# Patient Record
Sex: Female | Born: 1990
Health system: Southern US, Community
[De-identification: ages and names within clinical notes are randomized; demographics above are authoritative.]

## PROBLEM LIST (undated history)

## (undated) ENCOUNTER — Emergency Department (HOSPITAL_COMMUNITY): Admission: EM | Disposition: A | Discharge status: Other Acute Inpatient Hospital | Payer: Medicaid Other

## (undated) ENCOUNTER — Ambulatory Visit (HOSPITAL_COMMUNITY): Admission: EM

## (undated) DIAGNOSIS — K449 Diaphragmatic hernia without obstruction or gangrene: Secondary | ICD-10-CM

## (undated) DIAGNOSIS — R109 Unspecified abdominal pain: Secondary | ICD-10-CM

## (undated) DIAGNOSIS — E669 Obesity, unspecified: Secondary | ICD-10-CM

## (undated) DIAGNOSIS — R011 Cardiac murmur, unspecified: Secondary | ICD-10-CM

## (undated) DIAGNOSIS — N911 Secondary amenorrhea: Secondary | ICD-10-CM

## (undated) DIAGNOSIS — K219 Gastro-esophageal reflux disease without esophagitis: Secondary | ICD-10-CM

## (undated) DIAGNOSIS — J45909 Unspecified asthma, uncomplicated: Secondary | ICD-10-CM

## (undated) DIAGNOSIS — O1490 Unspecified pre-eclampsia, unspecified trimester: Secondary | ICD-10-CM

## (undated) DIAGNOSIS — D649 Anemia, unspecified: Secondary | ICD-10-CM

## (undated) DIAGNOSIS — M25579 Pain in unspecified ankle and joints of unspecified foot: Secondary | ICD-10-CM

## (undated) DIAGNOSIS — Z3009 Encounter for other general counseling and advice on contraception: Secondary | ICD-10-CM

## (undated) DIAGNOSIS — F419 Anxiety disorder, unspecified: Secondary | ICD-10-CM

## (undated) HISTORY — DX: Unspecified abdominal pain: R10.9

## (undated) HISTORY — DX: Anemia, unspecified: D64.9

## (undated) HISTORY — PX: TONSILLECTOMY AND ADENOIDECTOMY: SUR1326

## (undated) HISTORY — DX: Obesity, unspecified: E66.9

## (undated) HISTORY — DX: Gastro-esophageal reflux disease without esophagitis: K21.9

## (undated) HISTORY — PX: ADENOIDECTOMY: SUR15

## (undated) HISTORY — DX: Diaphragmatic hernia without obstruction or gangrene: K44.9

---
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
## 2011-02-17 ENCOUNTER — Ambulatory Visit: Payer: Self-pay | Admitting: Pediatrics

## 2011-02-20 LAB — URINALYSIS, ROUTINE W REFLEX MICROSCOPIC
Bilirubin Urine: NEGATIVE
Nitrite: NEGATIVE
Specific Gravity, Urine: 1.015 (ref 1.005–1.030)
pH: 7 (ref 5.0–8.0)

## 2011-02-20 LAB — CBC
HCT: 30.6 % — ABNORMAL LOW (ref 36.0–46.0)
MCV: 79.2 fL (ref 78.0–100.0)
Platelets: 252 10*3/uL (ref 150–400)
RBC: 3.51 MIL/uL — ABNORMAL LOW (ref 3.87–5.11)
RDW: 20.2 % — ABNORMAL HIGH (ref 11.5–15.5)
WBC: 8.1 10*3/uL (ref 4.0–10.5)
WBC: 8.4 10*3/uL (ref 4.0–10.5)

## 2011-02-20 LAB — URINE MICROSCOPIC-ADD ON

## 2011-02-20 LAB — URINE CULTURE

## 2011-02-26 LAB — PREGNANCY, URINE: Preg Test, Ur: POSITIVE

## 2011-02-27 ENCOUNTER — Ambulatory Visit (INDEPENDENT_AMBULATORY_CARE_PROVIDER_SITE_OTHER): Payer: Medicaid Other | Admitting: Family Medicine

## 2011-02-27 ENCOUNTER — Encounter: Payer: Self-pay | Admitting: Family Medicine

## 2011-02-27 VITALS — BP 110/70 | Ht 63.5 in | Wt 279.0 lb

## 2011-02-27 DIAGNOSIS — Z6841 Body Mass Index (BMI) 40.0 and over, adult: Secondary | ICD-10-CM | POA: Insufficient documentation

## 2011-02-27 DIAGNOSIS — E669 Obesity, unspecified: Secondary | ICD-10-CM | POA: Insufficient documentation

## 2011-02-27 DIAGNOSIS — D649 Anemia, unspecified: Secondary | ICD-10-CM | POA: Insufficient documentation

## 2011-02-27 DIAGNOSIS — R300 Dysuria: Secondary | ICD-10-CM

## 2011-02-27 HISTORY — DX: Body Mass Index (BMI) 40.0 and over, adult: Z684

## 2011-02-27 LAB — CBC
HCT: 32 % — ABNORMAL LOW (ref 36.0–46.0)
Hemoglobin: 10.3 g/dL — ABNORMAL LOW (ref 12.0–15.0)
MCH: 23.5 pg — ABNORMAL LOW (ref 26.0–34.0)
MCHC: 32.2 g/dL (ref 30.0–36.0)
RBC: 4.38 MIL/uL (ref 3.87–5.11)

## 2011-02-27 LAB — POCT URINALYSIS DIPSTICK
Bilirubin, UA: NEGATIVE
Blood, UA: NEGATIVE
Ketones, UA: NEGATIVE
pH, UA: 6.5

## 2011-02-27 LAB — BASIC METABOLIC PANEL
CO2: 25 mEq/L (ref 19–32)
Calcium: 9.1 mg/dL (ref 8.4–10.5)
Chloride: 102 mEq/L (ref 96–112)
Glucose, Bld: 92 mg/dL (ref 70–99)
Sodium: 137 mEq/L (ref 135–145)

## 2011-02-27 LAB — WET PREP, GENITAL
Trich, Wet Prep: NONE SEEN
Yeast Wet Prep HPF POC: NONE SEEN

## 2011-02-27 NOTE — Patient Instructions (Signed)
Please come back in 3-4 weeks for lab follow up.

## 2011-02-28 ENCOUNTER — Encounter: Payer: Self-pay | Admitting: Family Medicine

## 2011-02-28 DIAGNOSIS — R300 Dysuria: Secondary | ICD-10-CM | POA: Insufficient documentation

## 2011-02-28 NOTE — Progress Notes (Signed)
  Subjective:    Patient ID: Veronica [name], female    DOB: 06-Jun-1991, 20 y.o.   MRN: [id number]  HPI This is New Patient visit.   PMH, PSH, FH, SH have been reviewed.  Pt is an 20 y/o G1P1 who presents with her mother.  Her son, Veronica [name] (08/2010), became my patient after he was discharged from newborn nursery.  Pt was followed by Dr Gaynell Face during her pregnancy and has seen him for postpartal care.  Mom is concerned about the dark skin on neck because she has diabetes and has the same skin pigmentation.  Pt states that during her pregnancy her sugars were always normal and she did not have diagnosis of gestational dm.   Mom states that pt has been sleeping a lot during the day.  Besides taking care Veronica she is not doing much else.  Veronica [name] plans to attend [employer] to get her GED, but she has not resumed school at this time.   Currently there is a big stressor in their life:  Veronica' father, Anitra Lauth, would like to have joint custody.  Mom and [name] are concerned that Veronica [name] cannot take care of Veronica since he has ADHD and Bipolar disorder.  Veronica [name] currently lives with his mother.    Dysuria: Pt complains of a few days of dysuria.  No frequency or urgency.  Hematuria.      Review of Systems Pt denies fever, chills, cough, chest pain, abd pain, dysuria, incontinence, depression.      Objective:   Physical Exam   General:  Obese,well-nourished,in no acute distress; alert,appropriate and cooperative throughout examination. Vitals reviewed.  Head:  normocephalic and atraumatic.   Mouth:  Oral mucosa and oropharynx without lesions or exudates.   Neck:  No deformities, masses, or tenderness noted. Lungs:  Normal respiratory effort, chest expands symmetrically. Lungs are clear to auscultation, no crackles or wheezes. Heart:  Normal rate and regular rhythm. S1 and S2 normal without gallop, murmur, click, rub or other extra sounds.   Abdomen:  Obese. Bowel sounds positive,abdomen  soft and non-tender without masses, organomegaly or hernias noted. Msk:  No deformity or scoliosis noted of thoracic or lumbar spine.   Pulses:  +2 bilaterally  Extremities:  No clubbing, cyanosis, edema Skin:  Patches of hyperpigmented (black) skin on bilateral axilla and neck (acanthosis nigricans)    Assessment & Plan:

## 2011-02-28 NOTE — Assessment & Plan Note (Signed)
UA negative

## 2011-02-28 NOTE — Assessment & Plan Note (Addendum)
Strong family history of diabetes and morbid obesity.  Will check Bmet and if glucose is elevated will get A1C.  She was not diabetic during pregnancy. Will also check TSH.  At future visit, I would like to discuss healthy diet and exercise with pt. Consider referral to Dr Gerilyn Pilgrim for entire family as younger sister, Martie Lee Kerrick and Mom are also obese.

## 2011-02-28 NOTE — Assessment & Plan Note (Signed)
Was anemic during pregnancy.  She is not breast feeding.  She was told she needs to take iron supplement but she does not.  Will check CBC today.

## 2011-03-05 ENCOUNTER — Ambulatory Visit (INDEPENDENT_AMBULATORY_CARE_PROVIDER_SITE_OTHER): Payer: Medicaid Other | Admitting: Pediatrics

## 2011-03-05 DIAGNOSIS — K219 Gastro-esophageal reflux disease without esophagitis: Secondary | ICD-10-CM

## 2011-03-17 LAB — COMPREHENSIVE METABOLIC PANEL
ALT: 12 U/L (ref 0–35)
AST: 15 U/L (ref 0–37)
Albumin: 3.4 g/dL — ABNORMAL LOW (ref 3.5–5.2)
Alkaline Phosphatase: 72 U/L (ref 47–119)
Chloride: 105 mEq/L (ref 96–112)
Potassium: 4.1 mEq/L (ref 3.5–5.1)
Sodium: 136 mEq/L (ref 135–145)
Total Bilirubin: 0.3 mg/dL (ref 0.3–1.2)
Total Protein: 7.4 g/dL (ref 6.0–8.3)

## 2011-03-17 LAB — URINALYSIS, ROUTINE W REFLEX MICROSCOPIC
Bilirubin Urine: NEGATIVE
Ketones, ur: NEGATIVE mg/dL
Nitrite: NEGATIVE
pH: 6.5 (ref 5.0–8.0)

## 2011-03-17 LAB — CBC
HCT: 34.4 % — ABNORMAL LOW (ref 36.0–49.0)
Platelets: 372 10*3/uL (ref 150–400)
RDW: 17.1 % — ABNORMAL HIGH (ref 11.4–15.5)
WBC: 7.9 10*3/uL (ref 4.5–13.5)

## 2011-03-17 LAB — DIFFERENTIAL
Basophils Absolute: 0 10*3/uL (ref 0.0–0.1)
Basophils Relative: 0 % (ref 0–1)
Eosinophils Relative: 2 % (ref 0–5)
Monocytes Absolute: 0.4 10*3/uL (ref 0.2–1.2)
Monocytes Relative: 5 % (ref 3–11)

## 2011-03-17 LAB — URINE MICROSCOPIC-ADD ON

## 2011-03-17 LAB — PREGNANCY, URINE: Preg Test, Ur: NEGATIVE

## 2011-03-17 LAB — GLUCOSE, CAPILLARY: Glucose-Capillary: 98 mg/dL (ref 70–99)

## 2011-03-18 ENCOUNTER — Ambulatory Visit: Payer: Medicaid Other | Admitting: Family Medicine

## 2011-03-28 ENCOUNTER — Ambulatory Visit (INDEPENDENT_AMBULATORY_CARE_PROVIDER_SITE_OTHER): Payer: Medicaid Other | Admitting: Family Medicine

## 2011-03-28 ENCOUNTER — Encounter: Payer: Self-pay | Admitting: Family Medicine

## 2011-03-28 ENCOUNTER — Ambulatory Visit: Payer: Medicaid Other | Admitting: Family Medicine

## 2011-03-28 VITALS — BP 124/76 | HR 81 | Temp 97.9°F | Ht 63.0 in | Wt 280.3 lb

## 2011-03-28 DIAGNOSIS — L708 Other acne: Secondary | ICD-10-CM

## 2011-03-28 DIAGNOSIS — R350 Frequency of micturition: Secondary | ICD-10-CM

## 2011-03-28 DIAGNOSIS — L709 Acne, unspecified: Secondary | ICD-10-CM

## 2011-03-28 DIAGNOSIS — D649 Anemia, unspecified: Secondary | ICD-10-CM

## 2011-03-28 DIAGNOSIS — K219 Gastro-esophageal reflux disease without esophagitis: Secondary | ICD-10-CM | POA: Insufficient documentation

## 2011-03-28 DIAGNOSIS — IMO0001 Reserved for inherently not codable concepts without codable children: Secondary | ICD-10-CM | POA: Insufficient documentation

## 2011-03-28 DIAGNOSIS — E669 Obesity, unspecified: Secondary | ICD-10-CM

## 2011-03-28 LAB — POCT URINALYSIS DIPSTICK
Blood, UA: NEGATIVE
Ketones, UA: NEGATIVE
Protein, UA: NEGATIVE
Spec Grav, UA: 1.02
Urobilinogen, UA: 1

## 2011-03-28 LAB — POCT URINE PREGNANCY: Preg Test, Ur: NEGATIVE

## 2011-03-28 MED ORDER — DOCUSATE SODIUM 100 MG PO CAPS: 100.0000 mg | ORAL_CAPSULE | Freq: Two times a day (BID) | ORAL | Status: DC

## 2011-03-28 MED ORDER — FERROUS SULFATE 325 (65 FE) MG PO TABS: 325.0000 mg | ORAL_TABLET | Freq: Two times a day (BID) | ORAL | Status: DC

## 2011-03-28 MED ORDER — BENZOYL PEROXIDE-ERYTHROMYCIN 5-3 % EX GEL: CUTANEOUS | Status: DC

## 2011-03-28 MED ORDER — OXYBUTYNIN CHLORIDE ER 10 MG PO TB24: 10.0000 mg | ORAL_TABLET | Freq: Every day | ORAL | Status: DC

## 2011-03-28 MED ORDER — ESOMEPRAZOLE MAGNESIUM 40 MG PO CPDR: 40.0000 mg | DELAYED_RELEASE_CAPSULE | Freq: Every day | ORAL | Status: DC

## 2011-03-28 NOTE — Patient Instructions (Addendum)
Make appointment with Dr Gerilyn Pilgrim for next Thurs for nutrition. Stop taking Prilosec.  Start taking Nexium for heartburn. Start taking iron twice a day. You can also take Colace twice a day for constipation from the iron. No more juice or sodas, even diet sodas or teas. Walk at the track with Sabrina. Do this everyday. At least 4 times around, that is 1 mile. Drink lots of water. Carry water with you when you go walking.

## 2011-03-28 NOTE — Progress Notes (Signed)
Subjective:    Patient ID: Veronica [name], female    DOB: 02/29/1991, 20 y.o.   MRN: [id number]  HPI GERD: Taking Prilosec, but stating that it does not relieve all her symptoms.  She usually feels burning sensation in chest at night.  Feels that heartburn is worse now that when she was pregnant.  No dysphagia.  No unusual weight loss.  No hemoptysis.  No nausea/vomiting/diarrhea/constipation.  Frequency x 2-4 wks.   Pt had c-section 6 months ago and wonders if that is causing her symptoms.  No dysuria.  No fever/chills.  Some incontinence.  Nocturia almost every night x1 time, around 3AM only.  Voiding every hour. Some leaking when she sneezes.  No abd pain that is different from the usual GERD symptoms she feels.  No saddle anesthesia.   ACNE Worsened in past few weeks.  Lots of lesions on face.  Admits to not washing her face daily.  The lesions are limited to face and neck, not chest or back.  Nonbloody.  ANEMIA Mom states pt is sleeping a lot during the day.  She to lack energy.  We checked labs at last visit and pt is here to review those labs.   No dyspnea, she can walk the same amt as before.  No syncope.    Obesity Pt not sure about what good nutrition means.   Drinking sodas, sometimes they are diet sodas.  Does not know about portion control.    Review of Systems  Constitutional: Positive for fatigue. Negative for fever and chills.  HENT: Negative for nosebleeds.   Respiratory: Negative for cough, choking, chest tightness and wheezing.   Cardiovascular: Negative for chest pain, palpitations and leg swelling.  Gastrointestinal: Negative for nausea, vomiting, abdominal pain, diarrhea, constipation and blood in stool.  Genitourinary: Positive for urgency and frequency. Negative for dysuria, hematuria, flank pain, vaginal bleeding, vaginal discharge, difficulty urinating, genital sores, vaginal pain, menstrual problem and pelvic pain.  Skin: Negative for pallor and wound.    Neurological: Negative for syncope, weakness, light-headedness and headaches.   Per hpi     Objective:   Physical Exam  Constitutional: She is oriented to person, place, and time. She appears well-developed and well-nourished. No distress.  HENT:  Head: Normocephalic and atraumatic.  Neck: Normal range of motion. Neck supple. No thyromegaly present.  Cardiovascular: Normal rate, regular rhythm and normal heart sounds.   No murmur heard. Pulmonary/Chest: Effort normal and breath sounds normal. No respiratory distress. She has no rales.  Abdominal: Soft. Bowel sounds are normal. She exhibits no distension. There is no tenderness. There is no guarding. Hernia confirmed negative in the right inguinal area and confirmed negative in the left inguinal area.       Morbid obesity  Genitourinary: Vagina normal and uterus normal. Rectal exam shows anal tone abnormal. Rectal exam shows no external hemorrhoid, no internal hemorrhoid, no fissure, no mass and no tenderness. There is no rash, tenderness, lesion or injury on the right labia. There is no rash, tenderness, lesion or injury on the left labia. Cervix exhibits no motion tenderness, no discharge and no friability. Right adnexum displays no mass and no tenderness. Left adnexum displays no mass and no tenderness. No erythema, tenderness or bleeding around the vagina. No foreign body around the vagina. No signs of injury around the vagina. No vaginal discharge found.  Musculoskeletal: Normal range of motion.  Lymphadenopathy:       Right: No inguinal adenopathy present.  Left: No inguinal adenopathy present.  Neurological: She is alert and oriented to person, place, and time.  Skin:          Multiple cystic and nodules on face, few white heads. No erythema.           Assessment & Plan:

## 2011-03-29 ENCOUNTER — Encounter: Payer: Self-pay | Admitting: Family Medicine

## 2011-03-30 NOTE — Assessment & Plan Note (Signed)
Pt morbidly obese with poor insight into healthy eating habits.  Pt also not very active.  Will refer to Dr Rema Fendt in Nutrition clinic.

## 2011-03-30 NOTE — Assessment & Plan Note (Signed)
Pt has been taking prilosec, which has not helped.  Will try nexium.  May consider switching to protonix if not improvement.

## 2011-03-30 NOTE — Assessment & Plan Note (Signed)
Etiology of frequency is difficult to determine.  UA in negative for infection.  UPT negative.  Pt had urinary retention with post void residual +from cath exam.  Physical exam showed adequate anal tone, no cystole.  Will try a short course of ditropan to see if that will help her with urinary retention.

## 2011-03-30 NOTE — Assessment & Plan Note (Signed)
Hb 10.3 with MCV 73.  Likely iron def anemia from regular menses.  Will start with FeSO4 bid.  Discussed constipation as s/e.  Will also start colace.

## 2011-03-30 NOTE — Assessment & Plan Note (Signed)
Pt with acne.  Will Rx benzoyl peroxide-erythromycin.  Discussed skin hygiene at length.  Gave similar instructions as ones given to her sister.

## 2011-03-31 ENCOUNTER — Other Ambulatory Visit: Payer: Self-pay | Admitting: Family Medicine

## 2011-03-31 ENCOUNTER — Telehealth: Payer: Self-pay | Admitting: *Deleted

## 2011-03-31 DIAGNOSIS — IMO0001 Reserved for inherently not codable concepts without codable children: Secondary | ICD-10-CM

## 2011-03-31 MED ORDER — BETHANECHOL CHLORIDE 10 MG PO TABS: 10.0000 mg | ORAL_TABLET | Freq: Three times a day (TID) | ORAL | Status: DC

## 2011-03-31 NOTE — Telephone Encounter (Signed)
PA required for oxybutynin. Form placed in MD box. 

## 2011-04-10 ENCOUNTER — Emergency Department (HOSPITAL_BASED_OUTPATIENT_CLINIC_OR_DEPARTMENT_OTHER)
Admission: EM | Admit: 2011-04-10 | Discharge: 2011-04-10 | Disposition: A | Discharge status: Home / Self Care | Payer: Medicaid Other | Attending: Emergency Medicine | Admitting: Emergency Medicine

## 2011-04-10 ENCOUNTER — Emergency Department (INDEPENDENT_AMBULATORY_CARE_PROVIDER_SITE_OTHER): Payer: Medicaid Other

## 2011-04-10 ENCOUNTER — Ambulatory Visit: Payer: Medicaid Other | Admitting: Family Medicine

## 2011-04-10 DIAGNOSIS — M25579 Pain in unspecified ankle and joints of unspecified foot: Secondary | ICD-10-CM

## 2011-04-10 DIAGNOSIS — X500XXA Overexertion from strenuous movement or load, initial encounter: Secondary | ICD-10-CM

## 2011-04-10 DIAGNOSIS — S93409A Sprain of unspecified ligament of unspecified ankle, initial encounter: Secondary | ICD-10-CM | POA: Insufficient documentation

## 2011-04-10 DIAGNOSIS — K219 Gastro-esophageal reflux disease without esophagitis: Secondary | ICD-10-CM | POA: Insufficient documentation

## 2011-04-10 DIAGNOSIS — W19XXXA Unspecified fall, initial encounter: Secondary | ICD-10-CM | POA: Insufficient documentation

## 2011-04-11 ENCOUNTER — Ambulatory Visit (INDEPENDENT_AMBULATORY_CARE_PROVIDER_SITE_OTHER): Payer: Medicaid Other | Admitting: Family Medicine

## 2011-04-11 ENCOUNTER — Encounter: Payer: Self-pay | Admitting: Family Medicine

## 2011-04-11 VITALS — BP 135/73 | HR 82 | Temp 97.8°F | Ht 64.0 in | Wt 281.0 lb

## 2011-04-11 DIAGNOSIS — R350 Frequency of micturition: Secondary | ICD-10-CM

## 2011-04-11 DIAGNOSIS — IMO0001 Reserved for inherently not codable concepts without codable children: Secondary | ICD-10-CM

## 2011-04-11 NOTE — Assessment & Plan Note (Signed)
Frequency and  Urinary retention follow-up exam.  Pt did not start taking bethanecol but has improved since last visit.  Advised her not to pick up the med and to make appt if this happens again.

## 2011-04-11 NOTE — Progress Notes (Signed)
  Subjective:    Patient ID: Veronica [name], female    DOB: Sep 10, 1991, 20 y.o.   MRN: [id number]  HPI Pt is here for f/u of urinary retention.  She was seen two weeks ago and was Rx bethanechol.  Unfortunately she did not pick up this medication.  She states that she has been doing better since last visit.  She is making it to the bathroom on time, without voiding on herself.  She is voiding about q3hr.  She denies urgency, frequency, dysuria, fever/chills.    Review of Systems Per hpi     Objective:   Physical Exam  Constitutional: She is oriented to person, place, and time. She appears well-developed and well-nourished. No distress.  Neurological: She is alert and oriented to person, place, and time.          Assessment & Plan:

## 2011-04-17 ENCOUNTER — Ambulatory Visit: Payer: Medicaid Other | Admitting: Family Medicine

## 2011-04-23 ENCOUNTER — Encounter: Payer: Self-pay | Admitting: *Deleted

## 2011-05-14 ENCOUNTER — Ambulatory Visit (INDEPENDENT_AMBULATORY_CARE_PROVIDER_SITE_OTHER): Payer: Medicaid Other | Admitting: Pediatrics

## 2011-05-14 VITALS — BP 122/75 | HR 80 | Temp 97.8°F | Ht 63.25 in | Wt 277.0 lb

## 2011-05-14 DIAGNOSIS — K219 Gastro-esophageal reflux disease without esophagitis: Secondary | ICD-10-CM

## 2011-05-14 MED ORDER — OMEPRAZOLE 40 MG PO CPDR: 40.0000 mg | DELAYED_RELEASE_CAPSULE | Freq: Every day | ORAL | Status: DC

## 2011-05-14 NOTE — Patient Instructions (Signed)
Continue daily omeprazole. Will refer to [facility] for future care of GERD. They should call in 1-2 months to set up appointment or you can call them at [phone] if you haven't heard from them

## 2011-05-14 NOTE — Progress Notes (Signed)
Subjective:     Patient ID: Veronica [name], female   DOB: 07-12-1991, 20 y.o.   MRN: [id number]  BP 122/75  Pulse 80  Temp(Src) 97.8 F (36.6 C) (Oral)  Ht 5' 3.25" (1.607 m)  Wt 277 lb (125.646 kg)  BMI 48.68 kg/m2  HPI 20 yo female with GERD and obesity. Doing well since last seen2 months ago. Problems if forgets dose of PPI but otherwise doing well. Weight unchanged. Avoiding chocolate, caffeine, peppermint and postprandial recumbency. No respiratory difficulties.  Review of Systems  Constitutional: Negative for activity change, appetite change and unexpected weight change.  HENT: Negative.   Eyes: Negative.   Respiratory: Negative for cough, choking and wheezing.   Cardiovascular: Negative.   Gastrointestinal: Negative for nausea, vomiting, abdominal pain, diarrhea, constipation, abdominal distention and anal bleeding.  Genitourinary: Negative for dysuria and difficulty urinating.  Musculoskeletal: Negative.   Skin: Negative.   Neurological: Negative.   Hematological: Negative.   Psychiatric/Behavioral: Negative.        Objective:   Physical Exam  [nursing notereviewed. Constitutional: She is oriented to person, place, and time. She appears well-developed and well-nourished.  HENT:  Head: Normocephalic.  Eyes: Conjunctivae are normal.  Neck: Normal range of motion. Neck supple. No thyromegaly present.  Cardiovascular: Normal rate, regular rhythm and normal heart sounds.   No murmur heard. Pulmonary/Chest: Effort normal and breath sounds normal.  Abdominal: Soft. Bowel sounds are normal. She exhibits no distension and no mass. There is no tenderness.  Musculoskeletal: Normal range of motion.  Neurological: She is alert and oriented to person, place, and time.  Skin: Skin is warm and dry.  Psychiatric: She has a normal mood and affect. Her behavior is normal.       Assessment:    GERD-well controlled with PPI and diet; off bethanechol since pregnancy concluded  Exogenous obesity-no change in weight.    Plan:    Continue Omeprazole 40 mg daily and dietary restrictions mentioned above.   Refer to [facility] for ongoing care due to advanced age.

## 2011-08-07 ENCOUNTER — Telehealth: Payer: Self-pay | Admitting: *Deleted

## 2011-08-07 NOTE — Telephone Encounter (Signed)
PA required for oxybutynin. Form placed in Dr. Willaim Rayas box , Intern doing PA for Interns this month.

## 2011-08-13 NOTE — Telephone Encounter (Signed)
Dr. Durene Cal reviewed chart and spoke with preceptor and it was decided that patient needs appointment to follow up with Dr. Elwyn Reach about oxubutynin ER. Advised pharmacy that we will not request PA for this med and to tell  patient to call our office. Message left on patient's voicemail also to call us back.

## 2011-08-22 ENCOUNTER — Ambulatory Visit: Payer: Medicaid Other | Admitting: Family Medicine

## 2011-08-26 ENCOUNTER — Ambulatory Visit (INDEPENDENT_AMBULATORY_CARE_PROVIDER_SITE_OTHER): Payer: Medicaid Other | Admitting: Emergency Medicine

## 2011-08-26 ENCOUNTER — Encounter: Payer: Self-pay | Admitting: Emergency Medicine

## 2011-08-26 DIAGNOSIS — Z3009 Encounter for other general counseling and advice on contraception: Secondary | ICD-10-CM

## 2011-08-26 DIAGNOSIS — R51 Headache: Secondary | ICD-10-CM

## 2011-08-26 DIAGNOSIS — IMO0001 Reserved for inherently not codable concepts without codable children: Secondary | ICD-10-CM

## 2011-08-26 DIAGNOSIS — D649 Anemia, unspecified: Secondary | ICD-10-CM

## 2011-08-26 DIAGNOSIS — R200 Anesthesia of skin: Secondary | ICD-10-CM | POA: Insufficient documentation

## 2011-08-26 DIAGNOSIS — R519 Headache, unspecified: Secondary | ICD-10-CM | POA: Insufficient documentation

## 2011-08-26 DIAGNOSIS — K219 Gastro-esophageal reflux disease without esophagitis: Secondary | ICD-10-CM

## 2011-08-26 DIAGNOSIS — Z309 Encounter for contraceptive management, unspecified: Secondary | ICD-10-CM

## 2011-08-26 DIAGNOSIS — R209 Unspecified disturbances of skin sensation: Secondary | ICD-10-CM

## 2011-08-26 HISTORY — DX: Encounter for other general counseling and advice on contraception: Z30.09

## 2011-08-26 LAB — CBC
MCH: 23.6 pg — ABNORMAL LOW (ref 26.0–34.0)
MCHC: 32.6 g/dL (ref 30.0–36.0)
MCV: 72.4 fL — ABNORMAL LOW (ref 78.0–100.0)
Platelets: 441 10*3/uL — ABNORMAL HIGH (ref 150–400)
RDW: 17.9 % — ABNORMAL HIGH (ref 11.5–15.5)

## 2011-08-26 LAB — POCT URINE PREGNANCY: Preg Test, Ur: NEGATIVE

## 2011-08-26 MED ORDER — OMEPRAZOLE 40 MG PO CPDR: 40.0000 mg | DELAYED_RELEASE_CAPSULE | Freq: Every day | ORAL | Status: DC

## 2011-08-26 MED ORDER — NORGESTIMATE-ETH ESTRADIOL 0.25-35 MG-MCG PO TABS: 1.0000 | ORAL_TABLET | Freq: Every day | ORAL | Status: DC

## 2011-08-26 MED ORDER — FERROUS SULFATE 325 (65 FE) MG PO TABS: 325.0000 mg | ORAL_TABLET | Freq: Two times a day (BID) | ORAL | Status: DC

## 2011-08-26 NOTE — Progress Notes (Signed)
  Subjective:    Patient ID: Veronica [name], female    DOB: [birth year], 20 y.o.   MRN: [id number]  HPI Migraines: Patient states has had migraines for many years, however they have become more frequent in the last month or so. She is having headaches every other day.  States ferrous sulfate helps her headaches and would like a refill of her prescription.  Headaches are right-sided and throbbing.  No photophobia or phonophobia, no nausea.  Denies aura.  Abdominal numbness/GERD: states her lower abdomen has been numb since her c-section last year.  Numbness limited to lower abdomen, has good sensation in legs.  No problems with bowel or bladder.  States GERD is well controlled.  As she is 62, her pediatric GI doctor was no longer going to be seeing her.  Birth control: patient would like to start birth control.  Not currently sexually active.  Wants the pill.  No personal or family history of blood clots, she is a non-smoker.  Does has migraine without aura.   Review of Systems See HPI    Objective:   Physical Exam  Constitutional: She is oriented to person, place, and time. She appears well-developed and well-nourished. No distress.  HENT:  Head: Normocephalic and atraumatic.  Mouth/Throat: Oropharynx is clear and moist.  Eyes: No scleral icterus.  Neck: Normal range of motion. Neck supple.  Cardiovascular: Normal rate, regular rhythm, normal heart sounds and intact distal pulses.  Exam reveals no gallop.   No murmur heard. Pulmonary/Chest: Effort normal and breath sounds normal. She has no wheezes. She has no rales.  Abdominal: Soft. Bowel sounds are normal. She exhibits no distension. There is no tenderness. There is no rebound and no guarding.       Decreased sensation in 2cm below umbilicus to pubic bone bilaterally.  Musculoskeletal: She exhibits no edema.  Neurological: She is alert and oriented to person, place, and time.  Skin: Skin is warm and dry. No rash noted.  Psychiatric:  She has a normal mood and affect.      Assessment & Plan:

## 2011-08-26 NOTE — Assessment & Plan Note (Signed)
No red flags on history or exam.  Discussed with her likely caused by severing of nerves during the c-section and giving that it has been 12 months, she is unlikely to regain sensation.

## 2011-08-26 NOTE — Assessment & Plan Note (Addendum)
Will check a urine pregnancy test (which was negative) and prescribe sprintec.  Discussed options at length with patient and emphasized the importance of taking the pill at the same time every day.

## 2011-08-26 NOTE — Assessment & Plan Note (Addendum)
Currently well controlled.  Would like a referral to an adult GI doctor, however after discussion has decided to have me manage her GERD for now.  Will refill her omeprazole.

## 2011-08-26 NOTE — Patient Instructions (Signed)
It was wonderful to meet you!  I have refilled your ferrous sulfate and omeprazole prescriptions.  I have also sent a prescription for oral contraceptives to your pharmacy.  Remember, it is important to take the pill at the SAME TIME every day - set an alarm if you need to!    I will see you back in 1 year for an annual exam or sooner if you have any concerns.

## 2011-08-26 NOTE — Assessment & Plan Note (Signed)
Currently having headaches every other day.  States that ferrous sulfate helps with the headaches.  Will refill her iron prescription and check a CBC today.

## 2011-08-28 ENCOUNTER — Encounter: Payer: Self-pay | Admitting: Gastroenterology

## 2011-09-01 LAB — WET PREP, GENITAL: Trich, Wet Prep: NONE SEEN

## 2011-09-01 LAB — POCT URINALYSIS DIP (DEVICE)
Bilirubin Urine: NEGATIVE
Hgb urine dipstick: NEGATIVE
Nitrite: NEGATIVE
Protein, ur: NEGATIVE
Urobilinogen, UA: 0.2
pH: 7

## 2011-09-08 LAB — URINALYSIS, ROUTINE W REFLEX MICROSCOPIC
Hgb urine dipstick: NEGATIVE
Ketones, ur: NEGATIVE
Protein, ur: NEGATIVE
Urobilinogen, UA: 1

## 2011-09-08 LAB — URINE CULTURE

## 2011-09-08 LAB — URINE MICROSCOPIC-ADD ON

## 2011-09-08 LAB — RAPID URINE DRUG SCREEN, HOSP PERFORMED: Cocaine: NOT DETECTED

## 2011-09-08 LAB — POCT PREGNANCY, URINE: Preg Test, Ur: NEGATIVE

## 2011-09-10 LAB — URINE CULTURE: Colony Count: >=100000

## 2011-09-10 LAB — CBC
HCT: 34.9 — ABNORMAL LOW
Hemoglobin: 11.4 — ABNORMAL LOW
RBC: 4.42
WBC: 6.9

## 2011-09-10 LAB — URINE MICROSCOPIC-ADD ON

## 2011-09-10 LAB — URINALYSIS, ROUTINE W REFLEX MICROSCOPIC
Bilirubin Urine: NEGATIVE
Glucose, UA: NEGATIVE
Ketones, ur: NEGATIVE
Nitrite: NEGATIVE
Specific Gravity, Urine: 1.023
Urobilinogen, UA: 1
pH: 6.5

## 2011-09-10 LAB — COMPREHENSIVE METABOLIC PANEL
ALT: 14
Alkaline Phosphatase: 74
BUN: 7
CO2: 26
Chloride: 104
Creatinine, Ser: 0.93
Glucose, Bld: 93
Potassium: 4
Sodium: 136
Total Bilirubin: 0.7

## 2011-09-10 LAB — DIFFERENTIAL
Basophils Absolute: 0
Basophils Relative: 1
Eosinophils Absolute: 0.2
Monocytes Absolute: 0.3
Monocytes Relative: 4
Neutro Abs: 4.2
Neutrophils Relative %: 61

## 2011-09-11 LAB — DIFFERENTIAL
Basophils Absolute: 0 10*3/uL (ref 0.0–0.1)
Basophils Relative: 0 % (ref 0–1)
Neutro Abs: 8.5 10*3/uL — ABNORMAL HIGH (ref 1.7–8.0)
Neutrophils Relative %: 85 % — ABNORMAL HIGH (ref 43–71)

## 2011-09-11 LAB — URINALYSIS, ROUTINE W REFLEX MICROSCOPIC
Ketones, ur: NEGATIVE mg/dL
Nitrite: NEGATIVE
Protein, ur: NEGATIVE mg/dL

## 2011-09-11 LAB — COMPREHENSIVE METABOLIC PANEL
Alkaline Phosphatase: 69 U/L (ref 47–119)
BUN: 8 mg/dL (ref 6–23)
Chloride: 103 mEq/L (ref 96–112)
Glucose, Bld: 107 mg/dL — ABNORMAL HIGH (ref 70–99)
Potassium: 3.8 mEq/L (ref 3.5–5.1)
Total Bilirubin: 0.7 mg/dL (ref 0.3–1.2)
Total Protein: 7.9 g/dL (ref 6.0–8.3)

## 2011-09-11 LAB — POCT PREGNANCY, URINE: Preg Test, Ur: NEGATIVE

## 2011-09-11 LAB — CBC
MCHC: 33 g/dL (ref 31.0–37.0)
MCV: 79.3 fL (ref 78.0–98.0)
Platelets: 368 10*3/uL (ref 150–400)
RBC: 4.61 MIL/uL (ref 3.80–5.70)
WBC: 10.1 10*3/uL (ref 4.5–13.5)

## 2011-09-11 LAB — LIPASE, BLOOD: Lipase: 18 U/L (ref 11–59)

## 2011-09-16 ENCOUNTER — Encounter: Payer: Self-pay | Admitting: *Deleted

## 2011-09-18 LAB — WET PREP, GENITAL
Clue Cells Wet Prep HPF POC: NONE SEEN
Trich, Wet Prep: NONE SEEN

## 2011-09-18 LAB — GC/CHLAMYDIA PROBE AMP, GENITAL: GC Probe Amp, Genital: NEGATIVE

## 2011-09-18 LAB — POCT URINALYSIS DIP (DEVICE)
Hgb urine dipstick: NEGATIVE
Ketones, ur: NEGATIVE
Protein, ur: NEGATIVE
Specific Gravity, Urine: 1.01
Urobilinogen, UA: 0.2

## 2011-09-18 LAB — POCT PREGNANCY, URINE: Operator id: 235561

## 2011-10-20 ENCOUNTER — Encounter: Payer: Self-pay | Admitting: Emergency Medicine

## 2011-10-20 ENCOUNTER — Ambulatory Visit (INDEPENDENT_AMBULATORY_CARE_PROVIDER_SITE_OTHER): Payer: Medicaid Other | Admitting: Emergency Medicine

## 2011-10-20 VITALS — BP 117/74 | HR 88 | Temp 98.4°F | Ht 64.0 in | Wt 267.0 lb

## 2011-10-20 DIAGNOSIS — N949 Unspecified condition associated with female genital organs and menstrual cycle: Secondary | ICD-10-CM

## 2011-10-20 DIAGNOSIS — K219 Gastro-esophageal reflux disease without esophagitis: Secondary | ICD-10-CM

## 2011-10-20 DIAGNOSIS — Z0000 Encounter for general adult medical examination without abnormal findings: Secondary | ICD-10-CM | POA: Insufficient documentation

## 2011-10-20 DIAGNOSIS — Z23 Encounter for immunization: Secondary | ICD-10-CM

## 2011-10-20 DIAGNOSIS — R102 Pelvic and perineal pain: Secondary | ICD-10-CM

## 2011-10-20 DIAGNOSIS — N760 Acute vaginitis: Secondary | ICD-10-CM

## 2011-10-20 DIAGNOSIS — R300 Dysuria: Secondary | ICD-10-CM

## 2011-10-20 LAB — POCT WET PREP (WET MOUNT)
Clue Cells Wet Prep HPF POC: NEGATIVE
Trichomonas Wet Prep HPF POC: NEGATIVE
Yeast Wet Prep HPF POC: NEGATIVE

## 2011-10-20 LAB — POCT URINALYSIS DIPSTICK
Ketones, UA: NEGATIVE
Leukocytes, UA: NEGATIVE
Urobilinogen, UA: 1
pH, UA: 6.5

## 2011-10-20 LAB — POCT URINE PREGNANCY: Preg Test, Ur: NEGATIVE

## 2011-10-20 MED ORDER — LIDOCAINE 5 % EX OINT: TOPICAL_OINTMENT | CUTANEOUS | Status: DC | PRN

## 2011-10-20 MED ORDER — OMEPRAZOLE 40 MG PO CPDR: 40.0000 mg | DELAYED_RELEASE_CAPSULE | Freq: Every day | ORAL | Status: DC

## 2011-10-20 NOTE — Assessment & Plan Note (Addendum)
UA negative for infection.  Uprep is negative.  Will check GC/Chlamydia and wet prep.  Patient describes it as being more in her c-section incision.  Instructed to take tylenol/motrin for pain control.  Can do massage with oil for possible scar tissue.  Will provide prescription for lidocaine cream to apply to the scar.

## 2011-10-20 NOTE — Progress Notes (Signed)
  Subjective:    Patient ID: Veronica [name], female    DOB: 04/15/1991, 20 y.o.   MRN: [id number]  HPI Ms. [place] is here for evaluation of incisional pain and medication refill.  Incisional Pain: Pt states that her c-section scar has been hurting for the last 3-4 days.  Describes the pain as a constant pulling sensation.  Present all day; has woken her from sleep.  Sometimes it is so bad she has to lie down.  With this pain, she also complains of dysuria, frequency and urgency over the same time frame.  Denies diarrhea, n/v, f/c. Denies vaginal discharge, itching, burning.  No recent sex or new partners.  LMP 11/6.   States has never had a UTI before.  Also requesting refill of her omeprazole for reflux.  Review of Systems See HPI, otherwise negative    Objective:   Physical Exam Filed Vitals:   10/20/11 1420  BP: 117/74  Pulse: 88  Temp: 98.4 F (36.9 C)   General: obese, NAD, alert Abd: no erythema or swelling present; +BS; soft, non-distended; tender in the suprapubic region Back: no CVA tenderness Pelvic: normal external genitalia; no discharge present in vagina; normal cervix; no cervical motion tenderness; unable to palpate uterus or adnexa secondary to body habitus  Urine dipstick shows negative for nitrites, leukocytes, positive for protein. Uprep: negative    Assessment & Plan:

## 2011-10-20 NOTE — Assessment & Plan Note (Signed)
Stable on medications.  Will refill omeprazole.

## 2011-10-20 NOTE — Patient Instructions (Addendum)
It was nice to see you again!  I have sent another prescription for your omeprazole to the pharmacy.  You should be able to pick up the prescription this afternoon.  For the incisional pain - I have sent a prescription for a lidocaine cream to your pharmacy.  Use this 2-3x/day.  Please do massage along the scar to breakup any scar tissue that may be present.  You can take ibuprofen 600mg  every 8hrs for the pain.  If the pain has not improved in the next 5-7 days, please call the office.

## 2011-10-20 NOTE — Assessment & Plan Note (Signed)
Will get flu shot and TDap today.  Does not need a pap until turns 22.

## 2011-11-04 ENCOUNTER — Other Ambulatory Visit: Payer: Medicaid Other

## 2011-11-04 ENCOUNTER — Other Ambulatory Visit: Payer: Self-pay | Admitting: Emergency Medicine

## 2011-11-04 DIAGNOSIS — Z20828 Contact with and (suspected) exposure to other viral communicable diseases: Secondary | ICD-10-CM

## 2011-11-04 DIAGNOSIS — Z0000 Encounter for general adult medical examination without abnormal findings: Secondary | ICD-10-CM

## 2011-11-04 NOTE — Progress Notes (Signed)
hiv done today [facility] [place]

## 2011-11-04 NOTE — Progress Notes (Signed)
Patient describes sexual intercourse with a man last week.  Has since heard rumors that he is HIV positive.  Would like to get tested.  Will check HIV antibody today and again in 2 months.

## 2011-11-06 ENCOUNTER — Telehealth: Payer: Self-pay | Admitting: Emergency Medicine

## 2011-11-06 NOTE — Telephone Encounter (Signed)
Called Veronica [name] to discuss results of HIV test.  Informed her that I would like to repeat the test in about 2 months as the antibody can take some time to appear.  She was agreeable with this plan.

## 2011-12-28 ENCOUNTER — Telehealth: Payer: Self-pay | Admitting: Family Medicine

## 2011-12-28 NOTE — Telephone Encounter (Signed)
Patient's mom calls because Veronica [name] has a cyst or boil that is draining.  The boil is draining some yellow fluid.  It is between her c-section scar and her belly button.  Her c-section scar is not open.  Pt has had subjective fevers but has not taken temp.  Eating, drinking, urinating OK.  Advised pt should be seen either tomorrow morning for work in visit or, if the pain is bad or she is feeling ill, feverish, she should come to ED tonight.

## 2012-01-02 ENCOUNTER — Other Ambulatory Visit: Payer: Medicaid Other

## 2012-01-02 ENCOUNTER — Telehealth: Payer: Self-pay | Admitting: *Deleted

## 2012-01-02 NOTE — Telephone Encounter (Signed)
Tried to call pt to reschedule appt due to inclement weather.phone numbers listed not working [employer]

## 2012-01-06 ENCOUNTER — Encounter: Payer: Self-pay | Admitting: Emergency Medicine

## 2012-01-06 ENCOUNTER — Ambulatory Visit (INDEPENDENT_AMBULATORY_CARE_PROVIDER_SITE_OTHER): Payer: Medicaid Other | Admitting: Emergency Medicine

## 2012-01-06 ENCOUNTER — Other Ambulatory Visit: Payer: Medicaid Other

## 2012-01-06 DIAGNOSIS — L0293 Carbuncle, unspecified: Secondary | ICD-10-CM

## 2012-01-06 DIAGNOSIS — R109 Unspecified abdominal pain: Secondary | ICD-10-CM | POA: Insufficient documentation

## 2012-01-06 DIAGNOSIS — R51 Headache: Secondary | ICD-10-CM

## 2012-01-06 DIAGNOSIS — L0292 Furuncle, unspecified: Secondary | ICD-10-CM | POA: Insufficient documentation

## 2012-01-06 DIAGNOSIS — Z20828 Contact with and (suspected) exposure to other viral communicable diseases: Secondary | ICD-10-CM

## 2012-01-06 HISTORY — DX: Unspecified abdominal pain: R10.9

## 2012-01-06 MED ORDER — FLUCONAZOLE 150 MG PO TABS: 150.0000 mg | ORAL_TABLET | Freq: Once | ORAL | Status: AC

## 2012-01-06 MED ORDER — SULFAMETHOXAZOLE-TRIMETHOPRIM 800-160 MG PO TABS: 1.0000 | ORAL_TABLET | Freq: Two times a day (BID) | ORAL | Status: AC

## 2012-01-06 NOTE — Progress Notes (Signed)
Repeat hiv done today [facility] Veronica [name]

## 2012-01-06 NOTE — Assessment & Plan Note (Signed)
Unclear etiology.  No red flags of weight loss, blood in stool or fevers.  Will have patient keep a diary of pain episodes with any possible triggers.  Will obtain urine, cbc, and cmet.  Advised tylenol or motrin for pain control.  Discussed avoiding other pain medications due to possible constipation and worsening of pain.

## 2012-01-06 NOTE — Assessment & Plan Note (Addendum)
Patient with recurrent headaches that respond to tylenol. Neurological exam is normal.  Patient to keep headache journal to try and identify any food triggers.

## 2012-01-06 NOTE — Progress Notes (Signed)
  Subjective:    Patient ID: Veronica [name], female    DOB: 1991/07/27, 21 y.o.   MRN: [id number]  HPI Veronica [name] is here today for several concerns.  1. Stomach Ache: This has been going on since the birth of her son by c-section about 16 months ago, but has gotten worse in the last month.  The pain is located in the lower abdomen, but poorly localized.  Described as achy and her stomach feeling "heavy" and like "it's pulling me down."  The pain typically last 4-5 days then resolves for a couple of days then returns.  It does make it difficult for her to sleep.  States "holding her belly" makes in better and too much movement makes it worse.  Has not tried any medications.  Does not idenitfy any triggers.  Reports daily, normal stools.  Denies fever, diarrhea, weight loss, bloody stools.  2. Headaches: Occur 2x/week.  Typically last about 1 hour, resolve with tylenol.  Located in temples, throbbing/dull in nature, sometimes with nausea, some phonophobia.  Thinks may be triggered by foods or soda.  3. Skin boils: Has had 3 skin boils on underside of pannus that were draining a white/yellow fluid.  Two the of the three have completely resolved, one is still present, but improved.   Review of Systems See HPI    Objective:   Physical Exam Vitals: Reviewed General: alert, NAD, cooperative HEENT: AT/Joes Abd: +BS, soft, obese, mild tenderness to palpation in lower quadrants, no rebound or guarding Skin: healing boil on underside of pannus; small tender papule on right side of c-section scar with no fluctuance or drainage Neuro: alert, oriented, PERRL, CN II-XII intact, strength 5/5 and symmetric in all extremities, DTRs 1+ and symmetric       Assessment & Plan:   Boils: Will give Bactrim DS BID x7 days.  Also provided prescription for Diflucan as she has a history of yeast infections after antibiotics.

## 2012-01-06 NOTE — Patient Instructions (Signed)
I'm sorry you're having stomach pain.   Right now, we don't have a clear reason for the pain.  On the positive side, it does not sound like anything life threatening as it has been present for quite some time.  I would like for you to keep a diary of the abdominal pain, keeping track of when it starts, how long it lasts, anything that makes it better, and the foods/activities you were doing right before it starts.  To try and help the pain, you can take tylenol or motrin.  Please do the same for your headaches.  I know it's a lot of writing and recording, but it will give [place] a lot of information.  For the skin boils, I am going to give a prescription for bactrim.  You will take this for 7 days.  I will also give a prescription for diflucan (this is for yeast infection); please take this AFTER the antibiotics.  I will see you back in about 1 month to review your stomach and headache diaries.

## 2012-01-07 LAB — HIV ANTIBODY (ROUTINE TESTING W REFLEX): HIV: NONREACTIVE

## 2012-01-11 ENCOUNTER — Telehealth: Payer: Self-pay | Admitting: Family Medicine

## 2012-01-11 NOTE — Telephone Encounter (Signed)
Received call from patients mother that patient was not feeling good.  States she has been refusing to talk since this am.  When I asked why her mom says "I don't know"  Mom quite hysterical saying that Dystany's blood pressure was up to 140/90.  She also had some mild worsening of abdominal pain.  Finally spoke with Cala Bradford on the phone.  She is hoarse sounding and complains of severe throat pain.  Also with diffuse abdominal pain, somewhat worse.   Denies any swelling of her neck or shortness of breath. Denies fever, chills.  Seen on 1/29 for abdominal pain and headache.  Advised to try gargling warm salt water, try tylenol for pain.   Advised to f/u in am for re-eval since new and worsening symptoms.  Advised of red flags that should prompt emergency care, including difficulty breathing, change in mental status, etc.  Mother and patient agree, will call in am for appt.

## 2012-01-19 ENCOUNTER — Encounter (HOSPITAL_COMMUNITY): Payer: Self-pay

## 2012-01-19 ENCOUNTER — Emergency Department (HOSPITAL_COMMUNITY)
Admission: EM | Admit: 2012-01-19 | Discharge: 2012-01-19 | Disposition: A | Discharge status: Home / Self Care | Payer: Medicaid Other | Attending: Emergency Medicine | Admitting: Emergency Medicine

## 2012-01-19 DIAGNOSIS — S01119A Laceration without foreign body of unspecified eyelid and periocular area, initial encounter: Secondary | ICD-10-CM | POA: Insufficient documentation

## 2012-01-19 DIAGNOSIS — K219 Gastro-esophageal reflux disease without esophagitis: Secondary | ICD-10-CM | POA: Insufficient documentation

## 2012-01-19 DIAGNOSIS — W1809XA Striking against other object with subsequent fall, initial encounter: Secondary | ICD-10-CM | POA: Insufficient documentation

## 2012-01-19 DIAGNOSIS — S0510XA Contusion of eyeball and orbital tissues, unspecified eye, initial encounter: Secondary | ICD-10-CM | POA: Insufficient documentation

## 2012-01-19 DIAGNOSIS — Z79899 Other long term (current) drug therapy: Secondary | ICD-10-CM | POA: Insufficient documentation

## 2012-01-19 MED ORDER — FLUORESCEIN SODIUM 1 MG OP STRP
1.0000 | ORAL_STRIP | Freq: Once | OPHTHALMIC | Status: DC
  Filled 2012-01-19: qty 1

## 2012-01-19 MED ORDER — PROPARACAINE HCL 0.5 % OP SOLN
2.0000 [drp] | Freq: Once | OPHTHALMIC | Status: AC
  Administered 2012-01-19: 2 [drp] via OPHTHALMIC
  Filled 2012-01-19 (×2): qty 15

## 2012-01-19 MED ORDER — ACETAMINOPHEN 325 MG PO TABS
650.0000 mg | ORAL_TABLET | Freq: Once | ORAL | Status: AC
  Administered 2012-01-19: 650 mg via ORAL
  Filled 2012-01-19: qty 2

## 2012-01-19 NOTE — ED Notes (Signed)
Rt. Eye injury from a fall this afternoon .  Pt. Has a lace. On her rt. Eye lid.  Bleeding controlled, denies any LOC.  Cut her eye on rt. On a dirty hook . Pt. Is having blurred vision.

## 2012-01-19 NOTE — ED Notes (Signed)
The pt tripped and fell into some hooks on her mailbox.  Lac to the rt eyelid no active bleeding. At present

## 2012-01-19 NOTE — ED Notes (Signed)
The  Lac has been washed with soap and water and a clean bandage has been applied

## 2012-01-19 NOTE — ED Provider Notes (Signed)
History     CSN: [id number]  Arrival date & time 01/19/12  1526   First MD Initiated Contact with Patient 01/19/12 1824      Chief Complaint  Patient presents with  . Eye Injury    (Consider location/radiation/quality/duration/timing/severity/associated sxs/prior treatment) Patient is a 21 y.o. female presenting with eye injury. The history is provided by the patient.  Eye Injury This is a new problem. The current episode started today. The problem occurs constantly. Pertinent negatives include no chills or fever. Associated symptoms comments: She lost her balance and hit her right eye against the mailbox causing a laceration to eye lid. No LOC. She complains of mild blurred vision in the right. . The symptoms are aggravated by nothing. She has tried nothing for the symptoms.    Past Medical History  Diagnosis Date  . GERD (gastroesophageal reflux disease)   . Obesity (BMI 30-39.9)     Past Surgical History  Procedure Date  . Cesarean section 09/02/2010    For macrosomia, but son was 7 lb 12 oz    Family History  Problem Relation Age of Onset  . Diabetes Mother   . Hypertension Mother   . Obesity Mother   . Obesity Sister   . Osteochondroma Sister     History  Substance Use Topics  . Smoking status: Never Smoker   . Smokeless tobacco: Never Used  . Alcohol Use: No    OB History    Grav Para Term Preterm Abortions TAB SAB Ect Mult Living                  Review of Systems  Constitutional: Negative for fever and chills.  HENT: Negative.   Eyes: Positive for pain. Negative for discharge.  Skin: Positive for wound.    Allergies  Review of patient's allergies indicates no known allergies.  Home Medications   Current Outpatient Rx  Name Route Sig Dispense Refill  . BENZOYL PEROXIDE-ERYTHROMYCIN 5-3 % EX GEL  Apply to affected area 2 times daily 47 g 0  . DOCUSATE SODIUM 100 MG PO CAPS Oral Take 1 capsule (100 mg total) by mouth 2 (two) times daily. 60  capsule 6    For constipation.  Marland Kitchen FERROUS SULFATE 325 (65 FE) MG PO TABS Oral Take 1 tablet (325 mg total) by mouth 2 (two) times daily. 60 tablet 6    For anemia.  Marland Kitchen LIDOCAINE 5 % EX OINT Topical Apply topically as needed. Do not use more than 3x/day. 35.44 g 0  . NORGESTIMATE-ETH ESTRADIOL 0.25-35 MG-MCG PO TABS Oral Take 1 tablet by mouth daily. 1 Package 11  . OMEPRAZOLE 40 MG PO CPDR Oral Take 1 capsule (40 mg total) by mouth daily. 30 capsule 11    BP 101/52  Pulse 60  Temp(Src) 97.8 F (36.6 C) (Oral)  Resp 17  SpO2 100%  LMP 01/05/2012  Physical Exam  Constitutional: She is oriented to person, place, and time. She appears well-developed and well-nourished.  Eyes: Conjunctivae and EOM are normal. Pupils are equal, round, and reactive to light. No foreign body present in the right eye. Right conjunctiva is not injected.  Slit lamp exam:      The right eye shows no corneal abrasion.  Neck: Normal range of motion.  Pulmonary/Chest: Effort normal.  Neurological: She is alert and oriented to person, place, and time.  Skin: Skin is warm and dry.    ED Course  Procedures (including critical care time)  Labs  Reviewed - No data to display No results found.   No diagnosis found.    MDM  LACERATION REPAIR Performed by: Rodena Medin Authorized by: Langley Adie A Consent: Verbal consent obtained. Risks and benefits: risks, benefits and alternatives were discussed Consent given by: patient Patient identity confirmed: provided demographic data Prepped and Draped in normal sterile fashion Wound explored  Laceration Location: right upper eye lid   Laceration Length: 2cm  No Foreign Bodies seen or palpated  Anesthesia: local infiltration  Local anesthetic: lidocaine none% none epinephrine  Anesthetic total:  ml  Irrigation method: syringe Amount of cleaning: standard  Skin closure: dermabond  Number of sutures: 0  Technique: tissue glue  Patient  tolerance: Patient tolerated the procedure well with no immediate complications.         Rodena Medin, [name] 01/19/12 1953

## 2012-01-20 NOTE — ED Provider Notes (Signed)
Medical screening examination/treatment/procedure(s) were performed by non-physician practitioner and as supervising physician I was immediately available for consultation/collaboration.  Nicholes Stairs, MD 01/20/12 1103

## 2012-01-26 ENCOUNTER — Encounter: Payer: Self-pay | Admitting: Family Medicine

## 2012-01-26 ENCOUNTER — Ambulatory Visit (INDEPENDENT_AMBULATORY_CARE_PROVIDER_SITE_OTHER): Payer: Medicaid Other | Admitting: Family Medicine

## 2012-01-26 VITALS — BP 109/70 | HR 70 | Temp 98.2°F | Ht 64.0 in | Wt 263.5 lb

## 2012-01-26 DIAGNOSIS — S058X9A Other injuries of unspecified eye and orbit, initial encounter: Secondary | ICD-10-CM

## 2012-01-26 DIAGNOSIS — K219 Gastro-esophageal reflux disease without esophagitis: Secondary | ICD-10-CM

## 2012-01-26 DIAGNOSIS — S01111A Laceration without foreign body of right eyelid and periocular area, initial encounter: Secondary | ICD-10-CM

## 2012-01-26 MED ORDER — OMEPRAZOLE 40 MG PO CPDR: 40.0000 mg | DELAYED_RELEASE_CAPSULE | Freq: Every day | ORAL | Status: DC

## 2012-01-26 NOTE — Patient Instructions (Signed)
Thank you for coming in today. Please follow up with Dr. Elwyn Reach in 1-2 months.  If you eye is still bothering your in 1 week call back and we will set you up with the eye doctor.  If you have badly blurred vision, or eye pain call us ASAP.  Your weight is not healthy, please see Dr. Elwyn Reach.

## 2012-01-29 DIAGNOSIS — S01111A Laceration without foreign body of right eyelid and periocular area, initial encounter: Secondary | ICD-10-CM | POA: Insufficient documentation

## 2012-01-29 NOTE — Assessment & Plan Note (Signed)
Well healing superficial laceration. Plan for conservative watchful waiting. Followup with primary care doctor as needed. Discussed warning signs such as pain with movement of the eye or worsening vision.

## 2012-01-29 NOTE — Progress Notes (Signed)
Patient ID: Veronica [name], female   DOB: 02-17-1991, 21 y.o.   MRN: [id number] [name] is a 21 y.o. female who presents to [facility] today for followup emergency room visit. On February 11 she's tripped and fell into a exposed hook which caused a superficial laceration of her right upper eyelid. This is evaluated in the emergency room and closed with Dermabond. Since then she experiences occasional pain along her orbit but has no blurry vision or decreased visual acuity. Additionally she does not have pain with movement of her eye.  She feels well otherwise   PMH reviewed. Obesity ROS as above otherwise neg Medications reviewed. Current Outpatient Prescriptions  Medication Sig Dispense Refill  . norgestimate-ethinyl estradiol (SPRINTEC 28) 0.25-35 MG-MCG tablet Take 1 tablet by mouth daily.  1 Package  11  . omeprazole (PRILOSEC) 40 MG capsule Take 1 capsule (40 mg total) by mouth daily.  30 capsule  11    Exam:  BP 109/70  Pulse 70  Temp(Src) 98.2 F (36.8 C) (Oral)  Ht 5\' 4"  (1.626 m)  Wt 263 lb 8 oz (119.523 kg)  BMI 45.23 kg/m2  LMP 01/05/2012 Gen: Well NAD HEENT: EOMI,  MMM, normal pupillary light response.  Small well healing superficial laceration of the upper eyelid with no surrounding erythema. Nontender around orbit.  No erythema or conjunctival injection of the eye itself.

## 2012-02-06 ENCOUNTER — Encounter: Payer: Self-pay | Admitting: Emergency Medicine

## 2012-02-06 ENCOUNTER — Ambulatory Visit (INDEPENDENT_AMBULATORY_CARE_PROVIDER_SITE_OTHER): Payer: Medicaid Other | Admitting: Emergency Medicine

## 2012-02-06 DIAGNOSIS — R109 Unspecified abdominal pain: Secondary | ICD-10-CM

## 2012-02-06 LAB — CBC
HCT: 31.4 % — ABNORMAL LOW (ref 36.0–46.0)
MCHC: 31.8 g/dL (ref 30.0–36.0)
MCV: 73 fL — ABNORMAL LOW (ref 78.0–100.0)
Platelets: 396 10*3/uL (ref 150–400)
RDW: 17.1 % — ABNORMAL HIGH (ref 11.5–15.5)

## 2012-02-06 LAB — COMPREHENSIVE METABOLIC PANEL
ALT: 9 U/L (ref 0–35)
AST: 8 U/L (ref 0–37)
Alkaline Phosphatase: 66 U/L (ref 39–117)
BUN: 13 mg/dL (ref 6–23)
Creat: 0.88 mg/dL (ref 0.50–1.10)

## 2012-02-06 MED ORDER — HYOSCYAMINE SULFATE 0.125 MG PO TABS: 0.1250 mg | ORAL_TABLET | ORAL | Status: DC | PRN

## 2012-02-06 NOTE — Assessment & Plan Note (Signed)
Continues to have pain.  Likely IBS given relief with defecation and non-specific nature.  Liver and gallbladder disease unlikely given location and no association with food.  IBD is a possibility, but less likely with no blood in stool.  Obstruction unlikely with no vomiting.  Abscess unlikely with no fevers.  Will start hyoscyamine 0.125mg  q4hr prn and check CBC, Cmet, and ferritin.  If not improved in 2 weeks, will refer to GI.  Discussed plan and warning signs with patient; expresses understanding and agreement.

## 2012-02-06 NOTE — Progress Notes (Signed)
  Subjective:    Patient ID: Veronica [name], female    DOB: 09-13-1991, 21 y.o.   MRN: [id number]  HPI Veronica [name] is here today to follow up her abdominal pain.  She continues to have crampy/achy lower abdominal pain.  This will typically last for 3 days, then resolved for 1 day, then return.  Finds relief with certain positions and "holding her belly."  Has daily bowel movements that she describes as loose to watery.  Does have pain with defecation, but relief of abdominal pain following defecation.  Pain is not associated with food, no fevers, no blood in stool, no vomiting, no early satiety, no weight loss.  Pain reaches 7/10 in intensity.  No pain currently.  Would like referral to GI.  Review of Systems See HPI, otherwise negative    Objective:   Physical Exam  Constitutional: She is oriented to person, place, and time. She appears well-developed. No distress.       Obese   Abdominal: Soft. Bowel sounds are normal. There is tenderness (mild in lower quadrants). There is no rebound and no guarding.       Rectal exam normal.  Neurological: She is alert and oriented to person, place, and time.      Assessment & Plan:

## 2012-02-06 NOTE — Patient Instructions (Addendum)
I'm sorry you still have belly pain.  I have sent a prescription to your pharmacy for Levsin.  You can take this every 4 hours for stomach cramps or loose stools.  We also did some blood work, I will call you if anything is wrong.  If the pain is not better after 2 weeks of using this medicine, call the clinic, and I will put in the GI referral.  WARNING SIGNS: blood in stool, fevers, vomiting, pain in upper abdomen. **if any of these are present, come back to clinic right away**

## 2012-02-11 ENCOUNTER — Encounter: Payer: Self-pay | Admitting: Emergency Medicine

## 2012-02-17 ENCOUNTER — Ambulatory Visit: Payer: Medicaid Other | Admitting: Emergency Medicine

## 2012-03-03 ENCOUNTER — Telehealth: Payer: Self-pay | Admitting: Emergency Medicine

## 2012-03-03 ENCOUNTER — Encounter: Payer: Self-pay | Admitting: Family Medicine

## 2012-03-03 ENCOUNTER — Ambulatory Visit (INDEPENDENT_AMBULATORY_CARE_PROVIDER_SITE_OTHER): Payer: Medicaid Other | Admitting: Family Medicine

## 2012-03-03 VITALS — BP 150/75 | HR 91 | Temp 98.4°F | Ht 64.0 in | Wt 268.0 lb

## 2012-03-03 DIAGNOSIS — J029 Acute pharyngitis, unspecified: Secondary | ICD-10-CM

## 2012-03-03 DIAGNOSIS — J069 Acute upper respiratory infection, unspecified: Secondary | ICD-10-CM

## 2012-03-03 NOTE — Patient Instructions (Signed)
Thank you for coming in today. You have a cold.  Take 2 tylenol every 6 hours a needed for pain.  Your blood pressure is high.  Please come back next week for a nurse blood pressure check.  Call or go to the emergency room if you get worse, have trouble breathing, have chest pains, or vision changes.   Viral Pharyngitis Viral pharyngitis is a viral infection that produces redness, pain, and swelling (inflammation) of the throat. It can spread from person to person (contagious).  CAUSES Viral pharyngitis is caused by inhaling a large amount of certain germs called viruses. Many different viruses cause viral pharyngitis. SYMPTOMS Symptoms of viral pharyngitis include:  Sore throat.   Tiredness.   Stuffy nose.   Low-grade fever.   Congestion.   Cough.  TREATMENT Treatment includes rest, drinking plenty of fluids, and the use of over-the-counter medication (approved by your caregiver). HOME CARE INSTRUCTIONS    Drink enough fluids to keep your urine clear or pale yellow.   Eat soft, cold foods such as ice cream, frozen ice pops, or gelatin dessert.   Gargle with warm salt water (1 tsp salt per 1 qt of water).   If over age 96, throat lozenges may be used safely.   Only take over-the-counter or prescription medicines for pain, discomfort, or fever as directed by your caregiver. Do not take aspirin.  To help prevent spreading viral pharyngitis to others, avoid:  Mouth-to-mouth contact with others.   Sharing utensils for eating and drinking.   Coughing around others.  SEEK MEDICAL CARE IF:    You are better in a few days, then become worse.   You have a fever or pain not helped by pain medicines.   There are any other changes that concern you.  Document Released: 09/03/2005 Document Revised: 11/13/2011 Document Reviewed: 01/30/2011 [facility] Patient Information 2012 [place], [place].

## 2012-03-03 NOTE — Telephone Encounter (Signed)
Advised mother that since we have available appointment in our office today we cannot give approval to take to [facility]. She cannot get off work early to bring daughter and the earliest she can get here is 4:00 . Advised to be here at 4:00

## 2012-03-03 NOTE — Telephone Encounter (Signed)
Patient is having a headache/sore throat and wants to go to Urgent Care.  Even though Dr. Rivka Safer has an appt for 2:45 today and there are all kinds of opening tomorrow.  She kept saying she doesn't want to go anywhere today and wants to be seen on Friday when we are closed.  She wants approval to go to Urgent Care.

## 2012-03-04 ENCOUNTER — Encounter: Payer: Self-pay | Admitting: Family Medicine

## 2012-03-04 NOTE — Assessment & Plan Note (Signed)
Patient likely has a viral URI with pharyngitis. She has not tried any medications yet for her symptoms. Additionally her rapid strep test is negative. Her vital signs are normal and she does not have any red flag signs or symptoms. Plan for symptom management with Tylenol or ibuprofen and return to clinic if she worsens. Please see patient instructions. Followup as needed

## 2012-03-04 NOTE — Progress Notes (Signed)
Veronica [name] is a 21 y.o. female who presents to [facility] today for Sore throat and headache present for one day.  Headache is anterior. Patient also notes a runny nose. She denies any vision changes weakness numbness or loss of coordination.  She has a sore throat but is able to eat and drink. She is making normal urine. She has not tried any medication for her symptoms yet. She's not sure if she has any sick contacts.   PMH, SH reviewed:Significant for morbid obesity and chronic abdominal pain. ROS as above otherwise neg. No Chest pain, palpitations, SOB, Fever, Chills, Abd pain, N/V/D.  Medications reviewed. Current Outpatient Prescriptions  Medication Sig Dispense Refill  . norgestimate-ethinyl estradiol (SPRINTEC 28) 0.25-35 MG-MCG tablet Take 1 tablet by mouth daily.  1 Package  11  . omeprazole (PRILOSEC) 40 MG capsule Take 1 capsule (40 mg total) by mouth daily.  30 capsule  11  . hyoscyamine (LEVSIN) 0.125 MG tablet Take 1 tablet (0.125 mg total) by mouth every 4 (four) hours as needed for cramping or diarrhea or loose stools.  30 tablet  2    Exam:  BP 150/75  Pulse 91  Temp(Src) 98.4 F (36.9 C) (Oral)  Ht 5\' 4"  (1.626 m)  Wt 268 lb (121.564 kg)  BMI 46.00 kg/m2  LMP 01/28/2012 Gen: Well NAD, Morbid obesity HEENT: EOMI,  MMM Pupils equal round reactive to light. Normal funduscopic exam bilaterally.Posterior pharynx is erythematous without exudate. Lungs: CTABL Nl WOB Heart: RRR no MRG Abd: NABS, NT, ND Exts: Non edematous BL  LE, warm and well perfused.  Neuro: Alert and oriented coordination sensation reflexes are intact. Cranial nerves II through XII are normal. Gait is normal Romberg is negative.  Results for orders placed in visit on 03/03/12 (from the past 72 hour(s))  POCT RAPID STREP A (OFFICE)     Status: Normal   Collection Time   03/03/12  4:45 PM      Component Value Range Comment   Rapid Strep A Screen Negative  Negative

## 2012-06-04 ENCOUNTER — Ambulatory Visit (INDEPENDENT_AMBULATORY_CARE_PROVIDER_SITE_OTHER): Payer: Medicaid Other | Admitting: Family Medicine

## 2012-06-04 ENCOUNTER — Encounter: Payer: Self-pay | Admitting: Family Medicine

## 2012-06-04 VITALS — BP 105/73 | HR 89 | Ht 64.0 in | Wt 265.0 lb

## 2012-06-04 DIAGNOSIS — K219 Gastro-esophageal reflux disease without esophagitis: Secondary | ICD-10-CM

## 2012-06-04 DIAGNOSIS — IMO0001 Reserved for inherently not codable concepts without codable children: Secondary | ICD-10-CM

## 2012-06-04 DIAGNOSIS — H6120 Impacted cerumen, unspecified ear: Secondary | ICD-10-CM

## 2012-06-04 NOTE — Patient Instructions (Signed)
Diet for GERD or PUD Nutrition therapy can help ease the discomfort of gastroesophageal reflux disease (GERD) and peptic ulcer disease (PUD).  HOME CARE INSTRUCTIONS   Eat your meals slowly, in a relaxed setting.   Eat 5 to 6 small meals per day.   If a food causes distress, stop eating it for a period of time.  FOODS TO AVOID  Coffee, regular or decaffeinated.   Cola beverages, regular or low calorie.   Tea, regular or decaffeinated.   Pepper.   Cocoa.   High fat foods, including meats.   Butter, margarine, hydrogenated oil (trans fats).   Peppermint or spearmint (if you have GERD).   Fruits and vegetables if not tolerated.   Alcohol.   Nicotine (smoking or chewing). This is one of the most potent stimulants to acid production in the gastrointestinal tract.   Any food that seems to aggravate your condition.  If you have questions regarding your diet, ask your caregiver or a registered dietitian. TIPS  Lying flat may make symptoms worse. Keep the head of your bed raised 6 to 9 inches (15 to 23 cm) by using a foam wedge or blocks under the legs of the bed.   Do not lay down until 3 hours after eating a meal.   Daily physical activity may help reduce symptoms.  MAKE SURE YOU:   Understand these instructions.   Will watch your condition.   Will get help right away if you are not doing well or get worse.  Document Released: 11/24/2005 Document Revised: 11/13/2011 Document Reviewed: 10/10/2011 [employer] Patient Information 2012 [employer]. 

## 2012-06-07 NOTE — Assessment & Plan Note (Signed)
Flare of this today.  Medication and diet non compliance would seem to be issue.  Discussed compliance with diet and medication, as well as exercise.  Will also refer to GI as pt may benefit from formal eval +/- EGD.  Handout given.  Follow up as needed.

## 2012-06-07 NOTE — Progress Notes (Signed)
  Subjective:    Patient ID: Veronica [name], female    DOB: 08/03/1991, 21 y.o.   MRN: [id number]  HPI Pt with chief complaint of reflux flare.  Baseline hx/o GERD On Prilosec.  Pt states that she has been taking intermittently. Trying to take medication daily.   Still with heartburn sxs.  + chest burning, water brash, recurrent hoarseness.  Pt still eating predominantly fast food diet.  No exercise.     Review of Systems See HPI, otherwise ROS negative     Objective:   Physical Exam Gen: up in chair, NAD, obese  HEENT: NCAT, EOMI, TMs clear bilaterally CV: RRR, no murmurs auscultated PULM: CTAB, no wheezes, rales, rhoncii ABD: S/NT/+ bowel sounds  EXT: 2+ peripheral pulses    Assessment & Plan:

## 2012-07-09 ENCOUNTER — Encounter: Payer: Self-pay | Admitting: Internal Medicine

## 2012-08-06 ENCOUNTER — Encounter: Payer: Self-pay | Admitting: Internal Medicine

## 2012-08-10 ENCOUNTER — Encounter: Payer: Self-pay | Admitting: Internal Medicine

## 2012-08-10 ENCOUNTER — Ambulatory Visit (INDEPENDENT_AMBULATORY_CARE_PROVIDER_SITE_OTHER): Payer: Medicaid Other | Admitting: Internal Medicine

## 2012-08-10 ENCOUNTER — Other Ambulatory Visit (INDEPENDENT_AMBULATORY_CARE_PROVIDER_SITE_OTHER): Payer: Medicaid Other

## 2012-08-10 VITALS — BP 102/62 | HR 88 | Ht 63.0 in | Wt 264.2 lb

## 2012-08-10 DIAGNOSIS — R1030 Lower abdominal pain, unspecified: Secondary | ICD-10-CM

## 2012-08-10 DIAGNOSIS — R109 Unspecified abdominal pain: Secondary | ICD-10-CM

## 2012-08-10 DIAGNOSIS — K625 Hemorrhage of anus and rectum: Secondary | ICD-10-CM

## 2012-08-10 DIAGNOSIS — R1013 Epigastric pain: Secondary | ICD-10-CM

## 2012-08-10 DIAGNOSIS — D509 Iron deficiency anemia, unspecified: Secondary | ICD-10-CM

## 2012-08-10 DIAGNOSIS — R12 Heartburn: Secondary | ICD-10-CM

## 2012-08-10 DIAGNOSIS — E611 Iron deficiency: Secondary | ICD-10-CM

## 2012-08-10 DIAGNOSIS — D649 Anemia, unspecified: Secondary | ICD-10-CM

## 2012-08-10 LAB — COMPREHENSIVE METABOLIC PANEL
Albumin: 3.8 g/dL (ref 3.5–5.2)
Alkaline Phosphatase: 61 U/L (ref 39–117)
BUN: 8 mg/dL (ref 6–23)
Creatinine, Ser: 1 mg/dL (ref 0.4–1.2)
Glucose, Bld: 94 mg/dL (ref 70–99)
Potassium: 3.4 mEq/L — ABNORMAL LOW (ref 3.5–5.1)

## 2012-08-10 LAB — CBC WITH DIFFERENTIAL/PLATELET
Basophils Relative: 0.5 % (ref 0.0–3.0)
Eosinophils Relative: 1 % (ref 0.0–5.0)
HCT: 32.7 % — ABNORMAL LOW (ref 36.0–46.0)
Hemoglobin: 10.4 g/dL — ABNORMAL LOW (ref 12.0–15.0)
Lymphs Abs: 3.3 10*3/uL (ref 0.7–4.0)
MCV: 75.4 fl — ABNORMAL LOW (ref 78.0–100.0)
Monocytes Absolute: 0.5 10*3/uL (ref 0.1–1.0)
Neutro Abs: 6.9 10*3/uL (ref 1.4–7.7)
RBC: 4.34 Mil/uL (ref 3.87–5.11)
WBC: 10.8 10*3/uL — ABNORMAL HIGH (ref 4.5–10.5)

## 2012-08-10 LAB — IBC PANEL: Transferrin: 217.8 mg/dL (ref 212.0–360.0)

## 2012-08-10 LAB — IGA: IgA: 218 mg/dL (ref 68–378)

## 2012-08-10 MED ORDER — ESOMEPRAZOLE MAGNESIUM 40 MG PO CPDR: 40.0000 mg | DELAYED_RELEASE_CAPSULE | Freq: Every day | ORAL | Status: DC

## 2012-08-10 NOTE — Patient Instructions (Addendum)
You have been scheduled for an endoscopy with propofol. Please follow written instructions given to you at your visit today. If you use inhalers (even only as needed), please bring them with you on the day of your procedure.  Your physician has requested that you go to the basement for lab work before leaving today.  We have sent the following medications to your pharmacy for you to pick up at your convenience: Nexium, you have also been given samples

## 2012-08-10 NOTE — Progress Notes (Signed)
Patient ID: Remonia Otte Avena, female   DOB: 11/19/1991, 21 y.o.   MRN: [id number]  SUBJECTIVE: HPI Ms. Lozoya is a 21 yo female with PMH of GERD and obesity who is seen in consultation at the request of Dr. Elwyn Reach for evaluation of GERD and abdominal pain.  The patient reports a long-standing history of epigastric abdominal pain and GERD symptoms. She has been treated with omeprazole 40 mg daily for some time. Initially there was some issues with compliance with this medication, but now she admits to taking it every day. She reports she very rarely forgets to take the medication, but she tries to take it when she remembers. Her heartburn symptoms are deathly better on medication, but not completely relieved. Her heartburn symptoms are epigastric pain and radiation of this pain substernally. She denies issues with dysphagia. No nausea or vomiting. Appetite has been good. Weight has been up and down but mostly stable. Separate from her epigastric abdominal pain, she reports lower abdominal pain which is mostly cramping along with bloating. She's also had erratic bowel patterns, with diarrhea and constipation. Of note she reports bright red blood mixed with her stools over the last 3-4 weeks. This is a new symptom for her but associated with her lower abdominal pain. She denies melena. She denies hemorrhoids or pain with passing the stool. Normal for her, is a bowel movement every 2-3 days, but again there is some alternating loose and hard stool. She denies fevers. She also reports her last menstrual period was a 23, her periods last 5 days and can be heavy. She feels that her abdominal bloating and aching abdominal pain, which is been worse recently, all started after her cesarean section in 2011. She doesn't necessarily relate her lower abdominal pain to eating or to bowel movement. Occasionally she does awake from sleep with issues with her abd pain.  Review of Systems  As per history of present illness,  otherwise negative   Past Medical History  Diagnosis Date  . GERD (gastroesophageal reflux disease)   . Obesity (BMI 30-39.9)   . Hiatal hernia     Current Outpatient Prescriptions  Medication Sig Dispense Refill  . norgestimate-ethinyl estradiol (SPRINTEC 28) 0.25-35 MG-MCG tablet Take 1 tablet by mouth daily.  1 Package  11  . esomeprazole (NEXIUM) 40 MG capsule Take 1 capsule (40 mg total) by mouth daily.  30 capsule  1  . hyoscyamine (LEVSIN) 0.125 MG tablet Take 1 tablet (0.125 mg total) by mouth every 4 (four) hours as needed for cramping or diarrhea or loose stools.  30 tablet  2    No Known Allergies  Family History  Problem Relation Age of Onset  . Diabetes Mother   . Hypertension Mother   . Obesity Mother   . Obesity Sister   . Osteochondroma Sister   -negative for IBD or CRC to her knowledge  History  Substance Use Topics  . Smoking status: Never Smoker   . Smokeless tobacco: Never Used  . Alcohol Use: No    OBJECTIVE: BP 102/62  Pulse 88  Ht 5\' 3"  (1.6 m)  Wt 264 lb 3.2 oz (119.84 kg)  BMI 46.80 kg/m2  LMP 07/30/2012 Constitutional: Well-developed and well-nourished. No distress. HEENT: Normocephalic and atraumatic. Oropharynx is clear and moist. No oropharyngeal exudate. Conjunctivae are normal. Pupils are equal round and reactive to light. No scleral icterus. Neck: Neck supple. Trachea midline. Cardiovascular: Normal rate, regular rhythm and intact distal pulses. No M/R/G Pulmonary/chest: Effort normal  and breath sounds normal. No wheezing, rales or rhonchi. Abdominal: Soft, mild epigastric abdominal tenderness, mild lower abdominal tenderness without rebound or guarding, obese, nondistended. Bowel sounds active throughout. There are no masses palpable.  Extremities: no clubbing, cyanosis, or edema Lymphadenopathy: No cervical adenopathy noted. Neurological: Alert and oriented to person place and time. Skin: Skin is warm and dry. No rashes  noted. Psychiatric: Normal mood and affect. Behavior is normal.  Labs and Imaging -- CBC    Component Value Date/Time   WBC 9.5 02/06/2012 1649   RBC 4.30 02/06/2012 1649   HGB 10.0* 02/06/2012 1649   HCT 31.4* 02/06/2012 1649   PLT 396 02/06/2012 1649   MCV 73.0* 02/06/2012 1649   MCH 23.3* 02/06/2012 1649   MCHC 31.8 02/06/2012 1649   RDW 17.1* 02/06/2012 1649   LYMPHSABS 2.7 05/09/2009 0958   MONOABS 0.4 05/09/2009 0958   EOSABS 0.2 05/09/2009 0958   BASOSABS 0.0 05/09/2009 0958   CMP     Component Value Date/Time   NA 141 02/06/2012 1649   K 3.9 02/06/2012 1649   CL 105 02/06/2012 1649   CO2 26 02/06/2012 1649   GLUCOSE 82 02/06/2012 1649   BUN 13 02/06/2012 1649   CREATININE 0.88 02/06/2012 1649   CREATININE 0.88 05/09/2009 0958   CALCIUM 9.0 02/06/2012 1649   PROT 7.0 02/06/2012 1649   ALBUMIN 3.9 02/06/2012 1649   AST 8 02/06/2012 1649   ALT 9 02/06/2012 1649   ALKPHOS 66 02/06/2012 1649   BILITOT 0.4 02/06/2012 1649   GFRNONAA NOT CALCULATED 05/09/2009 0958   GFRAA  Value: NOT CALCULATED        The eGFR has been calculated using the MDRD equation. This calculation has not been validated in all clinical situations. eGFR's persistently <60 mL/min signify possible Chronic Kidney Disease. 05/09/2009 0958    Lipase     Component Value Date/Time   LIPASE 16 05/09/2009 0958   Imaging studies from June and July 2010 COMPLETE ABDOMINAL ULTRASOUND    Comparison:  None    Findings:    Gallbladder:  The gallbladder is well seen and no gallstones are noted.    Common bile duct:  The common bile duct is normal in caliber measuring 4.2 mm.    Liver:  The liver is echogenic consistent with fatty infiltration. No focal abnormality is seen.    IVC:  Visualized.    Pancreas:  The pancreas appears normal with no ductal dilatation noted.    Spleen:  The spleen is normal in size.    Right Kidney:  No hydronephrosis.  The right kidney measures 12.0 cm sagittally.    Left Kidney:  No hydronephrosis.  The left kidney  measures 12 cm.    Abdominal aorta:  The abdominal aorta is normal in caliber.    IMPRESSION:    1.  No gallstones.  No ductal dilatation. 2.  Fatty infiltration of the liver. Clinical Data: 21 year old with GE reflux disease and epigastric abdominal pain.    HIGH DENSITY UPPER GI SERIES WITHOUT KUB 06/25/2009:    Technique:  Routine upper GI series was performed with effervescent crystals, high-density barium, and thin barium.    Comparison: None.    Findings: Patient swallowed the thick and thin barium liquid without difficulty.  Esophageal peristalsis is normal.  No fixed esophageal strictures.  Moderate sized sliding hiatal hernia with free gastroesophageal reflux during the examination.  No radiographic evidence of esophagitis.    Stomach normal in appearance and empties  normally.  Duodenal bulb and duodenal sweep unremarkable.  No evidence of active peptic ulcer disease.  Visualized proximal jejunum unremarkable.    Fluoroscopy time: 2.5 minutes, pulsed fluoroscopy    IMPRESSION:    1.  Moderate sized sliding hiatal hernia with free gastroesophageal reflux.  No radiographic evidence of esophagitis.  No fixed esophageal strictures. 2.  Otherwise normal upper GI series.   ASSESSMENT AND PLAN: 21 yo female with PMH of GERD and obesity who is seen in consultation at the request of Dr. Elwyn Reach for evaluation of GERD and abdominal pain.  1. GERD/epigastric pain -- the patient's symptoms have not completely responded to omeprazole 40 mg daily. Given her lack of complete response to PPI, her long-standing symptoms, I recommended direct visualization with upper endoscopy. We discussed this test and she is agreeable to proceed. I will discontinue her omeprazole, and give her a trial of Nexium 40 mg daily. She is reminded to take this 30 minutes to one hour before her first melena the day, and I have recommended she take it daily without skipping doses whenever possible.  2.  Rectal bleeding/lower abdominal pain/iron def anemia -- given the patient's lower abdominal pain, which seems separate from her epigastric pain along with her rectal bleeding I have recommended direct visualization with flexible sigmoidoscopy. This will be performed on the same day as her EGD. We discussed this test and she is agreeable to proceed. I would like to recheck her iron studies along with CBC, as there has been some evidence of iron deficiency in the past. This may be secondary to menstrual bleeding, but I would like to rule out inflammatory bowel disease as a possible cause. If her iron deficiency remains, I will recommend iron supplementation. I will also check a celiac panel, though this is less likely given her race.  Further recommendations after the above studies have been complete

## 2012-08-11 ENCOUNTER — Encounter: Payer: Self-pay | Admitting: Internal Medicine

## 2012-08-11 LAB — TISSUE TRANSGLUTAMINASE, IGA: Tissue Transglutaminase Ab, IgA: 5.8 U/mL (ref <20)

## 2012-08-12 ENCOUNTER — Telehealth: Payer: Self-pay | Admitting: Internal Medicine

## 2012-08-12 NOTE — Telephone Encounter (Signed)
/  Mom, Corrie Dandy calling with questions about prep prior to the pts flex sig/ egd scheduled for tomorrow. Mom instructed clear liquid dinner tonight, start mag citrite over ice at 7pm followed by 3- 8oz of clear fluids and then tomorrow clear liquids only until 12 noon then npo after 12 noon. Pt is to do enema 1 hour prior to arrival of procedure so around 1 because pt to arrive here at 2pm. Instructed mom several times pt is to not have any more solid foods from this point just clear liquids. Mom instructed on clear liquid list and mom Corrie Dandy verbalized understanding of all instructions given.  ewm

## 2012-08-13 ENCOUNTER — Ambulatory Visit (AMBULATORY_SURGERY_CENTER): Payer: Medicaid Other | Admitting: Internal Medicine

## 2012-08-13 ENCOUNTER — Other Ambulatory Visit: Payer: Self-pay | Admitting: *Deleted

## 2012-08-13 ENCOUNTER — Encounter: Payer: Self-pay | Admitting: Internal Medicine

## 2012-08-13 VITALS — BP 167/82 | HR 95 | Temp 98.9°F | Resp 16 | Ht 63.0 in | Wt 264.0 lb

## 2012-08-13 DIAGNOSIS — R12 Heartburn: Secondary | ICD-10-CM

## 2012-08-13 DIAGNOSIS — K625 Hemorrhage of anus and rectum: Secondary | ICD-10-CM

## 2012-08-13 DIAGNOSIS — R109 Unspecified abdominal pain: Secondary | ICD-10-CM

## 2012-08-13 DIAGNOSIS — R1013 Epigastric pain: Secondary | ICD-10-CM

## 2012-08-13 DIAGNOSIS — D649 Anemia, unspecified: Secondary | ICD-10-CM

## 2012-08-13 DIAGNOSIS — K219 Gastro-esophageal reflux disease without esophagitis: Secondary | ICD-10-CM

## 2012-08-13 DIAGNOSIS — R1030 Lower abdominal pain, unspecified: Secondary | ICD-10-CM

## 2012-08-13 MED ORDER — ESOMEPRAZOLE MAGNESIUM 40 MG PO CPDR: 40.0000 mg | DELAYED_RELEASE_CAPSULE | Freq: Every day | ORAL | Status: DC

## 2012-08-13 MED ORDER — INTEGRA F 125-1 MG PO CAPS: 1.0000 | ORAL_CAPSULE | Freq: Every day | ORAL | Status: DC

## 2012-08-13 MED ORDER — SODIUM CHLORIDE 0.9 % IV SOLN: 500.0000 mL | INTRAVENOUS | Status: DC

## 2012-08-13 NOTE — Op Note (Signed)
[facility] [address] [place], [address]   FLEXIBLE SIGMOIDOSCOPY PROCEDURE REPORT  PATIENT: [name], [name]  MR#: [id number] BIRTHDATE: May 28, 1991 , 21  yrs. old GENDER: Female ENDOSCOPIST: Beverley Fiedler, MD REFERRED BY: Despina Hick, MD PROCEDURE DATE:  08/13/2012 PROCEDURE:   Sigmoidoscopy, diagnostic ASA CLASS:   Class II INDICATIONS:iron deficiency anemia.   abdominal pain, generalized . rectal bleeding. MEDICATIONS: MAC sedation, administered by CRNA and Propofol (Diprivan) 530 mg IV  DESCRIPTION OF PROCEDURE:   After the risks benefits and alternatives of the procedure were thoroughly explained, informed consent was obtained.  revealed no abnormalities of the rectum. The LB-GIF-H180 [id number]  endoscope was introduced through the anus  and advanced to the descending colon , limited by No adverse events experienced.   The quality of the prep was excellent .  The instrument was then slowly withdrawn as the mucosa was fully examined.      COLON FINDINGS: The colonic mucosa appeared normal in the descending colon, sigmoid colon, and rectum. Retroflexed views revealed no abnormalities.    The scope was then withdrawn from the patient and the procedure terminated.  COMPLICATIONS: There were no complications.  ENDOSCOPIC IMPRESSION: The colonic mucosa appeared normal in the descending colon, sigmoid colon, and rectum.  No source found to explain rectal bleeding or iron deficiency anemia.  RECOMMENDATIONS: 1.  Continue current meds 2.  Office follow-up in 6- 8 weeks   eSigned:  Beverley Fiedler, MD 08/13/2012 4:05 PM   XB:JYNWG, Denny Peon MD The Patient

## 2012-08-13 NOTE — Op Note (Signed)
[facility] [address] [place], [address]   ENDOSCOPY PROCEDURE REPORT  PATIENT: Veronica [name], Veronica [name]  MR#: [id number] BIRTHDATE: 1991/07/19 , 21  yrs. old GENDER: Female ENDOSCOPIST: Beverley Fiedler, MD REFERRED BY: PROCEDURE DATE:  08/13/2012 PROCEDURE:  EGD, diagnostic ASA CLASS:     Class II INDICATIONS:  history of GERD.   epigastric pain. MEDICATIONS: MAC sedation, administered by CRNA and Propofol (Diprivan) 530 mg IV TOPICAL ANESTHETIC: none  DESCRIPTION OF PROCEDURE: After the risks benefits and alternatives of the procedure were thoroughly explained, informed consent was obtained.  The LB GIF-H180 [id number] endoscope was introduced through the mouth and advanced to the second portion of the duodenum. Without limitations.  The instrument was slowly withdrawn as the mucosa was fully examined.   ESOPHAGUS: There was LA Class A esophagitis noted at GE junction. The esophagus was otherwise normal.   A 6 cm hiatal hernia was noted.  STOMACH: The mucosa of the stomach appeared normal.  DUODENUM: The duodenal mucosa showed no abnormalities in the bulb and second portion of the duodenum.  Retroflexed views revealed a hiatal hernia.     The scope was then withdrawn from the patient and the procedure completed.  COMPLICATIONS: There were no complications. ENDOSCOPIC IMPRESSION: 1.   There was LA Class A esophagitis noted 2.   The esophagus was otherwise normal. 3.   6 cm hiatal hernia 4.   The mucosa of the stomach appeared normal 5.   The duodenal mucosa showed no abnormalities in the bulb and second portion of the duodenum  RECOMMENDATIONS: 1.   Continue Nexium 40 mg daily. 2.   Office follow-up in 6-8 weeks.  eSigned:  Beverley Fiedler, MD 08/13/2012 4:00 PM   CC:The Patient and Despina Hick MD

## 2012-08-13 NOTE — Patient Instructions (Addendum)
CONTINUE YOUR NEXIUM.  INTEGRA PRESCRIPTION SENT TO YOUR [facility].   OFFICE APPOINTMENT WITH DR. PYRTLE, [phone], September 21, 2012 AT 2:00 PM.    YOU HAD AN ENDOSCOPIC PROCEDURE TODAY AT THE [facility]: Refer to the procedure report that was given to you for any specific questions about what was found during the examination.  If the procedure report does not answer your questions, please call your gastroenterologist to clarify.  If you requested that your care partner not be given the details of your procedure findings, then the procedure report has been included in a sealed envelope for you to review at your convenience later.  YOU SHOULD EXPECT: Some feelings of bloating in the abdomen. Passage of more gas than usual.  Walking can help get rid of the air that was put into your GI tract during the procedure and reduce the bloating. If you had a lower endoscopy (such as a colonoscopy or flexible sigmoidoscopy) you may notice spotting of blood in your stool or on the toilet paper. If you underwent a bowel prep for your procedure, then you may not have a normal bowel movement for a few days.  DIET: Your first meal following the procedure should be a light meal and then it is ok to progress to your normal diet.  A half-sandwich or bowl of soup is an example of a good first meal.  Heavy or fried foods are harder to digest and may make you feel nauseous or bloated.  Likewise meals heavy in dairy and vegetables can cause extra gas to form and this can also increase the bloating.  Drink plenty of fluids but you should avoid alcoholic beverages for 24 hours.  ACTIVITY: Your care partner should take you home directly after the procedure.  You should plan to take it easy, moving slowly for the rest of the day.  You can resume normal activity the day after the procedure however you should NOT DRIVE or use heavy machinery for 24 hours (because of the sedation medicines used during the test).     SYMPTOMS TO REPORT IMMEDIATELY: A gastroenterologist can be reached at any hour.  During normal business hours, 8:30 AM to 5:00 PM Monday through Friday, call [phone].  After hours and on weekends, please call the GI answering service at [phone] who will take a message and have the physician on call contact you.   Following lower endoscopy (colonoscopy or flexible sigmoidoscopy):  Excessive amounts of blood in the stool  Significant tenderness or worsening of abdominal pains  Swelling of the abdomen that is new, acute  Fever of 100F or higher  Following upper endoscopy (EGD)  Vomiting of blood or coffee ground material  New chest pain or pain under the shoulder blades  Painful or persistently difficult swallowing  New shortness of breath  Fever of 100F or higher  Black, tarry-looking stools  FOLLOW UP: If any biopsies were taken you will be contacted by phone or by letter within the next 1-3 weeks.  Call your gastroenterologist if you have not heard about the biopsies in 3 weeks.  Our staff will call the home number listed on your records the next business day following your procedure to check on you and address any questions or concerns that you may have at that time regarding the information given to you following your procedure. This is a courtesy call and so if there is no answer at the home number and we have not heard from  you through the emergency physician on call, we will assume that you have returned to your regular daily activities without incident.  SIGNATURES/CONFIDENTIALITY: You and/or your care partner have signed paperwork which will be entered into your electronic medical record.  These signatures attest to the fact that that the information above on your After Visit Summary has been reviewed and is understood.  Full responsibility of the confidentiality of this discharge information lies with you and/or your care-partner.

## 2012-08-13 NOTE — Progress Notes (Signed)
Patient did not experience any of the following events: a burn prior to discharge; a fall within the facility; wrong site/side/patient/procedure/implant event; or a hospital transfer or hospital admission upon discharge from the facility. (G8907) Patient did not have preoperative order for IV antibiotic SSI prophylaxis. (G8918)  

## 2012-08-16 ENCOUNTER — Telehealth: Payer: Self-pay | Admitting: *Deleted

## 2012-08-16 NOTE — Telephone Encounter (Signed)
  Follow up Call-  Call back number 08/13/2012  Post procedure Call Back phone  # [phone] or [phone]  Permission to leave phone message Yes     Patient questions:  Do you have a fever, pain , or abdominal swelling? no Pain Score  0 *  Have you tolerated food without any problems? yes  Have you been able to return to your normal activities? yes  Do you have any questions about your discharge instructions: Diet   no Medications  no Follow up visit  no  Do you have questions or concerns about your Care? no  Actions: * If pain score is 4 or above: No action needed, pain <4.

## 2012-09-09 ENCOUNTER — Telehealth: Payer: Self-pay | Admitting: Gastroenterology

## 2012-09-09 NOTE — Telephone Encounter (Signed)
lvm for pt to call me back regarding her Rx for nexium. Need to start prior auth

## 2012-09-20 ENCOUNTER — Encounter: Payer: Self-pay | Admitting: Internal Medicine

## 2012-09-21 ENCOUNTER — Ambulatory Visit (INDEPENDENT_AMBULATORY_CARE_PROVIDER_SITE_OTHER): Payer: Medicaid Other | Admitting: Internal Medicine

## 2012-09-21 ENCOUNTER — Other Ambulatory Visit (INDEPENDENT_AMBULATORY_CARE_PROVIDER_SITE_OTHER): Payer: Medicaid Other

## 2012-09-21 ENCOUNTER — Encounter: Payer: Self-pay | Admitting: Internal Medicine

## 2012-09-21 VITALS — BP 126/72 | HR 79 | Ht 63.0 in | Wt 260.0 lb

## 2012-09-21 DIAGNOSIS — R12 Heartburn: Secondary | ICD-10-CM

## 2012-09-21 DIAGNOSIS — R1030 Lower abdominal pain, unspecified: Secondary | ICD-10-CM

## 2012-09-21 DIAGNOSIS — D509 Iron deficiency anemia, unspecified: Secondary | ICD-10-CM

## 2012-09-21 DIAGNOSIS — R109 Unspecified abdominal pain: Secondary | ICD-10-CM

## 2012-09-21 DIAGNOSIS — E611 Iron deficiency: Secondary | ICD-10-CM

## 2012-09-21 DIAGNOSIS — R1013 Epigastric pain: Secondary | ICD-10-CM

## 2012-09-21 LAB — CBC WITH DIFFERENTIAL/PLATELET
Basophils Absolute: 0 10*3/uL (ref 0.0–0.1)
HCT: 31.3 % — ABNORMAL LOW (ref 36.0–46.0)
Lymphs Abs: 3 10*3/uL (ref 0.7–4.0)
MCHC: 32 g/dL (ref 30.0–36.0)
MCV: 74.6 fl — ABNORMAL LOW (ref 78.0–100.0)
Monocytes Absolute: 0.4 10*3/uL (ref 0.1–1.0)
Neutro Abs: 4.6 10*3/uL (ref 1.4–7.7)
Platelets: 368 10*3/uL (ref 150.0–400.0)
RDW: 17.2 % — ABNORMAL HIGH (ref 11.5–14.6)

## 2012-09-21 LAB — IBC PANEL: Iron: 26 ug/dL — ABNORMAL LOW (ref 42–145)

## 2012-09-21 MED ORDER — ESOMEPRAZOLE MAGNESIUM 40 MG PO CPDR: 40.0000 mg | DELAYED_RELEASE_CAPSULE | Freq: Every day | ORAL | Status: DC

## 2012-09-21 MED ORDER — INTEGRA F 125-1 MG PO CAPS: 1.0000 | ORAL_CAPSULE | Freq: Every day | ORAL | Status: DC

## 2012-09-21 NOTE — Progress Notes (Signed)
Subjective:    Patient ID: Veronica [name], female    DOB: Sep 08, 1991, 21 y.o.   MRN: [id number]  HPI Veronica [name] is a 21 yo female with PMH of GERD and obesity who is seen in followup. She was initially seen for GERD abdominal pain in September and underwent endoscopy and flexible sigmoidoscopy on 08/13/2012.  EGD revealed LA grade a esophagitis, 6 cm hiatal hernia and was otherwise unremarkable. Flex will sigmoidoscopy was normal to the descending colon. Her PPI was switched from omeprazole to Nexium 40 mg daily and with this she's had much improvement in her heartburn and upper epigastric abdominal pain. She's eating well. No dysphagia or odynophagia. No nausea or vomiting. She also started Integra for her anemia with low iron and is felt a boost in energy and stamina. She feels this is related to the iron and wants to keep taking it. No further rectal bleeding and no melena.  She does report numbness and tingling in the bottom of feet and toes, has also noticed polyuria and nocturia without dysuria hesitancy or hematuria. She does continue to have burning along cesarean scar which is chronic and unchanged.  Review of Systems As per history of present illness, otherwise negative  Current Medications, Allergies, Past Medical History, Past Surgical History, Family History and Social History were reviewed in [employer] record.     Objective:   Physical Exam BP 126/72  Pulse 79  Ht 5\' 3"  (1.6 m)  Wt 260 lb (117.935 kg)  BMI 46.06 kg/m2  LMP 09/17/2012 Constitutional: Well-developed and well-nourished. No distress. HEENT: Normocephalic and atraumatic. Oropharynx is clear and moist. No oropharyngeal exudate. Conjunctivae are normal.  No scleral icterus. Cardiovascular: Normal rate, regular rhythm and intact distal pulses. No M/R/G Pulmonary/chest: Effort normal and breath sounds normal. No wheezing, rales or rhonchi. Abdominal: Soft, nontender, nondistended. Bowel  sounds active throughout. There are no masses palpable. No hepatosplenomegaly. Extremities: no clubbing, cyanosis, or edema Neurological: Alert and oriented to person place and time. Psychiatric: Normal mood and affect. Behavior is normal.  CBC    Component Value Date/Time   WBC 10.8* 08/10/2012 1531   RBC 4.34 08/10/2012 1531   HGB 10.4* 08/10/2012 1531   HCT 32.7* 08/10/2012 1531   PLT 396.0 08/10/2012 1531   MCV 75.4* 08/10/2012 1531   MCH 23.3* 02/06/2012 1649   MCHC 31.7 08/10/2012 1531   RDW 17.0* 08/10/2012 1531   LYMPHSABS 3.3 08/10/2012 1531   MONOABS 0.5 08/10/2012 1531   EOSABS 0.1 08/10/2012 1531   BASOSABS 0.1 08/10/2012 1531    Iron/TIBC/Ferritin    Component Value Date/Time   IRON 31* 08/10/2012 1531   FERRITIN 13.0 08/10/2012 1531       Assessment & Plan:  21 yo female with PMH of GERD and obesity who is seen in followup. She was initially seen for GERD abdominal pain in September and underwent endoscopy and flexible sigmoidoscopy on 08/13/2012.  1.  GERD/mild esophagitis -- her upper GI symptoms have responded nicely to Nexium. She is given additional samples today as well as a prescription for Nexium 40 mg once daily. She's reminded to take this 30 minutes to one hour before breakfast. She will be provided refills today and is asked to call us if her symptoms worsen on therapy.  2.  Iron def -- unclear etiology of her iron deficiency, celiac panel negative and endoscopy unrevealing. This may be secondary to menstrual bleeding, and I've recommended she continue Integra daily for iron  supplementation. We will recheck her iron stores and CBC in December to ensure improvement.  3.  BRBPR -- resolved with reassuring flexible sigmoidoscopy. This may have been secondary to resolved hemorrhoidal inflammation.  Return in 1 yr

## 2012-09-21 NOTE — Patient Instructions (Addendum)
Your physician has requested that you go to the basement for the following lab work before leaving today: IBC, CBC-Diff, Ferratin  continue taking Nexium and Integra daily  Follow up with Dr. Elwyn Reach for the numbness and tingling in your legs and feet.

## 2012-09-22 ENCOUNTER — Other Ambulatory Visit: Payer: Self-pay | Admitting: *Deleted

## 2012-09-22 DIAGNOSIS — D649 Anemia, unspecified: Secondary | ICD-10-CM

## 2012-09-23 ENCOUNTER — Ambulatory Visit (INDEPENDENT_AMBULATORY_CARE_PROVIDER_SITE_OTHER): Payer: Medicaid Other | Admitting: Family Medicine

## 2012-09-23 ENCOUNTER — Encounter: Payer: Self-pay | Admitting: Family Medicine

## 2012-09-23 VITALS — BP 106/71 | HR 69 | Temp 99.0°F | Ht 63.0 in | Wt 260.0 lb

## 2012-09-23 DIAGNOSIS — R350 Frequency of micturition: Secondary | ICD-10-CM | POA: Insufficient documentation

## 2012-09-23 DIAGNOSIS — H6120 Impacted cerumen, unspecified ear: Secondary | ICD-10-CM

## 2012-09-23 DIAGNOSIS — E669 Obesity, unspecified: Secondary | ICD-10-CM

## 2012-09-23 DIAGNOSIS — R200 Anesthesia of skin: Secondary | ICD-10-CM

## 2012-09-23 DIAGNOSIS — R209 Unspecified disturbances of skin sensation: Secondary | ICD-10-CM

## 2012-09-23 LAB — POCT URINALYSIS DIPSTICK
Glucose, UA: NEGATIVE
Ketones, UA: NEGATIVE
Leukocytes, UA: NEGATIVE
Spec Grav, UA: 1.02
Urobilinogen, UA: 1

## 2012-09-23 NOTE — Patient Instructions (Signed)
Thank you for coming in today, it was good to see you I am not sure what may be causing the numbness in your feet Your thyroid looked ok when Dr. Rhea Belton checked last month

## 2012-09-24 LAB — COMPREHENSIVE METABOLIC PANEL
Albumin: 4.1 g/dL (ref 3.5–5.2)
Alkaline Phosphatase: 62 U/L (ref 39–117)
BUN: 10 mg/dL (ref 6–23)
CO2: 25 mEq/L (ref 19–32)
Glucose, Bld: 86 mg/dL (ref 70–99)
Total Bilirubin: 0.3 mg/dL (ref 0.3–1.2)

## 2012-09-27 ENCOUNTER — Telehealth: Payer: Self-pay | Admitting: Gastroenterology

## 2012-09-27 NOTE — Telephone Encounter (Signed)
Prior Authorization approved for Nexium has been approved. Pharmacy to notify pt when Rx is ready

## 2012-09-28 NOTE — Progress Notes (Signed)
  Subjective:    Patient ID: Veronica [name], female    DOB: 1991-03-07, 21 y.o.   MRN: [id number]  HPI   1.  Foot numbness:  Foot numbness x3 weeks.  Bilatarel involvement.  Limited to bottom of feet and toes. Denies trauma.  No factors that make better or worse.  Does endorse some increased urination and sister and mom have history of diabetes.  No difficulty walking.  No burning pain, low back pain or swelling noted.   2. Ear pain:  C/o bilateral ear pain when pulling or scratching at ears.  Has been going on for a couple of weeks.  Has not noticed any difficulty with hearing.  Denies drainage form ears, fever, chills, congestion.  Review of Systems Per HPI    Objective:   Physical Exam  Constitutional:       Obese female, nad   HENT:  Head: Normocephalic and atraumatic.       Cerumen impaction bilaterally.  Portion of canal that can be visualized appears normal.  Neck: Neck supple.  Musculoskeletal:       SLR negative. FROM with spine movement.   Sensation normal in legs and top of foot States sensation is different along soles of feet. Monofilament testing shows some decreased sensation along the toes and forefoot.   Pulses 2+ bilaterally            Assessment & Plan:

## 2012-10-06 DIAGNOSIS — H6120 Impacted cerumen, unspecified ear: Secondary | ICD-10-CM | POA: Insufficient documentation

## 2012-10-06 NOTE — Assessment & Plan Note (Signed)
Bilateral foot numbness.  Unsure of etiology.  Has had some urinary frequency and thirst.  Will check chemistry to evaluate glucose to be sure diabetes not contributing. No signs of radicular pain.

## 2012-10-06 NOTE — Assessment & Plan Note (Signed)
Bilateral cerumen impaction.  Attempted flushing x2 but was unsuccessful, will refer to ENT to thorough cleaning.  Advised to avoid q-tips

## 2012-10-28 ENCOUNTER — Other Ambulatory Visit: Payer: Self-pay | Admitting: Internal Medicine

## 2012-10-28 NOTE — Telephone Encounter (Signed)
Spoke to pt's mother, she said she does not want an Rx she just wants samples, and she would like a year supply because that's what Dr. Rhea Belton told them they would get at Caidynce's last visit. I did not see that in Dr. Lauro Franklin chart. I gave pt 1 month's worth of samples and told her I would talk to Dr. Rhea Belton about a "year supply"

## 2012-11-29 ENCOUNTER — Telehealth: Payer: Self-pay | Admitting: Internal Medicine

## 2012-11-29 DIAGNOSIS — R1013 Epigastric pain: Secondary | ICD-10-CM

## 2012-11-29 DIAGNOSIS — R12 Heartburn: Secondary | ICD-10-CM

## 2012-11-29 DIAGNOSIS — R1030 Lower abdominal pain, unspecified: Secondary | ICD-10-CM

## 2012-11-29 MED ORDER — INTEGRA F 125-1 MG PO CAPS: 1.0000 | ORAL_CAPSULE | Freq: Every day | ORAL | Status: DC

## 2012-11-29 NOTE — Telephone Encounter (Signed)
Pt aware med sent to the pharmacy

## 2012-12-03 ENCOUNTER — Telehealth: Payer: Self-pay

## 2012-12-03 NOTE — Telephone Encounter (Signed)
Spoke to pt's mother, told her samples will be at our front desk waiting for her.

## 2012-12-21 ENCOUNTER — Telehealth: Payer: Self-pay | Admitting: Internal Medicine

## 2012-12-21 MED ORDER — PANTOPRAZOLE SODIUM 40 MG PO TBEC: 40.0000 mg | DELAYED_RELEASE_TABLET | Freq: Every day | ORAL | Status: DC

## 2012-12-21 NOTE — Telephone Encounter (Signed)
Sent in pantoprazole pharmacy to call pt when ready

## 2012-12-23 ENCOUNTER — Telehealth: Payer: Self-pay | Admitting: *Deleted

## 2012-12-23 NOTE — Telephone Encounter (Signed)
lmom for pt to call back

## 2012-12-23 NOTE — Telephone Encounter (Signed)
Message copied by Florene Glen on Thu Dec 23, 2012  8:33 AM ------      Message from: Florene Glen      Created: Wed Sep 22, 2012 11:34 AM       Needs repeat cbc in 3 months- Jan for anemia

## 2012-12-24 ENCOUNTER — Other Ambulatory Visit: Payer: Self-pay | Admitting: Gastroenterology

## 2012-12-24 ENCOUNTER — Telehealth: Payer: Self-pay | Admitting: Internal Medicine

## 2012-12-24 MED ORDER — RABEPRAZOLE SODIUM 20 MG PO TBEC: 20.0000 mg | DELAYED_RELEASE_TABLET | Freq: Every day | ORAL | Status: DC

## 2012-12-24 NOTE — Telephone Encounter (Signed)
Informed pt she needs to come in for repeat labs; pt stated understanding. 

## 2012-12-24 NOTE — Telephone Encounter (Signed)
Spoke to pt. Wants another PPI because protonix is too expensive. I told her I will talk to Dr. Rhea Belton and see what he recommends and send it in. Pt stated understanding

## 2013-01-11 ENCOUNTER — Ambulatory Visit: Payer: Medicaid Other | Admitting: Family Medicine

## 2013-01-13 ENCOUNTER — Other Ambulatory Visit (HOSPITAL_COMMUNITY)
Admission: RE | Admit: 2013-01-13 | Discharge: 2013-01-13 | Disposition: A | Discharge status: Home / Self Care | Payer: Medicaid Other | Source: Ambulatory Visit | Attending: Family Medicine | Admitting: Family Medicine

## 2013-01-13 ENCOUNTER — Ambulatory Visit (INDEPENDENT_AMBULATORY_CARE_PROVIDER_SITE_OTHER): Payer: Medicaid Other | Admitting: Family Medicine

## 2013-01-13 ENCOUNTER — Encounter: Payer: Self-pay | Admitting: Family Medicine

## 2013-01-13 VITALS — BP 116/77 | HR 77 | Ht 63.0 in | Wt 270.1 lb

## 2013-01-13 DIAGNOSIS — Z113 Encounter for screening for infections with a predominantly sexual mode of transmission: Secondary | ICD-10-CM | POA: Insufficient documentation

## 2013-01-13 DIAGNOSIS — Z202 Contact with and (suspected) exposure to infections with a predominantly sexual mode of transmission: Secondary | ICD-10-CM | POA: Insufficient documentation

## 2013-01-13 DIAGNOSIS — R300 Dysuria: Secondary | ICD-10-CM

## 2013-01-13 DIAGNOSIS — Z9189 Other specified personal risk factors, not elsewhere classified: Secondary | ICD-10-CM

## 2013-01-13 HISTORY — DX: Dysuria: R30.0

## 2013-01-13 LAB — POCT URINALYSIS DIPSTICK
Glucose, UA: NEGATIVE
Ketones, UA: NEGATIVE
Leukocytes, UA: NEGATIVE
Spec Grav, UA: 1.02
Urobilinogen, UA: 1

## 2013-01-13 LAB — POCT WET PREP (WET MOUNT)

## 2013-01-13 LAB — RPR

## 2013-01-13 NOTE — Patient Instructions (Addendum)
It was nice seeing you today.  You do not have a UTI.  I will call you with the results of your cultures.  If positive I will call in medications for you.  Please follow up with your PCP as indicated.

## 2013-01-13 NOTE — Assessment & Plan Note (Signed)
UA done today and was unremarkable (no evidence of UTI).  Dysuria and urinary frequency could be secondary to underlying STD.  Will await GC/Chlamydia results.

## 2013-01-13 NOTE — Assessment & Plan Note (Addendum)
Wet prep, GC/Chylamydia, HIV and RPR done today.   Wet prep revealed 3+ rods (normal flora) and no yeast or clue cells.  Awaiting the results of the above studies. If positive will call patient and send Rx.

## 2013-01-13 NOTE — Progress Notes (Signed)
Subjective:     Patient ID: Veronica [name], female   DOB: 01/11/1991, 22 y.o.   MRN: [id number]  HPI 22 year old female presents to the clinic today with complaints of dysuria and vaginal discharge.  1) Dysuria - Burning and urinary frequency x 3 days.  Denies blood in urine.  2) Vaginal discharge - Described as thick, white, and malodorous. - Patient reports recent unprotected sex approximately 2 weeks ago. - Denies abdominal pain.  Review of Systems - ROS: Denies abdominal pain, fevers, chills, nausea, vomiting.    Objective:   Physical Exam General: well appearing, NAD. Heart: RRR. No murmurs, rubs, or gallops. Lungs: CTAB. No rales, rhonchi, or wheeze. Abdomen: obese, soft, nontender, nondistended.  Pelvic Exam:        External: normal female genitalia without lesions or masses        Vagina: normal without lesions or masses        Cervix: normal without lesions or masses        Adnexa: normal bimanual exam without masses or fullness        Uterus: normal by palpation        Samples for Wet prep, GC/Chlamydia obtained     Assessment:     Plan:

## 2013-01-14 LAB — HIV ANTIBODY (ROUTINE TESTING W REFLEX): HIV: NONREACTIVE

## 2013-01-17 ENCOUNTER — Telehealth: Payer: Self-pay | Admitting: Family Medicine

## 2013-01-17 NOTE — Telephone Encounter (Signed)
Spoke with patient.  Notified patient of negative laboratory studies (GC/Chlamydia, HIV, RPR)

## 2013-01-18 ENCOUNTER — Emergency Department (HOSPITAL_COMMUNITY): Payer: Self-pay

## 2013-01-18 ENCOUNTER — Emergency Department (HOSPITAL_COMMUNITY)
Admission: EM | Admit: 2013-01-18 | Discharge: 2013-01-19 | Disposition: A | Discharge status: Home / Self Care | Payer: Self-pay | Attending: Emergency Medicine | Admitting: Emergency Medicine

## 2013-01-18 ENCOUNTER — Encounter (HOSPITAL_COMMUNITY): Payer: Self-pay | Admitting: Emergency Medicine

## 2013-01-18 DIAGNOSIS — R6883 Chills (without fever): Secondary | ICD-10-CM | POA: Insufficient documentation

## 2013-01-18 DIAGNOSIS — K219 Gastro-esophageal reflux disease without esophagitis: Secondary | ICD-10-CM | POA: Insufficient documentation

## 2013-01-18 DIAGNOSIS — R109 Unspecified abdominal pain: Secondary | ICD-10-CM

## 2013-01-18 DIAGNOSIS — R0602 Shortness of breath: Secondary | ICD-10-CM | POA: Insufficient documentation

## 2013-01-18 DIAGNOSIS — R1013 Epigastric pain: Secondary | ICD-10-CM | POA: Insufficient documentation

## 2013-01-18 DIAGNOSIS — Z79899 Other long term (current) drug therapy: Secondary | ICD-10-CM | POA: Insufficient documentation

## 2013-01-18 DIAGNOSIS — Z3202 Encounter for pregnancy test, result negative: Secondary | ICD-10-CM | POA: Insufficient documentation

## 2013-01-18 DIAGNOSIS — R110 Nausea: Secondary | ICD-10-CM | POA: Insufficient documentation

## 2013-01-18 DIAGNOSIS — E669 Obesity, unspecified: Secondary | ICD-10-CM | POA: Insufficient documentation

## 2013-01-18 DIAGNOSIS — Z8719 Personal history of other diseases of the digestive system: Secondary | ICD-10-CM | POA: Insufficient documentation

## 2013-01-18 LAB — URINALYSIS, ROUTINE W REFLEX MICROSCOPIC
Hgb urine dipstick: NEGATIVE
Leukocytes, UA: NEGATIVE
Protein, ur: NEGATIVE mg/dL
Urobilinogen, UA: 1 mg/dL (ref 0.0–1.0)

## 2013-01-18 LAB — COMPREHENSIVE METABOLIC PANEL
ALT: 8 U/L (ref 0–35)
AST: 7 U/L (ref 0–37)
Albumin: 3.6 g/dL (ref 3.5–5.2)
CO2: 25 mEq/L (ref 19–32)
Calcium: 8.9 mg/dL (ref 8.4–10.5)
Sodium: 136 mEq/L (ref 135–145)
Total Protein: 8.1 g/dL (ref 6.0–8.3)

## 2013-01-18 LAB — CBC WITH DIFFERENTIAL/PLATELET
Basophils Absolute: 0 10*3/uL (ref 0.0–0.1)
Basophils Relative: 0 % (ref 0–1)
Eosinophils Absolute: 0.2 10*3/uL (ref 0.0–0.7)
Eosinophils Relative: 2 % (ref 0–5)
Lymphocytes Relative: 26 % (ref 12–46)
MCHC: 33.2 g/dL (ref 30.0–36.0)
MCV: 74.6 fL — ABNORMAL LOW (ref 78.0–100.0)
Platelets: 372 10*3/uL (ref 150–400)
RDW: 16.8 % — ABNORMAL HIGH (ref 11.5–15.5)
WBC: 10.1 10*3/uL (ref 4.0–10.5)

## 2013-01-18 LAB — LIPASE, BLOOD: Lipase: 17 U/L (ref 11–59)

## 2013-01-18 MED ORDER — MORPHINE SULFATE 4 MG/ML IJ SOLN
4.0000 mg | Freq: Once | INTRAMUSCULAR | Status: AC
  Administered 2013-01-18: 4 mg via INTRAVENOUS
  Filled 2013-01-18: qty 1

## 2013-01-18 MED ORDER — SODIUM CHLORIDE 0.9 % IV BOLUS (SEPSIS)
1000.0000 mL | Freq: Once | INTRAVENOUS | Status: AC
  Administered 2013-01-18: 1000 mL via INTRAVENOUS

## 2013-01-18 MED ORDER — ONDANSETRON HCL 4 MG PO TABS: 4.0000 mg | ORAL_TABLET | Freq: Four times a day (QID) | ORAL | Status: DC

## 2013-01-18 MED ORDER — GI COCKTAIL ~~LOC~~
30.0000 mL | Freq: Once | ORAL | Status: AC
  Administered 2013-01-18: 30 mL via ORAL
  Filled 2013-01-18: qty 30

## 2013-01-18 MED ORDER — ONDANSETRON HCL 4 MG/2ML IJ SOLN
4.0000 mg | Freq: Once | INTRAMUSCULAR | Status: AC
  Administered 2013-01-18: 4 mg via INTRAVENOUS
  Filled 2013-01-18: qty 2

## 2013-01-18 NOTE — ED Notes (Addendum)
Pt c/o nausea and epigastric pain since last night. Pt states this pain is different that heartburn pain. Pt states "This pain right here is worser and totally different." Pt states "This nausea is strong." Denies vomiting. Pt states pain is sharp, does not radiate, is worse with standing. Denies diarrhea. Pt also c/o chills. Pt states she has also had SOB earlier, denies any presently.

## 2013-01-18 NOTE — ED Notes (Signed)
PT. REPORTS UPPER ABDOMINAL PAIN WITH NAUSEA ND CHILLS ONSET LAST NIGHT , DENIES VOMITTING OR DIARRHEA.  NO DYSURIA OR FEVER.

## 2013-01-18 NOTE — ED Provider Notes (Signed)
History     CSN: [id number]  Arrival date & time 01/18/13  1955   First MD Initiated Contact with Patient 01/18/13 2123      Chief Complaint  Patient presents with  . Abdominal Pain    (Consider location/radiation/quality/duration/timing/severity/associated sxs/prior treatment) HPI  22 year old female with history of GERD, hiatal hernia, and obesity presents complaining of abdominal pain. Patient reports she developed epigastric abdominal pain since last night. Pain is described as a sharp sensation, nonradiating, much worse than her usual heartburn. She endorsed extreme nausea without vomiting or diarrhea. Reports having intermittent shortness of breath, nonexertional, no dyspnea on exertion. Endorse chills. She denies fever, headache, sneezing, coughing, running nose, sore throat, neck pain, chest pain, dysuria, vaginal discharge, back pain, or rash. Last menstrual period was January 17. She has taken her usual heartburn medication without relief. Patient denies taking birth control pill, no recent surgery, prolonged bed rest, or recent long trip. No prior history of PE or DVT.  Past Medical History  Diagnosis Date  . GERD (gastroesophageal reflux disease)   . Obesity (BMI 30-39.9)   . Hiatal hernia     Past Surgical History  Procedure Laterality Date  . Cesarean section  09/02/2010    For macrosomia, but son was 7 lb 12 oz  . Tonsillectomy and adenoidectomy      Family History  Problem Relation Age of Onset  . Diabetes Mother   . Hypertension Mother   . Obesity Mother   . Obesity Sister   . Osteochondroma Sister     History  Substance Use Topics  . Smoking status: Never Smoker   . Smokeless tobacco: Never Used  . Alcohol Use: No    OB History   Grav Para Term Preterm Abortions TAB SAB Ect Mult Living                  Review of Systems  Constitutional:       10 Systems reviewed and all are negative for acute change except as noted in the HPI.      Allergies  Review of patient's allergies indicates no known allergies.  Home Medications   Current Outpatient Rx  Name  Route  Sig  Dispense  Refill  . Fe Fum-FePoly-FA-Vit C-Vit B3 (INTEGRA F) 125-1 MG CAPS   Oral   Take 1 capsule by mouth daily.   30 capsule   11   . RABEprazole (ACIPHEX) 20 MG tablet   Oral   Take 1 tablet (20 mg total) by mouth daily.   60 tablet   3     Take 2 tablets every day     BP 114/74  Pulse 73  Temp(Src) 98.3 F (36.8 C) (Oral)  Resp 16  SpO2 98%  LMP 12/26/2012  Physical Exam  Nursing note and vitals reviewed. Constitutional: She is oriented to person, place, and time. She appears well-developed and well-nourished. No distress.  Awake, alert, nontoxic appearance  HENT:  Head: Atraumatic.  Right Ear: External ear normal.  Left Ear: External ear normal.  Nose: Nose normal.  Eyes: Conjunctivae are normal. Right eye exhibits no discharge. Left eye exhibits no discharge.  Neck: Neck supple.  Cardiovascular: Normal rate and regular rhythm.   Pulmonary/Chest: Effort normal. No respiratory distress. She exhibits no tenderness.  Abdominal: Soft. There is tenderness (Epigastric tenderness on palpation without guarding or rebound tenderness. Negative Murphy sign, no McBurney's point, no overlying skin changes, no hernia noted. No CVA tenderness.). There is no rebound.  Musculoskeletal: She exhibits no edema and no tenderness.  ROM appears intact, no obvious focal weakness  Lymphadenopathy:    She has no cervical adenopathy.  Neurological: She is alert and oriented to person, place, and time.  Mental status and motor strength appears intact  Skin: No rash noted.  Psychiatric: She has a normal mood and affect.    ED Course  Procedures (including critical care time)  Labs Reviewed  CBC WITH DIFFERENTIAL - Abnormal; Notable for the following:    Hemoglobin 11.9 (*)    HCT 35.8 (*)    MCV 74.6 (*)    MCH 24.8 (*)    RDW 16.8 (*)     All other components within normal limits  COMPREHENSIVE METABOLIC PANEL - Abnormal; Notable for the following:    GFR calc non Af Amer 87 (*)    All other components within normal limits  URINALYSIS, ROUTINE W REFLEX MICROSCOPIC  POCT PREGNANCY, URINE   Results for orders placed during the hospital encounter of 01/18/13  URINALYSIS, ROUTINE W REFLEX MICROSCOPIC      Result Value Range   Color, Urine YELLOW  YELLOW   APPearance CLEAR  CLEAR   Specific Gravity, Urine 1.035 (*) 1.005 - 1.030   pH 6.5  5.0 - 8.0   Glucose, UA NEGATIVE  NEGATIVE mg/dL   Hgb urine dipstick NEGATIVE  NEGATIVE   Bilirubin Urine NEGATIVE  NEGATIVE   Ketones, ur NEGATIVE  NEGATIVE mg/dL   Protein, ur NEGATIVE  NEGATIVE mg/dL   Urobilinogen, UA 1.0  0.0 - 1.0 mg/dL   Nitrite NEGATIVE  NEGATIVE   Leukocytes, UA NEGATIVE  NEGATIVE  CBC WITH DIFFERENTIAL      Result Value Range   WBC 10.1  4.0 - 10.5 K/uL   RBC 4.80  3.87 - 5.11 MIL/uL   Hemoglobin 11.9 (*) 12.0 - 15.0 g/dL   HCT 16.1 (*) 09.6 - 04.5 %   MCV 74.6 (*) 78.0 - 100.0 fL   MCH 24.8 (*) 26.0 - 34.0 pg   MCHC 33.2  30.0 - 36.0 g/dL   RDW 40.9 (*) 81.1 - 91.4 %   Platelets 372  150 - 400 K/uL   Neutrophils Relative 68  43 - 77 %   Neutro Abs 6.9  1.7 - 7.7 K/uL   Lymphocytes Relative 26  12 - 46 %   Lymphs Abs 2.7  0.7 - 4.0 K/uL   Monocytes Relative 4  3 - 12 %   Monocytes Absolute 0.4  0.1 - 1.0 K/uL   Eosinophils Relative 2  0 - 5 %   Eosinophils Absolute 0.2  0.0 - 0.7 K/uL   Basophils Relative 0  0 - 1 %   Basophils Absolute 0.0  0.0 - 0.1 K/uL  COMPREHENSIVE METABOLIC PANEL      Result Value Range   Sodium 136  135 - 145 mEq/L   Potassium 4.3  3.5 - 5.1 mEq/L   Chloride 102  96 - 112 mEq/L   CO2 25  19 - 32 mEq/L   Glucose, Bld 90  70 - 99 mg/dL   BUN 13  6 - 23 mg/dL   Creatinine, Ser 7.82  0.50 - 1.10 mg/dL   Calcium 8.9  8.4 - 95.6 mg/dL   Total Protein 8.1  6.0 - 8.3 g/dL   Albumin 3.6  3.5 - 5.2 g/dL   AST 7  0 - 37 U/L    ALT 8  0 - 35 U/L  Alkaline Phosphatase 71  39 - 117 U/L   Total Bilirubin 0.4  0.3 - 1.2 mg/dL   GFR calc non Af Amer 87 (*) >90 mL/min   GFR calc Af Amer >90  >90 mL/min  LIPASE, BLOOD      Result Value Range   Lipase 17  11 - 59 U/L  POCT PREGNANCY, URINE      Result Value Range   Preg Test, Ur NEGATIVE  NEGATIVE   Dg Chest 2 View  01/18/2013  *RADIOLOGY REPORT*  Clinical Data: Epigastric abdominal pain and shortness of breath.  CHEST - 2 VIEW  Comparison: None.  Findings: The lungs are well-aerated and clear.  There is no evidence of focal opacification, pleural effusion or pneumothorax.  The heart is normal in size; the mediastinal contour is within normal limits.  No acute osseous abnormalities are seen.  IMPRESSION: No acute cardiopulmonary process seen.   Original Report Authenticated By: Tonia Ghent, M.D.      9:41 PM Patient presents with epigastric tenderness, and nausea. She has a significant history of GERD, and hiatal hernia. Anticipate that this complaint is related to her chronic problem. She has multiple prior visits for abdominal pain. I do not suspect appendicitis, gallbladder etiology, or pancreatitis. No tenderness to her low abdomen concerning for peptic complication. Will obtain lab work, we'll give anti-medic, GI cocktail, and fluid hydration. Discussed with attending.  10:27 PM Patient felt better after receiving pain medication and antinausea medication. Her lab work is essentially unremarkable. No significant evidence of dehydration. Chest x-ray is unremarkable. Will continue with IV fluid and will discharge patient with close followup with her PCP. Patient voiced understanding and agrees with plan.  BP 114/74  Pulse 73  Temp(Src) 98.3 F (36.8 C) (Oral)  Resp 16  SpO2 98%  LMP 12/26/2012  .I have reviewed nursing notes and vital signs. I personally reviewed the imaging tests through PACS system  I reviewed available ER/hospitalization records thought  the EMR  1. Abdominal pain 2. nausea   MDM          Fayrene Helper, [name] 01/18/13 2340

## 2013-01-18 NOTE — ED Provider Notes (Signed)
Medical screening examination/treatment/procedure(s) were performed by non-physician practitioner and as supervising physician I was immediately available for consultation/collaboration.  Gilda Crease, MD 01/18/13 [phone]

## 2013-01-18 NOTE — ED Notes (Signed)
Pt given urine specimen cup in triage and asked to give sample while waiting on room.  Pt verbalizes understanding

## 2013-02-04 ENCOUNTER — Other Ambulatory Visit (INDEPENDENT_AMBULATORY_CARE_PROVIDER_SITE_OTHER): Payer: Medicaid Other

## 2013-02-04 DIAGNOSIS — D649 Anemia, unspecified: Secondary | ICD-10-CM

## 2013-02-04 LAB — CBC WITH DIFFERENTIAL/PLATELET
Basophils Relative: 0.5 % (ref 0.0–3.0)
Eosinophils Relative: 1.9 % (ref 0.0–5.0)
HCT: 30.5 % — ABNORMAL LOW (ref 36.0–46.0)
Hemoglobin: 9.9 g/dL — ABNORMAL LOW (ref 12.0–15.0)
Lymphs Abs: 3.1 10*3/uL (ref 0.7–4.0)
MCV: 76.1 fl — ABNORMAL LOW (ref 78.0–100.0)
Monocytes Absolute: 0.3 10*3/uL (ref 0.1–1.0)
Monocytes Relative: 4.4 % (ref 3.0–12.0)
RBC: 4 Mil/uL (ref 3.87–5.11)
WBC: 7.2 10*3/uL (ref 4.5–10.5)

## 2013-02-06 ENCOUNTER — Encounter (HOSPITAL_COMMUNITY): Payer: Self-pay | Admitting: Emergency Medicine

## 2013-02-06 ENCOUNTER — Emergency Department (HOSPITAL_COMMUNITY)
Admission: EM | Admit: 2013-02-06 | Discharge: 2013-02-06 | Disposition: A | Discharge status: Home / Self Care | Payer: Medicaid Other | Attending: Emergency Medicine | Admitting: Emergency Medicine

## 2013-02-06 DIAGNOSIS — K219 Gastro-esophageal reflux disease without esophagitis: Secondary | ICD-10-CM | POA: Insufficient documentation

## 2013-02-06 DIAGNOSIS — Z79899 Other long term (current) drug therapy: Secondary | ICD-10-CM | POA: Insufficient documentation

## 2013-02-06 DIAGNOSIS — R059 Cough, unspecified: Secondary | ICD-10-CM | POA: Insufficient documentation

## 2013-02-06 DIAGNOSIS — R509 Fever, unspecified: Secondary | ICD-10-CM | POA: Insufficient documentation

## 2013-02-06 DIAGNOSIS — Z8719 Personal history of other diseases of the digestive system: Secondary | ICD-10-CM | POA: Insufficient documentation

## 2013-02-06 DIAGNOSIS — R1310 Dysphagia, unspecified: Secondary | ICD-10-CM | POA: Insufficient documentation

## 2013-02-06 DIAGNOSIS — H9209 Otalgia, unspecified ear: Secondary | ICD-10-CM | POA: Insufficient documentation

## 2013-02-06 DIAGNOSIS — J029 Acute pharyngitis, unspecified: Secondary | ICD-10-CM | POA: Insufficient documentation

## 2013-02-06 DIAGNOSIS — J3489 Other specified disorders of nose and nasal sinuses: Secondary | ICD-10-CM | POA: Insufficient documentation

## 2013-02-06 DIAGNOSIS — M542 Cervicalgia: Secondary | ICD-10-CM | POA: Insufficient documentation

## 2013-02-06 DIAGNOSIS — R05 Cough: Secondary | ICD-10-CM | POA: Insufficient documentation

## 2013-02-06 DIAGNOSIS — E669 Obesity, unspecified: Secondary | ICD-10-CM | POA: Insufficient documentation

## 2013-02-06 MED ORDER — AMOXICILLIN 500 MG PO CAPS
500.0000 mg | ORAL_CAPSULE | Freq: Once | ORAL | Status: AC
  Administered 2013-02-06: 500 mg via ORAL
  Filled 2013-02-06: qty 1

## 2013-02-06 MED ORDER — GUAIFENESIN-CODEINE 100-10 MG/5ML PO SOLN
5.0000 mL | Freq: Once | ORAL | Status: AC
  Administered 2013-02-06: 5 mL via ORAL
  Filled 2013-02-06: qty 5

## 2013-02-06 MED ORDER — GUAIFENESIN-CODEINE 100-10 MG/5ML PO SYRP: 5.0000 mL | ORAL_SOLUTION | Freq: Three times a day (TID) | ORAL | Status: DC | PRN

## 2013-02-06 MED ORDER — AMOXICILLIN 500 MG PO CAPS: 500.0000 mg | ORAL_CAPSULE | Freq: Three times a day (TID) | ORAL | Status: DC

## 2013-02-06 NOTE — ED Notes (Signed)
Pt and mother verbalized understanding of d/c instructions and follow up instructions. Pt ambulatory to d/c window.

## 2013-02-06 NOTE — ED Provider Notes (Signed)
History    This chart was scribed for non-physician practitioner working with Suzi Roots, MD by Melba Coon, ED Scribe. This patient was seen in room WTR8/WTR8 and the patient's care was started at 11:10PM.    CSN: [id number]  Arrival date & time 02/06/13  2226   First MD Initiated Contact with Patient 02/06/13 2252      Chief Complaint  Patient presents with  . Otalgia  . Sore Throat    (Consider location/radiation/quality/duration/timing/severity/associated sxs/prior treatment) The history is provided by the patient. No language interpreter was used.   Veronica [name] is a 22 y.o. female who presents to the Emergency Department complaining of persistent, moderate to severe fever with associated sore throat and otalgia with a gradual onset 2 days that has gotten progressively worse. Fever here at the ED today is 100.4. She rates the severity of the sore throat a 6/10. She reports nasal congestion and cough with mild neck pain. She reports dysphagia secondary to pain. Denies rash, back pain, CP, SOB, abdominal pain, nausea, emesis, diarrhea, dysuria, or extremity pain, edema, weakness, numbness, or tingling. No known allergies. No other pertinent medical symptoms.  PCP [name]  Past Medical History  Diagnosis Date  . GERD (gastroesophageal reflux disease)   . Obesity (BMI 30-39.9)   . Hiatal hernia     Past Surgical History  Procedure Laterality Date  . Cesarean section  09/02/2010    For macrosomia, but son was 7 lb 12 oz  . Tonsillectomy and adenoidectomy      Family History  Problem Relation Age of Onset  . Diabetes Mother   . Hypertension Mother   . Obesity Mother   . Obesity Sister   . Osteochondroma Sister     History  Substance Use Topics  . Smoking status: Never Smoker   . Smokeless tobacco: Never Used  . Alcohol Use: No    OB History   Grav Para Term Preterm Abortions TAB SAB Ect Mult Living                  Review of  Systems  HENT: Positive for ear pain.    10 Systems reviewed and all are negative for acute change except as noted in the HPI.   Allergies  Morphine and related  Home Medications   Current Outpatient Rx  Name  Route  Sig  Dispense  Refill  . esomeprazole (NEXIUM) 40 MG capsule   Oral   Take 40 mg by mouth daily before breakfast.         . Fe Fum-FePoly-FA-Vit C-Vit B3 (INTEGRA F) 125-1 MG CAPS   Oral   Take 1 capsule by mouth daily.   30 capsule   11   . ondansetron (ZOFRAN) 4 MG tablet   Oral   Take 1 tablet (4 mg total) by mouth every 6 (six) hours.   12 tablet   0   . amoxicillin (AMOXIL) 500 MG capsule   Oral   Take 1 capsule (500 mg total) by mouth 3 (three) times daily.   21 capsule   0   . guaiFENesin-codeine (ROBITUSSIN AC) 100-10 MG/5ML syrup   Oral   Take 5 mLs by mouth 3 (three) times daily as needed for cough.   120 mL   0     BP 124/71  Pulse 91  Temp(Src) 100.4 F (38 C) (Oral)  Resp 18  SpO2 100%  LMP 01/21/2013  Physical Exam  Nursing note  and vitals reviewed. Constitutional: She is oriented to person, place, and time. She appears well-developed.  HENT:  Head: Normocephalic and atraumatic.  Oropharynx is mildly erythematous without pustules. Bilateral TMs are obscured by cerumen.   Eyes: Conjunctivae and EOM are normal. No scleral icterus.  Neck: Neck supple. No thyromegaly present.  Cardiovascular: Normal rate and regular rhythm.  Exam reveals no gallop and no friction rub.   No murmur heard. Pulmonary/Chest: No stridor. She has no wheezes. She has no rales. She exhibits no tenderness.  Abdominal: Soft. Bowel sounds are normal. She exhibits no distension. There is no tenderness. There is no rebound.  Musculoskeletal: Normal range of motion. She exhibits no edema.  Lymphadenopathy:    She has no cervical adenopathy.  Neurological: She is oriented to person, place, and time. Coordination normal.  Skin: No rash noted. No erythema.   Psychiatric: She has a normal mood and affect. Her behavior is normal.    ED Course  Procedures (including critical care time)  DIAGNOSTIC STUDIES: Oxygen Saturation is 100% on room air, normal by my interpretation.    COORDINATION OF CARE:  11:15PM - Amoxicillin and Robitussin AC will be prescribed for [facility] [place]. She is advised to only take ibuprofen if she is working, driving, or taking care of kids. She is ready for d/c.    Labs Reviewed - No data to display No results found.   1. Pharyngitis       MDM  I personally performed the services described in this documentation, which was scribed in my presence. The recorded information has been reviewed and is accurate.        Dorthula Matas, PA 02/07/13 [phone]

## 2013-02-06 NOTE — ED Notes (Signed)
Pt c/o ear pain that started on Friday,worse today with fever and throat pain.No N/V/D

## 2013-02-08 ENCOUNTER — Telehealth: Payer: Self-pay | Admitting: *Deleted

## 2013-02-08 NOTE — ED Provider Notes (Signed)
Medical screening examination/treatment/procedure(s) were performed by non-physician practitioner and as supervising physician I was immediately available for consultation/collaboration.   Kevin E Steinl, MD 02/08/13 0728 

## 2013-02-08 NOTE — Telephone Encounter (Signed)
Message copied by Florene Glen on Tue Feb 08, 2013  4:41 PM ------      Message from: Beverley Fiedler      Created: Mon Feb 07, 2013  3:10 PM       Pt remains anemia.  Please schedule OV to discuss further.  We may refer to heme ------

## 2013-02-08 NOTE — Telephone Encounter (Signed)
Informed pt she remains anemic and Dr Rhea Belton would like to see her. Pt states she doesn't know when she can get off. She will call back and schedule. Stressed to her the importance of the matter.

## 2013-02-16 ENCOUNTER — Encounter: Payer: Self-pay | Admitting: Internal Medicine

## 2013-02-16 ENCOUNTER — Ambulatory Visit (INDEPENDENT_AMBULATORY_CARE_PROVIDER_SITE_OTHER): Payer: Medicaid Other | Admitting: Internal Medicine

## 2013-02-16 VITALS — BP 110/62 | HR 68 | Ht 62.5 in | Wt 270.4 lb

## 2013-02-16 DIAGNOSIS — D509 Iron deficiency anemia, unspecified: Secondary | ICD-10-CM

## 2013-02-16 DIAGNOSIS — D649 Anemia, unspecified: Secondary | ICD-10-CM

## 2013-02-16 DIAGNOSIS — K219 Gastro-esophageal reflux disease without esophagitis: Secondary | ICD-10-CM

## 2013-02-16 DIAGNOSIS — R358 Other polyuria: Secondary | ICD-10-CM

## 2013-02-16 MED ORDER — CARBAMIDE PEROXIDE 6.5 % OT SOLN: 5.0000 [drp] | Freq: Two times a day (BID) | OTIC | Status: DC

## 2013-02-16 NOTE — Patient Instructions (Addendum)
Your physician has requested that you go to the basement for  lab work before leaving today  We have sent the following medications to your pharmacy for you to pick up at your convenience: Integra; continue taking; Carbamide Peroxide Tilt head sidways, 5-10 drops twice a day for 4 days keep drops in for several minutes by tilting head and using a cotton ball.

## 2013-02-16 NOTE — Progress Notes (Signed)
Subjective:    Patient ID: Veronica [name], female    DOB: 04-05-1991, 22 y.o.   MRN: [id number]  HPI Ms. Krygier is a 22 yo female with PMH of GERD and obesity who is seen in followup. She was initially seen for GERD abdominal pain in September and underwent endoscopy and flexible sigmoidoscopy on 08/13/2012. EGD revealed LA grade a esophagitis, 6 cm hiatal hernia and was otherwise unremarkable. Flex will sigmoidoscopy was normal to the descending colon. Her PPI was switched from omeprazole to Nexium 40 mg daily and with this she's continued to have improvement in her heartburn and epigastric abdominal pain since. Overall she is feeling well, she is continued on iron replacement therapy daily. She does report continued nocturia and fatigue. She is able to work on a daily basis and is doing so at [employer].  Her mother is concerned about her weight and the fact that she may develop diabetes if she has a strong family history of diabetes. No further rectal bleeding or melena. No nausea or vomiting. No abdominal pain  Review of Systems As per history of present illness, otherwise negative  Current Medications, Allergies, Past Medical History, Past Surgical History, Family History and Social History were reviewed in [employer] record.     Objective:   Physical Exam BP 110/62  Pulse 68  Ht 5' 2.5" (1.588 m)  Wt 270 lb 6 oz (122.641 kg)  BMI 48.63 kg/m2  LMP 01/21/2013 Constitutional: Well-developed and well-nourished. No distress. HEENT: Normocephalic and atraumatic.   No scleral icterus. Cardiovascular: Normal rate, regular rhythm and intact distal pulses.  Pulmonary/chest: Effort normal and breath sounds normal. No wheezing, rales or rhonchi. Abdominal: Soft, obese, nontender, nondistended. Bowel sounds active throughout.  Extremities: no clubbing, cyanosis, or edema Neurological: Alert and oriented to person place and time. Skin: Skin is warm and dry. No rashes  noted. Psychiatric: Normal mood and affect. Behavior is normal.  CBC    Component Value Date/Time   WBC 7.2 02/04/2013 1537   RBC 4.00 02/04/2013 1537   HGB 9.9* 02/04/2013 1537   HCT 30.5* 02/04/2013 1537   PLT 342.0 02/04/2013 1537   MCV 76.1* 02/04/2013 1537   MCH 24.8* 01/18/2013 2026   MCHC 32.5 02/04/2013 1537   RDW 16.7* 02/04/2013 1537   LYMPHSABS 3.1 02/04/2013 1537   MONOABS 0.3 02/04/2013 1537   EOSABS 0.1 02/04/2013 1537   BASOSABS 0.0 02/04/2013 1537    Iron/TIBC/Ferritin    Component Value Date/Time   IRON 26* 09/21/2012 1453   FERRITIN 11.0 09/21/2012 1453       Assessment & Plan:   22 yo female with PMH of GERD and obesity who is seen in followup.  1.  GERD -- well controlled on Nexium daily. She will continue with this at 40 mg daily for now.  2.  Anemia, hx of iron def -- she's been on iron replacement for months now and her hemoglobin has remained low at 9.9 most recently. I would like for her to be seen by hematology to evaluate this anemia further and consider IV iron.  Of course menstruation could contribute to her anemia, but has not responded to iron replacement as I had hoped. For now she will continue Integra daily.  3.  Obesity -- we have discussed trying to reduce the amount of carbohydrates in her diet. It seems that she is drinking sodas frequently and I have recommended diet soda. She says she likes the taste of diet soda  and will try switching to diet sodas exclusively.  She is high risk for the development of diabetes, and I will order a fasting Chem-7. Also will check A1c. Since she is having these labs I will check iron studies again.  She will followup as needed

## 2013-02-17 ENCOUNTER — Telehealth: Payer: Self-pay | Admitting: Internal Medicine

## 2013-02-17 NOTE — Telephone Encounter (Signed)
C/D 02/17/13 for appt. 03/16/13

## 2013-02-17 NOTE — Telephone Encounter (Signed)
S/W pt in re NP appt 04/09 @ 1:30 w/Dr. Arbutus Ped.  Referring - [facility] Dx- Anemia Welcome packet mailed.

## 2013-02-28 ENCOUNTER — Telehealth: Payer: Self-pay | Admitting: Internal Medicine

## 2013-02-28 ENCOUNTER — Encounter (HOSPITAL_COMMUNITY): Payer: Self-pay | Admitting: Emergency Medicine

## 2013-02-28 ENCOUNTER — Emergency Department (INDEPENDENT_AMBULATORY_CARE_PROVIDER_SITE_OTHER)
Admission: EM | Admit: 2013-02-28 | Discharge: 2013-02-28 | Disposition: A | Discharge status: Home / Self Care | Payer: Medicaid Other | Source: Home / Self Care | Attending: Emergency Medicine | Admitting: Emergency Medicine

## 2013-02-28 DIAGNOSIS — H6120 Impacted cerumen, unspecified ear: Secondary | ICD-10-CM

## 2013-02-28 DIAGNOSIS — H6123 Impacted cerumen, bilateral: Secondary | ICD-10-CM

## 2013-02-28 NOTE — Telephone Encounter (Signed)
The patient's mother called this evening regarding eardrops, supposedly recommended by Dr. Rhea Belton. She states it was for ear  wax. Not sure if it was working. I deferred questions regarding this for routine office hours.

## 2013-02-28 NOTE — ED Notes (Signed)
Pt c/o bilateral ear fullness and pain in right ear x 1 month. Pt has used otc ear drops with out relief. Pt denies fever and any other symptoms.

## 2013-02-28 NOTE — ED Provider Notes (Signed)
History     CSN: [id number]  Arrival date & time 02/28/13  1847   First MD Initiated Contact with Patient 02/28/13 1853      Chief Complaint  Patient presents with  . Ear Fullness    bilateral ear fullness and pain in right ear x 1 month.     (Consider location/radiation/quality/duration/timing/severity/associated sxs/prior treatment) HPI Comments: Patient presents urgent care this evening complaining that for about a month has been having bilateral your fullness and discomfort mostly on the right ear can barely hear anything out of it. She denies any fevers, ear discharge ear trauma or injury and no recent, upper respiratory symptoms.  Patient is a 22 y.o. female presenting with plugged ear sensation. The history is provided by the patient.  Ear Fullness This is a new problem. The current episode started more than 1 week ago. The problem occurs constantly. The problem has not changed since onset.Pertinent negatives include no abdominal pain and no headaches. Nothing aggravates the symptoms. She has tried nothing (Try some over-the-counter ear drops) for the symptoms.    Past Medical History  Diagnosis Date  . GERD (gastroesophageal reflux disease)   . Obesity (BMI 30-39.9)   . Hiatal hernia   . Anemia     Past Surgical History  Procedure Laterality Date  . Cesarean section  09/02/2010    For macrosomia, but son was 7 lb 12 oz  . Tonsillectomy and adenoidectomy      Family History  Problem Relation Age of Onset  . Diabetes Mother   . Hypertension Mother   . Obesity Mother   . Obesity Sister   . Osteochondroma Sister     History  Substance Use Topics  . Smoking status: Never Smoker   . Smokeless tobacco: Never Used  . Alcohol Use: No    OB History   Grav Para Term Preterm Abortions TAB SAB Ect Mult Living                  Review of Systems  Constitutional: Negative for fever, chills, activity change, appetite change and fatigue.  HENT: Positive for ear  pain. Negative for nosebleeds, congestion, sore throat, trouble swallowing, neck stiffness and tinnitus.   Gastrointestinal: Negative for abdominal pain.  Neurological: Negative for dizziness, weakness and headaches.    Allergies  Morphine and related  Home Medications   Current Outpatient Rx  Name  Route  Sig  Dispense  Refill  . esomeprazole (NEXIUM) 40 MG capsule   Oral   Take 40 mg by mouth daily before breakfast.         . Fe Fum-FePoly-FA-Vit C-Vit B3 (INTEGRA F) 125-1 MG CAPS   Oral   Take 1 capsule by mouth daily.   30 capsule   11   . amoxicillin (AMOXIL) 500 MG capsule   Oral   Take 1 capsule (500 mg total) by mouth 3 (three) times daily.   21 capsule   0   . carbamide peroxide (DEBROX) 6.5 % otic solution   Both Ears   Place 5 drops into both ears 2 (two) times daily.   15 mL   0   . guaiFENesin-codeine (ROBITUSSIN AC) 100-10 MG/5ML syrup   Oral   Take 5 mLs by mouth 3 (three) times daily as needed for cough.   120 mL   0   . ondansetron (ZOFRAN) 4 MG tablet   Oral   Take 1 tablet (4 mg total) by mouth every 6 (six) hours.  12 tablet   0     BP 119/75  Pulse 88  Temp(Src) 98.7 F (37.1 C) (Oral)  Resp 20  SpO2 97%  LMP 02/17/2013  Physical Exam  Nursing note and vitals reviewed. Constitutional: Vital signs are normal. She appears well-developed and well-nourished.  Non-toxic appearance. She does not have a sickly appearance. She does not appear ill. No distress.  HENT:  Head: Normocephalic.  Right Ear: Tympanic membrane normal.  Left Ear: Tympanic membrane normal.  Ears:  Mouth/Throat: No oropharyngeal exudate.  Eyes: Conjunctivae are normal. Pupils are equal, round, and reactive to light. Right eye exhibits no discharge. Left eye exhibits no discharge. No scleral icterus.  Neck: Neck supple. No JVD present.  Lymphadenopathy:    She has no cervical adenopathy.  Skin: No rash noted. No erythema.    ED Course  Procedures (including  critical care time)  Labs Reviewed - No data to display No results found.   1. Cerumen impaction, bilateral       MDM  Bilateral cerumen impaction, effectively removed after bilateral ear irrigation.        Jimmie Molly, MD 02/28/13 [id number]

## 2013-03-16 ENCOUNTER — Other Ambulatory Visit (HOSPITAL_BASED_OUTPATIENT_CLINIC_OR_DEPARTMENT_OTHER): Payer: Medicaid Other | Admitting: Lab

## 2013-03-16 ENCOUNTER — Encounter: Payer: Self-pay | Admitting: Internal Medicine

## 2013-03-16 ENCOUNTER — Ambulatory Visit: Payer: Medicaid Other

## 2013-03-16 ENCOUNTER — Telehealth: Payer: Self-pay | Admitting: Internal Medicine

## 2013-03-16 ENCOUNTER — Ambulatory Visit: Payer: Medicaid Other | Admitting: Internal Medicine

## 2013-03-16 ENCOUNTER — Ambulatory Visit (HOSPITAL_BASED_OUTPATIENT_CLINIC_OR_DEPARTMENT_OTHER): Payer: Medicaid Other | Admitting: Internal Medicine

## 2013-03-16 ENCOUNTER — Other Ambulatory Visit: Payer: Medicaid Other | Admitting: Lab

## 2013-03-16 VITALS — BP 125/80 | HR 68 | Temp 98.3°F | Resp 20 | Ht 62.5 in | Wt 270.7 lb

## 2013-03-16 DIAGNOSIS — D509 Iron deficiency anemia, unspecified: Secondary | ICD-10-CM

## 2013-03-16 DIAGNOSIS — D649 Anemia, unspecified: Secondary | ICD-10-CM

## 2013-03-16 LAB — CBC & DIFF AND RETIC
BASO%: 0.2 % (ref 0.0–2.0)
Eosinophils Absolute: 0.1 10*3/uL (ref 0.0–0.5)
LYMPH%: 38.1 % (ref 14.0–49.7)
MCHC: 33 g/dL (ref 31.5–36.0)
MCV: 75.3 fL — ABNORMAL LOW (ref 79.5–101.0)
MONO#: 0.3 10*3/uL (ref 0.1–0.9)
MONO%: 3.9 % (ref 0.0–14.0)
NEUT#: 4.7 10*3/uL (ref 1.5–6.5)
Platelets: 322 10*3/uL (ref 145–400)
RBC: 4.3 10*6/uL (ref 3.70–5.45)
RDW: 16 % — ABNORMAL HIGH (ref 11.2–14.5)
Retic %: 1.74 % (ref 0.70–2.10)
Retic Ct Abs: 74.82 10*3/uL (ref 33.70–90.70)
WBC: 8.4 10*3/uL (ref 3.9–10.3)

## 2013-03-16 LAB — IRON AND TIBC: %SAT: 12 % — ABNORMAL LOW (ref 20–55)

## 2013-03-16 LAB — FERRITIN: Ferritin: 19 ng/mL (ref 10–291)

## 2013-03-16 LAB — VITAMIN B12: Vitamin B-12: 558 pg/mL (ref 211–911)

## 2013-03-16 NOTE — Progress Notes (Signed)
I called DSS checking the status of her medicaid. Per Veronica [name] she shows still pending. She gave me case worker Veronica [name]'s phs# [phone]. I called and left her a message to call me back. I will send the patient an application for assistance until medicaid comes thru. I will call her and let her know.- not sure if still in office.

## 2013-03-16 NOTE — Telephone Encounter (Signed)
gv and printed appt schedule for pt for June °

## 2013-03-16 NOTE — Progress Notes (Unsigned)
Checked in new patient. She was late and [name] said to bring over. She is waiting for her medicaid card in the mail. She know she has again Veronica [name] access.

## 2013-03-16 NOTE — Patient Instructions (Signed)
You have iron deficiency anemia. Continue on Integra plus. Followup in 2 months

## 2013-03-16 NOTE — Progress Notes (Signed)
[facility] [phone]   Fax:([phone]  CONSULT NOTE  REFERRING PHYSICIAN: Dr. Erick Blinks.  REASON FOR CONSULTATION:  22 years old [place] American female with persistent anemia.  HPI Veronica [name] is a 22 y.o. female with no significant past medical history who has been under evaluation by gastroenterology for persistent anemia. She underwent upper endoscopy as well as flexible sigmoidoscopy on 08/13/2012 by Dr. Rhea Belton. The upper endoscopy showed only thoracic aorta the visualized with 6 CM hiatal hernia and no other abnormalities. The flexible sigmoidoscopy was unremarkable. The patient had iron study performed in the past and showed iron deficiency. She was started on treatment with Integra plus over the last 3 months. She also has several lab work for celiac disease that was unremarkable. She was referred to me today for evaluation and recommendation regarding her condition. Her menstrual period usually lost around 6 days and sometimes irregular.  The patient denied having any specific complaints today. She denied having any dizzy spells. She denied having any fatigue or weakness. She has no other bleeding issues. She is tolerating her treatment with Integra plus fairly well with no significant adverse effects. Family history is unremarkable for any sickle cell disease or other hemoglobinopathy. The patient is single and has one son age 45 years. She works at [employer]. There is no history of smoking but drinks alcohol occasionally and no history of drug abuse. @SFHPI @  Past Medical History  Diagnosis Date  . GERD (gastroesophageal reflux disease)   . Obesity (BMI 30-39.9)   . Hiatal hernia   . Anemia     Past Surgical History  Procedure Laterality Date  . Cesarean section  09/02/2010    For macrosomia, but son was 7 lb 12 oz  . Tonsillectomy and adenoidectomy      Family History  Problem Relation Age of Onset  . Diabetes Mother   .  Hypertension Mother   . Obesity Mother   . Obesity Sister   . Osteochondroma Sister     Social History History  Substance Use Topics  . Smoking status: Never Smoker   . Smokeless tobacco: Never Used  . Alcohol Use: No    Allergies  Allergen Reactions  . Morphine And Related     Causes "chest to be heavy"    Current Outpatient Prescriptions  Medication Sig Dispense Refill  . esomeprazole (NEXIUM) 40 MG capsule Take 40 mg by mouth daily before breakfast.      . Fe Fum-FePoly-FA-Vit C-Vit B3 (INTEGRA F) 125-1 MG CAPS Take 1 capsule by mouth daily.  30 capsule  11   No current facility-administered medications for this visit.    Review of Systems  A comprehensive review of systems was negative.  Physical Exam  AVW:UJWJX, healthy, no distress, well nourished and well developed SKIN: skin color, texture, turgor are normal HEAD: Normocephalic, No masses, lesions, tenderness or abnormalities EYES: normal, PERRLA EARS: External ears normal OROPHARYNX:no exudate and no erythema  NECK: supple, no adenopathy LYMPH:  no palpable lymphadenopathy, no hepatosplenomegaly LUNGS: clear to auscultation  HEART: regular rate & rhythm, no murmurs and no gallops ABDOMEN:abdomen soft, non-tender, normal bowel sounds and no masses or organomegaly BACK: Back symmetric, no curvature. EXTREMITIES:no edema, no skin discoloration  NEURO: alert & oriented x 3 with fluent speech, no focal motor/sensory deficits  PERFORMANCE STATUS: ECOG 0  LABORATORY DATA: Lab Results  Component Value Date   WBC 8.4 03/16/2013   HGB 10.7* 03/16/2013  HCT 32.4* 03/16/2013   MCV 75.3* 03/16/2013   PLT 322 03/16/2013      Chemistry      Component Value Date/Time   NA 136 01/18/2013 2026   K 4.3 01/18/2013 2026   CL 102 01/18/2013 2026   CO2 25 01/18/2013 2026   BUN 13 01/18/2013 2026   CREATININE 0.94 01/18/2013 2026   CREATININE 0.88 09/23/2012 1634      Component Value Date/Time   CALCIUM 8.9 01/18/2013 2026     ALKPHOS 71 01/18/2013 2026   AST 7 01/18/2013 2026   ALT 8 01/18/2013 2026   BILITOT 0.4 01/18/2013 2026       RADIOGRAPHIC STUDIES: No results found.  ASSESSMENT: This is a very pleasant 22 years old [place] American female with persistent iron deficiency anemia with hemoglobin ranging between 10 to 12 g/dL. Her gastrointestinal evaluation was unremarkable and the patient has no evidence for celiac disease. This could be secondary to blood loss from her menstruation.   PLAN: I have a lengthy discussion with the patient and her mother today about her condition. Her hemoglobin is stable today at 10.7 g/dL.  The patient is tolerating her oral iron tablet fairly well. I ordered several studies today to evaluate her anemia including repeat iron study and ferritin. I advised her to continue on iron tablets for now with Integra plus. I would see her back for followup visit in 2 months with repeat CBC iron study as well as hemoglobin electrophoresis. She was advised to call me immediately if she has any concerning symptoms in the interval. If no improvement in his oral iron tablet, I would consider the patient for intravenous Feraheme infusion.  All questions were answered. The patient knows to call the clinic with any problems, questions or concerns. We can certainly see the patient much sooner if necessary.  Thank you so much for allowing me to participate in the care of Chyler R Goodridge. I will continue to follow up the patient with you and assist in her care.  I spent 30 minutes counseling the patient face to face. The total time spent in the appointment was 55 minutes.  Vallie Teters K. 03/16/2013, 5:51 PM

## 2013-03-29 ENCOUNTER — Encounter: Payer: Self-pay | Admitting: Internal Medicine

## 2013-03-29 NOTE — Progress Notes (Signed)
Patient called and left a message that she finally received her medicaid [place] access card. Issue date 03/23/13 [id number]  [facility] is her PCP listed. I advised her would get entered for her.

## 2013-05-17 ENCOUNTER — Other Ambulatory Visit: Payer: Medicaid Other

## 2013-05-23 ENCOUNTER — Other Ambulatory Visit: Payer: Self-pay | Admitting: *Deleted

## 2013-05-24 ENCOUNTER — Ambulatory Visit: Payer: Medicaid Other | Admitting: Internal Medicine

## 2013-05-24 ENCOUNTER — Other Ambulatory Visit: Payer: Medicaid Other | Admitting: Lab

## 2013-05-24 ENCOUNTER — Telehealth: Payer: Self-pay | Admitting: Internal Medicine

## 2013-05-24 NOTE — Telephone Encounter (Signed)
lvm for pt regarding to lab appt b4 visit today

## 2013-06-24 ENCOUNTER — Encounter (HOSPITAL_COMMUNITY): Payer: Self-pay | Admitting: Emergency Medicine

## 2013-06-24 ENCOUNTER — Emergency Department (HOSPITAL_COMMUNITY)
Admission: EM | Admit: 2013-06-24 | Discharge: 2013-06-24 | Disposition: A | Discharge status: Home / Self Care | Payer: Medicaid Other | Attending: Emergency Medicine | Admitting: Emergency Medicine

## 2013-06-24 DIAGNOSIS — M79609 Pain in unspecified limb: Secondary | ICD-10-CM | POA: Insufficient documentation

## 2013-06-24 DIAGNOSIS — M76829 Posterior tibial tendinitis, unspecified leg: Secondary | ICD-10-CM | POA: Insufficient documentation

## 2013-06-24 DIAGNOSIS — Z8719 Personal history of other diseases of the digestive system: Secondary | ICD-10-CM | POA: Insufficient documentation

## 2013-06-24 DIAGNOSIS — Z862 Personal history of diseases of the blood and blood-forming organs and certain disorders involving the immune mechanism: Secondary | ICD-10-CM | POA: Insufficient documentation

## 2013-06-24 DIAGNOSIS — E669 Obesity, unspecified: Secondary | ICD-10-CM | POA: Insufficient documentation

## 2013-06-24 DIAGNOSIS — K219 Gastro-esophageal reflux disease without esophagitis: Secondary | ICD-10-CM | POA: Insufficient documentation

## 2013-06-24 DIAGNOSIS — M76821 Posterior tibial tendinitis, right leg: Secondary | ICD-10-CM

## 2013-06-24 DIAGNOSIS — Z79899 Other long term (current) drug therapy: Secondary | ICD-10-CM | POA: Insufficient documentation

## 2013-06-24 DIAGNOSIS — G8929 Other chronic pain: Secondary | ICD-10-CM | POA: Insufficient documentation

## 2013-06-24 DIAGNOSIS — M722 Plantar fascial fibromatosis: Secondary | ICD-10-CM

## 2013-06-24 HISTORY — DX: Pain in unspecified ankle and joints of unspecified foot: M25.579

## 2013-06-24 MED ORDER — NAPROXEN 500 MG PO TABS: 500.0000 mg | ORAL_TABLET | Freq: Two times a day (BID) | ORAL | Status: DC

## 2013-06-24 NOTE — ED Provider Notes (Signed)
This chart was scribed for Iona Coach, a non-physician practitioner working with Doug Sou, MD by Lewanda Rife, ED Scribe. This patient was seen in room TR07C/TR07C and the patient's care was started at 2000.    History    CSN: [id number] Arrival date & time 06/24/13  1910  First MD Initiated Contact with Patient 06/24/13 1920     Chief Complaint  Patient presents with  . Ankle Pain   (Consider location/radiation/quality/duration/timing/severity/associated sxs/prior Treatment) The history is provided by the patient.   HPI Comments: Veronica [name] is a 22 y.o. female who presents to the Emergency Department complaining of chronic moderate right ankle pain onset 1 year. Describes pain as sharp. Reports associated right heel pain. Reports pain is aggravated in the morning and alleviated by nothing. Denies associated recent injury, and fever. Denies taking any medications PTA to relieve symptoms.    Past Medical History  Diagnosis Date  . GERD (gastroesophageal reflux disease)   . Obesity (BMI 30-39.9)   . Hiatal hernia   . Anemia   . Ankle pain    Past Surgical History  Procedure Laterality Date  . Cesarean section  09/02/2010    For macrosomia, but son was 7 lb 12 oz  . Tonsillectomy and adenoidectomy     Family History  Problem Relation Age of Onset  . Diabetes Mother   . Hypertension Mother   . Obesity Mother   . Obesity Sister   . Osteochondroma Sister    History  Substance Use Topics  . Smoking status: Never Smoker   . Smokeless tobacco: Never Used  . Alcohol Use: No   OB History   Grav Para Term Preterm Abortions TAB SAB Ect Mult Living                 Review of Systems  Musculoskeletal:       Right ankle pain   All other systems reviewed and are negative.   A complete 10 system review of systems was obtained and all systems are negative except as noted in the HPI and PMH.    Allergies  Morphine and related  Home Medications    Current Outpatient Rx  Name  Route  Sig  Dispense  Refill  . esomeprazole (NEXIUM) 40 MG capsule   Oral   Take 40 mg by mouth daily before breakfast.         . Fe Fum-FePoly-FA-Vit C-Vit B3 (INTEGRA F) 125-1 MG CAPS   Oral   Take 1 capsule by mouth daily.   30 capsule   11    BP 109/64  Pulse 90  Temp(Src) 98 F (36.7 C) (Oral)  Resp 20  SpO2 97%  LMP 06/03/2013 Physical Exam  Nursing note and vitals reviewed. Constitutional: She is oriented to person, place, and time. She appears well-developed and well-nourished. No distress.  HENT:  Head: Normocephalic and atraumatic.  Eyes: EOM are normal.  Neck: Neck supple. No tracheal deviation present.  Cardiovascular: Normal rate.   Pulmonary/Chest: Effort normal. No respiratory distress.  Musculoskeletal: Normal range of motion.       Right ankle: Achilles tendon normal.  Tenderness over right posterior tibial tendons, and plantar fascia. No bony tenderness. Pain with toe flexion. Pain with foot dorsiflexion and plantarflexion  Neurological: She is alert and oriented to person, place, and time.  Skin: Skin is warm and dry.  Psychiatric: She has a normal mood and affect. Her behavior is normal.    ED Course  Procedures (including critical care time) Medications - No data to display  Labs Reviewed - No data to display No results found.   1. Plantar fasciitis, right   2. Posterior tibial tendonitis, right     MDM  Pt with chronic right foot pain for a year. Exam consistent with plantar fascitis and posterior tibial tendonitis. Given some pointers such as proper supportive shoes, inserts, ice massage. NSAIDs. Follow up with pcp.   Filed Vitals:   06/24/13 1914  BP: 109/64  Pulse: 90  Temp: 98 F (36.7 C)  TempSrc: Oral  Resp: 20  SpO2: 97%   I personally performed the services described in this documentation, which was scribed in my presence. The recorded information has been reviewed and is  accurate.    Lottie Mussel, [name] 06/25/13 0009

## 2013-06-24 NOTE — ED Notes (Signed)
Pt states she tore ligaments in R ankle 1 year ago and has continued to have R ankle pain.  Pain worse over the last 2 months.  Denies recent injury. CMS intact.

## 2013-06-25 NOTE — ED Provider Notes (Signed)
Medical screening examination/treatment/procedure(s) were performed by non-physician practitioner and as supervising physician I was immediately available for consultation/collaboration.  Drevion Offord, MD 06/25/13 0140 

## 2013-08-24 ENCOUNTER — Other Ambulatory Visit (HOSPITAL_COMMUNITY)
Admission: RE | Admit: 2013-08-24 | Discharge: 2013-08-24 | Disposition: A | Discharge status: Home / Self Care | Payer: Medicaid Other | Source: Ambulatory Visit | Attending: Family Medicine | Admitting: Family Medicine

## 2013-08-24 ENCOUNTER — Ambulatory Visit (INDEPENDENT_AMBULATORY_CARE_PROVIDER_SITE_OTHER): Payer: Medicaid Other | Admitting: Family Medicine

## 2013-08-24 VITALS — BP 130/73 | HR 80 | Temp 98.4°F | Ht 62.5 in | Wt 275.0 lb

## 2013-08-24 DIAGNOSIS — Z202 Contact with and (suspected) exposure to infections with a predominantly sexual mode of transmission: Secondary | ICD-10-CM

## 2013-08-24 DIAGNOSIS — N760 Acute vaginitis: Secondary | ICD-10-CM

## 2013-08-24 DIAGNOSIS — Z20828 Contact with and (suspected) exposure to other viral communicable diseases: Secondary | ICD-10-CM

## 2013-08-24 DIAGNOSIS — Z113 Encounter for screening for infections with a predominantly sexual mode of transmission: Secondary | ICD-10-CM | POA: Insufficient documentation

## 2013-08-24 DIAGNOSIS — Z9189 Other specified personal risk factors, not elsewhere classified: Secondary | ICD-10-CM

## 2013-08-24 LAB — RPR

## 2013-08-24 LAB — POCT WET PREP (WET MOUNT)

## 2013-08-24 NOTE — Progress Notes (Signed)
  Subjective:    Patient ID: Veronica [name], female    DOB: 1991/11/04, 22 y.o.   MRN: [id number]  HPI  VAGINAL DISCHARGE/Concern for STD eposure  Onset: 2-3 weeks  Description: thin Odor: non  Itching: yes  Symptoms Dysuria: no  Bleeding: no  Pelvic pain: no  Back pain: no  Fever: no  Genital sores: no  Rash: no  Dyspareunia: no  GI Sxs: no  Prior treatment: no   Red Flags: Missed period: no - August 04, 2013 Pregnancy: no  Recent antibiotics: no  Sexual activity: yes  Possible STD exposure: yes  IUD: no  Diabetes: no, but patient concerned about blood sugar given fam hx of diabetes  Fam Med Hx: mother and sister with Type 2 DM  Social Hx: patient works at [employer]    Review of Systems See HPI    Objective:   Physical Exam BP 130/73  Pulse 80  Temp(Src) 98.4 F (36.9 C) (Oral)  Ht 5' 2.5" (1.588 m)  Wt 275 lb (124.739 kg)  BMI 49.47 kg/m2  LMP 08/04/2013 Gen: young AAF, morbidly obese, non distress, pleasant and conversant Abd: protuberant, soft, non distended, non tender, normoactive bowel sounds GU: > External: no lesions > Vagina: no blood in vault > Cervix: no lesion; no mucopurulent d/c; no motion tenderness > Uterus: small, mobile > Adnexa: no masses; non tender   Performed with chaperone Garen Grams, LPN     Assessment & Plan:

## 2013-08-24 NOTE — Patient Instructions (Addendum)
Dear Ms. Macioce,  It was nice to meet you. I will send the swabs to the lab and let you know the results if any possible infection. I will let you know the results of your blood labs and sugar. Follow up with Dr. Piedad Climes about your birth control.    Dr. Clinton Sawyer

## 2013-08-25 ENCOUNTER — Telehealth: Payer: Self-pay | Admitting: Family Medicine

## 2013-08-25 LAB — HIV ANTIBODY (ROUTINE TESTING W REFLEX): HIV: NONREACTIVE

## 2013-08-25 NOTE — Assessment & Plan Note (Signed)
Assessment: increased risk of DM type 2 give Body mass index is 49.47 kg/(m^2). and fam hx of 1st degree relatives Plan: check A1C

## 2013-08-25 NOTE — Telephone Encounter (Signed)
Please let the patient know that all the tests that we performed for infections were negative. The lab did not test the blood sugar, because the order was not processed in time. So she can come back at her convenience for a lab visit or she can do that at her next visit with Dr. Piedad Climes.

## 2013-08-25 NOTE — Assessment & Plan Note (Signed)
Assessment: Unprotected sex with little known partner and concern for STD exposure, with high risk behavior Plan: counseled on risk of behavior and plan for checking following wet prep, GC/CT, HIV, RPR; patient wishes to be called with results

## 2013-08-26 NOTE — Telephone Encounter (Signed)
Patient informed of message form MD, expressed understanding. 

## 2013-09-15 ENCOUNTER — Encounter: Payer: Self-pay | Admitting: Emergency Medicine

## 2013-09-15 ENCOUNTER — Ambulatory Visit (INDEPENDENT_AMBULATORY_CARE_PROVIDER_SITE_OTHER): Payer: Medicaid Other | Admitting: Emergency Medicine

## 2013-09-15 VITALS — BP 111/76 | HR 84 | Ht 62.5 in | Wt 274.0 lb

## 2013-09-15 DIAGNOSIS — Z309 Encounter for contraceptive management, unspecified: Secondary | ICD-10-CM

## 2013-09-15 DIAGNOSIS — IMO0001 Reserved for inherently not codable concepts without codable children: Secondary | ICD-10-CM

## 2013-09-15 LAB — POCT URINE PREGNANCY: Preg Test, Ur: NEGATIVE

## 2013-09-15 MED ORDER — MEDROXYPROGESTERONE ACETATE 150 MG/ML IM SUSP: 150.0000 mg | Freq: Once | INTRAMUSCULAR | Status: DC

## 2013-09-15 MED ORDER — MEDROXYPROGESTERONE ACETATE 150 MG/ML IM SUSP
150.0000 mg | Freq: Once | INTRAMUSCULAR | Status: AC
  Administered 2013-09-15: 150 mg via INTRAMUSCULAR

## 2013-09-15 NOTE — Assessment & Plan Note (Signed)
Will start Depo-Provera today. Discussed side effects, specifically weight gain and irregular bleeding. Must use condoms for next 2 weeks. Follow up in 3 months to decide if we are continuing with depo or going to switch to implant vs IUD. Hand outs on depo, IUD, and implant provided. Will get pap smear at follow up appointment.

## 2013-09-15 NOTE — Patient Instructions (Signed)
It was nice to see you!  We gave you for first Depo shot today. You MUST USE CONDOMS until 09/29/2013.  Follow up with me in 3 months to decide if we are going to continue with depo or do the Nexplanon or IUD instead.  We will also do a pap smear at that appointment.  Medroxyprogesterone injection [Contraceptive] What is this medicine? MEDROXYPROGESTERONE (me DROX ee proe JES te rone) contraceptive injections prevent pregnancy. They provide effective birth control for 3 months. Depo-subQ Provera 104 is also used for treating pain related to endometriosis. This medicine may be used for other purposes; ask your health care provider or pharmacist if you have questions. How should I use this medicine? Depo-Provera Contraceptive injection is given into a muscle. Depo-subQ Provera 104 injection is given under the skin. These injections are given by a health care professional. You must not be pregnant before getting an injection. The injection is usually given during the first 5 days after the start of a menstrual period or 6 weeks after delivery of a baby. Talk to your pediatrician regarding the use of this medicine in children. Special care may be needed. These injections have been used in female children who have started having menstrual periods. Overdosage: If you think you have taken too much of this medicine contact a poison control center or emergency room at once. NOTE: This medicine is only for you. Do not share this medicine with others. What if I miss a dose? Try not to miss a dose. You must get an injection once every 3 months to maintain birth control. If you cannot keep an appointment, call and reschedule it. If you wait longer than 13 weeks between Depo-Provera contraceptive injections or longer than 14 weeks between Depo-subQ Provera 104 injections, you could get pregnant. Use another method for birth control if you miss your appointment. You may also need a pregnancy test before receiving  another injection. What should I watch for while using this medicine? This drug does not protect you against HIV infection (AIDS) or other sexually transmitted diseases. Use of this product may cause you to lose calcium from your bones. Loss of calcium may cause weak bones (osteoporosis). Only use this product for more than 2 years if other forms of birth control are not right for you. The longer you use this product for birth control the more likely you will be at risk for weak bones. Ask your health care professional how you can keep strong bones. You may have a change in bleeding pattern or irregular periods. Many females stop having periods while taking this drug. If you have received your injections on time, your chance of being pregnant is very low. If you think you may be pregnant, see your health care professional as soon as possible. Tell your health care professional if you want to get pregnant within the next year. The effect of this medicine may last a long time after you get your last injection. What side effects may I notice from receiving this medicine? Side effects that you should report to your doctor or health care professional as soon as possible: -allergic reactions like skin rash, itching or hives, swelling of the face, lips, or tongue -breast tenderness or discharge -breathing problems -changes in vision -depression -feeling faint or lightheaded, falls -fever -pain in the abdomen, chest, groin, or leg -problems with balance, talking, walking -unusually weak or tired -yellowing of the eyes or skin Side effects that usually do not require medical  attention (report to your doctor or health care professional if they continue or are bothersome): -acne -fluid retention and swelling -headache -irregular periods, spotting, or absent periods -temporary pain, itching, or skin reaction at site where injected -weight gain This list may not describe all possible side effects. Call  your doctor for medical advice about side effects. You may report side effects to FDA at 1-800-FDA-1088.  2013, Elsevier/Gold Standard. (12/15/2008 6:37:56 PM)  Etonogestrel implant What is this medicine? ETONOGESTREL is a contraceptive (birth control) device. It is used to prevent pregnancy. It can be used for up to 3 years. This medicine may be used for other purposes; ask your health care provider or pharmacist if you have questions. How should I use this medicine? This device is inserted just under the skin on the inner side of your upper arm by a health care professional. Talk to your pediatrician regarding the use of this medicine in children. Special care may be needed. Overdosage: If you think you've taken too much of this medicine contact a poison control center or emergency room at once. Overdosage: If you think you have taken too much of this medicine contact a poison control center or emergency room at once. NOTE: This medicine is only for you. Do not share this medicine with others. What if I miss a dose? This does not apply. What should I watch for while using this medicine? This product does not protect you against HIV infection (AIDS) or other sexually transmitted diseases. You should be able to feel the implant by pressing your fingertips over the skin where it was inserted. Tell your doctor if you cannot feel the implant.  2013, Elsevier/Gold Standard. (08/17/2009 3:54:17 PM)  Levonorgestrel intrauterine device (IUD) What is this medicine? LEVONORGESTREL IUD (LEE voe nor jes trel) is a contraceptive (birth control) device. It is used to prevent pregnancy and to treat heavy bleeding that occurs during your period. It can be used for up to 5 years. This medicine may be used for other purposes; ask your health care provider or pharmacist if you have questions. How should I use this medicine? This device is placed inside the uterus by a health care professional. What should I  watch for while using this medicine? Visit your doctor or health care professional for regular check ups. See your doctor if you or your partner has sexual contact with others, becomes HIV positive, or gets a sexual transmitted disease. This product does not protect you against HIV infection (AIDS) or other sexually transmitted diseases. You can check the placement of the IUD yourself by reaching up to the top of your vagina with clean fingers to feel the threads. Do not pull on the threads. It is a good habit to check placement after each menstrual period. Call your doctor right away if you feel more of the IUD than just the threads or if you cannot feel the threads at all. The IUD may come out by itself. You may become pregnant if the device comes out. If you notice that the IUD has come out use a backup birth control method like condoms and call your health care provider. Using tampons will not change the position of the IUD and are okay to use during your period. What side effects may I notice from receiving this medicine?  2012, Elsevier/Gold Standard. (12/15/2008 6:39:08 PM)

## 2013-09-15 NOTE — Progress Notes (Signed)
  Subjective:    Patient ID: Veronica [name], female    DOB: [birth year], 22 y.o.   MRN: [id number]  HPI Veronica [name] is here for birth control.  She states that she would like to start birth control.  Wants to go with Depo for now, but is thinking about the implant vs IUD as well.  She has not been sexually active for the last 3 years since her son was born.  She recently started seeing someone and would like to start birth control.  Does not want to have kids for a probably another 5 years.  She does have some headaches, but they are mild and respond to tylenol.  No personal or family history of blood clots.  No personal history of hypertension.  She has a period once a month; does get cramps and has some heavy bleeding sometimes.  I have reviewed and updated the following as appropriate: allergies and current medications SHx: non smoker  Review of Systems See HPI    Objective:   Physical Exam BP 111/76  Pulse 84  Ht 5' 2.5" (1.588 m)  Wt 274 lb (124.286 kg)  BMI 49.29 kg/m2  LMP 08/04/2013 Gen: alert, cooperative, NAD HEENT: AT/[place], sclera white, MMM Neck: supple CV: RRR, no murmurs Pulm: CTAB, no wheezes or rales     Assessment & Plan:

## 2013-09-15 NOTE — Addendum Note (Signed)
Addended byArlyss Repress on: 09/15/2013 05:02 PM   Modules accepted: Orders

## 2013-10-14 ENCOUNTER — Other Ambulatory Visit: Payer: Self-pay | Admitting: Internal Medicine

## 2013-11-07 ENCOUNTER — Telehealth: Payer: Self-pay | Admitting: Internal Medicine

## 2013-11-07 NOTE — Telephone Encounter (Signed)
lvm for pt on cell to tell her I am working in a Prior authorization for Nexium. To please call me back if she is in need of samples to tide her over.

## 2013-12-15 ENCOUNTER — Encounter: Payer: Self-pay | Admitting: Family Medicine

## 2013-12-15 ENCOUNTER — Ambulatory Visit (INDEPENDENT_AMBULATORY_CARE_PROVIDER_SITE_OTHER): Payer: Medicaid Other | Admitting: Family Medicine

## 2013-12-15 ENCOUNTER — Other Ambulatory Visit (HOSPITAL_COMMUNITY)
Admission: RE | Admit: 2013-12-15 | Discharge: 2013-12-15 | Disposition: A | Discharge status: Home / Self Care | Payer: Medicaid Other | Source: Ambulatory Visit | Attending: Family Medicine | Admitting: Family Medicine

## 2013-12-15 VITALS — BP 100/68 | HR 74 | Temp 98.2°F | Wt 276.0 lb

## 2013-12-15 DIAGNOSIS — Z309 Encounter for contraceptive management, unspecified: Secondary | ICD-10-CM

## 2013-12-15 DIAGNOSIS — R300 Dysuria: Secondary | ICD-10-CM

## 2013-12-15 DIAGNOSIS — Z113 Encounter for screening for infections with a predominantly sexual mode of transmission: Secondary | ICD-10-CM | POA: Insufficient documentation

## 2013-12-15 LAB — POCT UA - MICROSCOPIC ONLY
RBC, urine, microscopic: >20
WBC, Ur, HPF, POC: >20

## 2013-12-15 LAB — POCT URINALYSIS DIPSTICK
Bilirubin, UA: NEGATIVE
Glucose, UA: NEGATIVE
KETONES UA: NEGATIVE
Nitrite, UA: NEGATIVE
PH UA: 7.5
PROTEIN UA: 100
SPEC GRAV UA: 1.025
UROBILINOGEN UA: 1

## 2013-12-15 LAB — POCT WET PREP (WET MOUNT): CLUE CELLS WET PREP WHIFF POC: NEGATIVE

## 2013-12-15 MED ORDER — MEDROXYPROGESTERONE ACETATE 150 MG/ML IM SUSP
150.0000 mg | Freq: Once | INTRAMUSCULAR | Status: AC
  Administered 2013-12-15: 150 mg via INTRAMUSCULAR

## 2013-12-15 MED ORDER — SULFAMETHOXAZOLE-TMP DS 800-160 MG PO TABS
1.0000 | ORAL_TABLET | Freq: Two times a day (BID) | ORAL | Status: DC
Start: 2013-12-15 — End: 2014-01-19

## 2013-12-15 NOTE — Progress Notes (Signed)
Patient ID: Veronica [name]    DOB: Jan 17, 1991, 22 y.o.   MRN: [id number] --- Subjective:  Veronica [name] is a 23 y.o.female who presents for evaluation of dysuria. She reports sensation of burning with urination associated with pressure in the suprapubic region for about a week. She has also noticed blood in her urine. She has had increased frequency. She denies any low back pain, nausea, vomiting. She also reports some itching in the inside of her vagina. She denies any discharge. No odor. She is sexually active. She is on depo for birth control.   ROS: see HPI Past Medical History: reviewed and updated medications and allergies. Social History: Tobacco: None  Objective: Filed Vitals:   12/15/13 1207  BP: 100/68  Pulse: 74  Temp: 98.2 F (36.8 C)    Physical Examination:   General appearance - alert, well appearing, and in no distress Chest - clear to auscultation, no wheezes, rales or rhonchi, symmetric air entry Heart - normal rate, regular rhythm, normal S1, S2, no murmurs, rubs, clicks or gallops Abdomen - soft, nontender, non-distended Pelvic exam: normal external genitalia, vulva, vagina, cervix, uterus and adnexa.

## 2013-12-15 NOTE — Assessment & Plan Note (Addendum)
UA with glide and leuks. Placenta culture. Given symptoms, will empirically treat with bactrim for 3 days.  Also checked for yeast, BV, trichomoniasis, GC chlamydia. Red flags for return reviewed.

## 2013-12-15 NOTE — Patient Instructions (Signed)
I think he had a urinary tract infection for which a low-grade treat you with an antibiotic called Bactrim. If you've get worse, if you do not get better, develop fevers or chills or back pain please return to the clinic. We will call you with any other results are abnormal. We will send you a letter with the results of the urine culture.   Urinary Tract Infection Urinary tract infections (UTIs) can develop anywhere along your urinary tract. Your urinary tract is your body's drainage system for removing wastes and extra water. Your urinary tract includes two kidneys, two ureters, a bladder, and a urethra. Your kidneys are a pair of bean-shaped organs. Each kidney is about the size of your fist. They are located below your ribs, one on each side of your spine. CAUSES Infections are caused by microbes, which are microscopic organisms, including fungi, viruses, and bacteria. These organisms are so small that they can only be seen through a microscope. Bacteria are the microbes that most commonly cause UTIs. SYMPTOMS  Symptoms of UTIs may vary by age and gender of the patient and by the location of the infection. Symptoms in young women typically include a frequent and intense urge to urinate and a painful, burning feeling in the bladder or urethra during urination. Older women and men are more likely to be tired, shaky, and weak and have muscle aches and abdominal pain. A fever may mean the infection is in your kidneys. Other symptoms of a kidney infection include pain in your back or sides below the ribs, nausea, and vomiting. DIAGNOSIS To diagnose a UTI, your caregiver will ask you about your symptoms. Your caregiver also will ask to provide a urine sample. The urine sample will be tested for bacteria and white blood cells. White blood cells are made by your body to help fight infection. TREATMENT  Typically, UTIs can be treated with medication. Because most UTIs are caused by a bacterial infection, they  usually can be treated with the use of antibiotics. The choice of antibiotic and length of treatment depend on your symptoms and the type of bacteria causing your infection. HOME CARE INSTRUCTIONS  If you were prescribed antibiotics, take them exactly as your caregiver instructs you. Finish the medication even if you feel better after you have only taken some of the medication.  Drink enough water and fluids to keep your urine clear or pale yellow.  Avoid caffeine, tea, and carbonated beverages. They tend to irritate your bladder.  Empty your bladder often. Avoid holding urine for long periods of time.  Empty your bladder before and after sexual intercourse.  After a bowel movement, women should cleanse from front to back. Use each tissue only once. SEEK MEDICAL CARE IF:   You have back pain.  You develop a fever.  Your symptoms do not begin to resolve within 3 days. SEEK IMMEDIATE MEDICAL CARE IF:   You have severe back pain or lower abdominal pain.  You develop chills.  You have nausea or vomiting.  You have continued burning or discomfort with urination. MAKE SURE YOU:   Understand these instructions.  Will watch your condition.  Will get help right away if you are not doing well or get worse. Document Released: 09/03/2005 Document Revised: 05/25/2012 Document Reviewed: 01/02/2012 [facility] Patient Information 2014 [place], [place].

## 2013-12-16 LAB — URINE CULTURE
Colony Count: NO GROWTH
Organism ID, Bacteria: NO GROWTH

## 2013-12-19 ENCOUNTER — Other Ambulatory Visit: Payer: Self-pay | Admitting: Internal Medicine

## 2013-12-19 ENCOUNTER — Telehealth: Payer: Self-pay | Admitting: Family Medicine

## 2013-12-19 NOTE — Telephone Encounter (Signed)
Please let patient know that all her studies came back normal: urine culture, wet prep, GC/Chl. Thank you!  Marena ChancyStephanie Marquize Seib, PGY-3 Family Medicine Resident

## 2013-12-19 NOTE — Telephone Encounter (Signed)
Pt notified.  Tinsleigh Slovacek L, CMA  

## 2014-01-19 ENCOUNTER — Ambulatory Visit (INDEPENDENT_AMBULATORY_CARE_PROVIDER_SITE_OTHER): Payer: Medicaid Other | Admitting: Family Medicine

## 2014-01-19 ENCOUNTER — Encounter: Payer: Self-pay | Admitting: Family Medicine

## 2014-01-19 VITALS — BP 122/85 | HR 78 | Temp 99.0°F | Ht 62.5 in | Wt 268.0 lb

## 2014-01-19 DIAGNOSIS — B9689 Other specified bacterial agents as the cause of diseases classified elsewhere: Secondary | ICD-10-CM

## 2014-01-19 DIAGNOSIS — A499 Bacterial infection, unspecified: Secondary | ICD-10-CM

## 2014-01-19 DIAGNOSIS — N760 Acute vaginitis: Secondary | ICD-10-CM

## 2014-01-19 MED ORDER — METRONIDAZOLE 500 MG PO TABS: 500.0000 mg | ORAL_TABLET | Freq: Three times a day (TID) | ORAL | Status: DC

## 2014-01-19 NOTE — Assessment & Plan Note (Signed)
Symptoms suggestive of vaginitis, but no obvious yeast-like discharge. No fever, suprapubic tenderness, malodorous urine, or true urinary symptoms to suggest cystitis or other UTI / pyelonephritis.  No brisk vaginal bleeding and history consistent with unpredictable / expected irregular spotting during Depo-Provera treatment. GU exam otherwise normal. Overall most consistent with BV; favor empiric treatment, without wet prep collected today. Rx for metronidazole for 7 days. Follow up with PCP in 1-2 weeks or sooner if symptoms worsening or not improving.

## 2014-01-19 NOTE — Progress Notes (Signed)
   Subjective:    Patient ID: Veronica [name], female    DOB: 08/27/1991, 23 y.o.   MRN: [id number]  HPI: Pt presents to clinic for SDA for vaginal itching for about 4 days. Pt denies real burning with urination, but her itching is worse with urination. She does have increased urge and dribbling, as well as increased frequency. She has had UTI's in the past with similar symptoms, but this is different. She has had yeast infections in the past, as well, but her current symptoms are different from either her previous UTI's or her yeast infections. She does not have any vaginal discharge. She has not taken any new or different medications. She denies rashes, fevers / chills, side / flank or abdominal pain. She does not have malodorous urine.  Pt is sexually active; her last intercourse about 5 days ago. She has one stable, monogamous female partner.   Review of Systems: As above. She does endorse some irregular vaginal bleeding which has not been different since starting Depo-Provera for birth control.     Objective:   Physical Exam BP 122/85  Pulse 78  Temp(Src) 99 F (37.2 C) (Oral)  Ht 5' 2.5" (1.588 m)  Wt 268 lb (121.564 kg)  BMI 48.21 kg/m2 Gen: well-appearing adult female in NAD Cardio: RRR, no murmur Pulm: CTAB, no wheeze Abd: soft, nontender, BS+, no flank or suprapubic tenderness MSK: no lumbar spinal, paraspinal, or CVA tenderness Ext: warm, well-perfused, no LE edema, no rashes GU: external vaginal / vulvar structures normal, no brisk vaginal bleeding or gross discharge noted  Speculum exam: moderate amount clear, grayish discharge in vaginal vault, cervix normal with small amount of oozing blood  Bimanual exam: no CMT, no adnexal masses, no fundal or adnexal tenderness     Assessment & Plan:

## 2014-01-19 NOTE — Patient Instructions (Signed)
Thank you for coming in, today!  Your exam is consistent with "BV" (bacterial vaginosis) -- this is NOT a sexually-transmitted infection. It's an overgrowth of the normal bacteria in the vagina. You can read more about it below. You should take metronidazole (Flagyl) 500 mg, three times a day, for 1 week. DO NOT drink alcohol while taking this medication. Follow up with Dr. Piedad ClimesHonig in 1-2 weeks, or sooner if your symptoms aren't any better.  Please feel free to call with any questions or concerns at any time, at [phone]. --Dr. Casper HarrisonStreet  Bacterial Vaginosis Bacterial vaginosis is an infection of the vagina. It happens when too many of certain germs (bacteria) grow in the vagina. HOME CARE  Take your medicine as told by your doctor.  Finish your medicine even if you start to feel better.  Do not have sex until you finish your medicine and are better.  Tell your sex partner that you have an infection. They should see their doctor for treatment.  Practice safe sex. Use condoms. Have only one sex partner. GET HELP IF:  You are not getting better after 3 days of treatment.  You have more grey fluid (discharge) coming from your vagina than before.  You have more pain than before.  You have a fever. MAKE SURE YOU:   Understand these instructions.  Will watch your condition.  Will get help right away if you are not doing well or get worse. Document Released: 09/02/2008 Document Revised: 09/14/2013 Document Reviewed: 07/06/2013 [facility]ExitCare Patient Information 2014 [place]ExitCare, [place]LLC.

## 2014-03-17 ENCOUNTER — Other Ambulatory Visit: Payer: Self-pay | Admitting: Internal Medicine

## 2014-03-23 ENCOUNTER — Ambulatory Visit: Payer: Medicaid Other | Admitting: Family Medicine

## 2014-03-24 ENCOUNTER — Emergency Department (INDEPENDENT_AMBULATORY_CARE_PROVIDER_SITE_OTHER)
Admission: EM | Admit: 2014-03-24 | Discharge: 2014-03-24 | Disposition: A | Discharge status: Home / Self Care | Payer: Self-pay | Source: Home / Self Care

## 2014-03-24 ENCOUNTER — Encounter (HOSPITAL_COMMUNITY): Payer: Self-pay | Admitting: Emergency Medicine

## 2014-03-24 DIAGNOSIS — H6120 Impacted cerumen, unspecified ear: Secondary | ICD-10-CM

## 2014-03-24 DIAGNOSIS — H9209 Otalgia, unspecified ear: Secondary | ICD-10-CM

## 2014-03-24 DIAGNOSIS — Z9109 Other allergy status, other than to drugs and biological substances: Secondary | ICD-10-CM

## 2014-03-24 DIAGNOSIS — R519 Headache, unspecified: Secondary | ICD-10-CM

## 2014-03-24 DIAGNOSIS — R51 Headache: Secondary | ICD-10-CM

## 2014-03-24 MED ORDER — KETOROLAC TROMETHAMINE 60 MG/2ML IM SOLN
60.0000 mg | Freq: Once | INTRAMUSCULAR | Status: AC
  Administered 2014-03-24: 60 mg via INTRAMUSCULAR

## 2014-03-24 MED ORDER — ANTIPYRINE-BENZOCAINE 5.4-1.4 % OT SOLN: 3.0000 [drp] | OTIC | Status: DC | PRN

## 2014-03-24 MED ORDER — ANTIPYRINE-BENZOCAINE 5.4-1.4 % OT SOLN
3.0000 [drp] | Freq: Once | OTIC | Status: AC
  Administered 2014-03-24: 4 [drp] via OTIC

## 2014-03-24 MED ORDER — FLUTICASONE PROPIONATE 50 MCG/ACT NA SUSP: 1.0000 | Freq: Every day | NASAL | Status: DC

## 2014-03-24 MED ORDER — KETOROLAC TROMETHAMINE 60 MG/2ML IM SOLN
INTRAMUSCULAR | Status: AC
  Filled 2014-03-24: qty 2

## 2014-03-24 MED ORDER — CIPROFLOXACIN-DEXAMETHASONE 0.3-0.1 % OT SUSP: 4.0000 [drp] | Freq: Two times a day (BID) | OTIC | Status: DC

## 2014-03-24 NOTE — Discharge Instructions (Signed)
It's difficult to tell exactly where your headache is coming from. It may be related to the pollen in the air and sinus congestion, a migraine, caffeine overuse and withdrawal.  Your ear pain will improve now tha tthey are clean. Use the ear drops as needed for pain.

## 2014-03-24 NOTE — ED Notes (Signed)
Discussed ear pain with Dr. Konrad DoloresMerrell.  He said her eardrums are intact and a little injected post earwax removal.  He said to put Auralgan ear drops in now and give her the bottle to go.

## 2014-03-24 NOTE — ED Provider Notes (Signed)
CSN: [id number]     Arrival date & time 03/24/14  1952 History   None    Chief Complaint  Patient presents with  . Headache   (Consider location/radiation/quality/duration/timing/severity/associated sxs/prior Treatment) HPI   HA: started today a couple hours before work. Frontal. Throbbing. Denies fevers, n/v/d, loss fo bowel or bladder, syncope. No photophobia or phonophobia. Drinks 2 cans of pepsi daily but nothing today. Associated w/ runny nose intermittenly. No sinus pressure. No seasonal allergies. Gets HA on occasion that are triggered typically by diet.   Ear ache: started 4 days ago. Getting worse. Achy. No change in hearing. Denies drainage. Does not clean ears. No recent swimming.   Past Medical History  Diagnosis Date  . GERD (gastroesophageal reflux disease)   . Obesity (BMI 30-39.9)   . Hiatal hernia   . Anemia   . Ankle pain    Past Surgical History  Procedure Laterality Date  . Cesarean section  09/02/2010    For macrosomia, but son was 7 lb 12 oz  . Tonsillectomy and adenoidectomy     Family History  Problem Relation Age of Onset  . Diabetes Mother   . Hypertension Mother   . Obesity Mother   . Obesity Sister   . Osteochondroma Sister   . Diabetes Sister   . Hypertension Sister    History  Substance Use Topics  . Smoking status: Never Smoker   . Smokeless tobacco: Never Used  . Alcohol Use: No   OB History   Grav Para Term Preterm Abortions TAB SAB Ect Mult Living                 Review of Systems  Constitutional: Negative for fever, activity change and fatigue.  HENT: Positive for rhinorrhea.   Neurological: Positive for headaches. Negative for dizziness, tremors, seizures, syncope, facial asymmetry, speech difficulty, weakness, light-headedness and numbness.  All other systems reviewed and are negative.   Allergies  Morphine and related  Home Medications   Prior to Admission medications   Medication Sig Start Date End Date Taking?  Authorizing Provider  Fe Fum-FePoly-FA-Vit C-Vit B3 (INTEGRA F) 125-1 MG CAPS TAKE 1 CAPSULE BY MOUTH DAILY. 12/19/13  Yes Beverley FiedlerJay M Pyrtle, MD  NEXIUM 40 MG capsule TAKE 1 CAPSULE (40 MG TOTAL) BY MOUTH DAILY.   Yes Beverley FiedlerJay M Pyrtle, MD  medroxyPROGESTERone (DEPO-PROVERA) 150 MG/ML injection Inject 1 mL (150 mg total) into the muscle once. 09/15/13   Charm RingsErin J Honig, MD  metroNIDAZOLE (FLAGYL) 500 MG tablet Take 1 tablet (500 mg total) by mouth 3 (three) times daily. 01/19/14   Stephanie Couphristopher M Street, MD   BP 126/77  Pulse 76  Temp(Src) 100.1 F (37.8 C) (Oral)  Resp 20  SpO2 100% Physical Exam  Constitutional: She is oriented to person, place, and time. She appears well-developed and well-nourished. No distress.  HENT:  Head: Normocephalic and atraumatic.  Bilat cerumen impaction blocking TM. Cleaned and then viewed again and TM intact w/ mild external canal injection (cleaning vs previously injected).  Eyes: EOM are normal. Pupils are equal, round, and reactive to light.  Neck: Normal range of motion.  Pulmonary/Chest: Effort normal. No respiratory distress.  Abdominal: Soft. She exhibits no distension.  Musculoskeletal: Normal range of motion. She exhibits no edema.  Neurological: She is alert and oriented to person, place, and time. No cranial nerve deficit. She exhibits normal muscle tone. Coordination normal.  Skin: Skin is warm and dry. No rash noted. No erythema. No pallor.  Psychiatric: She has a normal mood and affect. Her behavior is normal. Judgment and thought content normal.    ED Course  Procedures (including critical care time) Labs Review Labs Reviewed - No data to display  Results for orders placed in visit on 12/15/13  URINE CULTURE      Result Value Ref Range   Colony Count NO GROWTH     Organism ID, Bacteria NO GROWTH    POCT UA - MICROSCOPIC ONLY      Result Value Ref Range   WBC, Ur, HPF, POC >20     RBC, urine, microscopic >20     Bacteria, U Microscopic 2+      Mucus, UA TRACE     Epithelial cells, urine per micros 10-20     Crystals, Ur, HPF, POC NONE     Casts, Ur, LPF, POC ONE GRANULAR CAST SEEN    POCT URINALYSIS DIPSTICK      Result Value Ref Range   Color, UA YELLOW     Clarity, UA SLIGHTLY CLOUDY     Glucose, UA NEGATIVE     Bilirubin, UA NEGATIVE     Ketones, UA NEGATIVE     Spec Grav, UA 1.025     Blood, UA LARGE     pH, UA 7.5     Protein, UA 100     Urobilinogen, UA 1.0     Nitrite, UA NEGATIVE     Leukocytes, UA Trace    POCT WET PREP (WET MOUNT)      Result Value Ref Range   Source Wet Prep POC VAG     WBC, Wet Prep HPF POC 1-5     Bacteria Wet Prep HPF POC 1+ RODS     Clue Cells Wet Prep HPF POC None     CLUE CELLS WET PREP WHIFF POC Negative Whiff     Yeast Wet Prep HPF POC None     Trichomonas Wet Prep HPF POC NONE     Imaging Review No results found.   MDM   1. Headache   2. Environmental allergies   3. Cerumen impaction   4. Ear pain    23yo F w/ HA of unclear etiology (migraine, vs caffeine overuse vs allergies vs sinusitis). Ears cleaned w/ mild otitis externa from impaction w/ possible infection - ciprodex if pain not relieved in 1-2 days - start flonase - Toradol 60 in office (f/u NSAIDs in 24 hours,) - tylenol 1gm Q8. - auralgan if needed - precautions given and all questions answered  Shelly Flattenavid Reis Goga, MD Family Medicine PGY-3 03/24/2014, 9:20 PM      Ozella Rocksavid J Shanvi Moyd, MD 03/24/14 2120  Ozella Rocksavid J Lakyn Alsteen, MD 03/24/14 2124  Ozella Rocksavid J Sunshine Mackowski, MD 03/24/14 2130

## 2014-03-24 NOTE — ED Notes (Signed)
C/o headache, R eye painful, bil. earache and pain inside her nose onset 2 days ago.  No chills or fever.  C/o runny nose and a little cough.

## 2014-03-25 NOTE — ED Provider Notes (Signed)
Medical screening examination/treatment/procedure(s) were performed by a resident physician or non-physician practitioner and as the supervising physician I was immediately available for consultation/collaboration.  Ly Bacchi, MD   Jlynn Ly S Vincent Streater, MD 03/25/14 0834 

## 2014-04-13 ENCOUNTER — Ambulatory Visit (INDEPENDENT_AMBULATORY_CARE_PROVIDER_SITE_OTHER): Payer: Medicaid Other | Admitting: *Deleted

## 2014-04-13 ENCOUNTER — Encounter: Payer: Self-pay | Admitting: Emergency Medicine

## 2014-04-13 ENCOUNTER — Ambulatory Visit (INDEPENDENT_AMBULATORY_CARE_PROVIDER_SITE_OTHER): Payer: Medicaid Other | Admitting: Emergency Medicine

## 2014-04-13 VITALS — BP 109/72 | HR 74 | Ht 62.5 in | Wt 275.0 lb

## 2014-04-13 DIAGNOSIS — IMO0001 Reserved for inherently not codable concepts without codable children: Secondary | ICD-10-CM

## 2014-04-13 DIAGNOSIS — H6120 Impacted cerumen, unspecified ear: Secondary | ICD-10-CM

## 2014-04-13 DIAGNOSIS — Z309 Encounter for contraceptive management, unspecified: Secondary | ICD-10-CM

## 2014-04-13 NOTE — Assessment & Plan Note (Signed)
S/[facility] mid [facility]. Scab present in left ear canal. Apply 1 drop on baby or mineral oil 3 times a day to soften scab.

## 2014-04-13 NOTE — Patient Instructions (Signed)
It was nice to see you!  You have a scab in your left ear from when they got the wax out.  This is what's causing the itching and discomfort. Get some mineral oil or baby oil and put a drop in that ear 3 times a day to soft the scab.  Make an appointment to see me next month to insert the Nexplanon.   Etonogestrel implant What is this medicine? ETONOGESTREL (et oh noe JES trel) is a contraceptive (birth control) device. It is used to prevent pregnancy. It can be used for up to 3 years. This medicine may be used for other purposes; ask your health care provider or pharmacist if you have questions. COMMON BRAND NAME(S): Implanon, Nexplanon  What should I tell my health care provider before I take this medicine? They need to know if you have any of these conditions: -abnormal vaginal bleeding -blood vessel disease or blood clots -cancer of the breast, cervix, or liver -depression -diabetes -gallbladder disease -headaches -heart disease or recent heart attack -high blood pressure -high cholesterol -kidney disease -liver disease -renal disease -seizures -tobacco smoker -an unusual or allergic reaction to etonogestrel, other hormones, anesthetics or antiseptics, medicines, foods, dyes, or preservatives -pregnant or trying to get pregnant -breast-feeding How should I use this medicine? This device is inserted just under the skin on the inner side of your upper arm by a health care professional. Talk to your pediatrician regarding the use of this medicine in children. Special care may be needed. Overdosage: If you think you've taken too much of this medicine contact a poison control center or emergency room at once. Overdosage: If you think you have taken too much of this medicine contact a poison control center or emergency room at once. NOTE: This medicine is only for you. Do not share this medicine with others. What if I miss a dose? This does not apply. What may interact with this  medicine? Do not take this medicine with any of the following medications: -amprenavir -bosentan -fosamprenavir This medicine may also interact with the following medications: -barbiturate medicines for inducing sleep or treating seizures -certain medicines for fungal infections like ketoconazole and itraconazole -griseofulvin -medicines to treat seizures like carbamazepine, felbamate, oxcarbazepine, phenytoin, topiramate -modafinil -phenylbutazone -rifampin -some medicines to treat HIV infection like atazanavir, indinavir, lopinavir, nelfinavir, tipranavir, ritonavir -St. John's wort This list may not describe all possible interactions. Give your health care provider a list of all the medicines, herbs, non-prescription drugs, or dietary supplements you use. Also tell them if you smoke, drink alcohol, or use illegal drugs. Some items may interact with your medicine. What should I watch for while using this medicine? This product does not protect you against HIV infection (AIDS) or other sexually transmitted diseases. You should be able to feel the implant by pressing your fingertips over the skin where it was inserted. Tell your doctor if you cannot feel the implant. What side effects may I notice from receiving this medicine? Side effects that you should report to your doctor or health care professional as soon as possible: -allergic reactions like skin rash, itching or hives, swelling of the face, lips, or tongue -breast lumps -changes in vision -confusion, trouble speaking or understanding -dark urine -depressed mood -general ill feeling or flu-like symptoms -light-colored stools -loss of appetite, nausea -right upper belly pain -severe headaches -severe pain, swelling, or tenderness in the abdomen -shortness of breath, chest pain, swelling in a leg -signs of pregnancy -sudden numbness or weakness of  the face, arm or leg -trouble walking, dizziness, loss of balance or  coordination -unusual vaginal bleeding, discharge -unusually weak or tired -yellowing of the eyes or skin Side effects that usually do not require medical attention (Report these to your doctor or health care professional if they continue or are bothersome.): -acne -breast pain -changes in weight -cough -fever or chills -headache -irregular menstrual bleeding -itching, burning, and vaginal discharge -pain or difficulty passing urine -sore throat This list may not describe all possible side effects. Call your doctor for medical advice about side effects. You may report side effects to FDA at 1-800-FDA-1088. Where should I keep my medicine? This drug is given in a hospital or clinic and will not be stored at home. NOTE: This sheet is a summary. It may not cover all possible information. If you have questions about this medicine, talk to your doctor, pharmacist, or health care provider.  2014, Elsevier/Gold Standard. (2012-05-31 15:37:45)

## 2014-04-13 NOTE — Progress Notes (Signed)
   Subjective:    Patient ID: Veronica [name], female    DOB: 01-26-1991, 23 y.o.   MRN: [id number]  HPI Veronica LaughterKimberly R Frese is here for followup cerumen removal and birth control.  She was seen at urgent care in mid April for ear discomfort and was noted to have cerumen impaction bilaterally. She underwent cerumen removal that day and had some bloody discharge from the left ear. She states she's had intermittent itching and discomfort in the left ear, but no further discharge bloody or otherwise. She's not using anything in the ear currently. Denies any changes in hearing.  She also states she would like to change her birth control. She's been using the depth of shot for the last 2 years. Given her schedule it is difficult for her to come in every 3 months, and she would like to change to a longer acting contraception.  Current Outpatient Prescriptions on File Prior to Visit  Medication Sig Dispense Refill  . antipyrine-benzocaine (AURALGAN) otic solution Place 3-4 drops into both ears every 2 (two) hours as needed for ear pain.  10 mL  0  . ciprofloxacin-dexamethasone (CIPRODEX) otic suspension Place 4 drops into both ears 2 (two) times daily.  7.5 mL  0  . Fe Fum-FePoly-FA-Vit C-Vit B3 (INTEGRA F) 125-1 MG CAPS TAKE 1 CAPSULE BY MOUTH DAILY.  30 capsule  10  . fluticasone (FLONASE) 50 MCG/ACT nasal spray Place 1 spray into both nostrils daily.  16 g  2  . medroxyPROGESTERone (DEPO-PROVERA) 150 MG/ML injection Inject 1 mL (150 mg total) into the muscle once.  1 mL  3  . metroNIDAZOLE (FLAGYL) 500 MG tablet Take 1 tablet (500 mg total) by mouth 3 (three) times daily.  21 tablet  0  . NEXIUM 40 MG capsule TAKE 1 CAPSULE (40 MG TOTAL) BY MOUTH DAILY.  30 capsule  6   No current facility-administered medications on file prior to visit.    I have reviewed and updated the following as appropriate: allergies and current medications SHx: non smoker   Review of Systems See HPI    Objective:   Physical Exam BP 109/72  Pulse 74  Ht 5' 2.5" (1.588 m)  Wt 275 lb (124.739 kg)  BMI 49.47 kg/m2 Gen: alert, cooperative, NAD HEENT: R ear canal and TM normal; L TM normal, ear canal has linear scab at 5 o'clock     Assessment & Plan:

## 2014-04-13 NOTE — Progress Notes (Signed)
Erroneous encounter, please disregard

## 2014-04-13 NOTE — Assessment & Plan Note (Signed)
Would like to change to Nexplanon. Patient to schedule appt for insertion. Discuss no sex or strict condom use until 2 weeks after insertion.

## 2014-04-14 ENCOUNTER — Emergency Department (HOSPITAL_COMMUNITY)
Admission: EM | Admit: 2014-04-14 | Discharge: 2014-04-15 | Disposition: A | Discharge status: Home / Self Care | Payer: Medicaid Other | Attending: Emergency Medicine | Admitting: Emergency Medicine

## 2014-04-14 ENCOUNTER — Encounter (HOSPITAL_COMMUNITY): Payer: Self-pay | Admitting: Emergency Medicine

## 2014-04-14 DIAGNOSIS — K0889 Other specified disorders of teeth and supporting structures: Secondary | ICD-10-CM

## 2014-04-14 DIAGNOSIS — K219 Gastro-esophageal reflux disease without esophagitis: Secondary | ICD-10-CM | POA: Insufficient documentation

## 2014-04-14 DIAGNOSIS — H9209 Otalgia, unspecified ear: Secondary | ICD-10-CM | POA: Insufficient documentation

## 2014-04-14 DIAGNOSIS — K089 Disorder of teeth and supporting structures, unspecified: Secondary | ICD-10-CM | POA: Insufficient documentation

## 2014-04-14 DIAGNOSIS — Z79899 Other long term (current) drug therapy: Secondary | ICD-10-CM | POA: Insufficient documentation

## 2014-04-14 DIAGNOSIS — E669 Obesity, unspecified: Secondary | ICD-10-CM | POA: Insufficient documentation

## 2014-04-14 DIAGNOSIS — Z862 Personal history of diseases of the blood and blood-forming organs and certain disorders involving the immune mechanism: Secondary | ICD-10-CM | POA: Insufficient documentation

## 2014-04-14 MED ORDER — HYDROCODONE-ACETAMINOPHEN 5-325 MG PO TABS
1.0000 | ORAL_TABLET | Freq: Once | ORAL | Status: AC
  Administered 2014-04-14: 1 via ORAL
  Filled 2014-04-14: qty 1

## 2014-04-14 MED ORDER — PENICILLIN V POTASSIUM 500 MG PO TABS: 500.0000 mg | ORAL_TABLET | Freq: Three times a day (TID) | ORAL | Status: DC

## 2014-04-14 MED ORDER — HYDROCODONE-ACETAMINOPHEN 5-325 MG PO TABS: 1.0000 | ORAL_TABLET | ORAL | Status: DC | PRN

## 2014-04-14 NOTE — ED Notes (Addendum)
Pt presents with swelling to her Right upper gums around the molars starting today. Pt denies any bleeding to gums

## 2014-04-14 NOTE — ED Notes (Signed)
Mother at bedside states "this has been going on for two weeks. [facility]

## 2014-04-14 NOTE — Discharge Instructions (Signed)
1. Medications: vicodin, penicillin, usual home medications °2. Treatment: rest, drink plenty of fluids, take medications as prescribed °3. Follow Up: Please followup with your primary doctor for discussion of your diagnoses and further evaluation after today's visit; if you do not have a primary care doctor use the resource guide provided to find one; f/u with dentistry as discussed ° ° °Dental Pain °A tooth ache may be caused by cavities (tooth decay). Cavities expose the nerve of the tooth to air and hot or cold temperatures. It may come from an infection or abscess (also called a boil or furuncle) around your tooth. It is also often caused by dental caries (tooth decay). This causes the pain you are having. °DIAGNOSIS  °Your caregiver can diagnose this problem by exam. °TREATMENT  °· If caused by an infection, it may be treated with medications which kill germs (antibiotics) and pain medications as prescribed by your caregiver. Take medications as directed. °· Only take over-the-counter or prescription medicines for pain, discomfort, or fever as directed by your caregiver. °· Whether the tooth ache today is caused by infection or dental disease, you should see your dentist as soon as possible for further care. °SEEK MEDICAL CARE IF: °The exam and treatment you received today has been provided on an emergency basis only. This is not a substitute for complete medical or dental care. If your problem worsens or new problems (symptoms) appear, and you are unable to meet with your dentist, call or return to this location. °SEEK IMMEDIATE MEDICAL CARE IF:  °· You have a fever. °· You develop redness and swelling of your face, jaw, or neck. °· You are unable to open your mouth. °· You have severe pain uncontrolled by pain medicine. °MAKE SURE YOU:  °· Understand these instructions. °· Will watch your condition. °· Will get help right away if you are not doing well or get worse. °Document Released: 11/24/2005 Document  Revised: 02/16/2012 Document Reviewed: 07/12/2008 °ExitCare® Patient Information ©2014 [employer]. ° ° °

## 2014-04-14 NOTE — ED Provider Notes (Signed)
CSN: [id number]     Arrival date & time 04/14/14  2159 History  This chart was scribed for non-physician practitioner, Dierdre ForthHannah Jerie Basford, [name], working with Dione Boozeavid Glick, MD by Charline BillsEssence Howell, ED Scribe. This patient was seen in room TR09C/TR09C and the patient's care was started at 11:16 PM.    Chief Complaint  Patient presents with  . Oral Swelling    The history is provided by the patient and medical records. No language interpreter was used.   HPI Comments: [name] is a 23 y.o. female who presents to the Emergency Department complaining of lower oral swelling onset earlier today. Pt reports associated R gumline swelling and associated R ear pain.  Pt went to [facility] last month for ear pain and was treated for a cerumen impaction.  She followed up with her PCP Dr. Piedad ClimesHonig Wednesday who reported scared tissue to the ear. Pt states that she does not clear her ears with anything at home. She denies fever, chills, nausea, vomiting. Pt has allergies to Morphine, Toradol and ibuprofen. Pt does not know the last time she saw a dentist.    Past Medical History  Diagnosis Date  . GERD (gastroesophageal reflux disease)   . Obesity (BMI 30-39.9)   . Hiatal hernia   . Anemia   . Ankle pain    Past Surgical History  Procedure Laterality Date  . Cesarean section  09/02/2010    For macrosomia, but son was 7 lb 12 oz  . Tonsillectomy and adenoidectomy     Family History  Problem Relation Age of Onset  . Diabetes Mother   . Hypertension Mother   . Obesity Mother   . Obesity Sister   . Osteochondroma Sister   . Diabetes Sister   . Hypertension Sister    History  Substance Use Topics  . Smoking status: Never Smoker   . Smokeless tobacco: Never Used  . Alcohol Use: No   OB History   Grav Para Term Preterm Abortions TAB SAB Ect Mult Living                 Review of Systems  Constitutional: Negative for fever, chills and appetite change.  HENT: Positive for dental problem and ear  pain. Negative for drooling, facial swelling, nosebleeds, postnasal drip, rhinorrhea and trouble swallowing.   Eyes: Negative for pain and redness.  Respiratory: Negative for cough and wheezing.   Cardiovascular: Negative for chest pain.  Gastrointestinal: Negative for nausea, vomiting and abdominal pain.  Musculoskeletal: Negative for neck pain and neck stiffness.  Skin: Negative for color change and rash.  Neurological: Negative for weakness, light-headedness and headaches.  All other systems reviewed and are negative.   Allergies  Morphine and related  Home Medications   Prior to Admission medications   Medication Sig Start Date End Date Taking? Authorizing Provider  esomeprazole (NEXIUM) 40 MG capsule Take 40 mg by mouth daily at 12 noon.   Yes Historical Provider, MD  Fe Fum-FePoly-FA-Vit C-Vit B3 (INTEGRA F) 125-1 MG CAPS Take 1 capsule by mouth daily.   Yes Historical Provider, MD   Triage Vitals: BP 126/85  Pulse 76  Temp(Src) 99.2 F (37.3 C) (Oral)  Resp 18  Ht 5\' 3"  (1.6 m)  Wt 275 lb (124.739 kg)  BMI 48.73 kg/m2  SpO2 98% Physical Exam  Nursing note and vitals reviewed. Constitutional: She appears well-developed and well-nourished.  HENT:  Head: Normocephalic and atraumatic.  Right Ear: Tympanic membrane, external ear and ear canal  normal.  Left Ear: Tympanic membrane, external ear and ear canal normal.  Nose: Nose normal. Right sinus exhibits no maxillary sinus tenderness and no frontal sinus tenderness. Left sinus exhibits no maxillary sinus tenderness and no frontal sinus tenderness.  Mouth/Throat: Uvula is midline, oropharynx is clear and moist and mucous membranes are normal. No oral lesions. Normal dentition. No dental abscesses, uvula swelling, lacerations or dental caries. No oropharyngeal exudate, posterior oropharyngeal edema, posterior oropharyngeal erythema or tonsillar abscesses.    R lower wisdom tooth beginning to erupt Diminished tissue  overline No erythema or abscess  NO broken teeth  NO scar tissue noted to the canal or TM of either ear.    Eyes: Conjunctivae are normal. Pupils are equal, round, and reactive to light. Right eye exhibits no discharge. Left eye exhibits no discharge.  Neck: Normal range of motion. Neck supple.  Cardiovascular: Normal rate, regular rhythm and normal heart sounds.   Pulmonary/Chest: Effort normal and breath sounds normal. No respiratory distress. She has no wheezes.  Abdominal: Soft. Bowel sounds are normal. She exhibits no distension. There is no tenderness.  Lymphadenopathy:    She has no cervical adenopathy.  Neurological: She is alert.  Skin: Skin is warm and dry.  Psychiatric: She has a normal mood and affect.    ED Course  Procedures (including critical care time) DIAGNOSTIC STUDIES: Oxygen Saturation is 98% on RA, normal by my interpretation.    COORDINATION OF CARE: 11:25 PM Discussed treatment plan with pt at bedside and pt agreed to plan.  Labs Review Labs Reviewed - No data to display  Imaging Review No results found.   EKG Interpretation None      MDM   Final diagnoses:  Pain, dental   Veronica [name] presents with dental pain beginning this morning. Patient with toothache.  No gross abscess.  Exam unconcerning for Ludwig's angina or spread of infection.  Will treat with penicillin and pain medicine.  Urged patient to follow-up with dentist.    It has been determined that no acute conditions requiring further emergency intervention are present at this time. The patient/guardian have been advised of the diagnosis and plan. We have discussed signs and symptoms that warrant return to the ED, such as changes or worsening in symptoms.   Vital signs are stable at discharge.   BP 126/85  Pulse 76  Temp(Src) 99.2 F (37.3 C) (Oral)  Resp 18  Ht 5\' 3"  (1.6 m)  Wt 275 lb (124.739 kg)  BMI 48.73 kg/m2  SpO2 98%  Patient/guardian has voiced understanding and  agreed to follow-up with the PCP or specialist.  I personally performed the services described in this documentation, which was scribed in my presence. The recorded information has been reviewed and is accurate.     Dahlia ClientHannah Jahson Emanuele, [name] 04/14/14 [phone]

## 2014-04-15 NOTE — ED Provider Notes (Signed)
Medical screening examination/treatment/procedure(s) were performed by non-physician practitioner and as supervising physician I was immediately available for consultation/collaboration.   Keion Neels, MD 04/15/14 0722 

## 2014-04-18 ENCOUNTER — Telehealth: Payer: Self-pay | Admitting: Emergency Medicine

## 2014-04-18 NOTE — Telephone Encounter (Signed)
Called and patient to clarify ear symptoms.  Her mother had called the hospital system concerned about her right ear pain and potential scar tissue followed a cerumen removal at Urgent Care last week.  I clarified, that she does NOT have scar tissue in her ears, she did have a small scab in the left ear canal.  Based on the ER physician's note from the next day, it appears the scab has already resolved.  The scab was in her left ear canal.  She is currently have pain in her right ear and some trouble with a wisdom tooth on the right side.  Discussed that the wisdom tooth may be causing the pain in her right ear.  Recommended ice packs to the right jaw and salt water gargles.    She has an appointment with me on Thursday for nexplanon placement and I will be happy to recheck her ears and answer any additional questions she has at that time.

## 2014-04-18 NOTE — Telephone Encounter (Signed)
Thanks for the update.  Is pt using auralgan for any external ear canal pain? Definitely sounds more like wisdom tooth pain vs TMJ

## 2014-04-20 ENCOUNTER — Ambulatory Visit (INDEPENDENT_AMBULATORY_CARE_PROVIDER_SITE_OTHER): Payer: Medicaid Other | Admitting: Emergency Medicine

## 2014-04-20 ENCOUNTER — Encounter: Payer: Self-pay | Admitting: Emergency Medicine

## 2014-04-20 VITALS — BP 103/66 | HR 67 | Temp 98.2°F | Resp 18 | Wt 275.0 lb

## 2014-04-20 DIAGNOSIS — Z30017 Encounter for initial prescription of implantable subdermal contraceptive: Secondary | ICD-10-CM

## 2014-04-20 DIAGNOSIS — Z3046 Encounter for surveillance of implantable subdermal contraceptive: Secondary | ICD-10-CM

## 2014-04-20 DIAGNOSIS — Z309 Encounter for contraceptive management, unspecified: Secondary | ICD-10-CM

## 2014-04-20 DIAGNOSIS — IMO0001 Reserved for inherently not codable concepts without codable children: Secondary | ICD-10-CM

## 2014-04-20 LAB — POCT URINE PREGNANCY: PREG TEST UR: NEGATIVE

## 2014-04-20 NOTE — Patient Instructions (Signed)
It was nice to see you!  We put the nexplanon in your arm today. Keep the bandage on for 24 hours to minimize bruising. Apply ice tonight. The arm will be sore for a day or two.  Irregular bleeding is a common side effect, this usually gets better after 3 months.  I will see you back in 1 month to see how it is going and do a pap smear.

## 2014-04-20 NOTE — Progress Notes (Signed)
   Subjective:    Patient ID: Veronica [name], female    DOB: [birth year], 23 y.o.   MRN: [id number]  HPI Veronica LaughterKimberly R [name] is here for nexplanon insertion.  No acute issues.  Not sexually active.  Current Outpatient Prescriptions on File Prior to Visit  Medication Sig Dispense Refill  . esomeprazole (NEXIUM) 40 MG capsule Take 40 mg by mouth daily at 12 noon.      . Fe Fum-FePoly-FA-Vit C-Vit B3 (INTEGRA F) 125-1 MG CAPS Take 1 capsule by mouth daily.      Marland Kitchen. HYDROcodone-acetaminophen (NORCO/VICODIN) 5-325 MG per tablet Take 1 tablet by mouth every 4 (four) hours as needed.  10 tablet  0  . penicillin v potassium (VEETID) 500 MG tablet Take 1 tablet (500 mg total) by mouth 3 (three) times daily.  30 tablet  0   No current facility-administered medications on file prior to visit.    I have reviewed and updated the following as appropriate: allergies and current medications SHx: non smoker   Review of Systems     Objective:   Physical Exam BP 103/66  Pulse 67  Temp(Src) 98.2 F (36.8 C) (Oral)  Resp 18  Wt 275 lb (124.739 kg)  SpO2 99% Gen: alert, cooperative, NAD      Assessment & Plan:   Nexplanon Insertion Procedure Patient was given informed consent, she signed consent form.  Patient does understand that irregular bleeding is a very common side effect of this medication. She was advised to have backup contraception for one week after placement. Pregnancy test in clinic today was negative.  Appropriate time out taken.  Patient's left arm was prepped and draped in the usual sterile fashion.. The ruler used to measure and mark insertion area.  Patient was prepped with alcohol swab and then injected with 3 ml of 1% lidocaine.  She was prepped with betadine, Nexplanon removed from packaging,  Device confirmed in needle, then inserted full length of needle and withdrawn per handbook instructions. Nexplanon was able to palpated in the patient's arm; patient palpated the insert  herself. There was minimal blood loss.  Patient insertion site covered with guaze and a pressure bandage to reduce any bruising.  The patient tolerated the procedure well and was given post procedure instructions.

## 2014-04-20 NOTE — Assessment & Plan Note (Signed)
Nexplanon inserted today. Post care instructions given. F/u in 1 month for pap smear.

## 2014-04-21 MED ORDER — ETONOGESTREL 68 MG ~~LOC~~ IMPL: 68.0000 mg | DRUG_IMPLANT | Freq: Once | SUBCUTANEOUS | Status: DC

## 2014-04-21 NOTE — Addendum Note (Signed)
Addended by: Henri MedalHARTSELL, JAZMIN M on: 04/21/2014 03:51 PM   Modules accepted: Orders

## 2014-07-26 ENCOUNTER — Ambulatory Visit (INDEPENDENT_AMBULATORY_CARE_PROVIDER_SITE_OTHER): Payer: Medicaid Other | Admitting: Family Medicine

## 2014-07-26 ENCOUNTER — Encounter: Payer: Self-pay | Admitting: Family Medicine

## 2014-07-26 VITALS — BP 113/83 | HR 84 | Temp 97.4°F | Wt 275.0 lb

## 2014-07-26 DIAGNOSIS — R300 Dysuria: Secondary | ICD-10-CM

## 2014-07-26 LAB — POCT URINALYSIS DIPSTICK
Blood, UA: NEGATIVE
Glucose, UA: NEGATIVE
NITRITE UA: NEGATIVE
PH UA: 7.5
Protein, UA: 100
SPEC GRAV UA: 1.02
Urobilinogen, UA: 2

## 2014-07-26 LAB — POCT UA - MICROSCOPIC ONLY

## 2014-07-26 MED ORDER — CEPHALEXIN 500 MG PO CAPS: 500.0000 mg | ORAL_CAPSULE | Freq: Four times a day (QID) | ORAL | Status: DC

## 2014-07-26 NOTE — Progress Notes (Signed)
   Subjective:    Patient ID: Veronica [name], female    DOB: May 26, 1991, [age] y.o.   MRN: [id number]  HPI  Dysuria: Symptoms started 4 days ago. Has associated frequency and urgency. No fever, nausea or vomiting.  History  Substance Use Topics  . Smoking status: Never Smoker   . Smokeless tobacco: Never Used  . Alcohol Use: No     Review of Systems Per HPI    Objective:  BP 113/83  Pulse 84  Temp(Src) 97.4 F (36.3 C) (Oral)  Wt 275 lb (124.739 kg)  Physical Exam  Gen: well appearing female GI: soft, non-tender and non-distended abdomen. No suprapubic tenderness Back: no CVA tenderness     Assessment & Plan:

## 2014-07-26 NOTE — Patient Instructions (Signed)
Veronica BradfordKimberly R [name], it was a pleasure seeing you today. Today we talked about your painful urination. I will treat you for a urinary tract infection. I will get a culture and update you if there needs to be a change in management.  If you have any questions or concerns, please do not hesitate to call the office at [phone].  Sincerely,  Jacquelin Hawkingalph Marylou Wages, MD

## 2014-07-26 NOTE — Assessment & Plan Note (Signed)
Will treat with antibiotics due to symptoms. Urinalysis soft for UTI since dirty catch. Attempted to obtain culture but sample was destroyed before order was carried out.

## 2014-09-04 ENCOUNTER — Ambulatory Visit: Payer: Medicaid Other | Admitting: Family Medicine

## 2014-11-07 ENCOUNTER — Ambulatory Visit (INDEPENDENT_AMBULATORY_CARE_PROVIDER_SITE_OTHER): Payer: Medicaid Other | Admitting: Family Medicine

## 2014-11-07 ENCOUNTER — Other Ambulatory Visit (HOSPITAL_COMMUNITY)
Admission: RE | Admit: 2014-11-07 | Discharge: 2014-11-07 | Disposition: A | Discharge status: Home / Self Care | Payer: Medicaid Other | Source: Ambulatory Visit | Attending: Family Medicine | Admitting: Family Medicine

## 2014-11-07 ENCOUNTER — Encounter: Payer: Self-pay | Admitting: Family Medicine

## 2014-11-07 VITALS — BP 129/77 | HR 80 | Temp 98.1°F | Wt 278.0 lb

## 2014-11-07 DIAGNOSIS — Z113 Encounter for screening for infections with a predominantly sexual mode of transmission: Secondary | ICD-10-CM | POA: Insufficient documentation

## 2014-11-07 DIAGNOSIS — L299 Pruritus, unspecified: Secondary | ICD-10-CM

## 2014-11-07 DIAGNOSIS — Z01419 Encounter for gynecological examination (general) (routine) without abnormal findings: Secondary | ICD-10-CM | POA: Insufficient documentation

## 2014-11-07 DIAGNOSIS — Z124 Encounter for screening for malignant neoplasm of cervix: Secondary | ICD-10-CM

## 2014-11-07 LAB — COMPREHENSIVE METABOLIC PANEL
ALT: 10 U/L (ref 0–35)
AST: 7 U/L (ref 0–37)
Albumin: 3.9 g/dL (ref 3.5–5.2)
Alkaline Phosphatase: 60 U/L (ref 39–117)
BILIRUBIN TOTAL: 0.5 mg/dL (ref 0.2–1.2)
BUN: 12 mg/dL (ref 6–23)
CO2: 25 meq/L (ref 19–32)
CREATININE: 1 mg/dL (ref 0.50–1.10)
Calcium: 8.8 mg/dL (ref 8.4–10.5)
Chloride: 103 mEq/L (ref 96–112)
GLUCOSE: 87 mg/dL (ref 70–99)
Potassium: 3.9 mEq/L (ref 3.5–5.3)
SODIUM: 139 meq/L (ref 135–145)
Total Protein: 7.2 g/dL (ref 6.0–8.3)

## 2014-11-07 LAB — POCT WET PREP (WET MOUNT): CLUE CELLS WET PREP WHIFF POC: NEGATIVE

## 2014-11-07 LAB — CBC
HEMATOCRIT: 34.4 % — AB (ref 36.0–46.0)
HEMOGLOBIN: 12 g/dL (ref 12.0–15.0)
MCH: 26.9 pg (ref 26.0–34.0)
MCHC: 34.9 g/dL (ref 30.0–36.0)
MCV: 77.1 fL — AB (ref 78.0–100.0)
MPV: 9.6 fL (ref 9.4–12.4)
Platelets: 354 10*3/uL (ref 150–400)
RBC: 4.46 MIL/uL (ref 3.87–5.11)
RDW: 14.9 % (ref 11.5–15.5)
WBC: 9.8 10*3/uL (ref 4.0–10.5)

## 2014-11-07 NOTE — Patient Instructions (Signed)
For itching: -checking bloodwork -might be related to dry skin. Use thick cream/lotion or even vaseline and see if this helps -if not better follow up with Dr. Ermalinda MemosBradshaw for this  For STD testing: -checking bloodwork and swabs. We'll call you with results.  Be well, Dr. Randye LoboMcIntyre    Safe Sex Safe sex is about reducing the risk of giving or getting a sexually transmitted disease (STD). STDs are spread through sexual contact involving the genitals, mouth, or rectum. Some STDs can be cured and others cannot. Safe sex can also prevent unintended pregnancies.  WHAT ARE SOME SAFE SEX PRACTICES?  Limit your sexual activity to only one partner who is having sex with only you.  Talk to your partner about his or her past partners, past STDs, and drug use.  Use a condom every time you have sexual intercourse. This includes vaginal, oral, and anal sexual activity. Both females and males should wear condoms during oral sex. Only use latex or polyurethane condoms and water-based lubricants. Using petroleum-based lubricants or oils to lubricate a condom will weaken the condom and increase the chance that it will break. The condom should be in place from the beginning to the end of sexual activity. Wearing a condom reduces, but does not completely eliminate, your risk of getting or giving an STD. STDs can be spread by contact with infected body fluids and skin.  Get vaccinated for hepatitis B and HPV.  Avoid alcohol and recreational drugs, which can affect your judgment. You may forget to use a condom or participate in high-risk sex.  For females, avoid douching after sexual intercourse. Douching can spread an infection farther into the reproductive tract.  Check your body for signs of sores, blisters, rashes, or unusual discharge. See your health care provider if you notice any of these signs.  Avoid sexual contact if you have symptoms of an infection or are being treated for an STD. If you or your  partner has herpes, avoid sexual contact when blisters are present. Use condoms at all other times.  If you are at risk of being infected with HIV, it is recommended that you take a prescription medicine daily to prevent HIV infection. This is called pre-exposure prophylaxis (PrEP). You are considered at risk if:  You are a man who has sex with other men (MSM).  You are a heterosexual man or woman who is sexually active with more than one partner.  You take drugs by injection.  You are sexually active with a partner who has HIV.  Talk with your health care provider about whether you are at high risk of being infected with HIV. If you choose to begin PrEP, you should first be tested for HIV. You should then be tested every 3 months for as long as you are taking PrEP.  See your health care provider for regular screenings, exams, and tests for other STDs. Before having sex with a new partner, each of you should be screened for STDs and should talk about the results with each other. WHAT ARE THE BENEFITS OF SAFE SEX?   There is less chance of getting or giving an STD.  You can prevent unwanted or unintended pregnancies.  By discussing safe sex concerns with your partner, you may increase feelings of intimacy, comfort, trust, and honesty between the two of you. Document Released: 01/01/2005 Document Revised: 04/10/2014 Document Reviewed: 05/17/2012 [facility] Patient Information 2015 [place]. This information is not intended to replace advice given to you by your  health care provider. Make sure you discuss any questions you have with your health care provider.

## 2014-11-07 NOTE — Progress Notes (Signed)
Patient ID: Veronica [name], female   DOB: 04/15/1991, 23 y.o.   MRN: [id number]  HPI:  Pt presents for a same day appointment to discuss itching and STD check.  Has had itching all over body. No rash. No sores in mouth or genitals. No fevers. Has not done any medicines for the itching. No new soaps, foods, medicines. Uses baby oil and baby lotion. Using off-brand dove soap, started this 3 days ago but itching preceded that.  No pelvic pain or vaginal discharge. One female partner in the last 1.5 months. Not using protection. Has nexplanon for birth control. Does not get period while on nexplanon.   ROS: See HPI  PMFSH: hx obesity, GERD  PHYSICAL EXAM: BP 129/77 mmHg  Pulse 80  Temp(Src) 98.1 F (36.7 C) (Oral)  Wt 278 lb (126.1 kg) Gen: NAD, pleasant, cooperative HEENT: NCAT, oropharynx clear and moist without lesions Neuro: grossly nonfocal, speech normal GU: normal appearing external genitalia without lesions. Vagina is moist with thin discharge. Cervix normal in appearance. No cervical motion tenderness or tenderness on bimanual exam (although pt stated felt numb in pelvic area during bimanual, unclear why). No adnexal masses.  Skin: no rashes noted over arms or back  ASSESSMENT/PLAN:  1. STD screen: -will check gc/chlamydia/trichomonas, HIV, RPR, acute hepatitis panel today  -handout on safe sex -pap smear done today since pt is due and we were doing pelvic anyways  2. Itching: etiology not clear. Possibly related to dry skin, although no rashes seen by patient or on exam. No red flags. Since checking bloodwork anyways, will check CBC and CMET to evaluate for other potential etiologies of itching (hyperbilirubinemia, polycythemia, etc). Advised pt to use thick lotion/cream or vaseline for moisturization daily. F/u with PCP if not improving and labwork unremarkable.  FOLLOW UP: F/u as needed if symptoms worsen or do not improve.   GrenadaBrittany J. Pollie MeyerMcIntyre, MD [facility]

## 2014-11-08 LAB — HEPATITIS PANEL, ACUTE
HCV AB: NEGATIVE
HEP A IGM: NONREACTIVE
HEP B S AG: NEGATIVE
Hep B C IgM: NONREACTIVE

## 2014-11-08 LAB — HIV ANTIBODY (ROUTINE TESTING W REFLEX): HIV 1&2 Ab, 4th Generation: NONREACTIVE

## 2014-11-08 LAB — RPR

## 2014-11-09 LAB — CYTOLOGY - PAP

## 2014-11-10 ENCOUNTER — Telehealth: Payer: Self-pay | Admitting: Family Medicine

## 2014-11-10 LAB — CERVICOVAGINAL ANCILLARY ONLY
Chlamydia: NEGATIVE
NEISSERIA GONORRHEA: NEGATIVE

## 2014-11-10 NOTE — Telephone Encounter (Signed)
Pt called and would like all her test results from 12/1 . jw

## 2014-11-10 NOTE — Telephone Encounter (Signed)
Left message on voicemail for patient to call back. 

## 2014-11-13 NOTE — Telephone Encounter (Signed)
Spoke with patient and informed her of normal results.

## 2014-11-24 ENCOUNTER — Encounter: Payer: Self-pay | Admitting: Family Medicine

## 2015-02-05 ENCOUNTER — Emergency Department (HOSPITAL_COMMUNITY)
Admission: EM | Admit: 2015-02-05 | Discharge: 2015-02-05 | Disposition: A | Discharge status: Home / Self Care | Payer: Medicaid Other | Attending: Emergency Medicine | Admitting: Emergency Medicine

## 2015-02-05 ENCOUNTER — Encounter (HOSPITAL_COMMUNITY): Payer: Self-pay | Admitting: *Deleted

## 2015-02-05 DIAGNOSIS — D649 Anemia, unspecified: Secondary | ICD-10-CM | POA: Insufficient documentation

## 2015-02-05 DIAGNOSIS — L905 Scar conditions and fibrosis of skin: Secondary | ICD-10-CM

## 2015-02-05 DIAGNOSIS — Z9889 Other specified postprocedural states: Secondary | ICD-10-CM | POA: Insufficient documentation

## 2015-02-05 DIAGNOSIS — K219 Gastro-esophageal reflux disease without esophagitis: Secondary | ICD-10-CM | POA: Insufficient documentation

## 2015-02-05 DIAGNOSIS — E669 Obesity, unspecified: Secondary | ICD-10-CM | POA: Insufficient documentation

## 2015-02-05 DIAGNOSIS — Z98891 History of uterine scar from previous surgery: Secondary | ICD-10-CM

## 2015-02-05 DIAGNOSIS — G8918 Other acute postprocedural pain: Secondary | ICD-10-CM | POA: Insufficient documentation

## 2015-02-05 DIAGNOSIS — Z79899 Other long term (current) drug therapy: Secondary | ICD-10-CM | POA: Insufficient documentation

## 2015-02-05 DIAGNOSIS — Z3202 Encounter for pregnancy test, result negative: Secondary | ICD-10-CM | POA: Insufficient documentation

## 2015-02-05 DIAGNOSIS — R52 Pain, unspecified: Secondary | ICD-10-CM

## 2015-02-05 LAB — URINALYSIS, ROUTINE W REFLEX MICROSCOPIC
Bilirubin Urine: NEGATIVE
Glucose, UA: NEGATIVE mg/dL
Hgb urine dipstick: NEGATIVE
Ketones, ur: NEGATIVE mg/dL
Leukocytes, UA: NEGATIVE
NITRITE: NEGATIVE
Protein, ur: NEGATIVE mg/dL
SPECIFIC GRAVITY, URINE: 1.024 (ref 1.005–1.030)
UROBILINOGEN UA: 1 mg/dL (ref 0.0–1.0)
pH: 6.5 (ref 5.0–8.0)

## 2015-02-05 LAB — PREGNANCY, URINE: Preg Test, Ur: NEGATIVE

## 2015-02-05 NOTE — ED Notes (Signed)
Pt reports pain at incision site where c-section was x 4 years. No fever, no nausea/vomiting.

## 2015-02-05 NOTE — ED Provider Notes (Signed)
CSN: [id number]     Arrival date & time 02/05/15  1813 History   First MD Initiated Contact with Patient 02/05/15 2106     Chief Complaint  Patient presents with  . Abdominal Pain     (Consider location/radiation/quality/duration/timing/severity/associated sxs/prior Treatment) Patient is a 24 y.o. female presenting with abdominal pain. The history is provided by the patient and medical records. No language interpreter was used.  Abdominal Pain Associated symptoms: no chest pain, no constipation, no cough, no diarrhea, no dysuria, no fatigue, no fever, no hematuria, no nausea, no shortness of breath and no vomiting      Veronica [name] is a 24 y.o. female  with a hx of obesity, GERD, hiatal hernia, anemia presents to the Emergency Department complaining of gradual, persistent, progressively worsening pain and tenderness along her cesarean section scar onset 4 years ago after she had her son. Patient reports that it has gradually worsened in the last 5 months. Patient's mother is at bedside reports the patient has been on a significant amount weight in the last few months.  Patient reports that there is been no acute change in her pain she just "can't take it anymore."  She reports that her C-section was performed by Dr. Gaynell Face however she has not attempted to follow-up with him, her primary care provider or any other providers about this pain. She denies abdominal pain, nausea, vomiting, diarrhea, dysuria, hematuria, vaginal discharge, fevers, chills. Patient is sexually active however she is on the neck splint on and has not had a menses in greater than one year.   Past Medical History  Diagnosis Date  . GERD (gastroesophageal reflux disease)   . Obesity (BMI 30-39.9)   . Hiatal hernia   . Anemia   . Ankle pain    Past Surgical History  Procedure Laterality Date  . Cesarean section  09/02/2010    For macrosomia, but son was 7 lb 12 oz  . Tonsillectomy and adenoidectomy     Family  History  Problem Relation Age of Onset  . Diabetes Mother   . Hypertension Mother   . Obesity Mother   . Obesity Sister   . Osteochondroma Sister   . Diabetes Sister   . Hypertension Sister    History  Substance Use Topics  . Smoking status: Never Smoker   . Smokeless tobacco: Never Used  . Alcohol Use: No   OB History    No data available     Review of Systems  Constitutional: Negative for fever, diaphoresis, appetite change, fatigue and unexpected weight change.  HENT: Negative for mouth sores.   Eyes: Negative for visual disturbance.  Respiratory: Negative for cough, chest tightness, shortness of breath and wheezing.   Cardiovascular: Negative for chest pain.  Gastrointestinal: Negative for nausea, vomiting, diarrhea and constipation.       Pain in c-section scar  Endocrine: Negative for polydipsia, polyphagia and polyuria.  Genitourinary: Negative for dysuria, urgency, frequency and hematuria.  Musculoskeletal: Negative for back pain and neck stiffness.  Skin: Negative for rash.  Allergic/Immunologic: Negative for immunocompromised state.  Neurological: Negative for syncope, light-headedness and headaches.  Hematological: Does not bruise/bleed easily.  Psychiatric/Behavioral: Negative for sleep disturbance. The patient is not nervous/anxious.       Allergies  Morphine and related  Home Medications   Prior to Admission medications   Medication Sig Start Date End Date Taking? Authorizing Provider  esomeprazole (NEXIUM) 40 MG capsule Take 40 mg by mouth daily at 12 noon.  Yes Historical Provider, MD  Fe Fum-FePoly-FA-Vit C-Vit B3 (INTEGRA F) 125-1 MG CAPS Take 1 capsule by mouth daily.   Yes Historical Provider, MD  cephALEXin (KEFLEX) 500 MG capsule Take 1 capsule (500 mg total) by mouth 4 (four) times daily. Patient not taking: Reported on 02/05/2015 07/26/14   Jacquelin Hawking, MD  HYDROcodone-acetaminophen (NORCO/VICODIN) 5-325 MG per tablet Take 1 tablet by mouth  every 4 (four) hours as needed. Patient not taking: Reported on 02/05/2015 04/14/14   Dahlia Client Shaquisha Wynn, [name]  penicillin v potassium (VEETID) 500 MG tablet Take 1 tablet (500 mg total) by mouth 3 (three) times daily. Patient not taking: Reported on 02/05/2015 04/14/14   Dahlia Client Pookela Sellin, [name]   BP 108/64 mmHg  Pulse 80  Temp(Src) 98 F (36.7 C) (Oral)  Resp 16  Ht  (1.6 m)  Wt 275 lb (124.739 kg)  BMI 48.73 kg/m2  SpO2 100% Physical Exam  Constitutional: She appears well-developed and well-nourished. No distress.  Awake, alert, nontoxic appearance  HENT:  Head: Normocephalic and atraumatic.  Mouth/Throat: Oropharynx is clear and moist. No oropharyngeal exudate.  Eyes: Conjunctivae are normal. No scleral icterus.  Neck: Normal range of motion. Neck supple.  Cardiovascular: Normal rate, regular rhythm, normal heart sounds and intact distal pulses.   Pulmonary/Chest: Effort normal and breath sounds normal. No respiratory distress. She has no wheezes.  Equal chest expansion  Abdominal: Soft. Bowel sounds are normal. She exhibits no mass. There is no tenderness. There is no rebound and no guarding.  Well healed cesarean scar Tenderness to palpation along the right side of the scar without palpable mass. No dehiscence. No palpable hernia. No inguinal lymphadenopathy. No suprapubic or right or left quadrant abdominal pain  Musculoskeletal: Normal range of motion. She exhibits no edema.  Neurological: She is alert.  Speech is clear and goal oriented Moves extremities without ataxia  Skin: Skin is warm and dry. She is not diaphoretic.  Psychiatric: She has a normal mood and affect.  Nursing note and vitals reviewed.   ED Course  Procedures (including critical care time) Labs Review Labs Reviewed  URINALYSIS, ROUTINE W REFLEX MICROSCOPIC  PREGNANCY, URINE    Imaging Review No results found.   EKG Interpretation None      MDM   Final diagnoses:  History of cesarean  section, unknown scar  Pain in surgical scar    Veronica [name] presents with 4 years of pain at the site of her c-section with worsening in the last 5 months.  Pt here today because she is tired of hurting, but is without acute change.     UA without evidence of urinary tract infection and pregnancy test negative. NO clinical evidence of hernia, abscess, dehiscence.  Doubt appendicitis, ovarian torsion, PID, diverticulitis.  Pt is to follow-up with OB/GYN.  I have personally reviewed patient's vitals, nursing note and any pertinent labs or imaging.  I performed an focused physical exam; undressed when appropriate .    It has been determined that no acute conditions requiring further emergency intervention are present at this time. The patient/guardian have been advised of the diagnosis and plan. I reviewed any labs and imaging including any potential incidental findings. We have discussed signs and symptoms that warrant return to the ED and they are listed in the discharge instructions.    Vital signs are stable at discharge.   BP 108/64 mmHg  Pulse 80  Temp(Src) 98 F (36.7 C) (Oral)  Resp 16  Ht  (  1.6 m)  Wt 275 lb (124.739 kg)  BMI 48.73 kg/m2  SpO2 100%        Dierdre ForthHannah Soua Lenk, [name] 02/06/15 [id number]  Ethelda ChickMartha K Linker, MD 02/09/15 1029

## 2015-02-05 NOTE — Discharge Instructions (Signed)
1. Medications: ibuprofen for pain, usual home medications 2. Treatment: rest, drink plenty of fluids,  3. Follow Up: Please followup with the women's outpatient clinic in 3 days for discussion of your diagnoses and further evaluation after today's visit; if you do not have a primary care doctor use the resource guide provided to find one;

## 2015-02-05 NOTE — ED Notes (Signed)
Pt made aware to return if symptoms worsen or if any life threatening symptoms occur.   

## 2015-04-03 ENCOUNTER — Encounter: Payer: Self-pay | Admitting: Family Medicine

## 2015-04-03 ENCOUNTER — Ambulatory Visit (INDEPENDENT_AMBULATORY_CARE_PROVIDER_SITE_OTHER): Payer: Self-pay | Admitting: Family Medicine

## 2015-04-03 VITALS — BP 129/74 | HR 80 | Temp 98.4°F | Wt 294.0 lb

## 2015-04-03 DIAGNOSIS — R109 Unspecified abdominal pain: Secondary | ICD-10-CM

## 2015-04-03 DIAGNOSIS — R102 Pelvic and perineal pain: Secondary | ICD-10-CM

## 2015-04-03 LAB — POCT URINE PREGNANCY: Preg Test, Ur: NEGATIVE

## 2015-04-03 MED ORDER — GABAPENTIN 100 MG PO CAPS: 100.0000 mg | ORAL_CAPSULE | Freq: Three times a day (TID) | ORAL | Status: DC

## 2015-04-03 NOTE — Progress Notes (Signed)
Patient ID: Veronica [name], female   DOB: 12/05/1991, 24 y.o.   MRN: [id number] Subjective:   CC: Abdominal pain at C-section scar  HPI:   Patient presents to same-day clinic for abdominal pain at C-section scar for the past 4 years, worsening over the past 3 months. She states that pain is daily, sharp and stabbing, and is just under the skin where she had her C-section scar. She think she feels a knot in this area. She denies dysuria, pain or change in bowel movements, vaginal bleeding, nausea, vomiting, fevers, chills, radiation of pain, skin changes, or new medications. Ibuprofen does not help. It hurts if she sits too long and then stands up. It is slightly numb and tingling occasionally. C-section was performed by Dr. Gaynell FaceMarshall 4 years ago. Denies vaginal discharge and states she has not been sexually active in at least one year. Is not concerned about STDs.   Review of Systems - Per HPI.   PMH - obesity, anemia, GERD, pelvic pain SH - states not sexually active x 1 year    Objective:  Physical Exam BP 129/74 mmHg  Pulse 80  Temp(Src) 98.4 F (36.9 C) (Oral)  Wt 294 lb (133.358 kg)  LMP 04/02/2014 (Approximate) GEN: NAD ABD: obese, soft, mildly tender RLQ with subtle 2-3cm mass, possibly stool vs scar tissue, no scar tissue/keloid seen, no guarding or rebound, no erythema or warmth, no suprapubic tenderness    Assessment:     Veronica [name] is a 24 y.o. female here for abdominal pain over c-section scar.    Plan:     # See problem list and after visit summary for problem-specific plans.   # Health Maintenance: Not discussed  Follow-up: Follow up in 2-4 weeks PRN no improvement in pain.   Leona SingletonMaria T Casie Sturgeon, MD [facility]

## 2015-04-03 NOTE — Assessment & Plan Note (Signed)
Unclear etiology of chronic RLQ/pelvic pain described as "at c-section scar", evaluation in 2012 here and in ED 2 months ago with negative UA, UPT, and no evidence of hernia, abscess, dehisence acute abdomen, PID, ovarian torsion, or diverticulitis. At that time recommended follow-up with OB/GYN. Currently, afebrile with no major change in symptoms. Abdomen is mildly tender RLQ with potential mass palpated that is stool versus scar tissue most likely (just above c-section well-healed scar). Some neuropathic component to pain. Declined STD testing at this time. -Gabapentin 100mg  3 TID PRN pain. -UPT and pelvic ultrasound ordered. -Urged to follow-up with Dr. Gaynell FaceMarshall if ultrasound is indeterminate.

## 2015-04-03 NOTE — Patient Instructions (Addendum)
We are getting a pregnancy test today and I have ordered an ultrasound of your pelvis. If you do not get a call to schedule this, call us in 1 week. I will call you with results. Follow up with Dr Gaynell FaceMarshall if this ultrasound is indeterminate. Take gabapentin for what sounds like probable nerve related pain from c-section. Take 1-3 times daily as needed. It can cause drowsiness.  Best,  Leona SingletonMaria T Erlin Gardella, MD

## 2015-04-04 ENCOUNTER — Telehealth: Payer: Self-pay | Admitting: Family Medicine

## 2015-04-04 NOTE — Telephone Encounter (Signed)
Called to notify patient that Upreg was negative and that we would get in touch with her once transvaginal US had been done and read. She voiced understanding.  Leona SingletonMaria T Cordae Mccarey, MD

## 2015-04-20 ENCOUNTER — Encounter: Payer: Self-pay | Admitting: Family Medicine

## 2015-04-20 ENCOUNTER — Ambulatory Visit (INDEPENDENT_AMBULATORY_CARE_PROVIDER_SITE_OTHER): Payer: Self-pay | Admitting: Family Medicine

## 2015-04-20 VITALS — BP 148/78 | HR 83 | Temp 98.5°F | Wt 295.0 lb

## 2015-04-20 DIAGNOSIS — R197 Diarrhea, unspecified: Secondary | ICD-10-CM | POA: Insufficient documentation

## 2015-04-20 DIAGNOSIS — R1030 Lower abdominal pain, unspecified: Secondary | ICD-10-CM

## 2015-04-20 DIAGNOSIS — R109 Unspecified abdominal pain: Secondary | ICD-10-CM

## 2015-04-20 MED ORDER — GABAPENTIN 300 MG PO CAPS: 300.0000 mg | ORAL_CAPSULE | Freq: Three times a day (TID) | ORAL | Status: DC

## 2015-04-20 NOTE — Patient Instructions (Addendum)
  I want you to increase your gabapentin to 2 tabs 3 times a day until your prescription runs out., and then the new dose that he will pick up at the pharmacy will be a higher dose of 300 mg, when you pick up your new dose it'll be one tab 3 times a day. We are scheduling your ultrasound today, and we will call you with the results once becomes available. If you have any increase in pain, or changes in bowel habits and please be seen immediately.

## 2015-04-20 NOTE — Progress Notes (Signed)
   Subjective:    Patient ID: Veronica [name], female    DOB: [birth year], 24 y.o.   MRN: [id number]  HPI   Lower abdominal/pelvic pain: Patient presents to same day appointment for lower abdominal pain located on the right side of her 24-year-old C-section scar. She states this pain has been there for some time, she was evaluated for last month, and was started on gabapentin. Ultrasound was also ordered at that time, however patient did not schedule this. She denies any fever, changes in bowel habit, changes in skin. She does however feel that the pain has mildly increased, causing her to walk differently, not because of radiation into her legs, but in the attempt to try to ease the pain that her C-section scar.  Past Medical History  Diagnosis Date  . GERD (gastroesophageal reflux disease)   . Obesity (BMI 30-39.9)   . Hiatal hernia   . Anemia   . Ankle pain     Allergies  Allergen Reactions  . Morphine And Related     Causes "chest to be heavy"     Review of Systems Per HPI    Objective:   Physical Exam BP 148/78 mmHg  Pulse 83  Temp(Src) 98.5 F (36.9 C) (Oral)  Wt 295 lb (133.811 kg)  LMP 04/02/2014 (Approximate) Gen: NAD. Nontoxic in appearance, well-developed, well-nourished, morbidly obese African-American female. Cooperative with exam. Moves Easily on table.  Abd: Soft. Obese.ND. BS positive. C-section scar well-healed, no obvious dehiscence. Small 3-4 cm mass palpated right corner/superior of C-section scar. No drainage. No erythema.       Assessment & Plan:

## 2015-04-20 NOTE — Assessment & Plan Note (Signed)
Patient with continued abdominal pain that old C-section site, over 24 years old. There is a palpable mass, although small in nature that is tender to palpation. No red flags or signs of infection on exam today. Patient states that her habits are normal. - Patient was evaluated a few weeks ago for same issue, where ultrasound was ordered for her, and she has not been able to schedule that as of yet. We have scheduled that for her today, we will follow-up with her once results are available. - Patient was on gabapentin on last visit, we have increased this today she is to increase her taper up slowly, directions was given to her for this. A new prescription was called in for her after she completes her current prescription for a higher dose of 300 mg 3 times a day. - Follow-up depending on resolution of symptoms gabapentin and ultrasound results.

## 2015-04-27 ENCOUNTER — Ambulatory Visit (HOSPITAL_COMMUNITY)
Admission: RE | Admit: 2015-04-27 | Discharge: 2015-04-27 | Disposition: A | Discharge status: Home / Self Care | Payer: Medicaid Other | Source: Ambulatory Visit | Attending: Family Medicine | Admitting: Family Medicine

## 2015-04-27 ENCOUNTER — Telehealth: Payer: Self-pay | Admitting: Family Medicine

## 2015-04-27 DIAGNOSIS — R1030 Lower abdominal pain, unspecified: Secondary | ICD-10-CM

## 2015-04-27 DIAGNOSIS — R102 Pelvic and perineal pain: Secondary | ICD-10-CM | POA: Insufficient documentation

## 2015-04-27 DIAGNOSIS — R109 Unspecified abdominal pain: Secondary | ICD-10-CM

## 2015-04-27 NOTE — Telephone Encounter (Addendum)
Dicussed pts normal US with her. Her ovaries were not well visualized, however her pain is more incisional/superficial at old c-section scar and I do not feel her ovaries are the cause of her pain. She is not getting great benefit from her increase in gabapentin. I have discussed options with her today, including surgical referral for evaluation. Patient decided to go ahead and be evaluated by surgery. Please help her setting up this referral. Thanks.

## 2015-04-30 ENCOUNTER — Ambulatory Visit (HOSPITAL_COMMUNITY): Payer: Medicaid Other

## 2015-05-01 ENCOUNTER — Other Ambulatory Visit: Payer: Self-pay | Admitting: Family Medicine

## 2015-07-30 ENCOUNTER — Other Ambulatory Visit: Payer: Self-pay | Admitting: *Deleted

## 2015-07-30 DIAGNOSIS — D649 Anemia, unspecified: Secondary | ICD-10-CM

## 2015-07-30 MED ORDER — INTEGRA F 125-1 MG PO CAPS: 1.0000 | ORAL_CAPSULE | Freq: Every day | ORAL | Status: DC

## 2015-08-01 ENCOUNTER — Other Ambulatory Visit: Payer: Self-pay | Admitting: Internal Medicine

## 2015-08-02 ENCOUNTER — Other Ambulatory Visit: Payer: Self-pay | Admitting: Internal Medicine

## 2015-09-29 ENCOUNTER — Encounter (HOSPITAL_COMMUNITY): Payer: Self-pay | Admitting: Emergency Medicine

## 2015-09-29 ENCOUNTER — Emergency Department (INDEPENDENT_AMBULATORY_CARE_PROVIDER_SITE_OTHER)
Admission: EM | Admit: 2015-09-29 | Discharge: 2015-09-29 | Disposition: A | Discharge status: Home / Self Care | Payer: Self-pay | Source: Home / Self Care | Attending: Family Medicine | Admitting: Family Medicine

## 2015-09-29 DIAGNOSIS — M792 Neuralgia and neuritis, unspecified: Secondary | ICD-10-CM

## 2015-09-29 NOTE — Discharge Instructions (Signed)
Neuropathic Pain Neuropathic pain is pain caused by damage to the nerves that are responsible for certain sensations in your body (sensory nerves). The pain can be caused by damage to:   The sensory nerves that send signals to your spinal cord and brain (peripheral nervous system).  The sensory nerves in your brain or spinal cord (central nervous system). Neuropathic pain can make you more sensitive to pain. What would be a Sarkisyan sensation for most people may feel very painful if you have neuropathic pain. This is usually a long-term condition that can be difficult to treat. The type of pain can differ from person to person. It may start suddenly (acute), or it may develop slowly and last for a long time (chronic). Neuropathic pain may come and go as damaged nerves heal or may stay at the same level for years. It often causes emotional distress, loss of sleep, and a lower quality of life. CAUSES  The most common cause of damage to a sensory nerve is diabetes. Many other diseases and conditions can also cause neuropathic pain. Causes of neuropathic pain can be classified as:  Toxic. Many drugs and chemicals can cause toxic damage. The most common cause of toxic neuropathic pain is damage from drug treatment for cancer (chemotherapy).  Metabolic. This type of pain can happen when a disease causes imbalances that damage nerves. Diabetes is the most common of these diseases. Vitamin B deficiency caused by long-term alcohol abuse is another common cause.  Traumatic. Any injury that cuts, crushes, or stretches a nerve can cause damage and pain. A common example is feeling pain after losing an arm or leg (phantom limb pain).  Compression-related. If a sensory nerve gets trapped or compressed for a long period of time, the blood supply to the nerve can be cut off.  Vascular. Many blood vessel diseases can cause neuropathic pain by decreasing blood supply and oxygen to nerves.  Autoimmune. This type of  pain results from diseases in which the body's defense system mistakenly attacks sensory nerves. Examples of autoimmune diseases that can cause neuropathic pain include lupus and multiple sclerosis.  Infectious. Many types of viral infections can damage sensory nerves and cause pain. Shingles infection is a common cause of this type of pain.  Inherited. Neuropathic pain can be a symptom of many diseases that are passed down through families (genetic). SIGNS AND SYMPTOMS  The main symptom is pain. Neuropathic pain is often described as:  Burning.  Shock-like.  Stinging.  Hot or cold.  Itching. DIAGNOSIS  No single test can diagnose neuropathic pain. Your health care provider will do a physical exam and ask you about your pain. You may use a pain scale to describe how bad your pain is. You may also have tests to see if you have a high sensitivity to pain and to help find the cause and location of any sensory nerve damage. These tests may include:  Imaging studies, such as:  X-rays.  CT scan.  MRI.  Nerve conduction studies to test how well nerve signals travel through your sensory nerves (electrodiagnostic testing).  Stimulating your sensory nerves through electrodes on your skin and measuring the response in your spinal cord and brain (somatosensory evoked potentials). TREATMENT  Treatment for neuropathic pain may change over time. You may need to try different treatment options or a combination of treatments. Some options include:  Over-the-counter pain relievers.  Prescription medicines. Some medicines used to treat other conditions may also help neuropathic pain. These   include medicines to:  Control seizures (anticonvulsants).  Relieve depression (antidepressants).  Prescription-strength pain relievers (narcotics). These are usually used when other pain relievers do not help.  Transcutaneous nerve stimulation (TENS). This uses electrical currents to block painful nerve  signals. The treatment is painless.  Topical and local anesthetics. These are medicines that numb the nerves. They can be injected as a nerve block or applied to the skin.  Alternative treatments, such as:  Acupuncture.  Meditation.  Massage.  Physical therapy.  Pain management programs.  Counseling. HOME CARE INSTRUCTIONS  Learn as much as you can about your condition.  Take medicines only as directed by your health care provider.  Work closely with all your health care providers to find what works best for you.  Have a good support system at home.  Consider joining a chronic pain support group. SEEK MEDICAL CARE IF:  Your pain treatments are not helping.  You are having side effects from your medicines.  You are struggling with fatigue, mood changes, depression, or anxiety.   This information is not intended to replace advice given to you by your health care provider. Make sure you discuss any questions you have with your health care provider.   Document Released: 08/21/2004 Document Revised: 12/15/2014 Document Reviewed: 05/04/2014 Elsevier Interactive Patient Education 2016 [employer] Inc[employer]  

## 2015-09-29 NOTE — ED Provider Notes (Signed)
CSN: [id number]     Arrival date & time 09/29/15  1502 History   First MD Initiated Contact with Patient 09/29/15 1538     Chief Complaint  Patient presents with  . Abdominal Pain   (Consider location/radiation/quality/duration/timing/severity/associated sxs/prior Treatment) HPI Comments: 24 year old morbidly obese female is complaining of right lower abdominal pain for 5 years since the time she had a C-section. She describes the pain as a stinging, burning, sharp and shooting type pain. It occurs intermittently but most of the time. Sometimes it will flareup M.D. worse than other times. She has been seen by PCP 2-3 times in the past couple of years. She is also had an abdominal pelvic ultrasound which turned out to be normal. She has no complaints of vaginal bleeding, lower pelvic pain, urinary symptoms or nausea, vomiting or diarrhea. She was started on gabapentin many months ago but she stopped it on her own because she ran out and never went back to have a follow-up or refill.   Past Medical History  Diagnosis Date  . GERD (gastroesophageal reflux disease)   . Obesity (BMI 30-39.9)   . Hiatal hernia   . Anemia   . Ankle pain    Past Surgical History  Procedure Laterality Date  . Cesarean section  09/02/2010    For macrosomia, but son was 7 lb 12 oz  . Tonsillectomy and adenoidectomy     Family History  Problem Relation Age of Onset  . Diabetes Mother   . Hypertension Mother   . Obesity Mother   . Obesity Sister   . Osteochondroma Sister   . Diabetes Sister   . Hypertension Sister    Social History  Substance Use Topics  . Smoking status: Never Smoker   . Smokeless tobacco: Never Used  . Alcohol Use: No   OB History    No data available     Review of Systems  Constitutional: Negative for fever, activity change, appetite change and fatigue.  HENT: Negative.   Respiratory: Negative.  Negative for cough and shortness of breath.   Cardiovascular: Negative.  Negative  for chest pain.  Gastrointestinal: Positive for abdominal pain. Negative for nausea, vomiting, diarrhea, constipation and blood in stool.  Genitourinary: Negative.   Musculoskeletal: Negative.  Negative for myalgias, back pain and neck pain.  Skin: Negative.   Neurological: Positive for numbness. Negative for dizziness, speech difficulty and headaches.    Allergies  Morphine and related  Home Medications   Prior to Admission medications   Medication Sig Start Date End Date Taking? Authorizing Provider  esomeprazole (NEXIUM) 40 MG capsule Take 40 mg by mouth daily at 12 noon.   Yes Historical Provider, MD  Fe Fum-FePoly-FA-Vit C-Vit B3 (INTEGRA F) 125-1 MG CAPS Take 1 capsule by mouth daily. 07/30/15  Yes Asiyah Mayra ReelZahra Mikell, MD  cephALEXin (KEFLEX) 500 MG capsule Take 1 capsule (500 mg total) by mouth 4 (four) times daily. Patient not taking: Reported on 02/05/2015 07/26/14   Narda Bondsalph A Nettey, MD  gabapentin (NEURONTIN) 300 MG capsule Take 1 capsule (300 mg total) by mouth 3 (three) times daily. 04/20/15   Renee A Kuneff, DO  HYDROcodone-acetaminophen (NORCO/VICODIN) 5-325 MG per tablet Take 1 tablet by mouth every 4 (four) hours as needed. Patient not taking: Reported on 02/05/2015 04/14/14   Dahlia ClientHannah Muthersbaugh, [name]  penicillin v potassium (VEETID) 500 MG tablet Take 1 tablet (500 mg total) by mouth 3 (three) times daily. Patient not taking: Reported on 02/05/2015 04/14/14   Dahlia ClientHannah  Muthersbaugh, [name]   Meds Ordered and Administered this Visit  Medications - No data to display  BP 103/69 mmHg  Pulse 87  Temp(Src) 98.4 F (36.9 C) (Oral)  SpO2 97% No data found.   Physical Exam  Constitutional: She is oriented to person, place, and time. She appears well-developed and well-nourished. No distress.  Eyes: EOM are normal.  Neck: Normal range of motion. Neck supple.  Cardiovascular: Normal rate.   Pulmonary/Chest: Effort normal. No respiratory distress.  Abdominal: Soft. She exhibits no  distension and no mass. There is no rebound and no guarding.  No abdominal tenderness. There is superficial numbness or decreased sensitivity to the lower right and lesser to the left abdomen. The examiner is able to "pinch" a roll of fat and palpate this area without involving the intra-abdominal contents and aggravate the pain pain and discomfort for which she has been complaining. There is no intra-abdominal tenderness. No pelvic tenderness.  Musculoskeletal: She exhibits no edema.  Neurological: She is alert and oriented to person, place, and time. She exhibits normal muscle tone.  Skin: Skin is warm and dry.  Psychiatric: She has a normal mood and affect.  Nursing note and vitals reviewed.   ED Course  Procedures (including critical care time)  Labs Review Labs Reviewed - No data to display  Imaging Review No results found.   Visual Acuity Review  Right Eye Distance:   Left Eye Distance:   Bilateral Distance:    Right Eye Near:   Left Eye Near:    Bilateral Near:         MDM   1. Neuralgia involving abdomen    The location and description of the pain now for 5 years status post C-section is similar to that of a neuralgia. It is possible that she has a nerve injury peripheral or superficial. [place] to see your PCP and possibly will need referral.    Hayden Rasmussen, NP 09/29/15 1627

## 2015-09-29 NOTE — ED Notes (Signed)
C/o abd pain ever since c-section x5 years ago Pain is constant; "stinging, burning, and sharp" Denies fevers, chills, urinary sx A&O x4... No acute distress

## 2015-11-21 ENCOUNTER — Other Ambulatory Visit (HOSPITAL_COMMUNITY)
Admission: RE | Admit: 2015-11-21 | Discharge: 2015-11-21 | Disposition: A | Discharge status: Home / Self Care | Payer: Medicaid Other | Source: Ambulatory Visit | Attending: Family Medicine | Admitting: Family Medicine

## 2015-11-21 ENCOUNTER — Ambulatory Visit (INDEPENDENT_AMBULATORY_CARE_PROVIDER_SITE_OTHER): Payer: Medicaid Other | Admitting: Family Medicine

## 2015-11-21 ENCOUNTER — Encounter: Payer: Self-pay | Admitting: Family Medicine

## 2015-11-21 VITALS — BP 138/84 | HR 91 | Temp 98.1°F | Ht 62.0 in | Wt 290.1 lb

## 2015-11-21 DIAGNOSIS — Z308 Encounter for other contraceptive management: Secondary | ICD-10-CM

## 2015-11-21 DIAGNOSIS — L219 Seborrheic dermatitis, unspecified: Secondary | ICD-10-CM

## 2015-11-21 DIAGNOSIS — Z7251 High risk heterosexual behavior: Secondary | ICD-10-CM

## 2015-11-21 DIAGNOSIS — Z202 Contact with and (suspected) exposure to infections with a predominantly sexual mode of transmission: Secondary | ICD-10-CM | POA: Diagnosis not present

## 2015-11-21 DIAGNOSIS — B3731 Acute candidiasis of vulva and vagina: Secondary | ICD-10-CM

## 2015-11-21 DIAGNOSIS — B373 Candidiasis of vulva and vagina: Secondary | ICD-10-CM | POA: Diagnosis not present

## 2015-11-21 DIAGNOSIS — Z113 Encounter for screening for infections with a predominantly sexual mode of transmission: Secondary | ICD-10-CM | POA: Insufficient documentation

## 2015-11-21 LAB — POCT WET PREP (WET MOUNT): Clue Cells Wet Prep Whiff POC: NEGATIVE

## 2015-11-21 MED ORDER — FLUCONAZOLE 150 MG PO TABS: 150.0000 mg | ORAL_TABLET | Freq: Once | ORAL | Status: DC

## 2015-11-21 NOTE — Progress Notes (Signed)
   Subjective: CC: concern for STD HPI: Veronica [name] is a 24 y.o. female presenting to clinic today for same day appointment.  PCP: Danella MaiersAsiyah Z Mikell, MD   Concerns today include:  Vaginal Discharge/ Concern for STI: Patient reports that she engaged in sexual intercourse about 1.5 weeks ago for the first time in 1 year.  She notes that discharge started 4 days ago.  She notes that discharge appears white, sticky.  She endorses vaginal itching/ irritation.  She endorses vaginal odors.  She denies abnormal vaginal bleeding, dysuria, hematuria, pelvic pain, nausea, vomiting, fevers.   She notes burning with wiping her vagina and bathing.  She has used nothing OTC.   No history of STIs.  She is newly sexually active and did not use condoms.  Contraception: Nexplanon, exp 04/2017.  No LMP recorded. Patient has had an implant.  Ear itching Patient notes that her ears have been itching for 2 weeks now.  She has not used anything to help them.  She denies fevers, pain, purulence or bleeding from her ears.  No recent swimming or submersion of ears under water. Occ uses q-tips.  Social History Reviewed. FamHx and MedHx reviewed.  Please see EMR. Health Maintenance: Flu shot due  ROS: Per HPI  Objective: Office vital signs reviewed. BP 138/84 mmHg  Pulse 91  Temp(Src) 98.1 F (36.7 C) (Oral)  Ht 5\' 2"  (1.575 m)  Wt 290 lb 1.6 oz (131.588 kg)  BMI 53.05 kg/m2  SpO2 99%  Physical Examination:  General: Awake, alert, obese, No acute distress HEENT: Normal, MMM, TMs in tact b/l, mild flaking of the skin in the external canals bilaterally, no erythema, no purulence GI: soft, non-tender, non-distended, bowel sounds present x4, no masses GU: external vaginal tissue normal, cervix retroverted, no punctate lesions on cervix appreciated, moderate white discharge from cervical os, no bleeding, no cervical motion tenderness, no abdominal/ adnexal masses MSK: Normal gait and station  Results for  orders placed or performed in visit on 11/21/15 (from the past 24 hour(s))  POCT Wet Prep Mellody Drown(Wet [place])     Status: Abnormal   Collection Time: 11/21/15  2:15 PM  Result Value Ref Range   Source Wet Prep POC VAG    WBC, Wet Prep HPF POC 10-15    Bacteria Wet Prep HPF POC Moderate (A) None, Few, Too numerous to count   Clue Cells Wet Prep HPF POC None None, Too numerous to count   Clue Cells Wet Prep Whiff POC Negative Whiff    Yeast Wet Prep HPF POC Few    Trichomonas Wet Prep HPF POC NONE    Assessment/ Plan: 24 y.o. female   1. Unprotected sexual intercourse. - Discussed safe sex practices - Condoms provided today - Labs as below - Has Nexplanon  2. Possible exposure to STD. No abnormal findings on exam. - POCT Wet Prep ([place]et [place]) - HIV antibody - RPR - Cervicovaginal ancillary only - Will contact with results.  Per patient ok to leave voicemail.  Phone # verified during appt.  3. Seborrheic dermatitis, ears.  No evidence of secondary bacterial infection.  TMs normal. - Recommend mineral oil, see AVS  4. Vulvovaginal candidiasis - fluconazole (DIFLUCAN) 150 MG tablet; Take 1 tablet (150 mg total) by mouth once. May repeat in 3 days if not resolved.  Dispense: 2 tablet; Refill: 0   Raliegh IpAshly M Gottschalk, DO PGY-2, [facility]

## 2015-11-21 NOTE — Patient Instructions (Signed)
You can use Mineral oil for your itchy ears.  Apply a small amount to the tip of your finger and apply to each ear.  If you get fevers, ear pain or worsening itching come back and see your PCP.  We will contact you with the results of your labs.  Practice SAFE sex.

## 2015-11-22 LAB — CERVICOVAGINAL ANCILLARY ONLY
Chlamydia: NEGATIVE
Neisseria Gonorrhea: NEGATIVE

## 2015-11-22 LAB — HIV ANTIBODY (ROUTINE TESTING W REFLEX): HIV 1&2 Ab, 4th Generation: NONREACTIVE

## 2015-11-22 LAB — RPR

## 2015-11-23 ENCOUNTER — Telehealth: Payer: Self-pay | Admitting: Family Medicine

## 2015-11-23 NOTE — Telephone Encounter (Signed)
Attempted to contact x2 without response. Will route to nursing for continued attempts to contact.   Please let Veronica [name] know that labs obtained at last office visit are all normal.

## 2015-11-27 NOTE — Telephone Encounter (Signed)
Letter mailed to patient informing her of normal results. Rayvon Brandvold,CMA

## 2015-11-27 NOTE — Telephone Encounter (Signed)
LM for pt to call our office. Sunday SpillersSharon T Ivette Castronova, CMA

## 2015-11-27 NOTE — Telephone Encounter (Signed)
Message delivered

## 2016-02-08 ENCOUNTER — Encounter (HOSPITAL_COMMUNITY): Payer: Self-pay | Admitting: Emergency Medicine

## 2016-02-08 DIAGNOSIS — G8918 Other acute postprocedural pain: Secondary | ICD-10-CM | POA: Insufficient documentation

## 2016-02-08 DIAGNOSIS — R300 Dysuria: Secondary | ICD-10-CM | POA: Insufficient documentation

## 2016-02-08 DIAGNOSIS — M549 Dorsalgia, unspecified: Secondary | ICD-10-CM | POA: Insufficient documentation

## 2016-02-08 DIAGNOSIS — R197 Diarrhea, unspecified: Secondary | ICD-10-CM | POA: Insufficient documentation

## 2016-02-08 DIAGNOSIS — K219 Gastro-esophageal reflux disease without esophagitis: Secondary | ICD-10-CM | POA: Insufficient documentation

## 2016-02-08 DIAGNOSIS — R319 Hematuria, unspecified: Secondary | ICD-10-CM | POA: Insufficient documentation

## 2016-02-08 DIAGNOSIS — E669 Obesity, unspecified: Secondary | ICD-10-CM | POA: Insufficient documentation

## 2016-02-08 DIAGNOSIS — Z79899 Other long term (current) drug therapy: Secondary | ICD-10-CM | POA: Insufficient documentation

## 2016-02-08 DIAGNOSIS — Z3202 Encounter for pregnancy test, result negative: Secondary | ICD-10-CM | POA: Insufficient documentation

## 2016-02-08 LAB — CBC
HCT: 35.5 % — ABNORMAL LOW (ref 36.0–46.0)
Hemoglobin: 11.6 g/dL — ABNORMAL LOW (ref 12.0–15.0)
MCH: 27.1 pg (ref 26.0–34.0)
MCHC: 32.7 g/dL (ref 30.0–36.0)
MCV: 82.9 fL (ref 78.0–100.0)
PLATELETS: 344 10*3/uL (ref 150–400)
RBC: 4.28 MIL/uL (ref 3.87–5.11)
RDW: 14.6 % (ref 11.5–15.5)
WBC: 8.9 10*3/uL (ref 4.0–10.5)

## 2016-02-08 LAB — URINE MICROSCOPIC-ADD ON

## 2016-02-08 LAB — URINALYSIS, ROUTINE W REFLEX MICROSCOPIC
BILIRUBIN URINE: NEGATIVE
Glucose, UA: NEGATIVE mg/dL
Ketones, ur: NEGATIVE mg/dL
Nitrite: NEGATIVE
PROTEIN: 30 mg/dL — AB
Specific Gravity, Urine: 1.024 (ref 1.005–1.030)
pH: 6 (ref 5.0–8.0)

## 2016-02-08 LAB — COMPREHENSIVE METABOLIC PANEL
ALT: 12 U/L — ABNORMAL LOW (ref 14–54)
ANION GAP: 12 (ref 5–15)
AST: 11 U/L — AB (ref 15–41)
Albumin: 3.5 g/dL (ref 3.5–5.0)
Alkaline Phosphatase: 56 U/L (ref 38–126)
BILIRUBIN TOTAL: 0.5 mg/dL (ref 0.3–1.2)
BUN: 11 mg/dL (ref 6–20)
CALCIUM: 8.8 mg/dL — AB (ref 8.9–10.3)
CO2: 25 mmol/L (ref 22–32)
CREATININE: 1.03 mg/dL — AB (ref 0.44–1.00)
Chloride: 101 mmol/L (ref 101–111)
GFR calc Af Amer: >60 mL/min (ref >60)
GLUCOSE: 100 mg/dL — AB (ref 65–99)
POTASSIUM: 3.7 mmol/L (ref 3.5–5.1)
Sodium: 138 mmol/L (ref 135–145)
Total Protein: 7.2 g/dL (ref 6.5–8.1)

## 2016-02-08 LAB — LIPASE, BLOOD: LIPASE: 24 U/L (ref 11–51)

## 2016-02-08 NOTE — ED Notes (Signed)
C/o hematuria, frequent urination, and dysuria x 1 week.  Also reports intermittent pain to c-section site x 5 years- states she has been seen here multiple times for same.

## 2016-02-09 ENCOUNTER — Emergency Department (HOSPITAL_COMMUNITY)
Admission: EM | Admit: 2016-02-09 | Discharge: 2016-02-09 | Disposition: A | Discharge status: Home / Self Care | Payer: Medicaid Other | Attending: Emergency Medicine | Admitting: Emergency Medicine

## 2016-02-09 DIAGNOSIS — L7682 Other postprocedural complications of skin and subcutaneous tissue: Secondary | ICD-10-CM

## 2016-02-09 DIAGNOSIS — R300 Dysuria: Secondary | ICD-10-CM

## 2016-02-09 LAB — PREGNANCY, URINE: PREG TEST UR: NEGATIVE

## 2016-02-09 MED ORDER — IBUPROFEN 200 MG PO TABS
600.0000 mg | ORAL_TABLET | Freq: Once | ORAL | Status: AC
  Administered 2016-02-09: 600 mg via ORAL
  Filled 2016-02-09: qty 3

## 2016-02-09 MED ORDER — CEPHALEXIN 500 MG PO CAPS: 500.0000 mg | ORAL_CAPSULE | Freq: Four times a day (QID) | ORAL | Status: DC

## 2016-02-09 MED ORDER — IBUPROFEN 600 MG PO TABS: 600.0000 mg | ORAL_TABLET | Freq: Three times a day (TID) | ORAL | Status: DC | PRN

## 2016-02-09 NOTE — Discharge Instructions (Signed)
It was our pleasure to provide your ER care today - we hope that you feel better.  Take keflex (antibiotic) as prescribed.  Take motrin as need for pain.  Follow up with primary care doctor in coming week.  Also follow up with ob/gyn doctor in the next 1-2 weeks.  Return to ER if worse, new symptoms, fevers, persistent vomiting, worsening/severe pain, other concern.    Dysuria Dysuria is pain or discomfort while urinating. The pain or discomfort may be felt in the tube that carries urine out of the bladder (urethra) or in the surrounding tissue of the genitals. The pain may also be felt in the groin area, lower abdomen, and lower back. You may have to urinate frequently or have the sudden feeling that you have to urinate (urgency). Dysuria can affect both men and women, but is more common in women. Dysuria can be caused by many different things, including:  Urinary tract infection in women.  Infection of the kidney or bladder.  Kidney stones or bladder stones.  Certain sexually transmitted infections (STIs), such as chlamydia.  Dehydration.  Inflammation of the vagina.  Use of certain medicines.  Use of certain soaps or scented products that cause irritation. HOME CARE INSTRUCTIONS Watch your dysuria for any changes. The following actions may help to reduce any discomfort you are feeling:  Drink enough fluid to keep your urine clear or pale yellow.  Empty your bladder often. Avoid holding urine for long periods of time.  After a bowel movement or urination, women should cleanse from front to back, using each tissue only once.  Empty your bladder after sexual intercourse.  Take medicines only as directed by your health care provider.  If you were prescribed an antibiotic medicine, finish it all even if you start to feel better.  Avoid caffeine, tea, and alcohol. They can irritate the bladder and make dysuria worse. In men, alcohol may irritate the prostate.  Keep all  follow-up visits as directed by your health care provider. This is important.  If you had any tests done to find the cause of dysuria, it is your responsibility to obtain your test results. Ask the lab or department performing the test when and how you will get your results. Talk with your health care provider if you have any questions about your results. SEEK MEDICAL CARE IF:  You develop pain in your back or sides.  You have a fever.  You have nausea or vomiting.  You have blood in your urine.  You are not urinating as often as you usually do. SEEK IMMEDIATE MEDICAL CARE IF:  You pain is severe and not relieved with medicines.  You are unable to hold down any fluids.  You or someone else notices a change in your mental function.  You have a rapid heartbeat at rest.  You have shaking or chills.  You feel extremely weak.   This information is not intended to replace advice given to you by your health care provider. Make sure you discuss any questions you have with your health care provider.   Document Released: 08/22/2004 Document Revised: 12/15/2014 Document Reviewed: 07/20/2014 Elsevier Interactive Patient Education 2016 [employer] Inc[employer]    Urinary Tract Infection A urinary tract infection (UTI) can occur any place along the urinary tract. The tract includes the kidneys, ureters, bladder, and urethra. A type of germ called bacteria often causes a UTI. UTIs are often helped with antibiotic medicine.  HOME CARE   If given, take antibiotics  as told by your doctor. Finish them even if you start to feel better.  Drink enough fluids to keep your pee (urine) clear or pale yellow.  Avoid tea, drinks with caffeine, and bubbly (carbonated) drinks.  Pee often. Avoid holding your pee in for a long time.  Pee before and after having sex (intercourse).  Wipe from front to back after you poop (bowel movement) if you are a woman. Use each tissue only once. GET HELP RIGHT AWAY IF:     You have back pain.  You have lower belly (abdominal) pain.  You have chills.  You feel sick to your stomach (nauseous).  You throw up (vomit).  Your burning or discomfort with peeing does not go away.  You have a fever.  Your symptoms are not better in 3 days. MAKE SURE YOU:   Understand these instructions.  Will watch your condition.  Will get help right away if you are not doing well or get worse.   This information is not intended to replace advice given to you by your health care provider. Make sure you discuss any questions you have with your health care provider.   Document Released: 05/12/2008 Document Revised: 12/15/2014 Document Reviewed: 06/24/2012 Elsevier Interactive Patient Education [employer]

## 2016-02-09 NOTE — ED Provider Notes (Signed)
CSN: [id number]     Arrival date & time 02/08/16  2218 History  By signing my name below, I, Veronica [name], attest that this documentation has been prepared under the direction and in the presence of Veronica Laine, MD. Electronically Signed: Doreatha [name], ED Scribe. 02/09/2016. 1:16 AM.    Chief Complaint  Patient presents with  . Hematuria   The history is provided by the patient. No language interpreter was used.    HPI Comments: Veronica [name] is a 25 y.o. female who presents to the Emergency Department complaining of moderate dysuria onset this week with associated diarrhea, back pain, and small amt blood in urine. She also complains of chronic intermittent pain above the site of a 25 yo C-section, which she has been seen for in the past. She states she has not followed up with her OB/GYN since the onset of her pain. She states she has previously tried Neurontin with no relief of this pain. Pt denies taking OTC medications at home to improve symptoms. Pt notes she has had an Implanon since 2015 and no longer has periods. She denies nausea, emesis, fever, CP, SOB, HA, rash.  Denies vaginal d/c or pain.   Past Medical History  Diagnosis Date  . GERD (gastroesophageal reflux disease)   . Obesity (BMI 30-39.9)   . Hiatal hernia   . Anemia   . Ankle pain    Past Surgical History  Procedure Laterality Date  . Cesarean section  09/02/2010    For macrosomia, but son was 7 lb 12 oz  . Tonsillectomy and adenoidectomy     Family History  Problem Relation Age of Onset  . Diabetes Mother   . Hypertension Mother   . Obesity Mother   . Obesity Sister   . Osteochondroma Sister   . Diabetes Sister   . Hypertension Sister    Social History  Substance Use Topics  . Smoking status: Never Smoker   . Smokeless tobacco: Never Used  . Alcohol Use: No   OB History    No data available     Review of Systems  Constitutional: Negative for fever and chills.  HENT: Negative for sore throat.   Eyes:  Negative for redness.  Respiratory: Negative for shortness of breath.   Cardiovascular: Negative for chest pain and leg swelling.  Gastrointestinal: Positive for diarrhea. Negative for vomiting and abdominal pain.  Endocrine: Negative for polyuria.  Genitourinary: Positive for dysuria and hematuria. Negative for vaginal discharge.  Musculoskeletal: Positive for back pain. Negative for neck pain.  Skin: Negative for rash.  Neurological: Negative for headaches.  Hematological: Does not bruise/bleed easily.  Psychiatric/Behavioral: Negative for confusion.   Allergies  Morphine and related  Home Medications   Prior to Admission medications   Medication Sig Start Date End Date Taking? Authorizing Provider  esomeprazole (NEXIUM) 40 MG capsule Take 40 mg by mouth daily at 12 noon.   Yes Historical Provider, MD  etonogestrel (NEXPLANON) 68 MG IMPL implant 1 each by Subdermal route once.   Yes Historical Provider, MD  Fe Fum-FePoly-FA-Vit C-Vit B3 (INTEGRA F) 125-1 MG CAPS Take 1 capsule by mouth daily. 07/30/15  Yes [name], MD   BP 130/89 mmHg  Pulse 79  Temp(Src) 98.1 F (36.7 C) (Oral)  Resp 16  Ht  (1.575 m)  Wt 283 lb (128.368 kg)  BMI 51.75 kg/m2  SpO2 98% Physical Exam  Constitutional: She is oriented to person, place, and time. She appears well-developed and  well-nourished. No distress.  HENT:  Head: Normocephalic and atraumatic.  Eyes: Conjunctivae and EOM are normal. No scleral icterus.  Neck: Normal range of motion. Neck supple.  Cardiovascular: Normal rate.   Pulmonary/Chest: Effort normal. No respiratory distress.  Abdominal: Soft. Bowel sounds are normal. She exhibits no distension and no mass. There is no tenderness. There is no rebound and no guarding.  Healed incision, no incarc hernia. No sts. No erythema.   Genitourinary:  No cva tenderness.   Musculoskeletal: Normal range of motion. She exhibits no edema.  Neurological: She is alert and  oriented to person, place, and time.  Skin: Skin is warm and dry. No rash noted. She is not diaphoretic.  Psychiatric: She has a normal mood and affect. Her behavior is normal.  Nursing note and vitals reviewed.   ED Course  Procedures (including critical care time) DIAGNOSTIC STUDIES: Oxygen Saturation is 98% on RA, normal by my interpretation.    COORDINATION OF CARE: 1:13 AM Discussed treatment plan with pt at bedside which includes lab work and pt agreed to plan.    Labs Review   Results for orders placed or performed during the hospital encounter of 02/09/16  Lipase, blood  Result Value Ref Range   Lipase 24 11 - 51 U/L  Comprehensive metabolic panel  Result Value Ref Range   Sodium 138 135 - 145 mmol/L   Potassium 3.7 3.5 - 5.1 mmol/L   Chloride 101 101 - 111 mmol/L   CO2 25 22 - 32 mmol/L   Glucose, Bld 100 (H) 65 - 99 mg/dL   BUN 11 6 - 20 mg/dL   Creatinine, Ser 4.091.03 (H) 0.44 - 1.00 mg/dL   Calcium 8.8 (L) 8.9 - 10.3 mg/dL   Total Protein 7.2 6.5 - 8.1 g/dL   Albumin 3.5 3.5 - 5.0 g/dL   AST 11 (L) 15 - 41 U/L   ALT 12 (L) 14 - 54 U/L   Alkaline Phosphatase 56 38 - 126 U/L   Total Bilirubin 0.5 0.3 - 1.2 mg/dL   GFR calc non Af Amer >60 >60 mL/min   GFR calc Af Amer >60 >60 mL/min   Anion gap 12 5 - 15  CBC  Result Value Ref Range   WBC 8.9 4.0 - 10.5 K/uL   RBC 4.28 3.87 - 5.11 MIL/uL   Hemoglobin 11.6 (L) 12.0 - 15.0 g/dL   HCT 81.135.5 (L) 91.436.0 - 78.246.0 %   MCV 82.9 78.0 - 100.0 fL   MCH 27.1 26.0 - 34.0 pg   MCHC 32.7 30.0 - 36.0 g/dL   RDW 95.614.6 21.311.5 - 08.615.5 %   Platelets 344 150 - 400 K/uL  Urinalysis, Routine w reflex microscopic (not at [facility])  Result Value Ref Range   Color, Urine YELLOW YELLOW   APPearance CLOUDY (A) CLEAR   Specific Gravity, Urine 1.024 1.005 - 1.030   pH 6.0 5.0 - 8.0   Glucose, UA NEGATIVE NEGATIVE mg/dL   Hgb urine dipstick LARGE (A) NEGATIVE   Bilirubin Urine NEGATIVE NEGATIVE   Ketones, ur NEGATIVE NEGATIVE mg/dL   Protein,  ur 30 (A) NEGATIVE mg/dL   Nitrite NEGATIVE NEGATIVE   Leukocytes, UA SMALL (A) NEGATIVE  Urine microscopic-add on  Result Value Ref Range   Squamous Epithelial / LPF 0-5 (A) NONE SEEN   WBC, UA 0-5 0 - 5 WBC/hpf   RBC / HPF 6-30 0 - 5 RBC/hpf   Bacteria, UA RARE (A) NONE SEEN  Pregnancy, urine  Result Value Ref Range   Preg Test, Ur NEGATIVE NEGATIVE     I have personally reviewed and evaluated these lab results as part of my medical decision-making.   MDM   I personally performed the services described in this documentation, which was scribed in my presence. The recorded information has been reviewed and considered. Veronica Laine, MD   Pt c/o dysuria, urgency.  Small LE.  Will rx for possible uti.  pts c/o pain in/around  csection incision x 5 years. No acute or abrupt change tonight or this week.   No erythema or swelling to area. Discussed diff dx incl possible scar tissue, pain related to sutures, nerve impingement/inj, etc.  Will give rx motrin. rec f/u w her md.  Pt requests referral to new gyn doctor - will provide.  Pt afeb. Abd/pelv soft nt.  Pt currently appears stable for d/c.       Veronica Laine, MD 02/09/16 [phone]

## 2016-02-25 ENCOUNTER — Emergency Department (HOSPITAL_COMMUNITY)
Admission: EM | Admit: 2016-02-25 | Discharge: 2016-02-26 | Disposition: A | Discharge status: Home / Self Care | Payer: Medicaid Other | Attending: Emergency Medicine | Admitting: Emergency Medicine

## 2016-02-25 ENCOUNTER — Encounter (HOSPITAL_COMMUNITY): Payer: Self-pay | Admitting: *Deleted

## 2016-02-25 DIAGNOSIS — K219 Gastro-esophageal reflux disease without esophagitis: Secondary | ICD-10-CM | POA: Insufficient documentation

## 2016-02-25 DIAGNOSIS — R5383 Other fatigue: Secondary | ICD-10-CM | POA: Insufficient documentation

## 2016-02-25 DIAGNOSIS — R05 Cough: Secondary | ICD-10-CM | POA: Insufficient documentation

## 2016-02-25 DIAGNOSIS — R509 Fever, unspecified: Secondary | ICD-10-CM | POA: Insufficient documentation

## 2016-02-25 DIAGNOSIS — M791 Myalgia: Secondary | ICD-10-CM | POA: Insufficient documentation

## 2016-02-25 DIAGNOSIS — J029 Acute pharyngitis, unspecified: Secondary | ICD-10-CM | POA: Insufficient documentation

## 2016-02-25 DIAGNOSIS — R0981 Nasal congestion: Secondary | ICD-10-CM | POA: Insufficient documentation

## 2016-02-25 DIAGNOSIS — D649 Anemia, unspecified: Secondary | ICD-10-CM | POA: Insufficient documentation

## 2016-02-25 DIAGNOSIS — R110 Nausea: Secondary | ICD-10-CM | POA: Insufficient documentation

## 2016-02-25 DIAGNOSIS — J3489 Other specified disorders of nose and nasal sinuses: Secondary | ICD-10-CM | POA: Insufficient documentation

## 2016-02-25 DIAGNOSIS — R51 Headache: Secondary | ICD-10-CM | POA: Insufficient documentation

## 2016-02-25 DIAGNOSIS — Z79899 Other long term (current) drug therapy: Secondary | ICD-10-CM | POA: Insufficient documentation

## 2016-02-25 DIAGNOSIS — E669 Obesity, unspecified: Secondary | ICD-10-CM | POA: Insufficient documentation

## 2016-02-25 DIAGNOSIS — Z792 Long term (current) use of antibiotics: Secondary | ICD-10-CM | POA: Insufficient documentation

## 2016-02-25 DIAGNOSIS — R6889 Other general symptoms and signs: Secondary | ICD-10-CM

## 2016-02-25 NOTE — ED Notes (Signed)
Pt c/o headache, nausea and sore throat x 2 days; pt c/o generalized malaise; pt c/o bilateral ear pain

## 2016-02-26 MED ORDER — IBUPROFEN 200 MG PO TABS
600.0000 mg | ORAL_TABLET | Freq: Once | ORAL | Status: AC
  Administered 2016-02-26: 600 mg via ORAL
  Filled 2016-02-26: qty 3

## 2016-02-26 NOTE — ED Notes (Signed)
Pt reports understanding of discharge information. No questions at time of discharge 

## 2016-02-26 NOTE — Discharge Instructions (Signed)
Take alternating does of Tylenol/Motirn for fever or body aches, get plenty of rest, drink lots of fluids   Influenza, Adult Influenza ("the flu") is a viral infection of the respiratory tract. It occurs more often in winter months because people spend more time in close contact with one another. Influenza can make you feel very sick. Influenza easily spreads from person to person (contagious). CAUSES  Influenza is caused by a virus that infects the respiratory tract. You can catch the virus by breathing in droplets from an infected person's cough or sneeze. You can also catch the virus by touching something that was recently contaminated with the virus and then touching your mouth, nose, or eyes. RISKS AND COMPLICATIONS You may be at risk for a more severe case of influenza if you smoke cigarettes, have diabetes, have chronic heart disease (such as heart failure) or lung disease (such as asthma), or if you have a weakened immune system. Elderly people and pregnant women are also at risk for more serious infections. The most common problem of influenza is a lung infection (pneumonia). Sometimes, this problem can require emergency medical care and may be life threatening. SIGNS AND SYMPTOMS  Symptoms typically last 4 to 10 days and may include:  Fever.  Chills.  Headache, body aches, and muscle aches.  Sore throat.  Chest discomfort and cough.  Poor appetite.  Weakness or feeling tired.  Dizziness.  Nausea or vomiting. DIAGNOSIS  Diagnosis of influenza is often made based on your history and a physical exam. A nose or throat swab test can be done to confirm the diagnosis. TREATMENT  In mild cases, influenza goes away on its own. Treatment is directed at relieving symptoms. For more severe cases, your health care provider may prescribe antiviral medicines to shorten the sickness. Antibiotic medicines are not effective because the infection is caused by a virus, not by bacteria. HOME  CARE INSTRUCTIONS  Take medicines only as directed by your health care provider.  Use a cool mist humidifier to make breathing easier.  Get plenty of rest until your temperature returns to normal. This usually takes 3 to 4 days.  Drink enough fluid to keep your urine clear or pale yellow.  Cover yourmouth and nosewhen coughing or sneezing,and wash your handswellto prevent thevirusfrom spreading.  Stay homefromwork orschool untilthe fever is gonefor at least 581full day. PREVENTION  An annual influenza vaccination (flu shot) is the best way to avoid getting influenza. An annual flu shot is now routinely recommended for all adults in the [place] SEEK MEDICAL CARE IF:  You experiencechest pain, yourcough worsens,or you producemore mucus.  Youhave nausea,vomiting, ordiarrhea.  Your fever returns or gets worse. SEEK IMMEDIATE MEDICAL CARE IF:  You havetrouble breathing, you become short of breath,or your skin ornails becomebluish.  You have severe painor stiffnessin the neck.  You develop a sudden headache, or pain in the face or ear.  You have nausea or vomiting that you cannot control. MAKE SURE YOU:   Understand these instructions.  Will watch your condition.  Will get help right away if you are not doing well or get worse.   This information is not intended to replace advice given to you by your health care provider. Make sure you discuss any questions you have with your health care provider.   Document Released: 11/21/2000 Document Revised: 12/15/2014 Document Reviewed: 02/23/2012 Elsevier Interactive Patient Education [employer]2016 [employer] Inc.

## 2016-02-26 NOTE — ED Provider Notes (Signed)
CSN: [id number]     Arrival date & time 02/25/16  2346 History   First MD Initiated Contact with Patient 02/26/16 0050     Chief Complaint  Patient presents with  . URI     (Consider location/radiation/quality/duration/timing/severity/associated sxs/prior Treatment) HPI Comments: Patient with sudden onset constellation of symptoms including Mylagia, headache, cough,, sore throat, body aches and nausea.  Took 1 dose of Advil 24 hours ago none since  Patient is a 25 y.o. female presenting with URI. The history is provided by the patient.  URI Presenting symptoms: congestion, cough, fatigue, fever, rhinorrhea and sore throat   Severity:  Moderate Onset quality:  Sudden Duration:  1 day Timing:  Constant Progression:  Unchanged Chronicity:  New Relieved by:  Nothing Worsened by:  Nothing tried Ineffective treatments:  None tried Associated symptoms: headaches and myalgias   Associated symptoms: no wheezing   Headaches:    Severity:  Mild   Onset quality:  Sudden   Duration:  1 day   Timing:  Constant   Progression:  Unchanged Myalgias:    Location:  Generalized   Quality:  Dull   Severity:  Moderate   Onset quality:  Sudden   Past Medical History  Diagnosis Date  . GERD (gastroesophageal reflux disease)   . Obesity (BMI 30-39.9)   . Hiatal hernia   . Anemia   . Ankle pain    Past Surgical History  Procedure Laterality Date  . Cesarean section  09/02/2010    For macrosomia, but son was 7 lb 12 oz  . Tonsillectomy and adenoidectomy     Family History  Problem Relation Age of Onset  . Diabetes Mother   . Hypertension Mother   . Obesity Mother   . Obesity Sister   . Osteochondroma Sister   . Diabetes Sister   . Hypertension Sister    Social History  Substance Use Topics  . Smoking status: Never Smoker   . Smokeless tobacco: Never Used  . Alcohol Use: No   OB History    No data available     Review of Systems  Constitutional: Positive for fever and  fatigue.  HENT: Positive for congestion, rhinorrhea and sore throat.   Respiratory: Positive for cough. Negative for shortness of breath and wheezing.   Gastrointestinal: Positive for nausea. Negative for vomiting.  Musculoskeletal: Positive for myalgias.  Skin: Negative for rash.  Neurological: Positive for headaches. Negative for dizziness.  All other systems reviewed and are negative.     Allergies  Morphine and related  Home Medications   Prior to Admission medications   Medication Sig Start Date End Date Taking? Authorizing Provider  cephALEXin (KEFLEX) 500 MG capsule Take 1 capsule (500 mg total) by mouth 4 (four) times daily. 02/09/16   Cathren LaineKevin Steinl, MD  esomeprazole (NEXIUM) 40 MG capsule Take 40 mg by mouth daily at 12 noon.    Historical Provider, MD  etonogestrel (NEXPLANON) 68 MG IMPL implant 1 each by Subdermal route once.    Historical Provider, MD  Fe Fum-FePoly-FA-Vit C-Vit B3 (INTEGRA F) 125-1 MG CAPS Take 1 capsule by mouth daily. 07/30/15   Asiyah Mayra ReelZahra Mikell, MD  ibuprofen (ADVIL,MOTRIN) 600 MG tablet Take 1 tablet (600 mg total) by mouth every 8 (eight) hours as needed. Take with food. 02/09/16   Cathren LaineKevin Steinl, MD   BP 109/72 mmHg  Pulse 87  Temp(Src) 98.1 F (36.7 C) (Oral)  Resp 18  SpO2 97% Physical Exam  Constitutional: She appears well-developed  and well-nourished.  HENT:  Head: Normocephalic.  Eyes: Pupils are equal, round, and reactive to light.  Neck: Normal range of motion.  Cardiovascular: Normal rate and regular rhythm.   Pulmonary/Chest: Effort normal and breath sounds normal.  Abdominal: Soft.  Musculoskeletal: Normal range of motion.  Neurological: She is alert.  Skin: Skin is warm.  Nursing note and vitals reviewed.   ED Course  Procedures (including critical care time) Labs Review Labs Reviewed - No data to display  Imaging Review No results found. I have personally reviewed and evaluated these images and lab results as part of my  medical decision-making.   EKG Interpretation None    Presumed Flu instructed to alternate Tylenol/motin rest drink plenty of fluids   MDM   Final diagnoses:  Flu-like symptoms         Earley Favor, NP 02/26/16 0126  Derwood Kaplan, MD 02/27/16 1610

## 2016-02-27 ENCOUNTER — Ambulatory Visit (INDEPENDENT_AMBULATORY_CARE_PROVIDER_SITE_OTHER): Payer: Self-pay | Admitting: Family Medicine

## 2016-02-27 ENCOUNTER — Encounter: Payer: Self-pay | Admitting: Family Medicine

## 2016-02-27 VITALS — BP 125/78 | HR 85 | Temp 97.8°F | Wt 281.4 lb

## 2016-02-27 DIAGNOSIS — J028 Acute pharyngitis due to other specified organisms: Principal | ICD-10-CM

## 2016-02-27 DIAGNOSIS — B9789 Other viral agents as the cause of diseases classified elsewhere: Secondary | ICD-10-CM

## 2016-02-27 DIAGNOSIS — J069 Acute upper respiratory infection, unspecified: Secondary | ICD-10-CM

## 2016-02-27 DIAGNOSIS — J029 Acute pharyngitis, unspecified: Secondary | ICD-10-CM

## 2016-02-27 NOTE — Progress Notes (Signed)
   Subjective:    Patient ID: Veronica [name], female    DOB: 08/28/1991, 25 y.o.   MRN: [id number]  HPI  Patient presents for Same Day Appointment  CC: cold  # Cold symptoms:  Started about 3 days ago  Some sinus pressure, cough  Started with nausea, throat, fever, ear pain -- went to ED and was diagnosed with flu  Does feel like she is getting better  Has been taking advil  Ear pain still there but better  Social Hx: never msoker  Review of Systems   See HPI for ROS.   Past medical history, surgical, family, and social history reviewed and updated in the EMR as appropriate.  Objective:  BP 125/78 mmHg  Pulse 85  Temp(Src) 97.8 F (36.6 C) (Oral)  Wt 281 lb 6.4 oz (127.642 kg)  SpO2 96% Vitals and nursing note reviewed  General: no apparent distress  HEENT: mild rhinorrhea. Normal conjunctiva and sclera. Moist mucous membranes. No posterior pharyngeal exudates. Both TM are pearly gray bilaterally without effusion.  CV: normal rate, regular rhythm, no murmurs, rubs or gallop.  Resp: clear to auscultation bilaterally, normal effort  Assessment & Plan:   1. Sore throat (viral) Viral URI. Continue conservative treatments, OTCs, hand hygiene,fu if not improving over next week.

## 2016-02-27 NOTE — Patient Instructions (Addendum)
Your symptoms are due to a viral illness. Antibiotics will not help improve your symptoms, but the following will help you feel better while your body fights the virus.   Stay hydrated - drink a lot of water   Nasal Saline Spray  Congestion:   Nose spray: Afrin (Phenylephrine). DO NOT USE MORE THAN 3 DAYS  Oral: Pseudoephedrine  Sneezing & Runny nose: Cetirizine (Zyrtec), Fexofenadine (Allegra), Loratadine (Claritin)  Pain/Sore throat: Tylenol, Ibuprofen  Cough: Dextromethorphan, (Guaifenesin, "Mucinex") & Albuterol  Wash your hands often to prevent spreading the virus  Your Nexplanon will expire on May 2015

## 2016-03-03 ENCOUNTER — Other Ambulatory Visit (HOSPITAL_COMMUNITY)
Admission: RE | Admit: 2016-03-03 | Discharge: 2016-03-03 | Disposition: A | Discharge status: Home / Self Care | Payer: Medicaid Other | Source: Ambulatory Visit | Attending: Family Medicine | Admitting: Family Medicine

## 2016-03-03 ENCOUNTER — Encounter: Payer: Self-pay | Admitting: Family Medicine

## 2016-03-03 ENCOUNTER — Ambulatory Visit (INDEPENDENT_AMBULATORY_CARE_PROVIDER_SITE_OTHER): Payer: Self-pay | Admitting: Family Medicine

## 2016-03-03 VITALS — BP 104/62 | HR 72 | Temp 98.6°F | Ht 62.0 in | Wt 278.0 lb

## 2016-03-03 DIAGNOSIS — B379 Candidiasis, unspecified: Secondary | ICD-10-CM

## 2016-03-03 DIAGNOSIS — L298 Other pruritus: Secondary | ICD-10-CM

## 2016-03-03 DIAGNOSIS — N898 Other specified noninflammatory disorders of vagina: Secondary | ICD-10-CM

## 2016-03-03 DIAGNOSIS — R7309 Other abnormal glucose: Secondary | ICD-10-CM

## 2016-03-03 DIAGNOSIS — Z113 Encounter for screening for infections with a predominantly sexual mode of transmission: Secondary | ICD-10-CM | POA: Insufficient documentation

## 2016-03-03 DIAGNOSIS — Z20828 Contact with and (suspected) exposure to other viral communicable diseases: Secondary | ICD-10-CM

## 2016-03-03 DIAGNOSIS — Z202 Contact with and (suspected) exposure to infections with a predominantly sexual mode of transmission: Secondary | ICD-10-CM

## 2016-03-03 LAB — POCT WET PREP (WET MOUNT): CLUE CELLS WET PREP WHIFF POC: NEGATIVE

## 2016-03-03 LAB — POCT GLYCOSYLATED HEMOGLOBIN (HGB A1C): HEMOGLOBIN A1C: 5.4

## 2016-03-03 MED ORDER — FLUCONAZOLE 150 MG PO TABS: 150.0000 mg | ORAL_TABLET | Freq: Once | ORAL | Status: DC

## 2016-03-03 NOTE — Addendum Note (Signed)
Addended by: Jamal CollinJOYNER, Brick Ketcher R on: 03/03/2016 06:05 PM   Modules accepted: Orders

## 2016-03-03 NOTE — Progress Notes (Signed)
   Subjective:    Patient ID: Veronica [name], female    DOB: 01/10/1991, 25 y.o.   MRN: [id number]  Seen for Same day visit for   CC: vaginal discharge  VAGINAL DISCHARGE  Having vaginal discharge for 3 days. Medications tried: none Discharge consistency: thick Discharge color: clear Recent antibiotic use: yes Sex in last month: yes Possible STD exposure:yes  Symptoms Fever: no Dysuria:no Vaginal bleeding: no Abdomen or Pelvic pain: no Back pain: no Genital sores or ulcers:no Rash: no Pain during sex: no Missed menstrual period: yes - Has Nexplanon  ROS see HPI Smoking Status noted  Objective:  BP 104/62 mmHg  Pulse 72  Temp(Src) 98.6 F (37 C) (Oral)  Ht 5\' 2"  (1.575 m)  Wt 278 lb (126.1 kg)  BMI 50.83 kg/m2  SpO2 97%  General: NAD Speculum Exam: Ext genitalia: wnl; Vaginal discharge: thick white; Cervix: wnl    Assessment & Plan:   1. Vaginal itching - POCT Wet Prep [facility]([place]et [place]) - Cervicovaginal ancillary only   2. Contact with or exposure to viral disease - Cervicovaginal ancillary only - HIV antibody (with reflex)  3. Contact with or exposure to venereal disease - Cervicovaginal ancillary only - RPR  4. Elevated glucose - POCT glycosylated hemoglobin (Hb A1C)  5. Yeast infection - fluconazole (DIFLUCAN) 150 MG tablet; Take 1 tablet (150 mg total) by mouth once.  Dispense: 1 tablet; Refill:

## 2016-03-04 ENCOUNTER — Encounter: Payer: Self-pay | Admitting: Family Medicine

## 2016-03-04 LAB — HIV ANTIBODY (ROUTINE TESTING W REFLEX): HIV: NONREACTIVE

## 2016-03-04 LAB — RPR

## 2016-03-05 LAB — CERVICOVAGINAL ANCILLARY ONLY
CHLAMYDIA, DNA PROBE: NEGATIVE
Neisseria Gonorrhea: NEGATIVE

## 2016-03-07 ENCOUNTER — Encounter: Payer: Self-pay | Admitting: Family Medicine

## 2016-03-07 ENCOUNTER — Ambulatory Visit (INDEPENDENT_AMBULATORY_CARE_PROVIDER_SITE_OTHER): Payer: Medicaid Other | Admitting: Family Medicine

## 2016-03-07 VITALS — BP 128/66 | HR 68 | Temp 97.6°F | Ht 62.0 in | Wt 281.5 lb

## 2016-03-07 DIAGNOSIS — N760 Acute vaginitis: Secondary | ICD-10-CM

## 2016-03-07 DIAGNOSIS — B373 Candidiasis of vulva and vagina: Secondary | ICD-10-CM | POA: Diagnosis not present

## 2016-03-07 DIAGNOSIS — B9689 Other specified bacterial agents as the cause of diseases classified elsewhere: Secondary | ICD-10-CM

## 2016-03-07 DIAGNOSIS — A499 Bacterial infection, unspecified: Secondary | ICD-10-CM

## 2016-03-07 DIAGNOSIS — B3731 Acute candidiasis of vulva and vagina: Secondary | ICD-10-CM

## 2016-03-07 MED ORDER — FLUCONAZOLE 150 MG PO TABS: ORAL_TABLET | ORAL | Status: DC

## 2016-03-07 NOTE — Progress Notes (Signed)
   Subjective:    Patient ID: Veronica [name], female    DOB: [birth year], 25 y.o.   MRN: [id number]  HPI 25 y/o female presents for follow up of vaginal irritation.  Vaginal Irritation - seen on 3/27 by Dr. Gayla DossJoyner, diagnosed with yeast infection, took Diflucan X1, still has symptoms. Vaginal itching, some brownish discharge. No dysuria/hematuria. No abdominal pain.   Nexplanon placed, last period was 2015   Review of Systems See HPI.     Objective:   Physical Exam BP 128/66 mmHg  Pulse 68  Temp(Src) 97.6 F (36.4 C) (Oral)  Ht 5\' 2"  (1.575 m)  Wt 281 lb 8 oz (127.688 kg)  BMI 51.47 kg/m2  Gen: pleasant female, NAD Cardiac: RRR, S1 and S2 present, no murmur Resp: CTB, normal effort Abd: soft, no tenderness, normal bowel sounds   3/27  Wet Prep - mod bacteria, few yeast, otherwise negative RPR nonreactive HIV nonreactive Negative for Chlamydia and Gonorrhea  3/3 UA large Hbg, small LE, negative nitrite Treated for UTI at that time.    Assessment & Plan:  Vaginal yeast infection Patient presents for follow up of vaginal yeast infection. Symptoms remain after treatment with Diflucan X1. -Refill of Diflucan provided. Take 150 today and repeat in 3 days.

## 2016-03-07 NOTE — Assessment & Plan Note (Signed)
Patient presents for follow up of vaginal yeast infection. Symptoms remain after treatment with Diflucan X1. -Refill of Diflucan provided. Take 150 today and repeat in 3 days.

## 2016-03-07 NOTE — Patient Instructions (Signed)
It was nice to meet you today.  I think your symptoms are related to a yeast infection.  Take Diflucan 150 mg today and repeat in 3 days (april 3).  If you continue to have symptoms please call the office, I will not make you schedule a new appointment.

## 2016-05-09 ENCOUNTER — Encounter: Payer: Self-pay | Admitting: Internal Medicine

## 2016-05-09 ENCOUNTER — Ambulatory Visit (INDEPENDENT_AMBULATORY_CARE_PROVIDER_SITE_OTHER): Payer: Self-pay | Admitting: Internal Medicine

## 2016-05-09 VITALS — BP 120/63 | HR 85 | Temp 98.1°F | Wt 281.6 lb

## 2016-05-09 DIAGNOSIS — R5383 Other fatigue: Secondary | ICD-10-CM | POA: Insufficient documentation

## 2016-05-09 DIAGNOSIS — M792 Neuralgia and neuritis, unspecified: Secondary | ICD-10-CM

## 2016-05-09 DIAGNOSIS — R3589 Other polyuria: Secondary | ICD-10-CM | POA: Insufficient documentation

## 2016-05-09 DIAGNOSIS — R358 Other polyuria: Secondary | ICD-10-CM

## 2016-05-09 LAB — CBC
HCT: 36.5 % (ref 35.0–45.0)
Hemoglobin: 12 g/dL (ref 11.7–15.5)
MCH: 27.4 pg (ref 27.0–33.0)
MCHC: 32.9 g/dL (ref 32.0–36.0)
MCV: 83.3 fL (ref 80.0–100.0)
MPV: 9.8 fL (ref 7.5–12.5)
PLATELETS: 369 10*3/uL (ref 140–400)
RBC: 4.38 MIL/uL (ref 3.80–5.10)
RDW: 14.6 % (ref 11.0–15.0)
WBC: 9 10*3/uL (ref 3.8–10.8)

## 2016-05-09 LAB — POCT GLYCOSYLATED HEMOGLOBIN (HGB A1C): HEMOGLOBIN A1C: 5.3

## 2016-05-09 LAB — TSH: TSH: 2.49 m[IU]/L

## 2016-05-09 MED ORDER — GABAPENTIN 100 MG PO CAPS: 100.0000 mg | ORAL_CAPSULE | Freq: Three times a day (TID) | ORAL | Status: DC

## 2016-05-09 NOTE — Patient Instructions (Signed)
-   You can try the gabapentin for nerve pain due to C-section but may  cause fatigue. Consider improving diet and exercising for the fatigue. Continue taking the iron.

## 2016-05-09 NOTE — Progress Notes (Signed)
   [facility] Veronica CharsAsiyah Shakina Choy, MD Phone: [phone]  Reason For Visit: Fatigue and Nerve Pain   # Nerve Pain from C-section Scar - Indicates chronic intermittent pain near site of 25 yo C-section. She states she has been having this pain for 5 years and it has not improved with time. The pain is sharp stingy pain. Exacerbated by lying on her side and by wearing tight pants. Indicates taking Ibuprofen has not really helped the pain.   # Fatigue- Hx of Anemia. Taking Iron. States that she gets 8 hours of sleep a night, going to bed at 10 pm getting up a 6 am to take her child to pre-K. In the afternoon, she will often take a nap sleeping for another 3-4 hours. She does not work. She has not been exercising. She drinks a lot of sweet tea and eats many sweets every day. She sometimes feels sad, but not every day. She indicates having some cold intolerance, she does not menstruate ( due to nexplanon), and she feels she had gained weight. She also endsores polyuria and polydipsia. No fever or chills.   Past Medical History Reviewed problem list.  Medications- reviewed and updated No additions to family history Social history- patient is a non -smoker  Objective: BP 120/63 mmHg  Pulse 85  Temp(Src) 98.1 F (36.7 C) (Oral)  Wt 281 lb 9.6 oz (127.733 kg) Gen: NAD, alert, cooperative with exam Neck: FROM, supple, no thyromegaly, no lymphadenopathy  CV: RRR, good S1/S2, no murmur, Resp: CTABL, no wheezes, non-labored Abd: SNTND, BS present,  Ext: No edema, warm,  Assessment/Plan: See problem based a/p  Fatigue General Hx of Fatigue. Medical causes explored, patient endorsed cold intolerance, weight gain, fatigue. No goiter present on exam. Present of exam acanthosis nigricans, patient endorsing polyuria/polydispia Patient with hx of anemia on iron supplement. Blood work to explore these endorsed symptoms included TSH, CBC, and HA1C. Non-medical causes explored include  sleep hygiene. Recommend to patient that she does not oversleep, also encouraged  Exercise and diet changes. - Consider PHQ9 at next visit - Follow up in  1 month   Neuropathic pain 5 years burning stingy pain near site of C-section. Ibuprofen not improving pain - Gabapentin 100 mg TID if it helps

## 2016-05-10 DIAGNOSIS — M792 Neuralgia and neuritis, unspecified: Secondary | ICD-10-CM | POA: Insufficient documentation

## 2016-05-10 NOTE — Assessment & Plan Note (Signed)
General Hx of Fatigue. Medical causes explored, patient endorsed cold intolerance, weight gain, fatigue. No goiter present on exam. Present of exam acanthosis nigricans, patient endorsing polyuria/polydispia Patient with hx of anemia on iron supplement. Blood work to explore these endorsed symptoms included TSH, CBC, and HA1C. Non-medical causes explored include sleep hygiene. Recommend to patient that she does not oversleep, also encouraged  Exercise and diet changes. - Consider PHQ9 at next visit - Follow up in  1 month

## 2016-05-10 NOTE — Assessment & Plan Note (Signed)
5 years burning stingy pain near site of C-section. Ibuprofen not improving pain - Gabapentin 100 mg TID if it helps

## 2016-05-12 ENCOUNTER — Other Ambulatory Visit: Payer: Self-pay | Admitting: *Deleted

## 2016-05-12 DIAGNOSIS — D649 Anemia, unspecified: Secondary | ICD-10-CM

## 2016-05-12 MED ORDER — INTEGRA F 125-1 MG PO CAPS: 1.0000 | ORAL_CAPSULE | Freq: Every day | ORAL | Status: DC

## 2016-05-12 NOTE — Telephone Encounter (Signed)
Pt needs refill. Hadas Jessop Bruna PotterBlount, CMA

## 2016-05-14 ENCOUNTER — Telehealth: Payer: Self-pay | Admitting: Internal Medicine

## 2016-05-14 NOTE — Telephone Encounter (Signed)
Discussed results with patient. Patient would like to come in to discuss weight loss. Encouraged her to do this.

## 2016-07-11 ENCOUNTER — Other Ambulatory Visit: Payer: Self-pay | Admitting: Internal Medicine

## 2016-07-11 MED ORDER — GABAPENTIN 100 MG PO CAPS: 100.0000 mg | ORAL_CAPSULE | Freq: Three times a day (TID) | ORAL | 0 refills | Status: DC

## 2016-07-11 NOTE — Telephone Encounter (Signed)
Pt is calling for a refill on her Gabapentin. She  Would like this called in to her pharmacy today since she is in a lot of pain. jw

## 2016-07-12 ENCOUNTER — Encounter (HOSPITAL_COMMUNITY): Payer: Self-pay

## 2016-07-12 ENCOUNTER — Emergency Department (HOSPITAL_COMMUNITY)
Admission: EM | Admit: 2016-07-12 | Discharge: 2016-07-12 | Disposition: A | Discharge status: Home / Self Care | Payer: Medicaid Other | Attending: Emergency Medicine | Admitting: Emergency Medicine

## 2016-07-12 DIAGNOSIS — R102 Pelvic and perineal pain: Secondary | ICD-10-CM | POA: Insufficient documentation

## 2016-07-12 DIAGNOSIS — R1030 Lower abdominal pain, unspecified: Secondary | ICD-10-CM | POA: Insufficient documentation

## 2016-07-12 DIAGNOSIS — R109 Unspecified abdominal pain: Secondary | ICD-10-CM

## 2016-07-12 LAB — COMPREHENSIVE METABOLIC PANEL
ALBUMIN: 3.5 g/dL (ref 3.5–5.0)
ALT: 13 U/L — ABNORMAL LOW (ref 14–54)
ANION GAP: 5 (ref 5–15)
AST: 11 U/L — AB (ref 15–41)
Alkaline Phosphatase: 63 U/L (ref 38–126)
BILIRUBIN TOTAL: 0.3 mg/dL (ref 0.3–1.2)
BUN: 8 mg/dL (ref 6–20)
CO2: 22 mmol/L (ref 22–32)
Calcium: 8.6 mg/dL — ABNORMAL LOW (ref 8.9–10.3)
Chloride: 109 mmol/L (ref 101–111)
Creatinine, Ser: 0.82 mg/dL (ref 0.44–1.00)
GFR calc Af Amer: >60 mL/min (ref >60)
GFR calc non Af Amer: >60 mL/min (ref >60)
GLUCOSE: 91 mg/dL (ref 65–99)
POTASSIUM: 3.7 mmol/L (ref 3.5–5.1)
SODIUM: 136 mmol/L (ref 135–145)
TOTAL PROTEIN: 7 g/dL (ref 6.5–8.1)

## 2016-07-12 LAB — I-STAT BETA HCG BLOOD, ED (MC, WL, AP ONLY)

## 2016-07-12 LAB — URINE MICROSCOPIC-ADD ON

## 2016-07-12 LAB — URINALYSIS, ROUTINE W REFLEX MICROSCOPIC
Bilirubin Urine: NEGATIVE
Glucose, UA: NEGATIVE mg/dL
Ketones, ur: NEGATIVE mg/dL
LEUKOCYTES UA: NEGATIVE
NITRITE: NEGATIVE
PROTEIN: NEGATIVE mg/dL
SPECIFIC GRAVITY, URINE: 1.019 (ref 1.005–1.030)
pH: 6.5 (ref 5.0–8.0)

## 2016-07-12 LAB — CBC
HCT: 38.5 % (ref 36.0–46.0)
HEMOGLOBIN: 12.5 g/dL (ref 12.0–15.0)
MCH: 27.4 pg (ref 26.0–34.0)
MCHC: 32.5 g/dL (ref 30.0–36.0)
MCV: 84.4 fL (ref 78.0–100.0)
PLATELETS: 304 10*3/uL (ref 150–400)
RBC: 4.56 MIL/uL (ref 3.87–5.11)
RDW: 14.3 % (ref 11.5–15.5)
WBC: 9.1 10*3/uL (ref 4.0–10.5)

## 2016-07-12 LAB — LIPASE, BLOOD: LIPASE: 17 U/L (ref 11–51)

## 2016-07-12 MED ORDER — IBUPROFEN 600 MG PO TABS: 600.0000 mg | ORAL_TABLET | Freq: Four times a day (QID) | ORAL | 0 refills | Status: DC | PRN

## 2016-07-12 MED ORDER — ACETAMINOPHEN 325 MG PO TABS
650.0000 mg | ORAL_TABLET | Freq: Once | ORAL | Status: AC
  Administered 2016-07-12: 650 mg via ORAL
  Filled 2016-07-12: qty 2

## 2016-07-12 MED ORDER — GABAPENTIN 300 MG PO CAPS: 300.0000 mg | ORAL_CAPSULE | Freq: Three times a day (TID) | ORAL | 0 refills | Status: DC

## 2016-07-12 MED ORDER — IBUPROFEN 400 MG PO TABS
600.0000 mg | ORAL_TABLET | Freq: Once | ORAL | Status: AC
  Administered 2016-07-12: 600 mg via ORAL
  Filled 2016-07-12: qty 1

## 2016-07-12 MED ORDER — ACETAMINOPHEN 500 MG PO TABS: 500.0000 mg | ORAL_TABLET | Freq: Four times a day (QID) | ORAL | 0 refills | Status: DC | PRN

## 2016-07-12 NOTE — Discharge Instructions (Signed)
Please see your primary doctor for potential referral to gynecologist for chronic pelvic pain since your operation. May need further imaging on an outpatient basis as determined by your primary doctor.

## 2016-07-12 NOTE — ED Notes (Signed)
Gave the patient crackers and a Sprite with ice, per Nehemiah Settle, Charity fundraiser.

## 2016-07-12 NOTE — ED Provider Notes (Signed)
[facility] DEPT Provider Note   CSN: [id number] Arrival date & time: 07/12/16  1219  First Provider Contact:  First MD Initiated Contact with Patient 07/12/16 1500     History   Chief Complaint Chief Complaint  Patient presents with  . Abdominal Pain    HPI Veronica [name] is a 25 y.o. female.  The history is provided by the patient. No language interpreter was used.  Abdominal Pain   This is a chronic problem. Episode onset: In 2011. The problem occurs rarely. The problem has not changed since onset.The pain is associated with a previous surgery. The pain is located in the suprapubic region. The pain is at a severity of 7/10. The pain is moderate. Pertinent negatives include anorexia, fever, belching, diarrhea, flatus, hematochezia, melena, nausea, vomiting, constipation, dysuria, frequency, hematuria, headaches, arthralgias and myalgias. Nothing aggravates the symptoms. Nothing relieves the symptoms. Past workup includes surgery.    Past Medical History:  Diagnosis Date  . Anemia   . Ankle pain   . GERD (gastroesophageal reflux disease)   . Hiatal hernia   . Obesity (BMI 30-39.9)     Patient Active Problem List   Diagnosis Date Noted  . Neuropathic pain 05/10/2016  . Fatigue 05/09/2016  . Polyuria 05/09/2016  . Vaginal yeast infection 03/07/2016  . Diarrhea 04/20/2015  . Bacterial vaginosis 01/19/2014  . Morbid obesity (HCC) 08/25/2013  . Potential exposure to STD 01/13/2013  . Dysuria 01/13/2013  . Cerumen impaction 10/06/2012  . Eyelid laceration, right 01/29/2012  . Abdominal pain 01/06/2012  . Healthcare maintenance 10/20/2011  . Pelvic pain in female 10/20/2011  . Headache(784.0) 08/26/2011  . Birth control 08/26/2011  . Numbness 08/26/2011  . Acne 03/28/2011  . GERD (gastroesophageal reflux disease) 03/28/2011  . Obesity (BMI 30-39.9) 02/27/2011  . Anemia 02/27/2011    Past Surgical History:  Procedure Laterality Date  . CESAREAN SECTION   09/02/2010   For macrosomia, but son was 7 lb 12 oz  . TONSILLECTOMY AND ADENOIDECTOMY      OB History    No data available       Home Medications    Prior to Admission medications   Medication Sig Start Date End Date Taking? Authorizing Provider  acetaminophen (TYLENOL) 500 MG tablet Take 1 tablet (500 mg total) by mouth every 6 (six) hours as needed. 07/12/16   Dan Humphreys, MD  esomeprazole (NEXIUM) 40 MG capsule Take 40 mg by mouth daily at 12 noon.    Historical Provider, MD  etonogestrel (NEXPLANON) 68 MG IMPL implant 1 each by Subdermal route once.    Historical Provider, MD  Fe Fum-FePoly-FA-Vit C-Vit B3 (INTEGRA F) 125-1 MG CAPS Take 1 capsule by mouth daily. 05/12/16   [name], MD  fluconazole (DIFLUCAN) 150 MG tablet Take one tablet today and the next 3 days from now (Monday, April 3). 03/07/16   Uvaldo Rising, MD  gabapentin (NEURONTIN) 300 MG capsule Take 1 capsule (300 mg total) by mouth 3 (three) times daily. 07/12/16 07/26/16  Dan Humphreys, MD  ibuprofen (ADVIL,MOTRIN) 600 MG tablet Take 1 tablet (600 mg total) by mouth every 6 (six) hours as needed. 07/12/16   Dan Humphreys, MD    Family History Family History  Problem Relation Age of Onset  . Diabetes Mother   . Hypertension Mother   . Obesity Mother   . Obesity Sister   . Osteochondroma Sister   . Diabetes Sister   . Hypertension Sister  Social History Social History  Substance Use Topics  . Smoking status: Never Smoker  . Smokeless tobacco: Never Used  . Alcohol use No     Allergies   Morphine and related   Review of Systems Review of Systems  Constitutional: Negative for chills and fever.  HENT: Negative for ear pain and sore throat.   Eyes: Negative for pain and visual disturbance.  Respiratory: Negative for cough and shortness of breath.   Cardiovascular: Negative for chest pain and palpitations.  Gastrointestinal: Positive for abdominal pain. Negative for anorexia, constipation,  diarrhea, flatus, hematochezia, melena, nausea and vomiting.  Genitourinary: Negative for dysuria, frequency and hematuria.  Musculoskeletal: Negative for arthralgias, back pain and myalgias.  Skin: Negative for color change and rash.  Neurological: Negative for seizures, syncope and headaches.  All other systems reviewed and are negative.    Physical Exam Updated Vital Signs BP 119/55 (BP Location: Right Arm) Comment: Simultaneous filing. User may not have seen previous data.  Pulse 76 Comment: Simultaneous filing. User may not have seen previous data.  Temp 98.4 F (36.9 C) (Oral)   Resp 16   Ht 5\' 2"  (1.575 m)   Wt 127 kg   SpO2 100% Comment: Simultaneous filing. User may not have seen previous data.  BMI 51.21 kg/m   Physical Exam  Constitutional: She appears well-developed and well-nourished. No distress.  HENT:  Head: Normocephalic and atraumatic.  Eyes: Conjunctivae are normal.  Neck: Neck supple.  Cardiovascular: Normal rate and regular rhythm.   No murmur heard. Pulmonary/Chest: Effort normal and breath sounds normal. No respiratory distress.  Abdominal: Soft. She exhibits distension. She exhibits no mass. There is no tenderness. There is no rebound and no guarding. No hernia.  Musculoskeletal: She exhibits no edema.  Neurological: She is alert.  Skin: Skin is warm and dry.  Psychiatric: She has a normal mood and affect.  Nursing note and vitals reviewed.    ED Treatments / Results  Labs (all labs ordered are listed, but only abnormal results are displayed) Labs Reviewed  COMPREHENSIVE METABOLIC PANEL - Abnormal; Notable for the following:       Result Value   Calcium 8.6 (*)    AST 11 (*)    ALT 13 (*)    All other components within normal limits  URINALYSIS, ROUTINE W REFLEX MICROSCOPIC (NOT AT [facility]) - Abnormal; Notable for the following:    APPearance CLOUDY (*)    Hgb urine dipstick LARGE (*)    All other components within normal limits  URINE  MICROSCOPIC-ADD ON - Abnormal; Notable for the following:    Squamous Epithelial / LPF 0-5 (*)    Bacteria, UA RARE (*)    All other components within normal limits  LIPASE, BLOOD  CBC  I-STAT BETA HCG BLOOD, ED ([facility], [facility], [facility] ONLY)    EKG  EKG Interpretation None       Radiology No results found.  Procedures Procedures (including critical care time)  Medications Ordered in ED Medications  ibuprofen (ADVIL,MOTRIN) tablet 600 mg (600 mg Oral Given 07/12/16 1527)  acetaminophen (TYLENOL) tablet 650 mg (650 mg Oral Given 07/12/16 1527)     Initial Impression / Assessment and Plan / ED Course  I have reviewed the triage vital signs and the nursing notes.  Pertinent labs & imaging results that were available during my care of the patient were reviewed by me and considered in my medical decision making (see chart for details).  Clinical Course  Patient is a 25 year old female with history of C-section, chronic abdominal pain following this procedure who presents for evaluation of lower abdominal pain, burning in nature. This is the same pain that going on for the past 6 years. She states it is somewhat worse today. She denies any nausea, vomiting, diarrhea, urinary symptoms. No pelvic discharge.  Vital signs stable. Patient overall well appearing. She has a soft nontender nondistended abdomen. There is no sign of infection over the surgical site.  Laboratory studies reviewed. Pregnancy negative, lipase normal, CMP at baseline, CBC normal.  UA negative for urinary tract infection.  Patient taking Neurontin at home, 100 mg 3 times a day. She is not taking any medications such as ibuprofen or Tylenol.  Pain likely chronic in nature related to previous surgery. No new or emergent findings on history or physical exam today.  I feel the patient is able to optimize regimen at home with ibuprofen every 4-6 hours, Tylenol every 4-6 hours, increased dose of Neurontin.  I encouraged  patient to follow-up with her family practice physician or gynecologist for long-term management and treatment of chronic abdominal pain. Do not feel that imaging is necessary at this time due to no new features of pain, reassuring laboratory studies and vital signs.  Patient ambulatory in no acute distress at time of discharge.  Discussed w/ Dr. Preston Fleeting.    Final Clinical Impressions(s) / ED Diagnoses   Final diagnoses:  Abdominal pain, unspecified abdominal location    New Prescriptions New Prescriptions   ACETAMINOPHEN (TYLENOL) 500 MG TABLET    Take 1 tablet (500 mg total) by mouth every 6 (six) hours as needed.   GABAPENTIN (NEURONTIN) 300 MG CAPSULE    Take 1 capsule (300 mg total) by mouth 3 (three) times daily.   IBUPROFEN (ADVIL,MOTRIN) 600 MG TABLET    Take 1 tablet (600 mg total) by mouth every 6 (six) hours as needed.     Dan Humphreys, MD 07/12/16 1650    Dione Booze, MD 07/13/16 [phone]

## 2016-07-12 NOTE — ED Notes (Signed)
Pt verbalized understanding of d/c instructions and has no further questions. Pt stable and NAD.  

## 2016-07-12 NOTE — ED Triage Notes (Signed)
Pt reports has had chronic lower abd pain at c section site since 2011, believed to be from ? Nerve damage. Usually has pain everyday but worse today.  Pt takes Neurontin with no relief.

## 2016-09-23 ENCOUNTER — Ambulatory Visit: Payer: Medicaid Other | Admitting: Student

## 2016-09-24 ENCOUNTER — Encounter: Payer: Self-pay | Admitting: Internal Medicine

## 2016-09-24 ENCOUNTER — Ambulatory Visit (INDEPENDENT_AMBULATORY_CARE_PROVIDER_SITE_OTHER): Payer: Self-pay | Admitting: Internal Medicine

## 2016-09-24 DIAGNOSIS — H5789 Other specified disorders of eye and adnexa: Secondary | ICD-10-CM | POA: Insufficient documentation

## 2016-09-24 DIAGNOSIS — H578 Other specified disorders of eye and adnexa: Secondary | ICD-10-CM

## 2016-09-24 MED ORDER — CETIRIZINE HCL 10 MG PO TABS: 10.0000 mg | ORAL_TABLET | Freq: Every day | ORAL | 11 refills | Status: DC

## 2016-09-24 NOTE — Progress Notes (Signed)
   Subjective:    Patient ID: Veronica [name], female    DOB: May 31, 1991, 25 y.o.   MRN: [id number]  HPI  Patient presents for same day appointment for red eyes and ear pain.  Patient reports waking up every morning with red eyes. Began 3 weeks ago. Denies pain, tearing, or discharge. No changes in vision. Has not tried eye drops or anything else to improve symptoms.  Also reports aching ears for the past three months. Denies changes in hearing, discharge. Has not tried anything to help.  Finally reports HA for the past week. Reports that sometimes her vision becomes blurry when her headache is particularly bad. Advil relieves her headaches.   Denies history of seasonal allergies.   Review of Systems See HPI.     Objective:   Physical Exam  Constitutional: She is oriented to person, place, and time. She appears well-developed and well-nourished. No distress.  HENT:  Head: Normocephalic and atraumatic.  Right Ear: External ear normal.  Left Ear: External ear normal.  Nose: Nose normal.  Mouth/Throat: Oropharynx is clear and moist. No oropharyngeal exudate.  Eyes: Conjunctivae and EOM are normal. Pupils are equal, round, and reactive to light. Right eye exhibits no discharge. Left eye exhibits no discharge. No scleral icterus.  Pulmonary/Chest: Effort normal. No respiratory distress.  Neurological: She is alert and oriented to person, place, and time.  Psychiatric: She has a normal mood and affect. Her behavior is normal.      Assessment & Plan:  Redness of both eyes Possibly 2/2 to seasonal allergies, especially with accompanying symptoms of HA and sinus pressure/ear pain. Less likely 2/2 conjunctivitis, as symptoms ongoing for three weeks, and no eye pain, watering, or discharge. Also unlikely given no redness or other abnormalities on physical eye exam.  - Begin Zyrtec qd - Return if no improvement in a few weeks - Can continue Advil PRN headache  Tarri AbernethyAbigail J Quyen Cutsforth, MD,  MPH PGY-2 [facility] Pager [phone]

## 2016-09-24 NOTE — Patient Instructions (Addendum)
It was nice meeting you today Veronica [name]!  You can begin taking Zyrtec (cetirizine) one tablet every day. You can continue to take Advil to help with your headaches as needed.   If your symptoms do not improve after taking the medication for a few weeks, please call to schedule another appointment.   If you have any questions or concerns, please feel free to call the clinic.   Be well,  Dr. Natale MilchLancaster

## 2016-09-24 NOTE — Assessment & Plan Note (Signed)
Possibly 2/2 to seasonal allergies, especially with accompanying symptoms of HA and sinus pressure/ear pain. Less likely 2/2 conjunctivitis, as symptoms ongoing for three weeks, and no eye pain, watering, or discharge. Also unlikely given no redness or other abnormalities on physical eye exam.  - Begin Zyrtec qd - Return if no improvement in a few weeks - Can continue Advil PRN headache

## 2016-09-26 ENCOUNTER — Telehealth: Payer: Self-pay | Admitting: Family Medicine

## 2016-09-26 NOTE — Telephone Encounter (Signed)
Received call from patient's mother about redness in the patient's eye. Mother says that the patient is currently sleeping normally, but wanted to make sure that everything was ok with her daughter. Mother is concerned that she may have a "popped blood vessel" in her here eye. No eye pain. No vision changes. [facility] and diagnosed with allergies. Instructed mother that if her symptoms were not improving that she should be [facility]. Discussed reasons to return to care earlier including changes in visions or severe pain. Patient's mother voiced understanding and had no further questions.  Katina Degreealeb M. Jimmey RalphParker, MD [facility] Resident PGY-3 09/27/2016 12:03 AM

## 2016-09-27 ENCOUNTER — Encounter (HOSPITAL_COMMUNITY): Payer: Self-pay | Admitting: *Deleted

## 2016-09-27 ENCOUNTER — Emergency Department (HOSPITAL_COMMUNITY)
Admission: EM | Admit: 2016-09-27 | Discharge: 2016-09-27 | Disposition: A | Discharge status: Home / Self Care | Payer: Medicaid Other | Attending: Emergency Medicine | Admitting: Emergency Medicine

## 2016-09-27 DIAGNOSIS — H1131 Conjunctival hemorrhage, right eye: Secondary | ICD-10-CM

## 2016-09-27 DIAGNOSIS — Z79899 Other long term (current) drug therapy: Secondary | ICD-10-CM | POA: Insufficient documentation

## 2016-09-27 MED ORDER — ARTIFICIAL TEARS OP OINT: TOPICAL_OINTMENT | OPHTHALMIC | 0 refills | Status: DC | PRN

## 2016-09-27 NOTE — ED Triage Notes (Signed)
Pt presents with eye problem RT eye. RT eye red medial side of eye.

## 2016-09-27 NOTE — ED Notes (Signed)
Declined W/C at D/C and was escorted to lobby by RN. 

## 2016-09-27 NOTE — Discharge Instructions (Signed)
Return here as needed.  Follow-up with your primary care doctor °

## 2016-09-27 NOTE — ED Provider Notes (Signed)
[facility] Provider Note   CSN: [id number] Arrival date & time: 09/27/16  [id number]  By signing my name below, I, Clovis PuAvnee Patel, attest that this documentation has been prepared under the direction and in the presence of  BoeingChris Lutisha Knoche, [name]. Electronically Signed: Clovis PuAvnee Patel, ED Scribe. 09/27/16. 9:08 AM.   History   Chief Complaint Chief Complaint  Patient presents with  . Eye Problem    The history is provided by the patient. No language interpreter was used.   HPI Comments:  Veronica [name] is a 25 y.o. female who presents to the Emergency Department complaining of R eye redness x 3 days. Pt states she had 1 episode of emesis and cough ~ 1 week ago and noticed her eye problem following this incident. Associated symptoms include eye discomfort and intermittent headaches x 1 month. No alleviating factors noted. Pt denies any other complaints at this time.     Past Medical History:  Diagnosis Date  . Anemia   . Ankle pain   . GERD (gastroesophageal reflux disease)   . Hiatal hernia   . Obesity (BMI 30-39.9)     Patient Active Problem List   Diagnosis Date Noted  . Redness of both eyes 09/24/2016  . Neuropathic pain 05/10/2016  . Fatigue 05/09/2016  . Polyuria 05/09/2016  . Vaginal yeast infection 03/07/2016  . Diarrhea 04/20/2015  . Bacterial vaginosis 01/19/2014  . Morbid obesity (HCC) 08/25/2013  . Potential exposure to STD 01/13/2013  . Dysuria 01/13/2013  . Cerumen impaction 10/06/2012  . Eyelid laceration, right 01/29/2012  . Abdominal pain 01/06/2012  . Healthcare maintenance 10/20/2011  . Pelvic pain in female 10/20/2011  . Headache(784.0) 08/26/2011  . Birth control 08/26/2011  . Numbness 08/26/2011  . Acne 03/28/2011  . GERD (gastroesophageal reflux disease) 03/28/2011  . Obesity (BMI 30-39.9) 02/27/2011  . Anemia 02/27/2011    Past Surgical History:  Procedure Laterality Date  . CESAREAN SECTION  09/02/2010   For macrosomia, but son was 7 lb  12 oz  . TONSILLECTOMY AND ADENOIDECTOMY      OB History    No data available       Home Medications    Prior to Admission medications   Medication Sig Start Date End Date Taking? Authorizing Provider  acetaminophen (TYLENOL) 500 MG tablet Take 1 tablet (500 mg total) by mouth every 6 (six) hours as needed. 07/12/16   Dan HumphreysMichael Irick, MD  cetirizine (ZYRTEC) 10 MG tablet Take 1 tablet (10 mg total) by mouth daily. 09/24/16   Marquette SaaAbigail Joseph Lancaster, MD  esomeprazole (NEXIUM) 40 MG capsule Take 40 mg by mouth daily at 12 noon.    Historical Provider, MD  etonogestrel (NEXPLANON) 68 MG IMPL implant 1 each by Subdermal route once.    Historical Provider, MD  Fe Fum-FePoly-FA-Vit C-Vit B3 (INTEGRA F) 125-1 MG CAPS Take 1 capsule by mouth daily. 05/12/16   Asiyah Mayra ReelZahra Mikell, MD  fluconazole (DIFLUCAN) 150 MG tablet Take one tablet today and the next 3 days from now (Monday, April 3). 03/07/16   Uvaldo RisingKyle J Fletke, MD  gabapentin (NEURONTIN) 300 MG capsule Take 1 capsule (300 mg total) by mouth 3 (three) times daily. 07/12/16 07/26/16  Dan HumphreysMichael Irick, MD  ibuprofen (ADVIL,MOTRIN) 600 MG tablet Take 1 tablet (600 mg total) by mouth every 6 (six) hours as needed. 07/12/16   Dan HumphreysMichael Irick, MD    Family History Family History  Problem Relation Age of Onset  . Diabetes Mother   . Hypertension  Mother   . Obesity Mother   . Obesity Sister   . Osteochondroma Sister   . Diabetes Sister   . Hypertension Sister     Social History Social History  Substance Use Topics  . Smoking status: Never Smoker  . Smokeless tobacco: Never Used  . Alcohol use No     Allergies   Morphine and related   Review of Systems Review of Systems  Eyes: Positive for pain and redness.  Neurological: Positive for headaches.     Physical Exam Updated Vital Signs BP 122/63 (BP Location: Right Arm)   Pulse 66   Temp 97.6 F (36.4 C) (Oral)   Resp 18   Ht 5\' 2"  (1.575 m)   Wt 286 lb (129.7 kg)   LMP 08/24/2016  (Approximate) Comment: hasn't had in 3 years but came last month lasted 5 days  SpO2 100%   BMI 52.31 kg/m   Physical Exam  HENT:  Head: Normocephalic and atraumatic.  Eyes:  Subconjunctival hemorrhage of the R medial eye. Blood stops right at the iris. Normal ROM of the eye.  Pulmonary/Chest: Effort normal.  Skin: Skin is warm and dry. No rash noted.  Psychiatric: She has a normal mood and affect.     ED Treatments / Results  DIAGNOSTIC STUDIES:  Oxygen Saturation is 100% on RA, normal by my interpretation.    COORDINATION OF CARE:  9:15 AM Discussed treatment plan with pt at bedside and pt agreed to plan.  Labs (all labs ordered are listed, but only abnormal results are displayed) Labs Reviewed - No data to display  EKG  EKG Interpretation None       Radiology No results found.  Procedures Procedures (including critical care time)  Medications Ordered in ED Medications - No data to display   Initial Impression / Assessment and Plan / ED Course  I have reviewed the triage vital signs and the nursing notes.  Pertinent labs & imaging results that were available during my care of the patient were reviewed by me and considered in my medical decision making (see chart for details).  Clinical Course    Patient presentation consistent with subconjunctival hemorrhage. Presentation not concerning for iritis, or corneal abrasions. . Patient advised to follow up with ophthalmologist if symptoms persist or worsen. Return precautions discussed.  Patient verbalizes understanding and is agreeable with discharge.   Final Clinical Impressions(s) / ED Diagnoses   Final diagnoses:  None    New Prescriptions New Prescriptions   No medications on file  I personally performed the services described in this documentation, which was scribed in my presence. The recorded information has been reviewed and is accurate.   Charlestine Night, [name] 10/02/16 0009    Jacalyn Lefevre, MD 10/06/16 [phone]

## 2016-10-07 ENCOUNTER — Ambulatory Visit: Payer: Medicaid Other | Admitting: Internal Medicine

## 2017-03-04 ENCOUNTER — Ambulatory Visit (INDEPENDENT_AMBULATORY_CARE_PROVIDER_SITE_OTHER): Payer: Self-pay | Admitting: Internal Medicine

## 2017-03-04 ENCOUNTER — Encounter: Payer: Self-pay | Admitting: Internal Medicine

## 2017-03-04 VITALS — BP 110/65 | HR 77 | Temp 98.2°F | Ht 62.0 in | Wt 294.2 lb

## 2017-03-04 DIAGNOSIS — N3000 Acute cystitis without hematuria: Secondary | ICD-10-CM

## 2017-03-04 DIAGNOSIS — R300 Dysuria: Secondary | ICD-10-CM

## 2017-03-04 DIAGNOSIS — N3090 Cystitis, unspecified without hematuria: Secondary | ICD-10-CM

## 2017-03-04 DIAGNOSIS — N926 Irregular menstruation, unspecified: Secondary | ICD-10-CM

## 2017-03-04 LAB — POCT URINALYSIS DIP (MANUAL ENTRY)
BILIRUBIN UA: NEGATIVE
Bilirubin, UA: NEGATIVE
Glucose, UA: NEGATIVE
Nitrite, UA: NEGATIVE
Protein Ur, POC: NEGATIVE
SPEC GRAV UA: 1.02 (ref 1.030–1.035)
Urobilinogen, UA: 0.2 (ref ?–2.0)
pH, UA: 6.5 (ref 5.0–8.0)

## 2017-03-04 LAB — POCT UA - MICROSCOPIC ONLY

## 2017-03-04 LAB — POCT URINE PREGNANCY: Preg Test, Ur: NEGATIVE

## 2017-03-04 MED ORDER — CEPHALEXIN 500 MG PO CAPS: 500.0000 mg | ORAL_CAPSULE | Freq: Two times a day (BID) | ORAL | 0 refills | Status: DC

## 2017-03-04 NOTE — Patient Instructions (Addendum)
You have a UTI. Please take Keflex as prescribed for 7 days. Please return to clinic if you start having fevers or chills despite the antibiotic.

## 2017-03-04 NOTE — Progress Notes (Signed)
   [facility] Phone: [phone]   Date of Visit: 03/04/2017   HPI:  Veronica [name] is a 26 y.o. female presenting to clinic today for same day appointment. PCP: Veronica MaiersAsiyah Z Mikell, MD Concerns today include:  Dysuria:  - urinary pressure, urinary urgency, dysuria for 2.5 weeks - no blood in urine - no fevers or flank pain  - last UTI was last year  - no vaginal discharge  - on Nexplanon for birth control. Reports of having periods the past 3-4 months but did not have a period this month.   Left Ear Pain: - noticed recently  - no drainage, decreased hearing, or tinnitus.   ROS: See HPI.  PMFSH:  PMH: GERD Acne Anemia Obesity   PHYSICAL EXAM: BP 110/65 (BP Location: Right Arm, Patient Position: Sitting, Cuff Size: Large)   Pulse 77   Temp 98.2 F (36.8 C) (Oral)   Ht 5\' 2"  (1.575 m)   Wt 294 lb 3.2 oz (133.4 kg)   SpO2 98%   BMI 53.81 kg/m  GEN: NAD HEENT: Atraumatic, normocephalic, neck supple, EOMI, sclera clear. Left TM: with ear wax (flushed with water; ear pain resolved after flush) and TM looks normal. Right TM normal.  CV: RRR, no murmurs, rubs, or gallops PULM: CTAB, normal effort ABD: Soft, nontender, nondistended, NABS, no organomegaly MSK: no CVA tenderness  SKIN: No rash or cyanosis; warm and well-perfused EXTR: No lower extremity edema or calf tenderness PSYCH: Mood and affect euthymic, normal rate and volume of speech NEURO: Awake, alert, no focal deficits grossly, normal speech  ASSESSMENT/PLAN: Acute Cystitis: UA significant for large Leukotyes and micro with TNTC  - Keflex 500mg  BID x 7 days - urine culture ordered - return precautions discussed   Palma HolterKanishka G Chelsi Warr, MD PGY 2 [facility]

## 2017-03-08 LAB — URINE CULTURE

## 2017-04-29 ENCOUNTER — Other Ambulatory Visit: Payer: Self-pay | Admitting: *Deleted

## 2017-04-29 MED ORDER — INTEGRA F 125-1 MG PO CAPS: 1.0000 | ORAL_CAPSULE | Freq: Every day | ORAL | 6 refills | Status: DC

## 2017-06-01 ENCOUNTER — Ambulatory Visit (INDEPENDENT_AMBULATORY_CARE_PROVIDER_SITE_OTHER): Payer: Self-pay | Admitting: Family Medicine

## 2017-06-01 ENCOUNTER — Encounter: Payer: Self-pay | Admitting: Family Medicine

## 2017-06-01 VITALS — BP 118/82 | HR 85 | Temp 98.3°F | Ht 62.0 in | Wt 306.0 lb

## 2017-06-01 DIAGNOSIS — R519 Headache, unspecified: Secondary | ICD-10-CM

## 2017-06-01 DIAGNOSIS — R51 Headache: Secondary | ICD-10-CM

## 2017-06-01 MED ORDER — FLUTICASONE PROPIONATE 50 MCG/ACT NA SUSP: 2.0000 | Freq: Every day | NASAL | 0 refills | Status: DC

## 2017-06-01 NOTE — Progress Notes (Signed)
Subjective: CC: Headaches HPI: Patient is a 26 y.o. female with a past medical history of morbid obesity presenting to clinic today for concern for headaches.   Waking up with headaches daily x 1-2 months. Pain feels like pressure. Located in the maxillary, frontal, and temporal regions. She notes eyes are puffy and conjunctiva are injected. No photophobia or sound sensitivity. Looking down makes the pain worse. Lasts x 45 minutes and self resolves.  No N/V. No change in vision  No nasal congestion, rhinorrhea, sore throat, lacrimation, unilateral weakness, unilateral numbness or tingling. She snores.  Significant daytime sleepiness and weakness that is at her baseline for years.  No history of head trauma.  She has a history of headaches. She states a partially 6 months ago she had headaches that occurred daily for a week or 2 and then self resolve. It was similar to this episode.  Social History: Nonsmoker   ROS: All other systems reviewed and are negative.  Past Medical History Patient Active Problem List   Diagnosis Date Noted  . Redness of both eyes 09/24/2016  . Neuropathic pain 05/10/2016  . Fatigue 05/09/2016  . Polyuria 05/09/2016  . Vaginal yeast infection 03/07/2016  . Diarrhea 04/20/2015  . Bacterial vaginosis 01/19/2014  . Morbid obesity (HCC) 08/25/2013  . Potential exposure to STD 01/13/2013  . Dysuria 01/13/2013  . Cerumen impaction 10/06/2012  . Eyelid laceration, right 01/29/2012  . Abdominal pain 01/06/2012  . Healthcare maintenance 10/20/2011  . Pelvic pain in female 10/20/2011  . Headache(784.0) 08/26/2011  . Birth control 08/26/2011  . Numbness 08/26/2011  . Acne 03/28/2011  . GERD (gastroesophageal reflux disease) 03/28/2011  . Obesity (BMI 30-39.9) 02/27/2011  . Anemia 02/27/2011    Medications- reviewed and updated Current Outpatient Prescriptions  Medication Sig Dispense Refill  . acetaminophen (TYLENOL) 500 MG tablet Take 1  tablet (500 mg total) by mouth every 6 (six) hours as needed. 30 tablet 0  . artificial tears (LACRILUBE) OINT ophthalmic ointment Place into the right eye every 4 (four) hours as needed for dry eyes. 3.5 g 0  . esomeprazole (NEXIUM) 40 MG capsule Take 40 mg by mouth daily at 12 noon.    . etonogestrel (NEXPLANON) 68 MG IMPL implant 1 each by Subdermal route once.    . Fe Fum-FePoly-FA-Vit C-Vit B3 (INTEGRA F) 125-1 MG CAPS Take 1 capsule by mouth daily. 30 capsule 6  . fluticasone (FLONASE) 50 MCG/ACT nasal spray Place 2 sprays into both nostrils daily. 16 g 0  . ibuprofen (ADVIL,MOTRIN) 600 MG tablet Take 1 tablet (600 mg total) by mouth every 6 (six) hours as needed. 30 tablet 0   No current facility-administered medications for this visit.    Facility-Administered Medications Ordered in Other Visits  Medication Dose Route Frequency Provider Last Rate Last Dose  . etonogestrel (IMPLANON) implant 68 mg  68 mg Subcutaneous Once Charm RingsHonig, Erin J, MD        Objective: Office vital signs reviewed. BP 118/82   Pulse 85   Temp 98.3 F (36.8 C) (Oral)   Ht 5\' 2"  (1.575 m)   Wt (!) 306 lb (138.8 kg)   SpO2 95%   BMI 55.97 kg/m    Physical Examination:  General: Awake, alert, Well nourished, NAD. Obese ENMT:  TMs intact, normal light reflex, no erythema, no bulging. Nasal turbinates moist. MMM, Oropharynx clear without erythema or tonsillar exudate/hypertrophy. Funduscopic exam unremarkable. Eyes: Conjunctiva non-injected. PERRL.  Tenderness to palpation over the maxillary and  frontal sinuses. No tenderness over the cervical spine. Cardio: RRR, no m/r/g noted.  Pulm: No increased WOB.  CTAB, without wheezes, rhonchi or crackles noted.  Neuro: A&O x4. Speech clear. EOMI, Uvula and tongue midline. Facial movements symmetric. 5/5 strength in the upper extremities and lower extremities bilaterally. Sensation intact bilaterally. Normal DTRs.    Assessment/Plan: Headache History and exam  consistent with a sinus headache.  Cluster headaches are also on the differential, but the patient lacks many of these symptoms.  No thunder-clap type headache concerning for bleed. No history of trauma. No neurologic deficits noted. -Flonase daily -If no improvement in symptoms, could consider sleep apnea given the headaches are present on waking. -No neurologic deficits concerning for intracranial mass, however we'll continue to keep this on the differential given headaches are present on awakening. -Patient to follow-up with her PCP in 1-2 weeks if there is no improvement in her symptoms. Return precautions discussed.   No orders of the defined types were placed in this encounter.   Meds ordered this encounter  Medications  . fluticasone (FLONASE) 50 MCG/ACT nasal spray    Sig: Place 2 sprays into both nostrils daily.    Dispense:  16 g    Refill:  0    Joanna Puff PGY-3, [facility]

## 2017-06-01 NOTE — Assessment & Plan Note (Signed)
History and exam consistent with a sinus headache.  Cluster headaches are also on the differential, but the patient lacks many of these symptoms.  No thunder-clap type headache concerning for bleed. No history of trauma. No neurologic deficits noted. -Flonase daily -If no improvement in symptoms, could consider sleep apnea given the headaches are present on waking. -No neurologic deficits concerning for intracranial mass, however we'll continue to keep this on the differential given headaches are present on awakening. -Patient to follow-up with her PCP in 1-2 weeks if there is no improvement in her symptoms. Return precautions discussed.

## 2017-06-01 NOTE — Patient Instructions (Signed)
Your blood pressure today looks wonderful.  I suspect your headaches are coming due to inflammation of the sinuses. I prescribed Flonase to use intranasally daily. If you note new symptoms, worsening symptoms, or your symptoms fail to improve over the next week or 2, please follow-up with your primary care doctor   Sinus Headache A sinus headache happens when your sinuses become clogged or swollen. You may feel pain or pressure in your face, forehead, ears, or upper teeth. Sinus headaches can be mild or severe. Follow these instructions at home:  Take medicines only as told by your doctor.  If you were given an antibiotic medicine, finish all of it even if you start to feel better.  Use a nose spray if you feel stuffed up (congested).  If told, apply a warm, moist washcloth to your face to help lessen pain. Contact a doctor if:  You get headaches more than one time each week.  Light or sound bothers you.  You have a fever.  You feel sick to your stomach (nauseous) or you throw up (vomit).  Your headaches do not get better with treatment. Get help right away if:  You have trouble seeing.  You suddenly have very bad pain in your face or head.  You start to twitch or shake (seizure).  You are confused.  You have a stiff neck. This information is not intended to replace advice given to you by your health care provider. Make sure you discuss any questions you have with your health care provider. Document Released: 03/26/2011 Document Revised: 07/20/2016 Document Reviewed: 11/20/2014 Elsevier Interactive Patient Education  [employer]2018 [employer]

## 2017-06-05 ENCOUNTER — Ambulatory Visit: Payer: Medicaid Other | Admitting: Family Medicine

## 2017-06-05 NOTE — Progress Notes (Deleted)
   Subjective:   Patient ID: Veronica [name]    DOB: 1990/12/25, 26 y.o. female   MRN: [id number]  CC: "Nexplanon removal"  HPI: Veronica [name] is a 26 y.o. female who presents to clinic today to discuss removal of Nexpalnon. Problems discussed today are as follows:  ***: *** ROS: ***  Complete ROS performed, see HPI for pertinent.  PMFSH: HTN, morbid obesity. H/o c-sec x1. Mother h/o DM, HTN, obesity, sister h/o obesity, osteochondroma, DM, HTN. Smoking status reviewed. Medications reviewed.  Objective:   There were no vitals taken for this visit. Vitals and nursing note reviewed.  General: well nourished, well developed, in no acute distress with non-toxic appearance HEENT: normocephalic, atraumatic, moist mucous membranes Neck: supple, non-tender without lymphadenopathy CV: regular rate and rhythm without murmurs, rubs, or gallops, no lower extremity edema Lungs: clear to auscultation bilaterally with normal work of breathing Abdomen: soft, non-tender, non-distended, no masses or organomegaly palpable, normoactive bowel sounds Skin: warm, dry, no rashes or lesions, cap refill < 2 seconds Extremities: warm and well perfused, normal tone  Assessment & Plan:   No problem-specific Assessment & Plan notes found for this encounter.  No orders of the defined types were placed in this encounter.  No orders of the defined types were placed in this encounter.   This note has been created with Education officer, environmentalDragon speech recognition software and smart phrase technology. Any transcriptional errors are unintentional.  Durward Parcelavid McMullen, DO [facility], PGY-1 06/05/2017 11:04 AM

## 2017-06-19 ENCOUNTER — Encounter: Payer: Self-pay | Admitting: Internal Medicine

## 2017-06-19 ENCOUNTER — Ambulatory Visit (INDEPENDENT_AMBULATORY_CARE_PROVIDER_SITE_OTHER): Payer: Self-pay | Admitting: Internal Medicine

## 2017-06-19 VITALS — BP 110/70 | HR 89 | Temp 98.3°F | Ht 62.0 in | Wt 304.0 lb

## 2017-06-19 DIAGNOSIS — Z3009 Encounter for other general counseling and advice on contraception: Secondary | ICD-10-CM

## 2017-06-19 NOTE — Progress Notes (Signed)
   [facility] Noralee CharsAsiyah Claxton Levitz, MD Phone: [phone]  Reason For Visit: F/U for Nexplanon Removal   # Patient had nexplanon placed last year. She states she would like to have it removed. She states that she feels like her hair is thinning. She feels more moody than normal. Patient would like to start oral contraceptive moving forward.   Past Medical History Reviewed problem list.  Medications- reviewed and updated No additions to family history Social history- patient is a non- smoker  Objective: BP 110/70   Pulse 89   Temp 98.3 F (36.8 C) (Oral)   Ht 5\' 2"  (1.575 m)   Wt (!) 304 lb (137.9 kg)   LMP 06/11/2017   SpO2 99%   BMI 55.60 kg/m  Gen: NAD, alert, cooperative with exam MSK: Normal gait and station Skin: dry, intact, no rashes or lesions, could feel the nexplanon in left arm    Assessment/Plan: See problem based a/p  Contraceptive management Plan for nexplanon removal at next appointment  Start oral contraceptive

## 2017-06-19 NOTE — Patient Instructions (Signed)
Please schedule an appointment for Nexplanon removal

## 2017-06-22 NOTE — Assessment & Plan Note (Signed)
Plan for nexplanon removal at next appointment  Start oral contraceptive

## 2017-06-29 ENCOUNTER — Ambulatory Visit (INDEPENDENT_AMBULATORY_CARE_PROVIDER_SITE_OTHER): Payer: Medicaid Other | Admitting: Internal Medicine

## 2017-06-29 ENCOUNTER — Encounter: Payer: Self-pay | Admitting: Internal Medicine

## 2017-06-29 VITALS — BP 112/80 | HR 89 | Temp 98.0°F | Ht 62.0 in | Wt 307.0 lb

## 2017-06-29 DIAGNOSIS — Z3042 Encounter for surveillance of injectable contraceptive: Secondary | ICD-10-CM | POA: Diagnosis present

## 2017-06-29 DIAGNOSIS — Z308 Encounter for other contraceptive management: Secondary | ICD-10-CM | POA: Diagnosis not present

## 2017-06-29 DIAGNOSIS — Z3046 Encounter for surveillance of implantable subdermal contraceptive: Secondary | ICD-10-CM

## 2017-06-29 DIAGNOSIS — Z3202 Encounter for pregnancy test, result negative: Secondary | ICD-10-CM | POA: Diagnosis not present

## 2017-06-29 LAB — POCT URINE PREGNANCY: PREG TEST UR: NEGATIVE

## 2017-06-29 MED ORDER — MEDROXYPROGESTERONE ACETATE 150 MG/ML IM SUSP
150.0000 mg | Freq: Once | INTRAMUSCULAR | Status: AC
  Administered 2017-06-29: 150 mg via INTRAMUSCULAR

## 2017-06-29 NOTE — Progress Notes (Signed)
U

## 2017-06-29 NOTE — Progress Notes (Signed)
   [facility] Phone: [phone]  Subjective:  Veronica [name] is a 26 year old female presenting to clinic for Nexplanon removal and to discuss birth control options.  Nexplanon removal: Nexplanon expired in May 2018.  Birth Control: Unsure what method she wants to use. Does not want to have Nexplanon replaced because she feels that it has caused her hair to fall out. She does not want to get pregnant. She is sexually active with one female partner. Wants more time to figure out what birth control she wants. Would like to have Depo in the meantime.  ROS: See HPI for pertinent positives and negatives  Past Medical History- obesity, anemia  Family history reviewed for today's visit. No changes.  Social history- patient is a current smoker  Objective: BP 112/80   Pulse 89   Temp 98 F (36.7 C) (Oral)   Ht 5\' 2"  (1.575 m)   Wt (!) 307 lb (139.3 kg)   LMP 06/11/2017   SpO2 95%   BMI 56.15 kg/m  Gen: NAD, alert, cooperative with exam Msk: Nexplanon located superficially under the skin and is easily palpated, no overlying erythema or masses  Assessment/Plan: Nexplanon removal: Removed in clinic today. See procedure note below.  Birth control counseling: Discussed different birth control options. Advised IUD because patient is obese and is a current smoker.  - Pregnancy test negative today - Wants more time to decide what birth control to use. Depo injection given today. - Follow-up with PCP to discuss further.  PROCEDURE NOTE: NEXPLANON REMOVAL Patient given informed consent and signed copy in the chart. Left arm area prepped and draped in the usual sterile fashion. Two cc of lidocaine without epinephrine 1% used for local anesthesia. A small stab incision was made close to the nexplanon with scalpel. Hemostats were used to withdraw the nexplanon. A small bandage was applied over a steri strip  No complications.Patient given follow up instructions should she  experience redness, swelling at sight or fever in the next 24 hours. Patient was reminded this totally removes her nexplanon contraceptive devise.    Willadean CarolKaty Evanny Ellerbe, MD PGY-3

## 2017-06-29 NOTE — Patient Instructions (Signed)
It was so nice to meet you!  We removed your Nexplanon today. If you have any spreading redness, fevers, or chills, please come back to see us!  Please follow-up with your PCP in the next 3 months to discuss birth control.  -Dr. Nancy MarusMayo

## 2017-06-29 NOTE — Assessment & Plan Note (Signed)
Discussed different birth control options. Advised IUD because patient is obese and is a current smoker.  - Pregnancy test negative today - Wants more time to decide what birth control to use. Depo injection given today. - Follow-up with PCP to discuss further.

## 2017-07-16 ENCOUNTER — Emergency Department (HOSPITAL_COMMUNITY)
Admission: EM | Admit: 2017-07-16 | Discharge: 2017-07-16 | Disposition: A | Discharge status: Home / Self Care | Payer: Self-pay | Attending: Emergency Medicine | Admitting: Emergency Medicine

## 2017-07-16 ENCOUNTER — Encounter (HOSPITAL_COMMUNITY): Payer: Self-pay | Admitting: Emergency Medicine

## 2017-07-16 ENCOUNTER — Emergency Department (HOSPITAL_COMMUNITY): Payer: Self-pay

## 2017-07-16 ENCOUNTER — Other Ambulatory Visit: Payer: Self-pay

## 2017-07-16 DIAGNOSIS — R0789 Other chest pain: Secondary | ICD-10-CM | POA: Insufficient documentation

## 2017-07-16 DIAGNOSIS — R55 Syncope and collapse: Secondary | ICD-10-CM | POA: Insufficient documentation

## 2017-07-16 DIAGNOSIS — Z79899 Other long term (current) drug therapy: Secondary | ICD-10-CM | POA: Insufficient documentation

## 2017-07-16 LAB — CBC
HEMATOCRIT: 35.2 % — AB (ref 36.0–46.0)
HEMOGLOBIN: 11.9 g/dL — AB (ref 12.0–15.0)
MCH: 27.9 pg (ref 26.0–34.0)
MCHC: 33.8 g/dL (ref 30.0–36.0)
MCV: 82.4 fL (ref 78.0–100.0)
Platelets: 356 10*3/uL (ref 150–400)
RBC: 4.27 MIL/uL (ref 3.87–5.11)
RDW: 13.9 % (ref 11.5–15.5)
WBC: 8.4 10*3/uL (ref 4.0–10.5)

## 2017-07-16 LAB — BASIC METABOLIC PANEL
ANION GAP: 4 — AB (ref 5–15)
BUN: 9 mg/dL (ref 6–20)
CO2: 23 mmol/L (ref 22–32)
Calcium: 8.5 mg/dL — ABNORMAL LOW (ref 8.9–10.3)
Chloride: 109 mmol/L (ref 101–111)
Creatinine, Ser: 0.93 mg/dL (ref 0.44–1.00)
GLUCOSE: 100 mg/dL — AB (ref 65–99)
POTASSIUM: 4.1 mmol/L (ref 3.5–5.1)
Sodium: 136 mmol/L (ref 135–145)

## 2017-07-16 LAB — POCT I-STAT TROPONIN I
TROPONIN I, POC: 0 ng/mL (ref 0.00–0.08)
TROPONIN I, POC: 0 ng/mL (ref 0.00–0.08)

## 2017-07-16 LAB — POCT PREGNANCY, URINE: PREG TEST UR: NEGATIVE

## 2017-07-16 LAB — D-DIMER, QUANTITATIVE (NOT AT ARMC): D DIMER QUANT: 0.77 ug{FEU}/mL — AB (ref 0.00–0.50)

## 2017-07-16 MED ORDER — KETOROLAC TROMETHAMINE 30 MG/ML IJ SOLN
15.0000 mg | Freq: Once | INTRAMUSCULAR | Status: AC
  Administered 2017-07-16: 15 mg via INTRAVENOUS
  Filled 2017-07-16: qty 1

## 2017-07-16 MED ORDER — SODIUM CHLORIDE 0.9 % IV BOLUS (SEPSIS)
1000.0000 mL | Freq: Once | INTRAVENOUS | Status: AC
  Administered 2017-07-16: 1000 mL via INTRAVENOUS

## 2017-07-16 MED ORDER — GI COCKTAIL ~~LOC~~
30.0000 mL | Freq: Once | ORAL | Status: AC
  Administered 2017-07-16: 30 mL via ORAL
  Filled 2017-07-16: qty 30

## 2017-07-16 MED ORDER — OMEPRAZOLE 20 MG PO CPDR: 20.0000 mg | DELAYED_RELEASE_CAPSULE | Freq: Every day | ORAL | 0 refills | Status: DC

## 2017-07-16 MED ORDER — IOPAMIDOL (ISOVUE-370) INJECTION 76%
100.0000 mL | Freq: Once | INTRAVENOUS | Status: AC | PRN
  Administered 2017-07-16: 100 mL via INTRAVENOUS

## 2017-07-16 MED ORDER — IOPAMIDOL (ISOVUE-370) INJECTION 76%
INTRAVENOUS | Status: AC
  Filled 2017-07-16: qty 100

## 2017-07-16 NOTE — ED Notes (Signed)
Patient transported to CT 

## 2017-07-16 NOTE — ED Triage Notes (Signed)
Pt states she has had a headache with sharp chest pain since last night. Alert and oriented.

## 2017-07-16 NOTE — ED Provider Notes (Signed)
[facility]-EMERGENCY DEPT Provider Note   CSN: [id number] Arrival date & time: 07/16/17  1248  By signing my name below, I, Rosario Adie, attest that this documentation has been prepared under the direction and in the presence of Demetrios Loll, [name].  Electronically Signed: Rosario Adie, ED Scribe. 07/16/17. 3:26 PM.  History   Chief Complaint Chief Complaint  Patient presents with  . Headache  . Chest Pain   The history is provided by the patient. No language interpreter was used.    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 Pt reports that last night she had [place] chest pain while at rest; today while on the way to work her pain had acutely worsened with a new onset of headache. She reports some associated nausea and shortness of breath as well. She describes her chest pain as sharp and she states that it is mostly present in her left-sided chest and in the sternal area. Additionally, she reports that while at work she had a syncopal episode which lasted for only a few seconds. No head trauma. Pt was standing at the time of onset and prodromally she felt her symptoms come on suddenly before losing consciousness. She reports that since this and while in the ED she is feeling towards her baseline and only has a mild headache. Her chest pain has now resolved. Her pain was worse with deep inspirations. She is currently on Depo Provera. No h/o PE/DVT, recent long travel, surgery, fracture, prolonged immobilization. No h/o prior cardiac events. Of note, her mother was dx'd w/ CHF at the age of 38yo. No recent illnesses/infections. She denies leg swelling, leg pain, calf pain, cough, congestion, fever, vomiting, or any other associated  symptoms.   Past Medical History:  Diagnosis Date  . Anemia   . Ankle pain   . GERD (gastroesophageal reflux disease)   . Hiatal hernia   . Obesity (BMI 30-39.9)    Patient Active Problem List   Diagnosis Date Noted  . Redness of both eyes 09/24/2016  . Neuropathic pain 05/10/2016  . Fatigue 05/09/2016  . Polyuria 05/09/2016  . Vaginal yeast infection 03/07/2016  . Diarrhea 04/20/2015  . Bacterial vaginosis 01/19/2014  . Morbid obesity (HCC) 08/25/2013  . Potential exposure to STD 01/13/2013  . Dysuria 01/13/2013  . Cerumen impaction 10/06/2012  . Eyelid laceration, right 01/29/2012  . Abdominal pain 01/06/2012  . Healthcare maintenance 10/20/2011  . Pelvic pain in female 10/20/2011  . Headache(784.0) 08/26/2011  . Birth control counseling 08/26/2011  . Numbness 08/26/2011  . Acne 03/28/2011  . GERD (gastroesophageal reflux disease) 03/28/2011  . Obesity (BMI 30-39.9) 02/27/2011  . Anemia 02/27/2011   Past Surgical History:  Procedure Laterality Date  . CESAREAN SECTION  09/02/2010   For macrosomia, but son was 7 lb 12 oz  . TONSILLECTOMY AND ADENOIDECTOMY     OB History    No data available     Home Medications    Prior to Admission medications   Medication Sig Start Date End Date Taking? Authorizing Provider  acetaminophen (TYLENOL) 500 MG tablet Take 1 tablet (500 mg total) by mouth every 6 (six) hours as needed. 07/12/16   Dan Humphreys, MD  artificial tears (LACRILUBE) OINT ophthalmic ointment Place into the right eye every  4 (four) hours as needed for dry eyes. 09/27/16   Lawyer, Cristal Deer, [name]  esomeprazole (NEXIUM) 40 MG capsule Take 40 mg by mouth daily at 12 noon.    [provider]  etonogestrel (NEXPLANON) 68 MG IMPL implant 1 each by Subdermal route once.    [provider]  Fe Fum-FePoly-FA-Vit C-Vit B3 (INTEGRA F) 125-1 MG CAPS Take 1 capsule by mouth daily. 04/29/17   Mikell, Antionette Poles, MD  fluticasone (FLONASE) 50 MCG/ACT  nasal spray Place 2 sprays into both nostrils daily. 06/01/17   Joanna Puff, MD  ibuprofen (ADVIL,MOTRIN) 600 MG tablet Take 1 tablet (600 mg total) by mouth every 6 (six) hours as needed. 07/12/16   Dan Humphreys, MD   Family History Family History  Problem Relation Age of Onset  . Diabetes Mother   . Hypertension Mother   . Obesity Mother   . Obesity Sister   . Osteochondroma Sister   . Diabetes Sister   . Hypertension Sister    Social History Social History  Substance Use Topics  . Smoking status: Never Smoker  . Smokeless tobacco: Never Used  . Alcohol use No   Allergies   Morphine and related  Review of Systems Review of Systems  Constitutional: Negative for fever.  HENT: Negative for congestion.   Respiratory: Positive for shortness of breath. Negative for cough.   Cardiovascular: Positive for chest pain. Negative for leg swelling.  Gastrointestinal: Positive for nausea. Negative for vomiting.  Neurological: Positive for syncope and headaches.  All other systems reviewed and are negative.  Physical Exam Updated Vital Signs BP (!) 141/82 (BP Location: Right Arm)   Pulse 78   Temp 98.4 F (36.9 C) (Oral)   Resp 18   LMP 06/07/2017 (Approximate)   SpO2 100%   Physical Exam  Constitutional: She appears well-developed and well-nourished. No distress.  HENT:  Head: Normocephalic and atraumatic.  Eyes: Conjunctivae are normal.  Neck: Normal range of motion. No JVD present. No tracheal deviation present.  Cardiovascular: Normal rate, regular rhythm, normal heart sounds and intact distal pulses.  Exam reveals no gallop and no friction rub.   No murmur heard. Pulmonary/Chest: Effort normal and breath sounds normal. No respiratory distress. She has no wheezes. She has no rales.  Abdominal: She exhibits no distension.  Musculoskeletal: Normal range of motion. She exhibits no edema or tenderness.  Neurological: She is alert.  The patient is alert, attentive, and  oriented x 3. Speech is clear. Cranial nerve II-VII grossly intact. Negative pronator drift. Sensation intact. Strength 5/5 in all extremities. Reflexes 2+ and symmetric at biceps, triceps, knees, and ankles. Rapid alternating movement and fine finger movements intact. Romberg is absent. Posture and gait normal.  Skin: No pallor.  Psychiatric: She has a normal mood and affect. Her behavior is normal.  Nursing note and vitals reviewed.  ED Treatments / Results  DIAGNOSTIC STUDIES: Oxygen Saturation is 100% on RA, normal by my interpretation.   COORDINATION OF CARE: 3:26 PM-Discussed next steps with pt. Pt verbalized understanding and is agreeable with the plan.   Labs (all labs ordered are listed, but only abnormal results are displayed) Labs Reviewed  BASIC METABOLIC PANEL - Abnormal; Notable for the following:       Result Value   Glucose, Bld 100 (*)    Calcium 8.5 (*)    Anion gap 4 (*)    All other components within normal limits  CBC - Abnormal; Notable for the following:  Hemoglobin 11.9 (*)    HCT 35.2 (*)    All other components within normal limits  D-DIMER, QUANTITATIVE (NOT AT [facility]) - Abnormal; Notable for the following:    D-Dimer, Quant 0.77 (*)    All other components within normal limits  I-STAT TROPONIN, ED  POCT I-STAT TROPONIN I  POC URINE PREG, ED  POCT PREGNANCY, URINE  I-STAT TROPONIN, ED  POCT I-STAT TROPONIN I   EKG  EKG Interpretation None      Radiology Dg Chest 2 View  Result Date: 07/16/2017 CLINICAL DATA:  Acute chest pain and shortness of breath for 1 day. EXAM: CHEST  2 VIEW COMPARISON:  01/18/2013 chest radiograph FINDINGS: The cardiomediastinal silhouette is unremarkable. There is no evidence of focal airspace disease, pulmonary edema, suspicious pulmonary nodule/mass, pleural effusion, or pneumothorax. No acute bony abnormalities are identified. IMPRESSION: No active cardiopulmonary disease. Electronically Signed   By: Harmon Pier M.D.    On: 07/16/2017 13:16   Ct Angio Chest Pe W/cm &/or Wo Cm  Result Date: 07/16/2017 CLINICAL DATA:  Acute onset left head chest pain, shortness breath, and syncopal episode today. Clinical suspicion for pulmonary embolism. EXAM: CT ANGIOGRAPHY CHEST WITH CONTRAST TECHNIQUE: Multidetector CT imaging of the chest was performed using the standard protocol during bolus administration of intravenous contrast. Multiplanar CT image reconstructions and MIPs were obtained to evaluate the vascular anatomy. CONTRAST:  100 mL Isovue 370 COMPARISON:  None. FINDINGS: Cardiovascular: Satisfactory opacification of pulmonary arteries noted, and no pulmonary emboli identified. No evidence of thoracic aortic dissection or aneurysm. Mediastinum/Nodes: No masses or pathologically enlarged lymph nodes identified. Residual thymic tissue noted in the anterior mediastinum. Lungs/Pleura: No pulmonary mass, infiltrate, or effusion. Upper abdomen: Small hiatal hernia incidentally noted. Musculoskeletal: No suspicious bone lesions identified. Review of the MIP images confirms the above findings. IMPRESSION: No evidence of pulmonary embolism or other acute findings. Small hiatal hernia. Electronically Signed   By: Myles Rosenthal M.D.   On: 07/16/2017 19:26   Procedures Procedures   Medications Ordered in ED Medications  ketorolac (TORADOL) 30 MG/ML injection 15 mg (not administered)  sodium chloride 0.9 % bolus 1,000 mL (not administered)  gi cocktail (Maalox,Lidocaine,Donnatal) (30 mLs Oral Given 07/16/17 1515)   Initial Impression / Assessment and Plan / ED Course  I have reviewed the triage vital signs and the nursing notes.  Pertinent labs & imaging results that were available during my care of the patient were reviewed by me and considered in my medical decision making (see chart for details).     Pt is a 26 y.o. female presents with CP beginning last night, acutely worsening this afternoon with subsequent episode of syncope.  Given toradol, GI cocktail, and fluid bolus in ED. Patient is to be discharged with recommendation to follow up with PCP in regards to today's hospital visit. Chest pain is not likely of cardiac or pulmonary etiology d/t presentation,, VSS, no tracheal deviation, no JVD or new murmur, RRR, breath sounds equal bilaterally, EKG without any signs of ischemia, negative troponin x2, and negative CXR.   D/t her pain being somewhat pleuritic in nature and with her episode of syncope a d-dimer was ordered which was positive. CTA chest was performed which was negative.    Unsure etiology of syncopal episode. Patient is not orthostatic. Given fluid bolus. Headache improved with Toradol and fluid. No focal neuro deficits. Will need close follow up PCP.  Heart Score: 1. Pt has been advised start a PPI and return  to the ED is CP becomes exertional, associated with diaphoresis or nausea, radiates to left jaw/arm, worsens or becomes concerning in any way. Pt appears reliable for follow up and is agreeable to discharge. Patient is in no acute distress. Vital Signs are stable. Patient is able to ambulate. Patient able to tolerate PO.    Final Clinical Impressions(s) / ED Diagnoses   Final diagnoses:  Atypical chest pain  Syncope, unspecified syncope type   New Prescriptions New Prescriptions   OMEPRAZOLE (PRILOSEC) 20 MG CAPSULE    Take 1 capsule (20 mg total) by mouth daily.   I personally performed the services described in this documentation, which was scribed in my presence. The recorded information has been reviewed and is accurate.     Rise MuLeaphart, Natthew Marlatt T, [name] 07/16/17 1940    Nira Connardama, Pedro Eduardo, MD 07/17/17 0130

## 2017-07-16 NOTE — ED Notes (Signed)
Veronica [name] in lab reports D-Dimer is received and in progress.

## 2017-07-16 NOTE — ED Notes (Signed)
Family at bedside. 

## 2017-07-16 NOTE — Discharge Instructions (Signed)
Your imaging and labs were normal. No signs of blood clot. Unknown cause of your chest pain and syncopal episode. It is important that she follow-up with her primary care doctor for further evaluation by specialty including possibly cardiology or neurology. Drink plenty of fluids. Take the Prilosec as needed for acid reflux once a day. If your symptoms worsen return to the ED.

## 2017-07-16 NOTE — ED Notes (Signed)
Pt reports syncopal episode while at work today.  Reports intermittent cp and constant mild h/a.  Pt is A&Ox 4.  Ambulatory without difficulty.  Denies any cp at this time.

## 2017-07-16 NOTE — ED Notes (Signed)
Pt reports feeling much better, reason for delay explained to pt.

## 2017-10-09 ENCOUNTER — Emergency Department (HOSPITAL_COMMUNITY): Payer: Self-pay

## 2017-10-09 ENCOUNTER — Encounter (HOSPITAL_COMMUNITY): Payer: Self-pay | Admitting: Emergency Medicine

## 2017-10-09 ENCOUNTER — Emergency Department (HOSPITAL_COMMUNITY)
Admission: EM | Admit: 2017-10-09 | Discharge: 2017-10-09 | Disposition: A | Discharge status: Home / Self Care | Payer: Self-pay | Attending: Emergency Medicine | Admitting: Emergency Medicine

## 2017-10-09 DIAGNOSIS — F458 Other somatoform disorders: Secondary | ICD-10-CM | POA: Insufficient documentation

## 2017-10-09 DIAGNOSIS — M940 Chondrocostal junction syndrome [Tietze]: Secondary | ICD-10-CM | POA: Insufficient documentation

## 2017-10-09 DIAGNOSIS — R0989 Other specified symptoms and signs involving the circulatory and respiratory systems: Secondary | ICD-10-CM

## 2017-10-09 DIAGNOSIS — R0789 Other chest pain: Secondary | ICD-10-CM

## 2017-10-09 DIAGNOSIS — R590 Localized enlarged lymph nodes: Secondary | ICD-10-CM | POA: Insufficient documentation

## 2017-10-09 DIAGNOSIS — Z87891 Personal history of nicotine dependence: Secondary | ICD-10-CM | POA: Insufficient documentation

## 2017-10-09 DIAGNOSIS — J069 Acute upper respiratory infection, unspecified: Secondary | ICD-10-CM | POA: Insufficient documentation

## 2017-10-09 DIAGNOSIS — R05 Cough: Secondary | ICD-10-CM | POA: Insufficient documentation

## 2017-10-09 DIAGNOSIS — Z79899 Other long term (current) drug therapy: Secondary | ICD-10-CM | POA: Insufficient documentation

## 2017-10-09 DIAGNOSIS — Z885 Allergy status to narcotic agent status: Secondary | ICD-10-CM | POA: Insufficient documentation

## 2017-10-09 DIAGNOSIS — B9789 Other viral agents as the cause of diseases classified elsewhere: Secondary | ICD-10-CM | POA: Insufficient documentation

## 2017-10-09 DIAGNOSIS — R6889 Other general symptoms and signs: Secondary | ICD-10-CM | POA: Insufficient documentation

## 2017-10-09 LAB — BASIC METABOLIC PANEL
Anion gap: 6 (ref 5–15)
BUN: 5 mg/dL — AB (ref 6–20)
CALCIUM: 8.5 mg/dL — AB (ref 8.9–10.3)
CO2: 23 mmol/L (ref 22–32)
Chloride: 108 mmol/L (ref 101–111)
Creatinine, Ser: 0.91 mg/dL (ref 0.44–1.00)
GFR calc Af Amer: >60 mL/min (ref >60)
Glucose, Bld: 92 mg/dL (ref 65–99)
POTASSIUM: 3.7 mmol/L (ref 3.5–5.1)
SODIUM: 137 mmol/L (ref 135–145)

## 2017-10-09 LAB — I-STAT TROPONIN, ED: Troponin i, poc: 0.01 ng/mL (ref 0.00–0.08)

## 2017-10-09 LAB — I-STAT BETA HCG BLOOD, ED (MC, WL, AP ONLY)

## 2017-10-09 LAB — CBC
HEMATOCRIT: 36 % (ref 36.0–46.0)
Hemoglobin: 11.9 g/dL — ABNORMAL LOW (ref 12.0–15.0)
MCH: 27.9 pg (ref 26.0–34.0)
MCHC: 33.1 g/dL (ref 30.0–36.0)
MCV: 84.3 fL (ref 78.0–100.0)
PLATELETS: 329 10*3/uL (ref 150–400)
RBC: 4.27 MIL/uL (ref 3.87–5.11)
RDW: 14.1 % (ref 11.5–15.5)
WBC: 8.6 10*3/uL (ref 4.0–10.5)

## 2017-10-09 MED ORDER — ALBUTEROL SULFATE HFA 108 (90 BASE) MCG/ACT IN AERS: 2.0000 | INHALATION_SPRAY | RESPIRATORY_TRACT | 0 refills | Status: DC | PRN

## 2017-10-09 MED ORDER — ALBUTEROL SULFATE HFA 108 (90 BASE) MCG/ACT IN AERS
2.0000 | INHALATION_SPRAY | Freq: Once | RESPIRATORY_TRACT | Status: AC
  Administered 2017-10-09: 2 via RESPIRATORY_TRACT
  Filled 2017-10-09: qty 6.7

## 2017-10-09 NOTE — ED Notes (Signed)
AVS reviewed with Pt. Verbalized understanding.

## 2017-10-09 NOTE — ED Triage Notes (Signed)
Pt states 4 days of URI like symptoms, sore throat, cough, runny nose. STates she is concerned because she "gags" when coughing a lot. Also states right sided neck pain, and centralized chest pain radiating into left breast that started 3 days ago.

## 2017-10-09 NOTE — Discharge Instructions (Signed)
Continue to stay well-hydrated. Gargle warm salt water and spit it out. Use chloraseptic spray as needed for sore throat. Continue to alternate between Tylenol and Ibuprofen for pain or fever. Use heat or vicks vaporub to your chest to help with pain. Use Mucinex for cough suppression/expectoration of mucus. Use netipot and flonase to help with nasal congestion. May consider over-the-counter Benadryl or other antihistamine to decrease secretions and for help with your symptoms. Use inhaler as directed, as needed for cough/chest congestion/wheezing/shortness of breath. Follow up with your primary care doctor in 5-7 days for recheck of ongoing symptoms. Return to emergency department for emergent changing or worsening of symptoms.

## 2017-10-09 NOTE — ED Provider Notes (Signed)
[facility] EMERGENCY DEPARTMENT Provider Note   CSN: [id number] Arrival date & time: 10/09/17  1308     History   Chief Complaint Chief Complaint  Patient presents with  . Chest Pain  . URI  . Cough    HPI Veronica [name] is a 26 y.o. female with a PMHx of anemia, GERD, hiatal hernia, neuropathic pain, and headaches, who presents to the ED with complaints of URI symptoms and CP 3 days. Symptoms include sore throat, globus sensation in the throat, sinus congestion, chills, cough with white sputum production, wheezing, body aches, and 2 episodes daily of nonbloody nonbilious posttussive emesis due to coughing so hard that she gags on the phlegm. She also endorses 6/10 constant aching nonradiating anterior chest wall pain, worse with coughing, and unrelieved with NyQuil, ibuprofen, Delsym, and honey. No known sick contacts. No known history of asthma or COPD. She is a former smoker who quit 2 months ago (but states she only smoked about 1 cigarette a week). Positive family history of CHF and ?MI in mother (states autopsy found "a clot in her heart" but pt isn't sure exactly what type of clot it was). Her PCP is Dr. Cathlean Cower at [facility].   She denies rhinorrhea, drooling, trismus, ear pain/drainage, diaphoresis, lightheadedness, fevers, hemoptysis, SOB, LE swelling, recent travel/surgery/immobilization, estrogen use, personal/family hx of DVT/PE, abd pain, nausea, diarrhea, constipation, hematemesis, hematuria, dysuria, vaginal bleeding/discharge, arthralgias, claudication, orthopnea, numbness, tingling, focal weakness, or any other complaints at this time.  Of note, chart review reveals that she was seen in the ED for CP on 07/16/17, had elevated D-dimer so underwent a CTA chest which was neg for PE; the remainder of her work up was reassuring.    The history is provided by the patient and medical records. No language interpreter was used.  Chest Pain     This is a new problem. The current episode started more than 2 days ago. The problem occurs constantly. The problem has not changed since onset.The pain is associated with coughing. The pain is present in the substernal region. The pain is at a severity of 6/10. The pain is mild. Quality: aching. The pain does not radiate. Duration of episode(s) is 3 days. Exacerbated by: coughing. Associated symptoms include cough and vomiting (posttussive, 2x/d, NBNB). Pertinent negatives include no abdominal pain, no claudication, no diaphoresis, no fever, no hemoptysis, no lower extremity edema, no nausea, no numbness, no orthopnea, no shortness of breath and no weakness. Treatments tried: nyquil, ibuprofen, delsym, and honey. The treatment provided no relief. Risk factors include obesity.  Pertinent negatives for past medical history include no CAD, no COPD, no diabetes, no DVT, no hyperlipidemia, no hypertension, no MI and no PE.  Her family medical history is significant for CAD.  Pertinent negatives for family medical history include: no PE.  URI   Associated symptoms include chest pain, vomiting (posttussive, 2x/d, NBNB), congestion, sore throat, cough and wheezing. Pertinent negatives include no abdominal pain, no diarrhea, no nausea, no dysuria, no ear pain and no rhinorrhea.  Cough  Associated symptoms include chest pain, chills, sore throat, myalgias (body aches) and wheezing. Pertinent negatives include no ear pain, no rhinorrhea and no shortness of breath.    Past Medical History:  Diagnosis Date  . Anemia   . Ankle pain   . GERD (gastroesophageal reflux disease)   . Hiatal hernia   . Obesity (BMI 30-39.9)     Patient Active Problem List  Diagnosis Date Noted  . Redness of both eyes 09/24/2016  . Neuropathic pain 05/10/2016  . Fatigue 05/09/2016  . Polyuria 05/09/2016  . Vaginal yeast infection 03/07/2016  . Diarrhea 04/20/2015  . Bacterial vaginosis 01/19/2014  . Morbid obesity (HCC)  08/25/2013  . Potential exposure to STD 01/13/2013  . Dysuria 01/13/2013  . Cerumen impaction 10/06/2012  . Eyelid laceration, right 01/29/2012  . Abdominal pain 01/06/2012  . Healthcare maintenance 10/20/2011  . Pelvic pain in female 10/20/2011  . Headache(784.0) 08/26/2011  . Birth control counseling 08/26/2011  . Numbness 08/26/2011  . Acne 03/28/2011  . GERD (gastroesophageal reflux disease) 03/28/2011  . Obesity (BMI 30-39.9) 02/27/2011  . Anemia 02/27/2011    Past Surgical History:  Procedure Laterality Date  . CESAREAN SECTION  09/02/2010   For macrosomia, but son was 7 lb 12 oz  . TONSILLECTOMY AND ADENOIDECTOMY      OB History    No data available       Home Medications    Prior to Admission medications   Medication Sig Start Date End Date Taking? Authorizing Provider  acetaminophen (TYLENOL) 500 MG tablet Take 1 tablet (500 mg total) by mouth every 6 (six) hours as needed. 07/12/16   Dan HumphreysIrick, Michael, MD  artificial tears (LACRILUBE) OINT ophthalmic ointment Place into the right eye every 4 (four) hours as needed for dry eyes. 09/27/16   Lawyer, Cristal Deerhristopher, [name]  esomeprazole (NEXIUM) 40 MG capsule Take 40 mg by mouth daily at 12 noon.    [provider]  etonogestrel (NEXPLANON) 68 MG IMPL implant 1 each by Subdermal route once.    [provider]  Fe Fum-FePoly-FA-Vit C-Vit B3 (INTEGRA F) 125-1 MG CAPS Take 1 capsule by mouth daily. 04/29/17   Mikell, Antionette PolesAsiyah Zahra, MD  fluticasone (FLONASE) 50 MCG/ACT nasal spray Place 2 sprays into both nostrils daily. 06/01/17   Joanna Pufforsey, Crystal S, MD  ibuprofen (ADVIL,MOTRIN) 600 MG tablet Take 1 tablet (600 mg total) by mouth every 6 (six) hours as needed. 07/12/16   Dan HumphreysIrick, Michael, MD  omeprazole (PRILOSEC) 20 MG capsule Take 1 capsule (20 mg total) by mouth daily. 07/16/17   Rise MuLeaphart, Kenneth T, [name]    Family History Family History  Problem Relation Age of Onset  . Diabetes Mother   . Hypertension Mother   .  Obesity Mother   . Obesity Sister   . Osteochondroma Sister   . Diabetes Sister   . Hypertension Sister     Social History Social History  Substance Use Topics  . Smoking status: Never Smoker  . Smokeless tobacco: Never Used  . Alcohol use No     Allergies   Morphine and related   Review of Systems Review of Systems  Constitutional: Positive for chills. Negative for diaphoresis and fever.  HENT: Positive for congestion and sore throat. Negative for drooling, ear discharge, ear pain, rhinorrhea and trouble swallowing.        +globus sensation  Respiratory: Positive for cough and wheezing. Negative for hemoptysis and shortness of breath.        No hemoptysis  Cardiovascular: Positive for chest pain. Negative for orthopnea, claudication and leg swelling.  Gastrointestinal: Positive for vomiting (posttussive, 2x/d, NBNB). Negative for abdominal pain, constipation, diarrhea and nausea.  Genitourinary: Negative for dysuria, hematuria, vaginal bleeding and vaginal discharge.  Musculoskeletal: Positive for myalgias (body aches). Negative for arthralgias.  Skin: Negative for color change.  Allergic/Immunologic: Negative for immunocompromised state.  Neurological: Negative for weakness, light-headedness and  numbness.  Psychiatric/Behavioral: Negative for confusion.   All other systems reviewed and are negative for acute change except as noted in the HPI.    Physical Exam Updated Vital Signs BP 119/61   Pulse 80   Temp 99 F (37.2 C) (Oral)   Resp 18   Ht 5\' 2"  (1.575 m)   Wt (!) 139.3 kg (307 lb)   LMP 07/09/2017 (Approximate) Comment: irregular periods  SpO2 98%   BMI 56.15 kg/m   Physical Exam  Constitutional: She is oriented to person, place, and time. Vital signs are normal. She appears well-developed and well-nourished.  Non-toxic appearance. No distress.  Afebrile, nontoxic, NAD  HENT:  Head: Normocephalic and atraumatic.  Right Ear: Hearing, tympanic membrane,  external ear and ear canal normal.  Left Ear: Hearing, tympanic membrane, external ear and ear canal normal.  Nose: Mucosal edema present.  Mouth/Throat: Uvula is midline, oropharynx is clear and moist and mucous membranes are normal. No trismus in the jaw. No uvula swelling. Tonsils are 0 on the right. Tonsils are 0 on the left. No tonsillar exudate.  Ears are clear bilaterally. Nose congested. Oropharynx clear and moist, some postnasal drainage noted, no uvular swelling or deviation, no trismus or drooling, tonsils surgically absent, no oropharyngeal erythema, no exudates. Handling secretions well.   Eyes: Conjunctivae and EOM are normal. Right eye exhibits no discharge. Left eye exhibits no discharge.  Neck: Normal range of motion. Neck supple.  Cardiovascular: Normal rate, regular rhythm, normal heart sounds and intact distal pulses.  Exam reveals no gallop and no friction rub.   No murmur heard. RRR, nl s1/s2, no m/r/g, distal pulses intact, no pedal edema   Pulmonary/Chest: Effort normal and breath sounds normal. No respiratory distress. She has no decreased breath sounds. She has no wheezes. She has no rhonchi. She has no rales. She exhibits tenderness. She exhibits no crepitus, no deformity and no retraction.  CTAB in all lung fields, no w/r/r, no hypoxia or increased WOB, speaking in full sentences, SpO2 98% on RA Chest wall with diffuse anterior TTP without crepitus, deformities, or retractions   Abdominal: Soft. Normal appearance and bowel sounds are normal. She exhibits no distension. There is no tenderness. There is no rigidity, no rebound, no guarding, no CVA tenderness, no tenderness at McBurney's point and negative Murphy's sign.  Soft, NTND, +BS throughout, no r/g/r, neg murphy's, neg mcburney's, no CVA TTP   Musculoskeletal: Normal range of motion.  MAE x4 Strength and sensation grossly intact in all extremities Distal pulses intact Gait steady No pedal edema, neg homan's  bilaterally   Lymphadenopathy:    She has cervical adenopathy.  Shotty cervical LAD bilaterally  Neurological: She is alert and oriented to person, place, and time. She has normal strength. No sensory deficit.  Skin: Skin is warm, dry and intact. No rash noted.  Psychiatric: She has a normal mood and affect.  Nursing note and vitals reviewed.    ED Treatments / Results  Labs (all labs ordered are listed, but only abnormal results are displayed) Labs Reviewed  BASIC METABOLIC PANEL - Abnormal; Notable for the following:       Result Value   BUN 5 (*)    Calcium 8.5 (*)    All other components within normal limits  CBC - Abnormal; Notable for the following:    Hemoglobin 11.9 (*)    All other components within normal limits  I-STAT TROPONIN, ED  I-STAT BETA HCG BLOOD, ED ([facility], [facility], [facility]  ONLY)    EKG  EKG Interpretation  Date/Time:  Friday October 09 2017 13:08:29 EDT Ventricular Rate:  75 PR Interval:  170 QRS Duration: 78 QT Interval:  372 QTC Calculation: 415 R Axis:   64 Text Interpretation:  Normal sinus rhythm Normal ECG Confirmed by Jacalyn Lefevre [phone]) on 10/09/2017 4:13:55 PM       Radiology Dg Chest 2 View  Result Date: 10/09/2017 CLINICAL DATA:  Cough and sore throat.  Chest pain EXAM: CHEST  2 VIEW COMPARISON:  Chest radiograph July 16, 2017 and chest CT July 16, 2017 FINDINGS: Lungs are clear. Heart size and pulmonary vascularity are normal. No adenopathy. No pneumothorax. No bone lesions. IMPRESSION: No edema or consolidation. Electronically Signed   By: Bretta Bang III M.D.   On: 10/09/2017 13:59    Procedures Procedures (including critical care time)  Medications Ordered in ED Medications  albuterol (PROVENTIL HFA;VENTOLIN HFA) 108 (90 Base) MCG/ACT inhaler 2 puff (2 puffs Inhalation Given 10/09/17 1717)     Initial Impression / Assessment and Plan / ED Course  I have reviewed the triage vital signs and the nursing notes.  Pertinent labs  & imaging results that were available during my care of the patient were reviewed by me and considered in my medical decision making (see chart for details).     26 y.o. female here with URI symptoms x3 days, globus sensation, posttussive emesis, and flu like symptoms. Also c/o aching central chest pain. On exam, clear lung sounds, chest wall tenderness diffusely across anterior chest, no tachycardia or hypoxia, no pedal edema, mild postnasal drainage and nasal congestion but otherwise throat clear, ears clear, shotty cervical LAD bilaterally, afebrile and nontoxic. Work up thus far reveals: EKG unremarkable and nonischemic, CXR neg, CBC with marginally low hgb 11.9 but otherwise WNL, BMP WNL, betaHCG neg, troponin neg. Symptoms likely related to viral URI, outside of window for tamiflu; will give inhaler here to see if this helps her symptoms, and likely d/c home with inhaler. Doubt need for further emergent work up at this time. Will reassess after inhaler.   5:41 PM Pt feeling slightly better after inhaler. Tolerating PO well. Advised use of this, will give rx for refill, and discussed other OTC remedies for symptomatic relief, heat use/tylenol/motrin/vicks vaporub use advised, and f/up with PCP in 1wk for recheck. I explained the diagnosis and have given explicit precautions to return to the ER including for any other new or worsening symptoms. The patient understands and accepts the medical plan as it's been dictated and I have answered their questions. Discharge instructions concerning home care and prescriptions have been given. The patient is STABLE and is discharged to home in good condition.    Final Clinical Impressions(s) / ED Diagnoses   Final diagnoses:  Viral URI with cough  Globus sensation  Chest wall pain  Costochondritis  Flu-like symptoms    New Prescriptions New Prescriptions   ALBUTEROL (PROVENTIL HFA;VENTOLIN HFA) 108 (90 BASE) MCG/ACT INHALER    Inhale 2 puffs into the  lungs every 4 (four) hours as needed for wheezing or shortness of breath (cough).     [address] [place], [place] 10/09/17 1742    Jacalyn Lefevre, MD 10/09/17 Windell Moment

## 2017-10-16 ENCOUNTER — Other Ambulatory Visit: Payer: Self-pay

## 2017-10-16 ENCOUNTER — Encounter: Payer: Self-pay | Admitting: Internal Medicine

## 2017-10-16 ENCOUNTER — Ambulatory Visit (INDEPENDENT_AMBULATORY_CARE_PROVIDER_SITE_OTHER): Payer: Self-pay | Admitting: Internal Medicine

## 2017-10-16 VITALS — BP 110/80 | HR 65 | Temp 98.6°F | Wt 310.0 lb

## 2017-10-16 DIAGNOSIS — R05 Cough: Secondary | ICD-10-CM

## 2017-10-16 DIAGNOSIS — R059 Cough, unspecified: Secondary | ICD-10-CM

## 2017-10-16 DIAGNOSIS — Z3202 Encounter for pregnancy test, result negative: Secondary | ICD-10-CM

## 2017-10-16 DIAGNOSIS — R053 Chronic cough: Secondary | ICD-10-CM | POA: Insufficient documentation

## 2017-10-16 DIAGNOSIS — N912 Amenorrhea, unspecified: Secondary | ICD-10-CM

## 2017-10-16 DIAGNOSIS — R1030 Lower abdominal pain, unspecified: Secondary | ICD-10-CM

## 2017-10-16 LAB — POCT URINE PREGNANCY: PREG TEST UR: NEGATIVE

## 2017-10-16 MED ORDER — POLYETHYLENE GLYCOL 3350 17 GM/SCOOP PO POWD: 17.0000 g | Freq: Two times a day (BID) | ORAL | 1 refills | Status: DC | PRN

## 2017-10-16 MED ORDER — BENZONATATE 100 MG PO CAPS: 100.0000 mg | ORAL_CAPSULE | Freq: Two times a day (BID) | ORAL | 0 refills | Status: DC | PRN

## 2017-10-16 NOTE — Patient Instructions (Addendum)
It was nice seeing you today.  I am going to check your lab work and do a right upper quadrant ultrasound for your abdominal pain.  I would also try doing MiraLAX to help with possible constipation.  I would continue using the albuterol inhaler I have also prescribed you some Tessalon Perles for cough. please follow-up with me in about 2 weeks.

## 2017-10-16 NOTE — Assessment & Plan Note (Signed)
History of chronic abdominal pain intermittent in nature worse after food intake patient associates this with nausea.  Worse with greasy foods.  This seems like cholelithiasis to me.  Patient is constipated which could be contributing.  Will check for pancreatitis though patient does not have a history of triglyceridimia or alcohol abuse.  Could be a peptic ulcer though patient is taking Nexium which would make this less likely. - POCT urine pregnancy - Comprehensive metabolic panel; Future - Lipase; Future - CBC; Future - US Abdomen Limited RUQ; Future - H.pylori screen, POC -Patient to follow-up in 2 weeks -Will have patient take MiraLAX and have a bowel movement every day during that time to see if there is any improvement -Discussed return precautions

## 2017-10-16 NOTE — Assessment & Plan Note (Addendum)
Patient with a recent viral URI.  Concerned she has asthma because she received an albuterol inhaler in the ED.  Likely patient with post viral URI symptoms causing her cough. -Will have patient continue albuterol inhaler as needed for cough and provide her with Jerilynn Somessalon Perles though this is likely to resolve on its own anyway -Follow-up if no improvement after 3-4 weeks

## 2017-10-16 NOTE — Progress Notes (Signed)
   [facility] Noralee CharsAsiyah Polk [place], [place] Phone: [phone]  Reason For Visit: Follow up   Viral URI  -Patient believes that she has been diagnosed in the ED with asthma.  She was seen for a viral URI and was prescribed albuterol inhaler which has helped significantly with her cough.  Patient previously had significant URI.  And is now having cough that is been ongoing for about 2 weeks. she states the cough is worse at night.  She does not have any wheezing.  Stomach Pain  - Intermittent pain, ongoing for about 6 months -States that it is associated with foods.  Pain is worse after eating meals especially greasy foods.  Pain sometimes radiates to the back.  Feels nauseated when this happens.  Patient has been taking Nexium.  She denies any radiation of pain or burning sensation in her esophagus. -She does have significant constipation and has a bowel movement every 3-4 days. -She does not have any history of ulcers.  Past Medical History Reviewed problem list.  Medications- reviewed and updated No additions to family history Social history- patient is a non- smoker  Objective: BP 110/80   Pulse 65   Temp 98.6 F (37 C) (Oral)   Wt (!) 310 lb (140.6 kg)   LMP  (LMP Unknown) Comment: pt is unsure- she is concerned about pregnancy   SpO2 99%   BMI 56.70 kg/m  Gen: NAD, alert, cooperative with exam Cardio: regular rate and rhythm, S1S2 heard, no murmurs appreciated Pulm: clear to auscultation bilaterally, no wheezes, rhonchi or rales GI: soft, non-tender, non-distended, slight tenderness epigastric and right upper quadrant, bowel sounds present, no hepatomegaly, no splenomegaly Skin: dry, intact, no rashes or lesions   Assessment/Plan: See problem based a/p  Abdominal pain History of chronic abdominal pain intermittent in nature worse after food intake patient associates this with nausea.  Worse with greasy foods.  This seems like cholelithiasis to me.  Patient  is constipated which could be contributing.  Will check for pancreatitis though patient does not have a history of triglyceridimia or alcohol abuse.  Could be a peptic ulcer though patient is taking Nexium which would make this less likely. - POCT urine pregnancy - Comprehensive metabolic panel; Future - Lipase; Future - CBC; Future - US Abdomen Limited RUQ; Future - H.pylori screen, POC -Patient to follow-up in 2 weeks -Will have patient take MiraLAX and have a bowel movement every day during that time to see if there is any improvement -Discussed return precautions  Cough Patient with a recent viral URI.  Concerned she has asthma because she received an albuterol inhaler in the ED.  Likely patient with post viral URI symptoms causing her cough. -Will have patient continue albuterol inhaler as needed for cough and provide her with Jerilynn Somessalon Perles though this is likely to resolve on its own anyway -Follow-up if no improvement after 3-4 weeks

## 2017-10-19 ENCOUNTER — Emergency Department (HOSPITAL_COMMUNITY)
Admission: EM | Admit: 2017-10-19 | Discharge: 2017-10-19 | Disposition: A | Discharge status: Home / Self Care | Payer: Medicaid Other | Attending: Emergency Medicine | Admitting: Emergency Medicine

## 2017-10-19 ENCOUNTER — Other Ambulatory Visit: Payer: Self-pay

## 2017-10-19 DIAGNOSIS — R1012 Left upper quadrant pain: Secondary | ICD-10-CM | POA: Insufficient documentation

## 2017-10-19 LAB — COMPREHENSIVE METABOLIC PANEL
ALT: 15 U/L (ref 14–54)
AST: 12 U/L — ABNORMAL LOW (ref 15–41)
Albumin: 3.6 g/dL (ref 3.5–5.0)
Alkaline Phosphatase: 67 U/L (ref 38–126)
Anion gap: 7 (ref 5–15)
BUN: 7 mg/dL (ref 6–20)
CO2: 26 mmol/L (ref 22–32)
Calcium: 9 mg/dL (ref 8.9–10.3)
Chloride: 103 mmol/L (ref 101–111)
Creatinine, Ser: 1.08 mg/dL — ABNORMAL HIGH (ref 0.44–1.00)
GFR calc Af Amer: >60 mL/min (ref >60)
GFR calc non Af Amer: >60 mL/min (ref >60)
Glucose, Bld: 91 mg/dL (ref 65–99)
Potassium: 3.1 mmol/L — ABNORMAL LOW (ref 3.5–5.1)
Sodium: 136 mmol/L (ref 135–145)
Total Bilirubin: 0.7 mg/dL (ref 0.3–1.2)
Total Protein: 7.4 g/dL (ref 6.5–8.1)

## 2017-10-19 LAB — CBC
HCT: 37.3 % (ref 36.0–46.0)
Hemoglobin: 12.5 g/dL (ref 12.0–15.0)
MCH: 28.2 pg (ref 26.0–34.0)
MCHC: 33.5 g/dL (ref 30.0–36.0)
MCV: 84 fL (ref 78.0–100.0)
Platelets: 330 10*3/uL (ref 150–400)
RBC: 4.44 MIL/uL (ref 3.87–5.11)
RDW: 14.1 % (ref 11.5–15.5)
WBC: 11.2 10*3/uL — ABNORMAL HIGH (ref 4.0–10.5)

## 2017-10-19 LAB — URINALYSIS, ROUTINE W REFLEX MICROSCOPIC
Bilirubin Urine: NEGATIVE
Glucose, UA: NEGATIVE mg/dL
Hgb urine dipstick: NEGATIVE
Ketones, ur: NEGATIVE mg/dL
Leukocytes, UA: NEGATIVE
Nitrite: NEGATIVE
Protein, ur: NEGATIVE mg/dL
Specific Gravity, Urine: 1.021 (ref 1.005–1.030)
pH: 6 (ref 5.0–8.0)

## 2017-10-19 LAB — PREGNANCY, URINE: Preg Test, Ur: NEGATIVE

## 2017-10-19 LAB — LIPASE, BLOOD: Lipase: 21 U/L (ref 11–51)

## 2017-10-19 MED ORDER — POTASSIUM CHLORIDE CRYS ER 20 MEQ PO TBCR: 20.0000 meq | EXTENDED_RELEASE_TABLET | Freq: Two times a day (BID) | ORAL | 0 refills | Status: DC

## 2017-10-19 MED ORDER — OMEPRAZOLE 20 MG PO CPDR: 20.0000 mg | DELAYED_RELEASE_CAPSULE | Freq: Two times a day (BID) | ORAL | 0 refills | Status: DC

## 2017-10-19 MED ORDER — GI COCKTAIL ~~LOC~~
30.0000 mL | Freq: Once | ORAL | Status: AC
  Administered 2017-10-19: 30 mL via ORAL
  Filled 2017-10-19: qty 30

## 2017-10-19 MED ORDER — SUCRALFATE 1 G PO TABS: 1.0000 g | ORAL_TABLET | Freq: Three times a day (TID) | ORAL | 0 refills | Status: DC

## 2017-10-19 NOTE — ED Notes (Signed)
Pt verbalizes understanding of d/c instructions. Pt received prescriptions. Pt ambulatory at d/c with all belongings.  

## 2017-10-19 NOTE — ED Triage Notes (Signed)
Per Pt, Pt is coming from home with complaints of left sided abdominal pain that started four days ago. No change in urinary symptoms, but reports soft stool.

## 2017-10-19 NOTE — ED Notes (Signed)
ED Provider at bedside. 

## 2017-10-19 NOTE — ED Provider Notes (Signed)
[facility] Provider Note   CSN: [id number] Arrival date & time: 10/19/17  1738     History   Chief Complaint Chief Complaint  Patient presents with  . Abdominal Pain    HPI Veronica [name] is a 26 y.o. female.  Patient with past medical history remarkable for GERD and hiatal hernia presents to the emergency department with a chief complaint of left upper abdominal pain.  She states the symptoms started 4 days ago.  She describes the pain is sharp and stabbing.  She states the symptoms are worsened after eating.  She denies any positional changes.  She reports associated nausea and vomiting, but denies any diarrhea.  She denies any fevers chills.  Denies any dysuria, hematuria, or new or unusual vaginal discharge or bleeding.  She denies any prior abdominal surgeries.  She denies any other associated symptoms.   The history is provided by the patient. No language interpreter was used.    Past Medical History:  Diagnosis Date  . Anemia   . Ankle pain   . GERD (gastroesophageal reflux disease)   . Hiatal hernia   . Obesity (BMI 30-39.9)     Patient Active Problem List   Diagnosis Date Noted  . Cough 10/16/2017  . Redness of both eyes 09/24/2016  . Neuropathic pain 05/10/2016  . Fatigue 05/09/2016  . Polyuria 05/09/2016  . Vaginal yeast infection 03/07/2016  . Diarrhea 04/20/2015  . Bacterial vaginosis 01/19/2014  . Morbid obesity (HCC) 08/25/2013  . Potential exposure to STD 01/13/2013  . Dysuria 01/13/2013  . Cerumen impaction 10/06/2012  . Eyelid laceration, right 01/29/2012  . Abdominal pain 01/06/2012  . Healthcare maintenance 10/20/2011  . Pelvic pain in female 10/20/2011  . Headache(784.0) 08/26/2011  . Birth control counseling 08/26/2011  . Numbness 08/26/2011  . Acne 03/28/2011  . GERD (gastroesophageal reflux disease) 03/28/2011  . Obesity (BMI 30-39.9) 02/27/2011  . Anemia 02/27/2011    Past Surgical  History:  Procedure Laterality Date  . CESAREAN SECTION  09/02/2010   For macrosomia, but son was 7 lb 12 oz  . TONSILLECTOMY AND ADENOIDECTOMY      OB History    No data available       Home Medications    Prior to Admission medications   Medication Sig Start Date End Date Taking? Authorizing Provider  acetaminophen (TYLENOL) 500 MG tablet Take 1 tablet (500 mg total) by mouth every 6 (six) hours as needed. Patient taking differently: Take 500 mg every 6 (six) hours as needed by mouth (for pain or headaches).  07/12/16  Yes Dan HumphreysIrick, Michael, MD  albuterol (PROVENTIL HFA;VENTOLIN HFA) 108 (90 Base) MCG/ACT inhaler Inhale 2 puffs into the lungs every 4 (four) hours as needed for wheezing or shortness of breath (cough). Patient taking differently: Inhale 2 puffs every 4 (four) hours as needed into the lungs (coughing, shortness of breath, or wheezing).  10/09/17  Yes Street, MoodusMercedes, [name]  Esomeprazole Magnesium (NEXIUM 24HR) 20 MG TBEC Take 20 mg daily before breakfast by mouth.   Yes [provider]  Fe Fum-FePoly-FA-Vit C-Vit B3 (INTEGRA F) 125-1 MG CAPS Take 1 capsule by mouth daily. 04/29/17  Yes Mikell, Antionette PolesAsiyah Zahra, MD  fluticasone (FLONASE) 50 MCG/ACT nasal spray Place 2 sprays into both nostrils daily. 06/01/17  Yes Joanna Pufforsey, Crystal S, MD  artificial tears (LACRILUBE) OINT ophthalmic ointment Place into the right eye every 4 (four) hours as needed for dry eyes. Patient not taking: Reported on  10/19/2017 09/27/16   Lawyer, Cristal Deer, [name]  benzonatate (TESSALON) 100 MG capsule Take 1 capsule (100 mg total) 2 (two) times daily as needed by mouth for cough. 10/16/17   Mikell, Antionette Poles, MD  ibuprofen (ADVIL,MOTRIN) 600 MG tablet Take 1 tablet (600 mg total) by mouth every 6 (six) hours as needed. Patient not taking: Reported on 10/19/2017 07/12/16   Dan Humphreys, MD  omeprazole (PRILOSEC) 20 MG capsule Take 1 capsule (20 mg total) by mouth daily. Patient not taking: Reported on  10/19/2017 07/16/17   Demetrios Loll T, [name]  polyethylene glycol powder (GLYCOLAX/MIRALAX) powder Take 17 g 2 (two) times daily as needed by mouth. 10/16/17   Berton Bon, MD    Family History Family History  Problem Relation Age of Onset  . Diabetes Mother   . Hypertension Mother   . Obesity Mother   . Obesity Sister   . Osteochondroma Sister   . Diabetes Sister   . Hypertension Sister     Social History Social History   Tobacco Use  . Smoking status: Never Smoker  . Smokeless tobacco: Never Used  Substance Use Topics  . Alcohol use: No  . Drug use: No     Allergies   Morphine and related and Motrin [ibuprofen]   Review of Systems Review of Systems  All other systems reviewed and are negative.    Physical Exam Updated Vital Signs BP 125/80   Pulse 85   Temp 98.9 F (37.2 C) (Oral)   Resp 18   Ht 5\' 2"  (1.575 m)   Wt (!) 142.4 kg (314 lb)   LMP 08/17/2017 (Within Weeks) Comment: pt is unsure- she is concerned about pregnancy   SpO2 99%   BMI 57.43 kg/m   Physical Exam  Constitutional: She is oriented to person, place, and time. She appears well-developed and well-nourished.  HENT:  Head: Normocephalic and atraumatic.  Eyes: Conjunctivae and EOM are normal. Pupils are equal, round, and reactive to light.  Neck: Normal range of motion. Neck supple.  Cardiovascular: Normal rate and regular rhythm. Exam reveals no gallop and no friction rub.  No murmur heard. Pulmonary/Chest: Effort normal and breath sounds normal. No respiratory distress. She has no wheezes. She has no rales. She exhibits no tenderness.  Abdominal: Soft. Bowel sounds are normal. She exhibits no distension and no mass. There is no tenderness. There is no rebound and no guarding.  Some tenderness in left upper quadrant  Musculoskeletal: Normal range of motion. She exhibits no edema or tenderness.  Neurological: She is alert and oriented to person, place, and time.  Skin: Skin is  warm and dry.  Psychiatric: She has a normal mood and affect. Her behavior is normal. Judgment and thought content normal.  Nursing note and vitals reviewed.    ED Treatments / Results  Labs (all labs ordered are listed, but only abnormal results are displayed) Labs Reviewed  COMPREHENSIVE METABOLIC PANEL - Abnormal; Notable for the following components:      Result Value   Potassium 3.1 (*)    Creatinine, Ser 1.08 (*)    AST 12 (*)    All other components within normal limits  CBC - Abnormal; Notable for the following components:   WBC 11.2 (*)    All other components within normal limits  LIPASE, BLOOD  URINALYSIS, ROUTINE W REFLEX MICROSCOPIC  PREGNANCY, URINE    EKG  EKG Interpretation None       Radiology No results found.  Procedures  Procedures (including critical care time)  Medications Ordered in ED Medications  gi cocktail (Maalox,Lidocaine,Donnatal) (30 mLs Oral Given 10/19/17 2106)     Initial Impression / Assessment and Plan / ED Course  I have reviewed the triage vital signs and the nursing notes.  Pertinent labs & imaging results that were available during my care of the patient were reviewed by me and considered in my medical decision making (see chart for details).    Patient with abdominal pain times 4 days.  The pain is located in the left upper quadrant.  It is worsened after eating.  She has no fever.  She has minimal left upper abdominal tenderness, but no other focal abdominal tenderness..  Review of prior charts show history of GERD and hiatal hernia.  She has taken Nexium and Prilosec in the past.  Otherwise, chart review is noncontributory.  DDx includes GERD, dyspepsia, peptic ulcer disease, gastritis, and pancreatitis.  Patient has no epigastric tenderness on exam.  Pain is not out of proportion to exam.  He has no vomiting.  She does lipase is normal.  Doubt pancreatitis.  LFTs are not elevated, no right upper quadrant abdominal  tenderness, doubt cholecystitis. Pregnancy test negative.  UA is normal.  Patient has history of GERD.  Could be related to this.  Could also be peptic ulcer disease.  Will give GI cocktail, and reassess.  Patient reports some mild improvement with GI cocktail, but does still have some pain.  VSS.  Will treat with PPI and carafate.  Close PCP follow-up advised and potentially follow-up with GI.  Discussed all findings with the patient, who understands and agrees with the plan.    Final Clinical Impressions(s) / ED Diagnoses   Final diagnoses:  Left upper quadrant pain    ED Discharge Orders        Ordered    omeprazole (PRILOSEC) 20 MG capsule  2 times daily before meals     10/19/17 2152    sucralfate (CARAFATE) 1 g tablet  3 times daily with meals & bedtime     10/19/17 2152    potassium chloride SA (K-DUR,KLOR-CON) 20 MEQ tablet  2 times daily     10/19/17 2154       Roxy HorsemanBrowning, Jjesus Dingley, [name] 10/19/17 2155    Raeford RazorKohut, Stephen, MD 10/20/17 1037

## 2017-10-20 ENCOUNTER — Ambulatory Visit (HOSPITAL_COMMUNITY)
Admission: RE | Admit: 2017-10-20 | Discharge: 2017-10-20 | Disposition: A | Discharge status: Home / Self Care | Payer: Medicaid Other | Source: Ambulatory Visit | Attending: Family Medicine | Admitting: Family Medicine

## 2017-10-20 ENCOUNTER — Other Ambulatory Visit (INDEPENDENT_AMBULATORY_CARE_PROVIDER_SITE_OTHER): Payer: Self-pay

## 2017-10-20 DIAGNOSIS — R1030 Lower abdominal pain, unspecified: Secondary | ICD-10-CM | POA: Insufficient documentation

## 2017-10-20 DIAGNOSIS — K8020 Calculus of gallbladder without cholecystitis without obstruction: Secondary | ICD-10-CM | POA: Insufficient documentation

## 2017-10-20 LAB — POCT H PYLORI SCREEN: H Pylori Screen, POC: NEGATIVE

## 2017-10-21 ENCOUNTER — Telehealth: Payer: Self-pay | Admitting: Internal Medicine

## 2017-10-21 LAB — COMPREHENSIVE METABOLIC PANEL
ALBUMIN: 4 g/dL (ref 3.5–5.5)
ALK PHOS: 71 IU/L (ref 39–117)
ALT: 10 IU/L (ref 0–32)
AST: 7 IU/L (ref 0–40)
Albumin/Globulin Ratio: 1.2 (ref 1.2–2.2)
BILIRUBIN TOTAL: 0.5 mg/dL (ref 0.0–1.2)
BUN / CREAT RATIO: 10 (ref 9–23)
BUN: 10 mg/dL (ref 6–20)
CO2: 23 mmol/L (ref 20–29)
CREATININE: 1 mg/dL (ref 0.57–1.00)
Calcium: 9.3 mg/dL (ref 8.7–10.2)
Chloride: 103 mmol/L (ref 96–106)
GFR calc Af Amer: 90 mL/min/{1.73_m2} (ref >59)
GFR calc non Af Amer: 78 mL/min/{1.73_m2} (ref >59)
GLUCOSE: 83 mg/dL (ref 65–99)
Globulin, Total: 3.4 g/dL (ref 1.5–4.5)
Potassium: 4.4 mmol/L (ref 3.5–5.2)
Sodium: 141 mmol/L (ref 134–144)
Total Protein: 7.4 g/dL (ref 6.0–8.5)

## 2017-10-21 LAB — CBC
HEMATOCRIT: 36.8 % (ref 34.0–46.6)
HEMOGLOBIN: 12.2 g/dL (ref 11.1–15.9)
MCH: 27.8 pg (ref 26.6–33.0)
MCHC: 33.2 g/dL (ref 31.5–35.7)
MCV: 84 fL (ref 79–97)
Platelets: 335 10*3/uL (ref 150–379)
RBC: 4.39 x10E6/uL (ref 3.77–5.28)
RDW: 14.3 % (ref 12.3–15.4)
WBC: 7.8 10*3/uL (ref 3.4–10.8)

## 2017-10-21 LAB — LIPASE: LIPASE: 16 U/L (ref 14–72)

## 2017-10-21 NOTE — Telephone Encounter (Signed)
Pt called and wants to her results from her lab work. Please advise

## 2017-10-22 NOTE — Telephone Encounter (Signed)
Called patient to discuss her results.  All her lab work looks good.  Patient right upper quadrant ultrasound is significant for gallstones.  However patient is now having left upper quadrant pain rather than right upper quadrant pain given her story will continue with the MiraLAX as well as have patient continue omeprazole and Carafate to help with possible pain. Patient to follow up in 1 week, discuss next steps if no improvement.

## 2017-11-06 ENCOUNTER — Ambulatory Visit: Payer: Medicaid Other | Admitting: Internal Medicine

## 2017-11-17 ENCOUNTER — Ambulatory Visit: Payer: Medicaid Other | Admitting: Internal Medicine

## 2017-12-04 ENCOUNTER — Encounter: Payer: Self-pay | Admitting: Internal Medicine

## 2017-12-04 ENCOUNTER — Ambulatory Visit (INDEPENDENT_AMBULATORY_CARE_PROVIDER_SITE_OTHER): Payer: Self-pay | Admitting: Internal Medicine

## 2017-12-04 ENCOUNTER — Other Ambulatory Visit: Payer: Self-pay

## 2017-12-04 VITALS — BP 100/80 | HR 75 | Temp 97.9°F | Wt 308.0 lb

## 2017-12-04 DIAGNOSIS — R1030 Lower abdominal pain, unspecified: Secondary | ICD-10-CM

## 2017-12-04 MED ORDER — SUCRALFATE 1 G PO TABS: 1.0000 g | ORAL_TABLET | Freq: Three times a day (TID) | ORAL | 0 refills | Status: DC

## 2017-12-04 MED ORDER — FAMOTIDINE 20 MG PO TABS: 20.0000 mg | ORAL_TABLET | Freq: Two times a day (BID) | ORAL | 0 refills | Status: DC

## 2017-12-04 NOTE — Progress Notes (Signed)
   [facility] Noralee CharsAsiyah Diar Berkel, MD Phone: [phone]  Reason For Visit: Follow-up abdominal pain  #Patient states that abdominal pain has improved significantly since starting Carafate.  She denies taking MiraLAX or Protonix.  Patient right upper quadrant ultrasound was significant for gallstones.  However patient denies any right upper quadrant pain or other symptoms.  She is overall doing well and would like a refill on her Carafate.  No fevers, no nausea, no vomiting.  Eating and drinking well.  Past Medical History Reviewed problem list.  Medications- reviewed and updated No additions to family history Social history- patient is a non-smoker  Objective: BP 100/80   Pulse 75   Temp 97.9 F (36.6 C) (Oral)   Wt (!) 308 lb (139.7 kg)   LMP 09/01/2017   SpO2 98%   BMI 56.33 kg/m  Gen: NAD, alert, cooperative with exam Cardio: regular rate and rhythm, S1S2 heard, no murmurs appreciated GI: soft, non-tender, non-distended, bowel sounds present, no hepatomegaly, no splenomegaly  Assessment/Plan: See problem based a/p  Abdominal pain Abdominal pain resolved with Carafate.  Will also prescribe patient with Pepcid.  Did have a right upper quadrant ultrasound that was positive for gallstones.  Dont believe that these gallstones are causing cholelithiasis.  -Provided patient with education on what a gallbladder attack looks like  - Will continue on current medical regiment follow-up in 3-4 months

## 2017-12-04 NOTE — Assessment & Plan Note (Addendum)
Abdominal pain resolved with Carafate.  Will also prescribe patient with Pepcid.  Did have a right upper quadrant ultrasound that was positive for gallstones.  Dont believe that these gallstones are causing cholelithiasis.  -Provided patient with education on what a gallbladder attack looks like  - Will continue on current medical regiment follow-up in 3-4 months

## 2017-12-04 NOTE — Patient Instructions (Addendum)
I am happy to hear that your abdominal pain has improved significantly.  I am going to refill your Carafate as well as include Pepcid.  Please follow-up with me in 3-4 months.

## 2017-12-30 ENCOUNTER — Other Ambulatory Visit: Payer: Self-pay

## 2017-12-30 ENCOUNTER — Encounter: Payer: Self-pay | Admitting: Internal Medicine

## 2017-12-30 ENCOUNTER — Ambulatory Visit (INDEPENDENT_AMBULATORY_CARE_PROVIDER_SITE_OTHER): Payer: Self-pay | Admitting: Internal Medicine

## 2017-12-30 VITALS — BP 100/80 | HR 87 | Temp 98.7°F | Wt 310.0 lb

## 2017-12-30 DIAGNOSIS — G44209 Tension-type headache, unspecified, not intractable: Secondary | ICD-10-CM

## 2017-12-30 DIAGNOSIS — H9203 Otalgia, bilateral: Secondary | ICD-10-CM

## 2017-12-30 DIAGNOSIS — R059 Cough, unspecified: Secondary | ICD-10-CM

## 2017-12-30 DIAGNOSIS — R0683 Snoring: Secondary | ICD-10-CM

## 2017-12-30 DIAGNOSIS — R05 Cough: Secondary | ICD-10-CM

## 2017-12-30 MED ORDER — FLUTICASONE PROPIONATE 50 MCG/ACT NA SUSP: 2.0000 | Freq: Every day | NASAL | 0 refills | Status: DC

## 2017-12-30 NOTE — Patient Instructions (Addendum)
Let's restart Flonase.  You can take Tylenol Extra Strength as needed for pain.   I ordered a home sleep study to evaluate for sleep apnea  Please follow up if symptoms do not improve or worsen.

## 2017-12-30 NOTE — Progress Notes (Signed)
   [facility] Phone: [phone]   Date of Visit: 12/30/2017   HPI:  Bilateral Ear Pain and Cough:  - reports of 3 day history of bilateral ear ache, cough, and frontal HA - the pain sometimes goes down her jaws  - no rhinorrhea, no sore throat, no nasal congestion but reports of post-nasal drip  - cough is a little productive with white mucous  - frontal headache is characterized as a tightness without radiation of pain, no nausea or vomiting, or visual changes. The pain has been constant. She has been using Aspirin or Tylenol which did not help much.  - reports that her ears pop and they hurt when this occurs. No ringing in the ears or decreased hearing  - no shortness of breath  - no fevers  - no sick contacts - no body aches - she reports of snoring at night. She reports of not getting good rest despite sleeping for a reasonable amount of time.  - she used to use Flonase in the past but has not been on this in a while   ROS: See HPI.  PMFSH:  PMH: Obesity  GERD  PHYSICAL EXAM: BP 100/80   Pulse 87   Temp 98.7 F (37.1 C) (Oral)   Wt (!) 310 lb (140.6 kg)   LMP 09/03/2017   SpO2 98%   BMI 56.70 kg/m  GEN: NAD, nontoxic appearing  HEENT: Atraumatic, normocephalic, neck supple without lymphadenopathy, EOMI, sclera clear, PERRL, posterior oropharynx is unremarkable. Nasal turbinates are swollen and erythematous. Left TM with clear effusion otherwise unremarkable. Right TM is normal. Tenderness to palpation of bilateral TMJ CV: RRR, no murmurs, rubs, or gallops PULM: CTAB, normal effort ABD: Soft, nontender, nondistended, NABS, no organomegaly SKIN: No rash or cyanosis; warm and well-perfused EXTR: No lower extremity edema or calf tenderness PSYCH: Mood and affect euthymic, normal rate and volume of speech NEURO: Awake, alert, no focal neurological deficits, normal gait, normal speech   ASSESSMENT/PLAN:  Bilateral Ear Pain and Frontal HA No  signs of infection. There is some effusion on the left TM but again no infection. Her symptoms could also be due to TMJ pain. We started her Flonase to see if we could resolve congestion and effusion. Regarding possible TMJ pain, unfortunately she has a history of PUD with recent symptoms (which are now controlled with carafate), we cannot do NSAIDs. She can try extra strength Tylenol as needed.   Cough: She has normal work of breathing and normal oxygen saturation. No signs or symptoms concerning for PNA. Likely due to post-nasal drip. Trial of Flonase.   Return precautions discussed.   Palma HolterKanishka G Rakisha Pincock, MD PGY 3 [facility]

## 2017-12-31 ENCOUNTER — Encounter: Payer: Self-pay | Admitting: Internal Medicine

## 2018-01-22 ENCOUNTER — Ambulatory Visit: Payer: Self-pay | Admitting: Internal Medicine

## 2018-01-23 ENCOUNTER — Emergency Department (HOSPITAL_COMMUNITY): Payer: Medicaid Other

## 2018-01-23 ENCOUNTER — Other Ambulatory Visit: Payer: Self-pay

## 2018-01-23 ENCOUNTER — Encounter (HOSPITAL_COMMUNITY): Payer: Self-pay

## 2018-01-23 ENCOUNTER — Emergency Department (HOSPITAL_COMMUNITY)
Admission: EM | Admit: 2018-01-23 | Discharge: 2018-01-23 | Disposition: A | Discharge status: Home / Self Care | Payer: Medicaid Other | Attending: Emergency Medicine | Admitting: Emergency Medicine

## 2018-01-23 DIAGNOSIS — M940 Chondrocostal junction syndrome [Tietze]: Secondary | ICD-10-CM | POA: Insufficient documentation

## 2018-01-23 DIAGNOSIS — Z79899 Other long term (current) drug therapy: Secondary | ICD-10-CM | POA: Insufficient documentation

## 2018-01-23 LAB — BASIC METABOLIC PANEL
Anion gap: 10 (ref 5–15)
BUN: 9 mg/dL (ref 6–20)
CALCIUM: 8.6 mg/dL — AB (ref 8.9–10.3)
CO2: 24 mmol/L (ref 22–32)
CREATININE: 0.91 mg/dL (ref 0.44–1.00)
Chloride: 105 mmol/L (ref 101–111)
GFR calc non Af Amer: >60 mL/min (ref >60)
Glucose, Bld: 76 mg/dL (ref 65–99)
Potassium: 3.7 mmol/L (ref 3.5–5.1)
Sodium: 139 mmol/L (ref 135–145)

## 2018-01-23 LAB — I-STAT TROPONIN, ED: TROPONIN I, POC: 0 ng/mL (ref 0.00–0.08)

## 2018-01-23 LAB — I-STAT BETA HCG BLOOD, ED (MC, WL, AP ONLY): I-stat hCG, quantitative: <5 m[IU]/mL (ref <5)

## 2018-01-23 LAB — CBC
HCT: 36.6 % (ref 36.0–46.0)
Hemoglobin: 12 g/dL (ref 12.0–15.0)
MCH: 27.5 pg (ref 26.0–34.0)
MCHC: 32.8 g/dL (ref 30.0–36.0)
MCV: 83.9 fL (ref 78.0–100.0)
PLATELETS: 337 10*3/uL (ref 150–400)
RBC: 4.36 MIL/uL (ref 3.87–5.11)
RDW: 14.7 % (ref 11.5–15.5)
WBC: 9.2 10*3/uL (ref 4.0–10.5)

## 2018-01-23 MED ORDER — KETOROLAC TROMETHAMINE 30 MG/ML IJ SOLN
30.0000 mg | Freq: Once | INTRAMUSCULAR | Status: AC
  Administered 2018-01-23: 30 mg via INTRAMUSCULAR
  Filled 2018-01-23: qty 1

## 2018-01-23 NOTE — ED Triage Notes (Signed)
Pt comes in today complaint of chest pain, left side "near her heart." She reports the pain began last Friday. She states the pain radiated into her neck and down her left arm. Skin warm and dry. No distress noted.

## 2018-01-23 NOTE — ED Provider Notes (Signed)
[facility] EMERGENCY DEPARTMENT Provider Note   CSN: [id number] Arrival date & time: 01/23/18  4098     History   Chief Complaint Chief Complaint  Patient presents with  . Chest Pain    HPI Veronica [name] is a 27 y.o. female.  27 year old female with prior history of obesity and GERD presents with complaint of anterior chest wall pain.  Patient reports that this has been a problem for the last week.  Patient reports that her symptoms are continuous.  Patient reports that with lifting or specific movement in the chest wall hurts more.  She denies any specific injury.  She denies associated shortness of breath, nausea, vomiting fever, or other acute complaint.  She appears to be comfortable at time of my exam.  She has taken Tylenol at home with minimal improvement in her symptoms.   The history is provided by the patient.  Chest Pain   This is a new problem. The current episode started more than 2 days ago. The problem occurs constantly. The problem has not changed since onset.The pain is associated with lifting. The pain is present in the substernal region. The pain is mild. The quality of the pain is described as sharp. The pain does not radiate. Pertinent negatives include no back pain, no cough, no fever and no shortness of breath. She has tried nothing for the symptoms. The treatment provided no relief.    Past Medical History:  Diagnosis Date  . Anemia   . Ankle pain   . GERD (gastroesophageal reflux disease)   . Hiatal hernia   . Obesity (BMI 30-39.9)     Patient Active Problem List   Diagnosis Date Noted  . Cough 10/16/2017  . Redness of both eyes 09/24/2016  . Neuropathic pain 05/10/2016  . Fatigue 05/09/2016  . Polyuria 05/09/2016  . Vaginal yeast infection 03/07/2016  . Diarrhea 04/20/2015  . Bacterial vaginosis 01/19/2014  . Morbid obesity (HCC) 08/25/2013  . Potential exposure to STD 01/13/2013  . Dysuria 01/13/2013  . Cerumen  impaction 10/06/2012  . Eyelid laceration, right 01/29/2012  . Abdominal pain 01/06/2012  . Healthcare maintenance 10/20/2011  . Pelvic pain in female 10/20/2011  . Headache(784.0) 08/26/2011  . Birth control counseling 08/26/2011  . Numbness 08/26/2011  . Acne 03/28/2011  . GERD (gastroesophageal reflux disease) 03/28/2011  . Obesity (BMI 30-39.9) 02/27/2011  . Anemia 02/27/2011    Past Surgical History:  Procedure Laterality Date  . CESAREAN SECTION  09/02/2010   For macrosomia, but son was 7 lb 12 oz  . TONSILLECTOMY AND ADENOIDECTOMY      OB History    No data available       Home Medications    Prior to Admission medications   Medication Sig Start Date End Date Taking? Authorizing Provider  acetaminophen (TYLENOL) 500 MG tablet Take 1 tablet (500 mg total) by mouth every 6 (six) hours as needed. Patient taking differently: Take 500 mg every 6 (six) hours as needed by mouth (for pain or headaches).  07/12/16   Dan Humphreys, MD  albuterol (PROVENTIL HFA;VENTOLIN HFA) 108 (90 Base) MCG/ACT inhaler Inhale 2 puffs into the lungs every 4 (four) hours as needed for wheezing or shortness of breath (cough). Patient taking differently: Inhale 2 puffs every 4 (four) hours as needed into the lungs (coughing, shortness of breath, or wheezing).  10/09/17   Street, [place], [name]  artificial tears (LACRILUBE) OINT ophthalmic ointment Place into the right eye every 4 (  four) hours as needed for dry eyes. Patient not taking: Reported on 10/19/2017 09/27/16   Charlestine Night, [name]  famotidine (PEPCID) 20 MG tablet Take 1 tablet (20 mg total) by mouth 2 (two) times daily. 12/04/17   Mikell, Antionette Poles, MD  Fe Fum-FePoly-FA-Vit C-Vit B3 (INTEGRA F) 125-1 MG CAPS Take 1 capsule by mouth daily. 04/29/17   Mikell, Antionette Poles, MD  fluticasone (FLONASE) 50 MCG/ACT nasal spray Place 2 sprays into both nostrils daily. 12/30/17   Palma Holter, MD  potassium chloride SA (K-DUR,KLOR-CON) 20  MEQ tablet Take 1 tablet (20 mEq total) 2 (two) times daily by mouth. 10/19/17   Roxy Horseman, [name]  sucralfate (CARAFATE) 1 g tablet Take 1 tablet (1 g total) by mouth 4 (four) times daily -  with meals and at bedtime. 12/04/17   Berton Bon, MD    Family History Family History  Problem Relation Age of Onset  . Diabetes Mother   . Hypertension Mother   . Obesity Mother   . Obesity Sister   . Osteochondroma Sister   . Diabetes Sister   . Hypertension Sister     Social History Social History   Tobacco Use  . Smoking status: Never Smoker  . Smokeless tobacco: Never Used  Substance Use Topics  . Alcohol use: No  . Drug use: No     Allergies   Morphine and related and Motrin [ibuprofen]   Review of Systems Review of Systems  Constitutional: Negative for fever.  Respiratory: Negative for cough and shortness of breath.   Cardiovascular: Positive for chest pain.  Musculoskeletal: Negative for back pain.  All other systems reviewed and are negative.    Physical Exam Updated Vital Signs BP 128/72   Pulse 74   Temp 98.9 F (37.2 C) (Oral)   Resp 17   SpO2 100%   Physical Exam  Constitutional: She is oriented to person, place, and time. She appears well-developed and well-nourished. No distress.  HENT:  Head: Normocephalic and atraumatic.  Mouth/Throat: Oropharynx is clear and moist.  Eyes: Conjunctivae and EOM are normal. Pupils are equal, round, and reactive to light.  Neck: Normal range of motion. Neck supple.  Cardiovascular: Normal rate, regular rhythm and normal heart sounds.  Patient with reproducible anterior chest wall pain.   Pulmonary/Chest: Effort normal and breath sounds normal. No respiratory distress.  Abdominal: Soft. She exhibits no distension. There is no tenderness.  Musculoskeletal: Normal range of motion. She exhibits no edema or deformity.  Neurological: She is alert and oriented to person, place, and time.  Skin: Skin is warm  and dry.  Psychiatric: She has a normal mood and affect.  Nursing note and vitals reviewed.    ED Treatments / Results  Labs (all labs ordered are listed, but only abnormal results are displayed) Labs Reviewed  BASIC METABOLIC PANEL - Abnormal; Notable for the following components:      Result Value   Calcium 8.6 (*)    All other components within normal limits  CBC  I-STAT TROPONIN, ED  I-STAT BETA HCG BLOOD, ED ([facility], [facility], [facility] ONLY)    EKG  EKG Interpretation  Date/Time:  Saturday January 23 2018 09:28:43 EST Ventricular Rate:  72 PR Interval:  192 QRS Duration: 82 QT Interval:  392 QTC Calculation: 429 R Axis:   37 Text Interpretation:  Normal sinus rhythm Normal ECG Confirmed by Kristine Royal [phone]) on 01/23/2018 11:54:28 AM       Radiology Dg Chest 2  View  Result Date: 01/23/2018 CLINICAL DATA:  Shortness of breath with chest pain EXAM: CHEST  2 VIEW COMPARISON:  10/09/2017 FINDINGS: Slightly lower lung volumes compared to the prior study. Stable heart size and vascularity. No focal pneumonia, collapse or consolidation. Negative for edema, effusion or pneumothorax. Trachea is midline. No acute osseous finding. IMPRESSION: Lower lung volumes.  Otherwise no acute chest process. Electronically Signed   By: Judie PetitM.  Shick M.D.   On: 01/23/2018 10:25    Procedures Procedures (including critical care time)  Medications Ordered in ED Medications  ketorolac (TORADOL) 30 MG/ML injection 30 mg (not administered)     Initial Impression / Assessment and Plan / ED Course  I have reviewed the triage vital signs and the nursing notes.  Pertinent labs & imaging results that were available during my care of the patient were reviewed by me and considered in my medical decision making (see chart for details).     MDM  Screen complete  Patient is presenting with likely costochondritis.  EKG is without findings of acute ischemia.  Chest x-ray is within normal limits.  Screening  labs in the ED, including a troponin, are negative.  Patient's pain is reproducible with palpation of the anterior chest wall.  Patient is appropriate for discharge home.  Strict return precautions given and understood.  Close follow-up is advised.  Final Clinical Impressions(s) / ED Diagnoses   Final diagnoses:  Costochondritis    ED Discharge Orders    None       Wynetta FinesMessick, Janaiah Vetrano C, MD 01/23/18 1230

## 2018-01-23 NOTE — Discharge Instructions (Signed)
Please return for any problem.  Use Tylenol as instructed for pain.

## 2018-01-25 ENCOUNTER — Ambulatory Visit (INDEPENDENT_AMBULATORY_CARE_PROVIDER_SITE_OTHER): Payer: Self-pay | Admitting: Internal Medicine

## 2018-01-25 ENCOUNTER — Encounter: Payer: Self-pay | Admitting: Internal Medicine

## 2018-01-25 ENCOUNTER — Other Ambulatory Visit: Payer: Self-pay

## 2018-01-25 VITALS — BP 124/72 | HR 78 | Temp 98.4°F | Ht 62.0 in | Wt 312.6 lb

## 2018-01-25 DIAGNOSIS — M940 Chondrocostal junction syndrome [Tietze]: Secondary | ICD-10-CM | POA: Insufficient documentation

## 2018-01-25 NOTE — Progress Notes (Signed)
   [facility] Veronica CharsAsiyah Zondra Lawlor, MD Phone: [phone]  Reason For Visit: Follow-up for costochondritis  #Costochondritis -Patient was evaluated in the ED on 2/16 for chest pain.  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  She then developed chest pain which was significant especially on palpation on sternum and movement of her chest. She was seen on 2/16 2019.  She was placed on restrictions for 6 days.  She feels like she has since improved significantly and would like to return to work on 2/20 without restrictions.  Past Medical History Reviewed problem list.  Medications- reviewed and updated No additions to family history Social history- patient is a non smoker  Objective: BP 124/72   Pulse 78   Temp 98.4 F (36.9 C) (Oral)   Ht 5\' 2"  (1.575 m)   Wt (!) 312 lb 9.6 oz (141.8 kg)   SpO2 98%   BMI 57.18 kg/m  Gen: NAD, alert, cooperative with exam Cardio: regular rate and rhythm, S1S2 heard, no murmurs appreciated Pulm: clear to auscultation bilaterally, no wheezes, rhonchi or rales MSK: Notable for slight pain with palpation of sternum still present, no other significant symptoms, normal range of motion of spine Skin: dry, intact, no rashes or lesions   Assessment/Plan: See problem based a/p  Costochondritis Costochondritis, improving Continue ibuprofen or Tylenol as needed for pain Provided work note with return to work on 01/27/2018 without restrictions

## 2018-01-26 NOTE — Assessment & Plan Note (Signed)
Costochondritis, improving Continue ibuprofen or Tylenol as needed for pain Provided work note with return to work on 01/27/2018 without restrictions

## 2018-02-10 ENCOUNTER — Encounter: Payer: Self-pay | Admitting: Internal Medicine

## 2018-02-10 ENCOUNTER — Ambulatory Visit (INDEPENDENT_AMBULATORY_CARE_PROVIDER_SITE_OTHER): Payer: Self-pay | Admitting: Internal Medicine

## 2018-02-10 ENCOUNTER — Other Ambulatory Visit: Payer: Self-pay

## 2018-02-10 VITALS — BP 124/72 | HR 71 | Temp 98.0°F | Wt 305.0 lb

## 2018-02-10 DIAGNOSIS — M940 Chondrocostal junction syndrome [Tietze]: Secondary | ICD-10-CM

## 2018-02-10 DIAGNOSIS — R059 Cough, unspecified: Secondary | ICD-10-CM

## 2018-02-10 DIAGNOSIS — R05 Cough: Secondary | ICD-10-CM

## 2018-02-10 MED ORDER — BENZONATATE 200 MG PO CAPS
200.0000 mg | ORAL_CAPSULE | Freq: Three times a day (TID) | ORAL | 0 refills | Status: DC | PRN
Start: 2018-02-10 — End: 2019-05-09

## 2018-02-10 NOTE — Progress Notes (Signed)
   [name] Phone: [phone]   Date of Visit: 02/10/2018   HPI: 11:19  Shortness of breath and chest pain for 1 month: - feels like she cant' take a deep breath in  - no chest pain with deep inspiration  - chest pain with bending down grabbing, twisting upper body. Occurs maybe every other day. It will last for a few hours. Ibuprofen helps but symptoms will return. She describes it as an achy pain at the center of the chest. Not relieved with rest.  - shortness of breath at rest and with activity - cough x 1 week to the point she is gagging  - cough is dry. No rhinorrhea or sore throat but reports of postnasal drip. Subjective fever and chills in the last week. Her son was recovering from the flu last week  - no tobacco use - no swelling in legs or calf pain. No long car or plane rides. No prolonged bed rest. No personal history of blood clots.  - she was seen in the ED and in clinic for chest pain 2/16 and 2/18 respectively. Diagnosed with costochondritis. EKG at the ED was unremarkable. istat troponin negative x 1.  -She does have a history of reflux which she feels is well controlled with Nexium. - mother had MI at age 26yo.   ROS: See HPI.  PMFSH:  PMH: GERD  Obesity   PHYSICAL EXAM: BP 124/72   Pulse 71   Temp 98 F (36.7 C) (Oral)   Wt (!) 305 lb (138.3 kg)   LMP 08/13/2017 (Approximate)   SpO2 97%   BMI 55.79 kg/m  GEN: NAD, obese HEENT: Neck supple, EOMI, sclera clear. TMs normal bilaterally, nasal turbinates swollen and slightly erythematous with minimal discharge.  Oropharynx is overall unremarkable CV: RRR, no murmurs, rubs, or gallops. Chest pain is reproducible on palpation  PULM: CTAB, normal effort SKIN: No rash or cyanosis; warm and well-perfused EXTR: No lower extremity edema or calf tenderness PSYCH: Mood and affect euthymic, normal rate and volume of speech NEURO: Awake, alert, no focal deficits grossly, normal  speech  ASSESSMENT/PLAN:  Cough: Likely viral syndrome or postnasal drip.  Her vitals are stable and her lungs are clear to auscultation and pulse ox is normal. Tessalon 200 mg 3 times daily as needed.  I asked her to restart Flonase which has helped her in the past with this.  Chest pain: Most consistent with costochondritis as it is reproducible to palpation.  Continue NSAIDs as needed.  Unlikely this is cardiac in etiology.  Unlikely this is caused by PE as she is not tachycardic or hypoxic.   Follow up in 4-6 weeks  Palma HolterKanishka G Gunadasa, MD PGY 3 [facility]

## 2018-02-10 NOTE — Patient Instructions (Addendum)
Lets try the nasal spray again Try tea with honey  Try tessalon as needed For your chest pain take ibuprofen as needed.  Follow up in abour 4-6 weeks

## 2018-03-25 ENCOUNTER — Other Ambulatory Visit (HOSPITAL_COMMUNITY)
Admission: RE | Admit: 2018-03-25 | Discharge: 2018-03-25 | Disposition: A | Discharge status: Home / Self Care | Payer: Medicaid Other | Source: Ambulatory Visit | Attending: Family Medicine | Admitting: Family Medicine

## 2018-03-25 ENCOUNTER — Encounter: Payer: Self-pay | Admitting: Internal Medicine

## 2018-03-25 ENCOUNTER — Ambulatory Visit (INDEPENDENT_AMBULATORY_CARE_PROVIDER_SITE_OTHER): Payer: Self-pay | Admitting: Internal Medicine

## 2018-03-25 ENCOUNTER — Other Ambulatory Visit: Payer: Self-pay

## 2018-03-25 VITALS — BP 116/60 | HR 88 | Temp 98.4°F | Ht 62.0 in | Wt 311.8 lb

## 2018-03-25 DIAGNOSIS — N912 Amenorrhea, unspecified: Secondary | ICD-10-CM | POA: Insufficient documentation

## 2018-03-25 DIAGNOSIS — J302 Other seasonal allergic rhinitis: Secondary | ICD-10-CM

## 2018-03-25 DIAGNOSIS — N911 Secondary amenorrhea: Secondary | ICD-10-CM

## 2018-03-25 DIAGNOSIS — Z124 Encounter for screening for malignant neoplasm of cervix: Secondary | ICD-10-CM

## 2018-03-25 HISTORY — DX: Secondary amenorrhea: N91.1

## 2018-03-25 LAB — POCT WET PREP (WET MOUNT)
Clue Cells Wet Prep Whiff POC: NEGATIVE
Trichomonas Wet Prep HPF POC: ABSENT

## 2018-03-25 MED ORDER — FLUTICASONE PROPIONATE 50 MCG/ACT NA SUSP: 2.0000 | Freq: Every day | NASAL | 0 refills | Status: DC

## 2018-03-25 NOTE — Patient Instructions (Signed)
I want you to follow-up in about 2 weeks to discuss the results of your lab work that we are going to do to check on the reason for you not having consistent menstrual periods.  I am going to prescribe you Flonase to help with your seasonal allergies please take this once daily in the morning. up

## 2018-03-25 NOTE — Progress Notes (Signed)
   [facility] Veronica CharsAsiyah Marino Rogerson, MD Phone: [phone]  Reason For Visit:  Follow up   #Women's Health -She has had amenorrhea for about 2 years.  She states that she was previously using Nexplanon and then used Depo, since then she has not been using anything.  -Menstrual periods:  has not had one in 2 years,-she states that her periods have always been irregular.  She states that couple weeks ago she did have a very light and that was the first in about 2 years -She had her last Pap smear in 2015 which was negative -She denies any abnormal vaginal discharge or vaginal symptoms. -She is sexually active this is been with a partner for the last 5 years -She does not use any contraception she does not use any condoms -Does not regularly do breast exams -No family hx breast cancer or endometrial cancer  #Amenorrhea  - No hx of hirsutism  - No cold intolerance, thought endorse weight gain and fatigue  - Denies any galactorrhea - Does note decreased libido   #Seasonal Allergies  -Worsening cough and congestion over the last couple of weeks.  Does have a history of seasonal allergies.  Not currently taking anything.  Past Medical History Reviewed problem list.  Medications- reviewed and updated No additions to family history  Objective: BP 116/60   Pulse 88   Temp 98.4 F (36.9 C) (Oral)   Ht 5\' 2"  (1.575 m)   Wt (!) 311 lb 12.8 oz (141.4 kg)   SpO2 95%   BMI 57.03 kg/m  Gen: NAD, alert, cooperative with exam GI: soft, non-tender, non-distended, bowel sounds present, no hepatomegaly, no splenomegaly GU: external vaginal tissue wl, cervix wnl, no punctate lesions on cervix appreciated, no discharge from cervical os, no bleeding, no cervical motion tenderness, no abdominal/ adnexal masses Skin: dry, intact, no rashes or lesions  Assessment/Plan: See problem based a/p  Amenorrhea Secondary Amenorrhea -Pregnancy? No contraception for 2 years. Possibly  hypothyroidism given weight gain and fatigue, no symptoms of PCOS, no galactorrhea making prolactinoma unlikely, possibly hypothalamic-pituitary disorder - TSH - Prolactin - hCG, serum, qualitative - Follicle stimulating hormone - Follow up in two weeks    Screening for malignant neoplasm of cervix Pap smear, STD screening, patient not interested in birth control at this point -okay with becoming pregnant.  Discussed with patient she should start taking prenatal vitamins.  Though given history of amenorrhea for 2 years unlikely that she is ovulating consistently at this point  Seasonal allergies Flonase and Zyrtec

## 2018-03-26 LAB — CERVICOVAGINAL ANCILLARY ONLY
CHLAMYDIA, DNA PROBE: NEGATIVE
NEISSERIA GONORRHEA: NEGATIVE

## 2018-03-26 LAB — HIV ANTIBODY (ROUTINE TESTING W REFLEX): HIV Screen 4th Generation wRfx: NONREACTIVE

## 2018-03-26 LAB — TSH: TSH: 3.06 u[IU]/mL (ref 0.450–4.500)

## 2018-03-26 LAB — HCG, SERUM, QUALITATIVE: HCG, BETA SUBUNIT, QUAL, SERUM: NEGATIVE m[IU]/mL (ref <6)

## 2018-03-26 LAB — FOLLICLE STIMULATING HORMONE: FSH: 4.7 m[IU]/mL

## 2018-03-26 LAB — RPR: RPR: NONREACTIVE

## 2018-03-27 DIAGNOSIS — Z124 Encounter for screening for malignant neoplasm of cervix: Secondary | ICD-10-CM | POA: Insufficient documentation

## 2018-03-27 MED ORDER — CETIRIZINE HCL 10 MG PO TABS: 10.0000 mg | ORAL_TABLET | Freq: Every day | ORAL | 5 refills | Status: DC

## 2018-03-27 NOTE — Assessment & Plan Note (Signed)
Flonase and Zyrtec

## 2018-03-27 NOTE — Assessment & Plan Note (Signed)
Secondary Amenorrhea -Pregnancy? No contraception for 2 years. Possibly hypothyroidism given weight gain and fatigue, no symptoms of PCOS, no galactorrhea making prolactinoma unlikely, possibly hypothalamic-pituitary disorder - TSH - Prolactin - hCG, serum, qualitative - Follicle stimulating hormone - Follow up in two weeks

## 2018-03-27 NOTE — Assessment & Plan Note (Addendum)
Pap smear, STD screening, patient not interested in birth control at this point -okay with becoming pregnant.  Discussed with patient she should start taking prenatal vitamins.  Though given history of amenorrhea for 2 years unlikely that she is ovulating consistently at this point

## 2018-03-29 LAB — CYTOLOGY - PAP: DIAGNOSIS: NEGATIVE

## 2018-04-01 ENCOUNTER — Encounter: Payer: Self-pay | Admitting: Internal Medicine

## 2018-04-09 ENCOUNTER — Encounter: Payer: Self-pay | Admitting: Internal Medicine

## 2018-04-09 ENCOUNTER — Ambulatory Visit (INDEPENDENT_AMBULATORY_CARE_PROVIDER_SITE_OTHER): Payer: Self-pay | Admitting: Internal Medicine

## 2018-04-09 VITALS — BP 114/65 | HR 79 | Temp 98.7°F | Ht 62.0 in | Wt 310.8 lb

## 2018-04-09 DIAGNOSIS — K219 Gastro-esophageal reflux disease without esophagitis: Secondary | ICD-10-CM

## 2018-04-09 DIAGNOSIS — N911 Secondary amenorrhea: Secondary | ICD-10-CM

## 2018-04-09 DIAGNOSIS — J302 Other seasonal allergic rhinitis: Secondary | ICD-10-CM

## 2018-04-09 MED ORDER — SUCRALFATE 1 G PO TABS: 1.0000 g | ORAL_TABLET | Freq: Three times a day (TID) | ORAL | 0 refills | Status: DC

## 2018-04-09 MED ORDER — FAMOTIDINE 20 MG PO TABS: 20.0000 mg | ORAL_TABLET | Freq: Two times a day (BID) | ORAL | 0 refills | Status: DC

## 2018-04-09 MED ORDER — CETIRIZINE HCL 10 MG PO TABS: 10.0000 mg | ORAL_TABLET | Freq: Every day | ORAL | 5 refills | Status: DC

## 2018-04-09 NOTE — Assessment & Plan Note (Signed)
Reflux symptoms  - Pepcid  - Sulfacrate

## 2018-04-09 NOTE — Patient Instructions (Addendum)
I am going to do some further blood work-up.  I will call you with the results.  Your  cough is likely due to reflux I am going to start you on Pepcid please start taking this twice daily.  I also refilled your sulfacrate.  For your seasonal allergies you can try the Zyrtec I prescribed it should be at the pharmacy.

## 2018-04-09 NOTE — Assessment & Plan Note (Signed)
Initial work-up has been negative.  Unsure of reason for secondary amenorrhea.  Have ruled out hypothyroidism, hyperprolactinemia, and primary ovarian insufficiency. Unlikely PCOS given patient has no signs of hirsutism or hyperandrognism  - Testosterone - Estradiol - Will call patient with results to determine next steps

## 2018-04-09 NOTE — Assessment & Plan Note (Signed)
Seasonal allergy symptoms  Flonase and zyrtec

## 2018-04-09 NOTE — Progress Notes (Signed)
   [name] [name], MD Phone: [phone]  Reason For Visit: Follow up    # Reflux  Patient states that she has had a history of reflux.  Has previously been on Pepcid in the past.  She notes having some burning sensation when she lays down to go to sleep.  And she states she has been coughing all night.  Not currently taking any medications.  Other than sometimes taking the sulfacrate  # Secondary Amonerrhea   Follow-up for secondary amenorrhea.  Initial lab work has all been negative.  Patient has had some spotting in the past but no significant menstrual  as discussed previously in past note  #Seasonal Allergies  Patient notes some ear pain and continued itchy eyes.  Zyrtec was prescribed at her last visit but she has not taken this.  She has been taking the Flonase.  Past Medical History Reviewed problem list.  Medications- reviewed and updated No additions to family history  Objective: BP 114/65 (BP Location: Left Arm, Patient Position: Sitting, Cuff Size: Large)   Pulse 79   Temp 98.7 F (37.1 C) (Oral)   Ht  (1.575 m)   Wt (!) 310 lb 12.8 oz (141 kg)   SpO2 97%   BMI 56.85 kg/m  Gen: NAD, alert, cooperative with exam HEENT: Normal    Neck: No masses palpated. No lymphadenopathy    Ears: Tympanic membranes intact, normal light reflex, no erythema, no bulging    Nose: nasal turbinates moist    Throat: moist mucus membranes, no erythema Cardio: regular rate and rhythm, S1S2 heard, no murmurs appreciated Pulm: clear to auscultation bilaterally, no wheezes, rhonchi or rales Skin: dry, intact, no rashes or lesions  Assessment/Plan: See problem based a/p  Secondary amenorrhea Initial work-up has been negative.  Unsure of reason for secondary amenorrhea.  Have ruled out hypothyroidism, hyperprolactinemia, and primary ovarian insufficiency. Unlikely PCOS given patient has no signs of hirsutism or hyperandrognism  - Testosterone -  Estradiol - Will call patient with results to determine next steps       GERD (gastroesophageal reflux disease) Reflux symptoms  - Pepcid  - Sulfacrate   Seasonal allergies Seasonal allergy symptoms  Flonase and zyrtec

## 2018-04-10 LAB — TESTOSTERONE: Testosterone: 36 ng/dL (ref 8–48)

## 2018-04-10 LAB — ESTRADIOL: ESTRADIOL: 54.4 pg/mL

## 2018-04-14 ENCOUNTER — Telehealth: Payer: Self-pay

## 2018-04-14 NOTE — Telephone Encounter (Signed)
Patient was called and an appointment was made for 04/16/2018 at 0930 for a lab draw of prolactin level that she missed at a prior appointment.Glennie Hawk, CMA

## 2018-04-15 ENCOUNTER — Other Ambulatory Visit: Payer: Self-pay | Admitting: Internal Medicine

## 2018-04-15 DIAGNOSIS — N912 Amenorrhea, unspecified: Secondary | ICD-10-CM

## 2018-04-16 ENCOUNTER — Other Ambulatory Visit (HOSPITAL_COMMUNITY)
Admission: RE | Admit: 2018-04-16 | Discharge: 2018-04-16 | Disposition: A | Discharge status: Home / Self Care | Payer: Medicaid Other | Source: Ambulatory Visit | Attending: Family Medicine | Admitting: Family Medicine

## 2018-04-16 ENCOUNTER — Other Ambulatory Visit: Payer: Medicaid Other

## 2018-04-16 ENCOUNTER — Ambulatory Visit (INDEPENDENT_AMBULATORY_CARE_PROVIDER_SITE_OTHER): Payer: Self-pay | Admitting: Family Medicine

## 2018-04-16 VITALS — BP 124/82 | HR 83 | Temp 98.5°F | Wt 310.0 lb

## 2018-04-16 DIAGNOSIS — N926 Irregular menstruation, unspecified: Secondary | ICD-10-CM

## 2018-04-16 DIAGNOSIS — R319 Hematuria, unspecified: Secondary | ICD-10-CM

## 2018-04-16 DIAGNOSIS — N912 Amenorrhea, unspecified: Secondary | ICD-10-CM

## 2018-04-16 DIAGNOSIS — N898 Other specified noninflammatory disorders of vagina: Secondary | ICD-10-CM

## 2018-04-16 DIAGNOSIS — R109 Unspecified abdominal pain: Secondary | ICD-10-CM

## 2018-04-16 LAB — POCT URINALYSIS DIP (MANUAL ENTRY)
BILIRUBIN UA: NEGATIVE
BILIRUBIN UA: NEGATIVE mg/dL
GLUCOSE UA: NEGATIVE mg/dL
Leukocytes, UA: NEGATIVE
Nitrite, UA: NEGATIVE
PH UA: 6.5 (ref 5.0–8.0)
Protein Ur, POC: NEGATIVE mg/dL
Spec Grav, UA: 1.02 (ref 1.010–1.025)
Urobilinogen, UA: 0.2 E.U./dL

## 2018-04-16 LAB — POCT WET PREP (WET MOUNT)
CLUE CELLS WET PREP WHIFF POC: NEGATIVE
Trichomonas Wet Prep HPF POC: ABSENT

## 2018-04-16 LAB — POCT UA - MICROSCOPIC ONLY

## 2018-04-16 LAB — POCT URINE PREGNANCY: PREG TEST UR: NEGATIVE

## 2018-04-16 MED ORDER — DICLOFENAC SODIUM 75 MG PO TBEC: 75.0000 mg | DELAYED_RELEASE_TABLET | Freq: Two times a day (BID) | ORAL | 0 refills | Status: DC

## 2018-04-16 NOTE — Progress Notes (Signed)
Subjective:    Patient ID: Veronica [name] , female   DOB: [birth year] , 27 y.o..   MRN: [id number]  HPI  Veronica [name] is a 27 year old female with past medical history of GERD, obesity, amenorrhea here for  Chief Complaint  Patient presents with  . Pain    c/o pain at c section site    1. Abdominal Pain: Patient had a C-section in 2011, ever since then she has had sharp abdominal pain. She states it feels like a shocking pain. The pain used to be intermittent and now it is constant. It became constant about 3 months ago. Pain is on RLQ, it has been causing her to limp. She notes that it is worse when sitting down for a long period of time, no other aggravating factors. Alleviating factors include putting pressure on the area. She has a soft BM every other day. Admits to nausea without vomiting (3 months). No diarrhea. No other abdominal surgeries. Pain sometimes gets worse with eating. Notes that she didn't have her period for 3 years and then got a period a couple months ago. Her LMP was 1 week ago, she is already done. She does admit to hematuria 3 weeks, this lasted for about 1 week. Denies any dysuria. Admits to lower back pain. No fevers, chills. She has tried Ibuprofen, this did not provide any relief.  Denies any dyspareunia.  Review of Systems: Per HPI.   Medications: reviewed   Social Hx:  reports that she has never smoked. She has never used smokeless tobacco.   Objective:   BP 124/82 (BP Location: Left Arm)   Pulse 83   Temp 98.5 F (36.9 C) (Oral)   Wt (!) 310 lb (140.6 kg)   SpO2 98%   BMI 56.70 kg/m  Physical Exam  Gen: NAD, alert, cooperative with exam, well-appearing Gastrointestinal: Obese abdomen, soft, normal bowel sounds, tender to palpation in right lower quadrant and suprapubic area, no hernias appreciated although exam limited with body habitus GYN:  External genitalia within normal limits.  Vaginal mucosa pink, moist, normal rugae.  Scant amount of  dark brown discharge on speculum exam.  Could not visualize cervix.  Bimanual exam limited by body habitus however she did have tenderness when trying to palpate the left ovary and the uterus.   Results for orders placed or performed in visit on 04/16/18  POCT urinalysis dipstick  Result Value Ref Range   Color, UA yellow yellow   Clarity, UA clear clear   Glucose, UA negative negative mg/dL   Bilirubin, UA negative negative   Ketones, POC UA negative negative mg/dL   Spec Grav, UA 7.425 9.563 - 1.025   Blood, UA large (A) negative   pH, UA 6.5 5.0 - 8.0   Protein Ur, POC negative negative mg/dL   Urobilinogen, UA 0.2 0.2 or 1.0 E.U./dL   Nitrite, UA Negative Negative   Leukocytes, UA Negative Negative  POCT Wet Prep (Wet [place])  Result Value Ref Range   Source Wet Prep POC VAG    WBC, Wet Prep HPF POC 0-3    Bacteria Wet Prep HPF POC Few Few   Clue Cells Wet Prep HPF POC None None   Clue Cells Wet Prep Whiff POC Negative Whiff    Yeast Wet Prep HPF POC None    KOH Wet Prep POC None    Trichomonas Wet Prep HPF POC Absent Absent  POCT urine pregnancy  Result Value Ref Range   Preg  Test, Ur Negative Negative  POCT UA - Microscopic Only  Result Value Ref Range   WBC, Ur, HPF, POC NONE    RBC, urine, microscopic 1-5    Bacteria, U Microscopic TRACE    Epithelial cells, urine per micros 5-10      Assessment & Plan:  Abdominal pain Acute on chronic abdominal pain.  Patient has had intermittent abdominal pain since her C-section in 2011, notes that has been constant over the last 3 months.  Abdominal exam limited secondary to obese body habitus however she was tender in the suprapubic and right lower quadrants region.  Less likely ectopic pregnancy given negative pregnancy test.  No signs of UTI on urinalysis however there was large hemoglobin in the setting of her coming off of her period.  Wet prep negative for any infection.  Difficult to discern whether this is a  ovarian/uterine vs GI abnormality. Differentials broad; include PID vs constipation vs hernia vs adhesions after C-Section vs endometriosis. - GC chlamydia testing pending - Will order abdominal CT with contrast to further assess -Diclofenac 75 mg twice daily for now for pain - Follow-up in 2 weeks after abdominal CT is obtained - ED precautions discussed  Orders Placed This Encounter  Procedures  . CT Abdomen Pelvis W Contrast    Pain in RLQ and suprapubic area with nausea x 3 months. No UTI. No fever/constipation/diarrhea/hematochezia.    Standing Status:   Future    Standing Expiration Date:   07/18/2019    Order Specific Question:   If indicated for the ordered procedure, I authorize the administration of contrast media per Radiology protocol    Answer:   Yes    Order Specific Question:   Is patient pregnant?    Answer:   No    Order Specific Question:   Preferred imaging location?    Answer:   [facility]    Order Specific Question:   Is Oral Contrast requested for this exam?    Answer:   Yes, Per Radiology protocol    Order Specific Question:   Radiology Contrast Protocol - do NOT remove file path    Answer:   [email or web address]  . POCT urinalysis dipstick  . POCT Wet Prep [employer])  . POCT urine pregnancy  . POCT UA - Microscopic Only   Meds ordered this encounter  Medications  . diclofenac (VOLTAREN) 75 MG EC tablet    Sig: Take 1 tablet (75 mg total) by mouth 2 (two) times daily.    Dispense:  30 tablet    Refill:  0    Anders Simmonds, MD [facility], PGY-3

## 2018-04-16 NOTE — Patient Instructions (Addendum)
Thank you for coming in today, it was so nice to see you! Today we talked about:    Abdominal pain: We ordered a abdominal CT scan for you to see what is going on.  Page has helped you schedule this.  Your urine and vaginal test did not show any signs of infection.  Your pregnancy test is negative.  I am not sure what is causing this pain but hopefully the CT scan can give [place] more insight.  For now he can take diclofenac as needed for pain.  I have sent this prescription to your pharmacy  Please follow up in 2 weeks with Dr. Cathlean Cower so you can go over your CT scan results and follow-up for your pain. You can schedule this appointment at the front desk before you leave or call the clinic.  If we ordered any tests today, you will be notified via telephone of any abnormalities. If everything is normal you will get a letter in the mail.   If you have any questions or concerns, please do not hesitate to call the office at [phone]. You can also message me directly via MyChart.   Sincerely,  Anders Simmonds, MD

## 2018-04-16 NOTE — Assessment & Plan Note (Addendum)
Acute on chronic abdominal pain.  Patient has had intermittent abdominal pain since her C-section in 2011, notes that has been constant over the last 3 months.  Abdominal exam limited secondary to obese body habitus however she was tender in the suprapubic and right lower quadrants region.  Less likely ectopic pregnancy given negative pregnancy test.  No signs of UTI on urinalysis however there was large hemoglobin in the setting of her coming off of her period.  Wet prep negative for any infection.  Difficult to discern whether this is a ovarian/uterine vs GI abnormality. Differentials broad; include PID vs constipation vs hernia vs adhesions after C-Section vs endometriosis. - GC chlamydia testing pending - Will order abdominal CT with contrast to further assess -Diclofenac 75 mg twice daily for now for pain - Follow-up in 2 weeks after abdominal CT is obtained - ED precautions discussed

## 2018-04-17 LAB — PROLACTIN: PROLACTIN: 36.4 ng/mL — AB (ref 4.8–23.3)

## 2018-04-19 ENCOUNTER — Encounter: Payer: Self-pay | Admitting: Family Medicine

## 2018-04-19 LAB — CERVICOVAGINAL ANCILLARY ONLY
CHLAMYDIA, DNA PROBE: NEGATIVE
Neisseria Gonorrhea: NEGATIVE

## 2018-04-20 ENCOUNTER — Encounter: Payer: Self-pay | Admitting: Family Medicine

## 2018-04-20 NOTE — Progress Notes (Signed)
Letter sent to patient's house with results

## 2018-04-22 ENCOUNTER — Telehealth: Payer: Self-pay | Admitting: Internal Medicine

## 2018-04-22 NOTE — Telephone Encounter (Signed)
Patient to discuss her mildly elevated prolactin level.  She will need a fasting sample of her prolactin as this to be elevated by meals.  Believe her amenorrhea is likely due to her weight and would like to trial her on progesterone 10-14 days to stimulate menstrual period

## 2018-04-23 ENCOUNTER — Ambulatory Visit (HOSPITAL_COMMUNITY): Payer: Medicaid Other

## 2018-05-17 ENCOUNTER — Ambulatory Visit (INDEPENDENT_AMBULATORY_CARE_PROVIDER_SITE_OTHER): Payer: Medicaid Other | Admitting: Internal Medicine

## 2018-05-17 ENCOUNTER — Other Ambulatory Visit: Payer: Self-pay

## 2018-05-17 ENCOUNTER — Encounter: Payer: Self-pay | Admitting: Internal Medicine

## 2018-05-17 VITALS — BP 114/72 | HR 84 | Temp 98.3°F | Ht 62.0 in | Wt 309.4 lb

## 2018-05-17 DIAGNOSIS — Z309 Encounter for contraceptive management, unspecified: Secondary | ICD-10-CM

## 2018-05-17 DIAGNOSIS — N911 Secondary amenorrhea: Secondary | ICD-10-CM

## 2018-05-17 DIAGNOSIS — K219 Gastro-esophageal reflux disease without esophagitis: Secondary | ICD-10-CM

## 2018-05-17 DIAGNOSIS — Z3009 Encounter for other general counseling and advice on contraception: Secondary | ICD-10-CM | POA: Diagnosis not present

## 2018-05-17 LAB — POCT URINE PREGNANCY: Preg Test, Ur: NEGATIVE

## 2018-05-17 MED ORDER — NORELGESTROMIN-ETH ESTRADIOL 150-35 MCG/24HR TD PTWK: 1.0000 | MEDICATED_PATCH | TRANSDERMAL | 12 refills | Status: DC

## 2018-05-17 MED ORDER — SUCRALFATE 1 G PO TABS: 1.0000 g | ORAL_TABLET | Freq: Three times a day (TID) | ORAL | 0 refills | Status: DC

## 2018-05-17 NOTE — Assessment & Plan Note (Addendum)
-   Discussed risk associated with this medication - including reduced potential for reduced contraceptive efficacy due to obesity  - norelgestromin-ethinyl estradiol (ORTHO EVRA) 150-35 MCG/24HR transdermal patch; Place 1 patch onto the skin once a week.  Dispense: 3 patch; Refill: 12 - POCT urine pregnancy

## 2018-05-17 NOTE — Assessment & Plan Note (Addendum)
Resolved, likely due to estrogen dominance  Discussed patient coming for recheck on Prolactin while fasting

## 2018-05-17 NOTE — Patient Instructions (Signed)
Please start using the patch today.

## 2018-05-17 NOTE — Progress Notes (Signed)
   [facility] [name], MD Phone: [phone]  Reason For Visit: Follow up Birth Control   #Birth Control  -No history of blood clots, no history of migraines with aura, no smoking,   -Previously has been on Nexplanon does not want to restart this.  Not interested in IUD.  Does not feel like she could take pills reliably. -Currently sexually active for the past few months - not on any birth control  -Denies any vaginal discharge  -Last menstrual period ended yesterday   Past Medical History Reviewed problem list.  Medications- reviewed and updated No additions to family history  Objective: BP 114/72   Pulse 84   Temp 98.3 F (36.8 C) (Oral)   Ht 5\' 2"  (1.575 m)   Wt (!) 309 lb 6.4 oz (140.3 kg)   SpO2 97%   BMI 56.59 kg/m  Gen: NAD, alert, cooperative with exam  MSK: Normal gait and station Skin: dry, intact, no rashes or lesions  Assessment/Plan: See problem based a/p  Birth control counseling - Discussed risk associated with this medication - including reduced potential for reduced contraceptive efficacy due to obesity  - norelgestromin-ethinyl estradiol (ORTHO EVRA) 150-35 MCG/24HR transdermal patch; Place 1 patch onto the skin once a week.  Dispense: 3 patch; Refill: 12 - POCT urine pregnancy   Secondary amenorrhea Resolved, likely due to estrogen dominance  Discussed patient coming for recheck on Prolactin while fasting

## 2018-06-15 ENCOUNTER — Other Ambulatory Visit: Payer: Self-pay | Admitting: *Deleted

## 2018-06-16 MED ORDER — DICLOFENAC SODIUM 75 MG PO TBEC: 75.0000 mg | DELAYED_RELEASE_TABLET | Freq: Two times a day (BID) | ORAL | 0 refills | Status: DC

## 2018-07-14 ENCOUNTER — Inpatient Hospital Stay (HOSPITAL_COMMUNITY)
Admission: AD | Admit: 2018-07-14 | Discharge: 2018-07-14 | Disposition: A | Discharge status: Home / Self Care | Payer: Medicaid Other | Source: Ambulatory Visit | Attending: Obstetrics & Gynecology | Admitting: Obstetrics & Gynecology

## 2018-07-14 DIAGNOSIS — N912 Amenorrhea, unspecified: Secondary | ICD-10-CM | POA: Insufficient documentation

## 2018-07-14 NOTE — MAU Note (Signed)
Pt states she wants pregnancy test. Has nausea and is "sleepy."  Is 5 days late for cycle.

## 2018-07-14 NOTE — MAU Provider Note (Signed)
Ms. Merlene LaughterKimberly R Ansell is a 27 y.o. who present to MAU today for pregnancy confirmation. She denies abdominal pain or vaginal bleeding.   BP 117/71 (BP Location: Right Arm)   Pulse 94   Temp 98 F (36.7 C) (Oral)   Resp 16  CONSTITUTIONAL: Well-developed, obese female in no acute distress.  CARDIOVASCULAR: Normal heart rate noted RESPIRATORY: Effort and breath sounds normal GASTROINTESTINAL:Soft, no distention noted.  No tenderness, rebound or guarding.  SKIN: Skin is warm and dry. No rash noted. Not diaphoretic. No erythema. No pallor. PSYCHIATRIC: Normal mood and affect. Normal behavior. Normal judgment and thought content.  MDM Medical screening exam complete Patient does not endorse any symptoms concerning for ectopic pregnancy or pregnancy related complication today.   A:  Amenorrhea  P: Discharge home Patient advised that she can present as a walk-in to [facility] for a pregnancy test M-Th between 8am-4pm or Friday between 8am -11am Reasons to return to MAU reviewed  Patient may return to MAU as needed or if her condition were to change or worsen  Sharyon CableRogers, Jaquarius Seder C, CNM 07/14/2018 9:23 PM

## 2018-08-04 ENCOUNTER — Other Ambulatory Visit: Payer: Self-pay | Admitting: Student in an Organized Health Care Education/Training Program

## 2018-08-13 ENCOUNTER — Other Ambulatory Visit: Payer: Self-pay

## 2018-08-13 ENCOUNTER — Emergency Department (HOSPITAL_COMMUNITY)
Admission: EM | Admit: 2018-08-13 | Discharge: 2018-08-13 | Disposition: A | Discharge status: Home / Self Care | Payer: Medicaid Other | Attending: Emergency Medicine | Admitting: Emergency Medicine

## 2018-08-13 ENCOUNTER — Encounter (HOSPITAL_COMMUNITY): Payer: Self-pay

## 2018-08-13 DIAGNOSIS — B9789 Other viral agents as the cause of diseases classified elsewhere: Secondary | ICD-10-CM | POA: Insufficient documentation

## 2018-08-13 DIAGNOSIS — Z79899 Other long term (current) drug therapy: Secondary | ICD-10-CM | POA: Insufficient documentation

## 2018-08-13 DIAGNOSIS — J069 Acute upper respiratory infection, unspecified: Secondary | ICD-10-CM | POA: Insufficient documentation

## 2018-08-13 NOTE — ED Triage Notes (Signed)
Pt states cough, nasal congestion, facial pain that started yesterday. Pt afebrile. Resp e/u.

## 2018-08-13 NOTE — Discharge Instructions (Addendum)
Please purchase an expectorant like Mucinex to help loosen and thin the mucus production.You may also try some over the counter Robitussin to help with your cough. Continue to hydrate with plenty of fluids and Gatorade.If you experience any shortness of breath, chest pain or fever please return to the ED for reevaluation.  ° °

## 2018-08-13 NOTE — ED Provider Notes (Signed)
[facility] EMERGENCY DEPARTMENT Provider Note   CSN: [id number] Arrival date & time: 08/13/18  1021     History   Chief Complaint Chief Complaint  Patient presents with  . URI    HPI Veronica [name] is a 27 y.o. female.  27 y.o female with no PMH presents to the ED with a chief complaint of cough, rhinorrhea and sinus pressure x 1 day. Patient states the symptoms began yesterday afternoon with a dry cough and sinus pressure. Patient then reports clear rhinorrhea but increasing pain on the front aspect of her forehead. She has tried ibuprofen and states some relieve in symptoms. 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 She denies sore throat, chest pain or shortness of breath.     Past Medical History:  Diagnosis Date  . Anemia   . Ankle pain   . GERD (gastroesophageal reflux disease)   . Hiatal hernia   . Obesity (BMI 30-39.9)     Patient Active Problem List   Diagnosis Date Noted  . Screening for malignant neoplasm of cervix 03/27/2018  . Secondary amenorrhea 03/25/2018  . Seasonal allergies 03/25/2018  . Abdominal pain 01/06/2012  . Birth control counseling 08/26/2011  . GERD (gastroesophageal reflux disease) 03/28/2011  . Obesity (BMI 30-39.9) 02/27/2011  . Anemia 02/27/2011    Past Surgical History:  Procedure Laterality Date  . CESAREAN SECTION  09/02/2010   For macrosomia, but son was 7 lb 12 oz  . TONSILLECTOMY AND ADENOIDECTOMY       OB History   None      Home Medications    Prior to Admission medications   Medication Sig Start Date End Date Taking? Authorizing Provider  acetaminophen (TYLENOL) 500 MG tablet Take 1 tablet (500 mg total) by mouth every 6 (six) hours as needed. Patient taking differently: Take 500 mg every 6 (six) hours as needed by mouth (for pain or headaches).  07/12/16   Dan Humphreys, MD  albuterol (PROVENTIL HFA;VENTOLIN HFA) 108 (90 Base) MCG/ACT inhaler Inhale 2 puffs  into the lungs every 4 (four) hours as needed for wheezing or shortness of breath (cough). Patient taking differently: Inhale 2 puffs every 4 (four) hours as needed into the lungs (coughing, shortness of breath, or wheezing).  10/09/17   Street, [place], [name]  artificial tears (LACRILUBE) OINT ophthalmic ointment Place into the right eye every 4 (four) hours as needed for dry eyes. Patient not taking: Reported on 10/19/2017 09/27/16   Charlestine Night, [name]  benzonatate (TESSALON) 200 MG capsule Take 1 capsule (200 mg total) by mouth 3 (three) times daily as needed for cough. 02/10/18   Palma Holter, MD  cetirizine (ZYRTEC) 10 MG tablet Take 1 tablet (10 mg total) by mouth daily. 04/09/18   Mikell, Antionette Poles, MD  diclofenac (VOLTAREN) 75 MG EC tablet TAKE 1 TABLET BY MOUTH TWICE DAILY WITH FOOD 08/06/18   Jamelle Rushing L, DO  famotidine (PEPCID) 20 MG tablet Take 1 tablet (20 mg total) by mouth 2 (two) times daily. 04/09/18   Mikell, Antionette Poles, MD  Fe Fum-FePoly-FA-Vit C-Vit B3 (INTEGRA F) 125-1 MG CAPS Take 1 capsule by mouth daily. 04/29/17   Mikell, Antionette Poles, MD  fluticasone (FLONASE) 50 MCG/ACT nasal spray Place 2 sprays into both nostrils daily. 03/25/18   Mikell, Antionette Poles, MD  potassium chloride SA (K-DUR,KLOR-CON) 20 MEQ tablet Take 1 tablet (20 mEq total) 2 (two) times daily by mouth. 10/19/17  Roxy Horseman, [name]  sucralfate (CARAFATE) 1 g tablet Take 1 tablet (1 g total) by mouth 4 (four) times daily -  with meals and at bedtime. 05/17/18   Berton Bon, MD    Family History Family History  Problem Relation Age of Onset  . Diabetes Mother   . Hypertension Mother   . Obesity Mother   . Obesity Sister   . Osteochondroma Sister   . Diabetes Sister   . Hypertension Sister     Social History Social History   Tobacco Use  . Smoking status: Never Smoker  . Smokeless tobacco: Never Used  Substance Use Topics  . Alcohol use: No  . Drug use: No      Allergies   Morphine and related and Motrin [ibuprofen]   Review of Systems Review of Systems  Constitutional: Negative for fever.  HENT: Positive for congestion, postnasal drip, rhinorrhea, sinus pressure and sinus pain. Negative for sore throat.   Eyes: Negative for pain.  Respiratory: Negative for shortness of breath.   Cardiovascular: Negative for chest pain.  All other systems reviewed and are negative.    Physical Exam Updated Vital Signs BP 105/85 (BP Location: Right Arm)   Pulse 90   Temp 98.4 F (36.9 C) (Oral)   Resp 17   SpO2 99%   Physical Exam  Constitutional: She is oriented to person, place, and time. She appears well-developed and well-nourished.  HENT:  Head: Normocephalic and atraumatic.  Right Ear: Hearing, tympanic membrane and external ear normal. No drainage or tenderness. Tympanic membrane is not injected. No decreased hearing is noted.  Left Ear: Hearing, tympanic membrane and external ear normal. No drainage or tenderness. Tympanic membrane is not injected. No decreased hearing is noted.  Nose: Rhinorrhea present. No sinus tenderness. Right sinus exhibits frontal sinus tenderness. Left sinus exhibits frontal sinus tenderness.  Mouth/Throat: Uvula is midline and mucous membranes are normal. No dental abscesses or uvula swelling. Posterior oropharyngeal erythema present. No posterior oropharyngeal edema.  Erythema present on posterior oropharyngeal.  No edema, exudates, uvula is midline.  Patient reports some ear pressure after examining her ears there is no tenderness, drainage, swelling, injected TM or decreased TM mobility.  Eyes: Pupils are equal, round, and reactive to light.  Neck: Normal range of motion. Neck supple.  Cardiovascular: Normal heart sounds.  Pulmonary/Chest: Effort normal and breath sounds normal. She has no wheezes.  Breath sounds throughout all lung fields, no wheezing, rhonchi, rales.  Abdominal: Soft. Bowel sounds are  normal.  Musculoskeletal: She exhibits no tenderness or deformity.  Neurological: She is alert and oriented to person, place, and time.  Skin: Skin is warm and dry. Capillary refill takes less than 2 seconds.  Nursing note and vitals reviewed.    ED Treatments / Results  Labs (all labs ordered are listed, but only abnormal results are displayed) Labs Reviewed - No data to display  EKG None  Radiology No results found.  Procedures Procedures (including critical care time)  Medications Ordered in ED Medications - No data to display   Initial Impression / Assessment and Plan / ED Course  I have reviewed the triage vital signs and the nursing notes.  Pertinent labs & imaging results that were available during my care of the patient were reviewed by me and considered in my medical decision making (see chart for details).     Patient presents with URI symptoms x 1 day. Patient reports pain in frontal sinuses with palpation and rhinorrhea.  At this time have advised patient that she should try some over-the-counter expectorant such as with Fenesin to help with symptomatic relief.  I have also suggested some Robitussin for her dry cough.  Patient requests a work note, I will provide this for patient patient's vitals are stable, patient stable for discharge.  Final Clinical Impressions(s) / ED Diagnoses   Final diagnoses:  Viral URI with cough    ED Discharge Orders    None       Claude Manges, [name] 08/13/18 1111    Terrilee Files, MD 08/14/18 [phone]

## 2018-08-25 ENCOUNTER — Ambulatory Visit: Payer: Medicaid Other

## 2018-08-25 ENCOUNTER — Emergency Department (HOSPITAL_COMMUNITY)
Admission: EM | Admit: 2018-08-25 | Discharge: 2018-08-25 | Disposition: A | Discharge status: Home / Self Care | Payer: Self-pay | Attending: Emergency Medicine | Admitting: Emergency Medicine

## 2018-08-25 ENCOUNTER — Encounter (HOSPITAL_COMMUNITY): Payer: Self-pay

## 2018-08-25 ENCOUNTER — Emergency Department (HOSPITAL_COMMUNITY): Payer: Self-pay

## 2018-08-25 ENCOUNTER — Other Ambulatory Visit: Payer: Self-pay

## 2018-08-25 ENCOUNTER — Telehealth: Payer: Self-pay | Admitting: *Deleted

## 2018-08-25 DIAGNOSIS — Z79899 Other long term (current) drug therapy: Secondary | ICD-10-CM | POA: Insufficient documentation

## 2018-08-25 DIAGNOSIS — K219 Gastro-esophageal reflux disease without esophagitis: Secondary | ICD-10-CM | POA: Insufficient documentation

## 2018-08-25 DIAGNOSIS — R1013 Epigastric pain: Secondary | ICD-10-CM | POA: Insufficient documentation

## 2018-08-25 DIAGNOSIS — Z8719 Personal history of other diseases of the digestive system: Secondary | ICD-10-CM

## 2018-08-25 LAB — COMPREHENSIVE METABOLIC PANEL
ALT: 12 U/L (ref 0–44)
AST: 9 U/L — ABNORMAL LOW (ref 15–41)
Albumin: 3.4 g/dL — ABNORMAL LOW (ref 3.5–5.0)
Alkaline Phosphatase: 60 U/L (ref 38–126)
Anion gap: 8 (ref 5–15)
BILIRUBIN TOTAL: 0.8 mg/dL (ref 0.3–1.2)
BUN: 9 mg/dL (ref 6–20)
CO2: 26 mmol/L (ref 22–32)
CREATININE: 1.05 mg/dL — AB (ref 0.44–1.00)
Calcium: 8.8 mg/dL — ABNORMAL LOW (ref 8.9–10.3)
Chloride: 106 mmol/L (ref 98–111)
GFR calc Af Amer: >60 mL/min (ref >60)
GFR calc non Af Amer: >60 mL/min (ref >60)
Glucose, Bld: 97 mg/dL (ref 70–99)
Potassium: 4 mmol/L (ref 3.5–5.1)
Sodium: 140 mmol/L (ref 135–145)
TOTAL PROTEIN: 7 g/dL (ref 6.5–8.1)

## 2018-08-25 LAB — CBC
HCT: 35.2 % — ABNORMAL LOW (ref 36.0–46.0)
Hemoglobin: 11.3 g/dL — ABNORMAL LOW (ref 12.0–15.0)
MCH: 27.4 pg (ref 26.0–34.0)
MCHC: 32.1 g/dL (ref 30.0–36.0)
MCV: 85.4 fL (ref 78.0–100.0)
PLATELETS: 341 10*3/uL (ref 150–400)
RBC: 4.12 MIL/uL (ref 3.87–5.11)
RDW: 14.5 % (ref 11.5–15.5)
WBC: 8.6 10*3/uL (ref 4.0–10.5)

## 2018-08-25 LAB — I-STAT BETA HCG BLOOD, ED (MC, WL, AP ONLY): I-stat hCG, quantitative: <5 m[IU]/mL (ref <5)

## 2018-08-25 LAB — LIPASE, BLOOD: Lipase: 20 U/L (ref 11–51)

## 2018-08-25 MED ORDER — GI COCKTAIL ~~LOC~~
30.0000 mL | Freq: Once | ORAL | Status: AC
  Administered 2018-08-25: 30 mL via ORAL
  Filled 2018-08-25: qty 30

## 2018-08-25 MED ORDER — OMEPRAZOLE 20 MG PO CPDR: 20.0000 mg | DELAYED_RELEASE_CAPSULE | Freq: Every day | ORAL | 0 refills | Status: DC

## 2018-08-25 MED ORDER — SUCRALFATE 1 GM/10ML PO SUSP: 1.0000 g | Freq: Three times a day (TID) | ORAL | 0 refills | Status: DC

## 2018-08-25 MED ORDER — SUCRALFATE 1 G PO TABS: 1.0000 g | ORAL_TABLET | Freq: Four times a day (QID) | ORAL | 0 refills | Status: DC

## 2018-08-25 NOTE — ED Notes (Signed)
Pt alert and oriented in NAD. Pt verbalized understanding of discharge instructions. 

## 2018-08-25 NOTE — Discharge Instructions (Signed)
If you develop worsening or new concerning symptoms you can return to the emergency department for re-evaluation.  ° °

## 2018-08-25 NOTE — ED Notes (Signed)
Patient transported to X-ray 

## 2018-08-25 NOTE — ED Provider Notes (Signed)
[facility] Provider Note   CSN: [id number] Arrival date & time: 08/25/18  [id number]     History   Chief Complaint Chief Complaint  Patient presents with  . food stuck/SOB    HPI Veronica [name] is a 27 y.o. female with a history of GERD, obesity who presents emergency department today for sensation of food being stuck.  Patient reports for the last 2 months she has been having sensation of food being stuck in her distal esophagus after she eats.  She reports afterwards she regurgitates her food and has burping and belching.  She reports this does relieve her symptoms.  She notes her symptoms are worse when she lies down the immedietley after eating.  She reports if she does not lie down after eating her symptoms do not occur.  She reports this feels similar to her previous episodes of GERD but is out of all of her medications.  She has been on Nexium, famotidine and Carafate in the past.   She denies any associated fever, abdominal pain, chest pain, nausea/vomiting.  Patient does report that at times when the pressure in her epigastric area is severe she feels short of breath because the food feels lodged.  She denies any shortness of breath with exertion.  She denies any chest pain.  No associated nausea, emesis, diaphoresis.  She has not tried anything for her symptoms. Denies risk factors for PE including exogenous estrogen use, recent surgery or travel, trauma, immobilization, previous blood clot, cough, hemoptysis, cancer, lower extremity pain or swelling.   HPI  Past Medical History:  Diagnosis Date  . Anemia   . Ankle pain   . GERD (gastroesophageal reflux disease)   . Hiatal hernia   . Obesity (BMI 30-39.9)     Patient Active Problem List   Diagnosis Date Noted  . Screening for malignant neoplasm of cervix 03/27/2018  . Secondary amenorrhea 03/25/2018  . Seasonal allergies 03/25/2018  . Abdominal pain 01/06/2012  . Birth control  counseling 08/26/2011  . GERD (gastroesophageal reflux disease) 03/28/2011  . Obesity (BMI 30-39.9) 02/27/2011  . Anemia 02/27/2011    Past Surgical History:  Procedure Laterality Date  . CESAREAN SECTION  09/02/2010   For macrosomia, but son was 7 lb 12 oz  . TONSILLECTOMY AND ADENOIDECTOMY       OB History   None      Home Medications    Prior to Admission medications   Medication Sig Start Date End Date Taking? Authorizing Provider  acetaminophen (TYLENOL) 500 MG tablet Take 1 tablet (500 mg total) by mouth every 6 (six) hours as needed. Patient taking differently: Take 500 mg every 6 (six) hours as needed by mouth (for pain or headaches).  07/12/16  Yes Dan HumphreysIrick, Michael, MD  albuterol (PROVENTIL HFA;VENTOLIN HFA) 108 (90 Base) MCG/ACT inhaler Inhale 2 puffs into the lungs every 4 (four) hours as needed for wheezing or shortness of breath (cough). Patient taking differently: Inhale 2 puffs every 4 (four) hours as needed into the lungs (coughing, shortness of breath, or wheezing).  10/09/17  Yes Street, [place], [name]  esomeprazole (NEXIUM) 20 MG capsule Take 20 mg by mouth daily. Over the counter   Yes [provider]  sucralfate (CARAFATE) 1 g tablet Take 1 tablet (1 g total) by mouth 4 (four) times daily -  with meals and at bedtime. 05/17/18  Yes Mikell, Antionette PolesAsiyah Zahra, MD  artificial tears (LACRILUBE) OINT ophthalmic ointment Place into the right  eye every 4 (four) hours as needed for dry eyes. Patient not taking: Reported on 10/19/2017 09/27/16   Charlestine Night, [name]  benzonatate (TESSALON) 200 MG capsule Take 1 capsule (200 mg total) by mouth 3 (three) times daily as needed for cough. Patient not taking: Reported on 08/25/2018 02/10/18   Palma Holter, MD  cetirizine (ZYRTEC) 10 MG tablet Take 1 tablet (10 mg total) by mouth daily. Patient not taking: Reported on 08/25/2018 04/09/18   Berton Bon, MD  diclofenac (VOLTAREN) 75 MG EC tablet TAKE 1 TABLET BY  MOUTH TWICE DAILY WITH FOOD Patient not taking: Reported on 08/25/2018 08/06/18   Jamelle Rushing L, DO  famotidine (PEPCID) 20 MG tablet Take 1 tablet (20 mg total) by mouth 2 (two) times daily. Patient not taking: Reported on 08/25/2018 04/09/18   Berton Bon, MD  Fe Fum-FePoly-FA-Vit C-Vit B3 (INTEGRA F) 125-1 MG CAPS Take 1 capsule by mouth daily. Patient not taking: Reported on 08/25/2018 04/29/17   Berton Bon, MD  fluticasone [facility]) 50 MCG/ACT nasal spray Place 2 sprays into both nostrils daily. Patient not taking: Reported on 08/25/2018 03/25/18   Berton Bon, MD  potassium chloride SA (K-DUR,KLOR-CON) 20 MEQ tablet Take 1 tablet (20 mEq total) 2 (two) times daily by mouth. Patient not taking: Reported on 08/25/2018 10/19/17   Roxy Horseman, [name]    Family History Family History  Problem Relation Age of Onset  . Diabetes Mother   . Hypertension Mother   . Obesity Mother   . Obesity Sister   . Osteochondroma Sister   . Diabetes Sister   . Hypertension Sister     Social History Social History   Tobacco Use  . Smoking status: Never Smoker  . Smokeless tobacco: Never Used  Substance Use Topics  . Alcohol use: No  . Drug use: No     Allergies   Morphine and related and Motrin [ibuprofen]   Review of Systems Review of Systems  All other systems reviewed and are negative.    Physical Exam Updated Vital Signs BP 114/71   Pulse 82   Temp 98.8 F (37.1 C) (Oral)   Resp 18   SpO2 94%   Physical Exam  Constitutional: She appears well-developed and well-nourished.  HENT:  Head: Normocephalic and atraumatic.  Right Ear: External ear normal.  Left Ear: External ear normal.  Nose: Nose normal.  Mouth/Throat: Uvula is midline, oropharynx is clear and moist and mucous membranes are normal. No tonsillar exudate.  The patient has normal phonation and is in control of secretions. No stridor.  Midline uvula without edema. Soft palate rises  symmetrically.  No tonsillar erythema or exudates. No PTA. Tongue protrusion is normal. No trismus. No creptius on neck palpation and patient has good dentition. No gingival erythema or fluctuance noted. Mucus membranes moist.   Eyes: Pupils are equal, round, and reactive to light. Right eye exhibits no discharge. Left eye exhibits no discharge. No scleral icterus.  Neck: Trachea normal. Neck supple. No spinous process tenderness present. No neck rigidity. Normal range of motion present.  Cardiovascular: Normal rate, regular rhythm and intact distal pulses.  No murmur heard. Pulses:      Radial pulses are 2+ on the right side, and 2+ on the left side.       Dorsalis pedis pulses are 2+ on the right side, and 2+ on the left side.       Posterior tibial pulses are 2+ on the right side,  and 2+ on the left side.  No lower extremity swelling or edema. Calves symmetric in size bilaterally.  Pulmonary/Chest: Effort normal and breath sounds normal. She exhibits no tenderness.  Abdominal: Soft. Bowel sounds are normal. She exhibits no distension. There is no tenderness. There is no rigidity, no rebound, no guarding and no CVA tenderness.  Musculoskeletal: She exhibits no edema.  Lymphadenopathy:    She has no cervical adenopathy.  Neurological: She is alert.  Skin: Skin is warm and dry. No rash noted. She is not diaphoretic.  Psychiatric: She has a normal mood and affect.  Nursing note and vitals reviewed.    ED Treatments / Results  Labs (all labs ordered are listed, but only abnormal results are displayed) Labs Reviewed  COMPREHENSIVE METABOLIC PANEL - Abnormal; Notable for the following components:      Result Value   Creatinine, Ser 1.05 (*)    Calcium 8.8 (*)    Albumin 3.4 (*)    AST 9 (*)    All other components within normal limits  CBC - Abnormal; Notable for the following components:   Hemoglobin 11.3 (*)    HCT 35.2 (*)    All other components within normal limits  LIPASE,  BLOOD  I-STAT BETA HCG BLOOD, ED ([facility], [facility], [facility] ONLY)    EKG None  Radiology Dg Abd Acute W/chest  Result Date: 08/25/2018 CLINICAL DATA:  27 year old female with epigastric pain for 1 month. Sensation of food lodging in her throat, with associated shortness of breath. EXAM: DG ABDOMEN ACUTE W/ 1V CHEST COMPARISON:  Chest radiographs 01/23/2018.  Chest CTA 07/16/2017. FINDINGS: Mildly low lung volumes. Mediastinal contours remain normal. Visualized tracheal air column is within normal limits. No pneumothorax or pneumoperitoneum. No confluent pulmonary opacity. Upright and supine views of the abdomen and pelvis. Non obstructed bowel gas pattern. Normal abdominal visceral contours. Incidental pelvic phleboliths. No osseous abnormality identified. IMPRESSION: Negative chest and abdominal radiographs. Electronically Signed   By: Odessa Fleming M.D.   On: 08/25/2018 12:03    Procedures Procedures (including critical care time)  Medications Ordered in ED Medications  gi cocktail (Maalox,Lidocaine,Donnatal) (30 mLs Oral Given 08/25/18 1135)     Initial Impression / Assessment and Plan / ED Course  I have reviewed the triage vital signs and the nursing notes.  Pertinent labs & imaging results that were available during my care of the patient were reviewed by me and considered in my medical decision making (see chart for details).     27 y.o. female with sensation of food getting stuck in her distal esophagus after eating.  She denies any chest pain.  She reports shortness of breath in the past when the area becomes tight from the food feeling lodged.  She reports is usually in the epigastric area.  States this feels like her previous episodes of GERD.  She has not been taking anything for symptoms. Patient with improvement after GI cocktail.  Chest and abdominal x-ray unremarkable.  Patient tolerating p.o. fluids.  There is no evidence of PTA or RPA.  She is in control of her secretions.  Do not feel she  needs emergent endoscopy.  Patient denies any chest pain, exertional shortness of breath.  EKG NSR.  No evidence of STEMI.  Do not suspect ACS.  This is been ongoing for approximately 1 month.  Patient is PERC negative.  Do not suspect PE.  Labs reviewed and reassuring.  Patient without right upper quadrant tenderness palpation.  Negative Eulah Pont  sign.  LFTs within normal limits.  Do not feel she needs a right upper quadrant ultrasound to evaluate for cholecystitis at this time.  Suspect the patient's symptoms are related to GERD.  Patient is currently symptom-free.    Will discharge patient home with omeprazole and carafate. I advised the patient to follow-up with PCP this week. Gi referral given for prn basis. Specific return precautions discussed. Time was given for all questions to be answered. The patient verbalized understanding and agreement with plan. The patient appears safe for discharge home.  Final Clinical Impressions(s) / ED Diagnoses   Final diagnoses:  Epigastric abdominal pain  History of gastroesophageal reflux (GERD)    ED Discharge Orders         Ordered    omeprazole (PRILOSEC) 20 MG capsule  Daily     08/25/18 1458    sucralfate (CARAFATE) 1 GM/10ML suspension  3 times daily with meals & bedtime     08/25/18 1458           Princella Pellegrini 08/25/18 1645    Azalia Bilis, MD 08/26/18 0800

## 2018-08-25 NOTE — Telephone Encounter (Signed)
Pt calls because she went to the ED and they Rx'd her suspension carafate.  This will cost her $200, she is requesting the tablet because in the past it has cost her $30.  Verbal received from Dr. Deirdre Priesthambliss to call in carafate. Fleeger, Maryjo RochesterJessica Dawn, CMA

## 2018-08-25 NOTE — ED Triage Notes (Signed)
Patient complains of epigastric pain x 1 month. States that the food is feeling like getting stuck in her throat. SOB feeling due to the food lodging. Patient alert and oriented, NAD

## 2018-09-07 ENCOUNTER — Ambulatory Visit (INDEPENDENT_AMBULATORY_CARE_PROVIDER_SITE_OTHER): Payer: Self-pay

## 2018-09-07 DIAGNOSIS — Z111 Encounter for screening for respiratory tuberculosis: Secondary | ICD-10-CM

## 2018-09-07 NOTE — Progress Notes (Signed)
   Tuberculin skin test applied to right ventral forearm. Appointment made for 09/09/18 for PPD reading.  Ples Specter, RN [facility] RN)

## 2018-09-09 ENCOUNTER — Ambulatory Visit (INDEPENDENT_AMBULATORY_CARE_PROVIDER_SITE_OTHER): Payer: Self-pay | Admitting: *Deleted

## 2018-09-09 DIAGNOSIS — Z111 Encounter for screening for respiratory tuberculosis: Secondary | ICD-10-CM

## 2018-09-09 LAB — TB SKIN TEST
INDURATION: 0 mm
TB SKIN TEST: NEGATIVE

## 2018-09-09 NOTE — Progress Notes (Signed)
Patient is here for a PPD read.  It was placed on 09/07/18 in the right forearm @ 9 am.    PPD RESULTS:  Result: negative Induration: 0 mm  Letter created and given to patient for documentation purposes.  Letter also placed in fax pile for pts work @ [phone] Fleeger, Maryjo Rochester, CMA

## 2018-10-26 ENCOUNTER — Other Ambulatory Visit: Payer: Self-pay | Admitting: Family Medicine

## 2018-10-26 MED ORDER — OMEPRAZOLE 20 MG PO CPDR: 20.0000 mg | DELAYED_RELEASE_CAPSULE | Freq: Every day | ORAL | 0 refills | Status: DC

## 2018-10-26 NOTE — Telephone Encounter (Signed)
Pt would like to have her Omeprazole refilled. She said she is completely out.

## 2018-11-12 ENCOUNTER — Ambulatory Visit (INDEPENDENT_AMBULATORY_CARE_PROVIDER_SITE_OTHER): Payer: Self-pay | Admitting: Family Medicine

## 2018-11-12 ENCOUNTER — Encounter: Payer: Self-pay | Admitting: Family Medicine

## 2018-11-12 ENCOUNTER — Other Ambulatory Visit: Payer: Self-pay

## 2018-11-12 DIAGNOSIS — R519 Headache, unspecified: Secondary | ICD-10-CM

## 2018-11-12 DIAGNOSIS — R51 Headache: Secondary | ICD-10-CM

## 2018-11-12 NOTE — Patient Instructions (Signed)
It was a wonderful seeing you today!  You were seen for increasing headaches.  I provided you some exercises to help with your jaw in case this is the cause of your headaches.  I recommend you try Excedrin headache over-the-counter to help with the pain.  Also trying to decrease the amount of caffeine you drink, 8 hours of sleep at night, and may try ice/heat on your head for 20 minutes at a time.  Also a great excuse to get yourself a massage!  I hope you have a wonderful holiday!

## 2018-11-15 ENCOUNTER — Encounter: Payer: Self-pay | Admitting: Family Medicine

## 2018-11-15 DIAGNOSIS — R51 Headache: Secondary | ICD-10-CM

## 2018-11-15 DIAGNOSIS — R519 Headache, unspecified: Secondary | ICD-10-CM | POA: Insufficient documentation

## 2018-11-15 NOTE — Assessment & Plan Note (Signed)
Question tension type vs TMJ dysfunction vs diurnal shift HA vs migraines, however believe headaches are likely multifactorial.  No alarm symptoms at this time.  Patient has recently switched to dayshift and has noticed an increase in her headaches since this, also has tension within her neck musculature and shift of her jaw with mouth opening.  Performed occipital release and TMJ muscle energy with improvement in tension and jaw opening, patient tolerated well.  -Provided with TMJ exercises for patient to do daily -Recommend Excedrin headache OTC to try when she feels the onset of HA -Recommend heat to the area for 20 minutes at a time - Also discussed with patient to reduce caffeine intake and a good night's rest that may also be contributing - Follow-up in 1 month or sooner if symptoms worsen or not improving

## 2018-11-15 NOTE — Progress Notes (Signed)
Subjective:    Patient ID: Veronica [name], female    DOB: 30-Mar-1991, 27 y.o.   MRN: [id number]   CC: Headache  HPI: Ms. Veronica [name] is a 27 year old presenting to discuss the following:  Daily headaches: Patient reports an insidious onset of headaches daily for the past month.  She states the headache is dull/throbbing in nature, varies between her left/right temporal or in a bandlike fashion across her forehead.  She has been getting a headache at least once a day, lasting for 30-45 minutes, however on average is having 2 a day.  Endorses occasional phonophobia and photophobia, but denies any recent illness, fever, rhinorrhea, eye wateriness, nausea, vomiting, weakness, change in vision or hearing, denies, or numbness.  She has been trying ibuprofen 200 mg, max is taken up to 3 with her headaches.  She has about 2 large sweet teas for [employer] a day (this is not new) and recently switched to a day job about 3 weeks ago. She does notice that her headache got worse after switching to the daytime.  LMP last Monday, no concerns for pregnancy.  No new medications, denies any alcohol or illicit drug use.    Smoking status reviewed  Review of Systems Per HPI, also denies recent illness, fever, changes in vision, chest pain, shortness of breath, abdominal pain, N/V/D, weakness   Patient Active Problem List   Diagnosis Date Noted  . Headache 11/15/2018  . Screening for malignant neoplasm of cervix 03/27/2018  . Secondary amenorrhea 03/25/2018  . Seasonal allergies 03/25/2018  . Abdominal pain 01/06/2012  . Birth control counseling 08/26/2011  . GERD (gastroesophageal reflux disease) 03/28/2011  . Obesity (BMI 30-39.9) 02/27/2011  . Anemia 02/27/2011     Objective:  BP 120/78   Temp 98.9 F (37.2 C) (Oral)   Wt (!) 306 lb (138.8 kg)   LMP 11/08/2018 (Approximate)   BMI 55.97 kg/m  Vitals and nursing note reviewed  General: NAD, pleasant Cardiac: RRR, normal heart sounds, no  murmurs Respiratory: CTAB, normal effort Abdomen: soft large abdomen, nontender, nondistended Extremities: no edema or cyanosis. WWP. Skin: warm and dry, no rashes noted Neuro: alert and oriented, no focal deficits, 5/5 strength upper and lower extremities, PERRL MSK: Posterior neck musculature slightly tense with palpation however difficult to fully assess due to body habitus, no red reflex. Shift of jaw to the right with mouth opening, slight click felt in her TMJ laterally with transition of her jaw to the right.   Psych: normal affect  Performed occipital release and TMJ muscle energy, with patient's verbal consent.  Assessment & Plan:    Headache Question tension type vs TMJ dysfunction vs diurnal shift HA vs migraines, however believe headaches are likely multifactorial.  No alarm symptoms at this time.  Patient has recently switched to dayshift and has noticed an increase in her headaches since this, also has tension within her neck musculature and shift of her jaw with mouth opening.  Performed occipital release and TMJ muscle energy with improvement in tension and jaw opening, patient tolerated well.  -Provided with TMJ exercises for patient to do daily -Recommend Excedrin headache OTC to try when she feels the onset of HA -Recommend heat to the area for 20 minutes at a time - Also discussed with patient to reduce caffeine intake and a good night's rest that may also be contributing - Follow-up in 1 month or sooner if symptoms worsen or not improving    Leticia Penna, DO Family Medicine Resident  PGY-1

## 2018-11-16 ENCOUNTER — Emergency Department (HOSPITAL_COMMUNITY)
Admission: EM | Admit: 2018-11-16 | Discharge: 2018-11-16 | Disposition: A | Discharge status: Home / Self Care | Payer: Self-pay | Attending: Emergency Medicine | Admitting: Emergency Medicine

## 2018-11-16 ENCOUNTER — Emergency Department (HOSPITAL_COMMUNITY): Payer: Self-pay

## 2018-11-16 ENCOUNTER — Other Ambulatory Visit: Payer: Self-pay

## 2018-11-16 ENCOUNTER — Encounter (HOSPITAL_COMMUNITY): Payer: Self-pay | Admitting: Internal Medicine

## 2018-11-16 DIAGNOSIS — R0789 Other chest pain: Secondary | ICD-10-CM | POA: Insufficient documentation

## 2018-11-16 DIAGNOSIS — J302 Other seasonal allergic rhinitis: Secondary | ICD-10-CM | POA: Insufficient documentation

## 2018-11-16 LAB — BASIC METABOLIC PANEL
Anion gap: 11 (ref 5–15)
BUN: 9 mg/dL (ref 6–20)
CO2: 22 mmol/L (ref 22–32)
Calcium: 8.9 mg/dL (ref 8.9–10.3)
Chloride: 105 mmol/L (ref 98–111)
Creatinine, Ser: 1.07 mg/dL — ABNORMAL HIGH (ref 0.44–1.00)
GFR calc Af Amer: >60 mL/min (ref >60)
GFR calc non Af Amer: >60 mL/min (ref >60)
GLUCOSE: 83 mg/dL (ref 70–99)
POTASSIUM: 3.9 mmol/L (ref 3.5–5.1)
Sodium: 138 mmol/L (ref 135–145)

## 2018-11-16 LAB — CBC
HEMATOCRIT: 35.1 % — AB (ref 36.0–46.0)
HEMOGLOBIN: 11.1 g/dL — AB (ref 12.0–15.0)
MCH: 27 pg (ref 26.0–34.0)
MCHC: 31.6 g/dL (ref 30.0–36.0)
MCV: 85.4 fL (ref 80.0–100.0)
Platelets: 346 10*3/uL (ref 150–400)
RBC: 4.11 MIL/uL (ref 3.87–5.11)
RDW: 14.1 % (ref 11.5–15.5)
WBC: 9.3 10*3/uL (ref 4.0–10.5)
nRBC: 0 % (ref 0.0–0.2)

## 2018-11-16 LAB — I-STAT BETA HCG BLOOD, ED (MC, WL, AP ONLY): I-stat hCG, quantitative: <5 m[IU]/mL (ref <5)

## 2018-11-16 LAB — I-STAT TROPONIN, ED: Troponin i, poc: 0 ng/mL (ref 0.00–0.08)

## 2018-11-16 MED ORDER — ACETAMINOPHEN 325 MG PO TABS
650.0000 mg | ORAL_TABLET | Freq: Once | ORAL | Status: AC
  Administered 2018-11-16: 650 mg via ORAL
  Filled 2018-11-16: qty 2

## 2018-11-16 NOTE — ED Triage Notes (Signed)
Pt here from home c/o shortness of breath and chest pain for about 1.5 weeks. Pt states that she has been increasingly short of breath when she takes a couple of steps.  States pain relief with ibuprofen. CP is achy and sharp in nature. No caridac hx.

## 2018-11-16 NOTE — Discharge Instructions (Addendum)
Continue tylenol and motrin for pain.

## 2018-11-16 NOTE — ED Provider Notes (Signed)
[facility]   CSN: [id number] Arrival date & time: 11/16/18  0756     History   Chief Complaint Chief Complaint  Patient presents with  . Chest Pain  . Shortness of Breath    HPI Veronica [name] is a 27 y.o. female.  The history is provided by the patient.  Chest Pain   This is a new problem. The current episode started more than 2 days ago. The problem occurs daily. Progression since onset: waxing and waning. The pain is associated with movement and raising an arm. The pain is present in the substernal region. The pain is at a severity of 2/10. The pain is mild. The quality of the pain is described as dull. The pain does not radiate. The symptoms are aggravated by certain positions. Associated symptoms include cough and shortness of breath. Pertinent negatives include no abdominal pain, no back pain, no fever, no palpitations and no vomiting. Treatments tried: motrin. The treatment provided mild relief. Risk factors include obesity.  Pertinent negatives for past medical history include no CAD, no COPD, no CHF, no DVT, no hyperlipidemia, no hypertension, no PE and no seizures.  Her family medical history is significant for CAD.    Past Medical History:  Diagnosis Date  . Abdominal pain 01/06/2012  . Anemia   . Ankle pain   . GERD (gastroesophageal reflux disease)   . Hiatal hernia   . Obesity (BMI 30-39.9)     Patient Active Problem List   Diagnosis Date Noted  . Headache 11/15/2018  . Screening for malignant neoplasm of cervix 03/27/2018  . Secondary amenorrhea 03/25/2018  . Seasonal allergies 03/25/2018  . Birth control counseling 08/26/2011  . GERD (gastroesophageal reflux disease) 03/28/2011  . Obesity (BMI 30-39.9) 02/27/2011  . Anemia 02/27/2011    Past Surgical History:  Procedure Laterality Date  . CESAREAN SECTION  09/02/2010   For macrosomia, but son was 7 lb 12 oz  . TONSILLECTOMY AND ADENOIDECTOMY         OB History   None      Home Medications    Prior to Admission medications   Medication Sig Start Date End Date Taking? Authorizing Provider  acetaminophen (TYLENOL) 500 MG tablet Take 1 tablet (500 mg total) by mouth every 6 (six) hours as needed. Patient taking differently: Take 500 mg every 6 (six) hours as needed by mouth (for pain or headaches).  07/12/16   Dan HumphreysIrick, Michael, MD  albuterol (PROVENTIL HFA;VENTOLIN HFA) 108 (90 Base) MCG/ACT inhaler Inhale 2 puffs into the lungs every 4 (four) hours as needed for wheezing or shortness of breath (cough). Patient taking differently: Inhale 2 puffs every 4 (four) hours as needed into the lungs (coughing, shortness of breath, or wheezing).  10/09/17   Street, NoreneMercedes, [name]  artificial tears (LACRILUBE) OINT ophthalmic ointment Place into the right eye every 4 (four) hours as needed for dry eyes. Patient not taking: Reported on 10/19/2017 09/27/16   Charlestine NightLawyer, Christopher, [name]  benzonatate (TESSALON) 200 MG capsule Take 1 capsule (200 mg total) by mouth 3 (three) times daily as needed for cough. Patient not taking: Reported on 08/25/2018 02/10/18   Palma HolterGunadasa, Kanishka G, MD  cetirizine (ZYRTEC) 10 MG tablet Take 1 tablet (10 mg total) by mouth daily. Patient not taking: Reported on 08/25/2018 04/09/18   Berton BonMikell, Asiyah Zahra, MD  diclofenac (VOLTAREN) 75 MG EC tablet TAKE 1 TABLET BY MOUTH TWICE DAILY WITH FOOD Patient not taking: Reported  on 08/25/2018 08/06/18   Jamelle Rushing L, DO  esomeprazole (NEXIUM) 20 MG capsule Take 20 mg by mouth daily. Over the counter    [provider]  famotidine (PEPCID) 20 MG tablet Take 1 tablet (20 mg total) by mouth 2 (two) times daily. Patient not taking: Reported on 08/25/2018 04/09/18   Berton Bon, MD  Fe Fum-FePoly-FA-Vit C-Vit B3 (INTEGRA F) 125-1 MG CAPS Take 1 capsule by mouth daily. Patient not taking: Reported on 08/25/2018 04/29/17   Berton Bon, MD  fluticasone [facility]) 50  MCG/ACT nasal spray Place 2 sprays into both nostrils daily. Patient not taking: Reported on 08/25/2018 03/25/18   Berton Bon, MD  omeprazole (PRILOSEC) 20 MG capsule Take 1 capsule (20 mg total) by mouth daily. 10/26/18   Allayne Stack, DO  potassium chloride SA (K-DUR,KLOR-CON) 20 MEQ tablet Take 1 tablet (20 mEq total) 2 (two) times daily by mouth. Patient not taking: Reported on 08/25/2018 10/19/17   Roxy Horseman, [name]  sucralfate (CARAFATE) 1 g tablet Take 1 tablet (1 g total) by mouth 4 (four) times daily. 08/25/18   Carney Living, MD    Family History Family History  Problem Relation Age of Onset  . Diabetes Mother   . Hypertension Mother   . Obesity Mother   . Obesity Sister   . Osteochondroma Sister   . Diabetes Sister   . Hypertension Sister     Social History Social History   Tobacco Use  . Smoking status: Never Smoker  . Smokeless tobacco: Never Used  Substance Use Topics  . Alcohol use: No  . Drug use: No     Allergies   Morphine and related and Motrin [ibuprofen]   Review of Systems Review of Systems  Constitutional: Negative for chills and fever.  HENT: Negative for ear pain and sore throat.   Eyes: Negative for pain and visual disturbance.  Respiratory: Positive for cough and shortness of breath.   Cardiovascular: Positive for chest pain. Negative for palpitations.  Gastrointestinal: Negative for abdominal pain and vomiting.  Genitourinary: Negative for dysuria and hematuria.  Musculoskeletal: Negative for arthralgias and back pain.  Skin: Negative for color change and rash.  Neurological: Negative for seizures and syncope.  All other systems reviewed and are negative.    Physical Exam Updated Vital Signs  ED Triage Vitals  Enc Vitals Group     BP 11/16/18 0815 (!) 111/49     Pulse Rate 11/16/18 0807 89     Resp 11/16/18 0807 (!) 22     Temp --      Temp src --      SpO2 11/16/18 0815 100 %     Weight --      Height  --      Head Circumference --      Peak Flow --      Pain Score 11/16/18 0806 7     Pain Loc --      Pain Edu? --      Excl. in GC? --     Physical Exam  Constitutional: She appears well-developed and well-nourished. No distress.  HENT:  Head: Normocephalic and atraumatic.  Eyes: Pupils are equal, round, and reactive to light. Conjunctivae and EOM are normal.  Neck: Normal range of motion. Neck supple.  Cardiovascular: Normal rate, regular rhythm, intact distal pulses and normal pulses.  No murmur heard. Pulmonary/Chest: Effort normal and breath sounds normal. No respiratory distress.  Abdominal: Soft. Bowel sounds  are normal. There is no tenderness.  Musculoskeletal: She exhibits no edema.       Right lower leg: She exhibits no edema.       Left lower leg: She exhibits no edema.  Neurological: She is alert.  Skin: Skin is warm and dry.  Psychiatric: She has a normal mood and affect.  Nursing note and vitals reviewed.    ED Treatments / Results  Labs (all labs ordered are listed, but only abnormal results are displayed) Labs Reviewed  BASIC METABOLIC PANEL - Abnormal; Notable for the following components:      Result Value   Creatinine, Ser 1.07 (*)    All other components within normal limits  CBC - Abnormal; Notable for the following components:   Hemoglobin 11.1 (*)    HCT 35.1 (*)    All other components within normal limits  I-STAT TROPONIN, ED  I-STAT BETA HCG BLOOD, ED ([facility], [facility], [facility] ONLY)    EKG EKG Interpretation  Date/Time:  Tuesday November 16 2018 08:07:03 EST Ventricular Rate:  96 PR Interval:    QRS Duration: 86 QT Interval:  347 QTC Calculation: 439 R Axis:   45 Text Interpretation:  Sinus rhythm Baseline wander in lead(s) II III aVF V5 V6 Confirmed by Virgina Norfolk [phone]) on 11/16/2018 8:29:40 AM   Radiology Dg Chest 2 View  Result Date: 11/16/2018 CLINICAL DATA:  Shortness of breath.  Chest pain. EXAM: CHEST - 2 VIEW COMPARISON:   08/25/2018. FINDINGS: Mediastinum and hilar structures normal. Low lung volumes with mild bibasilar atelectasis. No pleural effusion or pneumothorax. Heart size normal. No acute bony abnormality. IMPRESSION: Low lung volumes with mild bibasilar atelectasis. Electronically Signed   By: Maisie Fus  Register   On: 11/16/2018 09:08    Procedures Procedures (including critical care time)  Medications Ordered in ED Medications  acetaminophen (TYLENOL) tablet 650 mg (650 mg Oral Given 11/16/18 2956)     Initial Impression / Assessment and Plan / ED Course  I have reviewed the triage vital signs and the nursing notes.  Pertinent labs & imaging results that were available during my care of the patient were reviewed by me and considered in my medical decision making (see chart for details).     Dajiah Kooi Raftery is a 27 year old female with no significant medical history who presents to the ED with chest pain, shortness of breath.  Patient with normal vitals.  No fever.  Patient with intermittent pain for the last several days.  Worse to palpation.  Worse when she coughs or takes a deep breath.  Has had some cough but no sputum production.  Possibly some repetitive motion activity causing chest wall pain.  Patient is PERC negative and doubt PE.  Patient has no cardiac risk factors except for some heart disease in her family.  EKG shows sinus rhythm no signs of ischemic changes.  Unchanged from prior.  Troponin within normal limits.  Patient with tenderness over the anterior chest wall that is likely from muscle strain.  Possibly from coughing as well.  Chest x-ray showed no signs of pneumonia, pneumothorax, pleural effusion.  Patient with no significant anemia, electrolyte abnormality, kidney injury.  Overall unremarkable work-up.  Suspect muscle strain, given Tylenol.  States that she has had improvement with Motrin already.  Recommend continued use as needed.  Told to return to the hospital if symptoms worsen  and recommend follow-up with primary care doctor.  Patient discharged from ED in good condition.  This chart was  dictated using voice recognition software.  Despite best efforts to proofread,  errors can occur which can change the documentation meaning.   Final Clinical Impressions(s) / ED Diagnoses   Final diagnoses:  Atypical chest pain    ED Discharge Orders    None       Virgina Norfolk, DO 11/16/18 1610

## 2018-11-27 ENCOUNTER — Other Ambulatory Visit: Payer: Self-pay

## 2018-11-27 ENCOUNTER — Encounter (HOSPITAL_COMMUNITY): Payer: Self-pay | Admitting: Emergency Medicine

## 2018-11-27 ENCOUNTER — Emergency Department (HOSPITAL_COMMUNITY)
Admission: EM | Admit: 2018-11-27 | Discharge: 2018-11-27 | Disposition: A | Discharge status: Home / Self Care | Payer: Medicaid Other | Attending: Emergency Medicine | Admitting: Emergency Medicine

## 2018-11-27 DIAGNOSIS — Z79899 Other long term (current) drug therapy: Secondary | ICD-10-CM | POA: Insufficient documentation

## 2018-11-27 DIAGNOSIS — J029 Acute pharyngitis, unspecified: Secondary | ICD-10-CM

## 2018-11-27 LAB — GROUP A STREP BY PCR: Group A Strep by PCR: NOT DETECTED

## 2018-11-27 MED ORDER — DEXAMETHASONE 4 MG PO TABS
10.0000 mg | ORAL_TABLET | Freq: Once | ORAL | Status: AC
  Administered 2018-11-27: 10 mg via ORAL
  Filled 2018-11-27: qty 3

## 2018-11-27 NOTE — ED Notes (Signed)
ED Provider at bedside. 

## 2018-11-27 NOTE — ED Triage Notes (Signed)
Pt states, "I've got a sore throat. I can't even swallow."  Pt reports s/s started this morning.

## 2018-11-27 NOTE — ED Notes (Signed)
Pt stable, ambulatory, states understanding of discharge instructions 

## 2018-11-27 NOTE — Discharge Instructions (Addendum)
Your strep test is negative.  I suspect this is viral at this time.  You can take ibuprofen and Tylenol alternating as prescribed over-the-counter, as needed for your pain.  Although, use your doctor's recommendation regarding your stomach upset with ibuprofen.  Use warm salt water gargles.  Please be reevaluated if you develop any locking of your jaw, muffled voice, asymmetry in the back of your throat, drooling, persistent fever 100.4, or any other concerning symptoms.

## 2018-11-27 NOTE — ED Provider Notes (Signed)
[facility] Provider Note   CSN: [id number] Arrival date & time: 11/27/18  0915     History   Chief Complaint Chief Complaint  Patient presents with  . Sore Throat    HPI Veronica [name] is a 27 y.o. female with history of obesity, GERD, anemia who presents with sore throat that began today.  Patient reports she cannot swallow.  She denies any fevers, congestion.  She reports she had a little bit of coughing yesterday, but it was in the setting of working with chemicals at work.  She has had no coughing today.  She reports taking ibuprofen this morning.  She denies any oral intercourse.  HPI  Past Medical History:  Diagnosis Date  . Abdominal pain 01/06/2012  . Anemia   . Ankle pain   . GERD (gastroesophageal reflux disease)   . Hiatal hernia   . Obesity (BMI 30-39.9)     Patient Active Problem List   Diagnosis Date Noted  . Headache 11/15/2018  . Screening for malignant neoplasm of cervix 03/27/2018  . Secondary amenorrhea 03/25/2018  . Seasonal allergies 03/25/2018  . Birth control counseling 08/26/2011  . GERD (gastroesophageal reflux disease) 03/28/2011  . Obesity (BMI 30-39.9) 02/27/2011  . Anemia 02/27/2011    Past Surgical History:  Procedure Laterality Date  . CESAREAN SECTION  09/02/2010   For macrosomia, but son was 7 lb 12 oz  . TONSILLECTOMY AND ADENOIDECTOMY       OB History   No obstetric history on file.      Home Medications    Prior to Admission medications   Medication Sig Start Date End Date Taking? Authorizing Provider  acetaminophen (TYLENOL) 500 MG tablet Take 1 tablet (500 mg total) by mouth every 6 (six) hours as needed. Patient taking differently: Take 500 mg every 6 (six) hours as needed by mouth (for pain or headaches).  07/12/16   Dan HumphreysIrick, Michael, MD  albuterol (PROVENTIL HFA;VENTOLIN HFA) 108 (90 Base) MCG/ACT inhaler Inhale 2 puffs into the lungs every 4 (four) hours as needed for wheezing  or shortness of breath (cough). Patient taking differently: Inhale 2 puffs every 4 (four) hours as needed into the lungs (coughing, shortness of breath, or wheezing).  10/09/17   Street, [place]Mercedes, [name]  artificial tears (LACRILUBE) OINT ophthalmic ointment Place into the right eye every 4 (four) hours as needed for dry eyes. Patient not taking: Reported on 10/19/2017 09/27/16   Charlestine NightLawyer, Christopher, [name]  benzonatate (TESSALON) 200 MG capsule Take 1 capsule (200 mg total) by mouth 3 (three) times daily as needed for cough. Patient not taking: Reported on 08/25/2018 02/10/18   Palma HolterGunadasa, Kanishka G, MD  cetirizine (ZYRTEC) 10 MG tablet Take 1 tablet (10 mg total) by mouth daily. Patient not taking: Reported on 08/25/2018 04/09/18   Berton BonMikell, Asiyah Zahra, MD  diclofenac (VOLTAREN) 75 MG EC tablet TAKE 1 TABLET BY MOUTH TWICE DAILY WITH FOOD Patient not taking: Reported on 08/25/2018 08/06/18   Jamelle RushingAnderson, Chelsey L, DO  esomeprazole (NEXIUM) 20 MG capsule Take 20 mg by mouth daily. Over the counter    [provider]  famotidine (PEPCID) 20 MG tablet Take 1 tablet (20 mg total) by mouth 2 (two) times daily. Patient not taking: Reported on 08/25/2018 04/09/18   Berton BonMikell, Asiyah Zahra, MD  Fe Fum-FePoly-FA-Vit C-Vit B3 (INTEGRA F) 125-1 MG CAPS Take 1 capsule by mouth daily. Patient not taking: Reported on 08/25/2018 04/29/17   Berton BonMikell, Asiyah Zahra, MD  fluticasone (FLONASE) 50 MCG/ACT nasal spray Place 2 sprays into both nostrils daily. Patient not taking: Reported on 08/25/2018 03/25/18   Berton Bon, MD  omeprazole (PRILOSEC) 20 MG capsule Take 1 capsule (20 mg total) by mouth daily. 10/26/18   Allayne Stack, DO  potassium chloride SA (K-DUR,KLOR-CON) 20 MEQ tablet Take 1 tablet (20 mEq total) 2 (two) times daily by mouth. Patient not taking: Reported on 08/25/2018 10/19/17   Roxy Horseman, [name]  sucralfate (CARAFATE) 1 g tablet Take 1 tablet (1 g total) by mouth 4 (four) times daily. 08/25/18    Carney Living, MD    Family History Family History  Problem Relation Age of Onset  . Diabetes Mother   . Hypertension Mother   . Obesity Mother   . Obesity Sister   . Osteochondroma Sister   . Diabetes Sister   . Hypertension Sister     Social History Social History   Tobacco Use  . Smoking status: Never Smoker  . Smokeless tobacco: Never Used  Substance Use Topics  . Alcohol use: No  . Drug use: No     Allergies   Morphine and related and Motrin [ibuprofen]   Review of Systems Review of Systems  Constitutional: Negative for fever.  HENT: Positive for sore throat. Negative for congestion.   Respiratory: Positive for cough (yesterday).      Physical Exam Updated Vital Signs Pulse 78   Temp (!) 97.5 F (36.4 C) (Oral)   Resp 18   LMP 11/08/2018 (Approximate)   SpO2 99%   Physical Exam Vitals signs and nursing note reviewed.  Constitutional:      General: She is not in acute distress.    Appearance: She is well-developed. She is not diaphoretic.  HENT:     Head: Normocephalic and atraumatic.     Right Ear: Tympanic membrane normal.     Left Ear: Tympanic membrane normal.     Mouth/Throat:     Mouth: Mucous membranes are moist.     Pharynx: No oropharyngeal exudate or uvula swelling (long, but no edema or erythema).     Tonsils: No tonsillar exudate or tonsillar abscesses. Swelling: 1+ on the right. 1+ on the left.  Eyes:     General: No scleral icterus.       Right eye: No discharge.        Left eye: No discharge.     Conjunctiva/sclera: Conjunctivae normal.     Pupils: Pupils are equal, round, and reactive to light.  Neck:     Musculoskeletal: Normal range of motion and neck supple.     Thyroid: No thyromegaly.  Cardiovascular:     Rate and Rhythm: Normal rate and regular rhythm.     Heart sounds: Normal heart sounds. No murmur. No friction rub. No gallop.   Pulmonary:     Effort: Pulmonary effort is normal. No respiratory distress.      Breath sounds: Normal breath sounds. No stridor. No wheezing or rales.  Abdominal:     General: Bowel sounds are normal. There is no distension.     Palpations: Abdomen is soft.     Tenderness: There is no abdominal tenderness. There is no guarding or rebound.  Lymphadenopathy:     Cervical: No cervical adenopathy.  Skin:    General: Skin is warm and dry.     Coloration: Skin is not pale.     Findings: No rash.  Neurological:     Mental Status: She is  alert.     Coordination: Coordination normal.      ED Treatments / Results  Labs (all labs ordered are listed, but only abnormal results are displayed) Labs Reviewed  GROUP A STREP BY PCR    EKG None  Radiology No results found.  Procedures Procedures (including critical care time)  Medications Ordered in ED Medications  dexamethasone (DECADRON) tablet 10 mg (has no administration in time range)     Initial Impression / Assessment and Plan / ED Course  I have reviewed the triage vital signs and the nursing notes.  Pertinent labs & imaging results that were available during my care of the patient were reviewed by me and considered in my medical decision making (see chart for details).     Pt with negative strep. Diagnosis of viral pharyngitis.  Patient is well-appearing and is afebrile.  No abx indicated at this time. Discharge with symptomatic tx. No evidence of dehydration. Pt is tolerating secretions. Presentation not concerning for peritonsillar abscess or spread of infection to deep spaces of the throat; patent airway. Specific return precautions discussed. Recommended PCP follow up.  Patient understands and agrees with plan.  Patient vital stable throughout ED course and discharged in satisfactory condition.     Final Clinical Impressions(s) / ED Diagnoses   Final diagnoses:  Sore throat    ED Discharge Orders    None       Emi HolesLaw, Menna Abeln M, [name] 11/27/18 1114    Arby BarrettePfeiffer, Marcy, MD 11/30/18  1355

## 2018-12-20 ENCOUNTER — Other Ambulatory Visit: Payer: Self-pay | Admitting: Family Medicine

## 2019-02-04 ENCOUNTER — Other Ambulatory Visit: Payer: Self-pay

## 2019-02-04 MED ORDER — OMEPRAZOLE 20 MG PO CPDR: 20.0000 mg | DELAYED_RELEASE_CAPSULE | Freq: Every day | ORAL | 0 refills | Status: DC

## 2019-02-22 ENCOUNTER — Other Ambulatory Visit: Payer: Self-pay

## 2019-02-22 ENCOUNTER — Emergency Department (HOSPITAL_COMMUNITY)
Admission: EM | Admit: 2019-02-22 | Discharge: 2019-02-22 | Disposition: A | Discharge status: Home / Self Care | Payer: Medicaid Other | Attending: Emergency Medicine | Admitting: Emergency Medicine

## 2019-02-22 ENCOUNTER — Encounter (HOSPITAL_COMMUNITY): Payer: Self-pay | Admitting: *Deleted

## 2019-02-22 DIAGNOSIS — Z79899 Other long term (current) drug therapy: Secondary | ICD-10-CM | POA: Insufficient documentation

## 2019-02-22 DIAGNOSIS — K0889 Other specified disorders of teeth and supporting structures: Secondary | ICD-10-CM | POA: Insufficient documentation

## 2019-02-22 MED ORDER — HYDROCODONE-ACETAMINOPHEN 5-325 MG PO TABS: 1.0000 | ORAL_TABLET | Freq: Four times a day (QID) | ORAL | 0 refills | Status: DC | PRN

## 2019-02-22 MED ORDER — ACETAMINOPHEN 500 MG PO TABS
1000.0000 mg | ORAL_TABLET | Freq: Once | ORAL | Status: AC
  Administered 2019-02-22: 1000 mg via ORAL
  Filled 2019-02-22: qty 2

## 2019-02-22 MED ORDER — AMOXICILLIN-POT CLAVULANATE 875-125 MG PO TABS
1.0000 | ORAL_TABLET | Freq: Once | ORAL | Status: AC
  Administered 2019-02-22: 1 via ORAL
  Filled 2019-02-22: qty 1

## 2019-02-22 MED ORDER — AMOXICILLIN-POT CLAVULANATE 875-125 MG PO TABS: 1.0000 | ORAL_TABLET | Freq: Two times a day (BID) | ORAL | 0 refills | Status: DC

## 2019-02-22 NOTE — ED Provider Notes (Signed)
[facility] EMERGENCY DEPARTMENT Provider Note   CSN: [id number] Arrival date & time: 02/22/19  1232    History   Chief Complaint No chief complaint on file.   HPI Veronica [name] is a 28 y.o. female hx of GERD, here presenting with congestion, dental pain.  States that she has been congested for the last 2 to 3 days.  Also has been having pain in the left lower jaw area.  She states that she has a wisdom tooth that has been bothering her but she did not have it removed here.  She states that she has some subjective trouble swallowing but able to swallow.  Denies any fevers or sick contacts and in particular did not have any exposure to anybody sick.     The history is provided by the patient.    Past Medical History:  Diagnosis Date  . Abdominal pain 01/06/2012  . Anemia   . Ankle pain   . GERD (gastroesophageal reflux disease)   . Hiatal hernia   . Obesity (BMI 30-39.9)     Patient Active Problem List   Diagnosis Date Noted  . Headache 11/15/2018  . Screening for malignant neoplasm of cervix 03/27/2018  . Secondary amenorrhea 03/25/2018  . Seasonal allergies 03/25/2018  . Birth control counseling 08/26/2011  . GERD (gastroesophageal reflux disease) 03/28/2011  . Obesity (BMI 30-39.9) 02/27/2011  . Anemia 02/27/2011    Past Surgical History:  Procedure Laterality Date  . CESAREAN SECTION  09/02/2010   For macrosomia, but son was 7 lb 12 oz  . TONSILLECTOMY AND ADENOIDECTOMY       OB History   No obstetric history on file.      Home Medications    Prior to Admission medications   Medication Sig Start Date End Date Taking? Authorizing Provider  acetaminophen (TYLENOL) 500 MG tablet Take 1 tablet (500 mg total) by mouth every 6 (six) hours as needed. Patient taking differently: Take 500 mg every 6 (six) hours as needed by mouth (for pain or headaches).  07/12/16   Dan Humphreys, MD  albuterol (PROVENTIL HFA;VENTOLIN HFA) 108 (90 Base)  MCG/ACT inhaler Inhale 2 puffs into the lungs every 4 (four) hours as needed for wheezing or shortness of breath (cough). Patient taking differently: Inhale 2 puffs every 4 (four) hours as needed into the lungs (coughing, shortness of breath, or wheezing).  10/09/17   Street, Ricketts, [name]  artificial tears (LACRILUBE) OINT ophthalmic ointment Place into the right eye every 4 (four) hours as needed for dry eyes. Patient not taking: Reported on 10/19/2017 09/27/16   Charlestine Night, [name]  benzonatate (TESSALON) 200 MG capsule Take 1 capsule (200 mg total) by mouth 3 (three) times daily as needed for cough. Patient not taking: Reported on 08/25/2018 02/10/18   Palma Holter, MD  cetirizine (ZYRTEC) 10 MG tablet Take 1 tablet (10 mg total) by mouth daily. Patient not taking: Reported on 08/25/2018 04/09/18   Berton Bon, MD  diclofenac (VOLTAREN) 75 MG EC tablet TAKE 1 TABLET BY MOUTH TWICE DAILY WITH FOOD Patient not taking: Reported on 08/25/2018 08/06/18   Jamelle Rushing L, DO  esomeprazole (NEXIUM) 20 MG capsule Take 20 mg by mouth daily. Over the counter    [provider]  famotidine (PEPCID) 20 MG tablet Take 1 tablet (20 mg total) by mouth 2 (two) times daily. Patient not taking: Reported on 08/25/2018 04/09/18   Berton Bon, MD  Fe Fum-FePoly-FA-Vit C-Vit B3 (INTEGRA  F) 125-1 MG CAPS Take 1 capsule by mouth daily. Patient not taking: Reported on 08/25/2018 04/29/17   Berton Bon, MD  fluticasone [facility]) 50 MCG/ACT nasal spray Place 2 sprays into both nostrils daily. Patient not taking: Reported on 08/25/2018 03/25/18   Berton Bon, MD  omeprazole (PRILOSEC) 20 MG capsule Take 1 capsule (20 mg total) by mouth daily. 02/04/19   Allayne Stack, DO  potassium chloride SA (K-DUR,KLOR-CON) 20 MEQ tablet Take 1 tablet (20 mEq total) 2 (two) times daily by mouth. Patient not taking: Reported on 08/25/2018 10/19/17   Roxy Horseman, [name]  sucralfate  (CARAFATE) 1 g tablet Take 1 tablet (1 g total) by mouth 4 (four) times daily. 08/25/18   Carney Living, MD    Family History Family History  Problem Relation Age of Onset  . Diabetes Mother   . Hypertension Mother   . Obesity Mother   . Obesity Sister   . Osteochondroma Sister   . Diabetes Sister   . Hypertension Sister     Social History Social History   Tobacco Use  . Smoking status: Never Smoker  . Smokeless tobacco: Never Used  Substance Use Topics  . Alcohol use: No  . Drug use: No     Allergies   Morphine and related and Motrin [ibuprofen]   Review of Systems Review of Systems  HENT: Positive for congestion and dental problem.   All other systems reviewed and are negative.    Physical Exam Updated Vital Signs BP 124/75 (BP Location: Right Arm)   Pulse 80   Temp 98 F (36.7 C) (Oral)   Resp 16   SpO2 100%   Physical Exam Vitals signs and nursing note reviewed.  Constitutional:      Appearance: Normal appearance.  HENT:     Head: Normocephalic.     Nose: Nose normal.     Mouth/Throat:     Comments: L lower molar came out in a angular position, some swelling in the gums but no obvious periapical abscess. There is small cavity L lower molar. Floor of mouth nontender. Uvula slightly enlarged but no obvious PTA  Eyes:     Extraocular Movements: Extraocular movements intact.     Pupils: Pupils are equal, round, and reactive to light.  Neck:     Musculoskeletal: Normal range of motion.     Comments: Mild L cervical LAD  Cardiovascular:     Rate and Rhythm: Normal rate.     Pulses: Normal pulses.  Pulmonary:     Effort: Pulmonary effort is normal.     Breath sounds: Normal breath sounds.  Abdominal:     General: Abdomen is flat.     Palpations: Abdomen is soft.  Musculoskeletal: Normal range of motion.  Skin:    General: Skin is warm.     Capillary Refill: Capillary refill takes less than 2 seconds.  Neurological:     General: No focal  deficit present.     Mental Status: She is alert and oriented to person, place, and time.  Psychiatric:        Mood and Affect: Mood normal.        Behavior: Behavior normal.      ED Treatments / Results  Labs (all labs ordered are listed, but only abnormal results are displayed) Labs Reviewed - No data to display  EKG None  Radiology No results found.  Procedures Procedures (including critical care time)  Medications Ordered in ED Medications  acetaminophen (TYLENOL) tablet 1,000 mg (has no administration in time range)  amoxicillin-clavulanate (AUGMENTIN) 875-125 MG per tablet 1 tablet (has no administration in time range)     Initial Impression / Assessment and Plan / ED Course  I have reviewed the triage vital signs and the nursing notes.  Pertinent labs & imaging results that were available during my care of the patient were reviewed by me and considered in my medical decision making (see chart for details).       Phillippa Straub Chubbuck is a 28 y.o. female here with L lower jaw pain. I think likely inflammation vs small gum infection around L lower molar. Uvula slightly enlarged but she has no obvious PTA, floor of mouth soft and nontender. I doubt ludwig's. Will dc home with augmentin, pain meds. Will refer to dentist.   Final Clinical Impressions(s) / ED Diagnoses   Final diagnoses:  None    ED Discharge Orders    None       Charlynne Pander, MD 02/22/19 [phone]

## 2019-02-22 NOTE — Discharge Instructions (Signed)
Take tylenol for pain.   Take augmentin as prescribed.   Take vicodin for severe pain. DO NOT drive with it   See dentist for follow up. You may need the molar or wisdom tooth removed   Return to ER if you have worse dental pain, congestion, fever, trouble swallowing

## 2019-02-22 NOTE — ED Triage Notes (Signed)
Pt in c/o L lower dental pain and c/o congestion, denies fever, denies sore throat, A&O x4

## 2019-02-28 ENCOUNTER — Telehealth: Payer: Self-pay | Admitting: Family Medicine

## 2019-02-28 NOTE — Telephone Encounter (Signed)
Called back patient to discuss her recent ER visit, diagnosed with small gum infection.  She was requesting an appointment to discuss prescribing additional pain medication (had hydrocodone). She already completed her antibiotic with improvement of swelling/erythema around her gums.    No fever, no associated stabbing pain.  Able to take p.o. appropriately.  Has not been able to establish with a dentist yet due to several office closures.  Recommended using ibuprofen and Tylenol together with maximum daily dosage discussed and ice pack on her cheek as needed.  Additionally, recommended to follow-up with a dentist as soon as possible if symptoms continue.  Instructed to call back in the next few days if pain continues to be persistent and severe, increased erythema and swelling, decreased ability for po, or associated persistent fevers.  Allayne Stack, DO

## 2019-02-28 NOTE — Telephone Encounter (Signed)
Patient went to ER for infection in mouth, and would like to discuss with doctor. Patient asked to be seen on 03/04/19. Call patient to discuss.

## 2019-03-16 ENCOUNTER — Other Ambulatory Visit: Payer: Self-pay

## 2019-03-16 MED ORDER — OMEPRAZOLE 20 MG PO CPDR: 20.0000 mg | DELAYED_RELEASE_CAPSULE | Freq: Every day | ORAL | 0 refills | Status: DC

## 2019-05-05 ENCOUNTER — Emergency Department (HOSPITAL_COMMUNITY): Payer: Self-pay

## 2019-05-05 ENCOUNTER — Encounter (HOSPITAL_COMMUNITY): Payer: Self-pay | Admitting: *Deleted

## 2019-05-05 ENCOUNTER — Emergency Department (HOSPITAL_COMMUNITY)
Admission: EM | Admit: 2019-05-05 | Discharge: 2019-05-05 | Disposition: A | Discharge status: Home / Self Care | Payer: Self-pay | Attending: Emergency Medicine | Admitting: Emergency Medicine

## 2019-05-05 DIAGNOSIS — Z79899 Other long term (current) drug therapy: Secondary | ICD-10-CM | POA: Insufficient documentation

## 2019-05-05 DIAGNOSIS — F419 Anxiety disorder, unspecified: Secondary | ICD-10-CM

## 2019-05-05 DIAGNOSIS — R0602 Shortness of breath: Secondary | ICD-10-CM | POA: Insufficient documentation

## 2019-05-05 DIAGNOSIS — R419 Unspecified symptoms and signs involving cognitive functions and awareness: Secondary | ICD-10-CM | POA: Insufficient documentation

## 2019-05-05 DIAGNOSIS — E876 Hypokalemia: Secondary | ICD-10-CM | POA: Insufficient documentation

## 2019-05-05 LAB — POC URINE PREG, ED: Preg Test, Ur: NEGATIVE

## 2019-05-05 LAB — CBC
HCT: 35.9 % — ABNORMAL LOW (ref 36.0–46.0)
Hemoglobin: 11.6 g/dL — ABNORMAL LOW (ref 12.0–15.0)
MCH: 27.1 pg (ref 26.0–34.0)
MCHC: 32.3 g/dL (ref 30.0–36.0)
MCV: 83.9 fL (ref 80.0–100.0)
Platelets: 342 10*3/uL (ref 150–400)
RBC: 4.28 MIL/uL (ref 3.87–5.11)
RDW: 14.2 % (ref 11.5–15.5)
WBC: 9.1 10*3/uL (ref 4.0–10.5)
nRBC: 0 % (ref 0.0–0.2)

## 2019-05-05 LAB — BASIC METABOLIC PANEL
Anion gap: 14 (ref 5–15)
BUN: 7 mg/dL (ref 6–20)
CO2: 19 mmol/L — ABNORMAL LOW (ref 22–32)
Calcium: 8.8 mg/dL — ABNORMAL LOW (ref 8.9–10.3)
Chloride: 104 mmol/L (ref 98–111)
Creatinine, Ser: 0.99 mg/dL (ref 0.44–1.00)
GFR calc Af Amer: >60 mL/min (ref >60)
GFR calc non Af Amer: >60 mL/min (ref >60)
Glucose, Bld: 134 mg/dL — ABNORMAL HIGH (ref 70–99)
Potassium: 2.9 mmol/L — ABNORMAL LOW (ref 3.5–5.1)
Sodium: 137 mmol/L (ref 135–145)

## 2019-05-05 LAB — TROPONIN I: Troponin I: <0.03 ng/mL (ref <0.03)

## 2019-05-05 LAB — D-DIMER, QUANTITATIVE: D-Dimer, Quant: 0.66 ug/mL-FEU — ABNORMAL HIGH (ref 0.00–0.50)

## 2019-05-05 MED ORDER — POTASSIUM CHLORIDE 10 MEQ/100ML IV SOLN
10.0000 meq | Freq: Once | INTRAVENOUS | Status: AC
  Administered 2019-05-05: 10 meq via INTRAVENOUS
  Filled 2019-05-05: qty 100

## 2019-05-05 MED ORDER — HYDROXYZINE HCL 25 MG PO TABS
25.0000 mg | ORAL_TABLET | Freq: Once | ORAL | Status: AC
  Administered 2019-05-05: 25 mg via ORAL
  Filled 2019-05-05: qty 1

## 2019-05-05 MED ORDER — SODIUM CHLORIDE 0.9 % IV BOLUS
1000.0000 mL | Freq: Once | INTRAVENOUS | Status: AC
  Administered 2019-05-05: 16:00:00 1000 mL via INTRAVENOUS

## 2019-05-05 MED ORDER — HYDROXYZINE HCL 25 MG PO TABS: 25.0000 mg | ORAL_TABLET | Freq: Four times a day (QID) | ORAL | 0 refills | Status: DC

## 2019-05-05 MED ORDER — IOHEXOL 350 MG/ML SOLN
100.0000 mL | Freq: Once | INTRAVENOUS | Status: AC | PRN
  Administered 2019-05-05: 19:00:00 100 mL via INTRAVENOUS

## 2019-05-05 MED ORDER — POTASSIUM CHLORIDE CRYS ER 20 MEQ PO TBCR
40.0000 meq | EXTENDED_RELEASE_TABLET | Freq: Once | ORAL | Status: AC
  Administered 2019-05-05: 40 meq via ORAL
  Filled 2019-05-05: qty 2

## 2019-05-05 NOTE — ED Triage Notes (Signed)
Pt in c/o shortness of breath, states she was sitting on her cough and stood up, had a sudden onset of shortness of breath, states she ran to her car to get an ibuprofen and became dizzy and fell, denies passing out or hitting head, pt feels anxious but denies history of anxiety attacks. States this all started before going to work. Pt speaking in full sentences, VSS.

## 2019-05-05 NOTE — Discharge Instructions (Signed)
Your work-up today was very reassuring, labs, EKG and imaging does not suggest an acute problem with your heart or lungs.  Your potassium was low today and you were given potassium replacement, you should have this rechecked within 1 week to ensure your potassium has returned to a normal level, you can also increase potassium intake in your diet with things like bananas.  I do think that anxiety may have contributed to your symptoms today you can use hydroxyzine as needed for anxiety please follow-up with your primary care doctor to discuss your emergency department visit today.  Return to the ED if you have worsening shortness of breath, chest pain feel as though you are going to pass out or any other new or concerning symptoms occur.

## 2019-05-05 NOTE — ED Provider Notes (Signed)
[facility] EMERGENCY DEPARTMENT Provider Note   CSN: [id number] Arrival date & time: 05/05/19  1510    History   Chief Complaint Chief Complaint  Patient presents with   Shortness of Breath    HPI Veronica [name] is a 28 y.o. female.     Veronica [name] is a 28 y.o. female with a history of obesity, GERD and hiatal hernia, who presents to the emergency department for sudden onset shortness of breath.  She reports that she was getting ready to leave for work when she had sudden onset shortness of breath, she felt like she was breathing very quickly and having difficulty controlling her breathing.  She reports this made her feel very panicked.  She denies any associated chest pain.  She did feel lightheaded but did not have any syncopal episodes.  She denies associated fevers, no cough.  No rhinorrhea or sore throat.  She does not have any associated abdominal pain, nausea vomiting or diarrhea.  Denies lower extremity swelling aside from some slight swelling in her feet after she has been on them all day at work this goes away with elevation.  She has no prior history of DVT or PE, is not currently on birth control, no recent travel or surgeries.  Denies smoking history or drug use.  No prior history of any heart problems.  She has not taken anything for her symptoms prior to arrival, no other aggravating or alleviating factors.  She denies history of anxiety and has not had similar episodes in the past.     Past Medical History:  Diagnosis Date   Abdominal pain 01/06/2012   Anemia    Ankle pain    GERD (gastroesophageal reflux disease)    Hiatal hernia    Obesity (BMI 30-39.9)     Patient Active Problem List   Diagnosis Date Noted   Headache 11/15/2018   Screening for malignant neoplasm of cervix 03/27/2018   Secondary amenorrhea 03/25/2018   Seasonal allergies 03/25/2018   Birth control counseling 08/26/2011   GERD (gastroesophageal reflux  disease) 03/28/2011   Obesity (BMI 30-39.9) 02/27/2011   Anemia 02/27/2011    Past Surgical History:  Procedure Laterality Date   CESAREAN SECTION  09/02/2010   For macrosomia, but son was 7 lb 12 oz   TONSILLECTOMY AND ADENOIDECTOMY       OB History   No obstetric history on file.      Home Medications    Prior to Admission medications   Medication Sig Start Date End Date Taking? Authorizing Provider  acetaminophen (TYLENOL) 500 MG tablet Take 1 tablet (500 mg total) by mouth every 6 (six) hours as needed. Patient taking differently: Take 500 mg every 6 (six) hours as needed by mouth (for pain or headaches).  07/12/16   Dan Humphreys, MD  albuterol (PROVENTIL HFA;VENTOLIN HFA) 108 (90 Base) MCG/ACT inhaler Inhale 2 puffs into the lungs every 4 (four) hours as needed for wheezing or shortness of breath (cough). Patient taking differently: Inhale 2 puffs every 4 (four) hours as needed into the lungs (coughing, shortness of breath, or wheezing).  10/09/17   Street, [name], [name]  amoxicillin-clavulanate (AUGMENTIN) 875-125 MG tablet Take 1 tablet by mouth 2 (two) times daily. One po bid x 7 days 02/22/19   Charlynne Pander, MD  artificial tears (LACRILUBE) OINT ophthalmic ointment Place into the right eye every 4 (four) hours as needed for dry eyes. Patient not taking: Reported on 10/19/2017 09/27/16  Lawyer, Cristal Deer, [name]  benzonatate (TESSALON) 200 MG capsule Take 1 capsule (200 mg total) by mouth 3 (three) times daily as needed for cough. Patient not taking: Reported on 08/25/2018 02/10/18   Palma Holter, MD  cetirizine (ZYRTEC) 10 MG tablet Take 1 tablet (10 mg total) by mouth daily. Patient not taking: Reported on 08/25/2018 04/09/18   Berton Bon, MD  diclofenac (VOLTAREN) 75 MG EC tablet TAKE 1 TABLET BY MOUTH TWICE DAILY WITH FOOD Patient not taking: Reported on 08/25/2018 08/06/18   Jamelle Rushing L, DO  esomeprazole (NEXIUM) 20 MG capsule Take 20 mg by  mouth daily. Over the counter    [provider]  famotidine (PEPCID) 20 MG tablet Take 1 tablet (20 mg total) by mouth 2 (two) times daily. Patient not taking: Reported on 08/25/2018 04/09/18   Berton Bon, MD  Fe Fum-FePoly-FA-Vit C-Vit B3 (INTEGRA F) 125-1 MG CAPS Take 1 capsule by mouth daily. Patient not taking: Reported on 08/25/2018 04/29/17   Berton Bon, MD  fluticasone [facility]) 50 MCG/ACT nasal spray Place 2 sprays into both nostrils daily. Patient not taking: Reported on 08/25/2018 03/25/18   Berton Bon, MD  HYDROcodone-acetaminophen (NORCO/VICODIN) 5-325 MG tablet Take 1 tablet by mouth every 6 (six) hours as needed. 02/22/19   Charlynne Pander, MD  hydrOXYzine (ATARAX/VISTARIL) 25 MG tablet Take 1 tablet (25 mg total) by mouth every 6 (six) hours. 05/05/19   Dartha Lodge, [name]  omeprazole (PRILOSEC) 20 MG capsule Take 1 capsule (20 mg total) by mouth daily. 03/16/19   Allayne Stack, DO  potassium chloride SA (K-DUR,KLOR-CON) 20 MEQ tablet Take 1 tablet (20 mEq total) 2 (two) times daily by mouth. Patient not taking: Reported on 08/25/2018 10/19/17   Roxy Horseman, [name]  sucralfate (CARAFATE) 1 g tablet Take 1 tablet (1 g total) by mouth 4 (four) times daily. 08/25/18   Carney Living, MD    Family History Family History  Problem Relation Age of Onset   Diabetes Mother    Hypertension Mother    Obesity Mother    Obesity Sister    Osteochondroma Sister    Diabetes Sister    Hypertension Sister     Social History Social History   Tobacco Use   Smoking status: Never Smoker   Smokeless tobacco: Never Used  Substance Use Topics   Alcohol use: No   Drug use: No     Allergies   Morphine and related and Motrin [ibuprofen]   Review of Systems Review of Systems  Constitutional: Negative for chills and fever.  HENT: Negative.   Eyes: Negative for visual disturbance.  Respiratory: Positive for chest tightness and  shortness of breath. Negative for cough and wheezing.   Cardiovascular: Negative for chest pain and leg swelling.  Gastrointestinal: Negative for abdominal pain, nausea and vomiting.  Genitourinary: Negative for dysuria.  Musculoskeletal: Negative for arthralgias and myalgias.  Skin: Negative for color change and rash.  Neurological: Positive for light-headedness. Negative for dizziness and syncope.  Psychiatric/Behavioral: The patient is nervous/anxious.      Physical Exam Updated Vital Signs BP (!) 142/83 (BP Location: Right Arm)    Pulse (!) 115    Temp 98.9 F (37.2 C) (Oral)    Resp (!) 24    Ht 5\' 2"  (1.575 m)    SpO2 100%    BMI 55.60 kg/m   Physical Exam Vitals signs and nursing note reviewed.  Constitutional:  General: She is not in acute distress.    Appearance: She is well-developed. She is obese. She is not diaphoretic.  HENT:     Head: Normocephalic and atraumatic.  Eyes:     General:        Right eye: No discharge.        Left eye: No discharge.     Pupils: Pupils are equal, round, and reactive to light.  Neck:     Musculoskeletal: Neck supple.  Cardiovascular:     Rate and Rhythm: Normal rate and regular rhythm.     Heart sounds: Normal heart sounds.  Pulmonary:     Effort: Pulmonary effort is normal. No respiratory distress.     Breath sounds: Normal breath sounds. No wheezing or rales.  Chest:     Chest wall: No tenderness.  Abdominal:     General: Bowel sounds are normal. There is no distension.     Palpations: Abdomen is soft. There is no mass.     Tenderness: There is no abdominal tenderness. There is no guarding.  Musculoskeletal:        General: No deformity.     Right lower leg: She exhibits no tenderness. No edema.     Left lower leg: She exhibits no tenderness. No edema.  Skin:    General: Skin is warm and dry.     Capillary Refill: Capillary refill takes less than 2 seconds.  Neurological:     Mental Status: She is alert.      Coordination: Coordination normal.     Comments: Speech is clear, able to follow commands Moves extremities without ataxia, coordination intact  Psychiatric:        Mood and Affect: Mood normal.        Behavior: Behavior normal.      ED Treatments / Results  Labs (all labs ordered are listed, but only abnormal results are displayed) Labs Reviewed  BASIC METABOLIC PANEL - Abnormal; Notable for the following components:      Result Value   Potassium 2.9 (*)    CO2 19 (*)    Glucose, Bld 134 (*)    Calcium 8.8 (*)    All other components within normal limits  D-DIMER, QUANTITATIVE (NOT AT [facility]) - Abnormal; Notable for the following components:   D-Dimer, Quant 0.66 (*)    All other components within normal limits  CBC - Abnormal; Notable for the following components:   Hemoglobin 11.6 (*)    HCT 35.9 (*)    All other components within normal limits  TROPONIN I  POC URINE PREG, ED    EKG EKG Interpretation  Date/Time:  Thursday May 05 2019 15:17:24 EDT Ventricular Rate:  130 PR Interval:  146 QRS Duration: 82 QT Interval:  306 QTC Calculation: 450 R Axis:   44 Text Interpretation:  Sinus tachycardia Otherwise normal ECG Since last tracing rate faster Confirmed by Jacalyn Lefevre [phone]) on 05/05/2019 4:54:02 PM   Radiology Dg Chest 2 View  Result Date: 05/05/2019 CLINICAL DATA:  Shortness of breath EXAM: CHEST - 2 VIEW COMPARISON:  11/16/2018 FINDINGS: The heart size and mediastinal contours are within normal limits. Both lungs are clear. The visualized skeletal structures are unremarkable. IMPRESSION: No acute abnormality of the lungs.  No focal airspace opacity. Electronically Signed   By: Lauralyn Primes M.D.   On: 05/05/2019 15:55   Ct Angio Chest Pe W And/or Wo Contrast  Result Date: 05/05/2019 CLINICAL DATA:  Cough and shortness of Breath  EXAM: CT ANGIOGRAPHY CHEST WITH CONTRAST TECHNIQUE: Multidetector CT imaging of the chest was performed using the standard  protocol during bolus administration of intravenous contrast. Multiplanar CT image reconstructions and MIPs were obtained to evaluate the vascular anatomy. CONTRAST:  OMNIPAQUE 350 COMPARISON:  05/05/2019 FINDINGS: Cardiovascular: Thoracic aorta and its branches are within normal limits. No cardiac enlargement is seen. Pulmonary artery shows no large central pulmonary embolism. No coronary calcifications are noted. Mediastinum/Nodes: Mild hiatal hernia is seen. No hilar or mediastinal adenopathy is noted. Thoracic inlet is within normal limits. Lungs/Pleura: Lungs are clear. No pleural effusion or pneumothorax. Upper Abdomen: No acute abnormality. Musculoskeletal: No acute bony abnormality is noted. No rib abnormality is seen. Review of the MIP images confirms the above findings. IMPRESSION: No evidence of pulmonary emboli.  No acute abnormality noted. Small sliding-type hiatal hernia. Electronically Signed   By: Alcide Clever M.D.   On: 05/05/2019 19:11    Procedures Procedures (including critical care time)  Medications Ordered in ED Medications  sodium chloride 0.9 % bolus 1,000 mL (1,000 mLs Intravenous New Bag/Given 05/05/19 1620)  hydrOXYzine (ATARAX/VISTARIL) tablet 25 mg (25 mg Oral Given 05/05/19 1621)  potassium chloride SA (K-DUR) CR tablet 40 mEq (40 mEq Oral Given 05/05/19 1805)  potassium chloride 10 mEq in 100 mL IVPB (10 mEq Intravenous New Bag/Given 05/05/19 1803)  iohexol (OMNIPAQUE) 350 MG/ML injection 100 mL (100 mLs Intravenous Contrast Given 05/05/19 1854)     Initial Impression / Assessment and Plan / ED Course  I have reviewed the triage vital signs and the nursing notes.  Pertinent labs & imaging results that were available during my care of the patient were reviewed by me and considered in my medical decision making (see chart for details).  Patient presents with sudden onset shortness of breath this afternoon when leaving for work.  She does endorse feeling anxious and  panicked but does not have a prior history of anxiety or panic attacks.  She denies associated chest pain but did have some lightheadedness, no syncope.  On arrival she is tachycardic and mildly tachypneic, all other vitals normal.  Denies fevers or infectious symptoms.  No prior cardiac history or history of DVT or PE.  Patient is not PERC negative due to tachycardia.  Will check EKG, basic labs, troponin, d-dimer and chest x-ray.  IV fluids as well as hydroxyzine given for symptomatic management, patient does appear very anxious on exam and I think this may be contributing.  Labs show no leukocytosis, stable hemoglobin, hypokalemia of 2.9 but no other acute electrolyte derangements, normal renal function.  Negative pregnancy.  Troponin is negative and EKG shows sinus rhythm without concerning changes.  Chest x-ray shows no acute cardiopulmonary disease, images reviewed by myself and I agree.  D-dimer was elevated at 0.66, discussed with patient will proceed with CT Angio of the chest to rule out PE, she does not have any focal lower extremity edema on exam.  CTA shows no evidence of PE.  Discussed reassuring results with patient.  Her symptoms have improved with treatment here in the emergency department and her tachycardia has resolved.  Patient given potassium replacement here in the ED.  She is to follow-up in 1 week for potassium recheck.  Will prescribe hydroxyzine for anxiety which I think likely contributed to her symptoms today.  Return precautions discussed.  Patient expresses understanding and agreement with plan.  Discharged home in good condition.  Final Clinical Impressions(s) / ED Diagnoses   Final  diagnoses:  SOB (shortness of breath)  Hypokalemia  Anxiety    ED Discharge Orders         Ordered    hydrOXYzine (ATARAX/VISTARIL) 25 MG tablet  Every 6 hours     05/05/19 2012           Dartha LodgeFord, Galadriel Shroff N, [place] JerseyPA-C 05/07/19 0103    Jacalyn LefevreHaviland, Julie, MD 05/16/19 1650

## 2019-05-09 ENCOUNTER — Ambulatory Visit (INDEPENDENT_AMBULATORY_CARE_PROVIDER_SITE_OTHER): Payer: Self-pay | Admitting: Family Medicine

## 2019-05-09 ENCOUNTER — Other Ambulatory Visit: Payer: Self-pay

## 2019-05-09 VITALS — BP 115/80 | HR 90

## 2019-05-09 DIAGNOSIS — F411 Generalized anxiety disorder: Secondary | ICD-10-CM

## 2019-05-09 DIAGNOSIS — R3589 Other polyuria: Secondary | ICD-10-CM | POA: Insufficient documentation

## 2019-05-09 DIAGNOSIS — R358 Other polyuria: Secondary | ICD-10-CM

## 2019-05-09 MED ORDER — FLUOXETINE HCL 20 MG PO TABS: ORAL_TABLET | ORAL | 0 refills | Status: DC

## 2019-05-09 NOTE — Assessment & Plan Note (Signed)
Uncertain etiology.  Obese with strong family history of DM.  Recent elevated glucose on ED work-up. - Screening with hemoglobin A1c - Plan to follow-up with PCP on scheduled appointment on 05/11/2019

## 2019-05-09 NOTE — Progress Notes (Signed)
   Subjective   Patient ID: Veronica [name]    DOB: [birth year], 28 y.o. female   MRN: [id number]  CC: "Anxiety"  HPI: Veronica [name] is a 28 y.o. female who presents to clinic today for the following:  Anxiety: Ms. Monaco is here today to discuss worsening feelings of anxiety.  She has been grieving recently since the passing of her mother back in 2017 which occurred around this time of the year.  She currently sees a behavioral therapist through hospice about once per week for the last month which she feels is helping.  She is interested in starting pharmacotherapy today.  She has no prior trials with other medications.  She denies feelings of depression, SI/HI.  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  She does have some feelings of palpitations during her feelings of anxiety which occur approximately twice a day since Thursday.  She was seen in the hospital during the initial onset with his tachycardia and EKG, but otherwise was discharged with hydroxyzine which she feels is not helping.  Polyuria: Patient reports increased urination over the last couple of days.  Her mother who passed from a heart attack back in 2017 also had heart disease along with diabetes.  She has no prior history of diabetes.  She had a glucose of 136 on a blood draw in the ED a few days ago.  She is obese.  ROS: see HPI for pertinent.  PMFSH: Reviewed. Smoking status reviewed. Medications reviewed.  Objective   BP 115/80   Pulse 90   SpO2 98%  Vitals and nursing note reviewed.  General: obese, well nourished, well developed, NAD with non-toxic appearance HEENT: normocephalic, atraumatic, moist mucous membranes Neck: supple, non-tender without lymphadenopathy Cardiovascular: regular rate and rhythm without murmurs, rubs, or gallops Lungs: clear to auscultation bilaterally with normal work of breathing Skin: warm,  dry, no rashes or lesions, cap refill < 2 seconds Extremities: warm and well perfused, normal tone, no edema Psych: euthymic mood, congruent affect Neuro: grossly intact throughout, non-tremulous  Assessment & Plan   Polyuria Uncertain etiology.  Obese with strong family history of DM.  Recent elevated glucose on ED work-up. - Screening with hemoglobin A1c - Plan to follow-up with PCP on scheduled appointment on 05/11/2019  GAD (generalized anxiety disorder) GAD-7 score 10, somewhat difficult.  No specific trigger other than anniversary of patient's mother's death.  Patient seems to be most concerned about having a spontaneous MI which killed her mother.  She does have risk factors.  Seems to be active with behavioral therapy and interested in pharmacotherapy.  Has support group including boyfriend and 50-year-old child.  Multiple motivations including a meaningful job. - Updated family history - Spent majority of interview discussing coping mechanisms - Initiating Prozac 20 mg daily for 2 weeks with plan to increase to 40 mg, discussed side effects - RTC 2 weeks or sooner if needed  Orders Placed This Encounter  Procedures  . Hemoglobin A1c   Meds ordered this encounter  Medications  . FLUoxetine (PROZAC) 20 MG tablet    Sig: TAKE 1 TAB (20 MG) DAILY FOR 2 WEEKS, THEN INCREASE TO 2 TABS (40 MG) DAILY.    Dispense:  90 tablet    Refill:  0    Durward Parcel, DO [facility], PGY-3 05/09/2019, 5:24 PM

## 2019-05-09 NOTE — Assessment & Plan Note (Signed)
GAD-7 score 10, somewhat difficult.  No specific trigger other than anniversary of patient's mother's death.  Patient seems to be most concerned about having a spontaneous MI which killed her mother.  She does have risk factors.  Seems to be active with behavioral therapy and interested in pharmacotherapy.  Has support group including boyfriend and 27-year-old child.  Multiple motivations including a meaningful job. - Updated family history - Spent majority of interview discussing coping mechanisms - Initiating Prozac 20 mg daily for 2 weeks with plan to increase to 40 mg, discussed side effects - RTC 2 weeks or sooner if needed

## 2019-05-09 NOTE — Patient Instructions (Addendum)
Thank you for coming in to see Veronica [name] today. Please see below to review our plan for today's visit.  I am pleased that you came in today.  Continue staying your therapist by phone.  Ultimately, it will take time for you to overcome her feelings of anxiety, but it is possible.  This will primarily be driven by your behavior and how you perceive each day.  We will trial you on a medication called fluoxetine (Prozac).  Take 1 tablet daily for 2 weeks, then increase to 2 tablets daily. We will have you follow-up with Dr. Annia Friendly in the next few days followed by another visit in approximately 2 weeks.  Please call the clinic at [phone] if your symptoms worsen or you have any concerns. It was our pleasure to serve you.  Durward Parcel, DO [facility], PGY-3

## 2019-05-10 ENCOUNTER — Encounter: Payer: Self-pay | Admitting: Family Medicine

## 2019-05-10 LAB — HEMOGLOBIN A1C
Est. average glucose Bld gHb Est-mCnc: 117 mg/dL
Hgb A1c MFr Bld: 5.7 % — ABNORMAL HIGH (ref 4.8–5.6)

## 2019-05-11 ENCOUNTER — Other Ambulatory Visit: Payer: Self-pay

## 2019-05-11 ENCOUNTER — Ambulatory Visit: Payer: Medicaid Other | Admitting: Family Medicine

## 2019-05-11 ENCOUNTER — Encounter: Payer: Self-pay | Admitting: Family Medicine

## 2019-05-11 ENCOUNTER — Ambulatory Visit (INDEPENDENT_AMBULATORY_CARE_PROVIDER_SITE_OTHER): Payer: Self-pay | Admitting: Family Medicine

## 2019-05-11 DIAGNOSIS — F411 Generalized anxiety disorder: Secondary | ICD-10-CM

## 2019-05-11 DIAGNOSIS — R7303 Prediabetes: Secondary | ICD-10-CM

## 2019-05-11 NOTE — Patient Instructions (Signed)
It was a wonderful seeing you today!  I 100% believing you and know that you can continue working a well-balanced diet and adding physical activity into your day.  This can include anything as walking a few times a day, playing games with [name], dancing etc.  I would like to see you back in about a month and a half so we can go over your progress.  Apps to consider:  -Calm or Breethe for anxiety - My fitness pal for monitoring calories, physical activity - You can also look at the pedometer usually on every smart phone to see how many steps are taking in a day.  A great goal would be to start around 7000 steps

## 2019-05-11 NOTE — Progress Notes (Signed)
   Subjective:    Patient ID: Veronica [name], female    DOB: 07/26/1991, 28 y.o.   MRN: [id number]   CC: prediabetes   HPI: [name] is a 28 year old female presenting discuss the following:  Prediabetes: Seen by Dr. Abelardo Diesel earlier this week for new onset anxiety, A1c obtained at that time.  Returned at 5.7, prediabetic range.  Additionally has a history of early CAD, mother passed away at age 57 due to MI, and morbid obesity (137 kg).  Says for the past week she is already realize she needs to undergo lifestyle changes.  She is working towards reducing the amount of sodas and increasing her daily water intake.  Also trying to decrease amount of fast food/snacks such as chips.  Does not exercise on a regular basis.  She would like to try doing this first prior to starting any medications.  Anxiety: Recently diagnosed, started on Prozac on 6/1.  States she is over thinking often but does not feel overly stressed.  Worries about her health and her mother's early MI.  Feels that she has good family and friend support.  Is already working towards calming breathing techniques.   Smoking status reviewed  Review of Systems Per HPI, also denies recent illness, fever, headache, changes in vision, abdominal pain, N/V/D, weakness   Patient Active Problem List   Diagnosis Date Noted  . Pre-diabetes 05/12/2019  . Polyuria 05/09/2019  . GAD (generalized anxiety disorder) 05/09/2019  . Headache 11/15/2018  . Secondary amenorrhea 03/25/2018  . Seasonal allergies 03/25/2018  . Birth control counseling 08/26/2011  . GERD (gastroesophageal reflux disease) 03/28/2011  . Obesity (BMI 30-39.9) 02/27/2011  . Anemia 02/27/2011     Objective:  BP 122/60  Vitals and nursing note reviewed  General: NAD, pleasant Cardiac: RRR Respiratory: CTAB, normal effort Abdomen: soft large abdomen, nontender, nondistended Extremities: no edema or cyanosis. WWP. Skin: warm and dry, no rashes noted Neuro:  alert and oriented, no focal deficits Psych: normal affect  Assessment & Plan:   Pre-diabetes Newly diagnosed, A1c 5.7.  Patient working towards lifestyle changes, congratulated her on already starting nutrition modification.  Discussed additional ways to add in physical activity into her routine.  Patient is motivated to make changes.  Will see her in follow-up in approximately 2 weeks for anxiety as below, will discuss goals at that time. - Will follow her closely for weight loss progress - Check lipid panel on F/U given associated family history of early CAD and obesity - Will continue to consider metformin on future visits  GAD (generalized anxiety disorder) Started on Prozac 20 mg on 6/1.  Spent majority of visit discussing relaxing techniques and  lifestyle modifications as discussed above. - Continue Prozac as prescribed - Continue coping mechanisms, recommended apps such as Calm or Breethe for meditation - F/U in approximately 2 weeks, sooner if needed   Leticia Penna, DO Family Medicine Resident PGY-1

## 2019-05-12 ENCOUNTER — Encounter: Payer: Self-pay | Admitting: Family Medicine

## 2019-05-12 DIAGNOSIS — R7303 Prediabetes: Secondary | ICD-10-CM | POA: Insufficient documentation

## 2019-05-12 NOTE — Assessment & Plan Note (Signed)
Newly diagnosed, A1c 5.7.  Patient working towards lifestyle changes, congratulated her on already starting nutrition modification.  Discussed additional ways to add in physical activity into her routine.  Patient is motivated to make changes.  Will see her in follow-up in approximately 2 weeks for anxiety as below, will discuss goals at that time. - Will follow her closely for weight loss progress - Check lipid panel on F/U given associated family history of early CAD and obesity - Will continue to consider metformin on future visits

## 2019-05-12 NOTE — Assessment & Plan Note (Signed)
Started on Prozac 20 mg on 6/1.  Spent majority of visit discussing relaxing techniques and  lifestyle modifications as discussed above. - Continue Prozac as prescribed - Continue coping mechanisms, recommended apps such as Calm or Breethe for meditation - F/U in approximately 2 weeks, sooner if needed

## 2019-05-17 ENCOUNTER — Telehealth (INDEPENDENT_AMBULATORY_CARE_PROVIDER_SITE_OTHER): Payer: Self-pay | Admitting: Family Medicine

## 2019-05-17 ENCOUNTER — Telehealth: Payer: Self-pay | Admitting: General Practice

## 2019-05-17 ENCOUNTER — Other Ambulatory Visit: Payer: Self-pay

## 2019-05-17 DIAGNOSIS — Z20822 Contact with and (suspected) exposure to covid-19: Secondary | ICD-10-CM

## 2019-05-17 DIAGNOSIS — R6889 Other general symptoms and signs: Secondary | ICD-10-CM

## 2019-05-17 NOTE — Telephone Encounter (Signed)
Pt has been scheduled for covid -19 testing.   Scheduled with pt directly at [facility].    Pt was referred by: Sherene Sires, DO

## 2019-05-17 NOTE — Patient Instructions (Signed)
Video visit

## 2019-05-17 NOTE — Assessment & Plan Note (Signed)
Only symptom is cough, but this is a new symptom for the last 2 days.  She was exposed 5 days ago to a patient that tested positive yesterday.  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  We went over isolation protocols while the test is pending.  Test ordered

## 2019-05-17 NOTE — Progress Notes (Signed)
Virtual Visit via Video Note  I connected with Veronica [name] on 05/17/19 at 10:50 AM EDT by a video enabled telemedicine application and verified that I am speaking with the correct person using two identifiers.  Location: Patient: home Provider: clinic   I discussed the limitations of evaluation and management by telemedicine and the availability of in person appointments. The patient expressed understanding and agreed to proceed.  History of Present Illness: Patient is a Dietitian, she saw the patient 5 days ago who yesterday tested positive for COVID.  Patient also describes that she has had a history of new cough whenever she inspires deeply starting 2 days ago.  She denies fevers, shortness of breath, sore throat, any other sick symptoms.  She says that her work is requiring a COVID test before she goes back to work.  She says that she thinks her cough might be related to being around smokers, but this is a new cough and she has been dating a smoker for quite some time.   Observations/Objective: Patient is speaking in full sentences with no shortness of breath during our exam.  She is thinking clearly and is in no distress.  Assessment and Plan: Definite possible COVID exposure, [place] symptoms reported starting 2 days ago.  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  She is been advised to self isolate and act as though she has coronavirus while she is waiting for her testing to take place.  We went over general isolation protocols in case this is positive.  She would need to be out for the longest of 3 criteria.  7 days since the beginning of symptoms which were Sunday.  3 days since last fever without Tylenol, she is not been describing fevers.  3 days since last respiratory symptom, she still has cough when inspiring deeply.  Coronavirus rapid test ordered, they will  call patient to arrange   Follow Up Instructions:    I discussed the assessment and treatment plan with the patient. The patient was provided an opportunity to ask questions and all were answered. The patient agreed with the plan and demonstrated an understanding of the instructions.   The patient was advised to call back or seek an in-person evaluation if the symptoms worsen or if the condition fails to improve as anticipated.  I provided 15 minutes of non-face-to-face time during this encounter.   Sherene Sires, DO

## 2019-05-17 NOTE — Addendum Note (Signed)
Addended by: Denman George on: 05/17/2019 11:31 AM   Modules accepted: Orders

## 2019-05-17 NOTE — Telephone Encounter (Signed)
-----   Message from Sherene Sires, DO sent at 05/17/2019 10:51 AM EDT ----- COVID Drive-Up Test Referral Criteria  Patient age: 28 y.o.  Symptoms: Cough  Underlying Conditions: Severe obesity and Diabetes  Is the patient a first responder? No  Does the patient live or work in a high risk or high density environment: Long-term care facility (including group homes)  Is the patient a COVID convalescent patient who is 14-28 days symptom-free and interested in donating plasma for use as a therapeutic product? 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  Needs test prior to returning to work

## 2019-05-18 ENCOUNTER — Other Ambulatory Visit: Payer: Medicaid Other

## 2019-05-18 ENCOUNTER — Emergency Department (HOSPITAL_COMMUNITY)
Admission: EM | Admit: 2019-05-18 | Discharge: 2019-05-18 | Disposition: A | Discharge status: Home / Self Care | Payer: Medicaid Other | Attending: Emergency Medicine | Admitting: Emergency Medicine

## 2019-05-18 ENCOUNTER — Other Ambulatory Visit: Payer: Self-pay

## 2019-05-18 DIAGNOSIS — Z20822 Contact with and (suspected) exposure to covid-19: Secondary | ICD-10-CM

## 2019-05-18 DIAGNOSIS — Z79899 Other long term (current) drug therapy: Secondary | ICD-10-CM | POA: Insufficient documentation

## 2019-05-18 DIAGNOSIS — F419 Anxiety disorder, unspecified: Secondary | ICD-10-CM | POA: Insufficient documentation

## 2019-05-18 DIAGNOSIS — U071 COVID-19: Secondary | ICD-10-CM | POA: Insufficient documentation

## 2019-05-18 LAB — TSH: TSH: 5.844 u[IU]/mL — ABNORMAL HIGH (ref 0.350–4.500)

## 2019-05-18 MED ORDER — LORAZEPAM 2 MG/ML IJ SOLN
1.0000 mg | Freq: Once | INTRAMUSCULAR | Status: AC
  Administered 2019-05-18: 1 mg via INTRAVENOUS
  Filled 2019-05-18: qty 1

## 2019-05-18 NOTE — Discharge Instructions (Signed)
Someone will contact you in the next day to let you know about your final thyroid test.

## 2019-05-18 NOTE — ED Notes (Signed)
Pt. States having anxiety. Pt. Taught visualization. Pt. Relaxed. Environment secured and lights dimmed.

## 2019-05-18 NOTE — ED Provider Notes (Signed)
TSH is elevated and T4 is pending however may not result for a while.  Speaking with patient she also started Prozac 2 weeks ago and she stated in the first week she felt better but this week she is feeling worse and concerned that may be adding into her anxiety and uneasiness.  Recommended she call her doctor to see if she should discontinue this medication.  Also will contact family medicine if T4 does not return so they can follow-up.  3:54 PM T4 still pending.  Spoke with Darrelyn Hillock family medicine resident about pending result for follow up tomorrow and further testing as needed.  Also discussed with her possible side effect of prozac.   Blanchie Dessert, MD 05/18/19 [phone]

## 2019-05-18 NOTE — ED Provider Notes (Signed)
[facility] EMERGENCY DEPARTMENT Provider Note   CSN: [id number] Arrival date & time: 05/18/19  0530    History   Chief Complaint Chief Complaint  Patient presents with  . Anxiety    HPI Veronica [name] is a 28 y.o. female.   The history is provided by the patient.  Anxiety   She has history of prediabetes, GERD and states that she has been having anxiety since about 1 AM.  She states that she woke up feeling anxious.  Did not actually have any chest pain.  Denies dyspnea, nausea, diaphoresis.  She has been having a lot of problems with anxiety over the last several weeks, and was seen in the ED for this about 2 weeks ago.  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  She has no symptoms suggestive of COVID-19.  She is a non-smoker and denies drug use.  Past Medical History:  Diagnosis Date  . Abdominal pain 01/06/2012  . Anemia   . Ankle pain   . GERD (gastroesophageal reflux disease)   . Hiatal hernia   . Obesity (BMI 30-39.9)     Patient Active Problem List   Diagnosis Date Noted  . Suspected Covid-19 Virus Infection 05/17/2019  . Pre-diabetes 05/12/2019  . Polyuria 05/09/2019  . GAD (generalized anxiety disorder) 05/09/2019  . Headache 11/15/2018  . Secondary amenorrhea 03/25/2018  . Seasonal allergies 03/25/2018  . Birth control counseling 08/26/2011  . GERD (gastroesophageal reflux disease) 03/28/2011  . Obesity (BMI 30-39.9) 02/27/2011  . Anemia 02/27/2011    Past Surgical History:  Procedure Laterality Date  . CESAREAN SECTION  09/02/2010   For macrosomia, but son was 7 lb 12 oz  . TONSILLECTOMY AND ADENOIDECTOMY       OB History   No obstetric history on file.      Home Medications    Prior to Admission medications   Medication Sig Start Date End Date Taking? Authorizing Provider  acetaminophen (TYLENOL) 500 MG tablet Take 1 tablet (500 mg total) by mouth every 6  (six) hours as needed. Patient not taking: Reported on 05/09/2019 07/12/16   Vira Blanco, MD  albuterol (PROVENTIL HFA;VENTOLIN HFA) 108 (90 Base) MCG/ACT inhaler Inhale 2 puffs into the lungs every 4 (four) hours as needed for wheezing or shortness of breath (cough). Patient not taking: Reported on 05/09/2019 10/09/17   Street, Killian, [name]  esomeprazole (NEXIUM) 20 MG capsule Take 20 mg by mouth daily. Over the counter    [provider]  famotidine (PEPCID) 20 MG tablet Take 1 tablet (20 mg total) by mouth 2 (two) times daily. Patient not taking: Reported on 08/25/2018 04/09/18   Tonette Bihari, MD  Fe Fum-FePoly-FA-Vit C-Vit B3 (INTEGRA F) 125-1 MG CAPS Take 1 capsule by mouth daily. Patient not taking: Reported on 08/25/2018 04/29/17   Tonette Bihari, MD  FLUoxetine (PROZAC) 20 MG tablet TAKE 1 TAB (20 MG) DAILY FOR 2 WEEKS, THEN INCREASE TO 2 TABS (40 MG) DAILY. 05/09/19   [place] Bing, DO  fluticasone (FLONASE) 50 MCG/ACT nasal spray Place 2 sprays into both nostrils daily. 03/25/18   Mikell, Jeani Sow, MD  potassium chloride SA (K-DUR,KLOR-CON) 20 MEQ tablet Take 1 tablet (20 mEq total) 2 (two) times daily by mouth. Patient not taking: Reported on 08/25/2018 10/19/17   Montine Circle, [name]    Family History Family History  Problem Relation Age of Onset  .  Diabetes Mother   . Hypertension Mother   . Obesity Mother   . Heart attack Mother [id number]       Death  . Obesity Sister   . Osteochondroma Sister   . Diabetes Sister   . Hypertension Sister     Social History Social History   Tobacco Use  . Smoking status: Never Smoker  . Smokeless tobacco: Never Used  Substance Use Topics  . Alcohol use: No  . Drug use: No     Allergies   Morphine and related and Motrin [ibuprofen]   Review of Systems Review of Systems  All other systems reviewed and are negative.    Physical Exam Updated Vital Signs BP (!) 168/96   Pulse (!) 106   Temp 98.9 F (37.2  C) (Oral)   Resp 18   Ht 5\' 2"  (1.575 m)   Wt 136.1 kg   SpO2 100%   BMI 54.87 kg/m   Physical Exam Vitals signs and nursing note reviewed.    28 year old female, resting comfortably and in no acute distress. Vital signs are significant for elevated blood pressure and heart rate. Oxygen saturation is 100%, which is normal. Head is normocephalic and atraumatic. PERRLA, EOMI. Oropharynx is clear. Neck is nontender and supple without adenopathy or JVD. Back is nontender and there is no CVA tenderness. Lungs are clear without rales, wheezes, or rhonchi. Chest is nontender. Heart has regular rate and rhythm with 2/6 Solik ejection murmur heard along the left sternal border.. Abdomen is soft, flat, nontender without masses or hepatosplenomegaly and peristalsis is normoactive. Extremities have no cyanosis or edema, full range of motion is present. Skin is warm and dry without rash. Neurologic: Mental status is normal, cranial nerves are intact, there are no motor or sensory deficits.  ED Treatments / Results  Labs (all labs ordered are listed, but only abnormal results are displayed) Labs Reviewed - No data to display  EKG EKG Interpretation  Date/Time:  Wednesday May 18 2019 05:57:58 EDT Ventricular Rate:  108 PR Interval:    QRS Duration: 90 QT Interval:  341 QTC Calculation: 457 R Axis:   51 Text Interpretation:  Sinus tachycardia Borderline T abnormalities, diffuse leads When compared with ECG of 05/05/2019, Nonspecific T wave abnormality is slightly more pronounced Confirmed by Dione BoozeGlick, Meliana Canner ([id number]) on 05/18/2019 6:20:45 AM   Radiology No results found.  Procedures Procedures   Medications Ordered in ED Medications - No data to display   Initial Impression / Assessment and Plan / ED Course  I have reviewed the triage vital signs and the nursing notes.  Pertinent labs & imaging results that were available during my care of the patient were reviewed by me and  considered in my medical decision making (see chart for details).  Anxiety.  ECG is significant only for sinus tachycardia. Old records are reviewed confirming ED visit May 28, at which time she also had a rapid heart rate.  D-dimer was elevated and she has had a CT angiogram which was negative.  Today, will give dose of lorazepam and also check thyroid studies.  Also, since she is supposed to get tested for coronavirus, will obtain specimen here. Case is signed out to Dr. Anitra LauthPlunkett.  Final Clinical Impressions(s) / ED Diagnoses   Final diagnoses:  Anxiety    ED Discharge Orders    None       Dione BoozeGlick, Abednego Yeates, MD 05/18/19 2257

## 2019-05-18 NOTE — ED Triage Notes (Signed)
Pt in with anxiety, states she is sob and has been anxious and unable to sleep well x past few wks. States she was started on Fluoxetine a few wks ago, feels no improvement of symptoms

## 2019-05-19 ENCOUNTER — Other Ambulatory Visit: Payer: Self-pay | Admitting: Family Medicine

## 2019-05-19 ENCOUNTER — Telehealth (INDEPENDENT_AMBULATORY_CARE_PROVIDER_SITE_OTHER): Payer: HRSA Program | Admitting: Family Medicine

## 2019-05-19 ENCOUNTER — Telehealth: Payer: Self-pay | Admitting: Family Medicine

## 2019-05-19 DIAGNOSIS — U071 COVID-19: Secondary | ICD-10-CM

## 2019-05-19 DIAGNOSIS — E038 Other specified hypothyroidism: Secondary | ICD-10-CM

## 2019-05-19 DIAGNOSIS — E039 Hypothyroidism, unspecified: Secondary | ICD-10-CM

## 2019-05-19 DIAGNOSIS — F411 Generalized anxiety disorder: Secondary | ICD-10-CM

## 2019-05-19 LAB — T4: T4, Total: 9.7 ug/dL (ref 4.5–12.0)

## 2019-05-19 NOTE — Telephone Encounter (Signed)
Wrote a Agricultural consultant stating that she is COVID positive and will need to remain out of work for [employer] time.  Will be put in the mail first thing tomorrow, 6/12.  Patriciaann Clan, DO

## 2019-05-19 NOTE — Assessment & Plan Note (Addendum)
TSH 5.8, T4 WNL at 9.7.  Results collected in the ED, discussed with the patient this morning.  No convincing hypothyroidism symptomatology. - Will observe for now, recheck in 6 months or sooner if symptomatic

## 2019-05-19 NOTE — Assessment & Plan Note (Signed)
Symptomatic for the past 5 days. Tested positive yesterday, 6/10, and discussed this positive result with her over the phone today.  Symptoms are likely contributory to why she feels her anxiety has increased within this past week.  While she endorses occasional shortness of breath after coughing or with increased anxiety, reassured during our entire conversation she was able to speak in full sentences without any labored breathing. - Remain at home, quarantine for 7-10 days since symptom onset, must be afebrile and symptom-free/respiratory improved for 2 to 3 days prior to return to work - Monitor son for symptoms, recommend keeping her son with his father for the current time, can choose to test if needed - Her live-in boyfriend should also be monitored for symptoms and quarantine as well, may choose to test, can discuss with his primary care provider - Strict precautions to present to the ED were discussed, especially including difficulty breathing - Encouraged symptomatic care, Tylenol, honey with tea, rest, staying well-hydrated - Sent certified letter to her home with COVID result and excusing her from work

## 2019-05-19 NOTE — Progress Notes (Signed)
[place] Telemedicine Visit  Patient consented to have virtual visit. Method of visit: Telephone  Encounter participants: Patient: Veronica [name] - located at Home  Provider: Patriciaann Clan - located at [facility]  Others (if applicable): None   Chief Complaint: Anxiety   HPI: Veronica [name] is a 28 year old female presented via telephone to discuss the following:  Anxiety/COVID positive: Started Prozac on 6/1, taking 20 mg.  After the first week she felt like her anxiety was doing better, however this past week she feels like her anxiety is increasing.  She has "kind of a burning sensation" going through her body and feels tired but has difficulty going to sleep. Feels drained. Denies any SI or HI or thoughts or harming herself/others.  Of note, believes she was recently exposed to someone with COVID at her work and started experiencing symptoms suggestive COVID on Sunday 6/7.  Went to the ED yesterday due to presumed panic attack, however COVID test performed there has returned positive.  TSH/T4 also completed, TSH elevated however T4 WNL.  She states she has been having nonproductive coughing, some shortness of breath (she says mainly when her anxiety feels like it is acting up, she can take a few deep breaths and it improves), and has minimal taste/smell.  She has felt warm, but does not have a thermometer to check her temperature.  Denies any sore throat, abdominal pain, N/V, changes in bowel movements, weakness.  Able to drink and eat normally.  She lives with her boyfriend and her son.  Son has been with his dad since Friday 6/5.   ROS: per HPI  Pertinent PMHx: Anxiety  Exam:  Respiratory: unlabored breathing, able to speak in full sentences without stopping  Assessment/Plan:  COVID-19 virus detected Symptomatic for the past 5 days. Tested positive yesterday, 6/10, and discussed this positive result with her over the phone today.  Symptoms are likely  contributory to why she feels her anxiety has increased within this past week.  While she endorses occasional shortness of breath after coughing or with increased anxiety, reassured during our entire conversation she was able to speak in full sentences without any labored breathing. - Remain at home, quarantine for 7-10 days since symptom onset, must be afebrile and symptom-free/respiratory improved for 2 to 3 days prior to return to work - Monitor son for symptoms, recommend keeping her son with his father for the current time, can choose to test if needed - Her live-in boyfriend should also be monitored for symptoms and quarantine as well, may choose to test, can discuss with his primary care provider - Strict precautions to present to the ED were discussed, especially including difficulty breathing - Encouraged symptomatic care, Tylenol, honey with tea, rest, staying well-hydrated - Sent certified letter to her home with COVID result and excusing her from work  GAD (generalized anxiety disorder) Uncontrolled, reports increased anxiety/restlessness in the past week, coincides with COVID symptoms, now on week 2 of Prozac.  While I believe some of her symptoms are likely related to COVID infection, could consider side effects of Prozac causing her to feel more restless this week.  Recommended decreasing dose down to 10 mg for a few days and increasing back to 20 if tolerating well.  Additionally went over relaxing techniques she is found helpful in the past. - Decrease Prozac to 10 mg, can increase to 20 if well tolerating - Continue relaxation techniques including mindful breathing, meditation, hobbies - Follow-up already scheduled on  6/19-believe patient may benefit from counseling, will discuss this further next week  Subclinical hypothyroidism TSH 5.8, T4 WNL at 9.7.  Results collected in the ED, discussed with the patient this morning.  No convincing hypothyroidism symptomatology. - Will observe  for now, recheck in 6 months or sooner if symptomatic    F/U already scheduled for 6/19-may need to transition to telemetry medicine pending patient's clinical status  Allayne StackSamantha N Iolani Twilley, DO  Time spent during visit with patient: 21 minutes

## 2019-05-19 NOTE — Telephone Encounter (Signed)
Pt is calling Dr. Higinio Plan back with her employers email. She states Dr. Higinio Plan was going to email her results of COVID testing.  [name]

## 2019-05-19 NOTE — Assessment & Plan Note (Addendum)
Uncontrolled, reports increased anxiety/restlessness in the past week, coincides with COVID symptoms, now on week 2 of Prozac.  While I believe some of her symptoms are likely related to COVID infection, could consider side effects of Prozac causing her to feel more restless this week.  Recommended decreasing dose down to 10 mg for a few days and increasing back to 20 if tolerating well.  Additionally went over relaxing techniques she is found helpful in the past. - Decrease Prozac to 10 mg, can increase to 20 if well tolerating - Continue relaxation techniques including mindful breathing, meditation, hobbies - Follow-up already scheduled on 6/19-believe patient may benefit from counseling, will discuss this further next week

## 2019-05-20 ENCOUNTER — Telehealth (INDEPENDENT_AMBULATORY_CARE_PROVIDER_SITE_OTHER): Payer: Medicaid Other | Admitting: Family Medicine

## 2019-05-20 ENCOUNTER — Other Ambulatory Visit: Payer: Self-pay

## 2019-05-20 ENCOUNTER — Telehealth: Payer: Medicaid Other | Admitting: Family Medicine

## 2019-05-20 DIAGNOSIS — U071 COVID-19: Secondary | ICD-10-CM

## 2019-05-20 DIAGNOSIS — F411 Generalized anxiety disorder: Secondary | ICD-10-CM

## 2019-05-20 MED ORDER — TRAZODONE HCL 50 MG PO TABS: 25.0000 mg | ORAL_TABLET | Freq: Every evening | ORAL | 0 refills | Status: DC | PRN

## 2019-05-20 NOTE — Progress Notes (Signed)
[place] Telemedicine Visit  Patient consented to have virtual visit. Method of visit: Video  Encounter participants: Patient: Veronica [name] - located at home Provider: Lind Covert - located at office Others (if applicable): no  Chief Complaint: Cant sleep - Anxiety  HPI: Diagnosed as Covid several days ago.  Have significant fatigue and burning of arms legs.  Slight cough and shortness of breath.  No fever  Anxiety Insomnia Feels anxious about being at home and having Covid.  Trouble getting to sleep.  Taking prozac 1/2 tab daily.   No suicidal ideation or depressive symptoms  ROS: per HPI  Pertinent PMHx: GERD GAD  Exam:  Respiratory: normal speech Psych:  Cognition and judgment appear intact. Alert, communicative  and cooperative with normal attention span and concentration. No apparent delusions, illusions, hallucinations  Assessment/Plan:  GAD (generalized anxiety disorder) Worsened prominent insomnia.  Add trazadone 25-50 mg at night.  Discussed sleep cycle and when best to try to sleep.    COVID-19 virus detected Physically seems to be tolerating well without many respiratory symptoms.  Discussed that fatigue and the burning sensations are expected and that they will improve but will likely take at least 1-2 more weeks    Asked to make video appointment iwht PCP next week  Time spent during visit with patient: 12 minutes

## 2019-05-20 NOTE — Assessment & Plan Note (Signed)
Physically seems to be tolerating well without many respiratory symptoms.  Discussed that fatigue and the burning sensations are expected and that they will improve but will likely take at least 1-2 more weeks

## 2019-05-20 NOTE — Assessment & Plan Note (Signed)
Worsened prominent insomnia.  Add trazadone 25-50 mg at night.  Discussed sleep cycle and when best to try to sleep.

## 2019-05-21 LAB — NOVEL CORONAVIRUS, NAA (HOSP ORDER, SEND-OUT TO REF LAB; TAT 18-24 HRS): SARS-CoV-2, NAA: DETECTED — AB

## 2019-05-23 ENCOUNTER — Other Ambulatory Visit: Payer: Self-pay

## 2019-05-23 ENCOUNTER — Encounter (HOSPITAL_COMMUNITY): Payer: Self-pay

## 2019-05-23 ENCOUNTER — Emergency Department (HOSPITAL_COMMUNITY)
Admission: EM | Admit: 2019-05-23 | Discharge: 2019-05-23 | Disposition: A | Discharge status: Home / Self Care | Payer: Medicaid Other | Attending: Emergency Medicine | Admitting: Emergency Medicine

## 2019-05-23 DIAGNOSIS — G933 Postviral fatigue syndrome: Secondary | ICD-10-CM | POA: Insufficient documentation

## 2019-05-23 DIAGNOSIS — F419 Anxiety disorder, unspecified: Secondary | ICD-10-CM | POA: Insufficient documentation

## 2019-05-23 DIAGNOSIS — Z6841 Body Mass Index (BMI) 40.0 and over, adult: Secondary | ICD-10-CM | POA: Insufficient documentation

## 2019-05-23 DIAGNOSIS — U071 COVID-19: Secondary | ICD-10-CM

## 2019-05-23 DIAGNOSIS — E669 Obesity, unspecified: Secondary | ICD-10-CM | POA: Insufficient documentation

## 2019-05-23 DIAGNOSIS — R05 Cough: Secondary | ICD-10-CM | POA: Insufficient documentation

## 2019-05-23 DIAGNOSIS — R0602 Shortness of breath: Secondary | ICD-10-CM | POA: Insufficient documentation

## 2019-05-23 DIAGNOSIS — R112 Nausea with vomiting, unspecified: Secondary | ICD-10-CM | POA: Insufficient documentation

## 2019-05-23 MED ORDER — ONDANSETRON HCL 4 MG PO TABS: 4.0000 mg | ORAL_TABLET | Freq: Three times a day (TID) | ORAL | 0 refills | Status: DC | PRN

## 2019-05-23 MED ORDER — BENZONATATE 100 MG PO CAPS: 100.0000 mg | ORAL_CAPSULE | Freq: Three times a day (TID) | ORAL | 0 refills | Status: DC | PRN

## 2019-05-23 MED ORDER — ONDANSETRON 4 MG PO TBDP
4.0000 mg | ORAL_TABLET | Freq: Once | ORAL | Status: AC
  Administered 2019-05-23: 4 mg via ORAL
  Filled 2019-05-23: qty 1

## 2019-05-23 NOTE — ED Provider Notes (Signed)
[facility]   CSN: [id number] Arrival date & time: 05/23/19  1617    History   Chief Complaint Chief Complaint  Patient presents with  . covid  . Anxiety    HPI Veronica [name] is a 28 y.o. female with history of GERD, obesity, pre-diabetes, seasonal allergies presenting for evaluation of ongoing fatigue, intermittent mild shortness of breath, and cough for about 1 week.  She reports that she tested positive for COVID-19 a few days ago and states that she has a lot of anxiety surrounding the situation as a whole.  She has a nonproductive cough, has had a few episodes of posttussive emesis today but overall able to tolerate p.o. intake.  Denies abdominal pain, no fevers, no chest pain.  She reports generalized weakness and states that she has had decreased oral intake for the last week or so as well.  She is a non-smoker.  She has been taking Robitussin and Tylenol and other over-the-counter medications with some improvement in her symptoms.  Reports that she has been having some difficulty sleeping as a result of her diagnosis and chart review shows she had a telemedicine visit with her PCP 3 days ago who added trazodone to take at night which she has been taking.  It appears that at that telemedicine visit they also discussed that her fatigue is expected and will likely improve but will likely take at least 1-2 more weeks.     The history is provided by the patient.    Past Medical History:  Diagnosis Date  . Abdominal pain 01/06/2012  . Anemia   . Ankle pain   . GERD (gastroesophageal reflux disease)   . Hiatal hernia   . Obesity (BMI 30-39.9)     Patient Active Problem List   Diagnosis Date Noted  . COVID-19 virus detected 05/19/2019  . Subclinical hypothyroidism 05/19/2019  . Pre-diabetes 05/12/2019  . Polyuria 05/09/2019  . GAD (generalized anxiety disorder) 05/09/2019  . Headache 11/15/2018  . Secondary amenorrhea  03/25/2018  . Seasonal allergies 03/25/2018  . Birth control counseling 08/26/2011  . GERD (gastroesophageal reflux disease) 03/28/2011  . Obesity (BMI 30-39.9) 02/27/2011  . Anemia 02/27/2011    Past Surgical History:  Procedure Laterality Date  . CESAREAN SECTION  09/02/2010   For macrosomia, but son was 7 lb 12 oz  . TONSILLECTOMY AND ADENOIDECTOMY       OB History   No obstetric history on file.      Home Medications    Prior to Admission medications   Medication Sig Start Date End Date Taking? Authorizing Provider  acetaminophen (TYLENOL) 500 MG tablet Take 1 tablet (500 mg total) by mouth every 6 (six) hours as needed. Patient not taking: Reported on 05/09/2019 07/12/16   Dan HumphreysIrick, Michael, MD  albuterol (PROVENTIL HFA;VENTOLIN HFA) 108 (90 Base) MCG/ACT inhaler Inhale 2 puffs into the lungs every 4 (four) hours as needed for wheezing or shortness of breath (cough). 10/09/17   Street, [name], [name]  benzonatate (TESSALON) 100 MG capsule Take 1 capsule (100 mg total) by mouth 3 (three) times daily as needed for cough. 05/23/19   Madisson Kulaga A, [name]  esomeprazole (NEXIUM) 20 MG capsule Take 20 mg by mouth daily as needed (acid reflux). Over the counter    [provider]  famotidine (PEPCID) 20 MG tablet Take 1 tablet (20 mg total) by mouth 2 (two) times daily. Patient not taking: Reported on 08/25/2018 04/09/18  Mikell, Antionette PolesAsiyah Zahra, MD  Fe Fum-FePoly-FA-Vit C-Vit B3 (INTEGRA F) 125-1 MG CAPS Take 1 capsule by mouth daily. Patient not taking: Reported on 08/25/2018 04/29/17   Berton BonMikell, Asiyah Zahra, MD  FLUoxetine (PROZAC) 20 MG tablet TAKE 1 TAB (20 MG) DAILY FOR 2 WEEKS, THEN INCREASE TO 2 TABS (40 MG) DAILY. Patient taking differently: Take 40 mg by mouth daily.  05/09/19   Wendee BeaversMcMullen, David J, DO  fluticasone (FLONASE) 50 MCG/ACT nasal spray Place 2 sprays into both nostrils daily. Patient taking differently: Place 2 sprays into both nostrils daily as needed for allergies.   03/25/18   Mikell, Antionette PolesAsiyah Zahra, MD  ondansetron (ZOFRAN) 4 MG tablet Take 1 tablet (4 mg total) by mouth every 8 (eight) hours as needed for nausea or vomiting. 05/23/19   Luevenia MaxinFawze, Leathie Weich A, [name]  potassium chloride SA (K-DUR,KLOR-CON) 20 MEQ tablet Take 1 tablet (20 mEq total) 2 (two) times daily by mouth. Patient not taking: Reported on 08/25/2018 10/19/17   Roxy HorsemanBrowning, Robert, [name]  Pseudoeph-Doxylamine-DM-APAP (NYQUIL PO) Take 30 mLs by mouth at bedtime as needed (sleep).    [provider]  traZODone (DESYREL) 50 MG tablet Take 0.5-1 tablets (25-50 mg total) by mouth at bedtime as needed for sleep. 05/20/19   Carney Livinghambliss, Marshall L, MD    Family History Family History  Problem Relation Age of Onset  . Diabetes Mother   . Hypertension Mother   . Obesity Mother   . Heart attack Mother [id number]       Death  . Obesity Sister   . Osteochondroma Sister   . Diabetes Sister   . Hypertension Sister     Social History Social History   Tobacco Use  . Smoking status: Never Smoker  . Smokeless tobacco: Never Used  Substance Use Topics  . Alcohol use: No  . Drug use: No     Allergies   Morphine and related and Motrin [ibuprofen]   Review of Systems Review of Systems  Constitutional: Positive for fatigue. Negative for chills and fever.  Respiratory: Positive for cough and shortness of breath.   Cardiovascular: Negative for chest pain and leg swelling.  Gastrointestinal: Positive for nausea and vomiting. Negative for abdominal pain.  Neurological: Positive for weakness (generalized).  All other systems reviewed and are negative.    Physical Exam Updated Vital Signs BP 136/80 (BP Location: Right Arm)   Pulse 80   Temp 98.9 F (37.2 C) (Oral)   Resp 17   Ht 5\' 2"  (1.575 m)   Wt 135.6 kg   LMP 04/28/2019   SpO2 100%   BMI 54.69 kg/m   Physical Exam Vitals signs and nursing note reviewed.  Constitutional:      General: She is not in acute distress.    Appearance: She is  well-developed.  HENT:     Head: Normocephalic and atraumatic.     Mouth/Throat:     Mouth: Mucous membranes are dry.  Eyes:     General:        Right eye: No discharge.        Left eye: No discharge.     Conjunctiva/sclera: Conjunctivae normal.  Neck:     Musculoskeletal: Normal range of motion and neck supple. No neck rigidity.     Vascular: No JVD.     Trachea: No tracheal deviation.  Cardiovascular:     Rate and Rhythm: Normal rate and regular rhythm.     Pulses: Normal pulses.     Heart sounds: Normal  heart sounds.  Pulmonary:     Effort: Pulmonary effort is normal. No respiratory distress.     Breath sounds: Normal breath sounds. No wheezing or rales.     Comments: SPO2 saturations 100% on room air, ambulates in the room with stable SPO2 saturations, speaking full sentences without difficulty. Abdominal:     General: Bowel sounds are normal. There is no distension.     Palpations: Abdomen is soft.     Tenderness: There is no abdominal tenderness. There is no guarding or rebound.  Lymphadenopathy:     Cervical: No cervical adenopathy.  Skin:    General: Skin is warm and dry.     Findings: No erythema.  Neurological:     Mental Status: She is alert.  Psychiatric:        Behavior: Behavior normal.      ED Treatments / Results  Labs (all labs ordered are listed, but only abnormal results are displayed) Labs Reviewed - No data to display  EKG None  Radiology No results found.  Procedures Procedures (including critical care time)  Medications Ordered in ED Medications  ondansetron (ZOFRAN-ODT) disintegrating tablet 4 mg (4 mg Oral Given 05/23/19 1717)     Initial Impression / Assessment and Plan / ED Course  I have reviewed the triage vital signs and the nursing notes.  Pertinent labs & imaging results that were available during my care of the patient were reviewed by me and considered in my medical decision making (see chart for details).         Veronica [name] was evaluated in Emergency Department on 05/23/2019 for the symptoms described in the history of present illness. She was evaluated in the context of the global COVID-19 pandemic, which necessitated consideration that the patient might be at risk for infection with the SARS-CoV-2 virus that causes COVID-19. Institutional protocols and algorithms that pertain to the evaluation of patients at risk for COVID-19 are in a state of rapid change based on information released by regulatory bodies including the CDC and federal and state organizations. These policies and algorithms were followed during the patient's care in the ED.  Patient is known COVID-19 positive presenting for generalized weakness/fatigue, cough, mild shortness of breath, and posttussive emesis.  She is afebrile, vital signs are stable.  She is nontoxic in appearance.  She is tolerating secretions without difficulty, ambulatory without difficulty and with stable SPO2 saturations.  Lungs are clear to auscultation bilaterally.  Tolerating p.o. in the ED.  Abdomen is soft and nontender, doubt acute surgical abdominal pathology.  We discussed that she would likely be symptomatic for a few weeks and they can take some time for her to start feeling better but that the management of COVID-19 is supportive.  She does have a PCP with whom she can follow-up.  She does appear mildly dehydrated but is tolerating p.o. intake in the ED. discussed symptomatic management.  Discussed quarantining at home.  Discussed follow-up with PCP and ED return precautions. Patient verbalized understanding of and agreement with plan and is safe for discharge home at this time.   Final Clinical Impressions(s) / ED Diagnoses   Final diagnoses:  COVID-19 virus infection  Anxiety    ED Discharge Orders         Ordered    ondansetron (ZOFRAN) 4 MG tablet  Every 8 hours PRN     05/23/19 1721    benzonatate (TESSALON) 100 MG capsule  3 times daily PRN  05/23/19 1721           Jeanie SewerFawze, Donjuan Robison A, [name] 05/23/19 1724    Sabas SousBero, Michael M, MD 05/24/19 [phone]

## 2019-05-23 NOTE — Discharge Instructions (Signed)
Plenty fluids and get plenty of rest.  Usually recommend 8 normal-size glasses of water a day or 2-3 water bottles a day.  Also make sure that you are eating regularly.  You can continue taking Tylenol as prescribed for pains and fevers.  Take Tessalon as needed for cough.  You can also continue taking other over-the-counter medications if they have been helpful.  Take Zofran as needed for nausea and vomiting.  Wait around 10 to 15 minutes before having anything to eat or drink to give this medication time to work.  Expect to feel your symptoms for a couple of weeks but they should steadily start to improve.  Continue to quarantine at home, do not leave your home during this time.  Make sure to wash your hands frequently and wipe down surfaces.  Return to the emergency department if any concerning signs or symptoms develop such as shortness of breath, passing out, high fevers, chest pains, or persistent vomiting.

## 2019-05-23 NOTE — ED Triage Notes (Signed)
Pt arrive c/o weakness and anxiety. Pt tested positive for covid 19 4 days ago and states that it has caused her anxiety. Pt has had decreased PO intake due to weakness. Endorses intermittent SOB

## 2019-05-24 ENCOUNTER — Telehealth: Payer: Self-pay | Admitting: Family Medicine

## 2019-05-24 ENCOUNTER — Encounter (HOSPITAL_COMMUNITY): Payer: Self-pay

## 2019-05-24 ENCOUNTER — Telehealth (INDEPENDENT_AMBULATORY_CARE_PROVIDER_SITE_OTHER): Payer: Self-pay | Admitting: Family Medicine

## 2019-05-24 ENCOUNTER — Emergency Department (HOSPITAL_COMMUNITY): Payer: Self-pay

## 2019-05-24 ENCOUNTER — Emergency Department (HOSPITAL_COMMUNITY)
Admission: EM | Admit: 2019-05-24 | Discharge: 2019-05-24 | Disposition: A | Discharge status: Home / Self Care | Payer: Self-pay | Attending: Emergency Medicine | Admitting: Emergency Medicine

## 2019-05-24 ENCOUNTER — Other Ambulatory Visit: Payer: Self-pay

## 2019-05-24 DIAGNOSIS — F411 Generalized anxiety disorder: Secondary | ICD-10-CM

## 2019-05-24 DIAGNOSIS — F419 Anxiety disorder, unspecified: Secondary | ICD-10-CM | POA: Insufficient documentation

## 2019-05-24 DIAGNOSIS — U071 COVID-19: Secondary | ICD-10-CM | POA: Insufficient documentation

## 2019-05-24 DIAGNOSIS — R509 Fever, unspecified: Secondary | ICD-10-CM | POA: Insufficient documentation

## 2019-05-24 DIAGNOSIS — Z6841 Body Mass Index (BMI) 40.0 and over, adult: Secondary | ICD-10-CM | POA: Insufficient documentation

## 2019-05-24 DIAGNOSIS — E669 Obesity, unspecified: Secondary | ICD-10-CM | POA: Insufficient documentation

## 2019-05-24 DIAGNOSIS — R05 Cough: Secondary | ICD-10-CM | POA: Insufficient documentation

## 2019-05-24 MED ORDER — HYDROXYZINE HCL 10 MG PO TABS: 10.0000 mg | ORAL_TABLET | Freq: Four times a day (QID) | ORAL | 0 refills | Status: DC | PRN

## 2019-05-24 MED ORDER — HYDROXYZINE HCL 10 MG PO TABS
10.0000 mg | ORAL_TABLET | Freq: Once | ORAL | Status: AC
  Administered 2019-05-24: 10 mg via ORAL
  Filled 2019-05-24: qty 1

## 2019-05-24 MED ORDER — ESCITALOPRAM OXALATE 10 MG PO TABS: 10.0000 mg | ORAL_TABLET | Freq: Every day | ORAL | 1 refills | Status: DC

## 2019-05-24 NOTE — Progress Notes (Signed)
[place] Telemedicine Visit  Patient consented to have virtual visit. Method of visit: Video  Encounter participants: Patient: Veronica [name] - located at home Provider: Kathrene Alu - located at [facility] Others (if applicable): none  Chief Complaint: Anxiety  HPI:  Patient reports that she has been feeling anxious for a long time but has especially felt anxious since she found out that she was covered positive, which was on last Wednesday, June 10.  She says that she will frequently get chills, which makes her feel anxious.  She notes that she will have intermittent shortness of breath, which she attributes to anxiety.  She was previously taking Prozac, but this made her feel worse, so she stopped taking it a while ago.  She went to the emergency department today for this issue and was given Atarax.  She took 1 tablet of Atarax and did not feel much improvement.  She is amenable to starting another SSRI today and to continue taking Atarax as needed.  ROS: per HPI  Pertinent PMHx: anxiety, depression, COVID-19 positive  Exam:  Respiratory: Patient speaks in full sentences without shortness of breath, no coughing during exam  Assessment/Plan:  GAD (generalized anxiety disorder) We will start Lexapro 10 mg once per day to help control patient's baseline anxiety.  Counseled patient that it is okay if she continues to take Atarax as needed every 6 hours for her anxiety.  Advised patient that it may take several weeks for Lexapro to reach its full effect, so she should continue to take it even if she does not feel any change at the outset.  I would like for her to call me in a few days to let me know how she is feeling, and we can always prescribe BuSpar at that time if she is amenable or prescribe more Atarax for this acute exacerbation.  I gave her lifestyle modifications for stress relief, including listening to music, walking, talking to friends and family over  the phone.  To control her chills from COVID, I advised her to take Advil and Tylenol every 3 hours alternating as needed.  She appears to be stable from a respiratory status and had normal vital signs in the emergency department today, which is reassuring.     Time spent during visit with patient: 16 minutes

## 2019-05-24 NOTE — ED Provider Notes (Signed)
[facility]   CSN: [id number] Arrival date & time: 05/24/19  [id number]     History   Chief Complaint Chief Complaint  Patient presents with  . Anxiety    HPI Veronica [name] Fray is a 28 y.o. female with a history of obesity, GERD, anemia, prediabetes, and generalized anxiety disorder who presents to the emergency department with an episode of anxiety related to her recent diagnosis of COVID-19.  Patient was diagnosed with COVID-19 five days prior, she has had symptoms including dry cough, fever, chills, fatigue, and generalized body aches.  She notes that she was having some episodes of emesis yesterday, but these have completely resolved and she is able to tolerate p.o.  She states that her diagnosis has made her feel quite anxious with intermittent anxiety attacks, she states that when she gets chills she becomes anxious and the anxiety leads to shortness of breath, she otherwise is denying any dyspnea to me.  She had an episode of increased anxiety which prompted ER visit today.  She remains with persistent anxiety in the emergency department.  She took Tylenol 45 minutes prior to arrival.  She was seen in the emergency department last evening for very similar symptoms, she was discharged home with prescriptions for Zofran to help with her nausea and vomiting which has been successful as well as Tessalon to help with cough.  Denies any vomiting today.  Denies chest pain, abdominal pain, melena, hematochezia, or leg swelling.  Additionally denies SI, HI, or hallucinations.      HPI  Past Medical History:  Diagnosis Date  . Abdominal pain 01/06/2012  . Anemia   . Ankle pain   . GERD (gastroesophageal reflux disease)   . Hiatal hernia   . Obesity (BMI 30-39.9)     Patient Active Problem List   Diagnosis Date Noted  . COVID-19 virus detected 05/19/2019  . Subclinical hypothyroidism 05/19/2019  . Pre-diabetes 05/12/2019  . Polyuria  05/09/2019  . GAD (generalized anxiety disorder) 05/09/2019  . Headache 11/15/2018  . Secondary amenorrhea 03/25/2018  . Seasonal allergies 03/25/2018  . Birth control counseling 08/26/2011  . GERD (gastroesophageal reflux disease) 03/28/2011  . Obesity (BMI 30-39.9) 02/27/2011  . Anemia 02/27/2011    Past Surgical History:  Procedure Laterality Date  . CESAREAN SECTION  09/02/2010   For macrosomia, but son was 7 lb 12 oz  . TONSILLECTOMY AND ADENOIDECTOMY       OB History   No obstetric history on file.      Home Medications    Prior to Admission medications   Medication Sig Start Date End Date Taking? Authorizing Provider  acetaminophen (TYLENOL) 500 MG tablet Take 1 tablet (500 mg total) by mouth every 6 (six) hours as needed. Patient not taking: Reported on 05/09/2019 07/12/16   Dan HumphreysIrick, Michael, MD  albuterol (PROVENTIL HFA;VENTOLIN HFA) 108 (90 Base) MCG/ACT inhaler Inhale 2 puffs into the lungs every 4 (four) hours as needed for wheezing or shortness of breath (cough). 10/09/17   Street, [name], [name]  benzonatate (TESSALON) 100 MG capsule Take 1 capsule (100 mg total) by mouth 3 (three) times daily as needed for cough. 05/23/19   Fawze, Mina A, [name]  esomeprazole (NEXIUM) 20 MG capsule Take 20 mg by mouth daily as needed (acid reflux). Over the counter    [provider]  famotidine (PEPCID) 20 MG tablet Take 1 tablet (20 mg total) by mouth 2 (two) times daily. Patient not taking: Reported  on 08/25/2018 04/09/18   Berton BonMikell, Asiyah Zahra, MD  Fe Fum-FePoly-FA-Vit C-Vit B3 (INTEGRA F) 125-1 MG CAPS Take 1 capsule by mouth daily. Patient not taking: Reported on 08/25/2018 04/29/17   Berton BonMikell, Asiyah Zahra, MD  FLUoxetine (PROZAC) 20 MG tablet TAKE 1 TAB (20 MG) DAILY FOR 2 WEEKS, THEN INCREASE TO 2 TABS (40 MG) DAILY. Patient taking differently: Take 40 mg by mouth daily.  05/09/19   Wendee BeaversMcMullen, David J, DO  fluticasone (FLONASE) 50 MCG/ACT nasal spray Place 2 sprays into both  nostrils daily. Patient taking differently: Place 2 sprays into both nostrils daily as needed for allergies.  03/25/18   Mikell, Antionette PolesAsiyah Zahra, MD  ondansetron (ZOFRAN) 4 MG tablet Take 1 tablet (4 mg total) by mouth every 8 (eight) hours as needed for nausea or vomiting. 05/23/19   Luevenia MaxinFawze, Mina A, [name]  potassium chloride SA (K-DUR,KLOR-CON) 20 MEQ tablet Take 1 tablet (20 mEq total) 2 (two) times daily by mouth. Patient not taking: Reported on 08/25/2018 10/19/17   Roxy HorsemanBrowning, Robert, [name]  Pseudoeph-Doxylamine-DM-APAP (NYQUIL PO) Take 30 mLs by mouth at bedtime as needed (sleep).    [provider]  traZODone (DESYREL) 50 MG tablet Take 0.5-1 tablets (25-50 mg total) by mouth at bedtime as needed for sleep. 05/20/19   Carney Livinghambliss, Marshall L, MD    Family History Family History  Problem Relation Age of Onset  . Diabetes Mother   . Hypertension Mother   . Obesity Mother   . Heart attack Mother [id number]       Death  . Obesity Sister   . Osteochondroma Sister   . Diabetes Sister   . Hypertension Sister     Social History Social History   Tobacco Use  . Smoking status: Never Smoker  . Smokeless tobacco: Never Used  Substance Use Topics  . Alcohol use: No  . Drug use: No     Allergies   Morphine and related and Motrin [ibuprofen]   Review of Systems Review of Systems  Constitutional: Positive for chills, fatigue and fever.  HENT: Negative for congestion, ear pain and sore throat.   Respiratory: Positive for cough and shortness of breath. Negative for wheezing.   Cardiovascular: Negative for chest pain.  Gastrointestinal: Positive for vomiting (resolved at present). Negative for abdominal pain and blood in stool.  Musculoskeletal: Positive for myalgias.  Psychiatric/Behavioral: Negative for suicidal ideas. The patient is nervous/anxious.   All other systems reviewed and are negative.    Physical Exam Updated Vital Signs BP 130/68 (BP Location: Right Arm)   Pulse 78    Temp 100.3 F (37.9 C) (Oral)   Resp 16   Ht 5\' 2"  (1.575 m)   Wt 135.6 kg   LMP 04/28/2019   SpO2 100%   BMI 54.69 kg/m   Physical Exam Vitals signs and nursing note reviewed.  Constitutional:      General: She is not in acute distress.    Appearance: She is well-developed. She is not toxic-appearing.  HENT:     Head: Normocephalic and atraumatic.     Mouth/Throat:     Mouth: Mucous membranes are moist.  Eyes:     General:        Right eye: No discharge.        Left eye: No discharge.     Conjunctiva/sclera: Conjunctivae normal.  Neck:     Musculoskeletal: Neck supple.  Cardiovascular:     Rate and Rhythm: Normal rate and regular rhythm.  Pulmonary:  Effort: Pulmonary effort is normal. No respiratory distress.     Breath sounds: Normal breath sounds. No stridor. No wheezing, rhonchi or rales.  Abdominal:     General: There is no distension.     Palpations: Abdomen is soft.     Tenderness: There is no abdominal tenderness. There is no guarding or rebound.  Skin:    General: Skin is warm and dry.     Findings: No rash.  Neurological:     Mental Status: She is alert.     Comments: Clear speech.   Psychiatric:        Behavior: Behavior normal.        Thought Content: Thought content does not include homicidal or suicidal ideation.     Comments: Does not appear to be responding to internal stimuli.     ED Treatments / Results  Labs (all labs ordered are listed, but only abnormal results are displayed) Labs Reviewed - No data to display  EKG    Radiology No results found.  Procedures Procedures (including critical care time)  Medications Ordered in ED Medications  hydrOXYzine (ATARAX/VISTARIL) tablet 10 mg (has no administration in time range)     Initial Impression / Assessment and Plan / ED Course  I have reviewed the triage vital signs and the nursing notes.  Pertinent labs & imaging results that were available during my care of the patient were  reviewed by me and considered in my medical decision making (see chart for details).    Veronica [name] Albers was evaluated in Emergency Department on 05/24/2019 for the symptoms described in the history of present illness. He/she was evaluated in the context of the global COVID-19 pandemic, which necessitated consideration that the patient might be at risk for infection with the SARS-CoV-2 virus that causes COVID-19. Institutional protocols and algorithms that pertain to the evaluation of patients at risk for COVID-19 are in a state of rapid change based on information released by regulatory bodies including the CDC and federal and state organizations. These policies and algorithms were followed during the patient's care in the ED.  Patient returns to the emergency department with complaints of anxiety regarding her diagnosis of COVID-19.  She reports anxiety is associated with shortness of breath, she is otherwise not dyspneic.  She is not expressing SI/HI/hallucinations, does not appear to have indication for consultation to TTS, would not anticipate inpatient psychiatric treatment being necessary.  She is nontoxic-appearing, in no apparent distress, her vitals are WNL.  On exam her lungs are clear to auscultation bilaterally, no respiratory distress, SPO2 100% on room air, I personally ambulated the patient in her exam room with SPO2 maintaining at 100%. Portable CXR obtained without findings of pneumonia, PTX, or fluid overload.  We discussed her diagnosis of COVID-19 at length.  She was given Atarax for her anxiety in the emergency department, will discharge with this to use as needed.  We will have her follow-up primary care. I discussed results, treatment plan, need for follow-up, and return precautions with the patient. Provided opportunity for questions, patient confirmed understanding and is in agreement with plan.   Final Clinical Impressions(s) / ED Diagnoses   Final diagnoses:  Anxiety    ED  Discharge Orders         Ordered    hydrOXYzine (ATARAX/VISTARIL) 10 MG tablet  Every 6 hours PRN     05/24/19 1148           Jatin Naumann, TajiqueSamantha R, [name] 05/24/19 1312  Carmin Muskrat, MD 05/24/19 [phone]

## 2019-05-24 NOTE — Discharge Instructions (Addendum)
You were seen in the emergency department today for anxiety.  Your chest x-ray was normal.  We provided you with a prescription for Atarax to take 1 to 2 tablets every 6 hours as needed for anxiety.  Please take this as prescribed.  We have prescribed you new medication(s) today. Discuss the medications prescribed today with your pharmacist as they can have adverse effects and interactions with your other medicines including over the counter and prescribed medications. Seek medical evaluation if you start to experience new or abnormal symptoms after taking one of these medicines, seek care immediately if you start to experience difficulty breathing, feeling of your throat closing, facial swelling, or rash as these could be indications of a more serious allergic reaction  Please see if a friend or family member to pick up this person action and drop it off to you to avoid going out in public with your known coronavirus. You have been diagnosed with covid 19. Please follow below precautions.   Take prior med as prescribed.   You need to quarantine for 14 days from onset of symptoms.  You may be able to discontinue self quarantine if the following conditions are met:   Persons with COVID-19 who have symptoms and were directed to care for themselves at home may discontinue home isolation under the  following conditions: - It has been at least 7 days have passed since symptoms first appeared. - AND at least 3 days (72 hours) have passed since recovery defined as resolution of fever without the use of fever-reducing medications and improvement in respiratory symptoms (e.g., cough, shortness of breath)  Please follow the below quarantine instructions.   Please follow up with primary care within 3-5 days for re-evaluation- call prior to going to the office to make them aware of your symptoms and your covid positive status. Return to the ER for new or worsening symptoms including but not limited to increased  work of breathing, fever, chest pain, passing out, or any other concerns.       Person Under Monitoring Name: Veronica [name]  Location: [place] [address]   Infection Prevention Recommendations for Individuals Confirmed to have, or Being Evaluated for, 2019 Novel Coronavirus (COVID-19) Infection Who Receive Care at Home  Individuals who are confirmed to have, or are being evaluated for, COVID-19 should follow the prevention steps below until a healthcare provider or local or state health department says they can return to normal activities.  Stay home except to get medical care You should restrict activities outside your home, except for getting medical care. Do not go to work, school, or public areas, and do not use public transportation or taxis.  Call ahead before visiting your doctor Before your medical appointment, call the healthcare provider and tell them that you have, or are being evaluated for, COVID-19 infection. This will help the healthcare providers office take steps to keep other people from getting infected. Ask your healthcare provider to call the local or state health department.  Monitor your symptoms Seek prompt medical attention if your illness is worsening (e.g., difficulty breathing). Before going to your medical appointment, call the healthcare provider and tell them that you have, or are being evaluated for, COVID-19 infection. Ask your healthcare provider to call the local or state health department.  Wear a facemask You should wear a facemask that covers your nose and mouth when you are in the same room with other people and when you visit a healthcare  provider. People who live with or visit you should also wear a facemask while they are in the same room with you.  Separate yourself from other people in your home As much as possible, you should stay in a different room from other people in your home. Also, you should use a  separate bathroom, if available.  Avoid sharing household items You should not share dishes, drinking glasses, cups, eating utensils, towels, bedding, or other items with other people in your home. After using these items, you should wash them thoroughly with soap and water.  Cover your coughs and sneezes Cover your mouth and nose with a tissue when you cough or sneeze, or you can cough or sneeze into your sleeve. Throw used tissues in a lined trash can, and immediately wash your hands with soap and water for at least 20 seconds or use an alcohol-based hand rub.  Wash your [facility] your hands often and thoroughly with soap and water for at least 20 seconds. You can use an alcohol-based hand sanitizer if soap and water are not available and if your hands are not visibly dirty. Avoid touching your eyes, nose, and mouth with unwashed hands.   Prevention Steps for Caregivers and Household Members of Individuals Confirmed to have, or Being Evaluated for, COVID-19 Infection Being Cared for in the Home  If you live with, or provide care at home for, a person confirmed to have, or being evaluated for, COVID-19 infection please follow these guidelines to prevent infection:  Follow healthcare providers instructions Make sure that you understand and can help the patient follow any healthcare provider instructions for all care.  Provide for the patients basic needs You should help the patient with basic needs in the home and provide support for getting groceries, prescriptions, and other personal needs.  Monitor the patients symptoms If they are getting sicker, call his or her medical provider and tell them that the patient has, or is being evaluated for, COVID-19 infection. This will help the healthcare providers office take steps to keep other people from getting infected. Ask the healthcare provider to call the local or state health department.  Limit the number of people who have  contact with the patient If possible, have only one caregiver for the patient. Other household members should stay in another home or place of residence. If this is not possible, they should stay in another room, or be separated from the patient as much as possible. Use a separate bathroom, if available. Restrict visitors who do not have an essential need to be in the home.  Keep older adults, very young children, and other sick people away from the patient Keep older adults, very young children, and those who have compromised immune systems or chronic health conditions away from the patient. This includes people with chronic heart, lung, or kidney conditions, diabetes, and cancer.  Ensure good ventilation Make sure that shared spaces in the home have good air flow, such as from an air conditioner or an opened window, weather permitting.  Wash your hands often Wash your hands often and thoroughly with soap and water for at least 20 seconds. You can use an alcohol based hand sanitizer if soap and water are not available and if your hands are not visibly dirty. Avoid touching your eyes, nose, and mouth with unwashed hands. Use disposable paper towels to dry your hands. If not available, use dedicated cloth towels and replace them when they become wet.  Wear a facemask  and gloves Wear a disposable facemask at all times in the room and gloves when you touch or have contact with the patients blood, body fluids, and/or secretions or excretions, such as sweat, saliva, sputum, nasal mucus, vomit, urine, or feces.  Ensure the mask fits over your nose and mouth tightly, and do not touch it during use. Throw out disposable facemasks and gloves after using them. Do not reuse. Wash your hands immediately after removing your facemask and gloves. If your personal clothing becomes contaminated, carefully remove clothing and launder. Wash your hands after handling contaminated clothing. Place all used  disposable facemasks, gloves, and other waste in a lined container before disposing them with other household waste. Remove gloves and wash your hands immediately after handling these items.  Do not share dishes, glasses, or other household items with the patient Avoid sharing household items. You should not share dishes, drinking glasses, cups, eating utensils, towels, bedding, or other items with a patient who is confirmed to have, or being evaluated for, COVID-19 infection. After the person uses these items, you should wash them thoroughly with soap and water.  Wash laundry thoroughly Immediately remove and wash clothes or bedding that have blood, body fluids, and/or secretions or excretions, such as sweat, saliva, sputum, nasal mucus, vomit, urine, or feces, on them. Wear gloves when handling laundry from the patient. Read and follow directions on labels of laundry or clothing items and detergent. In general, wash and dry with the warmest temperatures recommended on the label.  Clean all areas the individual has used often Clean all touchable surfaces, such as counters, tabletops, doorknobs, bathroom fixtures, toilets, phones, keyboards, tablets, and bedside tables, every day. Also, clean any surfaces that may have blood, body fluids, and/or secretions or excretions on them. Wear gloves when cleaning surfaces the patient has come in contact with. Use a diluted bleach solution (e.g., dilute bleach with 1 part bleach and 10 parts water) or a household disinfectant with a label that says EPA-registered for coronaviruses. To make a bleach solution at home, add 1 tablespoon of bleach to 1 quart (4 cups) of water. For a larger supply, add  cup of bleach to 1 gallon (16 cups) of water. Read labels of cleaning products and follow recommendations provided on product labels. Labels contain instructions for safe and effective use of the cleaning product including precautions you should take when applying  the product, such as wearing gloves or eye protection and making sure you have good ventilation during use of the product. Remove gloves and wash hands immediately after cleaning.  Monitor yourself for signs and symptoms of illness Caregivers and household members are considered close contacts, should monitor their health, and will be asked to limit movement outside of the home to the extent possible. Follow the monitoring steps for close contacts listed on the symptom monitoring form.   ? If you have additional questions, contact your local health department or call the epidemiologist on call at [phone] (available 24/7). ? This guidance is subject to change. For the most up-to-date guidance from [facility], please refer to their website: [email or web address]

## 2019-05-24 NOTE — ED Triage Notes (Signed)
Pt from home; c/o chills that has "triggering my anxiety"; temp 100.3; tested for positive covid 5 days ago; endorses cough and sob "here and there"; NAD at this time

## 2019-05-24 NOTE — Assessment & Plan Note (Addendum)
We will start Lexapro 10 mg once per day to help control patient's baseline anxiety.  Counseled patient that it is okay if she continues to take Atarax as needed every 6 hours for her anxiety.  Advised patient that it may take several weeks for Lexapro to reach its full effect, so she should continue to take it even if she does not feel any change at the outset.  I would like for her to call me in a few days to let me know how she is feeling, and we can always prescribe BuSpar at that time if she is amenable or prescribe more Atarax for this acute exacerbation.  I gave her lifestyle modifications for stress relief, including listening to music, walking, talking to friends and family over the phone.  To control her chills from COVID, I advised her to take Advil and Tylenol every 3 hours alternating as needed.  She appears to be stable from a respiratory status and had normal vital signs in the emergency department today, which is reassuring.

## 2019-05-24 NOTE — Progress Notes (Signed)
Left voicemail x3. Will mark patient as no show and not charge for video visit. Patient can call and reschedule for later in the day.  [name] MD PGY-2 Family Medicine Resident

## 2019-05-24 NOTE — ED Notes (Signed)
Pt. Stated, I tested positive for COVID and Im having anxiety and chills.

## 2019-05-24 NOTE — ED Notes (Signed)
Radiology at bedside for CXR

## 2019-05-25 ENCOUNTER — Telehealth: Payer: Self-pay | Admitting: Family Medicine

## 2019-05-25 ENCOUNTER — Other Ambulatory Visit: Payer: Self-pay

## 2019-05-25 ENCOUNTER — Emergency Department (HOSPITAL_COMMUNITY)
Admission: EM | Admit: 2019-05-25 | Discharge: 2019-05-25 | Disposition: A | Discharge status: Home / Self Care | Payer: Medicaid Other | Attending: Emergency Medicine | Admitting: Emergency Medicine

## 2019-05-25 ENCOUNTER — Encounter (HOSPITAL_COMMUNITY): Payer: Self-pay

## 2019-05-25 DIAGNOSIS — Z79899 Other long term (current) drug therapy: Secondary | ICD-10-CM | POA: Insufficient documentation

## 2019-05-25 DIAGNOSIS — E02 Subclinical iodine-deficiency hypothyroidism: Secondary | ICD-10-CM | POA: Insufficient documentation

## 2019-05-25 DIAGNOSIS — F419 Anxiety disorder, unspecified: Secondary | ICD-10-CM | POA: Insufficient documentation

## 2019-05-25 MED ORDER — HYDROXYZINE HCL 10 MG PO TABS
10.0000 mg | ORAL_TABLET | Freq: Once | ORAL | Status: AC
  Administered 2019-05-25: 08:00:00 10 mg via ORAL
  Filled 2019-05-25: qty 1

## 2019-05-25 MED ORDER — HYDROXYZINE HCL 25 MG PO TABS: 25.0000 mg | ORAL_TABLET | Freq: Four times a day (QID) | ORAL | 0 refills | Status: DC | PRN

## 2019-05-25 NOTE — Telephone Encounter (Signed)
Pt called to see if she could receive a phone call back from her PCP concerning her anxiety. Please give pt a call back.

## 2019-05-25 NOTE — ED Provider Notes (Signed)
[facility]-EMERGENCY DEPT Provider Note   CSN: [id number] Arrival date & time: 05/25/19  0709     History   Chief Complaint Chief Complaint  Patient presents with  . Anxiety    COVID positive    HPI Veronica [name] is a 28 y.o. female.      Anxiety    Pt was seen at 0730. Per pt, c/o gradual onset and persistence of acute flair of her chronic anxiety for the past 1 week. Pt states she tested positive for COVID on 05/18/19 which has significantly increased her chronic anxiety (COVID symptoms began 05/15/19). Pt has been evaluated by the ED and her PMD 6 times previously since 05/18/19 for her anxiety, most recently as of yesterday. Pt states she was rx new meds, including atarax, yesterday. Pt took one dose of atarax at 0500 PTA, then called EMS for her anxiety. Per EMS, pt apparently did not tell them she was +COVID. Pt currently denies any other complaints. Denies fever, no SOB/cough, no abd pain, no vomiting/diarrhea, no CP/palpitations, no rash.   Past Medical History:  Diagnosis Date  . Abdominal pain 01/06/2012  . Anemia   . Ankle pain   . GERD (gastroesophageal reflux disease)   . Hiatal hernia   . Obesity (BMI 30-39.9)     Patient Active Problem List   Diagnosis Date Noted  . COVID-19 virus detected 05/19/2019  . Subclinical hypothyroidism 05/19/2019  . Pre-diabetes 05/12/2019  . Polyuria 05/09/2019  . GAD (generalized anxiety disorder) 05/09/2019  . Headache 11/15/2018  . Secondary amenorrhea 03/25/2018  . Seasonal allergies 03/25/2018  . Birth control counseling 08/26/2011  . GERD (gastroesophageal reflux disease) 03/28/2011  . Obesity (BMI 30-39.9) 02/27/2011  . Anemia 02/27/2011    Past Surgical History:  Procedure Laterality Date  . CESAREAN SECTION  09/02/2010   For macrosomia, but son was 7 lb 12 oz  . TONSILLECTOMY AND ADENOIDECTOMY       OB History   No obstetric history on file.      Home Medications    Prior to  Admission medications   Medication Sig Start Date End Date Taking? Authorizing Provider  albuterol (PROVENTIL HFA;VENTOLIN HFA) 108 (90 Base) MCG/ACT inhaler Inhale 2 puffs into the lungs every 4 (four) hours as needed for wheezing or shortness of breath (cough). 10/09/17   Street, [name], [name]  benzonatate (TESSALON) 100 MG capsule Take 1 capsule (100 mg total) by mouth 3 (three) times daily as needed for cough. 05/23/19   Fawze, Mina A, [name]  escitalopram (LEXAPRO) 10 MG tablet Take 1 tablet (10 mg total) by mouth daily. 05/24/19   Lennox SoldersWinfrey, Amanda C, MD  esomeprazole (NEXIUM) 20 MG capsule Take 20 mg by mouth daily as needed (acid reflux). Over the counter    [provider]  FLUoxetine (PROZAC) 20 MG tablet TAKE 1 TAB (20 MG) DAILY FOR 2 WEEKS, THEN INCREASE TO 2 TABS (40 MG) DAILY. Patient taking differently: Take 40 mg by mouth daily.  05/09/19   Wendee BeaversMcMullen, David J, DO  fluticasone (FLONASE) 50 MCG/ACT nasal spray Place 2 sprays into both nostrils daily. Patient taking differently: Place 2 sprays into both nostrils daily as needed for allergies.  03/25/18   Mikell, Antionette PolesAsiyah Zahra, MD  hydrOXYzine (ATARAX/VISTARIL) 10 MG tablet Take 1-2 tablets (10-20 mg total) by mouth every 6 (six) hours as needed for anxiety. 05/24/19   Petrucelli, Samantha R, [name]  ondansetron (ZOFRAN) 4 MG tablet Take 1 tablet (4 mg total)  by mouth every 8 (eight) hours as needed for nausea or vomiting. 05/23/19   Michela PitcherFawze, Mina A, [name]  potassium chloride SA (K-DUR,KLOR-CON) 20 MEQ tablet Take 1 tablet (20 mEq total) 2 (two) times daily by mouth. Patient not taking: Reported on 08/25/2018 10/19/17   Roxy HorsemanBrowning, Robert, [name]  Pseudoeph-Doxylamine-DM-APAP (NYQUIL PO) Take 30 mLs by mouth at bedtime as needed (sleep).    [provider]  traZODone (DESYREL) 50 MG tablet Take 0.5-1 tablets (25-50 mg total) by mouth at bedtime as needed for sleep. 05/20/19   Carney Livinghambliss, Marshall L, MD  famotidine (PEPCID) 20 MG tablet Take 1  tablet (20 mg total) by mouth 2 (two) times daily. Patient not taking: Reported on 08/25/2018 04/09/18 05/24/19  Berton BonMikell, Asiyah Zahra, MD    Family History Family History  Problem Relation Age of Onset  . Diabetes Mother   . Hypertension Mother   . Obesity Mother   . Heart attack Mother [id number]       Death  . Obesity Sister   . Osteochondroma Sister   . Diabetes Sister   . Hypertension Sister     Social History Social History   Tobacco Use  . Smoking status: Never Smoker  . Smokeless tobacco: Never Used  Substance Use Topics  . Alcohol use: No  . Drug use: No     Allergies   Morphine and related and Motrin [ibuprofen]   Review of Systems Review of Systems ROS: Statement: All systems negative except as marked or noted in the HPI; Constitutional: Negative for fever and chills. ; ; Eyes: Negative for eye pain, redness and discharge. ; ; ENMT: Negative for ear pain, hoarseness, nasal congestion, sinus pressure and sore throat. ; ; Cardiovascular: Negative for chest pain, palpitations, diaphoresis, dyspnea and peripheral edema. ; ; Respiratory: Negative for cough, wheezing and stridor. ; ; Gastrointestinal: Negative for nausea, vomiting, diarrhea, abdominal pain, blood in stool, hematemesis, jaundice and rectal bleeding. . ; ; Genitourinary: Negative for dysuria, flank pain and hematuria. ; ; Musculoskeletal: Negative for back pain and neck pain. Negative for swelling and trauma.; ; Skin: Negative for pruritus, rash, abrasions, blisters, bruising and skin lesion.; ; Neuro: Negative for headache, lightheadedness and neck stiffness. Negative for weakness, altered level of consciousness, altered mental status, extremity weakness, paresthesias, involuntary movement, seizure and syncope.; Psych:  +anxiety. No SI, no SA, no HI, no hallucinations.       Physical Exam Updated Vital Signs BP (!) 161/98 (BP Location: Right Arm)   Pulse 95   Temp 99 F (37.2 C) (Oral)   Resp 14   Ht 5\' 2"   (1.575 m)   Wt 135.6 kg   LMP 04/28/2019   SpO2 98%   BMI 54.69 kg/m   Physical Exam 0735: Physical examination:  Nursing notes reviewed; Vital signs and O2 SAT reviewed;  Constitutional: Well developed, Well nourished, Well hydrated, In no acute distress; Head:  Normocephalic, atraumatic; Eyes: EOMI, PERRL, No scleral icterus; ENMT: Mouth and pharynx normal, Mucous membranes moist; Neck: Supple, Full range of motion; Cardiovascular: Regular rate and rhythm; Respiratory: Breath sounds clear, No wheezes.  Speaking full sentences with ease, Normal respiratory effort/excursion; Chest: No deformity, Movement normal; Abdomen: Nondistended; Extremities: No deformity.; Neuro: AA&Ox3, Major CN grossly intact.  Speech clear. No gross focal motor deficits in extremities. Climbs on and off stretcher easily by herself. Gait steady.; Skin: Color normal, Warm, Dry.; Psych:  Mildly anxious.    ED Treatments / Results  Labs (all labs ordered are  listed, but only abnormal results are displayed)   EKG EKG Interpretation  Date/Time:  Wednesday May 25 2019 07:33:27 EDT Ventricular Rate:  94 PR Interval:    QRS Duration: 91 QT Interval:  355 QTC Calculation: 444 R Axis:   30 Text Interpretation:  Sinus rhythm When compared with ECG of 05/18/2019 No significant change was found Confirmed by Francine Graven [id number]) on 05/25/2019 8:24:17 AM   Radiology   Procedures Procedures (including critical care time)  Medications Ordered in ED Medications  hydrOXYzine (ATARAX/VISTARIL) tablet 10 mg (has no administration in time range)     Initial Impression / Assessment and Plan / ED Course  I have reviewed the triage vital signs and the nursing notes.  Pertinent labs & imaging results that were available during my care of the patient were reviewed by me and considered in my medical decision making (see chart for details).     MDM Reviewed: previous chart, nursing note and vitals    0750:  Will  give another dose of atarax here, rx increased dose. Pt does not have O2 requirement, no SOB; no indication for medical admission. Pt denies SI/HI/AVH; no indication for inpt psych treatment at this time. Pt does state she has an outpt mental health provider. Pt strongly encourage to f/u with her PMD and mental health provider for good continuity of care and control of her chronic anxiety; pt verb understanding. Pt also given COVID precautions (mask wearing, isolation) by myself and ED RN. Will d/c stable.     Final Clinical Impressions(s) / ED Diagnoses   Final diagnoses:  None    ED Discharge Orders    None       Francine Graven, DO 05/30/19 6045

## 2019-05-25 NOTE — ED Triage Notes (Signed)
Pt BIBA from home. Pt woke up with an anxiety attack. Pt speaking in full sentences requesting oxygen, but NAD.   COVID POSITIVE. EMS was not informed of pt's COVID status.

## 2019-05-25 NOTE — ED Notes (Signed)
Bed: ND58 Expected date:  Expected time:  Means of arrival:  Comments: Ems 28 yo from home anxiety attack

## 2019-05-25 NOTE — ED Notes (Addendum)
Pt educated on importance of informing everyone she comes in contact with that she is covid positive. Pt is informed that she continues to expose people . Pt informed of importance of proper mask wearing. She was also educated about breathing techniques and calming music.

## 2019-05-25 NOTE — Discharge Instructions (Addendum)
Substance Abuse Treatment Programs ° °Intensive Outpatient Programs °[facility]     °[address]      °[place], [place]                   °[phone]      ° °[facility] °[address] °[place], [place] °[phone] ° °[facility]     °(Inpatient and outpatient)     °[address]           °[phone]   ° °[facility] °[phone] (Suboxone and Methadone) ° °[address]      °[place], [place] [address]      °[phone]      ° °[address] °[place], [place] °[phone] ° °[facility] (Outpatient/Inpatient, [facility])    °(insurance only) [phone]      °       °[facility] (Groups & Residential) °[place], [place] °[phone] ° °   °[facility]     °[address]     °[place], [place]      °[phone]      ° °[facility] (for caregivers and family) °[address] °[place], [place] °[phone] ° ° ° ° ° °Residential Treatment Programs °[facility]      °[address], [place], [place] [address]  °[phone]      ° °[facility] °[address] [place], [place] [address] °[phone] ° °[facility]        °[phone]      ° °[facility] °[phone] ° °[facility]             °[address]                                         °[place], [place]                                                °[phone] or [phone]                              ° °[facility] [place] °[address] °[place], [address] °[phone] ° °[facility]    °[address]      °, [place]     °[phone]      ° °[facility] °[address] °, [place] °[phone] ° °[facility]   °[address]     °[place], [place] [address]     °[phone]      °Admissions: 8am-3pm M-F ° °[facility] °[address] °[place],  [place] °[phone] ° °[facility]: Residential Program (90 Days)   °[place], [place]      °[phone] or [phone]    ° °[facility] °[place], [place] °(Walk in Hours over the weekend or by referral) ° °[facility] °[address], [place], [place] [address] °([phone] ° °Crisis Mobile: [facility]:  1-877-626-1772 (for crisis response 24 hours a day) °[facility] Hotline:      1-800-256-2452 °Outpatient Psychiatry and Counseling ° °[facility]: Mobile Crisis   Management 24 hours:  1-579-867-5796  [facility] of the [employer] sliding scale fee and walk in schedule: M-F 8am-12pm/1pm-3pm [address]  [place], [place] [address] [place], [place] [address] [phone]  [facility] ([facility])- new patient walk-in appointments available Monday - Friday 8am -3pm.          [address] [place], [place] [address] [phone] or crisis line- [facility]/ Intensive Outpatient Therapy Program [place], [place] [address] [place]      [phone] [place], [place] [address]                 [place]   [facility] [phone] [place], [place] [address]   [employer] of Care          [address] Johnette Abraham  [place], [place] [address]       [phone]  [facility] [address] [place], [place] [address] [phone]  [facility]    [address] [place], [place] [address]     [place], [place] Joycelyn Man     [place] [address]     [phone]       [facility] [place] [address]  [facility]     [address], [place], [place], [place] [address] [place]     [address]     [place], [place] [address]     [phone]       [facility] [address] [place], [place] [address] [phone] Resources for Temporary Residential Assistance/Crisis [facility]) M-F 8am-3pm   [address] [place], [place] [address]   [phone] Services include: laundry, barbering, support groups, case management, phone  & computer access, showers, AA/NA mtgs, mental health/substance abuse nurse, job skills class, disability information, VA assistance, spiritual classes, etc.   [facility]   [address], [place]     [place]              [employer] (women and children)       [place]. [place], [place] [address] [phone] [email or web address] for application and process Application Required  [facility]   [address]    [place] [address]     [phone]                    [place],  [address] [phone](OILNZVJK application appt.) Application Required  [facility] (women only)    [address]     [place],  [address]     [phone]  Intake starts 6pm daily Need valid ID, SSC, & Police report [employer] [address] [place], [place] [phone] Application Required  [employer] (men only)     [place].      [place], [place]       [place] (Pregnant women only) [address] [place], [place]  [facility]      [place] [address]      [place], [place] [address]     [phone]             [facility] [address] [place], [place] 90 day commitment/SA/Application process  [facility](men only)     [address]     [place], [place]       Check-in at [facility] [address] [place], [place] [address] [phone] Men/Women/Women and Children must be there by 7 pm  [place], [place]                  Take the new prescription as directed.  Call your regular medical doctor today to schedule a follow up appointment within the week.  Return to the Emergency Department immediately sooner if worsening.

## 2019-05-26 NOTE — Telephone Encounter (Signed)
Laguana R Yebra called to check in. She reports that she has not yet started the lexapro, as she thought that one was as needed. She reported starting the atarax and complained of tiredness, which is what she should expect from that medicaiton. After clarification on medications and indications, patient expressed understanding and would start the lexapro today. She has a follow up appt with Dr. Higinio Plan tomorrow and will discuss any further issues at that time.   Precautions for COVID provided. Self isolation and seeking care if developing any trouble breathing. Patient reports chills and cough are the same from last week. No worsening of symptoms.   Zettie Cooley, M.D.  Family Medicine  PGY-1 05/26/2019 4:11 PM

## 2019-05-27 ENCOUNTER — Emergency Department (HOSPITAL_COMMUNITY)
Admission: EM | Admit: 2019-05-27 | Discharge: 2019-05-27 | Disposition: A | Discharge status: Home / Self Care | Payer: Medicaid Other | Attending: Emergency Medicine | Admitting: Emergency Medicine

## 2019-05-27 ENCOUNTER — Telehealth (INDEPENDENT_AMBULATORY_CARE_PROVIDER_SITE_OTHER): Payer: HRSA Program | Admitting: Family Medicine

## 2019-05-27 ENCOUNTER — Other Ambulatory Visit: Payer: Self-pay

## 2019-05-27 ENCOUNTER — Emergency Department (HOSPITAL_COMMUNITY): Payer: Medicaid Other

## 2019-05-27 DIAGNOSIS — I10 Essential (primary) hypertension: Secondary | ICD-10-CM

## 2019-05-27 DIAGNOSIS — U071 COVID-19: Secondary | ICD-10-CM | POA: Insufficient documentation

## 2019-05-27 DIAGNOSIS — E876 Hypokalemia: Secondary | ICD-10-CM | POA: Insufficient documentation

## 2019-05-27 DIAGNOSIS — F411 Generalized anxiety disorder: Secondary | ICD-10-CM

## 2019-05-27 DIAGNOSIS — Z79899 Other long term (current) drug therapy: Secondary | ICD-10-CM | POA: Insufficient documentation

## 2019-05-27 DIAGNOSIS — R05 Cough: Secondary | ICD-10-CM | POA: Diagnosis not present

## 2019-05-27 DIAGNOSIS — R5383 Other fatigue: Secondary | ICD-10-CM

## 2019-05-27 LAB — COMPREHENSIVE METABOLIC PANEL
ALT: 47 U/L — ABNORMAL HIGH (ref 0–44)
AST: 21 U/L (ref 15–41)
Albumin: 3.7 g/dL (ref 3.5–5.0)
Alkaline Phosphatase: 57 U/L (ref 38–126)
Anion gap: 10 (ref 5–15)
BUN: <5 mg/dL — ABNORMAL LOW (ref 6–20)
CO2: 24 mmol/L (ref 22–32)
Calcium: 8.7 mg/dL — ABNORMAL LOW (ref 8.9–10.3)
Chloride: 100 mmol/L (ref 98–111)
Creatinine, Ser: 0.88 mg/dL (ref 0.44–1.00)
GFR calc Af Amer: >60 mL/min (ref >60)
GFR calc non Af Amer: >60 mL/min (ref >60)
Glucose, Bld: 101 mg/dL — ABNORMAL HIGH (ref 70–99)
Potassium: 3.1 mmol/L — ABNORMAL LOW (ref 3.5–5.1)
Sodium: 134 mmol/L — ABNORMAL LOW (ref 135–145)
Total Bilirubin: 0.9 mg/dL (ref 0.3–1.2)
Total Protein: 7.1 g/dL (ref 6.5–8.1)

## 2019-05-27 LAB — CBG MONITORING, ED: Glucose-Capillary: 95 mg/dL (ref 70–99)

## 2019-05-27 LAB — CBC WITH DIFFERENTIAL/PLATELET
Abs Immature Granulocytes: 0.02 10*3/uL (ref 0.00–0.07)
Basophils Absolute: 0 10*3/uL (ref 0.0–0.1)
Basophils Relative: 0 %
Eosinophils Absolute: 0.1 10*3/uL (ref 0.0–0.5)
Eosinophils Relative: 1 %
HCT: 36.1 % (ref 36.0–46.0)
Hemoglobin: 12 g/dL (ref 12.0–15.0)
Immature Granulocytes: 0 %
Lymphocytes Relative: 37 %
Lymphs Abs: 2.2 10*3/uL (ref 0.7–4.0)
MCH: 27.2 pg (ref 26.0–34.0)
MCHC: 33.2 g/dL (ref 30.0–36.0)
MCV: 81.9 fL (ref 80.0–100.0)
Monocytes Absolute: 0.6 10*3/uL (ref 0.1–1.0)
Monocytes Relative: 10 %
Neutro Abs: 3.1 10*3/uL (ref 1.7–7.7)
Neutrophils Relative %: 52 %
Platelets: 291 10*3/uL (ref 150–400)
RBC: 4.41 MIL/uL (ref 3.87–5.11)
RDW: 13.8 % (ref 11.5–15.5)
WBC: 6 10*3/uL (ref 4.0–10.5)
nRBC: 0 % (ref 0.0–0.2)

## 2019-05-27 LAB — URINALYSIS, ROUTINE W REFLEX MICROSCOPIC
Bacteria, UA: NONE SEEN
Bilirubin Urine: NEGATIVE
Glucose, UA: NEGATIVE mg/dL
Ketones, ur: NEGATIVE mg/dL
Leukocytes,Ua: NEGATIVE
Nitrite: NEGATIVE
Protein, ur: NEGATIVE mg/dL
Specific Gravity, Urine: 1.001 — ABNORMAL LOW (ref 1.005–1.030)
pH: 7 (ref 5.0–8.0)

## 2019-05-27 LAB — I-STAT BETA HCG BLOOD, ED (MC, WL, AP ONLY): I-stat hCG, quantitative: <5 m[IU]/mL (ref <5)

## 2019-05-27 MED ORDER — SODIUM CHLORIDE 0.9 % IV BOLUS
1000.0000 mL | Freq: Once | INTRAVENOUS | Status: AC
  Administered 2019-05-27: 1000 mL via INTRAVENOUS

## 2019-05-27 MED ORDER — POTASSIUM CHLORIDE CRYS ER 20 MEQ PO TBCR
40.0000 meq | EXTENDED_RELEASE_TABLET | Freq: Once | ORAL | Status: AC
  Administered 2019-05-27: 40 meq via ORAL
  Filled 2019-05-27: qty 2

## 2019-05-27 NOTE — Discharge Instructions (Signed)
Continue taking medications for your symptoms.  You may find it helpful to cut back on your hydroxyzine dosage as this is a be making you unnecessarily fatigued/drowsy.  Drink plenty of fluids and get plenty of rest.  Follow-up with your primary care provider for reevaluation of your symptoms and possible medication adjustments.  Return to the emergency department if any concerning signs or symptoms develop.  I have consulted our case managers to help set up home health services while you are experiencing your symptoms.  If your blood pressure (BP) was elevated on multiple readings during this visit above 130 for the top number or above 80 for the bottom number, please have this repeated by your primary care provider within one month. You can also check your blood pressure when you are out at a pharmacy or grocery store. Many have machines that will check your blood pressure.  If your blood pressure remains elevated, please follow-up with your PCP.

## 2019-05-27 NOTE — ED Triage Notes (Signed)
Pt presents with c/o fever and chills. Pt states she tested positive for the coronavirus x1 week ago. States she feels worse today. Pt in NAD.

## 2019-05-27 NOTE — Care Management (Signed)
ED CM consulted concerning arranging [facility] services. Patient is 28 y.o. female with history of GERD, obesity, hiatal hernia, prediabetes, seasonal allergies, anemia, thyroid disease presents for evaluation of ongoing and progressively worsening generalized weakness.  She has been seen multiple times by her PCP via telemedicine as well as physically in our ED in the last couple of weeks for the symptoms.  She tested positive for COVID-19 9 days ago.  CM noted patient to be without health insurance but is active with [facility] and has had a tele-health visit yesterday. EDP is requesting [facility] for supportive care. CM will follow up tomorrow with [facility] for  HR patients.

## 2019-05-27 NOTE — ED Notes (Signed)
Patient verbalizes understanding of discharge instructions. Opportunity for questioning and answers were provided. Armband removed by staff, pt discharged from ED.  

## 2019-05-27 NOTE — Assessment & Plan Note (Addendum)
Continues to be symptomatic with coughing and chills.  Breathing unlabored during evaluation today. Remains in isolation until asymptomatic/afebrile for 2-3 days. -Encouraged staying well-hydrated -May continue to use Tylenol 650 mg every 4-6 hours and/or ibuprofen 400 mg every 4-6 hours - Continue Tessalon Perles for cough - Seeking care precautions discussed including shortness of breath

## 2019-05-27 NOTE — ED Notes (Signed)
Pt ambulated out to waiting room.

## 2019-05-27 NOTE — Progress Notes (Signed)
[facility] Telemedicine Visit  Patient consented to have virtual visit. Method of visit: Video  Encounter participants: Patient: Veronica [name] - located at home  Provider: Patriciaann Clan - located at [facility] Others (if applicable): none   Chief Complaint: anxiety f/u   HPI: [name] is a 28 year old female presenting for anxiety follow-up and additional below:  Chills, in setting of COVID positive: She has been having chills intermittently for the past few days, feels more consistent for the last 2 days.  Does not have a thermometer, sometimes has felt warm with concurrent chills. She has been using Tylenol, noticed minimal help.  Drinking plenty of water.  States it is a little bit cold in her house.  Still experiencing some coughing, denies any shortness of breath, chest pains.  Anxiety: Feels slightly better since last week, however largely unchanged.  She is frustrated because she feels it is difficult to decipher symptoms of her anxiety versus COVID.  Notes her anxiety did increase once found to be COVID positive.  She recently discontinued Prozac and started Lexapro on 6/16, currently at 10 mg.  She also started Atarax as needed, has needed at most once daily.  Feels that it makes her sleepy, however says it makes her feel more relaxed/helps with her anxiety.  She can still function normally after taking this medication.  She still has been unable to identify a strong trigger, however can express when she feels her anxiety is increasing describes as her heart starts racing and feels restless.  Breathing techniques have helped.  Would be interested in establishing with therapy.  No SI/HI, feeling depressed or hopeless.   ROS: per HPI  Pertinent PMHx: Generalized anxiety disorder, COVID positive confirmed on 05/18/2019  Exam:  General: Young female in no acute distress, sitting comfortably in bed HEENT: Mucous membranes appears moist, EOMI Respiratory: Unlabored  breathing, able to speak in full sentences without stopping, no coughing during exam Psych: Appears calm, not overly anxious  Assessment/Plan:  GAD (generalized anxiety disorder) Improving.  Recently switched to Lexapro from Prozac on 6/16 with Atarax 25 mg as needed.  Suspect that several of her symptoms are secondary to COVID and will improve as she continues to recover from this illness. Discussed continuing breathing techniques and lifestyle modifications for stress relief. - Continue Lexapro 10 mg daily, may increase to 20 mg in a few days if tolerating (re-counseled that this medication will take several weeks to reach full effectiveness) - Continue Atarax 25 mg as needed every 6 hours, appears to be effective for acute exacerbations and can tolerate "tiredness" associated with this medication - Reached out to [employer], social work, to set up short-term therapy/follow-up through our office with her as patient is amenable to this and could benefit from additional monitoring  COVID-19 virus detected Continues to be symptomatic with coughing and chills.  Breathing unlabored during evaluation today. Remains in isolation until asymptomatic/afebrile for 2-3 days. -Encouraged staying well-hydrated -May continue to use Tylenol 650 mg every 4-6 hours and/or ibuprofen 400 mg every 4-6 hours - Continue Tessalon Perles for cough - Seeking care precautions discussed including shortness of breath    With shared decision making, set her follow-up to be in approximately 2 weeks --1 month, or sooner as needed.  She will additionally follow-up with [name] for her anxiety.  Time spent during visit with patient: 21 minutes  Patriciaann Clan, DO

## 2019-05-27 NOTE — ED Provider Notes (Signed)
[facility] Note   CSN: [id number] Arrival date & time: 05/27/19  1824    History   Chief Complaint Chief Complaint  Patient presents with   Fever   COVID +    HPI Veronica [name] is a 28 y.o. female with history of GERD, obesity, hiatal hernia, prediabetes, seasonal allergies, anemia, thyroid disease presents for evaluation of ongoing and progressively worsening generalized weakness.  She has been seen multiple times by her PCP via telemedicine as well as physically in our ED in the last couple of weeks for the symptoms.  She tested positive for COVID-19 9 days ago.  In fact, I saw her on 04/22/2019 with similar symptoms.  She reports feeling very fatigued and lightheaded.  She notes nonproductive cough but denies any significant shortness of breath or chest pains.  No abdominal pains, nausea, or vomiting.  While in the midst of my initial examination, the patient stated "I am peeing "and began to urinate on herself while laying prone in the bed.  When asked why she did not verbalize a desire to go to the bathroom to urinate she states "I do not have the energy ".  She does tell me that she has been trying to stay hydrated and drinking 2-3 bottles of water a day but states that she feels continuously dehydrated.  She states that she has been urinating more than usual.  Chart review shows that she followed up with her family medicine doctor today via telemedicine visit.  It appears that she has had some medication changes, recently discontinued Prozac and started Lexapro on 05/24/2019, currently at 10 mg.  Also started Atarax as needed mostly once daily.  At one point she was also taking trazodone at night.  Appears that she did complain that some of these medications were making her sleepy but they do help with her anxiety.     The history is provided by the patient.    Past Medical History:  Diagnosis Date   Abdominal pain 01/06/2012    Anemia    Ankle pain    GERD (gastroesophageal reflux disease)    Hiatal hernia    Obesity (BMI 30-39.9)     Patient Active Problem List   Diagnosis Date Noted   COVID-19 virus detected 05/19/2019   Subclinical hypothyroidism 05/19/2019   Pre-diabetes 05/12/2019   Polyuria 05/09/2019   GAD (generalized anxiety disorder) 05/09/2019   Headache 11/15/2018   Secondary amenorrhea 03/25/2018   Seasonal allergies 03/25/2018   Birth control counseling 08/26/2011   GERD (gastroesophageal reflux disease) 03/28/2011   Obesity (BMI 30-39.9) 02/27/2011   Anemia 02/27/2011    Past Surgical History:  Procedure Laterality Date   CESAREAN SECTION  09/02/2010   For macrosomia, but son was 7 lb 12 oz   TONSILLECTOMY AND ADENOIDECTOMY       OB History   No obstetric history on file.      Home Medications    Prior to Admission medications   Medication Sig Start Date End Date Taking? Authorizing Provider  albuterol (PROVENTIL HFA;VENTOLIN HFA) 108 (90 Base) MCG/ACT inhaler Inhale 2 puffs into the lungs every 4 (four) hours as needed for wheezing or shortness of breath (cough). 10/09/17   Street, [name], [name]  benzonatate (TESSALON) 100 MG capsule Take 1 capsule (100 mg total) by mouth 3 (three) times daily as needed for cough. 05/23/19   Tieara Flitton A, [name]  escitalopram (LEXAPRO) 10 MG tablet Take 1 tablet (10  mg total) by mouth daily. 05/24/19   Lennox SoldersWinfrey, Amanda C, MD  esomeprazole (NEXIUM) 20 MG capsule Take 20 mg by mouth daily as needed (acid reflux). Over the counter    [provider]  FLUoxetine (PROZAC) 20 MG tablet TAKE 1 TAB (20 MG) DAILY FOR 2 WEEKS, THEN INCREASE TO 2 TABS (40 MG) DAILY. Patient taking differently: Take 40 mg by mouth daily.  05/09/19   Wendee BeaversMcMullen, David J, DO  fluticasone (FLONASE) 50 MCG/ACT nasal spray Place 2 sprays into both nostrils daily. Patient taking differently: Place 2 sprays into both nostrils daily as needed for allergies.   03/25/18   Mikell, Antionette PolesAsiyah Zahra, MD  hydrOXYzine (ATARAX/VISTARIL) 25 MG tablet Take 1 tablet (25 mg total) by mouth every 6 (six) hours as needed for anxiety. 05/25/19   Samuel JesterMcManus, Kathleen, DO  ondansetron (ZOFRAN) 4 MG tablet Take 1 tablet (4 mg total) by mouth every 8 (eight) hours as needed for nausea or vomiting. 05/23/19   Luevenia MaxinFawze, Gurinder Toral A, [name]  potassium chloride SA (K-DUR,KLOR-CON) 20 MEQ tablet Take 1 tablet (20 mEq total) 2 (two) times daily by mouth. Patient not taking: Reported on 08/25/2018 10/19/17   Roxy HorsemanBrowning, Robert, [name]  Pseudoeph-Doxylamine-DM-APAP (NYQUIL PO) Take 30 mLs by mouth at bedtime as needed (sleep).    [provider]  traZODone (DESYREL) 50 MG tablet Take 0.5-1 tablets (25-50 mg total) by mouth at bedtime as needed for sleep. 05/20/19   Carney Livinghambliss, Marshall L, MD  famotidine (PEPCID) 20 MG tablet Take 1 tablet (20 mg total) by mouth 2 (two) times daily. Patient not taking: Reported on 08/25/2018 04/09/18 05/24/19  Berton BonMikell, Asiyah Zahra, MD    Family History Family History  Problem Relation Age of Onset   Diabetes Mother    Hypertension Mother    Obesity Mother    Heart attack Mother 6044       Death   Obesity Sister    Osteochondroma Sister    Diabetes Sister    Hypertension Sister     Social History Social History   Tobacco Use   Smoking status: Never Smoker   Smokeless tobacco: Never Used  Substance Use Topics   Alcohol use: No   Drug use: No     Allergies   Morphine and related and Motrin [ibuprofen]   Review of Systems Review of Systems  Constitutional: Positive for chills and fatigue.  Respiratory: Positive for cough. Negative for shortness of breath.   Cardiovascular: Negative for chest pain.  Gastrointestinal: Negative for abdominal pain, nausea and vomiting.  Genitourinary: Negative for decreased urine volume.  Musculoskeletal: Positive for myalgias.  All other systems reviewed and are negative.    Physical Exam Updated  Vital Signs BP 112/68    Pulse 76    Temp 99.4 F (37.4 C) (Oral)    Resp 18    LMP 04/28/2019    SpO2 100%   Physical Exam Vitals signs and nursing note reviewed.  Constitutional:      General: She is not in acute distress.    Appearance: She is well-developed.     Comments: Asleep in bed, laying prone but easily arousable.  Appears drowsy on my examination.  HENT:     Head: Normocephalic and atraumatic.     Mouth/Throat:     Mouth: Mucous membranes are dry.     Comments: Lips are dry Eyes:     General:        Right eye: No discharge.  Left eye: No discharge.     Conjunctiva/sclera: Conjunctivae normal.  Neck:     Vascular: No JVD.     Trachea: No tracheal deviation.  Cardiovascular:     Rate and Rhythm: Normal rate and regular rhythm.  Pulmonary:     Effort: Pulmonary effort is normal.     Breath sounds: Normal breath sounds.  Abdominal:     General: There is no distension.  Skin:    General: Skin is warm and dry.     Findings: No erythema.     Comments: Good skin turgor  Neurological:     Mental Status: She is alert.     Comments: Drowsy but easily arousable.  Fluent speech, no facial droop, cranial nerves appear grossly intact.  Moves extremities spontaneously with what appears to be intact strength.  Psychiatric:        Behavior: Behavior normal.      ED Treatments / Results  Labs (all labs ordered are listed, but only abnormal results are displayed) Labs Reviewed  COMPREHENSIVE METABOLIC PANEL - Abnormal; Notable for the following components:      Result Value   Sodium 134 (*)    Potassium 3.1 (*)    Glucose, Bld 101 (*)    BUN <5 (*)    Calcium 8.7 (*)    ALT 47 (*)    All other components within normal limits  URINALYSIS, ROUTINE W REFLEX MICROSCOPIC - Abnormal; Notable for the following components:   Color, Urine STRAW (*)    Specific Gravity, Urine 1.001 (*)    Hgb urine dipstick MODERATE (*)    All other components within normal limits    CBC WITH DIFFERENTIAL/PLATELET  I-STAT BETA HCG BLOOD, ED ([facility], [facility], [facility] ONLY)  CBG MONITORING, ED    EKG EKG Interpretation  Date/Time:  Friday May 27 2019 19:40:00 EDT Ventricular Rate:  77 PR Interval:    QRS Duration: 99 QT Interval:  379 QTC Calculation: 429 R Axis:   67 Text Interpretation:  Sinus rhythm Atrial premature complex No significant change since last tracing Confirmed by Isla Pence [phone]) on 05/27/2019 7:43:40 PM   Radiology Dg Chest Portable 1 View  Result Date: 05/27/2019 CLINICAL DATA:  Fever and chills. The patient reports test and positive for COVID-19 a week ago. EXAM: PORTABLE CHEST 1 VIEW COMPARISON:  Single-view of the chest 05/26/2019. PA and lateral chest 05/05/2019. CT chest 05/05/2019. FINDINGS: The lungs are clear. Heart size is upper normal. No pneumothorax or pleural fluid. No acute or focal bony abnormality. IMPRESSION: No acute disease. Electronically Signed   By: Inge Rise M.D.   On: 05/27/2019 20:17    Procedures Procedures (including critical care time)  Medications Ordered in ED Medications  sodium chloride 0.9 % bolus 1,000 mL (0 mLs Intravenous Stopped 05/27/19 2118)  potassium chloride SA (K-DUR) CR tablet 40 mEq (40 mEq Oral Given 05/27/19 2118)     Initial Impression / Assessment and Plan / ED Course  I have reviewed the triage vital signs and the nursing notes.  Pertinent labs & imaging results that were available during my care of the patient were reviewed by me and considered in my medical decision making (see chart for details).        Patient who has known COVID positive presents again for evaluation of ongoing chills, myalgias, fatigue.  She is afebrile, initially hypertensive with improvement on subsequent reevaluation's.  She is overall nontoxic in appearance but does appear drowsy.  She is  easily arousable.  On my initial assessment, she began to urinate while in the bed because she did not wish to get up to go  to the bathroom. Her lips appear chapped but she has good skin turgor and does not look extremely dehydrated. Given her complaints of fatigue which have been ongoing for a few weeks now, will obtain blood work, chest x-ray, EKG, give IV fluids and reassess.  Chest x-ray shows no acute cardiopulmonary abnormalities, EKG shows normal sinus rhythm, no significant changes from last tracing.  Her blood work reviewed by me shows no leukocytosis, no anemia.  She has mild hypokalemia which was replenished orally in the ED.  No renal insufficiency.  Her UA does not suggest UTI or nephrolithiasis.  On reevaluation patient is resting comfortably in bed.  She continues to endorse feeling dehydrated and weak although she has had these complaints for several weeks now.  Her O2 saturations have been stable while in the ED and she was ambulated on pulse oximetry without any desaturations.  I explained to her that she does not meet criteria for admission and she states "I wish I did, I wish I did".  In the ED she is ambulatory, tolerating p.o. fluids without difficulty and shows no signs of respiratory distress.  Expressed some desire to have some help at home to care for her during this time.  Management was consulted.  I spoke with Michel BickersWandalyn Rogers, case manager who states that as the patient does not have insurance she does not meet eligibility for home health.  However, the public health nurse may be able to follow-up with her at home as the patient is known COVID positive.  She has been quarantining at home aside from her visits to the ED.  We discussed that her several medication changes over the last few weeks may also be contributing to her fatigue and drowsiness.  I recommended that she cut back from her hydroxyzine as tolerated.  Also recommended close PCP follow-up which it sounds like she has that she has been undergoing telemedicine visits with her PCP multiple times a week.  Discussed strict ED return precautions. Pt  verbalized understanding of and agreement with plan and is safe for discharge home at this time.   Veronica [name] was evaluated in Emergency Department on 05/27/2019 for the symptoms described in the history of present illness. She was evaluated in the context of the global COVID-19 pandemic, which necessitated consideration that the patient might be at risk for infection with the SARS-CoV-2 virus that causes COVID-19. Institutional protocols and algorithms that pertain to the evaluation of patients at risk for COVID-19 are in a state of rapid change based on information released by regulatory bodies including the CDC and federal and state organizations. These policies and algorithms were followed during the patient's care in the ED.     Final Clinical Impressions(s) / ED Diagnoses   Final diagnoses:  Other fatigue  Hypokalemia  Hypertension, unspecified type    ED Discharge Orders    None       Bennye AlmFawze, Jovanna Hodges A, [name] 05/27/19 2340    Jacalyn LefevreHaviland, Julie, MD 05/31/19 2357

## 2019-05-27 NOTE — Assessment & Plan Note (Addendum)
Improving.  Recently switched to Lexapro from Prozac on 6/16 with Atarax 25 mg as needed.  Suspect that several of her symptoms are secondary to COVID and will improve as she continues to recover from this illness. Discussed continuing breathing techniques and lifestyle modifications for stress relief. - Continue Lexapro 10 mg daily, may increase to 20 mg in a few days if tolerating (re-counseled that this medication will take several weeks to reach full effectiveness) - Continue Atarax 25 mg as needed every 6 hours, appears to be effective for acute exacerbations and can tolerate "tiredness" associated with this medication - Reached out to [employer], social work, to set up short-term therapy/follow-up through our office with her as patient is amenable to this and could benefit from additional monitoring

## 2019-05-28 ENCOUNTER — Telehealth: Payer: Self-pay | Admitting: Surgery

## 2019-05-28 NOTE — Telephone Encounter (Signed)
ED CM attempted to contact patient concerning [facility] services, CM left a HIPPA compliant message for return call. CM will make a 2nd attempt later today

## 2019-05-29 ENCOUNTER — Other Ambulatory Visit: Payer: Self-pay

## 2019-05-29 ENCOUNTER — Emergency Department (HOSPITAL_COMMUNITY)
Admission: EM | Admit: 2019-05-29 | Discharge: 2019-05-29 | Disposition: A | Discharge status: Home / Self Care | Payer: Medicaid Other | Attending: Emergency Medicine | Admitting: Emergency Medicine

## 2019-05-29 ENCOUNTER — Encounter (HOSPITAL_COMMUNITY): Payer: Self-pay | Admitting: Emergency Medicine

## 2019-05-29 DIAGNOSIS — R630 Anorexia: Secondary | ICD-10-CM | POA: Insufficient documentation

## 2019-05-29 DIAGNOSIS — R1110 Vomiting, unspecified: Secondary | ICD-10-CM | POA: Insufficient documentation

## 2019-05-29 DIAGNOSIS — M791 Myalgia, unspecified site: Secondary | ICD-10-CM | POA: Insufficient documentation

## 2019-05-29 DIAGNOSIS — U071 COVID-19: Secondary | ICD-10-CM | POA: Insufficient documentation

## 2019-05-29 DIAGNOSIS — R438 Other disturbances of smell and taste: Secondary | ICD-10-CM | POA: Insufficient documentation

## 2019-05-29 DIAGNOSIS — Z79899 Other long term (current) drug therapy: Secondary | ICD-10-CM | POA: Insufficient documentation

## 2019-05-29 MED ORDER — ACETAMINOPHEN 325 MG PO TABS
650.0000 mg | ORAL_TABLET | Freq: Once | ORAL | Status: AC
  Administered 2019-05-29: 650 mg via ORAL
  Filled 2019-05-29: qty 2

## 2019-05-29 MED ORDER — ONDANSETRON 4 MG PO TBDP
4.0000 mg | ORAL_TABLET | Freq: Once | ORAL | Status: AC
  Administered 2019-05-29: 4 mg via ORAL
  Filled 2019-05-29: qty 1

## 2019-05-29 NOTE — ED Triage Notes (Signed)
Pt in via [facility] with c/o n/v, states she vomited [facility] PTA. Pt tested Covid+ 2 wks ago, is anxious about emesis today. Denies any worsened respiratory symptoms

## 2019-05-29 NOTE — Discharge Instructions (Signed)
Alternate between over the counter tylenol and ibuprofen as needed for fever, body aches, and chills.  Drink plenty of water at home.  Follow up with your doctor for further care.

## 2019-05-29 NOTE — ED Provider Notes (Signed)
[facility] EMERGENCY DEPARTMENT Provider Note   CSN: [id number] Arrival date & time: 05/29/19  1612     History   Chief Complaint Chief Complaint  Patient presents with  . Emesis  . Covid +    HPI Veronica [name] is a 28 y.o. female.     The history is provided by the patient and medical records. No language interpreter was used.  Emesis    28 year old obese female recently diagnosed with COVID-19 2 weeks ago presenting complaining of chills.  This is patient's sixth ER visit within the past 10 days.  Patient report since the diagnosis of her infection, she has been feeling well.  She endorsed having body aches, having chills, decrease in appetite, intermittent loss of taste and smell, having trouble sleeping, and having increased anxiety.  She mention that the chills is what bothers her the most.  Initially triage note mention that she was having vomiting, patient states it is due to her anxiety.  She denies worsening shortness of breath.  No report of fever.  Past Medical History:  Diagnosis Date  . Abdominal pain 01/06/2012  . Anemia   . Ankle pain   . GERD (gastroesophageal reflux disease)   . Hiatal hernia   . Obesity (BMI 30-39.9)     Patient Active Problem List   Diagnosis Date Noted  . COVID-19 virus detected 05/19/2019  . Subclinical hypothyroidism 05/19/2019  . Pre-diabetes 05/12/2019  . Polyuria 05/09/2019  . GAD (generalized anxiety disorder) 05/09/2019  . Headache 11/15/2018  . Secondary amenorrhea 03/25/2018  . Seasonal allergies 03/25/2018  . Birth control counseling 08/26/2011  . GERD (gastroesophageal reflux disease) 03/28/2011  . Obesity (BMI 30-39.9) 02/27/2011  . Anemia 02/27/2011    Past Surgical History:  Procedure Laterality Date  . CESAREAN SECTION  09/02/2010   For macrosomia, but son was 7 lb 12 oz  . TONSILLECTOMY AND ADENOIDECTOMY       OB History   No obstetric history on file.      Home Medications     Prior to Admission medications   Medication Sig Start Date End Date Taking? Authorizing Provider  albuterol (PROVENTIL HFA;VENTOLIN HFA) 108 (90 Base) MCG/ACT inhaler Inhale 2 puffs into the lungs every 4 (four) hours as needed for wheezing or shortness of breath (cough). 10/09/17   Street, [name], [name]  benzonatate (TESSALON) 100 MG capsule Take 1 capsule (100 mg total) by mouth 3 (three) times daily as needed for cough. 05/23/19   Fawze, Mina A, [name]  escitalopram (LEXAPRO) 10 MG tablet Take 1 tablet (10 mg total) by mouth daily. 05/24/19   Kathrene Alu, MD  esomeprazole (NEXIUM) 20 MG capsule Take 20 mg by mouth daily as needed (acid reflux). Over the counter    [provider]  FLUoxetine (PROZAC) 20 MG tablet TAKE 1 TAB (20 MG) DAILY FOR 2 WEEKS, THEN INCREASE TO 2 TABS (40 MG) DAILY. Patient taking differently: Take 40 mg by mouth daily.  05/09/19   [place] Bing, DO  fluticasone (FLONASE) 50 MCG/ACT nasal spray Place 2 sprays into both nostrils daily. Patient taking differently: Place 2 sprays into both nostrils daily as needed for allergies.  03/25/18   Mikell, Jeani Sow, MD  hydrOXYzine (ATARAX/VISTARIL) 25 MG tablet Take 1 tablet (25 mg total) by mouth every 6 (six) hours as needed for anxiety. 05/25/19   Francine Graven, DO  ondansetron (ZOFRAN) 4 MG tablet Take 1 tablet (4 mg total) by mouth every  8 (eight) hours as needed for nausea or vomiting. 05/23/19   Luevenia MaxinFawze, Mina A, [name]  potassium chloride SA (K-DUR,KLOR-CON) 20 MEQ tablet Take 1 tablet (20 mEq total) 2 (two) times daily by mouth. Patient not taking: Reported on 08/25/2018 10/19/17   Roxy HorsemanBrowning, Robert, [name]  Pseudoeph-Doxylamine-DM-APAP (NYQUIL PO) Take 30 mLs by mouth at bedtime as needed (sleep).    [provider]  traZODone (DESYREL) 50 MG tablet Take 0.5-1 tablets (25-50 mg total) by mouth at bedtime as needed for sleep. 05/20/19   Carney Livinghambliss, Marshall L, MD  famotidine (PEPCID) 20 MG tablet Take 1  tablet (20 mg total) by mouth 2 (two) times daily. Patient not taking: Reported on 08/25/2018 04/09/18 05/24/19  Berton BonMikell, Asiyah Zahra, MD    Family History Family History  Problem Relation Age of Onset  . Diabetes Mother   . Hypertension Mother   . Obesity Mother   . Heart attack Mother 1844       Death  . Obesity Sister   . Osteochondroma Sister   . Diabetes Sister   . Hypertension Sister     Social History Social History   Tobacco Use  . Smoking status: Never Smoker  . Smokeless tobacco: Never Used  Substance Use Topics  . Alcohol use: No  . Drug use: No     Allergies   Morphine and related and Motrin [ibuprofen]   Review of Systems Review of Systems  Gastrointestinal: Positive for vomiting.     Physical Exam Updated Vital Signs BP (!) 125/56 (BP Location: Right Arm)   Pulse 63   Temp 98.4 F (36.9 C) (Oral)   Resp 18   Wt 135.6 kg   SpO2 100%   BMI 54.68 kg/m   Physical Exam Vitals signs and nursing note reviewed.  Constitutional:      General: She is not in acute distress.    Appearance: She is well-developed. She is obese.  HENT:     Head: Atraumatic.  Eyes:     Conjunctiva/sclera: Conjunctivae normal.  Neck:     Musculoskeletal: Neck supple.  Cardiovascular:     Rate and Rhythm: Normal rate and regular rhythm.  Pulmonary:     Effort: Pulmonary effort is normal.     Breath sounds: Normal breath sounds. No wheezing, rhonchi or rales.  Abdominal:     Palpations: Abdomen is soft.     Tenderness: There is no abdominal tenderness.  Skin:    Findings: No rash.  Neurological:     Mental Status: She is alert and oriented to person, place, and time.  Psychiatric:        Mood and Affect: Mood normal.      ED Treatments / Results  Labs (all labs ordered are listed, but only abnormal results are displayed) Labs Reviewed - No data to display  EKG    Radiology Dg Chest Portable 1 View  Result Date: 05/27/2019 CLINICAL DATA:  Fever and  chills. The patient reports test and positive for COVID-19 a week ago. EXAM: PORTABLE CHEST 1 VIEW COMPARISON:  Single-view of the chest 05/26/2019. PA and lateral chest 05/05/2019. CT chest 05/05/2019. FINDINGS: The lungs are clear. Heart size is upper normal. No pneumothorax or pleural fluid. No acute or focal bony abnormality. IMPRESSION: No acute disease. Electronically Signed   By: Drusilla Kannerhomas  Dalessio M.D.   On: 05/27/2019 20:17    Procedures Procedures (including critical care time)  Medications Ordered in ED Medications  ondansetron (ZOFRAN-ODT) disintegrating tablet 4 mg (4  mg Oral Given 05/29/19 1748)  acetaminophen (TYLENOL) tablet 650 mg (650 mg Oral Given 05/29/19 1748)     Initial Impression / Assessment and Plan / ED Course  I have reviewed the triage vital signs and the nursing notes.  Pertinent labs & imaging results that were available during my care of the patient were reviewed by me and considered in my medical decision making (see chart for details).        BP 139/87   Pulse 65   Temp 98.4 F (36.9 C) (Oral)   Resp (!) 25   Wt 135.6 kg   SpO2 97%   BMI 54.68 kg/m    Final Clinical Impressions(s) / ED Diagnoses   Final diagnoses:  COVID-19 virus infection    ED Discharge Orders    None     5:31 PM Patient here with increased anxiety and chills, diagnosed with COVID-19 nearly 2 weeks ago.  Her primary complaint is chills.  Patient however is afebrile, oxygen at 100% on room air, moving about the room without any difficulty.  She does not exhibit rigor.  I have low suspicion for complication from COVID-19 infection.  I encourage patient to continuous self quarantine and take over-the-counter medication for her symptoms.  Merlene LaughterKimberly R Lopez was evaluated in Emergency Department on 05/29/2019 for the symptoms described in the history of present illness. She was evaluated in the context of the global COVID-19 pandemic, which necessitated consideration that the  patient might be at risk for infection with the SARS-CoV-2 virus that causes COVID-19. Institutional protocols and algorithms that pertain to the evaluation of patients at risk for COVID-19 are in a state of rapid change based on information released by regulatory bodies including the CDC and federal and state organizations. These policies and algorithms were followed during the patient's care in the ED.    Fayrene Helperran, Shavona Gunderman, [name] 05/29/19 1829    Lockie Molauratolo, Adam, DO 05/30/19 [id number]

## 2019-05-29 NOTE — ED Notes (Signed)
Patient verbalizes understanding of discharge instructions. Opportunity for questioning and answers were provided. Armband removed by staff, pt discharged from ED. Ambulated out to lobby  

## 2019-05-31 ENCOUNTER — Telehealth: Payer: Self-pay | Admitting: Licensed Clinical Social Worker

## 2019-05-31 NOTE — Telephone Encounter (Signed)
   Unsuccessful Phone Outreach Note  05/31/2019 Name: Maquita Sandoval Prout MRN: [id number] DOB: [birth year]  Referred by: Patriciaann Clan, DO Reason for referral : Care Coordination (counseling and resources) .  An unsuccessful telephone outreach to patient was attempted today reference the above referral.  Left voice message to call LCSW   Follow Up Plan:  If no return call is received. LCSW will call again in 3 to 7  Days.  [name], [facility]   [phone] 10:39 AM

## 2019-06-03 ENCOUNTER — Other Ambulatory Visit: Payer: Self-pay

## 2019-06-03 ENCOUNTER — Telehealth (INDEPENDENT_AMBULATORY_CARE_PROVIDER_SITE_OTHER): Payer: HRSA Program | Admitting: Family Medicine

## 2019-06-03 DIAGNOSIS — U071 COVID-19: Secondary | ICD-10-CM

## 2019-06-03 NOTE — Progress Notes (Signed)
  [place] Telemedicine Visit  Patient consented to have virtual visit. Method of visit: Video was attempted, but technology challenges prevented patient from using video, so visit was conducted via telephone.  Encounter participants: Patient: Veronica [name] - located at home Provider: Steve Rattler - located at [facility] Others (if applicable): none  Chief Complaint: chills  HPI: Tested + for COVID on 05/18/19  Consistently has the chills "all day long" Feels like body is vibrating Mostly cold, some sweating episodes Taking temperature and it's persistently 98 range, no fevers at all  Not taking ibuprofen as it "made it worser" No cough, no trouble breathing  Has air conditioning at home on and does not know what temperature it should be at.  Eating and drinking normally, no vomiting, some looser stools than normal Has been out of work since tested + and does not know when she can return to work [employer] worrying a lot.   ROS: per HPI  Pertinent PMHx: anxiety  Exam:  Respiratory: speaks in full sentences  Assessment/Plan:  1. COVID-19 virus detected Overall doing well, 16 days out from + covid test, continues to "have chills". Difficult to differentiate if these are true chills vs anxiety. Discussed ways to remain calm. Nothing medically to do at this point. Reasons to return to be seen reviewed. She will need a work note faxed to her job sometime next week as current recommendation is remain out of work 3 weeks after + test.   Time spent during visit with patient: 12 minutes  Lucila Maine, DO PGY-3, [facility] 06/03/2019 2:32 PM

## 2019-06-07 ENCOUNTER — Ambulatory Visit (INDEPENDENT_AMBULATORY_CARE_PROVIDER_SITE_OTHER): Payer: Self-pay | Admitting: Family Medicine

## 2019-06-07 ENCOUNTER — Telehealth: Payer: Self-pay

## 2019-06-07 ENCOUNTER — Other Ambulatory Visit: Payer: Self-pay

## 2019-06-07 ENCOUNTER — Telehealth: Payer: Self-pay | Admitting: Licensed Clinical Social Worker

## 2019-06-07 DIAGNOSIS — F411 Generalized anxiety disorder: Secondary | ICD-10-CM

## 2019-06-07 DIAGNOSIS — U071 COVID-19: Secondary | ICD-10-CM

## 2019-06-07 MED ORDER — MELATONIN 5 MG PO TABS: 1.0000 | ORAL_TABLET | Freq: Every evening | ORAL | 1 refills | Status: DC

## 2019-06-07 MED ORDER — SERTRALINE HCL 25 MG PO TABS: 25.0000 mg | ORAL_TABLET | Freq: Every day | ORAL | 1 refills | Status: DC

## 2019-06-07 NOTE — Telephone Encounter (Signed)
2nd Unsuccessful Phone Outreach Note  06/07/2019 Name: LENISE JR MRN: [id number] DOB: 1991-09-18  Referred by: Patriciaann Clan, DO Reason for referral : Care Coordination (counseling resources)  2nd unsuccessful telephone outreach attempt to Ms. Ila Landowski Eichler today. Left message to call LCSW.  Plan: LCSW will wait for return call, if no return call is received, will reach out to Ms. Mirranda R Walthall again over the next 7 days. If unable to reach Ms. Daniella Dewberry Warnell by phone on the 3rd attempt, will discontinue outreach calls but will be available at any time to provide services to Ms. Hally R Lakeman.   Dr. Higinio Plan has been notified of the outreach and plan.  [name], [place]   [phone] 9:14 AM

## 2019-06-07 NOTE — Progress Notes (Signed)
Subjective:    Patient ID: Veronica [name], female    DOB: 12/27/1990, 28 y.o.   MRN: [id number]   CC: Anxiety follow-up  HPI: Veronica [name] is a 28 year old female presenting discuss the following:  Anxiety follow up: Currently still struggling with this.  Notices "shakes, like I am kind of nervous" throughout her body and head several times a day.  She also describes it as shivering, she also feels cold sometimes.  As she is able to calm herself down, these resolve.  She also notes herself staring off into space more, worrying about things.  She has now been on Lexapro 10mg  for a few weeks, has not been tolerating side effects well including headache, nausea, diarrhea, feeling increased fatigue.  Stopped taking this 2 days ago, already feeling better.  Denies any depressed mood, SI/HI, thoughts of harming herself or others.   She has been listening to music, coloring, deep breaths, and a modified meditations to help with her anxiety.  Has helped a little bit, however still often feels anxious and worries about random things.  Still not sleeping very well, tried trazodone previously prescribed, however does not seem to make a difference for her. Would like to try something else.   COVID positive: Test positive on 6/10, has been 20 days since diagnosis.  She coughed a little bit 2 days ago, otherwise has not had any major respiratory symptoms in several days.  Afebrile for at least the past 6 days.  Has not been taking Tylenol or ibuprofen on a consistent basis, last took Tylenol 2 days ago.  From infectious standpoint, could be cleared to return to work.  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  She is very stressed and anxious to return back to work, given this is where she became COVID positive due to a resident and cannot bear the thought of contracting COVID again.  She is considering applying for  unemployment.  Risk factors for increased risk of severe illness including obesity, reassured she had mild symptomatology most recently-however cannot predict if she were to have this again.  Smoking status reviewed-non-smoker  Review of Systems Per HPI  Patient Active Problem List   Diagnosis Date Noted  . COVID-19 virus detected 05/19/2019  . Subclinical hypothyroidism 05/19/2019  . Pre-diabetes 05/12/2019  . Polyuria 05/09/2019  . GAD (generalized anxiety disorder) 05/09/2019  . Headache 11/15/2018  . Secondary amenorrhea 03/25/2018  . Seasonal allergies 03/25/2018  . Birth control counseling 08/26/2011  . GERD (gastroesophageal reflux disease) 03/28/2011  . Obesity (BMI 30-39.9) 02/27/2011  . Anemia 02/27/2011     Objective:  HR 86 Vitals reviewed, limited due to recent COVID positive  General: NAD, pleasant Cardiac: RRR, normal heart sounds, no murmurs Respiratory: CTAB, normal effort, no crackles, wheezing, no coughing during exam Abdomen: soft, nondistended Extremities: no edema or cyanosis. WWP. Skin: warm and dry Neuro: alert and oriented, no focal deficits, EOMI, non-tremulous Psych: Anxious mood, congruent affect, thought process logical  Assessment & Plan:   GAD (generalized anxiety disorder) Uncontrolled.  Recently self discontinued Lexapro after few weeks due to side effects, already trialed Prozac on short-term.  Would expect any concurrent symptomatology due to COVID should be resolved by now, chill sensation may be secondary to anxiety especially given occurring when she feels more anxious.  - Will start Zoloft 25 mg tablet daily, also instructed patient may take a half a  tablet to start to help minimize side effects, pending tolerating can increase to 50 daily after few weeks - If no benefit with Zoloft, will try SNRI vs BuSpar/mirtazapine (considered mirtazapine given concurrent insomnia, however given body habitus and working towards weight loss, hesitant  to start this medication to avoid increased appetite) -Hydroxyzine every 6 as needed - Patient to recontact [name] for coping techniques/therapy (she is very interested in this), [name] has already reached out and had difficulty contacting - Continue coping techniques - Return precautions sooner than scheduled discussed - Follow-up in approximately 1-2 weeks in the office per patient request (offered telemedicine)   COVID-19 virus detected Can be cleared to return to work for an infectious standpoint, however patient hesitant to return due to anxiety (uncontrolled as above) and concern for reinfection.  She is considering unemployment.  Let patient know we can provide documentation stating return/need for further time if needed.  Follow-up in 1 to 2 weeks, sooner if needed  Darrelyn Hillock, DO Family Medicine Resident PGY-1

## 2019-06-07 NOTE — Patient Instructions (Signed)
Wonderful seeing you today as always.  We are going to stop the Lexapro and start Zoloft 25 mg.  You can break this tablet in half if needed to minimize side effects for the first week, then increase to 25.  If tolerating well, can increase to 50 mg after about 2 weeks.   Please continue coloring, listening to music, deep breaths for anxiety.  Also try to reach out to Neoma Laming today to schedule appointments with her.   I will see you back in approximately 1 week.

## 2019-06-07 NOTE — Telephone Encounter (Signed)
Patient calls nurse line stating she is almost 21 days out from her COVID diagnosis, however is not ready to return to work due to anxiety. Patient was told at her visit today for anxiety if she needed a note for the rest of the week one would be provided. Patient wants to return to work 7/6 "if feeling better." Please advise.  This letter can be faxed to her job [phone] Attention: Estill Bamberg

## 2019-06-08 ENCOUNTER — Encounter: Payer: Self-pay | Admitting: Family Medicine

## 2019-06-08 NOTE — Assessment & Plan Note (Addendum)
Uncontrolled.  Recently self discontinued Lexapro after few weeks due to side effects, already trialed Prozac on short-term.  Would expect any concurrent symptomatology due to COVID should be resolved by now, chill sensation may be secondary to anxiety especially given occurring when she feels more anxious.  - Will start Zoloft 25 mg tablet daily, also instructed patient may take a half a tablet to start to help minimize side effects, pending tolerating can increase to 50 daily after few weeks - If no benefit with Zoloft, will try SNRI vs BuSpar/mirtazapine (considered mirtazapine given concurrent insomnia, however given body habitus and working towards weight loss, hesitant to start this medication to avoid increased appetite) -Hydroxyzine every 6 as needed - Patient to recontact [name] for coping techniques/therapy (she is very interested in this), [name] has already reached out and had difficulty contacting - Continue coping techniques - Return precautions sooner than scheduled discussed - Follow-up in approximately 1-2 weeks in the office per patient request (offered telemedicine)

## 2019-06-08 NOTE — Telephone Encounter (Signed)
Thank you Neoma Laming! She is extremely interested and should be reaching out to you to schedule an appointment.

## 2019-06-08 NOTE — Assessment & Plan Note (Addendum)
Can be cleared to return to work for an infectious standpoint, however patient hesitant to return due to anxiety (uncontrolled as above) and concern for reinfection.  She is considering unemployment.  Let patient know we can provide documentation stating return/need for further time if needed.

## 2019-06-13 ENCOUNTER — Telehealth: Payer: Self-pay | Admitting: Family Medicine

## 2019-06-13 NOTE — Telephone Encounter (Signed)
Pt called stating that she needs a note informing her employer that she is still having covid symptoms, and her anxiety hasn't gotten better since her covid symtpoms . Pt's job fax number [phone].

## 2019-06-13 NOTE — Telephone Encounter (Signed)
Pt is calling to schedule her f/u appointment to discuss her new anxiety medication. Dr. Higinio Plan does not have any available appointments and pt said she would prefer to only see her.   Pt would like for Dr. Higinio Plan to call her to discuss the medications. The best call back number is [phone]

## 2019-06-14 ENCOUNTER — Encounter: Payer: Self-pay | Admitting: Family Medicine

## 2019-06-14 ENCOUNTER — Telehealth: Payer: Self-pay | Admitting: Licensed Clinical Social Worker

## 2019-06-14 NOTE — Telephone Encounter (Signed)
Is it possible to schedule her with me during my afternoon access to care shift on 7/15?   Thank you,  Dr. Higinio Plan

## 2019-06-14 NOTE — Telephone Encounter (Signed)
Noted printed and placed in fax box. Thank you!

## 2019-06-14 NOTE — Telephone Encounter (Signed)
Pt scheduled. Veronica [name], CMA  

## 2019-06-14 NOTE — Telephone Encounter (Signed)
   Phone Outreach Note  06/14/2019 Name: AMBRIELLE KINGTON MRN: [id number] DOB: Nov 26, 1991  Referred by: Patriciaann Clan, DO Reason for referral : Anxiety ;.  LCSW returned phone call to patient reference assistance with managing symptoms of anxiety.    Plan: Phone Appointment scheduled with LCSW July 8th at 2:30  [name], [place]   [phone] 8:59 AM

## 2019-06-15 ENCOUNTER — Telehealth: Payer: Self-pay | Admitting: *Deleted

## 2019-06-15 ENCOUNTER — Ambulatory Visit (INDEPENDENT_AMBULATORY_CARE_PROVIDER_SITE_OTHER): Payer: Self-pay | Admitting: Licensed Clinical Social Worker

## 2019-06-15 DIAGNOSIS — F411 Generalized anxiety disorder: Secondary | ICD-10-CM

## 2019-06-15 NOTE — BH Specialist Note (Signed)
Integrated Behavioral Health Initial Visit  MRN: [id number] Name: Veronica [name] Number of [place] Clinician visits: 1/6 Session Start time: 2:30  Session End time: 3:20 Total time: 50 minutes  Type of Visit: Telephonic Patient location: home  LCSW  location: home persons other than patient participating in visit: none Confirmed patient's address: Yes  Confirmed patient's date of birth: Yes  Confirmed patient's insurance: (not billing for service) No  Discussed confidentiality: Yes  "The purpose of this phone visit is to provide behavioral health care while limiting exposure to the coronavirus (COVID19). " "By engaging in this telephone visit, you consent to the provision of healthcare."  Patient consented to telephone visit: Yes   SUBJECTIVE: Veronica [name] is a 28 y.o. female referred by Dr. Higinio Plan for assistance with managing symptoms of anxiety and connecting for ongoing counseling services. Report of symptoms: difficult with sleep, feeling anxious, shaky, glittery and nerve pain. Started taking sertraline 5 days ago and noticed she has shortness of breath.  ASSESSMENT: Patient is pleasant and engaged in conversation.  Has recent symptoms of anxiety which are exacerbated by testing positive for Covid-19. Patient may benefit from and is in agreement to receive further assessment and brief therapeutic interventions to assist with managing her symptoms until she is connected for ongoing therapy services.   GOALS: 1. Reduce symptoms of: anxiety 2. Increase ability of: coping skills and self-management skills  Plan:  1. Implement relaxed breathing 3 times per day 2.   Review sleep Hygiene 3.   Call therapy resources provided ( [facility] [place]) 4. F/U with LCSW in 1 week _______________________________________________________ Duration of CURRENT symptoms:started May 2019 and worsened with  Covid Impact on function:difficult sleeping and unable to work Psychiatric History - Diagnoses:anxiety - Hospitalizations:  none - Pharmacotherapy: Sertraline  - Outpatient therapy: none  Risk of harm to self or others: No plan to harm self or others  LIFE CONTEXT: Family and Social: lives with son Strengths: resilient School/Work: on leave from work at [employer]: enjoys walking Life Changes: Positive for Covid; unable to identify other changes  INTERVENTION: Client interviewed and appropriate assessments performed. Review of patient's consultants notes from appropriate care team members was performed as part of care coordination referral. Provided client with information about sleep hygiene and counseling resources  ; also utilized engagement as part of my intervention.  Other interventions include:  Medication Monitoring,  Problem-solving teaching/coping strategies and Psychoeducation. Patient advised to contact PCP to share her medication concerns.   Dr. Higinio Plan has been notified by LCSW via in-basket message of this phone visit and patient's concerns with her medication  Veronica [name], [place]   [phone] 3:52 PM

## 2019-06-15 NOTE — Telephone Encounter (Signed)
Pt states that she started to feel jittery and SOB when she started the sertraline on Sunday and Monday.   She did not take the medication today.  Advised to not take med tomorrow and telemedicine visit made with [facility] for tomorrow.  Christen Bame, CMA

## 2019-06-16 ENCOUNTER — Telehealth (INDEPENDENT_AMBULATORY_CARE_PROVIDER_SITE_OTHER): Payer: Medicaid Other | Admitting: Family Medicine

## 2019-06-16 ENCOUNTER — Other Ambulatory Visit: Payer: Self-pay

## 2019-06-16 ENCOUNTER — Telehealth (INDEPENDENT_AMBULATORY_CARE_PROVIDER_SITE_OTHER): Payer: Self-pay | Admitting: Family Medicine

## 2019-06-16 DIAGNOSIS — E0781 Sick-euthyroid syndrome: Secondary | ICD-10-CM | POA: Insufficient documentation

## 2019-06-16 DIAGNOSIS — F411 Generalized anxiety disorder: Secondary | ICD-10-CM

## 2019-06-16 MED ORDER — BUSPIRONE HCL 5 MG PO TABS: 5.0000 mg | ORAL_TABLET | Freq: Two times a day (BID) | ORAL | 0 refills | Status: DC

## 2019-06-16 MED ORDER — HYDROXYZINE HCL 25 MG PO TABS: 25.0000 mg | ORAL_TABLET | Freq: Four times a day (QID) | ORAL | 0 refills | Status: DC | PRN

## 2019-06-16 NOTE — Progress Notes (Deleted)
[place] Telemedicine Visit  Patient consented to have virtual visit. Method of visit: {TELEPHONE VS [id number]}  Encounter participants: Patient: Veronica [name] - located at *** Provider: Danna Hefty - located at *** Others (if applicable): ***  Chief Complaint: Anxiety  HPI: Patient diagnosed with anxiety and followed by PCP. Past treatments include Prozac, Lexapro 10mg  and currently Zoloft ***. She notes Prozac made her feel worse and thus self discontinued this. She experienced side effects including headache, nausea, diarrhea, feeling increased fatigue thus self discontinued Lexapro after several weeks as well. She was started on Zoloft on 6/30 but notes it is giving her shortness of breath and making her more jittery which is the reason for her tele-visit today..  She recently had COVID on 6/10 which worsened her anxiety. She has been following up with [name] for coping techniques/therapy which she notes *** She is also currently on Atarax PRN which she notes taking ***   Insomnia - trazodone 25-50g Elevated TSH, normal T4 on 6/10.   Her reported symptoms include difficulty with sleep, feeling anxious, shaky/jittery  Denies any depressed mood, SI/HI, thoughts of harming herself or others.   ROS: per HPI  Pertinent PMHx: ***  Exam:  Respiratory: normal speech Psych:  Cognition and judgment appear intact. Alert, communicative  and cooperative with normal attention span and concentration. No apparent delusions, illusions, hallucinations  Assessment/Plan:  No problem-specific Assessment & Plan notes found for this encounter.    Time spent during visit with patient: *** minutes  Mina Marble, [place], PGY2

## 2019-06-16 NOTE — Progress Notes (Signed)
[facility]  Patient consented to have virtual visit. Method of visit: Video  Encounter participants: Patient: Veronica LaughterKimberly R Robison - located at home Provider: Joana ReamerKiersten P Fabien [place] - located at home Others (if applicable): None  Chief Complaint: Anxiety  HPI: Patient diagnosed with anxiety and followed by PCP. She describes her anxiety as an "increase sensation in her body" with associated left sided chest pain, heart racing. She is a thinker so she "thinks a lot" so the symptoms "come out of the blue". Self-calming techniques help her recover from these symptoms. She notes that these symptoms started on [birth year], two days after her birthday. She notes this occurs about 2x/day.   Past treatments include Prozac, Lexapro 10mg  and currently Zoloft 12.5mg . She notes Prozac made her feel worse and thus self discontinued this. She experienced side effects including headache, nausea, diarrhea, feeling increased fatigue thus self discontinued Lexapro after several weeks as well. She started Zoloft on 7/5 but notes it is giving her shortness of breath and making her more jittery and more nervous which is the reason for her tele-visit today. She notes that SOB is normal for her but this seems a little more than normal. She notes she starts feeling this a few hours after taking the medicine. She started feeling these symptoms on the evening of 7/7. She did not take the Zoloft yesterday and she didn't feel the worsening SOB. She has been following up with Sammuel Hineseborah Moore for coping techniques/therapy which she notes she is working on her goals.  She is also currently on Atarax PRN which she notes she has not been taking cause it makes her tired. She was unaware she was supposed to use it PRN for anxiety attacks. She was also prescribed Trazodone for insomnia but since it didn't help, she stopped.  Elevated TSH, normal T4 on 6/10.   Her reported symptoms include difficulty with  sleep, feeling anxious, shaky/jittery  Denies any depressed mood, SI/HI, thoughts of harming herself or others.   Notes passing of her mother in 2017 2/2 to massive heart attack. Patient notes she was told her mother "had a panic attack which lead to her massive heart attack". This thought exacerbates her anxiety, especially having a son at home to take care. She also notes some relationship issues. Her father who has suffered two strokes is temporarelly living with patient which increases her stress levels at home as well.   ROS: per HPI  Pertinent PMHx: GAD  Exam:  Respiratory: normal speech Psych:  Cognition and judgment appear intact. Alert, communicative  and cooperative with normal attention span and concentration. No apparent delusions, illusions, hallucinations  Assessment/Plan:  GAD (generalized anxiety disorder) Continues to remain uncontrolled after several medications including Prozac and Lexapro, however has not given any one of these medications enough time to work before self-discontinuing. Some concern of side effects from Zoloft, however open to continuing at current dose. Appears she does have some known triggers in regards to her mother's passing and home stressors.  After discussion and shared decision making, plan as below. Care plan discussed with attending Dr. Manson PasseyBrown. - Continue Zoloft 12.5mg  QD - patient to take in evening as she endorsed sleepiness when taking in the AM. Can consider increase to 25mg  at next follow up visit if continues to be uncontrolled - Will add Buspar 5mg  BID to help argument treatment with Zoloft given severity and difficulty to control symptoms. Consider titrating up if indicated - Continue Hydroxyzine 25mg  q6h  PRN  - instructions of use described at length with patient - If remains uncontrolled with above treatment after adequate titration or continues to experience unwanted side effects, can consider switching to SNRI such as Venlafaxine as this  has been shown to be effected against GAD - Recommended to reach out to psych per Neoma Laming Moore's recommendations - Continue coping mechanisms as previously discussed - return precautions discussed - RTC with PCP in 1-2 weeks for further evaluation and medication adjustments  Meds ordered this encounter  Medications  . hydrOXYzine (ATARAX/VISTARIL) 25 MG tablet    Sig: Take 1 tablet (25 mg total) by mouth every 6 (six) hours as needed for anxiety.    Dispense:  10 tablet    Refill:  0  . busPIRone (BUSPAR) 5 MG tablet    Sig: Take 1 tablet (5 mg total) by mouth 2 (two) times daily.    Dispense:  60 tablet    Refill:  0    Time spent during visit with patient: 25 minutes  Mina Marble, [place], PGY2

## 2019-06-16 NOTE — Progress Notes (Signed)
Pt calls because pharmacy never received the hydroxyzine.  Upon review it was set to print.  Verbal received from Dr. Tarry Kos to resent electronically. Christen Bame, CMA

## 2019-06-16 NOTE — Addendum Note (Signed)
Addended by: Christen Bame D on: 06/16/2019 04:54 PM   Modules accepted: Orders

## 2019-06-16 NOTE — Assessment & Plan Note (Addendum)
Continues to remain uncontrolled after several medications including Prozac and Lexapro, however has not given any one of these medications enough time to work before self-discontinuing. Some concern of side effects from Zoloft, however open to continuing at current dose. Appears she does have some known triggers in regards to her mother's passing and home stressors.  After discussion and shared decision making, plan as below. Care plan discussed with attending Dr. Owens Shark. - Continue Zoloft 12.5mg  QD - patient to take in evening as she endorsed sleepiness when taking in the AM. Can consider increase to 25mg  at next follow up visit if continues to be uncontrolled - Will add Buspar 5mg  BID to help argument treatment with Zoloft given severity and difficulty to control symptoms. Consider titrating up if indicated - Continue Hydroxyzine 25mg  q6h PRN  - instructions of use described at length with patient - If remains uncontrolled with above treatment after adequate titration or continues to experience unwanted side effects, can consider switching to SNRI such as Venlafaxine as this has been shown to be effected against GAD - Recommended to reach out to psych per Neoma Laming Moore's recommendations - Continue coping mechanisms as previously discussed - return precautions discussed - RTC with PCP in 1-2 weeks for further evaluation and medication adjustments

## 2019-06-19 NOTE — Progress Notes (Signed)
Opened in error

## 2019-06-22 ENCOUNTER — Ambulatory Visit (INDEPENDENT_AMBULATORY_CARE_PROVIDER_SITE_OTHER): Payer: Self-pay | Admitting: Family Medicine

## 2019-06-22 ENCOUNTER — Other Ambulatory Visit: Payer: Self-pay

## 2019-06-22 VITALS — BP 110/76 | HR 94 | Wt 279.6 lb

## 2019-06-22 DIAGNOSIS — M940 Chondrocostal junction syndrome [Tietze]: Secondary | ICD-10-CM

## 2019-06-22 DIAGNOSIS — N898 Other specified noninflammatory disorders of vagina: Secondary | ICD-10-CM

## 2019-06-22 DIAGNOSIS — F411 Generalized anxiety disorder: Secondary | ICD-10-CM

## 2019-06-22 MED ORDER — MELOXICAM 7.5 MG PO TABS: 7.5000 mg | ORAL_TABLET | Freq: Every day | ORAL | 0 refills | Status: DC

## 2019-06-22 MED ORDER — FLUCONAZOLE 150 MG PO TABS: 150.0000 mg | ORAL_TABLET | Freq: Once | ORAL | 0 refills | Status: AC

## 2019-06-22 NOTE — Progress Notes (Signed)
Subjective:    Patient ID: Veronica [name], female    DOB: [birth year], 28 y.o.   MRN: [id number]   CC: Follow-up anxiety, vaginal itching  HPI: Veronica [name] is a 28 year old female presenting discuss the following:  Anxiety: Currently has been quite a challenge to get under control.  She is currently taking Zoloft 12.5 mg, started approximately 1 week ago.  She has already self discontinued Prozac and Lexapro after approximately 1.5 weeks of taking each due to side effects.  Did not start the BuSpar prescribed last week as she read it can cause seizures.  Most recently feels that the Zoloft is making her short of breath.  She states the shortness of breath was occasionally present intermittently before starting this, however is ramped up now.  States she is recently been having anterior chest pain that is quite tender to palpation, but seems to flare when she takes in a deep breath in the front.  Recently had COVID, was coughing excessively for weeks.  A lot of her anxiety is surrounded by her mother's early passing due to CAD, so after she has been having this chest pressure noticed that she becomes anxious and short of breath. shortness of breath will then resolve as she calms down. CP will last for a few seconds, sharp.  No particular pattern, including no shortness of breath or chest pain consistently with activity, certain movements, or laying down.  No associated diaphoresis, fatigue, palpitations, paresthesias, fever.  She is also been talking with [name], who is recently give her a few contacts for establishing with a therapist.  She is call these numbers, has not been able to reach anyone yet.  She is also very nervous about returning back to work.  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   Would like a work note to keep her out for the next several months.  With her anxiety, is still able to do her daily ADLs and function.   Vaginal burning/itching: During visit, also complains of few day history of vaginal itching with some irritation.  Currently on her menstrual cycle.  Increased vaginal discharge, white and thick.  Feels like previous yeast infections.  Denies any dysuria, urinary frequency, abdominal pains.  In monogamous relationship, not concern for STDs at this time.  Review of Systems Per HPI    Patient Active Problem List   Diagnosis Date Noted  . Vaginal itching 06/23/2019  . Euthyroid sick syndrome 06/16/2019  . COVID-19 virus detected 05/19/2019  . Subclinical hypothyroidism 05/19/2019  . Pre-diabetes 05/12/2019  . Polyuria 05/09/2019  . GAD (generalized anxiety disorder) 05/09/2019  . Headache 11/15/2018  . Secondary amenorrhea 03/25/2018  . Seasonal allergies 03/25/2018  . Costochondritis 01/25/2018  . Birth control counseling 08/26/2011  . GERD (gastroesophageal reflux disease) 03/28/2011  . Obesity (BMI 30-39.9) 02/27/2011  . Anemia 02/27/2011     Objective:  BP 110/76   Pulse 94   Wt 279 lb 9.6 oz (126.8 kg)   SpO2 100%   BMI 51.14 kg/m  Vitals and nursing note reviewed  General: NAD, pleasant Cardiac: RRR, normal heart sounds, no murmurs, exquisitely tender to palpation at costochondral junction around second/third rib on the left.  Respiratory: CTAB, unlabored breathing, no wheezes, rales, or rhonchi noted Abdomen:  soft, nontender, nondistended Extremities: no edema or cyanosis.  Bilateral radial pulses palpated. Skin: warm and dry, no rashes noted Neuro: alert and oriented, no focal deficits Psych: Anxious and stressed  Assessment & Plan:   GAD (generalized anxiety disorder) Uncontrolled and currently quite a challenge to improve given patient's increase anxiety surrounding medications and limited therapy thus far.  On third trial of  SSRI, Zoloft, at half of normal starting dose to help minimize side effects, however patient concerned is causing shortness of breath.  Spent majority of visit discussing through plan of action, with shared decision making decided to continue Zoloft at current 12.5 mg dose as suspect shortness of breath is likely symptomatology of anxiety surrounding her chest pain discussed below.  Will hold off on BuSpar for now, as patient does not feel comfortable with this medication currently. - Continue Zoloft 12.5 mg dose, may revisit as needed - Hydralazine every 6 as needed - Treatment for costochondritis as below - Continue meeting with Gavin PoundDeborah, telemedicine visit for tomorrow 7/16, and encouraged continued reach out to therapist - May consider re-discussing BuSpar versus transitioning to SNRI in the future, also considering referral to psychiatrist that may ease patient's fear around medications with specific specialist - F/u in 2 weeks, or sooner if needed  Costochondritis Exquisitely tender to left side costochondral joints in the setting of recent several week history of coughing due to COVID.  Low concern for ACS/angina given atypical chest pain more suggestive of above.  Considered PE given pain often worse when taking a deep breath, however much less likely given intermittent/occasional clinical presentation and not tachycardic/SOB on exam. - Meloxicam 7.5 mg daily for 2 weeks (discuss her history of being told not to take ibuprofen due to GERD, however given short course we will move forward with this and monitor for symptoms) - Ice/heat on the region for 20 minutes at a time, 1-2 times daily - Instructed to start exercise/stretches helpful with costochondritis, provided YouTube links  Vaginal itching Clinical description consistent with Candida vaginosis. Discussed trial of Diflucan, will follow-up for pelvic exam/wet prep/GC/chlamydia if persisting following treatment.  - Diflucan 150 mg p.o.  once - Follow-up if symptoms not improving or sooner if worsening   Follow-up in 2 weeks, or sooner if needed.  Leticia PennaSamantha Beard DO Family Medicine Resident PGY-2

## 2019-06-22 NOTE — Patient Instructions (Signed)
It was so nice seeing you today.  For the inflammation around your ribs, you can take meloxicam once daily to help with inflammation.  You can also use ice/heat pads for 20 minutes 1-2 times a day to help with inflammation.  Lastly, you may do some exercises 1-2 times daily, YouTube has great exercises for "costochondritis ".  We will continue your Zoloft as is.  Please let me know if your chest pressure or shortness of breath does not get better in the 1-2 weeks or sooner if worsening, and we can reassess.

## 2019-06-23 ENCOUNTER — Ambulatory Visit: Payer: Medicaid Other

## 2019-06-23 ENCOUNTER — Telehealth: Payer: Self-pay | Admitting: Family Medicine

## 2019-06-23 ENCOUNTER — Telehealth: Payer: Self-pay | Admitting: Licensed Clinical Social Worker

## 2019-06-23 DIAGNOSIS — N898 Other specified noninflammatory disorders of vagina: Secondary | ICD-10-CM | POA: Insufficient documentation

## 2019-06-23 NOTE — Assessment & Plan Note (Signed)
Uncontrolled and currently quite a challenge to improve given patient's increase anxiety surrounding medications and limited therapy thus far.  On third trial of SSRI, Zoloft, at half of normal starting dose to help minimize side effects, however patient concerned is causing shortness of breath.  Spent majority of visit discussing through plan of action, with shared decision making decided to continue Zoloft at current 12.5 mg dose as suspect shortness of breath is likely symptomatology of anxiety surrounding her chest pain discussed below.  Will hold off on BuSpar for now, as patient does not feel comfortable with this medication currently. - Continue Zoloft 12.5 mg dose, may revisit as needed - Hydralazine every 6 as needed - Treatment for costochondritis as below - Continue meeting with Neoma Laming, telemedicine visit for tomorrow 7/16, and encouraged continued reach out to therapist - May consider re-discussing BuSpar versus transitioning to SNRI in the future, also considering referral to psychiatrist that may ease patient's fear around medications with specific specialist - F/u in 2 weeks, or sooner if needed

## 2019-06-23 NOTE — Assessment & Plan Note (Signed)
Exquisitely tender to left side costochondral joints in the setting of recent several week history of coughing due to COVID.  Low concern for ACS/angina given atypical chest pain more suggestive of above.  Considered PE given pain often worse when taking a deep breath, however much less likely given intermittent/occasional clinical presentation and not tachycardic/SOB on exam. - Meloxicam 7.5 mg daily for 2 weeks (discuss her history of being told not to take ibuprofen due to GERD, however given short course we will move forward with this and monitor for symptoms) - Ice/heat on the region for 20 minutes at a time, 1-2 times daily - Instructed to start exercise/stretches helpful with costochondritis, provided YouTube links

## 2019-06-23 NOTE — Telephone Encounter (Signed)
Pt called about her medication being too strong, and she'd like a call back. Please give pt a call back.

## 2019-06-23 NOTE — Telephone Encounter (Signed)
   Consult Note  06/23/2019 Name: Adaleena Mooers Durney MRN: [id number] DOB: 08-08-1991  Referred by: Patriciaann Clan, DO Reason for referral : Appointment ([place] phone appointment)    Royetta Crochet Ewer is a 28 y.o. year old female who sees Patriciaann Clan, DO for primary care.  LCSW called patient for scheduled phone appointment.  Patient requested to rescedule due to getting days mixed up.  Phone appointment rescheduled with LCSW for July 21st at 3:00.     [name], LCSW [facility]   [phone] 2:27 PM

## 2019-06-24 ENCOUNTER — Encounter: Payer: Self-pay | Admitting: Family Medicine

## 2019-06-24 NOTE — Telephone Encounter (Signed)
Pt calls back, states when she takes aleve she has SOB.  This has resolved now.     Advised to not take aleve anymore. She wants to know if she can take meloxicam as she did not have issues with the dose she took.  Advised to take meloxicam but to stop if she starts to have SOB and call 911.    Pt agreeable. Christen Bame, CMA

## 2019-06-24 NOTE — Telephone Encounter (Signed)
Pt is calling back and would like for Dr. Higinio Plan to call her back to discuss the medications again. She also said she needs to discuss issues with the note for her employer.

## 2019-06-24 NOTE — Telephone Encounter (Signed)
Call back patient.  States she was worried about how strong meloxicam is.  She picked up some Aleve to 220 mg tablets at the store, wondering if she could take these instead.  Recommended 1 to 2 tablets of Aleve 220 mg twice daily for the next week or as inflammation resolves.  Additionally discussed no longer using meloxicam if she will be taking the Aleve.  Patient endorsed understanding.  Patriciaann Clan, DO

## 2019-06-24 NOTE — Assessment & Plan Note (Signed)
Clinical description consistent with Candida vaginosis. Discussed trial of Diflucan, will follow-up for pelvic exam/wet prep/GC/chlamydia if persisting following treatment.  - Diflucan 150 mg p.o. once - Follow-up if symptoms not improving or sooner if worsening

## 2019-06-28 ENCOUNTER — Telehealth: Payer: Self-pay | Admitting: Family Medicine

## 2019-06-28 ENCOUNTER — Other Ambulatory Visit: Payer: Self-pay

## 2019-06-28 ENCOUNTER — Ambulatory Visit (INDEPENDENT_AMBULATORY_CARE_PROVIDER_SITE_OTHER): Payer: Self-pay | Admitting: Licensed Clinical Social Worker

## 2019-06-28 DIAGNOSIS — F411 Generalized anxiety disorder: Secondary | ICD-10-CM

## 2019-06-28 NOTE — BH Specialist Note (Signed)
Integrated Behavioral Health Follow Up Visit  MRN: [id number] Name: Veronica [name]  Number of [place] Clinician visits: 2/6 Session Start time: 3:00  Session End time: 3:30 Total time: 30 minutes Type of Visit: Telephonic Confirmed patient's name: Yes  Confirmed patient's date of birth: Yes  Confirmed patient's insurance: (not billing for service) Yes  Discussed confidentiality: Yes   "The purpose of this phone visit is to provide behavioral health care while limiting exposure to the coronavirus (COVID19). " "By engaging in this telephone visit, you consent to the provision of healthcare."  Patient consented to telephone visit: Yes   Reason for follow-up: Continue brief intervention to assist patient with managing symptoms of anxiety, as well as managing stressors .   Report of symptoms: feeling anxious but not every day; difficulty with sleep at times.  Patient is taking 1/2 tablet of zoloft.  Reports not missing any doses.   ASSESSMENT: Patient is pleasant and engaged in conversation.  Has noticed she is starting to feel a little better. Not feeling anxious every day and has been able to sleep good for a few days but has not been consistent.  Patient is making progress towards goal based on conversation and improvement in symptoms. Patient continues to experience symptoms of anxiety and may benefit from, and is in agreement to continue ongoing assessment and brief therapeutic interventions to assist with managing her symptoms. Patient has not started taking the full tab of zoloft 25mg  as per PCP but states she will start tomorrow.  GOALS:Patient will: 1. Reduce symptoms of: anxiety 2. Increase knowledge and/or ability of: coping skills and self-management skills  PLAN:  1.F/U with Veronica [name] in 1 week 2.Behavioral recommendations: continue relaxed breathing 3 time daily and more when starting to feel anxious. 3.Referral: Counselor F/U on resources provided    Intervention: Client interviewed and appropriate assessments performed. Provided patient with information about adjusting her technique for relaxed breathing ; also utilized engagement as part of my intervention.  Other interventions include:,Solution-Focused Strategies and Brief CBT, Reflective listening, Behavioral Therapy (Relaxed breathing); Psychoeducation     Dr Higinio Plan has been notified of this intervention and plan.  Veronica [name], [facility]   [phone] 3:49 PM

## 2019-06-28 NOTE — Telephone Encounter (Signed)
Pt called to inform her PCP that she needs a note from her PCP for her work stating how much longer she could have to be out of work due to her anxiety. Please give pt a call back.

## 2019-06-30 NOTE — Telephone Encounter (Signed)
Called patient to discuss letter for being out of work for anxiety, previously requesting a few months to get herself together.  No answer, left voicemail.  Feel that she would most benefit from additional evaluation from psychiatry.  Will attempt to reach patient tonight to discuss this.  Patriciaann Clan, DO

## 2019-06-30 NOTE — Telephone Encounter (Signed)
Pt called back returning Dr.Beard's call. I informed pt that Dr.Beard stated in previous notes that she'd give her a call back.

## 2019-06-30 NOTE — Telephone Encounter (Signed)
Call patient to discuss anxiety and request for off work.  States today and yesterday were the first days where she feels like she is having "a good day ". She started taking a full tablet, Zoloft 25 mg, yesterday from the 12.5.  She is quite worried to return to work right now since she is just starting to feel better.  Previously was having anxiety attacks throughout the day, which would make her work quite challenging.  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  This provides her extra stress and anxiety because she had already contracted COVID previously during work while wearing PPE.  She is hoping to have a little bit more time to continue improving her mental health prior to return.  Through shared decision making, ultimately decided on an additional week to allow her medication to continue taking effect and for her to continue working through coping strategies.  If she feels that she needs additional time past this, will refer to psychiatry.   Will write a letter and fax to her employer [employer], [phone].   Patriciaann Clan

## 2019-07-01 ENCOUNTER — Encounter: Payer: Self-pay | Admitting: Family Medicine

## 2019-07-05 ENCOUNTER — Ambulatory Visit (INDEPENDENT_AMBULATORY_CARE_PROVIDER_SITE_OTHER): Payer: Self-pay | Admitting: Licensed Clinical Social Worker

## 2019-07-05 ENCOUNTER — Other Ambulatory Visit: Payer: Self-pay

## 2019-07-05 DIAGNOSIS — F419 Anxiety disorder, unspecified: Secondary | ICD-10-CM

## 2019-07-05 NOTE — BH Specialist Note (Signed)
Integrated Behavioral Health Follow Up Visit  MRN: [id number] Name: Veronica [name]  Number of [place] Clinician visits: 3/6 Session Start time: 3:00  Session End time: 330 Total time: 30 minutes Type of Visit: Telephonic Patient location: home LCSW  location: home persons other than patient participating in visit: 0 Confirmed patient's address: Yes  Confirmed patient's date of birth: Yes  Discussed confidentiality: Yes   "The purpose of this phone visit is to provide behavioral health care while limiting exposure to the coronavirus (COVID19). " "By engaging in this telephone visit, you consent to the provision of healthcare."   Reason for follow-up: Continue brief intervention to assist patient with managing symptoms of anxiety, as well as managing stressors .   Report of symptoms: continues to worry, difficult with relaxing and feeling anxious,  ASSESSMENT: Patient is making progress towards goal.  States she is starting to feel better not as anxious but continues to worry about things. She is doing well with relaxed breathing, behavioral activation and calling therapy resources provided.  Patient is getting out of the house more and is looking forward to starting her new job.  Patient continues to experience symptoms of generalized anxiety and may benefit from, and is in agreement to establish with ongoing counseling to assist with managing her symptoms.  LCSW will continue providing brief interventions over the next week until she is connected to ongoing therapy.   GOALS:Patient will: 1. Reduce symptoms of: anxiety and stress 2. Increase knowledge and/or ability of: coping skills and stress reduction  3.   Establish with ongoing therapy services  PLAN:  1.F/U with LCSW in 1 week 2.Behavioral recommendations: continue relaxed breathing, behavior activation and call [facility] to schedule an appointment. 3.Referral: Counselor  Intervention: Client  interviewed and appropriate assessments performed. Provided patient information about thoughts and feelings ; also utilized engagement as part of my intervention.  Other interventions include:,Solution-Focused Strategies and Brief CBT, Reflective listening, Behavioral Therapy (Relaxed breathing); Psychoeducation    GAD-7=16, indication of: severe anxiety.   Dr Higinio Plan has been notified of this intervention and plan.  GAD 7 : Generalized Anxiety Score 07/05/2019  Nervous, Anxious, on Edge 2  Control/stop worrying 3  Worry too much - different things 2  Trouble relaxing 1  Restless 2  Easily annoyed or irritable 3  Afraid - awful might happen 3  Total GAD 7 Score 16  Anxiety Difficulty Not difficult at all   [name], [place]   [phone] 3:48 PM

## 2019-07-11 ENCOUNTER — Other Ambulatory Visit: Payer: Self-pay | Admitting: Family Medicine

## 2019-07-11 DIAGNOSIS — F411 Generalized anxiety disorder: Secondary | ICD-10-CM

## 2019-07-11 MED ORDER — HYDROXYZINE HCL 25 MG PO TABS: 25.0000 mg | ORAL_TABLET | Freq: Four times a day (QID) | ORAL | 0 refills | Status: DC | PRN

## 2019-07-11 NOTE — Telephone Encounter (Signed)
Pt called today to see if she could get a refill in her medication hydOXYzine. Please give pt a call.

## 2019-07-12 ENCOUNTER — Ambulatory Visit (HOSPITAL_COMMUNITY): Payer: Medicaid Other

## 2019-07-12 ENCOUNTER — Ambulatory Visit (INDEPENDENT_AMBULATORY_CARE_PROVIDER_SITE_OTHER): Payer: Self-pay | Admitting: Family Medicine

## 2019-07-12 ENCOUNTER — Other Ambulatory Visit: Payer: Self-pay

## 2019-07-12 ENCOUNTER — Ambulatory Visit (HOSPITAL_COMMUNITY)
Admission: RE | Admit: 2019-07-12 | Discharge: 2019-07-12 | Disposition: A | Discharge status: Home / Self Care | Payer: Medicaid Other | Source: Ambulatory Visit | Attending: Family Medicine | Admitting: Family Medicine

## 2019-07-12 ENCOUNTER — Encounter: Payer: Self-pay | Admitting: Family Medicine

## 2019-07-12 VITALS — BP 110/62 | HR 59 | Wt 269.0 lb

## 2019-07-12 DIAGNOSIS — R079 Chest pain, unspecified: Secondary | ICD-10-CM | POA: Insufficient documentation

## 2019-07-12 DIAGNOSIS — M940 Chondrocostal junction syndrome [Tietze]: Secondary | ICD-10-CM

## 2019-07-12 DIAGNOSIS — F411 Generalized anxiety disorder: Secondary | ICD-10-CM

## 2019-07-12 DIAGNOSIS — K219 Gastro-esophageal reflux disease without esophagitis: Secondary | ICD-10-CM

## 2019-07-12 DIAGNOSIS — E669 Obesity, unspecified: Secondary | ICD-10-CM

## 2019-07-12 DIAGNOSIS — I498 Other specified cardiac arrhythmias: Secondary | ICD-10-CM | POA: Insufficient documentation

## 2019-07-12 NOTE — Progress Notes (Signed)
Subjective:    Patient ID: Veronica [name], female    DOB: 21-Oct-1991, 28 y.o.   MRN: [id number]   CC: anxiety/rib pain   HPI: Veronica [name] is a 28 year old female presenting discuss the following:  Anixety: Continues to improve, currently taking Zoloft 25 mg.  Tolerating well now.  Occasionally will have bouts of anxiety, occurs every a couple of days rather than a daily basis.  She has been continuing to meet with Neoma Laming that is been very helpful, working towards establishing long-term therapy.  She is also very excited that she is starting a new job on Thursday.  This comes with a salary increase and no longer has to work any weekends.  She thinks is going to be quite a stress relief for her.  She is also been doing a daily walking/running regimen, has already amazingly lost ~20 pounds since June. She is feeling better and really excited as she is starting to see some change. No further shortness of breath that she was previously experiencing.  She is also wondering if she should start taking Ashwagandha 600mg  for her anxiety.   Rib pain: This is almost resolved, however yesterday she picked up to large containers of 30 pack of waters and instantly felt a sharp twinge.  Notices the anterior rib pain worse with movement.  Very tender to palpation of the costochondral junction anteriorly when she presses it.    She occasionally experiencing some burning-like chest pain that lasts for a few seconds, similar to previous heartburn. Usually after eating. Does have a history of GERD, on Nexium. She is very concerned it is her heart, especially with the sharp MSK pain she'll have as well. No worsening of pain with movement, DOE. Does have a early cardiac family history (40-50s).      Smoking status reviewed-non-smoker   Review of Systems Per HPI    Patient Active Problem List   Diagnosis Date Noted  . Vaginal itching 06/23/2019  . Euthyroid sick syndrome 06/16/2019  . COVID-19 virus detected  05/19/2019  . Subclinical hypothyroidism 05/19/2019  . Pre-diabetes 05/12/2019  . Polyuria 05/09/2019  . GAD (generalized anxiety disorder) 05/09/2019  . Headache 11/15/2018  . Secondary amenorrhea 03/25/2018  . Seasonal allergies 03/25/2018  . Costochondritis 01/25/2018  . Birth control counseling 08/26/2011  . GERD (gastroesophageal reflux disease) 03/28/2011  . Obesity (BMI 30-39.9) 02/27/2011  . Anemia 02/27/2011     Objective:  BP 110/62   Pulse (!) 59   Wt 269 lb (122 kg)   SpO2 98%   BMI 49.20 kg/m  Vitals and nursing note reviewed  General: NAD, pleasant Cardiac: RRR, normal heart sounds, no murmurs, anterior costochondral region very tender to palpation.  Respiratory: CTAB, normal effort Abdomen: soft, nontender, nondistended Extremities: no edema or cyanosis. WWP. Full ROM of upper extremities bilaterally.  Skin: warm and dry, no rashes noted Neuro: alert and oriented, no focal deficits Psych: normal affect  Assessment & Plan:    GAD (generalized anxiety disorder) Under much better control. Will increase Zoloft to 50mg  from 25. Suspect new job will also be a stress relief for her, as her previous location was a source of stress.  --Cont Zoloft  --Cont working with Neoma Laming and moving towards establishing long term therapy  --Hydroxyzine as needed  --Cont coping strategies (music, walking etc)    Costochondritis Resolved, however now with recurrence after heavy lifting. Quite tender to palpation of costochondral junction. Patient quite concerned/anxious that is related to her heart,  EKG in the office NSR/sinus arrhythmia today. While she does have a known early familial cardiac history (mother passed away in 6440's due to CAD/DM), have low suspicion that these pains are secondary to cardiac etiology given atypical symptomatology.  --Ice in region  --Meloxicam daily for few additional days (1 week maximum)  --Cont ROM exercises   Obesity (BMI 30-39.9)  Congratulated patient on her amazing efforts towards well balanced eating and regular physical activity. Down to 269lbs today from 298. Due for repeat A1c/thyroid studies in 1 month. Will also obtain lipid panel at that time.   GERD (gastroesophageal reflux disease) Intermittent complaint of heartburn and abdominal pain. Will have patient return to discuss this further.    F/u at her convenience to discuss the above, next 1-2 weeks.    Leticia PennaSamantha Alica Shellhammer DO Family Medicine Resident PGY-2

## 2019-07-12 NOTE — Patient Instructions (Signed)
Wonderful to see you as always.  Increase your Zoloft to 50 mg daily, 2 tablets.  After you run out of the 25 mg tablets, I will send in a refill for 50 mg.  Keep up the amazing work, I am so proud of what you have accomplished thus far!   I would like to see you back in the next 1-2 weeks at your convenience to discuss your acid reflux.  Please let me know if you need anything in the meantime.

## 2019-07-14 ENCOUNTER — Telehealth: Payer: Self-pay | Admitting: Licensed Clinical Social Worker

## 2019-07-14 ENCOUNTER — Other Ambulatory Visit: Payer: Self-pay

## 2019-07-14 ENCOUNTER — Encounter: Payer: Self-pay | Admitting: Family Medicine

## 2019-07-14 ENCOUNTER — Ambulatory Visit: Payer: Medicaid Other

## 2019-07-14 NOTE — Telephone Encounter (Signed)
Care Coordination  Telephone Outreach Note  07/14/2019 Name: Veronica [name] MRN: [id number] DOB: 03/25/1991  Referred by: Patriciaann Clan, DO Reason for referral : Appointment Phone appointment with patient today with LCSW. Patient is a a location where she is unable to take the call and wants to reschedule for tomorrow.  Plan: appointment rescheduled 07/15/2019  [name], LCSW Clinical Social Worker [place] [facility] [place]   [phone] 3:47 PM

## 2019-07-14 NOTE — Assessment & Plan Note (Signed)
Under much better control. Will increase Zoloft to 50mg  from 25. Suspect new job will also be a stress relief for her, as her previous location was a source of stress.  --Cont Zoloft  --Cont working with Neoma Laming and moving towards establishing long term therapy  --Hydroxyzine as needed  --Cont coping strategies (music, walking etc)

## 2019-07-14 NOTE — Assessment & Plan Note (Signed)
Intermittent complaint of heartburn and abdominal pain. Will have patient return to discuss this further.

## 2019-07-14 NOTE — Assessment & Plan Note (Addendum)
Congratulated patient on her amazing efforts towards well balanced eating and regular physical activity. Down to 269lbs today from 298. Due for repeat A1c/thyroid studies in 1 month. Will also obtain lipid panel at that time.

## 2019-07-14 NOTE — Assessment & Plan Note (Addendum)
Resolved, however now with recurrence after heavy lifting. Quite tender to palpation of costochondral junction. Patient quite concerned/anxious that is related to her heart, EKG in the office NSR/sinus arrhythmia today. While she does have a known early familial cardiac history (mother passed away in 67's due to CAD/DM), have low suspicion that these pains are secondary to cardiac etiology given atypical symptomatology.  --Ice in region  --Meloxicam daily for few additional days (1 week maximum)  --Cont ROM exercises

## 2019-07-15 ENCOUNTER — Ambulatory Visit: Payer: Medicaid Other | Admitting: Licensed Clinical Social Worker

## 2019-07-15 ENCOUNTER — Other Ambulatory Visit: Payer: Self-pay

## 2019-07-15 DIAGNOSIS — F411 Generalized anxiety disorder: Secondary | ICD-10-CM

## 2019-07-15 DIAGNOSIS — Z7189 Other specified counseling: Secondary | ICD-10-CM

## 2019-07-15 NOTE — BH Specialist Note (Signed)
Integrated Behavioral Health Follow Up Visit  MRN: [id number] Name: Surie Suchocki Hartje  Number of [facility] Clinician visits: 4/6 Session Start time: 2:30  Session End time: 3:00 Total time: 30 minutes Type of Visit: Telephonic Patient location: home LCSW  location: home persons other than patient participating in visit: none Discussed confidentiality: Yes   "The purpose of this phone visit is to provide behavioral health care while limiting exposure to the coronavirus (COVID19). " "By engaging in this telephone visit, you consent to the provision of healthcare."   Reason for follow-up: Continue brief intervention to assist patient with managing symptoms of anxiety, as well as managing stressors .   Report of symptoms: feeling less stress.  Started increased zoloft from 25 to 50MG last week.  Reports no concerns or side effects.   ASSESSMENT: Patient is pleasant and engaged in conversation. She is making progress towards goal.  States over all she is feel better. This is evident by being able to redirect thoughts, decrease in anxiety flare ups from 3 per week to 1 per week and being able to implement relaxed breathing when having an anxiety attack . Patient continues to experience symptoms of anxiety which have decreased significantly. Patient has not connected with therapy resources provided. GOALS:Patient will: 1. Reduce symptoms of: anxiety and stress 2. Increase knowledge and/or ability of: coping skills and self-management skills  3.   Connect with ongoing therapist Recommendation for Patient : Patient may benefit from, and is in agreement to start ongoing therapy. She will have 2 more brief therapeutic interventions phone meetins with LCSW to assist with managing her symptoms. Review educational information provided to patient "tool kit for thriving during a crisis and information via [place] TO  HELP YOU WORRY LESS PLAN:  1.F/U with LCSW in 2 weeks 2.Behavioral recommendations: see recommendations above 3.Referral: Counselor ( F/[facility] with [facility]).  Intervention: Client interviewed and appropriate assessments performed. Provided patient educational information. ; also utilized engagement as part of my intervention.  Other interventions include:,Solution-Focused Strategies and Mindfulness or Relaxation Training, Reflective listening; Problem-solving teaching/coping strategies and Psychoeducation    [name], [place] / [place]   [phone] 3:22 PM

## 2019-07-19 ENCOUNTER — Other Ambulatory Visit: Payer: Self-pay

## 2019-07-19 ENCOUNTER — Ambulatory Visit (INDEPENDENT_AMBULATORY_CARE_PROVIDER_SITE_OTHER): Payer: Self-pay | Admitting: Family Medicine

## 2019-07-19 VITALS — BP 125/70 | HR 57 | Ht 62.0 in | Wt 267.8 lb

## 2019-07-19 DIAGNOSIS — K219 Gastro-esophageal reflux disease without esophagitis: Secondary | ICD-10-CM

## 2019-07-19 MED ORDER — SALINE SPRAY 0.65 % NA SOLN: 1.0000 | NASAL | 1 refills | Status: DC | PRN

## 2019-07-19 NOTE — Progress Notes (Signed)
   Subjective:    Patient ID: Veronica [name], female    DOB: Jun 06, 1991, 28 y.o.   MRN: [id number]   CC: GERD   HPI: Veronica [name] is a 28 year old female presenting discuss the following:  GERD: Has been experiencing the symptoms for several years. Taking Nexium 40 mg also for the last couple of years.  Continues to have breakthrough symptoms, at least 3 times a week after eating including a burning sensation in her epigastric area refluxing upwards into her upper chest.  Sometimes aches in her epigastric area.  Denies any recent increase in the symptoms, has been quite some time that she is continued to have persistence of these.  Does have a history of a small sliding-type hiatal hernia noted incidentally on imaging.  Denies any associated early satiety, unexpected weight loss, globus sensation, nausea, vomiting, change in bowel movements, dysphasia, family history of GI cancers.  Additionally encouraged that she started the 50 mg of Zoloft.  Is having less bouts of anxiety.  Smoking status reviewed, non-smoker  Review of Systems Per HPI   Patient Active Problem List   Diagnosis Date Noted  . Euthyroid sick syndrome 06/16/2019  . COVID-19 virus detected 05/19/2019  . Subclinical hypothyroidism 05/19/2019  . Pre-diabetes 05/12/2019  . GAD (generalized anxiety disorder) 05/09/2019  . Seasonal allergies 03/25/2018  . Costochondritis 01/25/2018  . GERD (gastroesophageal reflux disease) 03/28/2011  . Obesity (BMI 30-39.9) 02/27/2011  . Anemia 02/27/2011     Objective:  BP 125/70   Pulse (!) 57   Ht 5\' 2"  (1.575 m)   Wt 267 lb 12.8 oz (121.5 kg)   SpO2 97%   BMI 48.98 kg/m  Vitals and nursing note reviewed  General: NAD, pleasant Cardiac: Slightly tachycardic, normal heart sounds, no murmurs Respiratory: CTAB, normal effort Abdomen: soft, nontender but points to the area that will be occasionally tender in her epigastric region, nondistended, normoactive bowel sounds  Extremities: no edema or cyanosis. WWP. Skin: warm and dry, no rashes noted Neuro: alert and oriented, no focal deficits Psych: normal affect, slightly anxious  Assessment & Plan:   GERD (gastroesophageal reflux disease) Concern for persistent (>3 days/wk) symptoms for the past several years despite PPI use consistently during this time.  While there is no additional red flags concerning for the presentation, do believe this warrants further evaluation by GI. Has never had an endoscopic evaluation.  Could also consider ulcer disease/dyspepsia secondary to H. pylori given concurrent epigastric discomfort, however cannot evaluate for this at this time given PPI use. Less likely due to NSAIDs as she is previously avoided these strictly up until the past few weeks. - Ambulatory referral to GI - Continue omeprazole 40 mg - Avoid triggers, sit upright after meals - Return precautions discussed, including concerning red flags as above   Follow-up if not improving or sooner if worsening, will be following up with the next few weeks for her anxiety.   [facility] Resident PGY-2

## 2019-07-19 NOTE — Patient Instructions (Signed)
It was wonderful seeing you today.  I am going to go ahead and place referral to GI to assess if further evaluation is needed via a scope to visualize your esophagus/stomach.  We will keep your medication as is for the meantime.  Continue working towards your diet and exercise, you are doing amazing job!   I have also sent in some motion saline spray for your nasal congestion.  If this progresses or if you have any worsening symptoms, please let me know.

## 2019-07-20 ENCOUNTER — Other Ambulatory Visit: Payer: Self-pay

## 2019-07-20 DIAGNOSIS — Z20822 Contact with and (suspected) exposure to covid-19: Secondary | ICD-10-CM

## 2019-07-21 ENCOUNTER — Other Ambulatory Visit: Payer: Self-pay

## 2019-07-21 DIAGNOSIS — F411 Generalized anxiety disorder: Secondary | ICD-10-CM

## 2019-07-22 ENCOUNTER — Other Ambulatory Visit: Payer: Self-pay | Admitting: Family Medicine

## 2019-07-22 ENCOUNTER — Encounter: Payer: Self-pay | Admitting: Family Medicine

## 2019-07-22 DIAGNOSIS — F411 Generalized anxiety disorder: Secondary | ICD-10-CM

## 2019-07-22 LAB — NOVEL CORONAVIRUS, NAA: SARS-CoV-2, NAA: NOT DETECTED

## 2019-07-22 MED ORDER — SERTRALINE HCL 50 MG PO TABS: 50.0000 mg | ORAL_TABLET | Freq: Every day | ORAL | 3 refills | Status: DC

## 2019-07-22 MED ORDER — HYDROXYZINE HCL 25 MG PO TABS: 25.0000 mg | ORAL_TABLET | Freq: Four times a day (QID) | ORAL | 1 refills | Status: DC | PRN

## 2019-07-22 NOTE — Telephone Encounter (Signed)
Apologies.  I was trying to electronically send in 50 mg tablets instead of 25, however my electronic prescribing was not working at that time.  Fortunately, I have successfully sent in Zoloft 50 mg for her now.  Thank you!  Patriciaann Clan, DO

## 2019-07-22 NOTE — Assessment & Plan Note (Addendum)
Concern for persistent (>3 days/wk) symptoms for the past several years despite PPI use consistently during this time.  While there is no additional red flags concerning for the presentation, do believe this warrants further evaluation by GI. Has never had an endoscopic evaluation.  Could also consider ulcer disease/dyspepsia secondary to H. pylori given concurrent epigastric discomfort, however cannot evaluate for this at this time given PPI use. Less likely due to NSAIDs as she is previously avoided these strictly up until the past few weeks. - Ambulatory referral to GI - Continue omeprazole 40 mg - Avoid triggers, sit upright after meals - Return precautions discussed, including concerning red flags as above

## 2019-07-22 NOTE — Telephone Encounter (Signed)
Patient calls nurse line wondering why her zoloft wasn't filled. Patient stated she is taking 50mg  now. Please advise.

## 2019-07-26 ENCOUNTER — Other Ambulatory Visit: Payer: Self-pay

## 2019-07-26 ENCOUNTER — Ambulatory Visit (INDEPENDENT_AMBULATORY_CARE_PROVIDER_SITE_OTHER): Payer: Self-pay | Admitting: Family Medicine

## 2019-07-26 ENCOUNTER — Encounter: Payer: Self-pay | Admitting: Family Medicine

## 2019-07-26 DIAGNOSIS — M940 Chondrocostal junction syndrome [Tietze]: Secondary | ICD-10-CM

## 2019-07-26 DIAGNOSIS — F411 Generalized anxiety disorder: Secondary | ICD-10-CM

## 2019-07-26 DIAGNOSIS — G44209 Tension-type headache, unspecified, not intractable: Secondary | ICD-10-CM

## 2019-07-26 NOTE — Patient Instructions (Signed)
Wonderful to see you today as always.  For anxiety, let us go ahead and increase your Zoloft to 100 mg daily, meaning you may take 2 tablets of your 50 mg.  Hold off on the BuSpar for now.  You may take your hydroxyzine as needed.  For your headache: Please try to work on your posture, and use a warm washcloth on your back, and make sure you have your boyfriend massage your back.  This should hopefully help with your headache, you may take Tylenol 650 mg every 4-6 hours as needed to help with relief.  For your rib pain: We will continue to monitor this, you may use ice as needed.  For now, we will try to avoid NSAIDs while we are awaiting further GI evaluation.  I would like to see you back around September 1st or sooner if needed.

## 2019-07-26 NOTE — Progress Notes (Signed)
Subjective:    Patient ID: Veronica [name], female    DOB: Feb 16, 1991, 28 y.o.   MRN: [id number]   CC: Headache/rib pain/anxiety  HPI: Veronica [name] is a 28 yo female presenting to discuss the following:   Headaches: Headaches since Saturday. Stopped a few times throughout the weekend. Pressure like a band across her forehead and sometimes bilateral temples. Upper back is tight. Some associated nausea, however denies any vomiting, photophobia, phonophobia, rhinorrhea, vision changes, watery eye, lightheadedness/dizziness, weakness, or numbness. No trauma. Has not been using a pain medication on a regular basis. Not a large coffee/caffiene drinker.    Rib pain: Similar to previous, hurts when she rotates her torso or moves "certain ways." Did meloxicam for 2 weeks, helped the first week but didn't help afterwards. Exercises kind of helped. Ice helps but comes back. Present since COVID due coughing fits and had flare up a week ago due to heavy lifting.  Anxiety: Continues to frequently have spells of anxiety daily. On Zoloft 50mg  for >1 week, tolerating well.   Due for repeat A1c and thyroid studies after 08/09/2019.    Smoking status reviewed  Review of Systems Per HPI   Patient Active Problem List   Diagnosis Date Noted  . Acute tension headache 07/27/2019  . Euthyroid sick syndrome 06/16/2019  . COVID-19 virus detected 05/19/2019  . Subclinical hypothyroidism 05/19/2019  . Pre-diabetes 05/12/2019  . GAD (generalized anxiety disorder) 05/09/2019  . Seasonal allergies 03/25/2018  . Costochondritis 01/25/2018  . GERD (gastroesophageal reflux disease) 03/28/2011  . Obesity (BMI 30-39.9) 02/27/2011  . Anemia 02/27/2011     Objective:  BP 132/80   Pulse 74   SpO2 99%  Vitals and nursing note reviewed  General: NAD, pleasant Cardiac: RRR, normal heart sounds, no murmurs. Quite tender to costochrondal joint on the left around ribs 3-5.  Respiratory: CTAB, normal effort Abdomen:  soft, nontender, nondistended Extremities: no edema or cyanosis. WWP. Skin: warm and dry, no rashes noted MSK/OMM: Full ROM of cervical and thoracic spine. Postural thoracic kyphosis noted. Tight bilateral trapezius, upper paraspinal, and posterior neck musculature. Symmetrical motion of rib cage with inhalation/exhalation. No protrusion of posterior rib angles.  Neuro: alert and oriented, no focal deficits Psych: normal affect  Assessment & Plan:   Acute tension headache Suspect tension type headache given distribution with associated tense paraspinal, trapezius, and posterior neck muscles. Postural thoracic kyphosis on exam. Less likely migraine without typical symptomology. Doubt medication overuse or caffeine withdrawal. --Performed OMM including cervical soft tissue and occipital release, obtained verbal consent prior to and tolerated well  --Educated on proper posture  --Heat on upper back PRN, encouraged massage  --Tylenol as needed   GAD (generalized anxiety disorder) Persistent, however much improved since onset. Will increase Zoloft to 100mg .  --Cont meeting with integrative care  --Zoloft inc'd to 100mg  --Cont hydroxyzine 25mg  PRN  --Encouraged using breathing/relaxation techniques and continuing exercise --Will add buspar if not improving with 100mg  zoloft and continue to have low threshold for psychiatry referral (limited by minimal insurance)    Costochondritis Unresolved, suspect from frequent coughing while with COVID in 05/2019 and re-exacerbation after heavy lifting recently. Still quite tender to costochrondal joint palpation. Reassured she has symmetrical motion of her chest and osteopathic rib exam unremarkable, suggesting against thoracic/rib somatic dysfunction. Hesitant to re-try NSAID trial given persistent GERD while on longterm PPI therapy.  --Continue UE exercises  --Ice/heat on region as needed  --Limit heavy lifting  --Tylenol PRN  Follow up in  approximately 2-3 weeks or sooner if needed.   [facility] Resident PGY-2

## 2019-07-27 ENCOUNTER — Encounter: Payer: Self-pay | Admitting: Family Medicine

## 2019-07-27 DIAGNOSIS — G44209 Tension-type headache, unspecified, not intractable: Secondary | ICD-10-CM | POA: Insufficient documentation

## 2019-07-27 HISTORY — DX: Tension-type headache, unspecified, not intractable: G44.209

## 2019-07-27 NOTE — Assessment & Plan Note (Addendum)
Persistent, however much improved since onset. Will increase Zoloft to 100mg .  --Cont meeting with integrative care  --Zoloft inc'd to 100mg  --Cont hydroxyzine 25mg  PRN  --Encouraged using breathing/relaxation techniques and continuing exercise --Will add buspar if not improving with 100mg  zoloft and continue to have low threshold for psychiatry referral (limited by minimal insurance)

## 2019-07-27 NOTE — Assessment & Plan Note (Addendum)
Suspect tension type headache given distribution with associated tense paraspinal, trapezius, and posterior neck muscles. Postural thoracic kyphosis on exam. Less likely migraine without typical symptomology. Doubt medication overuse or caffeine withdrawal. --Performed OMM including cervical soft tissue and occipital release, obtained verbal consent prior to and tolerated well  --Educated on proper posture  --Heat on upper back PRN, encouraged massage  --Tylenol as needed

## 2019-07-27 NOTE — Assessment & Plan Note (Signed)
Unresolved, suspect from frequent coughing while with COVID in 05/2019 and re-exacerbation after heavy lifting recently. Still quite tender to costochrondal joint palpation. Reassured she has symmetrical motion of her chest and osteopathic rib exam unremarkable, suggesting against thoracic/rib somatic dysfunction. Hesitant to re-try NSAID trial given persistent GERD while on longterm PPI therapy.  --Continue UE exercises  --Ice/heat on region as needed  --Limit heavy lifting  --Tylenol PRN

## 2019-07-29 ENCOUNTER — Other Ambulatory Visit: Payer: Self-pay

## 2019-07-29 ENCOUNTER — Ambulatory Visit (INDEPENDENT_AMBULATORY_CARE_PROVIDER_SITE_OTHER): Payer: Self-pay | Admitting: Licensed Clinical Social Worker

## 2019-07-29 DIAGNOSIS — Z7189 Other specified counseling: Secondary | ICD-10-CM

## 2019-07-29 DIAGNOSIS — F411 Generalized anxiety disorder: Secondary | ICD-10-CM

## 2019-07-29 NOTE — BH Specialist Note (Signed)
  Care Coordination  Clinical Social Work Follow Up Note 07/29/2019 Name: Veronica [name] MRN: [id number] DOB: 04-09-1991  Referred by: Patriciaann Clan, DO , Reason for referral : Care Coordination and Anxiety ,  Veronica [name] is a 28 y.o. year old female who is a primary care patient of Patriciaann Clan, DO.    Reason for follow-up: 5 out of 6 phone encounter completed with patient for ongoing assessment and brief interventions to assist with managing symptoms of anxiety and care coordination needs related to ongoing treatement.    Assessment:     Patient is pleasant excited and engaged in conversation she making progress towards goal.  States she feels great and has not had an anxiety flare up this week since starting the increase dose of zolfot . Patient has connected with therapy resources provided by LCSW and had her intake assessment yesterday with [facility].  Waiting for them to call her back with ongoing appointment day and time.  Patient has also reviewed patient education information provided by LCSW via [facility].   Interventions: . Solution-Focused Strategies,   . Problem-solving teaching/coping strategies and Psychoeducation   Plan:  1. Patient will F/U with setting therapy appointment with [facility] 2. Patient will continue with plan of previous intervention from 07/15/19 visit with LCSW 3. LCSW will has last phone appointment with patient next week as she transitions to her therapist.   Patriciaann Clan, DO has been notified of patient's progress and plan.  [name], LCSW Clinical Social Worker [place] / [place]   [phone] 3:28 PM

## 2019-08-05 ENCOUNTER — Other Ambulatory Visit: Payer: Self-pay

## 2019-08-05 ENCOUNTER — Ambulatory Visit: Payer: Self-pay | Admitting: Licensed Clinical Social Worker

## 2019-08-05 DIAGNOSIS — F411 Generalized anxiety disorder: Secondary | ICD-10-CM

## 2019-08-05 DIAGNOSIS — Z7189 Other specified counseling: Secondary | ICD-10-CM

## 2019-08-05 NOTE — BH Specialist Note (Signed)
  Care Coordination  Clinical Social Work Follow Up Note 08/05/2019 Name: Veronica [name] MRN: [id number] DOB: [birth year]  Referred by: Patriciaann Clan, DO , Reason for referral : Anxiety , Veronica [name] is a 27 y.o. year old female who is a primary care patient of Patriciaann Clan, DO.   Reason for follow-up: 6/6 phone encounter with patient today for assessment of progress and last brief interventions to assist with managing symptoms of anxiety.  Assessment: Patient experienced one anxiety fare in the past 8 days, which is  A significant decrease from previous weeks. Patient's fare ups seems to be exacerbated by her thoughts. Patient is making progress towards goal.  States overall she is feeling better.  Patient continues to take increase dose of Sertraline and reports no missed dose. Recommendation: Patient may benefit from, and is in agreement to continue to follow up with [facility] for her F/U appointment to continue CBT intervention.  Goal:  1. Continue to reduce symptoms of anxiety 2. Connect for ongoing therapy Interventions:Patient interviewed and appropriate assessments performed Provided mental health counseling with regard to reducing symptoms of anxiety (mental health diagnosis or concern) Provided patient with information about tips on positive thinking Advised patient to review patient education provided and F/U with [facility] Other interventions:Brief CBT,   Plan: 1. Patient will review patient information provided on [facility] "Tips on Positive Thinking"  2. LCSW will discontinue outreach calls to patient 3. Patient will call LCSW if needed.  Review of patient status, including review of consultants reports, relevant laboratory and other test results, and collaboration with appropriate care team members and the patient's provider was performed as part of comprehensive patient evaluation and provision of chronic care management  services.    Patriciaann Clan, DO has been notified of patient's recommendations and plan.  [name], LCSW Clinical Social Worker [place] / [place]   [phone] 10:47 AM

## 2019-08-09 ENCOUNTER — Other Ambulatory Visit: Payer: Self-pay

## 2019-08-09 DIAGNOSIS — F411 Generalized anxiety disorder: Secondary | ICD-10-CM

## 2019-08-09 MED ORDER — HYDROXYZINE HCL 25 MG PO TABS: 25.0000 mg | ORAL_TABLET | Freq: Four times a day (QID) | ORAL | 1 refills | Status: DC | PRN

## 2019-08-09 MED ORDER — SERTRALINE HCL 100 MG PO TABS: 100.0000 mg | ORAL_TABLET | Freq: Every day | ORAL | 0 refills | Status: DC

## 2019-08-13 ENCOUNTER — Emergency Department (HOSPITAL_COMMUNITY)
Admission: EM | Admit: 2019-08-13 | Discharge: 2019-08-13 | Disposition: A | Discharge status: Home / Self Care | Payer: Self-pay | Attending: Emergency Medicine | Admitting: Emergency Medicine

## 2019-08-13 ENCOUNTER — Emergency Department (HOSPITAL_COMMUNITY): Payer: Self-pay

## 2019-08-13 ENCOUNTER — Encounter (HOSPITAL_COMMUNITY): Payer: Self-pay | Admitting: Emergency Medicine

## 2019-08-13 ENCOUNTER — Other Ambulatory Visit: Payer: Self-pay

## 2019-08-13 DIAGNOSIS — Z79899 Other long term (current) drug therapy: Secondary | ICD-10-CM | POA: Insufficient documentation

## 2019-08-13 DIAGNOSIS — R0789 Other chest pain: Secondary | ICD-10-CM | POA: Insufficient documentation

## 2019-08-13 DIAGNOSIS — U071 COVID-19: Secondary | ICD-10-CM | POA: Insufficient documentation

## 2019-08-13 LAB — POC URINE PREG, ED: Preg Test, Ur: NEGATIVE

## 2019-08-13 MED ORDER — METHOCARBAMOL 500 MG PO TABS: 500.0000 mg | ORAL_TABLET | Freq: Two times a day (BID) | ORAL | 0 refills | Status: AC

## 2019-08-13 NOTE — ED Provider Notes (Signed)
[facility]   CSN: [id number] Arrival date & time: 08/13/19  1135     History   Chief Complaint No chief complaint on file.   HPI Veronica [name] is a 28 y.o. female with history of obesity, hiatal hernia, GERD, anemia presents today for 1763-month history of chest pain.  Patient reports that she was exercising approximately 3 months ago when she noticed a sharp sensation to her "front ribs".  Patient reports that pain lasted for a few days before improving.  She reports the pain then returned approximately 2 weeks later when she was twisting and reaching up.  She reports that pain lasted a short amount of time then.  She reports that the pain has returned anytime that she twists or moves her arms, a mild aching sensation without radiation, she reports that pain improves when she stops twisting or moving her arms.  She has not tried any medication prior to arrival.  She is requesting x-ray today for evaluation of rib fracture.  Patient denies fever/chills, cough/hemoptysis, shortness of breath, abdominal pain, nausea/vomiting, diarrhea, examined his hormone use, injury/immobilization, recent surgery, history of blood clot, swelling of the extremities, history of cancer or any additional concerns today.  Of note patient was diagnosed with the COVID-19 virus in June of this year.  She reports that she is recovered from this illness well and has no recurrent symptoms.    HPI  Past Medical History:  Diagnosis Date  . Abdominal pain 01/06/2012  . Anemia   . Ankle pain   . Birth control counseling 08/26/2011  . GERD (gastroesophageal reflux disease)   . Hiatal hernia   . Obesity (BMI 30-39.9)   . Secondary amenorrhea 03/25/2018    Patient Active Problem List   Diagnosis Date Noted  . Acute tension headache 07/27/2019  . Euthyroid sick syndrome 06/16/2019  . COVID-19 virus detected 05/19/2019  . Subclinical hypothyroidism 05/19/2019  .  Pre-diabetes 05/12/2019  . GAD (generalized anxiety disorder) 05/09/2019  . Seasonal allergies 03/25/2018  . Costochondritis 01/25/2018  . GERD (gastroesophageal reflux disease) 03/28/2011  . Obesity (BMI 30-39.9) 02/27/2011  . Anemia 02/27/2011    Past Surgical History:  Procedure Laterality Date  . CESAREAN SECTION  09/02/2010   For macrosomia, but son was 7 lb 12 oz  . TONSILLECTOMY AND ADENOIDECTOMY       OB History   No obstetric history on file.      Home Medications    Prior to Admission medications   Medication Sig Start Date End Date Taking? Authorizing Provider  albuterol (PROVENTIL HFA;VENTOLIN HFA) 108 (90 Base) MCG/ACT inhaler Inhale 2 puffs into the lungs every 4 (four) hours as needed for wheezing or shortness of breath (cough). 10/09/17   Street, [name], [name]  benzonatate (TESSALON) 100 MG capsule Take 1 capsule (100 mg total) by mouth 3 (three) times daily as needed for cough. 05/23/19   Fawze, Mina A, [name]  busPIRone (BUSPAR) 5 MG tablet Take 1 tablet (5 mg total) by mouth 2 (two) times daily. 06/16/19   Mullis, Kiersten P, DO  esomeprazole (NEXIUM) 20 MG capsule Take 20 mg by mouth daily as needed (acid reflux). Over the counter    [provider]  fluticasone (FLONASE) 50 MCG/ACT nasal spray Place 2 sprays into both nostrils daily. Patient taking differently: Place 2 sprays into both nostrils daily as needed for allergies.  03/25/18   Berton BonMikell, Asiyah Zahra, MD  hydrOXYzine (ATARAX/VISTARIL) 25 MG tablet  Take 1 tablet (25 mg total) by mouth every 6 (six) hours as needed for anxiety. 08/09/19   Allayne Stack, DO  Melatonin 5 MG TABS Take 1 tablet (5 mg total) by mouth Nightly. 2-3 hours prior to bedtime 06/07/19   Allayne Stack, DO  methocarbamol (ROBAXIN) 500 MG tablet Take 1 tablet (500 mg total) by mouth 2 (two) times daily for 7 days. 08/13/19 08/20/19  Harlene Salts A, [name]  ondansetron (ZOFRAN) 4 MG tablet Take 1 tablet (4 mg total) by mouth every 8  (eight) hours as needed for nausea or vomiting. 05/23/19   Luevenia Maxin, Mina A, [name]  potassium chloride SA (K-DUR,KLOR-CON) 20 MEQ tablet Take 1 tablet (20 mEq total) 2 (two) times daily by mouth. Patient not taking: Reported on 08/25/2018 10/19/17   Roxy Horseman, [name]  Pseudoeph-Doxylamine-DM-APAP (NYQUIL PO) Take 30 mLs by mouth at bedtime as needed (sleep).    [provider]  sertraline (ZOLOFT) 100 MG tablet Take 1 tablet (100 mg total) by mouth daily. 08/09/19   Allayne Stack, DO  sodium chloride (OCEAN) 0.65 % SOLN nasal spray Place 1 spray into both nostrils as needed for congestion. 07/19/19   Allayne Stack, DO  famotidine (PEPCID) 20 MG tablet Take 1 tablet (20 mg total) by mouth 2 (two) times daily. Patient not taking: Reported on 08/25/2018 04/09/18 05/24/19  Berton Bon, MD    Family History Family History  Problem Relation Age of Onset  . Diabetes Mother   . Hypertension Mother   . Obesity Mother   . Heart attack Mother 44       Death  . Obesity Sister   . Osteochondroma Sister   . Diabetes Sister   . Hypertension Sister     Social History Social History   Tobacco Use  . Smoking status: Never Smoker  . Smokeless tobacco: Never Used  Substance Use Topics  . Alcohol use: No  . Drug use: No     Allergies   Morphine and related and Motrin [ibuprofen]   Review of Systems Review of Systems Ten systems are reviewed and are negative for acute change except as noted in the HPI  Physical Exam Updated Vital Signs BP (!) 144/73 (BP Location: Right Arm)   Pulse 95   Temp 99.2 F (37.3 C) (Oral)   Resp 18   LMP 07/18/2019   SpO2 97%   Physical Exam Constitutional:      General: She is not in acute distress.    Appearance: Normal appearance. She is well-developed. She is not ill-appearing or diaphoretic.  HENT:     Head: Normocephalic and atraumatic.     Right Ear: External ear normal.     Left Ear: External ear normal.     Nose: Nose  normal.  Eyes:     General: Vision grossly intact. Gaze aligned appropriately.     Pupils: Pupils are equal, round, and reactive to light.  Neck:     Musculoskeletal: Normal range of motion.     Trachea: Trachea and phonation normal. No tracheal deviation.  Cardiovascular:     Rate and Rhythm: Normal rate and regular rhythm.     Pulses: Normal pulses.          Dorsalis pedis pulses are 2+ on the right side and 2+ on the left side.     Heart sounds: Normal heart sounds.  Pulmonary:     Effort: Pulmonary effort is normal. No accessory muscle usage or respiratory  distress.     Breath sounds: Normal breath sounds and air entry.  Chest:     Chest wall: Tenderness present. No deformity or crepitus.     Comments: Consistently reproducible tenderness of the chest wall including with movement of the arms. Abdominal:     General: There is no distension.     Palpations: Abdomen is soft. There is no pulsatile mass.     Tenderness: There is no abdominal tenderness. There is no guarding or rebound.  Musculoskeletal: Normal range of motion.     Right lower leg: She exhibits no tenderness. No edema.     Left lower leg: She exhibits no tenderness. No edema.  Skin:    General: Skin is warm and dry.  Neurological:     Mental Status: She is alert.     GCS: GCS eye subscore is 4. GCS verbal subscore is 5. GCS motor subscore is 6.     Comments: Speech is clear and goal oriented, follows commands Major Cranial nerves without deficit, no facial droop Moves extremities without ataxia, coordination intact  Psychiatric:        Behavior: Behavior normal.      ED Treatments / Results  Labs (all labs ordered are listed, but only abnormal results are displayed) Labs Reviewed  POC URINE PREG, ED    EKG EKG Interpretation  Date/Time:  Saturday August 13 2019 12:58:53 EDT Ventricular Rate:  83 PR Interval:    QRS Duration: 87 QT Interval:  366 QTC Calculation: 430 R Axis:   37 Text  Interpretation:  Sinus rhythm Normal ECG Confirmed by Carmin Muskrat [phone]) on 08/13/2019 1:06:39 PM   Radiology Dg Chest 2 View  Result Date: 08/13/2019 CLINICAL DATA:  Chest pain. EXAM: CHEST - 2 VIEW COMPARISON:  05/27/2019 FINDINGS: The heart size and mediastinal contours are within normal limits. Both lungs are clear. The visualized skeletal structures are unremarkable. IMPRESSION: No active cardiopulmonary disease. Electronically Signed   By: Kerby Moors M.D.   On: 08/13/2019 12:35    Procedures Procedures (including critical care time)  Medications Ordered in ED Medications - No data to display   Initial Impression / Assessment and Plan / ED Course  I have reviewed the triage vital signs and the nursing notes.  Pertinent labs & imaging results that were available during my care of the patient were reviewed by me and considered in my medical decision making (see chart for details).     On initial evaluation patient is overall very well-appearing and in no acute distress.  Heart regular rate and rhythm without murmur, lungs clear to auscultation bilaterally, abdomen soft nontender without peritoneal signs, extremities neurovascular intact without sign of DVT.  Patient low risk by Wells criteria and PERC negative.  She has reproducible muscular tenderness of the chest wall with arm movement and palpation.  Suspect patient with musculoskeletal wall pain today.  Will obtain chest x-ray and screening EKG, anticipate discharge. - Urine pregnancy test negative  EKG: Sinus rhythm Normal ECG Confirmed by Carmin Muskrat [phone]) on 08/13/2019 1:06:39 PM  CXR:  IMPRESSION:  No active cardiopulmonary disease.  - Reassuring work-up as above.  Based on history, physical and results today do not suspect ACS, dissection, PE or other acute cardiopulmonary processes as etiology of patient's pain.  Additionally do not suspect obstruction, cholecystitis, hernia or other acute abdominal pelvic  etiology of patient's pain today.  Will begin patient on Robaxin for likely musculoskeletal chest wall pain as well as naproxen.  Patient denies any history of CKD or gastric ulcers.  Discussed precautions regarding NSAIDs as well as muscle relaxer medications with the patient and she states understanding.  At this time there does not appear to be any evidence of an acute emergency medical condition and the patient appears stable for discharge with appropriate outpatient follow up. Diagnosis was discussed with patient who verbalizes understanding of care plan and is agreeable to discharge. I have discussed return precautions with patient who verbalizes understanding of return precautions. Patient encouraged to follow-up with their PCP. All questions answered.  Patient has been discharged in good condition.  Patient's case discussed with Dr. Jeraldine LootsLockwood who agrees with plan to discharge with robaxin and PCP follow-up.   Veronica LaughterKimberly R [name] was evaluated in Emergency Department on 08/13/2019 for the symptoms described in the history of present illness. She was evaluated in the context of the global COVID-19 pandemic, which necessitated consideration that the patient might be at risk for infection with the SARS-CoV-2 virus that causes COVID-19. Institutional protocols and algorithms that pertain to the evaluation of patients at risk for COVID-19 are in a state of rapid change based on information released by regulatory bodies including the CDC and federal and state organizations. These policies and algorithms were followed during the patient's care in the ED.   Note: Portions of this report may have been transcribed using voice recognition software. Every effort was made to ensure accuracy; however, inadvertent computerized transcription errors may still be present. Final Clinical Impressions(s) / ED Diagnoses   Final diagnoses:  Musculoskeletal chest pain    ED Discharge Orders         Ordered     methocarbamol (ROBAXIN) 500 MG tablet  2 times daily     08/13/19 1335           Elizabeth PalauMorelli, Giulliana Mcroberts A, [name] 08/13/19 1336    Gerhard MunchLockwood, Robert, MD 08/15/19 1416

## 2019-08-13 NOTE — Discharge Instructions (Addendum)
You have been diagnosed today with musculoskeletal chest wall pain.  At this time there does not appear to be the presence of an emergent medical condition, however there is always the potential for conditions to change. Please read and follow the below instructions.  Please return to the Emergency Department immediately for any new or worsening symptoms. Please be sure to follow up with your Primary Care Provider within one week regarding your visit today; please call their office to schedule an appointment even if you are feeling better for a follow-up visit. You may use the muscle relaxer Robaxin as prescribed to help with your symptoms.  Do not drive or operate heavy machinery while taking Robaxin as it will make you drowsy.  Do not drink alcohol or take other sedating medications while taking Robaxin as this will worsen side effects. You may use over-the-counter anti-inflammatory medication such as Tylenol and ibuprofen as directed on the packaging to help with your symptoms.  Get help right away if: You feel sick to your stomach (nauseous) or you throw up (vomit). You feel sweaty or light-headed. You have a cough with mucus from your lungs (sputum) or you cough up blood. You are short of breath. You have fever/chills You have new or worsening chest pain You have any new/concerning or worsening symptoms  Please read the additional information packets attached to your discharge summary.  Do not take your medicine if  develop an itchy rash, swelling in your mouth or lips, or difficulty breathing; call 911 and seek immediate emergency medical attention if this occurs.

## 2019-08-13 NOTE — ED Triage Notes (Signed)
Pt reports central chest pain, tender on palpation, states x2 months after working out. NAD. Denies illness.

## 2019-08-13 NOTE — ED Notes (Signed)
Patient verbalizes understanding of discharge instructions . Opportunity for questions and answers were provided . Armband removed by staff ,Pt discharged from ED. W/C  offered at D/C  and Declined W/C at D/C and was escorted to lobby by RN.  

## 2019-08-19 ENCOUNTER — Other Ambulatory Visit: Payer: Self-pay

## 2019-08-19 ENCOUNTER — Encounter: Payer: Self-pay | Admitting: Family Medicine

## 2019-08-19 ENCOUNTER — Ambulatory Visit (INDEPENDENT_AMBULATORY_CARE_PROVIDER_SITE_OTHER): Payer: Self-pay | Admitting: Family Medicine

## 2019-08-19 VITALS — BP 130/80 | HR 86 | Wt 255.2 lb

## 2019-08-19 DIAGNOSIS — F411 Generalized anxiety disorder: Secondary | ICD-10-CM

## 2019-08-19 DIAGNOSIS — O99210 Obesity complicating pregnancy, unspecified trimester: Secondary | ICD-10-CM | POA: Insufficient documentation

## 2019-08-19 DIAGNOSIS — E039 Hypothyroidism, unspecified: Secondary | ICD-10-CM

## 2019-08-19 DIAGNOSIS — M940 Chondrocostal junction syndrome [Tietze]: Secondary | ICD-10-CM

## 2019-08-19 DIAGNOSIS — E038 Other specified hypothyroidism: Secondary | ICD-10-CM

## 2019-08-19 DIAGNOSIS — Z6841 Body Mass Index (BMI) 40.0 and over, adult: Secondary | ICD-10-CM

## 2019-08-19 DIAGNOSIS — K219 Gastro-esophageal reflux disease without esophagitis: Secondary | ICD-10-CM

## 2019-08-19 DIAGNOSIS — R7303 Prediabetes: Secondary | ICD-10-CM

## 2019-08-19 LAB — POCT GLYCOSYLATED HEMOGLOBIN (HGB A1C): Hemoglobin A1C: 5.5 % (ref 4.0–5.6)

## 2019-08-19 NOTE — Assessment & Plan Note (Signed)
Continues to have breakthrough symptoms with omeprazole 40 mg on board.  Discussed using Tums as needed, can consider transition of PPI vs adding H2 blocker if this is not effective for patient.  Awaiting GI evaluation, patient is self pay [facility] family-planning only).

## 2019-08-19 NOTE — Assessment & Plan Note (Signed)
Due for repeat TSH in approximately 3 months.

## 2019-08-19 NOTE — Assessment & Plan Note (Signed)
Improving, Steveson flare on 9/5. Noted significant improvement with Robaxin, may continue as needed.  Additionally recommended ice/heat on region.  We will continue to avoid NSAIDs given persistent GERD while on long-term PPI therapy.  Follow-up if reoccurs.

## 2019-08-19 NOTE — Progress Notes (Signed)
   Subjective:    Patient ID: Veronica [name], female    DOB: 09/18/1991, 28 y.o.   MRN: [id number]   CC: F/u pre-diabetes   HPI: Veronica [name] is a 28 year old female with a history of prediabetes, elevated BMI, and anxiety presenting discuss the following:  Prediabetes/elevated BMI: A1c 5.7 in 05/09/2019.  She has been extremely diligent about exercise and monitoring her diet.  Down almost 50 pounds since June/2020.  She has been exercising 5 days a week, 1 to 2 hours a day.  Working on weight lifting and cardio through walking and the stairmaster.  She is also been avoiding sweets and adding more vegetables and water into her diet.  Anxiety: Has been stable on Zoloft 100 mg daily for the last several weeks.  Feels like this is well managed currently.  On occasion will feel slightly anxious, will do deep breathing and this will get better.   GERD: Continues to have breakthrough symptoms despite omeprazole twice daily. Awaiting to see GI.   Rib pain: Recently went to the ED due to a flareup of her rib pain.  EKG performed at that time and unremarkable.  Given Robaxin and naproxen to help with pain.  Robaxin has been effective in helping, not using naproxen.  States she is no longer having rib pain as of now.  Smoking status reviewed  Review of Systems Per HPI   Objective:  BP 130/80   Pulse 86   Wt 255 lb 3.2 oz (115.8 kg)   SpO2 96%   BMI 46.68 kg/m  Vitals and nursing note reviewed  General: NAD, pleasant Cardiac: RRR, normal heart sounds, no murmurs, minimal tenderness to palpation of left sided anterior costochondral joints Respiratory: CTAB, normal effort Abdomen: soft, nontender, nondistended Extremities: no edema or cyanosis. WWP. Skin: warm and dry, no rashes noted Neuro: alert and oriented, no focal deficits Psych: normal affect  Assessment & Plan:    Pre-diabetes A1c 5.5 today, in normal range now from 5.7 in 05/2019.  Congratulated patient on her weight loss with  continued exercise and dietary modifications, encouraged her to keep this up. - Recheck A1c in 6 months - Continue lifestyle modifications as above   BMI 45.0-49.9, adult (HCC) Down 50 pounds since 05/2019. - Continue lifestyle modifications as discussed above - Screening lipid panel  GAD (generalized anxiety disorder) Well-controlled. -Continue Zoloft 100 mg daily - Continue deep breathing exercises, calming techniques  Costochondritis Improving, Vassel flare on 9/5. Noted significant improvement with Robaxin, may continue as needed.  Additionally recommended ice/heat on region.  We will continue to avoid NSAIDs given persistent GERD while on long-term PPI therapy.  Follow-up if reoccurs.  GERD (gastroesophageal reflux disease) Continues to have breakthrough symptoms with omeprazole 40 mg on board.  Discussed using Tums as needed, can consider transition of PPI vs adding H2 blocker if this is not effective for patient.  Awaiting GI evaluation, patient is self pay [facility] family-planning only).   Subclinical hypothyroidism Due for repeat TSH in approximately 3 months.   Additionally discussed considering applying for orange card given that the patient only has Medicaid family-planning.  May not be able to qualify, however would be beneficial to try for additional med assistance/specialty care.  Patient to pick up packet at the front office when she leaves today.  Follow-up in approximately 3 months for anxiety/BMI/repeat TSH or sooner if needed.   [facility] Resident PGY-2

## 2019-08-19 NOTE — Assessment & Plan Note (Signed)
Well-controlled. -Continue Zoloft 100 mg daily - Continue deep breathing exercises, calming techniques

## 2019-08-19 NOTE — Patient Instructions (Signed)
It is wonderful to see you as always.  Keep up the amazing work with your exercise and a diet.   We have obtained a lipid panel today to look at your cholesterol, I will let you know these results within the next few days.  Additionally for your reflux, you may take Tums (calcium carbonate) as needed to help with breakthrough symptoms.  Please also make sure that you are monitoring your posture and using a heating pad as needed on your neck.   Please also ask at the front desk for orange card paperwork to see if you can qualify for this.  This would help defer additional assistance on medications and specialist.

## 2019-08-19 NOTE — Assessment & Plan Note (Addendum)
A1c 5.5 today, in normal range now from 5.7 in 05/2019.  Congratulated patient on her weight loss with continued exercise and dietary modifications, encouraged her to keep this up. - Recheck A1c in 6 months - Continue lifestyle modifications as above

## 2019-08-19 NOTE — Assessment & Plan Note (Signed)
Down 50 pounds since 05/2019. - Continue lifestyle modifications as discussed above - Screening lipid panel

## 2019-08-20 LAB — LIPID PANEL
Chol/HDL Ratio: 3.5 ratio (ref 0.0–4.4)
Cholesterol, Total: 131 mg/dL (ref 100–199)
HDL: 37 mg/dL — ABNORMAL LOW (ref >39)
LDL Chol Calc (NIH): 80 mg/dL (ref 0–99)
Triglycerides: 69 mg/dL (ref 0–149)
VLDL Cholesterol Cal: 14 mg/dL (ref 5–40)

## 2019-08-22 ENCOUNTER — Encounter: Payer: Self-pay | Admitting: Family Medicine

## 2019-08-23 ENCOUNTER — Other Ambulatory Visit: Payer: Self-pay

## 2019-08-23 ENCOUNTER — Emergency Department (HOSPITAL_COMMUNITY)
Admission: EM | Admit: 2019-08-23 | Discharge: 2019-08-23 | Disposition: A | Discharge status: Home / Self Care | Payer: Medicaid Other | Attending: Emergency Medicine | Admitting: Emergency Medicine

## 2019-08-23 ENCOUNTER — Emergency Department (HOSPITAL_COMMUNITY): Payer: Medicaid Other

## 2019-08-23 DIAGNOSIS — R059 Cough, unspecified: Secondary | ICD-10-CM

## 2019-08-23 DIAGNOSIS — R05 Cough: Secondary | ICD-10-CM | POA: Insufficient documentation

## 2019-08-23 DIAGNOSIS — R0602 Shortness of breath: Secondary | ICD-10-CM

## 2019-08-23 DIAGNOSIS — Z8709 Personal history of other diseases of the respiratory system: Secondary | ICD-10-CM | POA: Insufficient documentation

## 2019-08-23 NOTE — ED Notes (Signed)
Patient verbalized understanding of discharge instructions and denies any further needs or questions at this time. VS stable. Patient ambulatory with steady gait.  

## 2019-08-23 NOTE — ED Triage Notes (Signed)
Patient reports shortness of breath as well as anxiety. States she feels short of breath with her anxiety attacks, but has had new shortness of breath for a while, which in turn makes her feel more anxious. Was here two weeks ago for chest wall pain and given muscle relaxers with relief. Denies chest pain at this time. Denies new cough or fevers/chills. Resp e/u. Was covid positive in June and has since tested negative.

## 2019-08-23 NOTE — Discharge Instructions (Signed)
Please read attached information. If you experience any new or worsening signs or symptoms please return to the emergency room for evaluation. Please follow-up with your primary care provider or specialist as discussed.  °

## 2019-08-23 NOTE — ED Provider Notes (Signed)
[facility] EMERGENCY DEPARTMENT Provider Note   CSN: [id number] Arrival date & time: 08/23/19  1059    History   Chief Complaint Chief Complaint  Patient presents with  . Shortness of Breath    HPI Veronica [name] is a 28 y.o. female.     HPI   28 year old female presents today with complaints of shortness of breath.  Patient notes associated cough, she denies any fever rhinorrhea nasal congestion.  He does note a history of anxiety.  Patient notes she did have COVID earlier in the summer but had subsequent negative testing.  No lower extremity swelling or edema, no history DVT or PE.  She does not smoke.  Past Medical History:  Diagnosis Date  . Abdominal pain 01/06/2012  . Anemia   . Ankle pain   . Birth control counseling 08/26/2011  . GERD (gastroesophageal reflux disease)   . Hiatal hernia   . Obesity (BMI 30-39.9)   . Secondary amenorrhea 03/25/2018    Patient Active Problem List   Diagnosis Date Noted  . BMI 45.0-49.9, adult (HCC) 08/19/2019  . Acute tension headache 07/27/2019  . Euthyroid sick syndrome 06/16/2019  . COVID-19 virus detected 05/19/2019  . Subclinical hypothyroidism 05/19/2019  . Pre-diabetes 05/12/2019  . GAD (generalized anxiety disorder) 05/09/2019  . Seasonal allergies 03/25/2018  . Costochondritis 01/25/2018  . GERD (gastroesophageal reflux disease) 03/28/2011  . Obesity (BMI 30-39.9) 02/27/2011  . Anemia 02/27/2011    Past Surgical History:  Procedure Laterality Date  . CESAREAN SECTION  09/02/2010   For macrosomia, but son was 7 lb 12 oz  . TONSILLECTOMY AND ADENOIDECTOMY       OB History   No obstetric history on file.      Home Medications    Prior to Admission medications   Medication Sig Start Date End Date Taking? Authorizing Provider  albuterol (PROVENTIL HFA;VENTOLIN HFA) 108 (90 Base) MCG/ACT inhaler Inhale 2 puffs into the lungs every 4 (four) hours as needed for wheezing or shortness of breath  (cough). 10/09/17   Street, [name], [name]  benzonatate (TESSALON) 100 MG capsule Take 1 capsule (100 mg total) by mouth 3 (three) times daily as needed for cough. 05/23/19   Fawze, Mina A, [name]  busPIRone (BUSPAR) 5 MG tablet Take 1 tablet (5 mg total) by mouth 2 (two) times daily. 06/16/19   Mullis, Kiersten P, DO  esomeprazole (NEXIUM) 20 MG capsule Take 20 mg by mouth daily as needed (acid reflux). Over the counter    [provider]  fluticasone (FLONASE) 50 MCG/ACT nasal spray Place 2 sprays into both nostrils daily. Patient taking differently: Place 2 sprays into both nostrils daily as needed for allergies.  03/25/18   Mikell, Antionette Poles, MD  hydrOXYzine (ATARAX/VISTARIL) 25 MG tablet Take 1 tablet (25 mg total) by mouth every 6 (six) hours as needed for anxiety. 08/09/19   Allayne Stack, DO  Melatonin 5 MG TABS Take 1 tablet (5 mg total) by mouth Nightly. 2-3 hours prior to bedtime 06/07/19   Leticia Penna N, DO  ondansetron (ZOFRAN) 4 MG tablet Take 1 tablet (4 mg total) by mouth every 8 (eight) hours as needed for nausea or vomiting. 05/23/19   Michela Pitcher A, [name]  potassium chloride SA (K-DUR,KLOR-CON) 20 MEQ tablet Take 1 tablet (20 mEq total) 2 (two) times daily by mouth. Patient not taking: Reported on 08/25/2018 10/19/17   Roxy Horseman, [name]  Pseudoeph-Doxylamine-DM-APAP (NYQUIL PO) Take 30 mLs by mouth at  bedtime as needed (sleep).    [provider]  sertraline (ZOLOFT) 100 MG tablet Take 1 tablet (100 mg total) by mouth daily. 08/09/19   Patriciaann Clan, DO  sodium chloride (OCEAN) 0.65 % SOLN nasal spray Place 1 spray into both nostrils as needed for congestion. 07/19/19   Patriciaann Clan, DO  famotidine (PEPCID) 20 MG tablet Take 1 tablet (20 mg total) by mouth 2 (two) times daily. Patient not taking: Reported on 08/25/2018 04/09/18 05/24/19  Tonette Bihari, MD    Family History Family History  Problem Relation Age of Onset  . Diabetes Mother   .  Hypertension Mother   . Obesity Mother   . Heart attack Mother 17       Death  . Obesity Sister   . Osteochondroma Sister   . Diabetes Sister   . Hypertension Sister     Social History Social History   Tobacco Use  . Smoking status: Never Smoker  . Smokeless tobacco: Never Used  Substance Use Topics  . Alcohol use: No  . Drug use: No     Allergies   Morphine and related and Motrin [ibuprofen]   Review of Systems Review of Systems  All other systems reviewed and are negative.    Physical Exam Updated Vital Signs BP 112/66 (BP Location: Right Arm)   Pulse 91   Temp 99 F (37.2 C) (Oral)   Resp 15   LMP 08/19/2019 (Exact Date)   SpO2 100%   Physical Exam Vitals signs and nursing note reviewed.  Constitutional:      Appearance: She is well-developed.  HENT:     Head: Normocephalic and atraumatic.  Eyes:     General: No scleral icterus.       Right eye: No discharge.        Left eye: No discharge.     Conjunctiva/sclera: Conjunctivae normal.     Pupils: Pupils are equal, round, and reactive to light.  Neck:     Musculoskeletal: Normal range of motion.     Vascular: No JVD.     Trachea: No tracheal deviation.  Cardiovascular:     Rate and Rhythm: Normal rate and regular rhythm.  Pulmonary:     Effort: Pulmonary effort is normal. No respiratory distress.     Breath sounds: Normal breath sounds. No stridor. No wheezing, rhonchi or rales.  Musculoskeletal:     Comments: No edema to lower extremity   Neurological:     Mental Status: She is alert and oriented to person, place, and time.     Coordination: Coordination normal.  Psychiatric:        Behavior: Behavior normal.        Thought Content: Thought content normal.        Judgment: Judgment normal.      ED Treatments / Results  Labs (all labs ordered are listed, but only abnormal results are displayed) Labs Reviewed - No data to display  EKG None  Radiology Dg Chest 2 View  Result Date:  08/23/2019 CLINICAL DATA:  Patient reports shortness of breath as well as anxiety. States she feels short of breath with her anxiety attacks, but has had new shortness of breath for a while, which in turn makes her feel more anxious. EXAM: CHEST - 2 VIEW COMPARISON:  Chest radiograph 08/13/2019, 05/27/2019 FINDINGS: The heart size and mediastinal contours are within normal limits. The lungs are clear. No pneumothorax or pleural effusion. The visualized skeletal structures are  unremarkable. IMPRESSION: No active cardiopulmonary disease. Electronically Signed   By: Emmaline KluverNancy  Ballantyne M.D.   On: 08/23/2019 11:35    Procedures Procedures (including critical care time)  Medications Ordered in ED Medications - No data to display   Initial Impression / Assessment and Plan / ED Course  I have reviewed the triage vital signs and the nursing notes.  Pertinent labs & imaging results that were available during my care of the patient were reviewed by me and considered in my medical decision making (see chart for details).          28 year old female presents today with cough and shortness of breath.  She has no signs of significant infectious etiology.  I have low suspicion for PE dissection, pneumonia or any other life-threatening etiology.  Patient's vital signs originally were 112, I placed the monitor on her myself, she had under percent oxygen saturation with heart rate in the 80s.  She has had work-up for PE previously as she has recurring visits for shortness of breath.  She is PERC negative.  Wells 0.  Discharged with return precautions and outpatient follow-up.  She verbalized understanding and agreement today's plan had no further questions concerns the time discharge.    Final Clinical Impressions(s) / ED Diagnoses   Final diagnoses:  SOB (shortness of breath)  Cough    ED Discharge Orders    None       Rosalio LoudHedges, Shariff Lasky, [name] 08/24/19 1036    Charlynne PanderYao, David Hsienta, MD 08/28/19 [phone]

## 2019-08-23 NOTE — ED Notes (Signed)
Patient transported to x-ray. ?

## 2019-08-24 ENCOUNTER — Ambulatory Visit (INDEPENDENT_AMBULATORY_CARE_PROVIDER_SITE_OTHER): Payer: Self-pay | Admitting: Family Medicine

## 2019-08-24 ENCOUNTER — Other Ambulatory Visit: Payer: Self-pay

## 2019-08-24 DIAGNOSIS — R062 Wheezing: Secondary | ICD-10-CM

## 2019-08-24 MED ORDER — ALBUTEROL SULFATE HFA 108 (90 BASE) MCG/ACT IN AERS: 2.0000 | INHALATION_SPRAY | RESPIRATORY_TRACT | 0 refills | Status: DC | PRN

## 2019-08-24 NOTE — Patient Instructions (Signed)
It was great seeing you today!  I think your wheezing and shortness of breath at night are likely related to inflammation of the small portions of your lungs called the bronchioles.  Whether this is due to a viral upper respiratory infection, allergies, or true asthma is uncertain.  I will give you a trial of an albuterol inhaler.  He complains of the pharmacy know she had to use this properly.  We are having shortness of breath and the wheezing use this and see if it works.  If it does work that is great.  If it does not work or you have any side effects in the albuterol please let us know.

## 2019-08-27 ENCOUNTER — Encounter: Payer: Self-pay | Admitting: Family Medicine

## 2019-08-27 DIAGNOSIS — R062 Wheezing: Secondary | ICD-10-CM | POA: Insufficient documentation

## 2019-08-27 NOTE — Progress Notes (Signed)
   HPI 28 year old female who presents for wheezing and mild shortness of breath at night.  Patient states that this is been going on for a few weeks.  She has no shortness of breath with anxiety and panic attacks but this is "very different" than those episodes.  Patient states that she does have some smoke exposure in the form of her boyfriend and a few months ago moved into an apartment that has carpet.  She has never lived in a house or apartment with carpet in it.  She describes mild wheezing at night.  She states that she does not wake up gasping for air, snore loudly, or have any other symptoms of typical obstructive disease.  CC: Wheezing, shortness of breath at night   ROS:   Review of Systems See HPI for ROS.   CC, SH/smoking status, and VS noted  Objective: BP 118/70   Pulse 86   Wt 248 lb 12.8 oz (112.9 kg)   LMP 08/19/2019 (Exact Date)   SpO2 99%   BMI 45.51 kg/m  Gen: 28 year old African-American female, no acute distress, resting comfortably HEENT: Moist mucous membranes, EOMI, PERRLA.  No palpable lymphadenopathy CV: Regular rate, regular rhythm.  No M/R/G Resp: Lungs clear to auscultation bilaterally, no accessory muscle use Neuro: Alert and oriented, Speech clear, No gross deficits   Assessment and plan:  Wheezing Patient describes mild wheezing and mild shortness of breath at nights.  Could potentially be due to bronchial inflammation secondary to smoke exposure and potential allergens in the carpet.  Patient could also be suffering from a mild upper respiratory infection.  Could also be seasonal allergies playing a role as well.  Will give albuterol as needed to use if wheezing.  Patient is going to see how this works for her.  If the albuterol significantly improves her symptoms could consider getting PFTs.  Could also consider getting a sleep study given her body habitus, but OSA seems less likely given her history.   No orders of the defined types were placed  in this encounter.   Meds ordered this encounter  Medications  . albuterol (VENTOLIN HFA) 108 (90 Base) MCG/ACT inhaler    Sig: Inhale 2 puffs into the lungs every 4 (four) hours as needed for wheezing or shortness of breath (cough).    Dispense:  18 g    Refill:  0     [name] MD PGY-3 Family Medicine Resident  08/27/2019 11:25 PM

## 2019-08-27 NOTE — Assessment & Plan Note (Signed)
Patient describes mild wheezing and mild shortness of breath at nights.  Could potentially be due to bronchial inflammation secondary to smoke exposure and potential allergens in the carpet.  Patient could also be suffering from a mild upper respiratory infection.  Could also be seasonal allergies playing a role as well.  Will give albuterol as needed to use if wheezing.  Patient is going to see how this works for her.  If the albuterol significantly improves her symptoms could consider getting PFTs.  Could also consider getting a sleep study given her body habitus, but OSA seems less likely given her history.

## 2019-09-09 ENCOUNTER — Other Ambulatory Visit: Payer: Self-pay

## 2019-09-09 DIAGNOSIS — F411 Generalized anxiety disorder: Secondary | ICD-10-CM

## 2019-09-09 MED ORDER — ALBUTEROL SULFATE HFA 108 (90 BASE) MCG/ACT IN AERS: 2.0000 | INHALATION_SPRAY | RESPIRATORY_TRACT | 0 refills | Status: DC | PRN

## 2019-09-09 MED ORDER — HYDROXYZINE HCL 25 MG PO TABS: 25.0000 mg | ORAL_TABLET | Freq: Four times a day (QID) | ORAL | 1 refills | Status: DC | PRN

## 2019-09-13 ENCOUNTER — Ambulatory Visit (INDEPENDENT_AMBULATORY_CARE_PROVIDER_SITE_OTHER): Payer: Self-pay | Admitting: Family Medicine

## 2019-09-13 ENCOUNTER — Other Ambulatory Visit: Payer: Self-pay

## 2019-09-13 VITALS — BP 112/64 | HR 69 | Wt 244.2 lb

## 2019-09-13 DIAGNOSIS — K219 Gastro-esophageal reflux disease without esophagitis: Secondary | ICD-10-CM

## 2019-09-13 DIAGNOSIS — R1013 Epigastric pain: Secondary | ICD-10-CM

## 2019-09-13 HISTORY — DX: Epigastric pain: R10.13

## 2019-09-13 MED ORDER — PANTOPRAZOLE SODIUM 40 MG PO TBEC: 40.0000 mg | DELAYED_RELEASE_TABLET | Freq: Every day | ORAL | 0 refills | Status: DC

## 2019-09-13 NOTE — Patient Instructions (Signed)
Wonderful to see you today as always.  For your abdominal pain--we are going to transition you from omeprazole to pantoprazole to see if this makes a difference.  If you are continuing to notice symptoms, we can also add on a Pepcid to help.  Additionally make sure that you continue to avoid Motrin, ibuprofen.  We will also check some labs today, I will let you know those results in the next few days.  I would like you to follow-up in a few weeks or sooner if worsening.

## 2019-09-13 NOTE — Assessment & Plan Note (Addendum)
1 week history of burning epigastric abdominal pain with concurrent nighttime dry cough and mild globus sensation leading to perceived dyspnea.  Suspect this is further sequela of known refractory GERD with chronic PPI use.  However, given concurrent epigastric pain could also consider peptic ulcers, mild pancreatitis, gallbladder pathology.  Within the past 1-2 months was on a meloxicam trial for costochondritis, could consider this a precipitant.  Unfortunately cannot check for H. pylori today given current PPI.  Less concern for gallbladder given distribution and current diet, however will continue to consider pending further work-up/medication trial. Consider PE given pleuritic pain in nature, however ruled out through low pretest probability and PERC score for PE 0. - Transition omeprazole to pantoprazole 40 mg daily - CBC, CMP, lipase to further evaluate pancreas/gallbladder/liver function  - May add Pepcid if no difference with pantoprazole - Follow-up in approximately 1 month or sooner if worsening - To schedule with GI when she receives her insurance

## 2019-09-13 NOTE — Progress Notes (Signed)
Subjective:    Patient ID: Nona Gracey Lapinski, female    DOB: [birth year], 28 y.o.   MRN: [id number]   CC: "Inhaler issue"  HPI: Ms. Cantrell is a 28 year old female with a history of elevated BMI, anxiety, subclinical hypothyroidism, and GERD presenting discuss the following:  Abdominal pain with perceived dyspnea: Recently seen by Dr. Primitivo Gauze on 9/16 for shortness of breath, felt to be possibly related bronchial inflammation with possible allergens in her new apartment vs URI.  Prescribed albuterol at that time.  Today, she is following up because the albuterol was ineffective.  Now she notes a "different" shortness of breath that she feels in her epigastric area.  States the top of her abdomen feels like a burning sensation, sometimes sharp, and is making her feel short of breath because it hurts more when she takes in a deep breath.  Mainly notices both of these sensations after eating certain foods, but not sure which ones.  Has not been eating fatty or fried foods.  Does not notice any difference with physical activity, has been exercising daily without concern.  Has a bowel movement every day, normal.  Does endorse concurrent abdominal bloating, sometimes globus sensation within her throat (no dysphagia, odynophagia), nausea, and nighttime awakenings with dry cough.  Has a known history of GERD with small hiatal hernia for which she has been on omeprazole 40 mg for the past several years.  Have already placed referral for her to be seen by GI approximately 1-2 months ago as she continued to have frequent breakthrough symptoms of her GERD despite PPI use, however due to current lack of insurance she would have to pay out-of-pocket and not ready for that.  She has become full-time at her new job and is awaiting full insurance benefits here shortly.  Denies any lightheadedness/dizziness, chest pain, productive cough, fever, vomiting, melena, hematochezia.  Does not drink alcohol.   Smoking status  reviewed  Review of Systems Per HPI    Objective:  BP 112/64   Pulse 69   Wt 244 lb 3.2 oz (110.8 kg)   LMP 08/19/2019 (Exact Date)   SpO2 99%   BMI 44.66 kg/m  Vitals and nursing note reviewed  General: NAD, pleasant Cardiac: RRR, normal heart sounds, no murmurs HEENT: Mucous membranes moist, airway patent with nonerythematous oropharynx, no thyroid nodules or goiter palpated. Respiratory: CTAB, unlabored breathing, speaking full sentences without concern for the duration of the visit.  Satting 99% on room air. Abdomen: soft, nondistended, tender to epigastrium with palpation.  Normoactive bowel sounds, no hepatosplenomegaly palpated.  Negative Murphys sign. Extremities: no edema or cyanosis.. Skin: warm and dry Neuro: alert and oriented, no focal deficits Psych: normal affect  Assessment & Plan:   Epigastric pain 1 week history of burning epigastric abdominal pain with concurrent nighttime dry cough and mild globus sensation leading to perceived dyspnea.  Suspect this is further sequela of known refractory GERD with chronic PPI use.  However, given concurrent epigastric pain could also consider peptic ulcers, mild pancreatitis, gallbladder pathology.  Within the past 1-2 months was on a meloxicam trial for costochondritis, could consider this a precipitant.  Unfortunately cannot check for H. pylori today given current PPI.  Less concern for gallbladder given distribution and current diet, however will continue to consider pending further work-up/medication trial. Consider PE given pleuritic pain in nature, however ruled out through low pretest probability and PERC score for PE 0. - Transition omeprazole to pantoprazole 40 mg daily - CBC,  CMP, lipase to further evaluate pancreas/gallbladder/liver function  - May add Pepcid if no difference with pantoprazole - Follow-up in approximately 1 month or sooner if worsening - To schedule with GI when she receives her insurance    Follow-up in 1 month or sooner if worsening.  [place] [facility] Resident PGY-2

## 2019-09-14 ENCOUNTER — Encounter: Payer: Self-pay | Admitting: Family Medicine

## 2019-09-14 LAB — COMPREHENSIVE METABOLIC PANEL
ALT: 10 IU/L (ref 0–32)
AST: 7 IU/L (ref 0–40)
Albumin/Globulin Ratio: 1.3 (ref 1.2–2.2)
Albumin: 4 g/dL (ref 3.9–5.0)
Alkaline Phosphatase: 67 IU/L (ref 39–117)
BUN/Creatinine Ratio: 7 — ABNORMAL LOW (ref 9–23)
BUN: 6 mg/dL (ref 6–20)
Bilirubin Total: 0.6 mg/dL (ref 0.0–1.2)
CO2: 22 mmol/L (ref 20–29)
Calcium: 9.6 mg/dL (ref 8.7–10.2)
Chloride: 100 mmol/L (ref 96–106)
Creatinine, Ser: 0.9 mg/dL (ref 0.57–1.00)
GFR calc Af Amer: 101 mL/min/{1.73_m2} (ref >59)
GFR calc non Af Amer: 88 mL/min/{1.73_m2} (ref >59)
Globulin, Total: 3 g/dL (ref 1.5–4.5)
Glucose: 92 mg/dL (ref 65–99)
Potassium: 4 mmol/L (ref 3.5–5.2)
Sodium: 137 mmol/L (ref 134–144)
Total Protein: 7 g/dL (ref 6.0–8.5)

## 2019-09-14 LAB — CBC
Hematocrit: 36.3 % (ref 34.0–46.6)
Hemoglobin: 12.3 g/dL (ref 11.1–15.9)
MCH: 28.1 pg (ref 26.6–33.0)
MCHC: 33.9 g/dL (ref 31.5–35.7)
MCV: 83 fL (ref 79–97)
Platelets: 334 10*3/uL (ref 150–450)
RBC: 4.37 x10E6/uL (ref 3.77–5.28)
RDW: 13.5 % (ref 11.7–15.4)
WBC: 7.4 10*3/uL (ref 3.4–10.8)

## 2019-09-14 LAB — LIPASE: Lipase: 16 U/L (ref 14–72)

## 2019-09-15 ENCOUNTER — Encounter: Payer: Self-pay | Admitting: Family Medicine

## 2019-09-15 ENCOUNTER — Telehealth: Payer: Medicaid Other

## 2019-09-16 ENCOUNTER — Telehealth: Payer: Self-pay | Admitting: Family Medicine

## 2019-09-16 NOTE — Telephone Encounter (Signed)
Call patient in reference to her recent MyChart questions.  As she has seen no benefit with the inhaler, instructed her to stop using this.  Additionally in discussion says she is already feeling much better today without any abdominal pain or shortness of breath.  Instructed to continue doing the pantoprazole and Pepcid, monitor food triggers, and sit upright after meals.  Hopefully after few more days this will continue to kick in and be effective for her.  Could consider Gaviscon or Carafate in the future for further symptomatic relief.  Patriciaann Clan, DO

## 2019-09-20 ENCOUNTER — Encounter: Payer: Self-pay | Admitting: Family Medicine

## 2019-09-20 DIAGNOSIS — J302 Other seasonal allergic rhinitis: Secondary | ICD-10-CM

## 2019-09-21 MED ORDER — FLUTICASONE PROPIONATE 50 MCG/ACT NA SUSP: 2.0000 | Freq: Every day | NASAL | 1 refills | Status: DC | PRN

## 2019-09-29 ENCOUNTER — Encounter: Payer: Self-pay | Admitting: Family Medicine

## 2019-09-30 ENCOUNTER — Encounter: Payer: Self-pay | Admitting: Family Medicine

## 2019-10-06 ENCOUNTER — Encounter: Payer: Self-pay | Admitting: Family Medicine

## 2019-10-06 ENCOUNTER — Telehealth: Payer: Self-pay | Admitting: Family Medicine

## 2019-10-06 NOTE — Telephone Encounter (Signed)
Called pt to discuss reflux. SOB resolved with addition of pepcid, however experiencing mild globus sensation. Denies any dysphagia or odynophagia. Will have her transition to taking 1/2 her PPI in am and pm, rather than once daily, to see if this makes a difference.   Additionally scheduled for office visit tomorrow, will exam thyroid at that time.   Patriciaann Clan, DO

## 2019-10-07 ENCOUNTER — Ambulatory Visit (INDEPENDENT_AMBULATORY_CARE_PROVIDER_SITE_OTHER): Payer: Self-pay | Admitting: Family Medicine

## 2019-10-07 ENCOUNTER — Encounter: Payer: Self-pay | Admitting: Family Medicine

## 2019-10-07 ENCOUNTER — Other Ambulatory Visit: Payer: Self-pay

## 2019-10-07 ENCOUNTER — Other Ambulatory Visit (HOSPITAL_COMMUNITY)
Admission: RE | Admit: 2019-10-07 | Discharge: 2019-10-07 | Disposition: A | Discharge status: Home / Self Care | Payer: Medicaid Other | Source: Ambulatory Visit | Attending: Emergency Medicine | Admitting: Emergency Medicine

## 2019-10-07 ENCOUNTER — Other Ambulatory Visit: Payer: Self-pay | Admitting: Family Medicine

## 2019-10-07 VITALS — BP 118/60 | Wt 242.0 lb

## 2019-10-07 DIAGNOSIS — Z113 Encounter for screening for infections with a predominantly sexual mode of transmission: Secondary | ICD-10-CM

## 2019-10-07 DIAGNOSIS — N926 Irregular menstruation, unspecified: Secondary | ICD-10-CM

## 2019-10-07 DIAGNOSIS — Z114 Encounter for screening for human immunodeficiency virus [HIV]: Secondary | ICD-10-CM

## 2019-10-07 DIAGNOSIS — Z23 Encounter for immunization: Secondary | ICD-10-CM

## 2019-10-07 DIAGNOSIS — Z3009 Encounter for other general counseling and advice on contraception: Secondary | ICD-10-CM

## 2019-10-07 LAB — POCT WET PREP (WET MOUNT)
Clue Cells Wet Prep Whiff POC: NEGATIVE
Trichomonas Wet Prep HPF POC: ABSENT

## 2019-10-07 LAB — POCT URINE PREGNANCY: Preg Test, Ur: NEGATIVE

## 2019-10-07 MED ORDER — PANTOPRAZOLE SODIUM 40 MG PO TBEC: 40.0000 mg | DELAYED_RELEASE_TABLET | Freq: Every day | ORAL | 3 refills | Status: DC

## 2019-10-07 NOTE — Patient Instructions (Signed)
Wonderful to see you as always.  Please make sure you start taking a prenatal vitamin with 802 mcg of folic acid in it.  I will let you know the results within the next several days, these can take longer than normal labs.  Additionally, I highly encourage you to use barrier protection (condoms) while sexually active to reduce risk of STDs and pregnancy.  Please try to come back in sometime next week to do a urine pregnancy test, this can just be a lab visit.  We will look at starting OCPs in the next several weeks.

## 2019-10-07 NOTE — Progress Notes (Signed)
.    Subjective:    Patient ID: Veronica [name], female    DOB: 10/25/1991, 28 y.o.   MRN: [id number]   CC: STD screening  HPI: Veronica [name] is a 28 year old female with GERD, anxiety presenting discuss the following:  STD screening: Routine screening, asymptomatic.  Denies any vaginal discharge, vaginal itching/irritation, rash, abdominal pain, dysuria, dyspareunia, fever, fatigue.  She recently had a new sexual partner and just wants to make sure everything is "all clear."  She is not currently on any contraception, does not use condoms.  Does not desire pregnancy at this time.  Last had unprotected intercourse maybe about 5-6 days ago, she is unsure.  LMP 10/6, however has had a little bit of spotting since 10/28.  Cycle is usually regular, monthly starting normally at the beginning of the month. Current vaginal bleeding is not her typical cycle.  Normal flow.  She would be interested in starting contraception therapy, has tried Nexplanon and Depo in the past with unwanted side effects including weight gain and irregular bleeding.   Smoking status reviewed  Review of Systems Per HPI    Objective:  BP 118/60   Wt 242 lb (109.8 kg)   LMP 10/05/2019 Comment: LMP prior was 09-13-2019  BMI 44.26 kg/m  Vitals and nursing note reviewed  General: NAD, pleasant Respiratory: normal effort Abdomen: soft, nontender, nondistended Extremities: no edema or cyanosis. WWP.  Pelvic: Pelvic exam: normal external genitalia, vulva, vagina, cervix, uterus and adnexa, VULVA: normal appearing vulva with no masses, tenderness or lesions, VAGINA: normal appearing vagina with normal color, scant bloody discharge, no lesions, CERVIX: normal appearing cervix without discharge or lesions, cervical motion tenderness absent, no palpable internal organs. Skin: warm and dry Psych: normal affect  Assessment & Plan:   Screening examination for STD (sexually transmitted disease) Asymptomatic, however at risk with  current practices. Pelvic exam unremarkable. -Wet prep, gonorrhea/chlamydia -RPR, HIV -Highly encouraged routine use of barrier protection  Contraceptive education Currently sexually active without use of contraception, does not desire pregnancy. She is interested in starting OCPs, last unprotected intercourse about 5 to 6 days ago with atypical menstrual cycle currently.  Reassuringly Upreg negative in the office today, resulted after visit completion.  Will call patient to discuss starting OCPs now with repeat Upreg in 2 weeks vs starting in 1 month following her next regular menstrual cycle.  -To start OCPs as discussed above -Reinforced importance of barrier protection every time -Start prenatal vitamin with 465 mcg folic acid   Received flu shot today.  Follow-up for repeat U preg in 2 weeks or as needed for chronic conditions.  [place] Resident PGY-2

## 2019-10-08 ENCOUNTER — Encounter: Payer: Self-pay | Admitting: Family Medicine

## 2019-10-08 DIAGNOSIS — Z3009 Encounter for other general counseling and advice on contraception: Secondary | ICD-10-CM | POA: Insufficient documentation

## 2019-10-08 DIAGNOSIS — Z113 Encounter for screening for infections with a predominantly sexual mode of transmission: Secondary | ICD-10-CM | POA: Insufficient documentation

## 2019-10-08 LAB — RPR: RPR Ser Ql: NONREACTIVE

## 2019-10-08 LAB — HIV ANTIBODY (ROUTINE TESTING W REFLEX): HIV Screen 4th Generation wRfx: NONREACTIVE

## 2019-10-08 NOTE — Assessment & Plan Note (Signed)
Currently sexually active without use of contraception, does not desire pregnancy. She is interested in starting OCPs, last unprotected intercourse about 5 to 6 days ago with atypical menstrual cycle currently.  Reassuringly Upreg negative in the office today, resulted after visit completion.  Will call patient to discuss starting OCPs now with repeat Upreg in 2 weeks vs starting in 1 month following her next regular menstrual cycle.  -To start OCPs as discussed above -Reinforced importance of barrier protection every time -Start prenatal vitamin with 492 mcg folic acid

## 2019-10-08 NOTE — Assessment & Plan Note (Addendum)
Asymptomatic, however at risk with current practices. Pelvic exam unremarkable. -Wet prep, gonorrhea/chlamydia -RPR, HIV -Highly encouraged routine use of barrier protection

## 2019-10-10 ENCOUNTER — Encounter: Payer: Self-pay | Admitting: Family Medicine

## 2019-10-10 ENCOUNTER — Telehealth: Payer: Self-pay | Admitting: Family Medicine

## 2019-10-10 DIAGNOSIS — Z3009 Encounter for other general counseling and advice on contraception: Secondary | ICD-10-CM

## 2019-10-10 LAB — CERVICOVAGINAL ANCILLARY ONLY
Chlamydia: NEGATIVE
Comment: NEGATIVE
Comment: NORMAL
Neisseria Gonorrhea: NEGATIVE

## 2019-10-10 MED ORDER — NORGESTIMATE-ETH ESTRADIOL 0.25-35 MG-MCG PO TABS: 1.0000 | ORAL_TABLET | Freq: Every day | ORAL | 1 refills | Status: DC

## 2019-10-10 NOTE — Telephone Encounter (Signed)
Called patient to discuss starting OCPs.  Seen on 10/30, Upreg negative at that time with recent unprotected intercourse about 5-6 days prior to with last normal menses at the beginning of 09/2019.  Discussed options including starting OCPs now with repeat U preg in 2 weeks vs waiting to start OCPs at next normal menses.  She decided with starting now, will send in [place].  Counseled on using backup contraception method for at least the 7 days and common side effects including nausea associated with OCPs.  Instructed to take medication at the same time every day.  She will call the office tomorrow to schedule nurses visit for a repeat pregnancy test in 2 weeks.  Patriciaann Clan, DO

## 2019-10-13 ENCOUNTER — Telehealth: Payer: Self-pay | Admitting: Family Medicine

## 2019-10-13 ENCOUNTER — Encounter: Payer: Self-pay | Admitting: Nurse Practitioner

## 2019-10-13 ENCOUNTER — Encounter: Payer: Self-pay | Admitting: Family Medicine

## 2019-10-13 NOTE — Telephone Encounter (Signed)
Call patient to discuss recent MyChart message.  Further delved into shortness of breath sensation, appears to be that she is referring more to the fact that her mouth will get dry after talking a while and will have to swallow more frequently.  She walks every day and exercises without concern.  Continues to have sensation of fullness in her throat with known GERD.  Denies any dysphagia, odynophagia, food getting stuck, change in voice.  Already taking Protonix 20 mg twice daily and Pepcid with continued GERD symptoms.  Instructed she may take Tums as needed to help as well.  Ultimately would like her to be seen by GI for further evaluation.  Previously referred her back in August, however did not have insurance at that time.  She will be signing up for insurance through her new work on 11/11, recommended touching base with GI again after that.  Patriciaann Clan, DO

## 2019-10-14 ENCOUNTER — Encounter: Payer: Self-pay | Admitting: Family Medicine

## 2019-10-18 ENCOUNTER — Encounter: Payer: Self-pay | Admitting: Family Medicine

## 2019-10-25 ENCOUNTER — Other Ambulatory Visit: Payer: Self-pay

## 2019-10-25 ENCOUNTER — Encounter: Payer: Self-pay | Admitting: Nurse Practitioner

## 2019-10-25 ENCOUNTER — Telehealth: Payer: Self-pay

## 2019-10-25 ENCOUNTER — Telehealth: Payer: Self-pay | Admitting: Nurse Practitioner

## 2019-10-25 ENCOUNTER — Ambulatory Visit: Payer: Self-pay | Admitting: Nurse Practitioner

## 2019-10-25 VITALS — BP 110/68 | HR 76 | Temp 97.6°F | Ht 62.0 in | Wt 242.0 lb

## 2019-10-25 DIAGNOSIS — Z1159 Encounter for screening for other viral diseases: Secondary | ICD-10-CM

## 2019-10-25 DIAGNOSIS — R0989 Other specified symptoms and signs involving the circulatory and respiratory systems: Secondary | ICD-10-CM

## 2019-10-25 DIAGNOSIS — R1310 Dysphagia, unspecified: Secondary | ICD-10-CM

## 2019-10-25 NOTE — Telephone Encounter (Signed)
Patient called stating that she had her Covid test done today and was told her results will be back tomorrow and wants to have her procedure moved to Thursday.  I moved the patient to Thursday but made her aware that her test results may not be back and if not,  she can not have her procedure done on Thursday.  Patient verbalized understanding.  Date change and time change of procedure reviewed with patient.  Patient verbalized understanding.

## 2019-10-25 NOTE — Telephone Encounter (Signed)
Pt saw Nevin Bloodgood this morning, she states that she was told that if she was able to get her Covid test today, we were going to move up her egd that is scheduled for next Monday. Pt just had her Covid test at [facility] pathology so she wants to r/s her egd for this week.

## 2019-10-25 NOTE — Patient Instructions (Signed)
If you are age 27 or older, your body mass index should be between 23-30. Your Body mass index is 44.26 kg/m. If this is out of the aforementioned range listed, please consider follow up with your Primary Care Provider.  If you are age 78 or younger, your body mass index should be between 19-25. Your Body mass index is 44.26 kg/m. If this is out of the aformentioned range listed, please consider follow up with your Primary Care Provider.   You have been scheduled for an endoscopy. Please follow written instructions given to you at your visit today. If you use inhalers (even only as needed), please bring them with you on the day of your procedure. Your physician has requested that you go to www.startemmi.com and enter the access code given to you at your visit today. This web site gives a general overview about your procedure. However, you should still follow specific instructions given to you by our office regarding your preparation for the procedure.  Thank you for choosing me and [facility].   Tye Savoy, NP

## 2019-10-25 NOTE — Progress Notes (Signed)
Referring Provider: Leticia Penna, DO  Reason for Referral: GERD          ASSESSMENT / PLAN:   53. 28 year old female with 45-month history of what sounds like globus sensation as well as intermittent solid food dysphagia.  No improvement after several weeks on Pepcid and pantoprazole.  She could of had a progression of the 6 cm hiatal hernia found on EGD in 2014.  --For further evaluation patient will be scheduled for EGD.  I explained that if dysphagia due to narrowing/stricture then we might dilate her esophagus.  The risks and benefits of EGD were discussed and the patient agrees to proceed. Patient known to Dr. Rhea Belton but Dr. Leone Payor will perform her EGD this coming Monday.  --For now continue current GERD regimen with pantoprazole and Pepcid --GERD literature given  2.  Epigastric discomfort.  This occurs when exercising/bending.  Patient thinks the pain is secondary to hiatal hernia but this seems unlikely.  Suspect more musculoskeletal pain.  Further evaluation at time of EGD  HPI:    Chief Complaint:   Feels like something is stuck in throat and food sticking in chest  Veronica [name] is a 28 year old female with a history of obesity, GERD, 6 cm hiatal hernia.  She is known to Dr. Rhea Belton but not seen since 2014.  Three months ago Veronica [name] developed sensation that something was in her throat.  She also has been having intermittent solid food dysphagia.  These symptoms can be associated with shortness of breath.  She has no heartburn or regurgitation.  Regarding dysphagia, meat is the main culprit.. No nausea / vomiting. She has been on both pantoprazole and Pepcid for several weeks without any improvement in symptoms Veronica [name] has actively been trying to lose weight.  She is down nearly 40 pounds since mid July.  Lately it has been hard for her to do exercises because of hiatal hernia discomfort.   Data Reviewed:  09/13/19 CMET - nl CBC - nl    Past Medical History:  Diagnosis Date  .  Abdominal pain 01/06/2012  . Anemia   . Ankle pain   . Birth control counseling 08/26/2011  . GERD (gastroesophageal reflux disease)   . Hiatal hernia   . Obesity (BMI 30-39.9)   . Secondary amenorrhea 03/25/2018     Past Surgical History:  Procedure Laterality Date  . CESAREAN SECTION  09/02/2010   For macrosomia, but son was 7 lb 12 oz  . TONSILLECTOMY AND ADENOIDECTOMY     Family History  Problem Relation Age of Onset  . Diabetes Mother   . Hypertension Mother   . Obesity Mother   . Heart attack Mother 64       Death  . Obesity Sister   . Osteochondroma Sister   . Diabetes Sister   . Hypertension Sister    Social History   Tobacco Use  . Smoking status: Never Smoker  . Smokeless tobacco: Never Used  Substance Use Topics  . Alcohol use: No  . Drug use: No   Current Outpatient Medications  Medication Sig Dispense Refill  . busPIRone (BUSPAR) 5 MG tablet Take 1 tablet (5 mg total) by mouth 2 (two) times daily. 60 tablet 0  . fluticasone (FLONASE) 50 MCG/ACT nasal spray Place 2 sprays into both nostrils daily as needed for allergies. 16 g 1  . hydrOXYzine (ATARAX/VISTARIL) 25 MG tablet Take 1 tablet (25 mg total) by mouth every 6 (six) hours as needed for anxiety. 15  tablet 1  . Melatonin 5 MG TABS Take 1 tablet (5 mg total) by mouth Nightly. 2-3 hours prior to bedtime 30 tablet 1  . norgestimate-ethinyl estradiol (ORTHO-CYCLEN) 0.25-35 MG-MCG tablet Take 1 tablet by mouth daily. 1 Package 1  . ondansetron (ZOFRAN) 4 MG tablet Take 1 tablet (4 mg total) by mouth every 8 (eight) hours as needed for nausea or vomiting. 10 tablet 0  . pantoprazole (PROTONIX) 40 MG tablet Take 1 tablet (40 mg total) by mouth daily. 30 tablet 3  . potassium chloride SA (K-DUR,KLOR-CON) 20 MEQ tablet Take 1 tablet (20 mEq total) 2 (two) times daily by mouth. 6 tablet 0  . Pseudoeph-Doxylamine-DM-APAP (NYQUIL PO) Take 30 mLs by mouth at bedtime as needed (sleep).    . sertraline (ZOLOFT) 100 MG  tablet Take 1 tablet (100 mg total) by mouth daily. 90 tablet 0  . sodium chloride (OCEAN) 0.65 % SOLN nasal spray Place 1 spray into both nostrils as needed for congestion. 15 mL 1   No current facility-administered medications for this visit.    Allergies  Allergen Reactions  . Morphine And Related Other (See Comments)    Causes "chest to be heavy"  . Motrin [Ibuprofen]     Was told to not take this because it would flare stomach issues     Review of Systems: All systems reviewed and negative except where noted in HPI.    Physical Exam:    Wt Readings from Last 3 Encounters:  10/25/19 242 lb (109.8 kg)  10/07/19 242 lb (109.8 kg)  09/13/19 244 lb 3.2 oz (110.8 kg)    BP 110/68   Pulse 76   Temp 97.6 F (36.4 C)   Ht 5\' 2"  (1.575 m)   Wt 242 lb (109.8 kg)   LMP 10/05/2019 Comment: LMP prior was 09-13-2019  BMI 44.26 kg/m  Constitutional:  Pleasant female in no acute distress. Psychiatric: Normal mood and affect. Behavior is normal. EENT: Pupils normal.  Conjunctivae are normal. No scleral icterus. Neck supple.  Cardiovascular: Normal rate, regular rhythm. No edema Pulmonary/chest: Effort normal and breath sounds normal. No wheezing, rales or rhonchi. Abdominal: Soft, nondistended, nontender. Bowel sounds active throughout. There are no masses palpable. No hepatomegaly. Neurological: Alert and oriented to person place and time. Skin: Skin is warm and dry. No rashes noted.  Tye Savoy, NP  10/25/2019, 12:10 PM  Cc: Patriciaann Clan, DO

## 2019-10-26 ENCOUNTER — Encounter: Payer: Self-pay | Admitting: Family Medicine

## 2019-10-26 LAB — SARS CORONAVIRUS 2 (TAT 6-24 HRS): SARS Coronavirus 2: NEGATIVE

## 2019-10-26 NOTE — Progress Notes (Signed)
Addendum: Reviewed and agree with assessment and management plan. Shaterrica Territo M, MD  

## 2019-10-27 ENCOUNTER — Ambulatory Visit (AMBULATORY_SURGERY_CENTER): Payer: Self-pay | Admitting: Internal Medicine

## 2019-10-27 ENCOUNTER — Encounter: Payer: Self-pay | Admitting: Internal Medicine

## 2019-10-27 ENCOUNTER — Encounter: Payer: Medicaid Other | Admitting: Internal Medicine

## 2019-10-27 ENCOUNTER — Other Ambulatory Visit: Payer: Self-pay

## 2019-10-27 VITALS — BP 112/71 | HR 55 | Temp 98.5°F | Resp 14 | Ht 62.0 in | Wt 242.0 lb

## 2019-10-27 DIAGNOSIS — R0989 Other specified symptoms and signs involving the circulatory and respiratory systems: Secondary | ICD-10-CM

## 2019-10-27 DIAGNOSIS — K449 Diaphragmatic hernia without obstruction or gangrene: Secondary | ICD-10-CM

## 2019-10-27 DIAGNOSIS — R1310 Dysphagia, unspecified: Secondary | ICD-10-CM

## 2019-10-27 DIAGNOSIS — R1013 Epigastric pain: Secondary | ICD-10-CM

## 2019-10-27 MED ORDER — BUSPIRONE HCL 10 MG PO TABS: 10.0000 mg | ORAL_TABLET | Freq: Two times a day (BID) | ORAL | 1 refills | Status: DC

## 2019-10-27 MED ORDER — SODIUM CHLORIDE 0.9 % IV SOLN: 500.0000 mL | Freq: Once | INTRAVENOUS | Status: DC

## 2019-10-27 NOTE — Progress Notes (Signed)
1108  Two tongue studs in place, pt reports very difficult to remove and desires they remain in place

## 2019-10-27 NOTE — Progress Notes (Signed)
1117  Two tongue studs remain in place after procedure completed

## 2019-10-27 NOTE — Progress Notes (Signed)
A/ox3, pleased with MAC, report to RN 

## 2019-10-27 NOTE — Patient Instructions (Addendum)
PLEASE READ YOUR HANDOUT ON HIATAL HERNIA  The exam was ok - still have a hiatal hernia but I do not think it is causing your problems.  Nothing to explain the lump in throat here or on neck exam.  In my experience the most common cause of this sensation if no abnormality here is anxiety. I understand that is something you have. What seems to happen is the muscles get tense and create the sensation of something sitting there. When the stress or anxiety lessens this can get better.  The abdominal pain may be from exercising, could be from spasms of the stomach or esophagus.  I often find buspirone helps this - I see it on your med list but at a low dose and it seems you are not taking it.  I think taking that at 10 or 15 mg twice a day could help. I prescribed it.  You can discuss more with primary care provider, Dr. Annia Friendly.  Congratulations on the weight loss.  Here is some other nutrition info. You are likely doing a lot of this already.  As you lose more weight you may be able to get off pantoprazole.  Healthy and nutritious eating and weight loss are made to be harder than they need to be. A simplified diet approach based around eating normally, as much as you want without restricting intake too much is better, I think. You must avoid packaged foods and try to eat real foods as much as possible. Packaged foods, sugary sodas, highly processed foods taste great but are slow poisons that lead to obesity and/or poor health.  It is very helpful to take some time each week and plan your meals. Work with your spouse, partner, family to do this as much as possible. Preparing meals ahead to take to work or school is especially helpful and will save money, too. You can do this and by working together it can take less time.  Some resources that I like are:  www.gaplesinstitute.org (Do the learning modules about healthy eating)  www.dietdoctor.com - helps with low carb diets and also can learn  about and consider intermittent  fasting. If you have diabetes would not do intermittent fasting without checking with your doctor. Best to change what you eat before doing this.  Here are some guidelines to help you with meal planning -  Avoid all processed and packaged foods (bread, pasta, crackers, chips, etc) and beverages containing calories.  Avoid added sugars and excessive natural sugars.  Attention to how you feel if you consume artificial sweeteners.  Do they make you more hungry or raise your blood sugar?  With every meal and snack, aim to get 20 g of protein (3 ounces of meat, 4 ounces of fish, 3 eggs, protein powder, 1 cup [place] yogurt, 1 cup cottage cheese, etc.)  Increase fiber in the form of non-starchy vegetables.  These help you feel full with very little carbohydrates and are good for gut health.  Eat 1 serving healthy carb per meal- 1/2 cup brown rice, beans, potato, corn- pay attention to whether or not this significantly raises your blood sugar if you are checking blood sugars. If it does, reduce the frequency you consume these.   Eat 2-3 servings of lower sugar fruits daily.  This includes berries, apples, oranges, peaches, pears, one half banana.  Have small amounts of good fats such as avocado, nuts, olive oil, nut butters, olives.  Add a little cheese to your salads to make  them tasty.    I appreciate the opportunity to care for you.  Gatha Mayer, MD, FACG  YOU HAD AN ENDOSCOPIC PROCEDURE TODAY AT [facility]:   Refer to the procedure report that was given to you for any specific questions about what was found during the examination.  If the procedure report does not answer your questions, please call your gastroenterologist to clarify.  If you requested that your care partner not be given the details of your procedure findings, then the procedure report has been included in a sealed envelope for you to review at your convenience later.  Please  Note:  You might notice some irritation and congestion in your nose or some drainage.  This is from the oxygen used during your procedure.  There is no need for concern and it should clear up in a day or so.  SYMPTOMS TO REPORT IMMEDIATELY:   Following upper endoscopy (EGD)  Vomiting of blood or coffee ground material  New chest pain or pain under the shoulder blades  Painful or persistently difficult swallowing  New shortness of breath  Fever of 100F or higher  Black, tarry-looking stools  For urgent or emergent issues, a gastroenterologist can be reached at any hour by calling [phone].   DIET:  We do recommend a small meal at first, but then you may proceed to your regular diet.  Drink plenty of fluids but you should avoid alcoholic beverages for 24 hours.  ACTIVITY:  You should plan to take it easy for the rest of today and you should NOT DRIVE or use heavy machinery until tomorrow (because of the sedation medicines used during the test).    FOLLOW UP: Our staff will call the number listed on your records 48-72 hours following your procedure to check on you and address any questions or concerns that you may have regarding the information given to you following your procedure. If we do not reach you, we will leave a message.  We will attempt to reach you two times.  During this call, we will ask if you have developed any symptoms of COVID 19. If you develop any symptoms (ie: fever, flu-like symptoms, shortness of breath, cough etc.) before then, please call [phone].  If you test positive for Covid 19 in the 2 weeks post procedure, please call and report this information to [place].    If any biopsies were taken you will be contacted by phone or by letter within the next 1-3 weeks.  Please call us at [phone] if you have not heard about the biopsies in 3 weeks.    SIGNATURES/CONFIDENTIALITY: You and/or your care partner have signed paperwork which will be entered into your  electronic medical record.  These signatures attest to the fact that that the information above on your After Visit Summary has been reviewed and is understood.  Full responsibility of the confidentiality of this discharge information lies with you and/or your care-partner.

## 2019-10-27 NOTE — Op Note (Addendum)
[facility] Patient Name: Veronica [name] Procedure Date: 10/27/2019 11:05 AM MRN: [id number] Endoscopist: Gatha Mayer , MD Age: 28 Referring MD:  Date of Birth: [birth year] Gender: Female Account #: [id number] Procedure:                Upper GI endoscopy Indications:              Epigastric abdominal pain, Globus sensation Medicines:                Propofol per Anesthesia, Monitored Anesthesia Care Procedure:                Pre-Anesthesia Assessment:                           - Prior to the procedure, a History and Physical                            was performed, and patient medications and                            allergies were reviewed. The patient's tolerance of                            previous anesthesia was also reviewed. The risks                            and benefits of the procedure and the sedation                            options and risks were discussed with the patient.                            All questions were answered, and informed consent                            was obtained. Prior Anticoagulants: The patient has                            taken no previous anticoagulant or antiplatelet                            agents. ASA Grade Assessment: III - A patient with                            severe systemic disease. After reviewing the risks                            and benefits, the patient was deemed in                            satisfactory condition to undergo the procedure.                           After obtaining informed consent, the endoscope was  passed under direct vision. Throughout the                            procedure, the patient's blood pressure, pulse, and                            oxygen saturations were monitored continuously. The                            Endoscope was introduced through the mouth, and                            advanced to the second part of duodenum. The upper                     GI endoscopy was accomplished without difficulty.                            The patient tolerated the procedure well. Scope In: Scope Out: Findings:                 A 4 cm hiatal hernia was present.                           The exam was otherwise without abnormality.                           The cardia and gastric fundus were normal on                            retroflexion. Complications:            No immediate complications. Estimated Blood Loss:     Estimated blood loss: none. Impression:               - 4 cm hiatal hernia.                           - The examination was otherwise normal.                           - No specimens collected. Recommendation:           - Patient has a contact number available for                            emergencies. The signs and symptoms of potential                            delayed complications were discussed with the                            patient. Return to normal activities tomorrow.                            Written discharge instructions were provided to the  patient.                           - Resume previous diet.                           - Continue present medications.                           - I think anxiety may be causing globus                           exercise may be linked to epigastric pain though                            negative Carnett's sign                           I think going back on buspirone and at higher doses                            would help - 10 mg bid or 15 mg bid she agreed - Rx                            10 mg bid                           She has done a fantastic job with weight loss and                            exercise and is encouraged to continue                           see GI prn at this point - as she loses more weight                            may consider reducing/eliminating ppi Iva Boop, MD 10/27/2019 11:29:41 AM This report  has been signed electronically.

## 2019-10-28 ENCOUNTER — Encounter: Payer: Self-pay | Admitting: Family Medicine

## 2019-10-31 ENCOUNTER — Telehealth: Payer: Self-pay | Admitting: Family Medicine

## 2019-10-31 ENCOUNTER — Telehealth: Payer: Self-pay | Admitting: *Deleted

## 2019-10-31 ENCOUNTER — Other Ambulatory Visit: Payer: Self-pay | Admitting: Family Medicine

## 2019-10-31 ENCOUNTER — Encounter: Payer: Self-pay | Admitting: Family Medicine

## 2019-10-31 ENCOUNTER — Encounter: Payer: Medicaid Other | Admitting: Internal Medicine

## 2019-10-31 DIAGNOSIS — F411 Generalized anxiety disorder: Secondary | ICD-10-CM

## 2019-10-31 MED ORDER — SERTRALINE HCL 100 MG PO TABS: 100.0000 mg | ORAL_TABLET | Freq: Every day | ORAL | 3 refills | Status: DC

## 2019-10-31 MED ORDER — PANTOPRAZOLE SODIUM 40 MG PO TBEC: 40.0000 mg | DELAYED_RELEASE_TABLET | Freq: Every day | ORAL | 3 refills | Status: DC

## 2019-10-31 NOTE — Telephone Encounter (Signed)
Call patient in regards to her MyChart messages.  She continues to struggle with intermittent shortness of breath and globus sensation. Has lost ~ 60 lbs purposely since 05/2019. Recently seen by GI with unremarkable EGD on 11/19 showing reduction in her known hiatal hernia from 6 cm to 4 cm.  Felt that her globus sensation/dyspnea may be related to anxiety, added on BuSpar to her Zoloft 100 mg.  Has not noticed a difference with this addition yet.  She has not noticed any consistent provoking movements or activities that cause her perceived shortness of breath.  Has been walking for exercise regimen with no problems.  Does endorse some orthopnea that she feels may be new in the last several months.  Could reassess for a goiter and thyroid function given previous history of subclinical hypothyroidism.  Challenging to truly say what has been causing her perceived dyspnea and globus sensation, may all be attributed to anxiety especially given the atypical nature, however do believe it warrants further evaluation, has appointment on 11/25 in the clinic.  In previous evaluations for this complaint, she is always been noted to have unlabored breathing, speaking in full sentences, and satting appropriate on RA.  Previous trial of albuterol inhaler ineffective.  Recommended keeping a journal of when she experiences the SOB, how she felt prior to onset, and what she was doing at the time to see if we can better assess for a pattern, told her to bring this into her appointment.  Has a history of COVID-19 infection in 05/2019, could consider myocardial changes however would suspect more consistent symptomatology.   Patriciaann Clan, DO  Family Medicine PGY-2

## 2019-10-31 NOTE — Telephone Encounter (Signed)
Patient called to get a refill on her medications pantoprazole, and her sertraline. Please give pt a call back.

## 2019-10-31 NOTE — Telephone Encounter (Signed)
  Follow up Call-  Call back number 10/27/2019  Post procedure Call Back phone  # [phone]  Permission to leave phone message Yes  Some recent data might be hidden     Patient questions:  Do you have a fever, pain , or abdominal swelling? No. Pain Score  0 *  Have you tolerated food without any problems? No.  Have you been able to return to your normal activities? Yes.    Do you have any questions about your discharge instructions: Diet   No. Medications  No. Follow up visit  No.  Do you have questions or concerns about your Care? No.  Actions: * If pain score is 4 or above: 1. No action needed, pain <4.Have you developed a fever since your procedure? no  2.   Have you had an respiratory symptoms (SOB or cough) since your procedure? SOB which she has had since before the proceedure  3.   Have you tested positive for COVID 19 since your procedure no  4.   Have you had any family members/close contacts diagnosed with the COVID 19 since your procedure?  no   If yes to any of these questions please route to Joylene John, RN and Alphonsa Gin, Therapist, sports.

## 2019-10-31 NOTE — Telephone Encounter (Signed)
No answer for post procedure follow up call, Left message and will call back. SM

## 2019-11-02 ENCOUNTER — Ambulatory Visit (INDEPENDENT_AMBULATORY_CARE_PROVIDER_SITE_OTHER): Payer: Self-pay | Admitting: Family Medicine

## 2019-11-02 ENCOUNTER — Other Ambulatory Visit: Payer: Self-pay

## 2019-11-02 VITALS — BP 99/60 | HR 61 | Wt 240.6 lb

## 2019-11-02 DIAGNOSIS — K449 Diaphragmatic hernia without obstruction or gangrene: Secondary | ICD-10-CM

## 2019-11-02 DIAGNOSIS — R0602 Shortness of breath: Secondary | ICD-10-CM

## 2019-11-02 DIAGNOSIS — R0600 Dyspnea, unspecified: Secondary | ICD-10-CM | POA: Insufficient documentation

## 2019-11-02 NOTE — Progress Notes (Signed)
   Subjective:    Patient ID: Veronica [name], female    DOB: [birth year], 28 y.o.   MRN: [id number]   CC: Hernia follow-up and breathing issues  HPI: Hernia follow-up Patient reports her hernia is bothering her more.  States is in her abdomen.  States that since hurting the more in the past 2 days but has been present for years now.  States is constant pain.  Does not have pain anymore today.  Is using heating pads for pain.  Having regular bowel movements.  No nausea, vomiting, diarrhea.  No fevers or chills.  Denies smoking history.  Does report some bloating.  Would like to get something done.  SOB Patient presenting for issues of shortness of breath x2 months.  States that she has trouble breathing even just with talking.  States headache per with both activity and rest.  Is however able to go to the gym some days.  He said shortness of breath is all day long.  Did have coronavirus in the past but did not have shortness of breath during this.  Has been exercising and lost 60 pounds due to this.  States that sometimes she feels like something is stuck in her throat.  Does report a cough on occasion.  Tried albuterol in the past which helped some but not at other times.   Objective:  BP 99/60   Pulse 61   Wt 240 lb 9.6 oz (109.1 kg)   LMP 10/05/2019 Comment: LMP prior was 09-13-2019  SpO2 100%   BMI 44.01 kg/m  Vitals and nursing note reviewed  General: well nourished, in no acute distress HEENT: normocephalic, TM's visualized bilaterally,PERRL, no scleral icterus or conjunctival pallor, no nasal discharge, moist mucous membranes, good dentition without erythema or discharge noted in posterior oropharynx Neck: supple, non-tender, without lymphadenopathy, no thyromegaly  Cardiac: RRR, clear S1 and S2, no murmurs, rubs, or gallops Respiratory: clear to auscultation bilaterally, no increased work of breathing, speaking full sentences, no accessory muscle use  Abdomen: soft, nontender,  nondistended, no masses or organomegaly. Bowel sounds present Extremities: no edema or cyanosis. Warm, well perfused. 2+ radial and PT pulses bilaterally Skin: warm and dry, no rashes noted Neuro: alert and oriented, no focal deficits   Assessment & Plan:    Hiatal hernia Patient with hiatal hernia seen both on CT as well as EGD.  Patient will likely need further evaluation by GI if this is bothering her.  She already is established with them so I advised her to call to set up a follow-up appointment.  SOB (shortness of breath) Progressive shortness of breath with both rest and exertion.  Unclear etiology at this time.  Will obtain CBC to ensure no anemia.  We will also obtain TSH as her PCP was concerned.  I will also refer to pulmonology as I do believe she will benefit from a pulmonary function test.  Strict return precautions given.  Follow-up in 2 weeks.    Return in about 2 weeks (around 11/16/2019).   Caroline More, DO, PGY-3

## 2019-11-02 NOTE — Assessment & Plan Note (Signed)
Patient with hiatal hernia seen both on CT as well as EGD.  Patient will likely need further evaluation by GI if this is bothering her.  She already is established with them so I advised her to call to set up a follow-up appointment.

## 2019-11-02 NOTE — Assessment & Plan Note (Signed)
Progressive shortness of breath with both rest and exertion.  Unclear etiology at this time.  Will obtain CBC to ensure no anemia.  We will also obtain TSH as her PCP was concerned.  I will also refer to pulmonology as I do believe she will benefit from a pulmonary function test.  Strict return precautions given.  Follow-up in 2 weeks.

## 2019-11-02 NOTE — Patient Instructions (Signed)
It was a pleasure seeing you today.   Today we discussed your shortness of breath and hernia  For your hernia: please schedule a follow up with your GI doctor  For your shortness of breath: I have referred you to pulmonology. They will likely get a Pulmonary function test. I am also getting lab work. You will get a call or mychart message with results  Please follow up in 2 weeks or sooner if symptoms persist or worsen. Please call the clinic immediately if you have any concerns.   Our clinic's number is [phone]. Please call with questions or concerns.   Please go to the emergency room if you have chest pain or worsening shortness of breath  Thank you,  Caroline More, DO

## 2019-11-03 LAB — CBC
Hematocrit: 34.4 % (ref 34.0–46.6)
Hemoglobin: 11.4 g/dL (ref 11.1–15.9)
MCH: 27.8 pg (ref 26.6–33.0)
MCHC: 33.1 g/dL (ref 31.5–35.7)
MCV: 84 fL (ref 79–97)
Platelets: 334 10*3/uL (ref 150–450)
RBC: 4.1 x10E6/uL (ref 3.77–5.28)
RDW: 13.1 % (ref 11.7–15.4)
WBC: 7.9 10*3/uL (ref 3.4–10.8)

## 2019-11-03 LAB — TSH: TSH: 3.45 u[IU]/mL (ref 0.450–4.500)

## 2019-11-06 ENCOUNTER — Encounter: Payer: Self-pay | Admitting: Family Medicine

## 2019-11-09 ENCOUNTER — Encounter: Payer: Self-pay | Admitting: Family Medicine

## 2019-11-11 ENCOUNTER — Other Ambulatory Visit: Payer: Self-pay

## 2019-11-11 ENCOUNTER — Encounter: Payer: Self-pay | Admitting: Family Medicine

## 2019-11-11 ENCOUNTER — Ambulatory Visit (INDEPENDENT_AMBULATORY_CARE_PROVIDER_SITE_OTHER): Payer: Self-pay | Admitting: Family Medicine

## 2019-11-11 VITALS — BP 122/58 | HR 92 | Wt 241.6 lb

## 2019-11-11 DIAGNOSIS — Z3009 Encounter for other general counseling and advice on contraception: Secondary | ICD-10-CM

## 2019-11-11 DIAGNOSIS — Z6841 Body Mass Index (BMI) 40.0 and over, adult: Secondary | ICD-10-CM

## 2019-11-11 DIAGNOSIS — N926 Irregular menstruation, unspecified: Secondary | ICD-10-CM

## 2019-11-11 LAB — POCT URINE PREGNANCY: Preg Test, Ur: NEGATIVE

## 2019-11-11 MED ORDER — NORGESTIMATE-ETH ESTRADIOL 0.25-35 MG-MCG PO TABS: 1.0000 | ORAL_TABLET | Freq: Every day | ORAL | 11 refills | Status: DC

## 2019-11-11 MED ORDER — PANTOPRAZOLE SODIUM 40 MG PO TBEC: 40.0000 mg | DELAYED_RELEASE_TABLET | Freq: Every day | ORAL | 3 refills | Status: DC

## 2019-11-11 NOTE — Patient Instructions (Addendum)
Dear Veronica [name],   It was good to see you! Thank you for taking your time to come in to be seen. Today, we discussed the following:   Birth Control   Sent birth control pills to the pharmacy   Weight loss . I am so glad you are going to the gym and working out multiple times a week. Keep up the great work !     Be well,   Zettie Cooley, M.D   [facility] [phone]  *Sign up for MyChart for instant access to your health profile, labs, orders, upcoming appointments or to contact your provider with questions*  ===================================================================================

## 2019-11-11 NOTE — Progress Notes (Signed)
  Subjective  CC: Follow-up (ED)  Veronica [name] is a 28 y.o. female who presents today with the following problems:  Birth control  Patient presents for contraception counseling. Patient denies hx of blood clots, heart attack or stroke, uncontrolled blood pressure, breast cancer, liver disease, hep C. Patient is a non-smoker. UPT is neg today.  Patient's last menstrual period was 10/27/2019 (exact date).  Blood Pressure   At recent ED visit, patient's blood pressure was elevated.  Patient reports that she was very anxious at the time.  Her blood pressure in clinic today is appropriate.  It has been appropriate for the last 3 visits to outpatient.  Buspar No more dizziness when switching to 5 mg as told by Dr. Higinio Plan.  Feels very good when she is on the medication and feels like she is more focused.  Would like to continue using this medication.  Pertinent PM/FHx: Anxiety, obesity  Social Hx: Never smoker. Denies ETOH & drug use.  ROS: Pertinent ROS included in HPI. Objective  Physical Exam:  BP (!) 122/58   Pulse 92   Wt 241 lb 9.6 oz (109.6 kg)   LMP 10/27/2019 (Exact Date)   SpO2 100%   BMI 44.19 kg/m  General: Well-appearing African-American female, elevated BMI.  Assessment & Plan    Problem List Items Addressed This Visit    BMI 40.0-44.9, adult [facility])    Patient counseled on increasing exercise activity (at least 150 minutes a week of moderate intensity activity such as brisk walking and at least 2 days a week of activities that strengthen muscles) and on nutrition. She reports that she is working out 3x a week and has been eating healthy to try and get back in shape. Reviewed weights and patient has been losing weight. Hand out provided.  - lifestyle changes - follow up as needed        Contraceptive education    Patient counseled risks and benefits of different types of birth controls and ultimately accepts OCPs- ortho tricyclin. Reviewed risks and patient agrees  to return if she has any changes in her medical history (blood clots, heart attack or stroke, uncontrolled blood pressure, breast cancer, liver disease, hep C) that may increase her risk for negative outcomes. Patient counseled on side effects of birth control. UPT negative today. Patient to call or return if any concerns or questions. Handout provided.  Follow up in one year.         Relevant Medications   norgestimate-ethinyl estradiol (ORTHO-CYCLEN) 0.25-35 MG-MCG tablet    Other Visit Diagnoses    Menstrual problem    -  Primary   Relevant Orders   POCT urine pregnancy (Completed)     Hypertension  Patient's blood pressure was elevated at ED visit recently.  She reports that she was very anxious.  This is likely due to anxiety as her previous office blood pressures have been within normal limits.  She does have a family history of high blood pressure and will continue to monitor.  No further work-up or management at this time.   Wilber Oliphant, M.D.  1:32 PM 11/13/2019

## 2019-11-13 ENCOUNTER — Encounter: Payer: Self-pay | Admitting: Family Medicine

## 2019-11-13 NOTE — Assessment & Plan Note (Signed)
Patient counseled risks and benefits of different types of birth controls and ultimately accepts OCPs- ortho tricyclin. Reviewed risks and patient agrees to return if she has any changes in her medical history (blood clots, heart attack or stroke, uncontrolled blood pressure, breast cancer, liver disease, hep C) that may increase her risk for negative outcomes. Patient counseled on side effects of birth control. UPT negative today. Patient to call or return if any concerns or questions. Handout provided.  Follow up in one year.

## 2019-11-13 NOTE — Assessment & Plan Note (Signed)
Patient counseled on increasing exercise activity (at least 150 minutes a week of moderate intensity activity such as brisk walking and at least 2 days a week of activities that strengthen muscles) and on nutrition. She reports that she is working out 3x a week and has been eating healthy to try and get back in shape. Reviewed weights and patient has been losing weight. Hand out provided.  - lifestyle changes - follow up as needed

## 2019-11-16 ENCOUNTER — Encounter: Payer: Self-pay | Admitting: Family Medicine

## 2019-11-25 ENCOUNTER — Ambulatory Visit: Payer: Medicaid Other | Admitting: Nurse Practitioner

## 2019-12-15 ENCOUNTER — Encounter: Payer: Self-pay | Admitting: Family Medicine

## 2020-01-05 ENCOUNTER — Encounter: Payer: Self-pay | Admitting: Family Medicine

## 2020-02-07 ENCOUNTER — Encounter: Payer: Self-pay | Admitting: Family Medicine

## 2020-02-10 ENCOUNTER — Encounter: Payer: Self-pay | Admitting: Family Medicine

## 2020-02-10 ENCOUNTER — Ambulatory Visit (INDEPENDENT_AMBULATORY_CARE_PROVIDER_SITE_OTHER): Payer: BC Managed Care – PPO | Admitting: Family Medicine

## 2020-02-10 ENCOUNTER — Other Ambulatory Visit: Payer: Self-pay

## 2020-02-10 VITALS — BP 120/72 | HR 69 | Wt 247.0 lb

## 2020-02-10 DIAGNOSIS — R0602 Shortness of breath: Secondary | ICD-10-CM | POA: Diagnosis not present

## 2020-02-10 DIAGNOSIS — M549 Dorsalgia, unspecified: Secondary | ICD-10-CM

## 2020-02-10 DIAGNOSIS — F411 Generalized anxiety disorder: Secondary | ICD-10-CM | POA: Diagnosis not present

## 2020-02-10 NOTE — Patient Instructions (Signed)
Wonderful to see you! Try modifying the workouts you have been doing like we discussed. Additionally I have added low back stretches you can do in the morning and at night. You may use tylenol and or ibuprofen as needed for the pain, heat on your back for 20 minutes at a time.    [facility]  [phone]

## 2020-02-10 NOTE — Progress Notes (Signed)
    SUBJECTIVE:   CHIEF COMPLAINT / HPI: Back/shoulder pain  Veronica [name] is a 29 year old female presenting discussed the following:  Back/shoulder pain: Reports have been present for several weeks, general soreness on her bilateral upper back and lower back.  Worse when she wakes up in the morning and then after exercise routine.  Exercising 3-4 times a week.  Has been doing a lot of planks and leg lifts.  No particular movements make it worse, slouches a lot.  Denies any numbness, weakness, trauma, urinary/bladder incontinence, saddle anesthesia.  She also feels like when her back is hurting that it makes her feel short of breath, on days her back is not hurting she has no difficulty breathing. Tylenol and heat help a little.  At baseline, can get through a walking/running routine for over 30 minutes without concern.   Anxiety: Feels well managed on Zoloft and BuSpar.  Occasionally will feel anxious and has started a counting routine, usually will be able to count up to 6 and her anxiety will be better.  PERTINENT  PMH / PSH: Subclinical hypothyroidism, anxiety, elevated BMI with significant weight loss  OBJECTIVE:   BP 120/72   Pulse 69   Wt 247 lb (112 kg)   SpO2 99%   BMI 45.18 kg/m   General: Alert, NAD HEENT: NCAT, MMM Cardiac: RRR no m/g/r Lungs: Clear bilaterally, no increased WOB  Abdomen: soft, non-tender, non-distended, normoactive BS Msk: Moves all extremities spontaneously.  No deformities or asymmetries noted to thoracic/lumbar spine.  Tender to palpation of lumbar paraspinal musculature and bilateral upper trapezius.  Nontender to spinous processes.  Full ROM of thoracic/lumbar spine.  5/5 BLE strength.  Sensation to light touch intact. Ext: Warm, dry, 2+ distal pulses, no edema     ASSESSMENT/PLAN:   Back pain Within bilateral trapezius and lumbar spine, muscular in nature. In the setting of poor plank and leg lift form which likely has put increased stress on her  back.  Reviewed proper form during visit and provided with low back exercises/stretches.  Encouraged upper back massage, ice/heat, topical inflammatories, and Tylenol as needed.   GAD (generalized anxiety disorder) Well-controlled with Zoloft 100 mg and BuSpar 10 mg BID.   SOB (shortness of breath) Consistently atypical with varying etiologies--previously have attributed her SOB to anxiety, costochondritis, COVID-19 infection, uncontrolled GERD.  Awaiting pulmonology referral for PFTs as she has had a history of wheezing, however do note she has tried albuterol with minimal relief in the past. Will monitor, low concern for sinister etiology at this time.    Follow-up if back pain not improving or worsening, otherwise follow-up in 6 months for chronic conditions.  Allayne Stack, DO [facility]

## 2020-02-13 ENCOUNTER — Encounter: Payer: Self-pay | Admitting: Family Medicine

## 2020-02-13 DIAGNOSIS — M549 Dorsalgia, unspecified: Secondary | ICD-10-CM | POA: Insufficient documentation

## 2020-02-13 NOTE — Assessment & Plan Note (Signed)
Consistently atypical with varying etiologies--previously have attributed her SOB to anxiety, costochondritis, COVID-19 infection, uncontrolled GERD.  Awaiting pulmonology referral for PFTs as she has had a history of wheezing, however do note she has tried albuterol with minimal relief in the past. Will monitor, low concern for sinister etiology at this time.

## 2020-02-13 NOTE — Assessment & Plan Note (Signed)
Within bilateral trapezius and lumbar spine, muscular in nature. In the setting of poor plank and leg lift form which likely has put increased stress on her back.  Reviewed proper form during visit and provided with low back exercises/stretches.  Encouraged upper back massage, ice/heat, topical inflammatories, and Tylenol as needed.

## 2020-02-13 NOTE — Assessment & Plan Note (Signed)
Well-controlled with Zoloft 100 mg and BuSpar 10 mg BID.

## 2020-03-05 ENCOUNTER — Encounter: Payer: Self-pay | Admitting: Family Medicine

## 2020-03-11 ENCOUNTER — Encounter (HOSPITAL_COMMUNITY): Payer: Self-pay

## 2020-03-11 ENCOUNTER — Other Ambulatory Visit: Payer: Self-pay

## 2020-03-11 ENCOUNTER — Ambulatory Visit (HOSPITAL_COMMUNITY)
Admission: EM | Admit: 2020-03-11 | Discharge: 2020-03-11 | Disposition: A | Discharge status: Home / Self Care | Payer: BC Managed Care – PPO | Attending: Family Medicine | Admitting: Family Medicine

## 2020-03-11 DIAGNOSIS — K0889 Other specified disorders of teeth and supporting structures: Secondary | ICD-10-CM

## 2020-03-11 DIAGNOSIS — K047 Periapical abscess without sinus: Secondary | ICD-10-CM

## 2020-03-11 DIAGNOSIS — G43801 Other migraine, not intractable, with status migrainosus: Secondary | ICD-10-CM

## 2020-03-11 MED ORDER — DIPHENHYDRAMINE HCL 25 MG PO CAPS
ORAL_CAPSULE | ORAL | Status: AC
  Filled 2020-03-11: qty 1

## 2020-03-11 MED ORDER — DIPHENHYDRAMINE HCL 25 MG PO CAPS
25.0000 mg | ORAL_CAPSULE | Freq: Once | ORAL | Status: AC
  Administered 2020-03-11: 25 mg via ORAL

## 2020-03-11 MED ORDER — KETOROLAC TROMETHAMINE 30 MG/ML IJ SOLN
30.0000 mg | Freq: Once | INTRAMUSCULAR | Status: AC
  Administered 2020-03-11: 30 mg via INTRAMUSCULAR

## 2020-03-11 MED ORDER — CLINDAMYCIN HCL 150 MG PO CAPS: 150.0000 mg | ORAL_CAPSULE | Freq: Three times a day (TID) | ORAL | 0 refills | Status: AC

## 2020-03-11 MED ORDER — KETOROLAC TROMETHAMINE 30 MG/ML IJ SOLN
INTRAMUSCULAR | Status: AC
  Filled 2020-03-11: qty 1

## 2020-03-11 NOTE — Discharge Instructions (Signed)
I have called in some antibiotics to your pharmacy.  You are to take these 3 times a day for the next 7 days.  I also sent in prescription strength ibuprofen as well.  You should be feeling better in the next 24 hours.  I would have you follow-up with a dentist.  I have included information for low cost dental clinic, urgent tooth as well.  They can pull a tooth if needed and treat other dental issues.

## 2020-03-11 NOTE — ED Provider Notes (Signed)
[facility]    CSN: [id number] Arrival date & time: 03/11/20  1133      History   Chief Complaint Chief Complaint  Patient presents with  . Dental Pain    Right  . Headache  . Neck Pain    Right  . Otalgia    Right    HPI Veronica [name] is a 29 y.o. female.   Patient reports that she has a "bad tooth" on her right lower jaw.  She reports that she has been experiencing pain with this tooth for the last 5 days.  She reports that she has taken Tylenol with little relief.  She denies having a dentist.  Reports that the tooth has been broken for at least a year.  Also reports that she is having radiating pain to her jaw and left ear.  No drainage from the tooth, or the ear.  Does report that she is also experiencing migraine that she has taken Tylenol for with no relief.  Denies cough, shortness of breath, nausea, vomiting, diarrhea, body aches, chills, rash, fever, other symptoms.  ROS per HPI  The history is provided by the patient.    Past Medical History:  Diagnosis Date  . Abdominal pain 01/06/2012  . Acute tension headache 07/27/2019  . Anemia   . Ankle pain   . Birth control counseling 08/26/2011  . BMI 40.0-44.9, adult (HCC) 02/27/2011  . GERD (gastroesophageal reflux disease)   . Hiatal hernia   . Obesity (BMI 30-39.9)   . Secondary amenorrhea 03/25/2018    Patient Active Problem List   Diagnosis Date Noted  . Back pain 02/13/2020  . Hiatal hernia 11/02/2019  . SOB (shortness of breath) 11/02/2019  . Epigastric pain 09/13/2019  . Wheezing 08/27/2019  . BMI 45.0-49.9, adult (HCC) 08/19/2019  . COVID-19 virus detected 05/19/2019  . Subclinical hypothyroidism 05/19/2019  . Pre-diabetes 05/12/2019  . GAD (generalized anxiety disorder) 05/09/2019  . Seasonal allergies 03/25/2018  . Costochondritis 01/25/2018  . GERD (gastroesophageal reflux disease) 03/28/2011  . Anemia 02/27/2011    Past Surgical History:  Procedure Laterality Date  . CESAREAN  SECTION  09/02/2010   For macrosomia, but son was 7 lb 12 oz  . TONSILLECTOMY AND ADENOIDECTOMY      OB History   No obstetric history on file.      Home Medications    Prior to Admission medications   Medication Sig Start Date End Date Taking? Authorizing Provider  busPIRone (BUSPAR) 10 MG tablet Take 1 tablet (10 mg total) by mouth 2 (two) times daily. 10/27/19   Iva Boop, MD  clindamycin (CLEOCIN) 150 MG capsule Take 1 capsule (150 mg total) by mouth 3 (three) times daily for 7 days. 03/11/20 03/18/20  Moshe Cipro, NP  fluticasone (FLONASE) 50 MCG/ACT nasal spray Place 2 sprays into both nostrils daily as needed for allergies. 09/21/19   Allayne Stack, DO  hydrOXYzine (ATARAX/VISTARIL) 25 MG tablet Take 1 tablet (25 mg total) by mouth every 6 (six) hours as needed for anxiety. Patient not taking: Reported on 10/27/2019 09/09/19   Allayne Stack, DO  Melatonin 5 MG TABS Take 1 tablet (5 mg total) by mouth Nightly. 2-3 hours prior to bedtime Patient not taking: Reported on 10/27/2019 06/07/19   Allayne Stack, DO  norgestimate-ethinyl estradiol (ORTHO-CYCLEN) 0.25-35 MG-MCG tablet Take 1 tablet by mouth daily. 11/11/19   Melene Plan, MD  ondansetron (ZOFRAN) 4 MG tablet Take 1 tablet (4 mg  total) by mouth every 8 (eight) hours as needed for nausea or vomiting. Patient not taking: Reported on 10/27/2019 05/23/19   Rodell Perna A, [name]  pantoprazole (PROTONIX) 40 MG tablet Take 1 tablet (40 mg total) by mouth daily. 11/11/19   Wilber Oliphant, MD  potassium chloride SA (K-DUR,KLOR-CON) 20 MEQ tablet Take 1 tablet (20 mEq total) 2 (two) times daily by mouth. Patient not taking: Reported on 10/27/2019 10/19/17   Montine Circle, [name]  Pseudoeph-Doxylamine-DM-APAP (NYQUIL PO) Take 30 mLs by mouth at bedtime as needed (sleep).    [provider]  sertraline (ZOLOFT) 100 MG tablet Take 1 tablet (100 mg total) by mouth daily. 10/31/19   Patriciaann Clan, DO  sodium  chloride (OCEAN) 0.65 % SOLN nasal spray Place 1 spray into both nostrils as needed for congestion. 07/19/19   Patriciaann Clan, DO  famotidine (PEPCID) 20 MG tablet Take 1 tablet (20 mg total) by mouth 2 (two) times daily. Patient not taking: Reported on 08/25/2018 04/09/18 05/24/19  Tonette Bihari, MD    Family History Family History  Problem Relation Age of Onset  . Diabetes Mother   . Hypertension Mother   . Obesity Mother   . Heart attack Mother 10       Death  . Obesity Sister   . Osteochondroma Sister   . Diabetes Sister   . Hypertension Sister     Social History Social History   Tobacco Use  . Smoking status: Never Smoker  . Smokeless tobacco: Never Used  Substance Use Topics  . Alcohol use: No  . Drug use: No     Allergies   Morphine and related and Motrin [ibuprofen]   Review of Systems Review of Systems   Physical Exam Triage Vital Signs ED Triage Vitals  Enc Vitals Group     BP 03/11/20 1158 121/68     Pulse Rate 03/11/20 1158 70     Resp 03/11/20 1158 18     Temp 03/11/20 1158 98.4 F (36.9 C)     Temp Source 03/11/20 1158 Oral     SpO2 03/11/20 1158 100 %     Weight --      Height --      Head Circumference --      Peak Flow --      Pain Score 03/11/20 1200 8     Pain Loc --      Pain Edu? --      Excl. in [place]? --    No data found.  Updated Vital Signs BP 121/68 (BP Location: Left Arm)   Pulse 70   Temp 98.4 F (36.9 C) (Oral)   Resp 18   LMP 02/22/2020   SpO2 100%   Visual Acuity Right Eye Distance:   Left Eye Distance:   Bilateral Distance:    Right Eye Near:   Left Eye Near:    Bilateral Near:     Physical Exam Vitals and nursing note reviewed.  Constitutional:      General: She is not in acute distress.    Appearance: Normal appearance. She is well-developed. She is not ill-appearing.  HENT:     Head: Normocephalic and atraumatic.     Right Ear: Tympanic membrane normal.     Left Ear: Tympanic membrane normal.       Nose: Nose normal.     Mouth/Throat:     Mouth: Mucous membranes are moist.     Pharynx: Oropharynx is clear.  Comments: Area of broken tooth is seen in the blue X above. Eyes:     Extraocular Movements: Extraocular movements intact.     Conjunctiva/sclera: Conjunctivae normal.     Pupils: Pupils are equal, round, and reactive to light.  Cardiovascular:     Rate and Rhythm: Normal rate and regular rhythm.     Heart sounds: Normal heart sounds. No murmur.  Pulmonary:     Effort: Pulmonary effort is normal. No respiratory distress.     Breath sounds: Normal breath sounds. No stridor. No wheezing, rhonchi or rales.  Chest:     Chest wall: No tenderness.  Abdominal:     General: Bowel sounds are normal.     Palpations: Abdomen is soft.     Tenderness: There is no abdominal tenderness.  Musculoskeletal:        General: Normal range of motion.     Cervical back: Normal range of motion and neck supple.  Lymphadenopathy:     Cervical: Cervical adenopathy (Left side) present.  Skin:    General: Skin is warm and dry.     Capillary Refill: Capillary refill takes less than 2 seconds.  Neurological:     General: No focal deficit present.     Mental Status: She is alert and oriented to person, place, and time.  Psychiatric:        Mood and Affect: Mood normal.        Behavior: Behavior normal.      UC Treatments / Results  Labs (all labs ordered are listed, but only abnormal results are displayed) Labs Reviewed - No data to display  EKG   Radiology No results found.  Procedures Procedures (including critical care time)  Medications Ordered in UC Medications  ketorolac (TORADOL) 30 MG/ML injection 30 mg (has no administration in time range)  diphenhydrAMINE (BENADRYL) capsule 25 mg (has no administration in time range)    Initial Impression / Assessment and Plan / UC Course  I have reviewed the triage vital signs and the nursing notes.  Pertinent labs &  imaging results that were available during my care of the patient were reviewed by me and considered in my medical decision making (see chart for details).     Dental pain/dental abscess: For the last 5 days she has been experiencing pain in the left lower jaw that radiates to left ear.  Bilateral TMs pearly gray with no effusions.  Clindamycin 150 3 times daily x7 days sent to patient's pharmacy.  Instructed patient on how to take this medication.  Also discussed with patient the need to get established with a dentist, and possibly have this tooth pulled. Instructed patient that if she is having pain and inflammation in the next 2 days, she needs to follow-up with a dentist.  Patient is agreeable to treatment plan.  Migraine: Patient has had a headache for the last 3 days, has taken Tylenol with no relief.  Has history of migraines, will give 30 mg Toradol IM in office today, along with 25 mg Benadryl capsule with migraine cocktail.  Patient instructed that if she is having the worst headache of her life, that she needs to follow-up in the ER. Final Clinical Impressions(s) / UC Diagnoses   Final diagnoses:  Dental abscess  Pain, dental  Other migraine with status migrainosus, not intractable     Discharge Instructions     I have called in some antibiotics to your pharmacy.  You are to take these 3 times a  day for the next 7 days.  I also sent in prescription strength ibuprofen as well.  You should be feeling better in the next 24 hours.  I would have you follow-up with a dentist.  I have included information for low cost dental clinic, urgent tooth as well.  They can pull a tooth if needed and treat other dental issues.    ED Prescriptions    Medication Sig Dispense Auth. Provider   clindamycin (CLEOCIN) 150 MG capsule Take 1 capsule (150 mg total) by mouth 3 (three) times daily for 7 days. 21 capsule Moshe Cipro, NP     PDMP not reviewed this encounter.   Moshe Cipro,  NP 03/11/20 1218

## 2020-03-11 NOTE — ED Triage Notes (Signed)
Pt presents with right side dental pain, headache, right side neck pain and right ear pain X 5 days.

## 2020-03-16 ENCOUNTER — Ambulatory Visit (INDEPENDENT_AMBULATORY_CARE_PROVIDER_SITE_OTHER): Payer: BC Managed Care – PPO | Admitting: Emergency Medicine

## 2020-03-16 ENCOUNTER — Ambulatory Visit (INDEPENDENT_AMBULATORY_CARE_PROVIDER_SITE_OTHER): Payer: BC Managed Care – PPO

## 2020-03-16 ENCOUNTER — Encounter: Payer: Self-pay | Admitting: Emergency Medicine

## 2020-03-16 ENCOUNTER — Other Ambulatory Visit: Payer: Self-pay

## 2020-03-16 VITALS — BP 132/70 | HR 79 | Ht 62.0 in | Wt 250.0 lb

## 2020-03-16 DIAGNOSIS — R0602 Shortness of breath: Secondary | ICD-10-CM

## 2020-03-16 DIAGNOSIS — R0600 Dyspnea, unspecified: Secondary | ICD-10-CM | POA: Diagnosis not present

## 2020-03-16 MED ORDER — PANTOPRAZOLE SODIUM 40 MG PO TBEC: 40.0000 mg | DELAYED_RELEASE_TABLET | Freq: Two times a day (BID) | ORAL | 0 refills | Status: DC

## 2020-03-16 NOTE — Progress Notes (Signed)
Subjective:    Patient ID: Veronica [name], female    DOB: 17-Mar-1991, 29 y.o.   MRN: [id number]  HPI 29 year old woman with history of obesity, never smoker.  She has chronic anxiety, esophageal reflux and a hiatal hernia, tension headaches, seasonal allergies, otherwise minimal past medical history.  She had COVID-19 in June 2020, chest x-ray at the time and subsequently (September) clear. Presented with fever, chills, emesis, poor appetite, loss of taste and smells. Much of this improved.   She has developed increased dyspnea, happens at rest but not really with exertion or exercise, sometimes happens when she is supine. She started to hear some wheeze about 3 months ago, happens when supine or w exertion. She has lost 60-70 lbs unintentionally.  She is an active person, goes to the gym. She has GERD, significant breakthrough even on protonix. She is having a lot of allergic rhinitis right now - a lot of sneezing. She is not taking her flonase  Radiology reviewed, including chest x-rays from 05/24/2019, 05/27/2019, 08/13/2019, 08/23/2019--all of these were clear without any evidence of interstitial disease, infiltrate, effusion. ED notes from June 2020, September 2020 reviewed.   Review of Systems As per HPI  Past Medical History:  Diagnosis Date  . Abdominal pain 01/06/2012  . Acute tension headache 07/27/2019  . Anemia   . Ankle pain   . Birth control counseling 08/26/2011  . BMI 40.0-44.9, adult (HCC) 02/27/2011  . GERD (gastroesophageal reflux disease)   . Hiatal hernia   . Obesity (BMI 30-39.9)   . Secondary amenorrhea 03/25/2018     Family History  Problem Relation Age of Onset  . Diabetes Mother   . Hypertension Mother   . Obesity Mother   . Heart attack Mother 10       Death  . Obesity Sister   . Osteochondroma Sister   . Diabetes Sister   . Hypertension Sister      Social History   Socioeconomic History  . Marital status: Single    Spouse name: Not on file  .  Number of children: Not on file  . Years of education: Not on file  . Highest education level: Not on file  Occupational History  . Occupation: Designer, fashion/clothing: UNEMPLOYED    Comment: sales  Tobacco Use  . Smoking status: Never Smoker  . Smokeless tobacco: Never Used  Substance and Sexual Activity  . Alcohol use: No  . Drug use: No  . Sexual activity: Yes    Birth control/protection: Injection  Other Topics Concern  . Not on file  Social History Narrative   Lives son ([name] 09/02/2010 ), sister Martie Lee Hupfer 10/28/94), and mom (Mary Ellen-Gillis) and mom's fiance.    Attending [employer] to obtain GED.   Mom's fiance smokes outside house.    Pet: dog               Social Determinants of Health   Financial Resource Strain:   . Difficulty of Paying Living Expenses:   Food Insecurity:   . Worried About Programme researcher, broadcasting/film/video in the Last Year:   . Barista in the Last Year:   Transportation Needs:   . Freight forwarder (Medical):   Marland Kitchen Lack of Transportation (Non-Medical):   Physical Activity:   . Days of Exercise per Week:   . Minutes of Exercise per Session:   Stress:   . Feeling of Stress :   Social Connections:   .  Frequency of Communication with Friends and Family:   . Frequency of Social Gatherings with Friends and Family:   . Attends Religious Services:   . Active Member of Clubs or Organizations:   . Attends Banker Meetings:   Marland Kitchen Marital Status:   Intimate Partner Violence:   . Fear of Current or Ex-Partner:   . Emotionally Abused:   Marland Kitchen Physically Abused:   . 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  No military Some exposure to cleaning solutions No other exposures.   Allergies  Allergen Reactions  . Morphine And Related Other (See Comments)    Causes "chest to be heavy"  . Motrin [Ibuprofen]     Was told to not take this because it would flare  stomach issues     Outpatient Medications Prior to Visit  Medication Sig Dispense Refill  . busPIRone (BUSPAR) 10 MG tablet Take 1 tablet (10 mg total) by mouth 2 (two) times daily. 180 tablet 1  . clindamycin (CLEOCIN) 150 MG capsule Take 1 capsule (150 mg total) by mouth 3 (three) times daily for 7 days. 21 capsule 0  . fluticasone (FLONASE) 50 MCG/ACT nasal spray Place 2 sprays into both nostrils daily as needed for allergies. 16 g 1  . norgestimate-ethinyl estradiol (ORTHO-CYCLEN) 0.25-35 MG-MCG tablet Take 1 tablet by mouth daily. 1 Package 11  . pantoprazole (PROTONIX) 40 MG tablet Take 1 tablet (40 mg total) by mouth daily. 30 tablet 3  . Pseudoeph-Doxylamine-DM-APAP (NYQUIL PO) Take 30 mLs by mouth at bedtime as needed (sleep).    . sertraline (ZOLOFT) 100 MG tablet Take 1 tablet (100 mg total) by mouth daily. 30 tablet 3  . sodium chloride (OCEAN) 0.65 % SOLN nasal spray Place 1 spray into both nostrils as needed for congestion. 15 mL 1  . hydrOXYzine (ATARAX/VISTARIL) 25 MG tablet Take 1 tablet (25 mg total) by mouth every 6 (six) hours as needed for anxiety. (Patient not taking: Reported on 10/27/2019) 15 tablet 1  . Melatonin 5 MG TABS Take 1 tablet (5 mg total) by mouth Nightly. 2-3 hours prior to bedtime (Patient not taking: Reported on 10/27/2019) 30 tablet 1  . ondansetron (ZOFRAN) 4 MG tablet Take 1 tablet (4 mg total) by mouth every 8 (eight) hours as needed for nausea or vomiting. (Patient not taking: Reported on 10/27/2019) 10 tablet 0  . potassium chloride SA (K-DUR,KLOR-CON) 20 MEQ tablet Take 1 tablet (20 mEq total) 2 (two) times daily by mouth. (Patient not taking: Reported on 10/27/2019) 6 tablet 0   No facility-administered medications prior to visit.        Objective:   Physical Exam Vitals:   03/16/20 1415  BP: 132/70  Pulse: 79  SpO2: 99%  Weight: 250 lb (113.4 kg)  Height: 5\' 2"  (1.575 m)   Gen: Pleasant overweight woman, in no distress,  normal  affect  ENT: No lesions,  mouth clear, tongue piercings, oropharynx clear, no postnasal drip  Neck: No JVD, no stridor  Lungs: No use of accessory muscles, no crackles or wheezing on normal respiration, no wheeze on forced expiration  Cardiovascular: RRR, heart sounds normal, no murmur or gallops, no peripheral edema  Musculoskeletal: No deformities, no cyanosis or clubbing  Neuro: alert, awake, non focal  Skin: Warm, no lesions or rash      Assessment & Plan:  Dyspnea Seems to happen more at rest with  a possible restrictive component.  Can be associated with wheeze and or upper airway noise as well.  She needs pulmonary function testing to evaluate for possible obstructive lung disease.  She also needs a repeat chest x-ray to ensure no evolving interstitial disease post COVID-19 last year.  Consider contribution of upper airway obstruction given her description of her symptoms.  We will empirically increase her pantoprazole, and every day allergy regimen to see if this impacts her possible upper airway noise, wheeze.  We will check a chest x-ray today. We will perform pulmonary function testing at your next office visit. Please increase your pantoprazole (Protonix) to 40 mg twice a day until next visit.  Take this 1 hour around food. Start using your fluticasone nasal spray, 2 sprays each nostril once daily every day until next visit. Start loratadine (Claritin) 10 mg once daily until next visit. Follow with Dr Lamonte Sakai in 1 month with PFTs on the same day.  Baltazar Apo, MD, PhD 03/16/2020, 2:50 PM [facility] [phone] or if no answer [phone]

## 2020-03-16 NOTE — Assessment & Plan Note (Signed)
Seems to happen more at rest with a possible restrictive component.  Can be associated with wheeze and or upper airway noise as well.  She needs pulmonary function testing to evaluate for possible obstructive lung disease.  She also needs a repeat chest x-ray to ensure no evolving interstitial disease post COVID-19 last year.  Consider contribution of upper airway obstruction given her description of her symptoms.  We will empirically increase her pantoprazole, and every day allergy regimen to see if this impacts her possible upper airway noise, wheeze.  We will check a chest x-ray today. We will perform pulmonary function testing at your next office visit. Please increase your pantoprazole (Protonix) to 40 mg twice a day until next visit.  Take this 1 hour around food. Start using your fluticasone nasal spray, 2 sprays each nostril once daily every day until next visit. Start loratadine (Claritin) 10 mg once daily until next visit. Follow with Dr Delton Coombes in 1 month with PFTs on the same day.

## 2020-03-16 NOTE — Patient Instructions (Addendum)
We will check a chest x-ray today. We will perform pulmonary function testing at your next office visit. Please increase your pantoprazole (Protonix) to 40 mg twice a day until next visit.  Take this 1 hour around food. Start using your fluticasone nasal spray, 2 sprays each nostril once daily every day until next visit. Start loratadine (Claritin) 10 mg once daily until next visit. Follow with Dr Delton Coombes in 1 month with PFTs on the same day.

## 2020-03-22 ENCOUNTER — Other Ambulatory Visit: Payer: Self-pay | Admitting: Family Medicine

## 2020-03-22 DIAGNOSIS — F411 Generalized anxiety disorder: Secondary | ICD-10-CM

## 2020-04-03 ENCOUNTER — Other Ambulatory Visit (HOSPITAL_COMMUNITY)
Admission: RE | Admit: 2020-04-03 | Discharge: 2020-04-03 | Disposition: A | Discharge status: Home / Self Care | Payer: BC Managed Care – PPO | Source: Ambulatory Visit | Attending: Emergency Medicine | Admitting: Emergency Medicine

## 2020-04-03 DIAGNOSIS — Z01812 Encounter for preprocedural laboratory examination: Secondary | ICD-10-CM | POA: Diagnosis not present

## 2020-04-03 DIAGNOSIS — Z20822 Contact with and (suspected) exposure to covid-19: Secondary | ICD-10-CM | POA: Diagnosis not present

## 2020-04-03 LAB — SARS CORONAVIRUS 2 (TAT 6-24 HRS): SARS Coronavirus 2: NEGATIVE

## 2020-04-06 ENCOUNTER — Other Ambulatory Visit: Payer: Self-pay

## 2020-04-06 ENCOUNTER — Ambulatory Visit (INDEPENDENT_AMBULATORY_CARE_PROVIDER_SITE_OTHER): Payer: BC Managed Care – PPO | Admitting: Emergency Medicine

## 2020-04-06 ENCOUNTER — Encounter: Payer: Self-pay | Admitting: Emergency Medicine

## 2020-04-06 DIAGNOSIS — K449 Diaphragmatic hernia without obstruction or gangrene: Secondary | ICD-10-CM

## 2020-04-06 DIAGNOSIS — R0602 Shortness of breath: Secondary | ICD-10-CM

## 2020-04-06 DIAGNOSIS — J302 Other seasonal allergic rhinitis: Secondary | ICD-10-CM

## 2020-04-06 LAB — PULMONARY FUNCTION TEST
DL/VA % pred: 149 %
DL/VA: 7.05 ml/min/mmHg/L
DLCO cor % pred: 139 %
DLCO cor: 29.1 ml/min/mmHg
DLCO unc % pred: 139 %
DLCO unc: 29.1 ml/min/mmHg
FEF 25-75 Post: 2.69 L/sec
FEF 25-75 Pre: 2.06 L/sec
FEF2575-%Change-Post: 30 %
FEF2575-%Pred-Post: 84 %
FEF2575-%Pred-Pre: 64 %
FEV1-%Change-Post: 10 %
FEV1-%Pred-Post: 94 %
FEV1-%Pred-Pre: 85 %
FEV1-Post: 2.47 L
FEV1-Pre: 2.24 L
FEV1FVC-%Change-Post: 5 %
FEV1FVC-%Pred-Pre: 90 %
FEV6-%Change-Post: 3 %
FEV6-%Pred-Post: 98 %
FEV6-%Pred-Pre: 94 %
FEV6-Post: 2.94 L
FEV6-Pre: 2.83 L
FEV6FVC-%Pred-Post: 100 %
FEV6FVC-%Pred-Pre: 100 %
FVC-%Change-Post: 4 %
FVC-%Pred-Post: 99 %
FVC-%Pred-Pre: 95 %
FVC-Post: 3.01 L
FVC-Pre: 2.89 L
Post FEV1/FVC ratio: 82 %
Post FEV6/FVC ratio: 100 %
Pre FEV1/FVC ratio: 78 %
Pre FEV6/FVC Ratio: 100 %
RV % pred: 102 %
RV: 1.26 L
TLC % pred: 91 %
TLC: 4.35 L

## 2020-04-06 MED ORDER — ALBUTEROL SULFATE HFA 108 (90 BASE) MCG/ACT IN AERS: 2.0000 | INHALATION_SPRAY | Freq: Four times a day (QID) | RESPIRATORY_TRACT | 5 refills | Status: DC | PRN

## 2020-04-06 NOTE — Addendum Note (Signed)
Addended by: Delrae Rend on: 04/06/2020 04:24 PM   Modules accepted: Orders

## 2020-04-06 NOTE — Patient Instructions (Signed)
Your pulmonary function testing showed some evidence for mild asthma. We will try starting albuterol, use 2 puffs up to every 4 hours if needed for shortness of breath, chest tightness, wheezing.  You could also try treating about 15 minutes before exercise to see if it makes your exertion easier. Continue your loratadine 10 mg once daily and your fluticasone nasal spray 2 sprays each nostril once daily. Continue Protonix 40 mg twice a day for now.  If your reflux and hiatal hernia remain bothersome we may decide to refer you back to see Dr. Leone Payor with gastroenterology. Follow with Dr. Delton Coombes in 3 months to discuss how you have been doing on the new medication.

## 2020-04-06 NOTE — Assessment & Plan Note (Signed)
Appears to be benefiting from fluticasone nasal spray and loratadine.  Plan to continue these.

## 2020-04-06 NOTE — Assessment & Plan Note (Signed)
I increased her pantoprazole to 40 mg twice a day at her last visit.  We will continue this dose for now.  She may need referral back to gastroenterology

## 2020-04-06 NOTE — Progress Notes (Signed)
Subjective:    Patient ID: Veronica [name], female    DOB: [birth year], 29 y.o.   MRN: [id number]  HPI 29 year old woman with history of obesity, never smoker.  She has chronic anxiety, esophageal reflux and a hiatal hernia, tension headaches, seasonal allergies, otherwise minimal past medical history.  She had COVID-19 in June 2020, chest x-ray at the time and subsequently (September) clear. Presented with fever, chills, emesis, poor appetite, loss of taste and smells. Much of this improved.   She has developed increased dyspnea, happens at rest but not really with exertion or exercise, sometimes happens when she is supine. She started to hear some wheeze about 3 months ago, happens when supine or w exertion. She has lost 60-70 lbs unintentionally.  She is an active person, goes to the gym. She has GERD, significant breakthrough even on protonix. She is having a lot of allergic rhinitis right now - a lot of sneezing. She is not taking her flonase  Radiology reviewed, including chest x-rays from 05/24/2019, 05/27/2019, 08/13/2019, 08/23/2019--all of these were clear without any evidence of interstitial disease, infiltrate, effusion. ED notes from June 2020, September 2020 reviewed.  ROV 04/06/20 --this is a follow-up visit for dyspnea and upper airway noise.  She had COVID-19 in June 2020 as above.  She has breakthrough GERD so increase her pantoprazole to 40 mg twice a day last visit, also added loratadine and fluticasone nasal spray given her upper airway symptoms. She is still having some breakthrough GERD. The allergy regimen did help her some.    Pulmonary function testing done today reviewed by me shows grossly normal airflows but with a borderline positive bronchodilator response and a significant improvement in her FEF 25-75%.  Her lung volumes are normal and her diffusion capacity is elevated.  Her chest x-ray from 03/17/2020 was reviewed by me, shows some mild central airway thickening, question  acute inflammatory change or reactive airways.  No consolidation, effusion, infiltrates.   Review of Systems As per HPI  Past Medical History:  Diagnosis Date  . Abdominal pain 01/06/2012  . Acute tension headache 07/27/2019  . Anemia   . Ankle pain   . Birth control counseling 08/26/2011  . BMI 40.0-44.9, adult (HCC) 02/27/2011  . GERD (gastroesophageal reflux disease)   . Hiatal hernia   . Obesity (BMI 30-39.9)   . Secondary amenorrhea 03/25/2018     Family History  Problem Relation Age of Onset  . Diabetes Mother   . Hypertension Mother   . Obesity Mother   . Heart attack Mother 66       Death  . Obesity Sister   . Osteochondroma Sister   . Diabetes Sister   . Hypertension Sister      Social History   Socioeconomic History  . Marital status: Single    Spouse name: Not on file  . Number of children: Not on file  . Years of education: Not on file  . Highest education level: Not on file  Occupational History  . Occupation: Designer, fashion/clothing: UNEMPLOYED    Comment: sales  Tobacco Use  . Smoking status: Never Smoker  . Smokeless tobacco: Never Used  Substance and Sexual Activity  . Alcohol use: No  . Drug use: No  . Sexual activity: Yes    Birth control/protection: Injection  Other Topics Concern  . Not on file  Social History Narrative   Lives son ([name] 09/02/2010 ), sister Martie Lee Keady 10/28/94), and mom Corrie Dandy  Baillargeon-Gillis) and mom's fiance.    Attending [employer] to obtain GED.   Mom's fiance smokes outside house.    Pet: dog               Social Determinants of Health   Financial Resource Strain:   . Difficulty of Paying Living Expenses:   Food Insecurity:   . Worried About Programme researcher, broadcasting/film/video in the Last Year:   . Barista in the Last Year:   Transportation Needs:   . Freight forwarder (Medical):   Marland Kitchen Lack of Transportation (Non-Medical):   Physical Activity:   . Days of Exercise per Week:   . Minutes of Exercise per Session:    Stress:   . Feeling of Stress :   Social Connections:   . Frequency of Communication with Friends and Family:   . Frequency of Social Gatherings with Friends and Family:   . Attends Religious Services:   . Active Member of Clubs or Organizations:   . Attends Banker Meetings:   Marland Kitchen Marital Status:   Intimate Partner Violence:   . Fear of Current or Ex-Partner:   . Emotionally Abused:   Marland Kitchen Physically Abused:   . 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  No military Some exposure to cleaning solutions No other exposures.   Allergies  Allergen Reactions  . Morphine And Related Other (See Comments)    Causes "chest to be heavy"  . Motrin [Ibuprofen]     Was told to not take this because it would flare stomach issues     Outpatient Medications Prior to Visit  Medication Sig Dispense Refill  . busPIRone (BUSPAR) 10 MG tablet Take 1 tablet (10 mg total) by mouth 2 (two) times daily. 180 tablet 1  . fluticasone (FLONASE) 50 MCG/ACT nasal spray Place 2 sprays into both nostrils daily as needed for allergies. 16 g 1  . norgestimate-ethinyl estradiol (ORTHO-CYCLEN) 0.25-35 MG-MCG tablet Take 1 tablet by mouth daily. 1 Package 11  . pantoprazole (PROTONIX) 40 MG tablet Take 1 tablet (40 mg total) by mouth daily. 30 tablet 3  . pantoprazole (PROTONIX) 40 MG tablet Take 1 tablet (40 mg total) by mouth 2 (two) times daily. 60 tablet 0  . Pseudoeph-Doxylamine-DM-APAP (NYQUIL PO) Take 30 mLs by mouth at bedtime as needed (sleep).    . sertraline (ZOLOFT) 100 MG tablet Take 1 tablet by mouth once daily 30 tablet 3  . sodium chloride (OCEAN) 0.65 % SOLN nasal spray Place 1 spray into both nostrils as needed for congestion. 15 mL 1   No facility-administered medications prior to visit.        Objective:   Physical Exam Vitals:   04/06/20 1525 04/06/20 1528  BP: 106/60 106/60  Pulse:  68  Temp:   98.4 F (36.9 C)  TempSrc:  Temporal  SpO2: 100% 100%  Weight:  252 lb (114.3 kg)  Height:  5\' 2"  (1.575 m)   Gen: Pleasant overweight woman, in no distress,  normal affect  ENT: No lesions,  mouth clear, tongue piercings, oropharynx clear, no postnasal drip  Neck: No JVD, no stridor  Lungs: No use of accessory muscles, no crackles or wheezing on normal respiration, no wheeze on forced expiration  Cardiovascular: RRR, heart sounds normal, no murmur or gallops, no peripheral edema  Musculoskeletal: No deformities, no cyanosis or clubbing  Neuro: alert, awake,  non focal  Skin: Warm, no lesions or rash      Assessment & Plan:  Dyspnea Multifactorial with contribution of her weight and also some upper airway irritation syndrome.  Her pulmonary function testing does show subtle evidence for possible mild asthma.  I think would be reasonable to do a trial of albuterol to see if this is beneficial.  Unclear to me whether this may have been brought out by her recent Covid diagnosis.  I will follow up with her in 3 months to see if the albuterol has been beneficial.  Hiatal hernia I increased her pantoprazole to 40 mg twice a day at her last visit.  We will continue this dose for now.  She may need referral back to gastroenterology  Seasonal allergies Appears to be benefiting from fluticasone nasal spray and loratadine.  Plan to continue these.  Baltazar Apo, MD, PhD 04/06/2020, 3:54 PM [facility] [phone] or if no answer [phone]

## 2020-04-06 NOTE — Progress Notes (Signed)
Full PFT performed today. °

## 2020-04-06 NOTE — Assessment & Plan Note (Signed)
Multifactorial with contribution of her weight and also some upper airway irritation syndrome.  Her pulmonary function testing does show subtle evidence for possible mild asthma.  I think would be reasonable to do a trial of albuterol to see if this is beneficial.  Unclear to me whether this may have been brought out by her recent Covid diagnosis.  I will follow up with her in 3 months to see if the albuterol has been beneficial.

## 2020-04-10 ENCOUNTER — Other Ambulatory Visit: Payer: Self-pay

## 2020-04-10 DIAGNOSIS — J302 Other seasonal allergic rhinitis: Secondary | ICD-10-CM

## 2020-04-10 DIAGNOSIS — F411 Generalized anxiety disorder: Secondary | ICD-10-CM

## 2020-04-10 MED ORDER — BUSPIRONE HCL 10 MG PO TABS: 10.0000 mg | ORAL_TABLET | Freq: Two times a day (BID) | ORAL | 1 refills | Status: DC

## 2020-04-10 MED ORDER — SERTRALINE HCL 100 MG PO TABS: 100.0000 mg | ORAL_TABLET | Freq: Every day | ORAL | 3 refills | Status: DC

## 2020-04-10 MED ORDER — PANTOPRAZOLE SODIUM 40 MG PO TBEC: 40.0000 mg | DELAYED_RELEASE_TABLET | Freq: Two times a day (BID) | ORAL | 3 refills | Status: DC

## 2020-04-10 MED ORDER — FLUTICASONE PROPIONATE 50 MCG/ACT NA SUSP: 2.0000 | Freq: Every day | NASAL | 1 refills | Status: DC | PRN

## 2020-05-22 ENCOUNTER — Encounter: Payer: Self-pay | Admitting: Family Medicine

## 2020-05-31 ENCOUNTER — Encounter (HOSPITAL_COMMUNITY): Payer: Self-pay

## 2020-05-31 ENCOUNTER — Ambulatory Visit (HOSPITAL_COMMUNITY)
Admission: EM | Admit: 2020-05-31 | Discharge: 2020-05-31 | Disposition: A | Discharge status: Home / Self Care | Payer: BC Managed Care – PPO | Attending: Family Medicine | Admitting: Family Medicine

## 2020-05-31 ENCOUNTER — Other Ambulatory Visit: Payer: Self-pay

## 2020-05-31 ENCOUNTER — Encounter: Payer: Self-pay | Admitting: Family Medicine

## 2020-05-31 DIAGNOSIS — J069 Acute upper respiratory infection, unspecified: Secondary | ICD-10-CM | POA: Insufficient documentation

## 2020-05-31 DIAGNOSIS — Z20822 Contact with and (suspected) exposure to covid-19: Secondary | ICD-10-CM | POA: Insufficient documentation

## 2020-05-31 DIAGNOSIS — J029 Acute pharyngitis, unspecified: Secondary | ICD-10-CM | POA: Diagnosis present

## 2020-05-31 HISTORY — DX: Unspecified asthma, uncomplicated: J45.909

## 2020-05-31 LAB — SARS CORONAVIRUS 2 (TAT 6-24 HRS): SARS Coronavirus 2: NEGATIVE

## 2020-05-31 NOTE — ED Provider Notes (Signed)
[facility]    CSN: [id number] Arrival date & time: 05/31/20  1243      History   Chief Complaint Chief Complaint  Patient presents with  . Sore Throat  . Cough    HPI Veronica [name] is a 29 y.o. female.   The history is provided by the patient. No language interpreter was used.  Sore Throat This is a new problem. The current episode started 2 days ago. The problem occurs constantly. The problem has been gradually worsening. Nothing aggravates the symptoms. Nothing relieves the symptoms. She has tried nothing for the symptoms. The treatment provided no relief.  Cough Pt reports ear discomfort and a sore throat.  Pt here with son who also has congestion   Past Medical History:  Diagnosis Date  . Abdominal pain 01/06/2012  . Acute tension headache 07/27/2019  . Anemia   . Ankle pain   . Asthma   . Birth control counseling 08/26/2011  . BMI 40.0-44.9, adult (HCC) 02/27/2011  . GERD (gastroesophageal reflux disease)   . Hiatal hernia   . Obesity (BMI 30-39.9)   . Secondary amenorrhea 03/25/2018    Patient Active Problem List   Diagnosis Date Noted  . Back pain 02/13/2020  . Hiatal hernia 11/02/2019  . Dyspnea 11/02/2019  . Epigastric pain 09/13/2019  . Wheezing 08/27/2019  . BMI 45.0-49.9, adult (HCC) 08/19/2019  . COVID-19 virus detected 05/19/2019  . Subclinical hypothyroidism 05/19/2019  . Pre-diabetes 05/12/2019  . GAD (generalized anxiety disorder) 05/09/2019  . Seasonal allergies 03/25/2018  . Costochondritis 01/25/2018  . GERD (gastroesophageal reflux disease) 03/28/2011  . Anemia 02/27/2011    Past Surgical History:  Procedure Laterality Date  . CESAREAN SECTION  09/02/2010   For macrosomia, but son was 7 lb 12 oz  . TONSILLECTOMY AND ADENOIDECTOMY      OB History   No obstetric history on file.      Home Medications    Prior to Admission medications   Medication Sig Start Date End Date Taking? Authorizing Provider  albuterol  (VENTOLIN HFA) 108 (90 Base) MCG/ACT inhaler Inhale 2 puffs into the lungs every 6 (six) hours as needed for wheezing or shortness of breath. 04/06/20   Leslye Peer, MD  busPIRone (BUSPAR) 10 MG tablet Take 1 tablet (10 mg total) by mouth 2 (two) times daily. 04/10/20   Allayne Stack, DO  fluticasone (FLONASE) 50 MCG/ACT nasal spray Place 2 sprays into both nostrils daily as needed for allergies. 04/10/20   Allayne Stack, DO  norgestimate-ethinyl estradiol (ORTHO-CYCLEN) 0.25-35 MG-MCG tablet Take 1 tablet by mouth daily. 11/11/19   Melene Plan, MD  pantoprazole (PROTONIX) 40 MG tablet Take 1 tablet (40 mg total) by mouth daily. 11/11/19   Melene Plan, MD  pantoprazole (PROTONIX) 40 MG tablet Take 1 tablet (40 mg total) by mouth 2 (two) times daily. 04/10/20   Allayne Stack, DO  Pseudoeph-Doxylamine-DM-APAP (NYQUIL PO) Take 30 mLs by mouth at bedtime as needed (sleep).    [provider]  sertraline (ZOLOFT) 100 MG tablet Take 1 tablet (100 mg total) by mouth daily. 04/10/20   Allayne Stack, DO  sodium chloride (OCEAN) 0.65 % SOLN nasal spray Place 1 spray into both nostrils as needed for congestion. 07/19/19   Allayne Stack, DO  famotidine (PEPCID) 20 MG tablet Take 1 tablet (20 mg total) by mouth 2 (two) times daily. Patient not taking: Reported on 08/25/2018 04/09/18 05/24/19  [name]  Gust Brooms, MD    Family History Family History  Problem Relation Age of Onset  . Diabetes Mother   . Hypertension Mother   . Obesity Mother   . Heart attack Mother 50       Death  . Obesity Sister   . Osteochondroma Sister   . Diabetes Sister   . Hypertension Sister     Social History Social History   Tobacco Use  . Smoking status: Never Smoker  . Smokeless tobacco: Never Used  Vaping Use  . Vaping Use: Never used  Substance Use Topics  . Alcohol use: No  . Drug use: No     Allergies   Morphine and related and Motrin [ibuprofen]   Review of Systems Review of  Systems  Respiratory: Positive for cough.   All other systems reviewed and are negative.    Physical Exam Triage Vital Signs ED Triage Vitals  Enc Vitals Group     BP 05/31/20 1347 104/60     Pulse Rate 05/31/20 1347 65     Resp 05/31/20 1347 17     Temp 05/31/20 1347 98.3 F (36.8 C)     Temp src --      SpO2 05/31/20 1347 99 %     Weight --      Height --      Head Circumference --      Peak Flow --      Pain Score 05/31/20 1346 0     Pain Loc --      Pain Edu? --      Excl. in GC? --    No data found.  Updated Vital Signs BP 104/60   Pulse 65   Temp 98.3 F (36.8 C)   Resp 17   SpO2 99%   Visual Acuity Right Eye Distance:   Left Eye Distance:   Bilateral Distance:    Right Eye Near:   Left Eye Near:    Bilateral Near:     Physical Exam Vitals and nursing note reviewed.  Constitutional:      Appearance: She is well-developed.  HENT:     Head: Normocephalic.     Right Ear: Tympanic membrane normal.     Left Ear: Tympanic membrane normal.     Mouth/Throat:     Mouth: Mucous membranes are moist.  Pulmonary:     Effort: Pulmonary effort is normal.  Abdominal:     General: There is no distension.  Musculoskeletal:        General: Normal range of motion.     Cervical back: Normal range of motion.  Skin:    General: Skin is warm.  Neurological:     Mental Status: She is alert and oriented to person, place, and time.  Psychiatric:        Mood and Affect: Mood normal.      UC Treatments / Results  Labs (all labs ordered are listed, but only abnormal results are displayed) Labs Reviewed  SARS CORONAVIRUS 2 (TAT 6-24 HRS)    EKG   Radiology No results found.  Procedures Procedures (including critical care time)  Medications Ordered in UC Medications - No data to display  Initial Impression / Assessment and Plan / UC Course  I have reviewed the triage vital signs and the nursing notes.  Pertinent labs & imaging results that were  available during my care of the patient were reviewed by me and considered in my medical decision making (see chart for details).  MDM:  covid ordered,  I suspect viral illness Final Clinical Impressions(s) / UC Diagnoses   Final diagnoses:  Viral upper respiratory tract infection     Discharge Instructions     Your covid test is pending.  See your physician for recheck.  Robitussin DM for cough    ED Prescriptions    None     PDMP not reviewed this encounter.   Fransico Meadow, [place] 05/31/20 1549

## 2020-05-31 NOTE — Discharge Instructions (Signed)
Your covid test is pending.  See your physician for recheck.  Robitussin DM for cough

## 2020-05-31 NOTE — ED Triage Notes (Signed)
C/o cough and sore throat x 2 days 

## 2020-06-03 ENCOUNTER — Other Ambulatory Visit: Payer: Self-pay | Admitting: Family Medicine

## 2020-06-03 DIAGNOSIS — Z3009 Encounter for other general counseling and advice on contraception: Secondary | ICD-10-CM

## 2020-06-04 ENCOUNTER — Ambulatory Visit (HOSPITAL_COMMUNITY)
Admission: RE | Admit: 2020-06-04 | Discharge: 2020-06-04 | Disposition: A | Discharge status: Home / Self Care | Payer: BC Managed Care – PPO | Source: Ambulatory Visit | Attending: Family Medicine | Admitting: Family Medicine

## 2020-06-04 ENCOUNTER — Encounter: Payer: Self-pay | Admitting: Family Medicine

## 2020-06-04 ENCOUNTER — Other Ambulatory Visit: Payer: Self-pay

## 2020-06-04 ENCOUNTER — Ambulatory Visit (INDEPENDENT_AMBULATORY_CARE_PROVIDER_SITE_OTHER): Payer: BC Managed Care – PPO | Admitting: Family Medicine

## 2020-06-04 ENCOUNTER — Other Ambulatory Visit (HOSPITAL_COMMUNITY)
Admission: RE | Admit: 2020-06-04 | Discharge: 2020-06-04 | Disposition: A | Discharge status: Home / Self Care | Payer: BC Managed Care – PPO | Source: Ambulatory Visit | Attending: Family Medicine | Admitting: Family Medicine

## 2020-06-04 VITALS — BP 108/60 | HR 72 | Ht 62.0 in | Wt 250.4 lb

## 2020-06-04 DIAGNOSIS — A5901 Trichomonal vulvovaginitis: Secondary | ICD-10-CM | POA: Diagnosis not present

## 2020-06-04 DIAGNOSIS — E038 Other specified hypothyroidism: Secondary | ICD-10-CM

## 2020-06-04 DIAGNOSIS — I498 Other specified cardiac arrhythmias: Secondary | ICD-10-CM | POA: Insufficient documentation

## 2020-06-04 DIAGNOSIS — R002 Palpitations: Secondary | ICD-10-CM | POA: Insufficient documentation

## 2020-06-04 DIAGNOSIS — E039 Hypothyroidism, unspecified: Secondary | ICD-10-CM

## 2020-06-04 DIAGNOSIS — N898 Other specified noninflammatory disorders of vagina: Secondary | ICD-10-CM | POA: Diagnosis not present

## 2020-06-04 DIAGNOSIS — R7303 Prediabetes: Secondary | ICD-10-CM | POA: Diagnosis not present

## 2020-06-04 LAB — POCT GLYCOSYLATED HEMOGLOBIN (HGB A1C): HbA1c, POC (controlled diabetic range): 5.4 % (ref 0.0–7.0)

## 2020-06-04 LAB — POCT WET PREP (WET MOUNT): Clue Cells Wet Prep Whiff POC: NEGATIVE

## 2020-06-04 MED ORDER — METRONIDAZOLE 500 MG PO TABS: 500.0000 mg | ORAL_TABLET | Freq: Two times a day (BID) | ORAL | 0 refills | Status: AC

## 2020-06-04 NOTE — Patient Instructions (Signed)
It is wonderful to see you today as always.  I will let you know the results of your labs when they return back.  Additionally make sure you continue to follow a well-balanced diet and can always have some sweets as a treat.  Continue to stay physically active.  I encourage you to use condoms when sexually active to avoid against any sexually transmitted diseases.

## 2020-06-04 NOTE — Assessment & Plan Note (Signed)
A1c remaining in normal range x2, 5.4 today.  Resolution in the setting of substantial weight loss efforts, encouraged and congratulated her to continue keeping this up.  Will monitor yearly unless concerns.

## 2020-06-04 NOTE — Progress Notes (Signed)
    SUBJECTIVE:   CHIEF COMPLAINT / HPI: STD screening/vaginal itching   Vaginal itching: Reports frequent vaginal irritation/itching for the past few days.  She recently used her boyfriend's body soap in her vaginal region and feels this is irritated the area.  Currently on her menstrual cycle.  Denies any change in vaginal discharge, odor.  Sexually active with one partner at this time, intermittent condom use.  Reports she has been less stringent on her diet recently, craving and eating more sweets sugary foods.  Still walking on a daily basis and staying physically active.  Would like to have her A1c checked again today.  Lastly, reports she felt her heart skip a beat 1-2 times while she was laying down relaxing last week.  Does not notice this with physical activity.  Denies any lightheadedness/dizziness, chest pain, shortness of breath.  Of note, mother had an MI at age 33 and passed away, additionally had diabetes, hypertension, and obesity.  PERTINENT  PMH / PSH: History of mild COVID-19 in 05/2019, BMI 45, subclinical hypothyroidism, GERD, history of prediabetes  OBJECTIVE:   BP 108/60   Pulse 72   Ht 5\' 2"  (1.575 m)   Wt 250 lb 6.4 oz (113.6 kg)   LMP 05/28/2020 (Exact Date)   SpO2 97%   BMI 45.80 kg/m   General: Alert, NAD HEENT: NCAT, MMM Cardiac: RRR no m/g/r Lungs: Clear bilaterally, no increased WOB  Abdomen: soft, non-tender Ext: Warm, dry, 2+ distal pulses Pelvic exam: VULVA: normal appearing vulva with no masses, tenderness or lesions, VAGINA: vaginal discharge - bloody, CERVIX: normal appearing cervix without discharge or lesions, cervical motion tenderness absent, WET MOUNT done - results: trichomonads.  Chaperoned by CMA.  EKG showing sinus rhythm with sinus arrhythmia.  ASSESSMENT/PLAN:   Vaginal trichomoniasis Symptomatic and present on wet prep.  GC/CH pending as well.  Rx'd Flagyl BID x 7 days.  Patient to inform her partner tonight or additionally  provided option of [email or web address] instead, she will contact me if she would like to do EPT for her partner, undecided during visit.  Recommended avoidance of sexual activity during treatment, encouraged condom use to prevent further STDs.  Pre-diabetes A1c remaining in normal range x2, 5.4 today.  Resolution in the setting of substantial weight loss efforts, encouraged and congratulated her to continue keeping this up.  Will monitor yearly unless concerns.  Intermittent palpitations 1-2 episodes recently, at rest only.  Suspect may have been PVCs, likely benign.  EKG sinus with sinus arrhythmia in the office today.  Will continue to monitor and consider cardiology referral in the future if increasing in severity or frequency given family history of early CAD.  Continue to encourage a well-balanced diet and activity as discussed above to mitigate risk.    Follow-up in 6 months or sooner if needed for above.  05/30/2020, DO [facility]

## 2020-06-04 NOTE — Assessment & Plan Note (Signed)
Symptomatic and present on wet prep.  GC/CH pending as well.  Rx'd Flagyl BID x 7 days.  Patient to inform her partner tonight or additionally provided option of [email or web address] instead, she will contact me if she would like to do EPT for her partner, undecided during visit.  Recommended avoidance of sexual activity during treatment, encouraged condom use to prevent further STDs.

## 2020-06-04 NOTE — Assessment & Plan Note (Addendum)
1-2 episodes recently, at rest only.  Suspect may have been PVCs, likely benign.  EKG sinus with sinus arrhythmia in the office today.  Will continue to monitor and consider cardiology referral in the future if increasing in severity or frequency given family history of early CAD.  Continue to encourage a well-balanced diet and activity as discussed above to mitigate risk.

## 2020-06-05 LAB — CERVICOVAGINAL ANCILLARY ONLY
Chlamydia: NEGATIVE
Comment: NEGATIVE
Comment: NORMAL
Neisseria Gonorrhea: NEGATIVE

## 2020-06-27 ENCOUNTER — Encounter: Payer: Self-pay | Admitting: Emergency Medicine

## 2020-07-02 ENCOUNTER — Ambulatory Visit: Payer: BC Managed Care – PPO | Admitting: Emergency Medicine

## 2020-08-06 ENCOUNTER — Other Ambulatory Visit: Payer: Self-pay | Admitting: Family Medicine

## 2020-08-06 DIAGNOSIS — F411 Generalized anxiety disorder: Secondary | ICD-10-CM

## 2020-08-31 ENCOUNTER — Encounter: Payer: Self-pay | Admitting: Adult Health

## 2020-08-31 ENCOUNTER — Other Ambulatory Visit: Payer: Self-pay

## 2020-08-31 ENCOUNTER — Ambulatory Visit (INDEPENDENT_AMBULATORY_CARE_PROVIDER_SITE_OTHER): Payer: BC Managed Care – PPO | Admitting: Adult Health

## 2020-08-31 VITALS — BP 136/76 | HR 81 | Temp 98.3°F | Ht 62.0 in | Wt 266.2 lb

## 2020-08-31 DIAGNOSIS — Z23 Encounter for immunization: Secondary | ICD-10-CM | POA: Diagnosis not present

## 2020-08-31 DIAGNOSIS — J45909 Unspecified asthma, uncomplicated: Secondary | ICD-10-CM | POA: Insufficient documentation

## 2020-08-31 DIAGNOSIS — J302 Other seasonal allergic rhinitis: Secondary | ICD-10-CM | POA: Diagnosis not present

## 2020-08-31 DIAGNOSIS — J4520 Mild intermittent asthma, uncomplicated: Secondary | ICD-10-CM | POA: Diagnosis not present

## 2020-08-31 DIAGNOSIS — K219 Gastro-esophageal reflux disease without esophagitis: Secondary | ICD-10-CM

## 2020-08-31 NOTE — Assessment & Plan Note (Signed)
Continue on GERD diet and Protonix daily

## 2020-08-31 NOTE — Assessment & Plan Note (Signed)
Continue on trigger prevention   Plan  Patient Instructions  We will try starting albuterol, use 2 puffs up to every 4 hours if needed for shortness of breath, chest tightness, wheezing.  You could also try treating about 15 minutes before exercise to see if it makes your exertion easier. Continue your loratadine 10 mg once daily and your fluticasone nasal spray 2 sprays each nostril once daily. Continue Protonix 40 mg daily  GERD diet .  Flu shot today  Follow up with Dr. Delton Coombes in 6 months and As needed

## 2020-08-31 NOTE — Assessment & Plan Note (Signed)
Mild intermittent asthma-improved since last visit.  Patient is advised to use her albuterol as needed.  Advance activity as tolerated. Control for triggers such as postnasal drainage and GERD.  Plan  Patient Instructions  We will try starting albuterol, use 2 puffs up to every 4 hours if needed for shortness of breath, chest tightness, wheezing.  You could also try treating about 15 minutes before exercise to see if it makes your exertion easier. Continue your loratadine 10 mg once daily and your fluticasone nasal spray 2 sprays each nostril once daily. Continue Protonix 40 mg daily  GERD diet .  Flu shot today  Follow up with Dr. Delton Coombes in 6 months and As needed

## 2020-08-31 NOTE — Patient Instructions (Addendum)
We will try starting albuterol, use 2 puffs up to every 4 hours if needed for shortness of breath, chest tightness, wheezing.  You could also try treating about 15 minutes before exercise to see if it makes your exertion easier. Continue your loratadine 10 mg once daily and your fluticasone nasal spray 2 sprays each nostril once daily. Continue Protonix 40 mg daily  GERD diet .  Flu shot today  Follow up with Dr. Delton Coombes in 6 months and As needed

## 2020-08-31 NOTE — Progress Notes (Signed)
@Patient  ID: Veronica [name], Veronica [name]    DOB: October 18, 1991, 29 y.o.   MRN: [id number]  Chief Complaint  Patient presents with  . Follow-up    Dyspnea     Referring provider: [id number], DO  HPI: 29 year old Veronica [name] never smoker seen for pulmonary consult April 2021 for dyspnea. Patient had COVID-27 May 2019 Medical history significant for chronic anxiety, GERD, tension headaches, seasonal allergies  TEST/EVENTS :   chest x-rays from 05/24/2019, 05/27/2019, 08/13/2019, 08/23/2019--all of these were clear without any evidence of interstitial disease, infiltrate, effusion.  Pulmonary function testing 03/2020 normal airflows but with a borderline positive bronchodilator response and a significant improvement in her FEF 25-75%.  Her lung volumes are normal and her diffusion capacity is elevated.  chest x-ray from 03/17/2020  shows some mild central airway thickening, question acute inflammatory change or reactive airways.  No consolidation, effusion, infiltrates.  08/31/2020 Follow up : Dyspnea  Patient returns for a 42-month follow-up.  Patient was seen last visit for follow-up of chronic dyspnea.  She had multiple chest x-rays after having COVID-19 in June 2020.  All x-rays have been clear thus far.  She was set up for pulmonary function testing that showed normal airflow.  There was some reversibility in the mid flows and a positive bronchodilator response. She was suspected to have possibly a component of irritable larynx syndrome.  Versus mild asthma.  She was given albuterol inhaler to use as needed.  She was also placed on a PPI twice daily for possible GERD component. Since last visit patient is feeling Better.  She has noticed some decrease shortness of breath.  She is starting to walk about 45 minutes each day.  She has used her albuterol some but does not feel like she needs it all the time. She denies any significant cough.  Says she has an occasional wheeze. Says that reflux is some  better.  She is back on Protonix daily.  She says she definitely has to avoid red sauces.      Allergies  Allergen Reactions  . Morphine And Related Other (See Comments)    Causes "chest to be heavy"  . Motrin [Ibuprofen]     Was told to not take this because it would flare stomach issues    Immunization History  Administered Date(s) Administered  . Influenza Split 10/20/2011  . Influenza,inj,Quad PF,6+ Mos 10/07/2019, 08/31/2020  . PPD Test 09/07/2018  . Tdap 10/20/2011    Past Medical History:  Diagnosis Date  . Abdominal pain 01/06/2012  . Acute tension headache 07/27/2019  . Anemia   . Ankle pain   . Asthma   . Birth control counseling 08/26/2011  . BMI 40.0-44.9, adult (HCC) 02/27/2011  . GERD (gastroesophageal reflux disease)   . Hiatal hernia   . Obesity (BMI 30-39.9)   . Secondary amenorrhea 03/25/2018    Tobacco History: Social History   Tobacco Use  Smoking Status Never Smoker  Smokeless Tobacco Never Used   Counseling given: Not Answered   Outpatient Medications Prior to Visit  Medication Sig Dispense Refill  . albuterol (VENTOLIN HFA) 108 (90 Base) MCG/ACT inhaler Inhale 2 puffs into the lungs every 6 (six) hours as needed for wheezing or shortness of breath. 18 g 5  . busPIRone (BUSPAR) 10 MG tablet Take 1 tablet (10 mg total) by mouth 2 (two) times daily. 180 tablet 1  . fluticasone (FLONASE) 50 MCG/ACT nasal spray Place 2 sprays into both nostrils daily as needed for  allergies. 16 g 1  . pantoprazole (PROTONIX) 40 MG tablet Take 1 tablet (40 mg total) by mouth daily. 30 tablet 3  . sertraline (ZOLOFT) 100 MG tablet Take 1 tablet by mouth once daily 30 tablet 3  . SPRINTEC 28 0.25-35 MG-MCG tablet Take 1 tablet by mouth once daily 28 tablet 11  . pantoprazole (PROTONIX) 40 MG tablet Take 1 tablet (40 mg total) by mouth 2 (two) times daily. 60 tablet 3  . Pseudoeph-Doxylamine-DM-APAP (NYQUIL PO) Take 30 mLs by mouth at bedtime as needed (sleep).    .  sodium chloride (OCEAN) 0.65 % SOLN nasal spray Place 1 spray into both nostrils as needed for congestion. 15 mL 1   No facility-administered medications prior to visit.     Review of Systems:   Constitutional:   No  weight loss, night sweats,  Fevers, chills,  +fatigue, or  lassitude.  HEENT:   No headaches,  Difficulty swallowing,  Tooth/dental problems, or  Sore throat,                No sneezing, itching, ear ache, nasal congestion, post nasal drip,   CV:  No chest pain,  Orthopnea, PND, swelling in lower extremities, anasarca, dizziness, palpitations, syncope.   GI  No heartburn, indigestion, abdominal pain, nausea, vomiting, diarrhea, change in bowel habits, loss of appetite, bloody stools.   Resp:   No chest wall deformity  Skin: no rash or lesions.  GU: no dysuria, change in color of urine, no urgency or frequency.  No flank pain, no hematuria   MS:  No joint pain or swelling.  No decreased range of motion.  No back pain.    Physical Exam  BP 136/76 (BP Location: Left Arm, Patient Position: Sitting, Cuff Size: Large)   Pulse 81   Temp 98.3 F (36.8 C) (Other (Comment)) Comment (Src): wrist  Ht 5\' 2"  (1.575 m)   Wt 266 lb 3.2 oz (120.7 kg)   SpO2 99%   BMI 48.69 kg/m   GEN: A/Ox3; pleasant , NAD, well nourished    HEENT:  [place]/AT,   NOSE-clear, THROAT-clear, no lesions, no postnasal drip or exudate noted.   NECK:  Supple w/ fair ROM; no JVD; normal carotid impulses w/o bruits; no thyromegaly or nodules palpated; no lymphadenopathy.    RESP  Clear  P & A; w/o, wheezes/ rales/ or rhonchi. no accessory muscle use, no dullness to percussion  CARD:  RRR, no m/r/g, no peripheral edema, pulses intact, no cyanosis or clubbing.  GI:   Soft & nt; nml bowel sounds; no organomegaly or masses detected.   Musco: Warm bil, no deformities or joint swelling noted.   Neuro: alert, no focal deficits noted.    Skin: Warm, no lesions or rashes     BMET  BNP No results  found for: BNP  ProBNP No results found for: PROBNP  Imaging: No results found.    PFT Results Latest Ref Rng & Units 04/06/2020  FVC-Pre L 2.89  FVC-Predicted Pre % 95  FVC-Post L 3.01  FVC-Predicted Post % 99  Pre FEV1/FVC % % 78  Post FEV1/FCV % % 82  FEV1-Pre L 2.24  FEV1-Predicted Pre % 85  FEV1-Post L 2.47  DLCO uncorrected ml/min/mmHg 29.10  DLCO UNC% % 139  DLCO corrected ml/min/mmHg 29.10  DLCO COR %Predicted % 139  DLVA Predicted % 149  TLC L 4.35  TLC % Predicted % 91  RV % Predicted % 102    No  results found for: NITRICOXIDE      Assessment & Plan:   Asthma Mild intermittent asthma-improved since last visit.  Patient is advised to use her albuterol as needed.  Advance activity as tolerated. Control for triggers such as postnasal drainage and GERD.  Plan  Patient Instructions  We will try starting albuterol, use 2 puffs up to every 4 hours if needed for shortness of breath, chest tightness, wheezing.  You could also try treating about 15 minutes before exercise to see if it makes your exertion easier. Continue your loratadine 10 mg once daily and your fluticasone nasal spray 2 sprays each nostril once daily. Continue Protonix 40 mg daily  GERD diet .  Flu shot today  Follow up with Dr. Delton Coombes in 6 months and As needed        Seasonal allergies Continue on trigger prevention   Plan  Patient Instructions  We will try starting albuterol, use 2 puffs up to every 4 hours if needed for shortness of breath, chest tightness, wheezing.  You could also try treating about 15 minutes before exercise to see if it makes your exertion easier. Continue your loratadine 10 mg once daily and your fluticasone nasal spray 2 sprays each nostril once daily. Continue Protonix 40 mg daily  GERD diet .  Flu shot today  Follow up with Dr. Delton Coombes in 6 months and As needed        GERD (gastroesophageal reflux disease) Continue on GERD diet and Protonix  daily     Rubye Oaks, NP 08/31/2020

## 2020-10-13 ENCOUNTER — Other Ambulatory Visit: Payer: Self-pay | Admitting: Internal Medicine

## 2020-10-13 ENCOUNTER — Other Ambulatory Visit: Payer: Self-pay | Admitting: Family Medicine

## 2020-10-13 DIAGNOSIS — J302 Other seasonal allergic rhinitis: Secondary | ICD-10-CM

## 2020-10-28 NOTE — Patient Instructions (Addendum)
It was a pleasure to see you today!  1. Use the spacer with the inhaler and let me know if that works better for you. If still having concerns, return for care for further evaluation.  2. Go to [facility] today for Moderna vaccine.  3. Use flonase one daily every morning for 4-6 weeks for best results to decrease sinus irritation   Be Well,  Dr. Leary Roca

## 2020-10-28 NOTE — Progress Notes (Signed)
    SUBJECTIVE:   CHIEF COMPLAINT / HPI: COVID-19 vaccine, asthma question  Patient would like COVID-19 vaccine, but wants to get Moderna specifically, which we do not have. Helped patient call around to several local pharmacies and identified one today to get Moderna vaccine.   Asthma: Patient has mild asthma, f/u with pulmonology in September, developed after having COVID-19 infection last year. She has had more symptoms recently with the change in weather and wonders if there is a better way for her to use her inhaler as she thinks it is not as effective as it was last month. Reviewed technique, patient does not fully exhale or count to 10, no spacer. Counseled on proper technique, had patient watch video on spacer, pharmacy team discussed it with patient, and she practiced it. She feels that the spacer was much better and she felt the inhaler was "more effective." Recommend trying this method first.   Ears: patient has noticed a fullness or "itching" of ears and would like them examined, she denies any pain, tinnitus, drainage, sore throat, or fever. She has had some congestion for about the last month.   PERTINENT  PMH / PSH: Asthma  OBJECTIVE:  Nursing note and vitals reviewed BP 124/68   Pulse 63   Ht 5\' 2"  (1.575 m)   Wt 267 lb (121.1 kg)   LMP 09/23/2020 (Approximate)   SpO2 97%   BMI 48.83 kg/m   HEENT: PERRL. Sclera without injection or icterus. MMM. External auditory canal examined and WNL. TM normal appearance, no erythema or bulging. Sinus passages b/l are erythematous and edematous, thin, clear secretions present. Neck: Supple.  Cardiac: Regular rate and rhythm. Normal S1/S2. No murmurs, rubs, or gallops appreciated. Lungs: Clear bilaterally to ascultation.   Neuro: AOx3, at baseline Ext: no edema Psych: Pleasant and appropriate   ASSESSMENT/PLAN:   Seasonal allergies Patient with seasonal allergies, congested sinuses with erythema and edema. Has not used flonase yet  this fall. Counseled on use and best results with continuous usage ~4-6 weeks. - flonase 1 spray each nare daily  Healthcare maintenance Patient to go to [facility] to get Moderna vaccine today, counseled on COVID-19 vaccine  Asthma Patient counseled on appropriate technique and given spacer with demonstration. She reports improvement in symptoms with use so far. Recommend RTC if continuing to have symptoms every day even with these changes.     09/25/2020, MD [facility]

## 2020-10-29 ENCOUNTER — Other Ambulatory Visit: Payer: Self-pay

## 2020-10-29 ENCOUNTER — Ambulatory Visit (INDEPENDENT_AMBULATORY_CARE_PROVIDER_SITE_OTHER): Payer: BC Managed Care – PPO | Admitting: Family Medicine

## 2020-10-29 ENCOUNTER — Encounter: Payer: Self-pay | Admitting: Family Medicine

## 2020-10-29 VITALS — BP 124/68 | HR 63 | Ht 62.0 in | Wt 267.0 lb

## 2020-10-29 DIAGNOSIS — Z0000 Encounter for general adult medical examination without abnormal findings: Secondary | ICD-10-CM | POA: Diagnosis not present

## 2020-10-29 DIAGNOSIS — J4520 Mild intermittent asthma, uncomplicated: Secondary | ICD-10-CM

## 2020-10-29 DIAGNOSIS — J302 Other seasonal allergic rhinitis: Secondary | ICD-10-CM

## 2020-10-29 MED ORDER — FLUTICASONE PROPIONATE 50 MCG/ACT NA SUSP: 1.0000 | Freq: Every day | NASAL | 1 refills | Status: DC

## 2020-10-30 MED ORDER — AEROCHAMBER PLUS FLO-VU LARGE MISC: 1.0000 | Freq: Once | Status: AC

## 2020-10-30 NOTE — Assessment & Plan Note (Signed)
Patient counseled on appropriate technique and given spacer with demonstration. She reports improvement in symptoms with use so far. Recommend RTC if continuing to have symptoms every day even with these changes.

## 2020-10-30 NOTE — Assessment & Plan Note (Signed)
Patient to go to [facility] to get Moderna vaccine today, counseled on COVID-19 vaccine

## 2020-10-30 NOTE — Assessment & Plan Note (Signed)
Patient with seasonal allergies, congested sinuses with erythema and edema. Has not used flonase yet this fall. Counseled on use and best results with continuous usage ~4-6 weeks. - flonase 1 spray each nare daily

## 2020-11-21 ENCOUNTER — Telehealth: Payer: Self-pay | Admitting: Emergency Medicine

## 2020-11-21 NOTE — Telephone Encounter (Signed)
Disregard sorry

## 2020-11-26 ENCOUNTER — Ambulatory Visit (INDEPENDENT_AMBULATORY_CARE_PROVIDER_SITE_OTHER): Payer: BC Managed Care – PPO | Admitting: Student in an Organized Health Care Education/Training Program

## 2020-11-26 ENCOUNTER — Ambulatory Visit (HOSPITAL_COMMUNITY)
Admission: RE | Admit: 2020-11-26 | Discharge: 2020-11-26 | Disposition: A | Discharge status: Home / Self Care | Payer: BC Managed Care – PPO | Source: Ambulatory Visit | Attending: Family Medicine | Admitting: Family Medicine

## 2020-11-26 ENCOUNTER — Other Ambulatory Visit: Payer: Self-pay

## 2020-11-26 ENCOUNTER — Ambulatory Visit (HOSPITAL_COMMUNITY): Payer: BC Managed Care – PPO

## 2020-11-26 ENCOUNTER — Encounter: Payer: Self-pay | Admitting: Student in an Organized Health Care Education/Training Program

## 2020-11-26 VITALS — BP 122/72 | HR 73 | Wt 270.2 lb

## 2020-11-26 DIAGNOSIS — R002 Palpitations: Secondary | ICD-10-CM | POA: Insufficient documentation

## 2020-11-26 DIAGNOSIS — F419 Anxiety disorder, unspecified: Secondary | ICD-10-CM | POA: Insufficient documentation

## 2020-11-26 DIAGNOSIS — Z3009 Encounter for other general counseling and advice on contraception: Secondary | ICD-10-CM | POA: Diagnosis not present

## 2020-11-26 DIAGNOSIS — K219 Gastro-esophageal reflux disease without esophagitis: Secondary | ICD-10-CM

## 2020-11-26 DIAGNOSIS — Z7251 High risk heterosexual behavior: Secondary | ICD-10-CM

## 2020-11-26 LAB — POCT URINE PREGNANCY: Preg Test, Ur: NEGATIVE

## 2020-11-26 NOTE — Assessment & Plan Note (Signed)
Frequency has increased to multiple times per day and likely associated with increased stress/anciety in the past 3 weeks. She continues to deny symptoms other than flutter sensation.  ECG NSR today. Refer to cardiology for holter monitoring. Recommended hydration, no caffeine, address her anxiety

## 2020-11-26 NOTE — Patient Instructions (Addendum)
It was a pleasure to see you today!  To summarize our discussion for this visit:  I am referring you to cardiology to monitor your heart rate for a longer time since it looked normal on the ECG today. In the meantime, I would recommend stay well-hydrated, avoid caffeine, try to limit stress and anxiety provoking situations in her life if possible.  I would recommend starting counseling to help manage these.  Go to [email or web address] to search for counselors that accept your insurance and are located near you.  We are checking a urine pregnancy test today and planning on starting a patch for birth control.  Please continue to use a condom for infection prevention and to prevent pregnancy.  For acid reflux, you can take Protonix twice a day to see if that improves her symptoms.  If not, please follow-up with your PCP for further options.  Some additional health maintenance measures we should update are: Health Maintenance Due  Topic Date Due  . COVID-19 Vaccine (1) Never done  .    Call the clinic at [phone] if your symptoms worsen or you have any concerns.   Thank you for allowing me to take part in your care,  Dr. Jamelle Rushing   Food Choices for Gastroesophageal Reflux Disease, Adult When you have gastroesophageal reflux disease (GERD), the foods you eat and your eating habits are very important. Choosing the right foods can help ease your discomfort. Think about working with a nutrition specialist (dietitian) to help you make good choices. What are tips for following this plan?  Meals  Choose healthy foods that are low in fat, such as fruits, vegetables, whole grains, low-fat dairy products, and lean meat, fish, and poultry.  Eat small meals often instead of 3 large meals a day. Eat your meals slowly, and in a place where you are relaxed. Avoid bending over or lying down until 2-3 hours after eating.  Avoid eating meals 2-3 hours before bed.  Avoid drinking a lot of  liquid with meals.  Cook foods using methods other than frying. Bake, grill, or broil food instead.  Avoid or limit: ? Chocolate. ? Peppermint or spearmint. ? Alcohol. ? Pepper. ? Black and decaffeinated coffee. ? Black and decaffeinated tea. ? Bubbly (carbonated) soft drinks. ? Caffeinated energy drinks and soft drinks.  Limit high-fat foods such as: ? Fatty meat or fried foods. ? Whole milk, cream, butter, or ice cream. ? Nuts and nut butters. ? Pastries, donuts, and sweets made with butter or shortening.  Avoid foods that cause symptoms. These foods may be different for everyone. Common foods that cause symptoms include: ? Tomatoes. ? Oranges, lemons, and limes. ? Peppers. ? Spicy food. ? Onions and garlic. ? Vinegar. Lifestyle  Maintain a healthy weight. Ask your doctor what weight is healthy for you. If you need to lose weight, work with your doctor to do so safely.  Exercise for at least 30 minutes for 5 or more days each week, or as told by your doctor.  Wear loose-fitting clothes.  Do not smoke. If you need help quitting, ask your doctor.  Sleep with the head of your bed higher than your feet. Use a wedge under the mattress or blocks under the bed frame to raise the head of the bed. Summary  When you have gastroesophageal reflux disease (GERD), food and lifestyle choices are very important in easing your symptoms.  Eat small meals often instead of 3 large meals a day. Eat your  meals slowly, and in a place where you are relaxed.  Limit high-fat foods such as fatty meat or fried foods.  Avoid bending over or lying down until 2-3 hours after eating.  Avoid peppermint and spearmint, caffeine, alcohol, and chocolate. This information is not intended to replace advice given to you by your health care provider. Make sure you discuss any questions you have with your health care provider. Document Revised: 03/17/2019 Document Reviewed: 12/30/2016 Elsevier Patient  Education  2020 [employer].    Contraception Choices Contraception, also called birth control, refers to methods or devices that prevent pregnancy. Hormonal methods Contraceptive implant  A contraceptive implant is a thin, plastic tube that contains a hormone. It is inserted into the upper part of the arm. It can remain in place for up to 3 years. Progestin-only injections Progestin-only injections are injections of progestin, a synthetic form of the hormone progesterone. They are given every 3 months by a health care provider. Birth control pills  Birth control pills are pills that contain hormones that prevent pregnancy. They must be taken once a day, preferably at the same time each day. Birth control patch  The birth control patch contains hormones that prevent pregnancy. It is placed on the skin and must be changed once a week for three weeks and removed on the fourth week. A prescription is needed to use this method of contraception. Vaginal ring  A vaginal ring contains hormones that prevent pregnancy. It is placed in the vagina for three weeks and removed on the fourth week. After that, the process is repeated with a new ring. A prescription is needed to use this method of contraception. Emergency contraceptive Emergency contraceptives prevent pregnancy after unprotected sex. They come in pill form and can be taken up to 5 days after sex. They work best the sooner they are taken after having sex. Most emergency contraceptives are available without a prescription. This method should not be used as your only form of birth control. Barrier methods Female condom  A female condom is a thin sheath that is worn over the penis during sex. Condoms keep sperm from going inside a woman's body. They can be used with a spermicide to increase their effectiveness. They should be disposed after a single use. Female condom  A female condom is a soft, loose-fitting sheath that is put into the  vagina before sex. The condom keeps sperm from going inside a woman's body. They should be disposed after a single use. Diaphragm  A diaphragm is a soft, dome-shaped barrier. It is inserted into the vagina before sex, along with a spermicide. The diaphragm blocks sperm from entering the uterus, and the spermicide kills sperm. A diaphragm should be left in the vagina for 6-8 hours after sex and removed within 24 hours. A diaphragm is prescribed and fitted by a health care provider. A diaphragm should be replaced every 1-2 years, after giving birth, after gaining more than 15 lb (6.8 kg), and after pelvic surgery. Cervical cap  A cervical cap is a round, soft latex or plastic cup that fits over the cervix. It is inserted into the vagina before sex, along with spermicide. It blocks sperm from entering the uterus. The cap should be left in place for 6-8 hours after sex and removed within 48 hours. A cervical cap must be prescribed and fitted by a health care provider. It should be replaced every 2 years. Sponge  A sponge is a soft, circular piece of polyurethane foam  with spermicide on it. The sponge helps block sperm from entering the uterus, and the spermicide kills sperm. To use it, you make it wet and then insert it into the vagina. It should be inserted before sex, left in for at least 6 hours after sex, and removed and thrown away within 30 hours. Spermicides Spermicides are chemicals that kill or block sperm from entering the cervix and uterus. They can come as a cream, jelly, suppository, foam, or tablet. A spermicide should be inserted into the vagina with an applicator at least 10-15 minutes before sex to allow time for it to work. The process must be repeated every time you have sex. Spermicides do not require a prescription. Intrauterine contraception Intrauterine device (IUD) An IUD is a T-shaped device that is put in a woman's uterus. There are two types:  Hormone IUD.This type contains  progestin, a synthetic form of the hormone progesterone. This type can stay in place for 3-5 years.  Copper IUD.This type is wrapped in copper wire. It can stay in place for 10 years.  Permanent methods of contraception Female tubal ligation In this method, a woman's fallopian tubes are sealed, tied, or blocked during surgery to prevent eggs from traveling to the uterus. Hysteroscopic sterilization In this method, a small, flexible insert is placed into each fallopian tube. The inserts cause scar tissue to form in the fallopian tubes and block them, so sperm cannot reach an egg. The procedure takes about 3 months to be effective. Another form of birth control must be used during those 3 months. Female sterilization This is a procedure to tie off the tubes that carry sperm (vasectomy). After the procedure, the man can still ejaculate fluid (semen). Natural planning methods Natural family planning In this method, a couple does not have sex on days when the woman could become pregnant. Calendar method This means keeping track of the length of each menstrual cycle, identifying the days when pregnancy can happen, and not having sex on those days. Ovulation method In this method, a couple avoids sex during ovulation. Symptothermal method This method involves not having sex during ovulation. The woman typically checks for ovulation by watching changes in her temperature and in the consistency of cervical mucus. Post-ovulation method In this method, a couple waits to have sex until after ovulation. Summary  Contraception, also called birth control, means methods or devices that prevent pregnancy.  Hormonal methods of contraception include implants, injections, pills, patches, vaginal rings, and emergency contraceptives.  Barrier methods of contraception can include female condoms, female condoms, diaphragms, cervical caps, sponges, and spermicides.  There are two types of IUDs (intrauterine  devices). An IUD can be put in a woman's uterus to prevent pregnancy for 3-5 years.  Permanent sterilization can be done through a procedure for males, females, or both.  Natural family planning methods involve not having sex on days when the woman could become pregnant. This information is not intended to replace advice given to you by your health care provider. Make sure you discuss any questions you have with your health care provider. Document Revised: 11/26/2017 Document Reviewed: 12/27/2016 Elsevier Patient Education  2020 [employer].

## 2020-11-26 NOTE — Assessment & Plan Note (Signed)
Can trial BID protonix. Counseled patient on diet and lifestyle changes to help prevent including avoiding greasy/spicy foods, weight loss, and elevation of head after meals/bedtime.  Return to see PCP if not improved with these measures.

## 2020-11-26 NOTE — Progress Notes (Signed)
   SUBJECTIVE:   CHIEF COMPLAINT / HPI: palpitations, back pain  Flutter- feels like heart skipping a beat. Multiple times per day, mostly at night. No lightheadedness. Never happened in the past this frequently. 2-3 weeks is constant. A lot of increased anxiety during this period. buspar and zoloft. Not in counseling but interested in starting. Not having a good diet. Drinks tea and coffee occasionally. No sodas or energy drinks. Does not smoke.  Sleep has been good but feeling more sleepy. Takes albuterol every morning but does not seem to bring on this sensation  LMP- Nov 22nd and regular. Has been having unprotected sex since then. No vaginal symptoms including increased discharge. Concern that she may be pregnant since she has not been reliably taking her oral birth control pills. Wants to do the patch.  Acid reflux- takes protonix am and pepcid pm. Continues to have acid reflux symptoms. Does not make diet or lifestyle modifications.  OBJECTIVE:   BP 122/72   Pulse 73   Wt 270 lb 3.2 oz (122.6 kg)   SpO2 97%   BMI 49.42 kg/m   General: NAD, pleasant, able to participate in exam Cardiac: RRR, normal heart sounds, no murmurs. 2+ radial and PT pulses bilaterally Respiratory: CTAB, normal effort, No wheezes, rales or rhonchi Extremities: no edema. WWP. Skin: warm and dry, no rashes noted Neuro: alert and oriented, no focal deficits Psych: Normal affect and mood  ASSESSMENT/PLAN:   Palpitations Frequency has increased to multiple times per day and likely associated with increased stress/anciety in the past 3 weeks. She continues to deny symptoms other than flutter sensation.  ECG NSR today. Refer to cardiology for holter monitoring. Recommended hydration, no caffeine, address her anxiety  Anxiety Continue buspar and zoloft Patient interested in counseling. Recommended [email or web address]  Birth control counseling Does not desire pregnancy but is not consistently taking  OCPs and is sexually active without barrier protection.  She would like to use a patch but has BMI 49 so was informed of possible decreased efficacy of this option. She would like to take some time to think about the different options so gave her a handout with descriptions of different options.  Recommended barrier protection in the meantime Urine pregnancy test negative today.   GERD (gastroesophageal reflux disease) Can trial BID protonix. Counseled patient on diet and lifestyle changes to help prevent including avoiding greasy/spicy foods, weight loss, and elevation of head after meals/bedtime.  Return to see PCP if not improved with these measures.     Leeroy Bock, DO [facility]

## 2020-11-26 NOTE — Assessment & Plan Note (Signed)
Continue buspar and zoloft Patient interested in counseling. Recommended [email or web address]

## 2020-11-26 NOTE — Assessment & Plan Note (Signed)
Does not desire pregnancy but is not consistently taking OCPs and is sexually active without barrier protection.  She would like to use a patch but has BMI 49 so was informed of possible decreased efficacy of this option. She would like to take some time to think about the different options so gave her a handout with descriptions of different options.  Recommended barrier protection in the meantime Urine pregnancy test negative today.

## 2020-11-26 NOTE — Assessment & Plan Note (Signed)
>>  ASSESSMENT AND PLAN FOR ANXIETY WRITTEN ON 11/26/2020 12:06 PM BY ANDERSON, CHELSEY L, DO  Continue buspar and zoloft Patient interested in counseling. Recommended [email or web address]

## 2020-12-26 ENCOUNTER — Encounter: Payer: Self-pay | Admitting: Cardiology

## 2020-12-26 ENCOUNTER — Encounter: Payer: Self-pay | Admitting: *Deleted

## 2020-12-26 ENCOUNTER — Ambulatory Visit (INDEPENDENT_AMBULATORY_CARE_PROVIDER_SITE_OTHER): Payer: BC Managed Care – PPO | Admitting: Cardiology

## 2020-12-26 ENCOUNTER — Other Ambulatory Visit: Payer: Self-pay

## 2020-12-26 ENCOUNTER — Ambulatory Visit (INDEPENDENT_AMBULATORY_CARE_PROVIDER_SITE_OTHER): Payer: BC Managed Care – PPO

## 2020-12-26 VITALS — BP 114/70 | HR 72 | Ht 62.0 in | Wt 273.0 lb

## 2020-12-26 DIAGNOSIS — F419 Anxiety disorder, unspecified: Secondary | ICD-10-CM

## 2020-12-26 DIAGNOSIS — R002 Palpitations: Secondary | ICD-10-CM

## 2020-12-26 DIAGNOSIS — Z8249 Family history of ischemic heart disease and other diseases of the circulatory system: Secondary | ICD-10-CM | POA: Diagnosis not present

## 2020-12-26 NOTE — Progress Notes (Signed)
Patient ID: Veronica [name], female   DOB: 1991-03-04, 30 y.o.   MRN: [id number] Patient enrolled for [employer] to ship a 14 day ZIO XT long term holter monitor to her home.

## 2020-12-26 NOTE — Patient Instructions (Signed)
Medication Instructions:  Your physician recommends that you continue on your current medications as directed. Please refer to the Current Medication list given to you today.  *If you need a refill on your cardiac medications before your next appointment, please call your pharmacy*   Lab Work: none If you have labs (blood work) drawn today and your tests are completely normal, you will receive your results only by: Marland Kitchen MyChart Message (if you have MyChart) OR . A paper copy in the mail If you have any lab test that is abnormal or we need to change your treatment, we will call you to review the results.   Testing/Procedures: ZIO Monitor  Your physician has requested you wear a ZIO patch monitor for 14 days.  This is a single patch monitor. [employer] supplies one patch monitor per enrollment. Additional stickers are not available.  Please do not apply patch if you will be having a Nuclear Stress Test, Echocardiogram, Cardiac CT, MRI, or Chest Xray during the time frame you would be wearing the monitor. The patch cannot be worn during these tests. You cannot remove and re-apply the ZIO AT patch monitor.   Your ZIO patch monitor will be sent Fed Ex from [employer] directly to your home address. The monitor may also be mailed to a PO BOX if home delivery is not available. It may take 3-5 days to receive your monitor after you have been enrolled.  Once you have received you monitor, please review enclosed instructions. Your monitor has already been registered assigning a specific monitor serial # to you.   Applying the monitor  Shave hair from upper left chest.  Hold abrader disc by orange tab. Rub abrader in 40 strokes over left upper chest as indicated in your monitor instructions.  Clean area with 4 enclosed alcohol pads. Use all pads to ensure the area is cleaned thoroughly. Let dry.  Apply patch as indicated in monitor instructions. Patch will be placed under collarbone on left side  of chest with arrow pointing upward.  Rub patch adhesive wings for 2 minutes. Remove the white label marked "1". Remove the white label marked "2". Rub patch adhesive wings for 2 additional minutes.  While looking in a mirror, press and release button in center of patch. A small green light will flash 3-4 times. This will be your only indicator the monitor has been turned on.  Do not shower for the first 24 hours. You may shower after the first 24 hours.  Press the button if you feel a symptom. You will hear a small click. Record Date, Time and Symptom in the Patient Log.   Returning your monitor  Remove your patch and place it inside the Gateway. In the lower half of the Gateway there is a white bag with prepaid postage on it. Place Gateway in bag and seal. Mail package back to [place] as soon as possible. Your physician should have your final report approximately 7 days after you have mailed back your monitor.   Call [facility] Customer Care at [phone] if you have questions regarding your ZIO AT patch monitor. Call them immediately if you see an orange light blinking on your monitor.  If your monitor falls off in less than 4 days contact our Monitor department at [phone]. If your monitor becomes loose or falls off after 4 days call [employer] at [phone] for suggestions on securing your monitor.       Follow-Up: At [facility], you and your health  needs are our priority.  As part of our continuing mission to provide you with exceptional heart care, we have created designated Provider Care Teams.  These Care Teams include your primary Cardiologist (physician) and Advanced Practice Providers (APPs -  Physician Assistants and Nurse Practitioners) who all work together to provide you with the care you need, when you need it.  We recommend signing up for the patient portal called "MyChart".  Sign up information is provided on this After Visit Summary.  MyChart is used to  connect with patients for Virtual Visits (Telemedicine).  Patients are able to view lab/test results, encounter notes, upcoming appointments, etc.  Non-urgent messages can be sent to your provider as well.   To learn more about what you can do with MyChart, go to [email or web address].    Your next appointment:   As Needed

## 2020-12-26 NOTE — Progress Notes (Signed)
Cardiology Office Note:    Date:  12/26/2020   ID:  Veronica LaughterKimberly R Hensarling, DOB 1991-04-19, MRN [id number]  PCP:  Allayne StackBeard, Samantha N, DO  [facility] Cardiologist:  Donato SchultzMark Arvine Clayburn, MD  [facility] Electrophysiologist:  None   Referring MD: Carney Livinghambliss, Marshall L, *     History of Present Illness:    Veronica [name] is a 30 y.o. female here for the evaluation of palpitations, flutter at the request of Dr. Annia FriendlyBeard.  Been feeling skipping, multiple times a day.  Can feel at night. May feel it the whole day than skip a day then come. Rare caffine.  No associated dizziness or syncope.  Has been under more anxiety recently.  Mother had heart disease and MI 7444. Maybe lightheaded with skips.   TSH in 2020 normal. COVID - asthma.   Past Medical History:  Diagnosis Date  . Abdominal pain 01/06/2012  . Acute tension headache 07/27/2019  . Anemia   . Ankle pain   . Asthma   . Birth control counseling 08/26/2011  . BMI 40.0-44.9, adult (HCC) 02/27/2011  . GERD (gastroesophageal reflux disease)   . Hiatal hernia   . Obesity (BMI 30-39.9)   . Secondary amenorrhea 03/25/2018    Past Surgical History:  Procedure Laterality Date  . CESAREAN SECTION  09/02/2010   For macrosomia, but son was 7 lb 12 oz  . TONSILLECTOMY AND ADENOIDECTOMY      Current Medications: Current Meds  Medication Sig  . albuterol (VENTOLIN HFA) 108 (90 Base) MCG/ACT inhaler INHALE 2 PUFFS BY MOUTH EVERY 4 HOURS AS NEEDED FOR WHEEZING OR SHORTNESS OF BREATH (COUGH).  . busPIRone (BUSPAR) 10 MG tablet Take 1 tablet by mouth twice daily  . fluticasone (FLONASE) 50 MCG/ACT nasal spray Place 1 spray into both nostrils daily.  . pantoprazole (PROTONIX) 40 MG tablet Take 1 tablet (40 mg total) by mouth daily.  . sertraline (ZOLOFT) 100 MG tablet Take 1 tablet by mouth once daily  . SPRINTEC 28 0.25-35 MG-MCG tablet Take 1 tablet by mouth once daily     Allergies:   Morphine and related and Motrin [ibuprofen]   Social  History   Socioeconomic History  . Marital status: Single    Spouse name: Not on file  . Number of children: Not on file  . Years of education: Not on file  . Highest education level: Not on file  Occupational History  . Occupation: Designer, fashion/clothingroses    Employer: UNEMPLOYED    Comment: sales  Tobacco Use  . Smoking status: Never Smoker  . Smokeless tobacco: Never Used  Vaping Use  . Vaping Use: Never used  Substance and Sexual Activity  . Alcohol use: No  . Drug use: No  . Sexual activity: Yes    Birth control/protection: Injection  Other Topics Concern  . Not on file  Social History Narrative   Lives son ([name] 09/02/2010 ), sister Martie Lee(Sabrina Ambler 10/28/94), and mom (Mary Grau-Gillis) and mom's fiance.    Attending [employer] to obtain GED.   Mom's fiance smokes outside house.    Pet: dog               Social Determinants of Corporate investment bankerHealth   Financial Resource Strain: Not on file  Food Insecurity: Not on file  Transportation Needs: Not on file  Physical Activity: Not on file  Stress: Not on file  Social Connections: Not on file     Family History: The patient's family history includes Diabetes in  her mother and sister; Heart attack (age of onset: 80) in her mother; Hypertension in her mother and sister; Obesity in her mother and sister; Osteochondroma in her sister.  ROS:   Please see the history of present illness.    No fevers chills nausea vomiting syncope bleeding all other systems reviewed and are negative.  EKGs/Labs/Other Studies Reviewed:    The following studies were reviewed today: Prior office note reviewed  EKG: EKG from December 2021 was benign, normal sinus rhythm  Recent Labs: No results found for requested labs within last 8760 hours.  Recent Lipid Panel    Component Value Date/Time   CHOL 131 08/19/2019 1452   TRIG 69 08/19/2019 1452   HDL 37 (L) 08/19/2019 1452   CHOLHDL 3.5 08/19/2019 1452   LDLCALC 80 08/19/2019 1452     Risk  Assessment/Calculations:       Physical Exam:    VS:  BP 114/70 (BP Location: Left Arm, Patient Position: Sitting, Cuff Size: Normal)   Pulse 72   Ht 5\' 2"  (1.575 m)   Wt 273 lb (123.8 kg)   SpO2 97%   BMI 49.93 kg/m     Wt Readings from Last 3 Encounters:  12/26/20 273 lb (123.8 kg)  11/26/20 270 lb 3.2 oz (122.6 kg)  10/29/20 267 lb (121.1 kg)     GEN:  Well nourished, well developed in no acute distress HEENT: Normal NECK: No JVD; No carotid bruits LYMPHATICS: No lymphadenopathy CARDIAC: RRR, no murmurs, rubs, gallops RESPIRATORY:  Clear to auscultation without rales, wheezing or rhonchi  ABDOMEN: Soft, non-tender, non-distended MUSCULOSKELETAL:  No edema; No deformity  SKIN: Warm and dry NEUROLOGIC:  Alert and oriented x 3 PSYCHIATRIC:  Normal affect   ASSESSMENT:    1. Palpitations   2. Anxiety   3. Family history of early CAD    PLAN:    In order of problems listed above:  Palpitations - We will check a Zio patch monitor. -Differential includes PVCs or PACs.  Sometimes she will feel them after taking albuterol inhaler for her asthma.  Explained the physiology behind this.  She also may feel them with anxiety, stress hormones.  TSH back in 2020 was normal. -If these are PVCs, we can continue with conservative management.  Rarely do we need to utilize beta-blockers or calcium channel blockers.  We will let her know the results of testing.  Family history of early CAD - Mother died at age 37 massive heart attack.  Her current LDL is 80.  Excellent.  Diet, exercise.          Medication Adjustments/Labs and Tests Ordered: Current medicines are reviewed at length with the patient today.  Concerns regarding medicines are outlined above.  Orders Placed This Encounter  Procedures  . LONG TERM MONITOR (3-14 DAYS)   No orders of the defined types were placed in this encounter.   Patient Instructions  Medication Instructions:  Your physician recommends  that you continue on your current medications as directed. Please refer to the Current Medication list given to you today.  *If you need a refill on your cardiac medications before your next appointment, please call your pharmacy*   Lab Work: none If you have labs (blood work) drawn today and your tests are completely normal, you will receive your results only by: 59 MyChart Message (if you have MyChart) OR . A paper copy in the mail If you have any lab test that is abnormal or we need to  change your treatment, we will call you to review the results.   Testing/Procedures: ZIO Monitor  Your physician has requested you wear a ZIO patch monitor for 14 days.  This is a single patch monitor. [employer] supplies one patch monitor per enrollment. Additional stickers are not available.  Please do not apply patch if you will be having a Nuclear Stress Test, Echocardiogram, Cardiac CT, MRI, or Chest Xray during the time frame you would be wearing the monitor. The patch cannot be worn during these tests. You cannot remove and re-apply the ZIO AT patch monitor.   Your ZIO patch monitor will be sent Fed Ex from [employer] directly to your home address. The monitor may also be mailed to a PO BOX if home delivery is not available. It may take 3-5 days to receive your monitor after you have been enrolled.  Once you have received you monitor, please review enclosed instructions. Your monitor has already been registered assigning a specific monitor serial # to you.   Applying the monitor  Shave hair from upper left chest.  Hold abrader disc by orange tab. Rub abrader in 40 strokes over left upper chest as indicated in your monitor instructions.  Clean area with 4 enclosed alcohol pads. Use all pads to ensure the area is cleaned thoroughly. Let dry.  Apply patch as indicated in monitor instructions. Patch will be placed under collarbone on left side of chest with arrow pointing upward.  Rub patch  adhesive wings for 2 minutes. Remove the white label marked "1". Remove the white label marked "2". Rub patch adhesive wings for 2 additional minutes.  While looking in a mirror, press and release button in center of patch. A small green light will flash 3-4 times. This will be your only indicator the monitor has been turned on.  Do not shower for the first 24 hours. You may shower after the first 24 hours.  Press the button if you feel a symptom. You will hear a small click. Record Date, Time and Symptom in the Patient Log.   Returning your monitor  Remove your patch and place it inside the Gateway. In the lower half of the Gateway there is a white bag with prepaid postage on it. Place Gateway in bag and seal. Mail package back to [place] as soon as possible. Your physician should have your final report approximately 7 days after you have mailed back your monitor.   Call [facility] Customer Care at [phone] if you have questions regarding your ZIO AT patch monitor. Call them immediately if you see an orange light blinking on your monitor.  If your monitor falls off in less than 4 days contact our Monitor department at [phone]. If your monitor becomes loose or falls off after 4 days call [employer] at [phone] for suggestions on securing your monitor.       Follow-Up: At [facility], you and your health needs are our priority.  As part of our continuing mission to provide you with exceptional heart care, we have created designated Provider Care Teams.  These Care Teams include your primary Cardiologist (physician) and Advanced Practice Providers (APPs -  Physician Assistants and Nurse Practitioners) who all work together to provide you with the care you need, when you need it.  We recommend signing up for the patient portal called "MyChart".  Sign up information is provided on this After Visit Summary.  MyChart is used to connect with patients for Virtual Visits  (Telemedicine).  Patients are able to view lab/test results, encounter notes, upcoming appointments, etc.  Non-urgent messages can be sent to your provider as well.   To learn more about what you can do with MyChart, go to [email or web address].    Your next appointment:   As Needed     Signed, Donato Schultz, MD  12/26/2020 3:06 PM    [facility]

## 2021-01-25 ENCOUNTER — Other Ambulatory Visit: Payer: Self-pay | Admitting: Family Medicine

## 2021-02-02 ENCOUNTER — Other Ambulatory Visit: Payer: Self-pay

## 2021-02-02 ENCOUNTER — Inpatient Hospital Stay (HOSPITAL_COMMUNITY)
Admission: AD | Admit: 2021-02-02 | Discharge: 2021-02-02 | Disposition: A | Discharge status: Home / Self Care | Payer: BC Managed Care – PPO | Attending: Obstetrics and Gynecology | Admitting: Obstetrics and Gynecology

## 2021-02-02 DIAGNOSIS — N912 Amenorrhea, unspecified: Secondary | ICD-10-CM

## 2021-02-02 NOTE — MAU Provider Note (Signed)
Event Date/Time   First Provider Initiated Contact with Patient 02/02/21 2258      S Ms. Veronica [name] is a 30 y.o. No obstetric history on file. patient who presents to MAU today with complaint of pregnancy symptoms and desire to confirm pregnancy. She reports some nausea, lightheadedness, dizziness, fatigue, and bloating.  She reports her LMP was Jan 22nd.  She did not take a pregnancy test at home.   O BP 104/60 (BP Location: Right Arm)   Pulse 99   Temp 98.5 F (36.9 C) (Oral)   Resp 18   Ht 5\' 2"  (1.575 m)   Wt 128.3 kg   SpO2 99%   BMI 51.74 kg/m  Physical Exam Vitals reviewed.  Constitutional:      Appearance: Normal appearance. She is obese.  HENT:     Mouth/Throat:     Mouth: Mucous membranes are moist.  Eyes:     Conjunctiva/sclera: Conjunctivae normal.  Cardiovascular:     Rate and Rhythm: Normal rate.  Pulmonary:     Effort: Pulmonary effort is normal. No respiratory distress.  Musculoskeletal:        General: Normal range of motion.     Cervical back: Normal range of motion.  Neurological:     Mental Status: She is alert and oriented to person, place, and time.  Psychiatric:        Mood and Affect: Mood normal.        Behavior: Behavior normal.        Thought Content: Thought content normal.     A Medical screening exam complete Amenorrhea  P -Informed that confirmation of pregnancy would not be performed tonight. -Informed that she can go to any [facility] office and have a pregnancy test performed on Monday. -Further informed that she can also go to dollar store and get pregnancy test as these are accurate tests.  -Patient questions that if she is pregnant what would date of conception be based upon.  Informed that it is based on LMP. -Patient without further questions or concerns. -Discharged to home in stable condition.  Wednesday, CNM 02/02/2021 10:58 PM

## 2021-02-02 NOTE — MAU Note (Signed)
Patient reports nausea, light-headedness and dizziness. Her period is 3 days late. Has not taken a home pregnancy test. Her stomach is aching but is unsure if it is related to her hiatal hernia. Denies vaginal bleeding.   LMP: 12/29/2020  Pain score: 5/10

## 2021-02-02 NOTE — Discharge Instructions (Signed)
Primary Amenorrhea Primary amenorrhea is when a female has not started having periods by the time she is 30 years old. What are the causes? This condition may be caused by:  An abnormal chromosome that causes the ovaries not to work properly. This is a common cause.  Polycystic ovary syndrome.  Being born without a vagina, uterus, or ovaries.  Cystic fibrosis.  Pituitary gland tumor in the brain.  Long-term illnesses.  Cushing disease.  A thyroid disease, such as hypothyroidism or hyperthyroidism.  A part of the brain called the hypothalamus not working normally.  Premature ovarian failure. Other causes of this condition include:  Malnutrition.  Extreme obesity.  Low blood sugar (hypoglycemia).  Drastic weight loss.  Overexercising that leads to a loss of body fat. What are the signs or symptoms? The main symptom of this condition is a female not having had a period by 29 years of age. Other symptoms may include:  Discharge from the breasts.  Hot flashes.  Adult acne.  Facial or chest hair.  Headaches.  Impaired vision.  Recent excessive stress.  Changes in weight, diet, or exercise patterns. How is this diagnosed? This condition may be diagnosed based on:  Your medical history.  A physical exam.  Blood tests.  Urine tests. You may also have other tests, including:  Ultrasound of the pelvis.  MRI of the head. How is this treated? Treatment for this condition depends on the cause. Treatment may include lifestyle changes, surgery, or medicine. Follow these instructions at home:  Eat a healthy diet. A healthy diet includes lots of fruits and vegetables, low-fat dairy products, lean meats, and foods that contain fiber.  Maintain a healthy weight. Talk to your health care provider before trying any new diet or exercise plan  Get regular exercise. Aim for 30 minutes of moderate-intensity activity 5 times a week. Examples of moderate-intensity  activity include walking and yoga. Be sure to talk with your health care provider before starting any exercise routine.  Take over-the-counter and prescription medicines only as told by your health care provider.  Keep all follow-up visits. This is important.      Contact a health care provider if:  You have pelvic pain.  You gain an unusual amount of weight.  You have an unusual amount of hair growth. Summary  Primary amenorrhea is when a female has not started having periods by the time she is 30 years old.  This condition has many causes, including abnormal ovaries, obesity, extreme weight loss.  Contact a health care provider if you have pelvic pain, gain an unusual amount of weight, or have an unusual amount of hair growth. This information is not intended to replace advice given to you by your health care provider. Make sure you discuss any questions you have with your health care provider. Document Revised: 07/11/2020 Document Reviewed: 07/11/2020 Elsevier Patient Education  2021 [employer].

## 2021-02-03 ENCOUNTER — Emergency Department (HOSPITAL_COMMUNITY)
Admission: EM | Admit: 2021-02-03 | Discharge: 2021-02-03 | Disposition: A | Discharge status: Home / Self Care | Payer: BC Managed Care – PPO | Attending: Emergency Medicine | Admitting: Emergency Medicine

## 2021-02-03 DIAGNOSIS — Z7951 Long term (current) use of inhaled steroids: Secondary | ICD-10-CM | POA: Diagnosis not present

## 2021-02-03 DIAGNOSIS — Z8616 Personal history of COVID-19: Secondary | ICD-10-CM | POA: Insufficient documentation

## 2021-02-03 DIAGNOSIS — Z3A01 Less than 8 weeks gestation of pregnancy: Secondary | ICD-10-CM | POA: Insufficient documentation

## 2021-02-03 DIAGNOSIS — O218 Other vomiting complicating pregnancy: Secondary | ICD-10-CM | POA: Diagnosis not present

## 2021-02-03 DIAGNOSIS — J45909 Unspecified asthma, uncomplicated: Secondary | ICD-10-CM | POA: Diagnosis not present

## 2021-02-03 DIAGNOSIS — Z3490 Encounter for supervision of normal pregnancy, unspecified, unspecified trimester: Secondary | ICD-10-CM

## 2021-02-03 LAB — POC URINE PREG, ED: Preg Test, Ur: POSITIVE — AB

## 2021-02-03 MED ORDER — PRENATAL COMPLETE 14-0.4 MG PO TABS
1.0000 | ORAL_TABLET | Freq: Every day | ORAL | 3 refills | Status: DC
Start: 2021-02-03 — End: 2022-06-16

## 2021-02-03 NOTE — Discharge Instructions (Signed)
Based on your l1st day of your last menstrual period (Dec 29, 2020) your estimated gestational age is 5 week 1 day.  Your due date is Oct 29,2022 and your estimated date of conception is Feb 5th, 2022.  Start taking prenatal vitamins.  Follow up with an OB GYn doctor

## 2021-02-03 NOTE — ED Provider Notes (Signed)
[facility] EMERGENCY DEPARTMENT Provider Note   CSN: [id number] Arrival date & time: 02/03/21  1106     History Chief Complaint  Patient presents with  . Possible Pregnancy    Veronica [name] is a 30 y.o. female.  HPI   Patient presents to the ED with questions about a positive pregnancy test.  Patient states her last menstrual period was January 22.  She is several days late for her menstrual period this month.  Patient did a home pregnancy test that was positive.  Patient states is her second pregnancy.  She is having some nausea and occasional lightheadedness but not having any abdominal pain.  No fevers or chills.  No vaginal bleeding.  Patient primarily had questions about her conception date.  Patient states she has been with her boyfriend for several years.  She states she had a slip up and had intercourse with another person the end of December.  She wants to know when she conceived.  Patient states her menstrual period in January was normal.  Past Medical History:  Diagnosis Date  . Abdominal pain 01/06/2012  . Acute tension headache 07/27/2019  . Anemia   . Ankle pain   . Asthma   . Birth control counseling 08/26/2011  . BMI 40.0-44.9, adult (HCC) 02/27/2011  . GERD (gastroesophageal reflux disease)   . Hiatal hernia   . Obesity (BMI 30-39.9)   . Secondary amenorrhea 03/25/2018    Patient Active Problem List   Diagnosis Date Noted  . Anxiety 11/26/2020  . Asthma 08/31/2020  . Vaginal trichomoniasis 06/04/2020  . Palpitations 06/04/2020  . Back pain 02/13/2020  . Hiatal hernia 11/02/2019  . Dyspnea 11/02/2019  . Epigastric pain 09/13/2019  . Wheezing 08/27/2019  . BMI 45.0-49.9, adult (HCC) 08/19/2019  . COVID-19 virus detected 05/19/2019  . Subclinical hypothyroidism 05/19/2019  . Pre-diabetes 05/12/2019  . GAD (generalized anxiety disorder) 05/09/2019  . Seasonal allergies 03/25/2018  . Costochondritis 01/25/2018  . Healthcare  maintenance 10/20/2011  . Birth control counseling 08/26/2011  . GERD (gastroesophageal reflux disease) 03/28/2011  . Anemia 02/27/2011    Past Surgical History:  Procedure Laterality Date  . CESAREAN SECTION  09/02/2010   For macrosomia, but son was 7 lb 12 oz  . TONSILLECTOMY AND ADENOIDECTOMY       OB History   No obstetric history on file.     Family History  Problem Relation Age of Onset  . Diabetes Mother   . Hypertension Mother   . Obesity Mother   . Heart attack Mother 70       Death  . Obesity Sister   . Osteochondroma Sister   . Diabetes Sister   . Hypertension Sister     Social History   Tobacco Use  . Smoking status: Never Smoker  . Smokeless tobacco: Never Used  Vaping Use  . Vaping Use: Never used  Substance Use Topics  . Alcohol use: No  . Drug use: No    Home Medications Prior to Admission medications   Medication Sig Start Date End Date Taking? Authorizing Provider  Prenatal Vit-Fe Fumarate-FA (PRENATAL COMPLETE) 14-0.4 MG TABS Take 1 tablet by mouth daily. 02/03/21  Yes Linwood Dibbles, MD  albuterol (VENTOLIN HFA) 108 (90 Base) MCG/ACT inhaler INHALE 2 PUFFS BY MOUTH EVERY 4 HOURS AS NEEDED FOR WHEEZING OR SHORTNESS OF BREATH (COUGH). 10/15/20   Allayne Stack, DO  busPIRone (BUSPAR) 10 MG tablet Take 1 tablet by mouth twice daily 10/15/20  Leticia Penna N, DO  fluticasone (FLONASE) 50 MCG/ACT nasal spray Place 1 spray into both nostrils daily. 10/29/20   Shirlean Mylar, MD  pantoprazole (PROTONIX) 40 MG tablet Take 1 tablet by mouth daily 01/28/21   Meccariello, Solmon Ice, DO  sertraline (ZOLOFT) 100 MG tablet Take 1 tablet by mouth once daily 08/06/20   Allayne Stack, DO  SPRINTEC 28 0.25-35 MG-MCG tablet Take 1 tablet by mouth once daily 06/04/20   Allayne Stack, DO  famotidine (PEPCID) 20 MG tablet Take 1 tablet (20 mg total) by mouth 2 (two) times daily. Patient not taking: No sig reported 04/09/18 05/24/19  Berton Bon, MD     Allergies    Morphine and related and Motrin [ibuprofen]  Review of Systems   Review of Systems  All other systems reviewed and are negative.   Physical Exam Updated Vital Signs BP (!) 110/53 (BP Location: Right Arm)   Pulse 82   Temp 97.9 F (36.6 C) (Oral)   Resp 18   SpO2 100%   Physical Exam Vitals and nursing note reviewed.  Constitutional:      General: She is not in acute distress.    Appearance: She is well-groomed and overweight.  HENT:     Head: Normocephalic and atraumatic.     Right Ear: External ear normal.     Left Ear: External ear normal.  Eyes:     General: No scleral icterus.       Right eye: No discharge.        Left eye: No discharge.     Conjunctiva/sclera: Conjunctivae normal.  Neck:     Trachea: No tracheal deviation.  Cardiovascular:     Rate and Rhythm: Normal rate.  Pulmonary:     Effort: Pulmonary effort is normal. No respiratory distress.     Breath sounds: No stridor.  Abdominal:     General: There is no distension.     Tenderness: There is no abdominal tenderness. There is no guarding.  Musculoskeletal:        General: No swelling or deformity.     Cervical back: Neck supple.  Skin:    General: Skin is warm and dry.     Findings: No rash.  Neurological:     Mental Status: She is alert.     Cranial Nerves: Cranial nerve deficit: no gross deficits.     ED Results / Procedures / Treatments   Labs (all labs ordered are listed, but only abnormal results are displayed) Labs Reviewed  POC URINE PREG, ED - Abnormal; Notable for the following components:      Result Value   Preg Test, Ur POSITIVE (*)    All other components within normal limits    EKG None  Radiology No results found.  Procedures Procedures   Medications Ordered in ED Medications - No data to display  ED Course  I have reviewed the triage vital signs and the nursing notes.  Pertinent labs & imaging results that were available during my care of  the patient were reviewed by me and considered in my medical decision making (see chart for details).  Clinical Course as of 02/03/21 1151  Sun Feb 03, 2021  1128 LMP  Jan 22 [JK]  1150 Patient's pregnancy test is positive [JK]    Clinical Course User Index [JK] Linwood Dibbles, MD   MDM Rules/Calculators/A&P  Patient presented with questions about her date of conception.  Explained to the patient that we would not be able to tell exactly her date of conception based on laboratory tests or ultrasounds.  However you can get an estimated range.  I explained the patient that pregnancy is based on her last menstrual period and not the conception date.  Conception would occur after her "1st day of pregnancy".  BAsed on dates  Her EDC is 5 week 1 day, EDC is Feb 5th and [facility] is oc2 29,22 Final Clinical Impression(s) / ED Diagnoses Final diagnoses:  None    Rx / DC Orders ED Discharge Orders         Ordered    Prenatal Vit-Fe Fumarate-FA (PRENATAL COMPLETE) 14-0.4 MG TABS  Daily        02/03/21 1144           Linwood Dibbles, MD 02/03/21 1151

## 2021-02-03 NOTE — ED Triage Notes (Signed)
Pt presents to the ED requesting a pregnancy work up after testing positive yesterday evening. LMP 01/18th. Pt denies abdominal pain.

## 2021-02-05 ENCOUNTER — Other Ambulatory Visit: Payer: BC Managed Care – PPO

## 2021-02-05 ENCOUNTER — Other Ambulatory Visit: Payer: Self-pay

## 2021-02-05 DIAGNOSIS — Z3481 Encounter for supervision of other normal pregnancy, first trimester: Secondary | ICD-10-CM

## 2021-02-07 LAB — OBSTETRIC PANEL, INCLUDING HIV
Antibody Screen: NEGATIVE
Basophils Absolute: 0 10*3/uL (ref 0.0–0.2)
Basos: 0 %
EOS (ABSOLUTE): 0.1 10*3/uL (ref 0.0–0.4)
Eos: 1 %
HIV Screen 4th Generation wRfx: NONREACTIVE
Hematocrit: 32.2 % — ABNORMAL LOW (ref 34.0–46.6)
Hemoglobin: 11 g/dL — ABNORMAL LOW (ref 11.1–15.9)
Hepatitis B Surface Ag: NEGATIVE
Immature Grans (Abs): 0 10*3/uL (ref 0.0–0.1)
Immature Granulocytes: 1 %
Lymphocytes Absolute: 2.6 10*3/uL (ref 0.7–3.1)
Lymphs: 33 %
MCH: 27.8 pg (ref 26.6–33.0)
MCHC: 34.2 g/dL (ref 31.5–35.7)
MCV: 82 fL (ref 79–97)
Monocytes Absolute: 0.5 10*3/uL (ref 0.1–0.9)
Monocytes: 6 %
Neutrophils Absolute: 4.8 10*3/uL (ref 1.4–7.0)
Neutrophils: 59 %
Platelets: 342 10*3/uL (ref 150–450)
RBC: 3.95 x10E6/uL (ref 3.77–5.28)
RDW: 13.5 % (ref 11.7–15.4)
RPR Ser Ql: NONREACTIVE
Rh Factor: POSITIVE
Rubella Antibodies, IGG: 8.83 index (ref >0.99)
WBC: 8 10*3/uL (ref 3.4–10.8)

## 2021-02-07 LAB — CULTURE, OB URINE

## 2021-02-07 LAB — URINE CULTURE, OB REFLEX

## 2021-02-07 LAB — HGB FRACTIONATION CASCADE
Hgb A2: 2.6 % (ref 1.8–3.2)
Hgb A: 97.4 % (ref 96.4–98.8)
Hgb F: 0 % (ref 0.0–2.0)
Hgb S: 0 %

## 2021-02-07 LAB — HEPATITIS C ANTIBODY: Hep C Virus Ab: <0.1 s/co ratio (ref 0.0–0.9)

## 2021-02-18 ENCOUNTER — Other Ambulatory Visit: Payer: Self-pay | Admitting: Family Medicine

## 2021-02-18 ENCOUNTER — Encounter: Payer: BC Managed Care – PPO | Admitting: Family Medicine

## 2021-02-18 ENCOUNTER — Encounter: Payer: Self-pay | Admitting: Family Medicine

## 2021-02-18 DIAGNOSIS — O219 Vomiting of pregnancy, unspecified: Secondary | ICD-10-CM

## 2021-02-18 MED ORDER — DOXYLAMINE-PYRIDOXINE 10-10 MG PO TBEC: DELAYED_RELEASE_TABLET | ORAL | 0 refills | Status: DC

## 2021-02-18 NOTE — Telephone Encounter (Signed)
Patient calls nurse line regarding previous mychart message. Patient reports that she has been unable to keep down food but is tolerating fluids. Scheduled patient in clinic on Wednesday regarding nausea and vomiting. Patient does not have new OB visit with [facility] until April 13th. Advised patient to also call OB office to see if sooner appointment could be scheduled.   Provided patient with strict MAU precautions.   Veronda Prude, RN

## 2021-02-19 ENCOUNTER — Telehealth: Payer: Self-pay

## 2021-02-19 NOTE — Telephone Encounter (Addendum)
Returned patients call re nausea in early pregnancy. Patient did not answer. LM that a prescription was sent to her Pharmacy yesterday for N/V. Advised calling the office with further questions or concerns.

## 2021-02-19 NOTE — Telephone Encounter (Signed)
Pt left VM on nurse line requesting help with nausea and vomitting in early pregnancy. Per chart review new OB appt is scheduled for 03/20/21.

## 2021-02-20 ENCOUNTER — Other Ambulatory Visit: Payer: Self-pay

## 2021-02-20 ENCOUNTER — Encounter (HOSPITAL_COMMUNITY): Payer: Self-pay | Admitting: Obstetrics & Gynecology

## 2021-02-20 ENCOUNTER — Telehealth: Payer: Self-pay

## 2021-02-20 ENCOUNTER — Ambulatory Visit (INDEPENDENT_AMBULATORY_CARE_PROVIDER_SITE_OTHER): Payer: BC Managed Care – PPO | Admitting: Family Medicine

## 2021-02-20 ENCOUNTER — Inpatient Hospital Stay (HOSPITAL_COMMUNITY)
Admission: AD | Admit: 2021-02-20 | Discharge: 2021-02-21 | Disposition: A | Discharge status: Home / Self Care | Payer: BC Managed Care – PPO | Attending: Obstetrics & Gynecology | Admitting: Obstetrics & Gynecology

## 2021-02-20 VITALS — BP 121/60 | HR 73 | Ht 62.0 in | Wt 278.6 lb

## 2021-02-20 DIAGNOSIS — Z79899 Other long term (current) drug therapy: Secondary | ICD-10-CM | POA: Insufficient documentation

## 2021-02-20 DIAGNOSIS — R110 Nausea: Secondary | ICD-10-CM

## 2021-02-20 DIAGNOSIS — Z560 Unemployment, unspecified: Secondary | ICD-10-CM | POA: Insufficient documentation

## 2021-02-20 DIAGNOSIS — K219 Gastro-esophageal reflux disease without esophagitis: Secondary | ICD-10-CM | POA: Insufficient documentation

## 2021-02-20 DIAGNOSIS — Z3A01 Less than 8 weeks gestation of pregnancy: Secondary | ICD-10-CM | POA: Insufficient documentation

## 2021-02-20 DIAGNOSIS — K449 Diaphragmatic hernia without obstruction or gangrene: Secondary | ICD-10-CM | POA: Insufficient documentation

## 2021-02-20 DIAGNOSIS — Z886 Allergy status to analgesic agent status: Secondary | ICD-10-CM | POA: Diagnosis not present

## 2021-02-20 DIAGNOSIS — O99891 Other specified diseases and conditions complicating pregnancy: Secondary | ICD-10-CM | POA: Diagnosis not present

## 2021-02-20 DIAGNOSIS — Z885 Allergy status to narcotic agent status: Secondary | ICD-10-CM | POA: Diagnosis not present

## 2021-02-20 DIAGNOSIS — Z8759 Personal history of other complications of pregnancy, childbirth and the puerperium: Secondary | ICD-10-CM | POA: Diagnosis not present

## 2021-02-20 DIAGNOSIS — O26891 Other specified pregnancy related conditions, first trimester: Secondary | ICD-10-CM | POA: Insufficient documentation

## 2021-02-20 DIAGNOSIS — O219 Vomiting of pregnancy, unspecified: Secondary | ICD-10-CM | POA: Diagnosis not present

## 2021-02-20 DIAGNOSIS — K8080 Other cholelithiasis without obstruction: Secondary | ICD-10-CM | POA: Diagnosis not present

## 2021-02-20 DIAGNOSIS — O99611 Diseases of the digestive system complicating pregnancy, first trimester: Secondary | ICD-10-CM

## 2021-02-20 DIAGNOSIS — R1013 Epigastric pain: Secondary | ICD-10-CM | POA: Diagnosis not present

## 2021-02-20 LAB — URINALYSIS, ROUTINE W REFLEX MICROSCOPIC
Bilirubin Urine: NEGATIVE
Glucose, UA: NEGATIVE mg/dL
Hgb urine dipstick: NEGATIVE
Ketones, ur: NEGATIVE mg/dL
Leukocytes,Ua: NEGATIVE
Nitrite: NEGATIVE
Protein, ur: NEGATIVE mg/dL
Specific Gravity, Urine: 1.021 (ref 1.005–1.030)
pH: 6 (ref 5.0–8.0)

## 2021-02-20 LAB — COMPREHENSIVE METABOLIC PANEL
ALT: 12 U/L (ref 0–44)
AST: 9 U/L — ABNORMAL LOW (ref 15–41)
Albumin: 3.3 g/dL — ABNORMAL LOW (ref 3.5–5.0)
Alkaline Phosphatase: 48 U/L (ref 38–126)
Anion gap: 6 (ref 5–15)
BUN: 15 mg/dL (ref 6–20)
CO2: 26 mmol/L (ref 22–32)
Calcium: 9.1 mg/dL (ref 8.9–10.3)
Chloride: 101 mmol/L (ref 98–111)
Creatinine, Ser: 0.89 mg/dL (ref 0.44–1.00)
GFR, Estimated: >60 mL/min (ref >60)
Glucose, Bld: 90 mg/dL (ref 70–99)
Potassium: 3.7 mmol/L (ref 3.5–5.1)
Sodium: 133 mmol/L — ABNORMAL LOW (ref 135–145)
Total Bilirubin: 0.5 mg/dL (ref 0.3–1.2)
Total Protein: 7 g/dL (ref 6.5–8.1)

## 2021-02-20 LAB — CBC
HCT: 32.8 % — ABNORMAL LOW (ref 36.0–46.0)
Hemoglobin: 10.8 g/dL — ABNORMAL LOW (ref 12.0–15.0)
MCH: 27.6 pg (ref 26.0–34.0)
MCHC: 32.9 g/dL (ref 30.0–36.0)
MCV: 83.9 fL (ref 80.0–100.0)
Platelets: 315 10*3/uL (ref 150–400)
RBC: 3.91 MIL/uL (ref 3.87–5.11)
RDW: 14.3 % (ref 11.5–15.5)
WBC: 10.1 10*3/uL (ref 4.0–10.5)
nRBC: 0 % (ref 0.0–0.2)

## 2021-02-20 LAB — LIPASE, BLOOD: Lipase: 27 U/L (ref 11–51)

## 2021-02-20 LAB — AMYLASE: Amylase: 65 U/L (ref 28–100)

## 2021-02-20 MED ORDER — LIDOCAINE VISCOUS HCL 2 % MT SOLN
15.0000 mL | Freq: Once | OROMUCOSAL | Status: AC
  Administered 2021-02-20: 15 mL via ORAL
  Filled 2021-02-20: qty 15

## 2021-02-20 MED ORDER — ALUM & MAG HYDROXIDE-SIMETH 200-200-20 MG/5ML PO SUSP
30.0000 mL | Freq: Once | ORAL | Status: AC
  Administered 2021-02-20: 30 mL via ORAL
  Filled 2021-02-20: qty 30

## 2021-02-20 MED ORDER — DOXYLAMINE-PYRIDOXINE 10-10 MG PO TBEC: 1.0000 | DELAYED_RELEASE_TABLET | Freq: Two times a day (BID) | ORAL | 0 refills | Status: DC | PRN

## 2021-02-20 MED ORDER — FAMOTIDINE 20 MG PO TABS: 20.0000 mg | ORAL_TABLET | Freq: Two times a day (BID) | ORAL | 1 refills | Status: DC

## 2021-02-20 NOTE — ED Triage Notes (Signed)
Emergency Medicine Provider Triage Evaluation Note  Veronica [name] , a 30 y.o. G56P1001 female   was evaluated in triage.  Pt complains of Epigastric/ RUQ pain and Nausea .  Review of Systems  Positive: Epigastric pain Negative: fever  Physical Exam  BP 122/69 (BP Location: Right Arm)   Pulse 80   Temp 98.1 F (36.7 C) (Oral)   Resp 18   LMP 09/23/2020 (Approximate)   SpO2 100%  Gen:   Awake, no distress   HEENT:  Atraumatic  Resp:  Normal effort  Cardiac:  Normal rate  Abd:   Nondistended, nontender  MSK:   Moves extremities without difficulty  Neuro:  Speech clear   Medical Decision Making  Medically screening exam initiated at 8:15 PM.  Appropriate orders placed.  Veronica [name] was informed that the remainder of the evaluation will be completed by another provider, this initial triage assessment does not replace that evaluation, and the importance of remaining in the ED until their evaluation is complete.  Clinical Impression  Patient accepted by [facility] to the MAU   Arthor Captain, [name] 02/20/21 2017

## 2021-02-20 NOTE — Assessment & Plan Note (Signed)
VSS. Pt tolerating water and fruit. Discussed alternative nausea treatments during pregnancy. Prescribed Diclegis: Two tablets at bedtime on day 1 and 2; if symptoms persist, take 1 tablet in morning and 2 tablets at bedtime on day 3; if symptoms persist, may increase to 1 tablet in morning, 1 tablet mid-afternoon, and 2 tablets at bedtime on day 4.

## 2021-02-20 NOTE — MAU Provider Note (Addendum)
Chief Complaint: Nausea   Event Date/Time   First Provider Initiated Contact with Patient 02/20/21 2108        SUBJECTIVE HPI: Veronica [name] is a 30 y.o. G2P1000 at [redacted]w[redacted]d by LMP who presents to maternity admissions reporting epigastric pain and nausea.  Has not had vomiting.  Has a long history of acid reflux, has been worked up thoroughly by GI.  Has gallstones, but never removed her gallbladder. States has a hiatal hernia.  Has been trying Pepcid with some results but did not take today. . She denies vaginal bleeding, vaginal itching/burning, urinary symptoms, h/a, dizziness, or fever/chills.    Abdominal Pain This is a recurrent problem. The current episode started today. The problem occurs intermittently. The problem has been unchanged. The pain is located in the epigastric region. The quality of the pain is cramping and dull. The abdominal pain does not radiate. Associated symptoms include nausea. Pertinent negatives include no anorexia, constipation, diarrhea, dysuria, fever, frequency, myalgias or vomiting. Nothing aggravates the pain. The pain is relieved by nothing. She has tried H2 blockers for the symptoms. The treatment provided mild relief. Her past medical history is significant for gallstones.   RN Note: Veronica [name] is a 30 y.o. at [redacted]w[redacted]d here in MAU from [facility] reporting: Abdominal pain on her mid upper and right lower abdomen for three days. Nausea for a couple weeks. Denies vomiting.  Pain score: 5/10  Past Medical History:  Diagnosis Date   Abdominal pain 01/06/2012   Acute tension headache 07/27/2019   Anemia    Ankle pain    Asthma    Birth control counseling 08/26/2011   BMI 40.0-44.9, adult (HCC) 02/27/2011   GERD (gastroesophageal reflux disease)    Hiatal hernia    Obesity (BMI 30-39.9)    Secondary amenorrhea 03/25/2018   Past Surgical History:  Procedure Laterality Date   CESAREAN SECTION  09/02/2010   For macrosomia, but son was 7 lb 12 oz   TONSILLECTOMY  AND ADENOIDECTOMY     Social History   Socioeconomic History   Marital status: Single    Spouse name: Not on file   Number of children: Not on file   Years of education: Not on file   Highest education level: Not on file  Occupational History   Occupation: Designer, fashion/clothing: UNEMPLOYED    Comment: sales  Tobacco Use   Smoking status: Never Smoker   Smokeless tobacco: Never Used  Building services engineer Use: Never used  Substance and Sexual Activity   Alcohol use: No   Drug use: No   Sexual activity: Yes    Birth control/protection: None  Other Topics Concern   Not on file  Social History Narrative   Lives son ([name] 09/02/2010 ), sister Martie Lee Spickard 10/28/94), and mom (Mary Eichenberger-Gillis) and mom's fiance.    Attending [employer] to obtain GED.   Mom's fiance smokes outside house.    Pet: dog               Social Determinants of Health   Financial Resource Strain: Not on file  Food Insecurity: Not on file  Transportation Needs: Not on file  Physical Activity: Not on file  Stress: Not on file  Social Connections: Not on file  Intimate Partner Violence: Not on file   No current facility-administered medications on file prior to encounter.   Current Outpatient Medications on File Prior to Encounter  Medication Sig Dispense Refill  famotidine (PEPCID) 20 MG tablet Take 1 tablet (20 mg total) by mouth 2 (two) times daily. 60 tablet 1   Prenatal Vit-Fe Fumarate-FA (PRENATAL COMPLETE) 14-0.4 MG TABS Take 1 tablet by mouth daily. 60 tablet 3   albuterol (VENTOLIN HFA) 108 (90 Base) MCG/ACT inhaler INHALE 2 PUFFS BY MOUTH EVERY 4 HOURS AS NEEDED FOR WHEEZING OR SHORTNESS OF BREATH (COUGH). 18 g 0   busPIRone (BUSPAR) 10 MG tablet Take 1 tablet by mouth twice daily 180 tablet 0   Doxylamine-Pyridoxine 10-10 MG TBEC Take 1 each by mouth 2 (two) times daily as needed. 60 tablet 0   fluticasone (FLONASE) 50 MCG/ACT nasal spray Place 1 spray into both nostrils daily. 16 g 1    sertraline (ZOLOFT) 100 MG tablet Take 1 tablet by mouth once daily 30 tablet 3   Allergies  Allergen Reactions   Morphine And Related Other (See Comments)    Causes "chest to be heavy"   Motrin [Ibuprofen]     Was told to not take this because it would flare stomach issues    I have reviewed patient's Past Medical Hx, Surgical Hx, Family Hx, Social Hx, medications and allergies.   ROS:  Review of Systems  Constitutional: Negative for fever.  Gastrointestinal: Positive for abdominal pain and nausea. Negative for anorexia, constipation, diarrhea and vomiting.  Genitourinary: Negative for dysuria and frequency.  Musculoskeletal: Negative for myalgias.   Review of Systems  Other systems negative   Physical Exam  Physical Exam Patient Vitals for the past 24 hrs:  BP Temp Temp src Pulse Resp SpO2  02/20/21 2100 -- -- -- -- -- 100 %  02/20/21 2013 122/69 98.1 F (36.7 C) Oral 80 18 100 %   Constitutional: Well-developed, well-nourished female in no acute distress.  Cardiovascular: normal rate Respiratory: normal effort GI: Abd soft, non-tender.  MS: Extremities nontender, no edema, normal ROM Neurologic: Alert and oriented x 4.  GU: Neg CVAT.  PELVIC EXAM: deferred  LAB RESULTS Results for orders placed or performed during the hospital encounter of 02/20/21 (from the past 24 hour(s))  Urinalysis, Routine w reflex microscopic Urine, Clean Catch     Status: Abnormal   Collection Time: 02/20/21  8:48 PM  Result Value Ref Range   Color, Urine YELLOW YELLOW   APPearance HAZY (A) CLEAR   Specific Gravity, Urine 1.021 1.005 - 1.030   pH 6.0 5.0 - 8.0   Glucose, UA NEGATIVE NEGATIVE mg/dL   Hgb urine dipstick NEGATIVE NEGATIVE   Bilirubin Urine NEGATIVE NEGATIVE   Ketones, ur NEGATIVE NEGATIVE mg/dL   Protein, ur NEGATIVE NEGATIVE mg/dL   Nitrite NEGATIVE NEGATIVE   Leukocytes,Ua NEGATIVE NEGATIVE  CBC     Status: Abnormal   Collection Time: 02/20/21  9:32 PM  Result  Value Ref Range   WBC 10.1 4.0 - 10.5 K/uL   RBC 3.91 3.87 - 5.11 MIL/uL   Hemoglobin 10.8 (L) 12.0 - 15.0 g/dL   HCT 03.5 (L) 00.9 - 38.1 %   MCV 83.9 80.0 - 100.0 fL   MCH 27.6 26.0 - 34.0 pg   MCHC 32.9 30.0 - 36.0 g/dL   RDW 82.9 93.7 - 16.9 %   Platelets 315 150 - 400 K/uL   nRBC 0.0 0.0 - 0.2 %  Comprehensive metabolic panel     Status: Abnormal   Collection Time: 02/20/21  9:32 PM  Result Value Ref Range   Sodium 133 (L) 135 - 145 mmol/L   Potassium 3.7 3.5 -  5.1 mmol/L   Chloride 101 98 - 111 mmol/L   CO2 26 22 - 32 mmol/L   Glucose, Bld 90 70 - 99 mg/dL   BUN 15 6 - 20 mg/dL   Creatinine, Ser 8.10 0.44 - 1.00 mg/dL   Calcium 9.1 8.9 - 17.5 mg/dL   Total Protein 7.0 6.5 - 8.1 g/dL   Albumin 3.3 (L) 3.5 - 5.0 g/dL   AST 9 (L) 15 - 41 U/L   ALT 12 0 - 44 U/L   Alkaline Phosphatase 48 38 - 126 U/L   Total Bilirubin 0.5 0.3 - 1.2 mg/dL   GFR, Estimated >10 >25 mL/min   Anion gap 6 5 - 15  Amylase     Status: None   Collection Time: 02/20/21  9:32 PM  Result Value Ref Range   Amylase 65 28 - 100 U/L  Lipase, blood     Status: None   Collection Time: 02/20/21  9:32 PM  Result Value Ref Range   Lipase 27 11 - 51 U/L    AB/Positive/-- (03/01 1159)  IMAGING No results found.   MAU Management/MDM: Ordered labs to assess for leukocytosis or signs of biliary obstruction.  These are normal  We gave her a GI cocktail and she did get relief from that. Discussed treatment options for acid reflux and nausea States they called in Diclegis but it never got to pharmacy and they told her insurance would not cover it.  Discussed how to use Unisom and B6 Rx sent for Unisom, B6, Protonix, and Phenergan  ASSESSMENT Pregnancy at [redacted]w[redacted]d Nausea GERD  PLAN Discharge home Rx sent for Unisom, B6, Protonix, and Phenergan for nausea and GERD  Has appt for [place] and new OB Pt stable at time of discharge. Encouraged to return here if she develops worsening of symptoms, increase in  pain, fever, or other concerning symptoms.    Wynelle Bourgeois CNM, MSN Certified Nurse-Midwife 02/20/2021  9:09 PM  Attestation of Attending Supervision of Advanced Practice Provider (PA/CNM/NP): Evaluation and management procedures were performed by the Advanced Practice Provider under my supervision and collaboration.  I have reviewed the Advanced Practice Provider's note and chart, and I agree with the management and plan. I have also made any necessary editorial changes.   Sharon Seller, DO Attending Obstetrician & Gynecologist, [facility], [facility] 02/21/2021 2:00 PM

## 2021-02-20 NOTE — Telephone Encounter (Signed)
Received PA request for doxylamine-pyridoxine. I was not able to complete on covermymeds. However, was redirected to fax all chart notes to [phone]. I have printed recent office visit and added diagnos code and faxed to number above. I am assuming we should get a fax with determination.

## 2021-02-20 NOTE — Assessment & Plan Note (Signed)
G2P1001  30 yo mom who is [redacted]w[redacted]d pregnant by LMP: 12/29/20 normal  EDC: 10/05/21. Dating ultrasound ordered.

## 2021-02-20 NOTE — ED Triage Notes (Signed)
Pt c/o abdominal pain, nausea and vomiting. Pt [redacted]weeks pregnant.

## 2021-02-20 NOTE — Patient Instructions (Signed)
For nausea: Two tablets at bedtime on day 1 and 2; if symptoms persist, take 1 tablet in morning and 2 tablets at bedtime on day 3; if symptoms persist, may increase to 1 tablet in morning, 1 tablet mid-afternoon, and 2 tablets at bedtime on day 4.   Go get your ultrasound as scheduled.   Follow up for your initial prenatal visit.   Take Care,  Dr. Rachael Darby

## 2021-02-20 NOTE — Assessment & Plan Note (Signed)
Pepcid BID PRN. Discussed avoidance of triggers. She is currently pregnant. Counseled pt on diet and lifestyle changes to help prevent acid reflux sx including avoiding greasy/spicy foots, and elevation of the head of the pt's bed.

## 2021-02-20 NOTE — MAU Note (Signed)
. .  Veronica [name] is a 30 y.o. at [redacted]w[redacted]d here in MAU from [facility] reporting: Abdominal pain on her mid upper and right lower abdomen for three days. Nausea for a couple weeks. Denies vomiting.  Pain score: 5/10 Vitals:   02/20/21 2013 02/20/21 2100  BP: 122/69   Pulse: 80   Resp: 18   Temp: 98.1 F (36.7 C)   SpO2: 100% 100%   Lab orders placed from triage: UA

## 2021-02-20 NOTE — Progress Notes (Signed)
   SUBJECTIVE:   CHIEF COMPLAINT / HPI:   Chief Complaint  Patient presents with  . Nausea  . Heartburn     Veronica [name] is a 30 y.o. female here for persistent nausea and vomiting. Nausea since she found out she was pregnant. She is unable to keep food down. She able fruit and drink water.   Veronica [name] reports heart burn that is disrupting her sleep. Sleeping about 5 hours a night. Endorses epigastric abdominal pain. Has acid reflux prior to pregnancy. She previously took pantoprazole but it is not working now.    PERTINENT  PMH / PSH: reviewed and updated as appropriate   OBJECTIVE:   BP 121/60   Pulse 73   Ht 5\' 2"  (1.575 m)   Wt 278 lb 9.6 oz (126.4 kg)   LMP 09/23/2020 (Approximate)   SpO2 99%   BMI 50.96 kg/m    GEN: well appearing female, in no acute distress  CV: regular rate and rhythm, no murmurs appreciated  RESP: no increased work of breathing, clear to ascultation bilaterally ABD: Bowel sounds present. Soft, non distended. Mild epigastric tenderness. Uterus below the umbilicus.  SKIN: warm, dry   ASSESSMENT/PLAN:   Less than [redacted] weeks gestation of pregnancy  G2P1001  57 yo mom who is [redacted]w[redacted]d pregnant by LMP: 12/29/20 normal  EDC: 10/05/21. Dating ultrasound ordered.   GERD (gastroesophageal reflux disease) Pepcid BID PRN. Discussed avoidance of triggers. She is currently pregnant. Counseled Veronica [name] on diet and lifestyle changes to help prevent acid reflux sx including avoiding greasy/spicy foots, and elevation of the head of the Veronica [name]'s bed.   Nausea and vomiting during pregnancy VSS. Veronica [name] tolerating water and fruit. Discussed alternative nausea treatments during pregnancy. Prescribed Diclegis: Two tablets at bedtime on day 1 and 2; if symptoms persist, take 1 tablet in morning and 2 tablets at bedtime on day 3; if symptoms persist, may increase to 1 tablet in morning, 1 tablet mid-afternoon, and 2 tablets at bedtime on day 4.       10/07/21, DO PGY-2, [place]  [facility] 02/20/2021

## 2021-02-21 DIAGNOSIS — O219 Vomiting of pregnancy, unspecified: Secondary | ICD-10-CM | POA: Diagnosis not present

## 2021-02-21 MED ORDER — PROMETHAZINE HCL 25 MG PO TABS: 25.0000 mg | ORAL_TABLET | Freq: Four times a day (QID) | ORAL | 2 refills | Status: DC | PRN

## 2021-02-21 MED ORDER — DOXYLAMINE SUCCINATE (SLEEP) 25 MG PO TABS: 25.0000 mg | ORAL_TABLET | Freq: Every evening | ORAL | 0 refills | Status: DC | PRN

## 2021-02-21 MED ORDER — VITAMIN B-6 50 MG PO TABS: 50.0000 mg | ORAL_TABLET | Freq: Every evening | ORAL | 3 refills | Status: DC

## 2021-02-21 MED ORDER — PANTOPRAZOLE SODIUM 20 MG PO TBEC
20.0000 mg | DELAYED_RELEASE_TABLET | Freq: Every day | ORAL | 0 refills | Status: DC
Start: 2021-02-21 — End: 2021-04-17

## 2021-02-21 NOTE — Discharge Instructions (Signed)
Morning Sickness  Morning sickness is when you feel like you may vomit (feel nauseous) during pregnancy. Sometimes, you may vomit. Morning sickness most often happens in the morning, but it can also happen at any time of the day. Some women may have morning sickness that makes them vomit all the time. This is a more serious problem that needs treatment. What are the causes? The cause of this condition is not known. What increases the risk?  You had vomiting or a feeling like you may vomit before your pregnancy.  You had morning sickness in another pregnancy.  You are pregnant with more than one baby, such as twins. What are the signs or symptoms?  Feeling like you may vomit.  Vomiting. How is this treated? Treatment is usually not needed for this condition. You may only need to change what you eat. In some cases, your doctor may give you some things to take for your condition. These include:  Vitamin B6 supplements.  Medicines to treat the feeling that you may vomit.  Ginger. Follow these instructions at home: Medicines  Take over-the-counter and prescription medicines only as told by your doctor. Do not take any medicines until you talk with your doctor about them first.  Take multivitamins before you get pregnant. These can stop or lessen the symptoms of morning sickness. Eating and drinking  Eat dry toast or crackers before getting out of bed.  Eat 5 or 6 small meals a day.  Eat dry and bland foods like rice and baked potatoes.  Do not eat greasy, fatty, or spicy foods.  Have someone cook for you if the smell of food causes you to vomit or to feel like you may vomit.  If you feel like you may vomit after taking prenatal vitamins, take them at night or with a snack.  Eat protein foods when you need a snack. Nuts, yogurt, and cheese are good choices.  Drink fluids throughout the day.  Try ginger ale made with real ginger, ginger tea made from fresh grated ginger, or  ginger candies. General instructions  Do not smoke or use any products that contain nicotine or tobacco. If you need help quitting, ask your doctor.  Use an air purifier to keep the air in your house free of smells.  Get lots of fresh air.  Try to avoid smells that make you feel sick.  Try wearing an acupressure wristband. This is a wristband that is used to treat seasickness.  Try a treatment called acupuncture. In this treatment, a doctor puts needles into certain areas of your body to make you feel better. Contact a doctor if:  You need medicine to feel better.  You feel dizzy or light-headed.  You are losing weight. Get help right away if:  The feeling that you may vomit will not go away, or you cannot stop vomiting.  You faint.  You have very bad pain in your belly. Summary  Morning sickness is when you feel like you may vomit (feel nauseous) during pregnancy.  You may feel sick in the morning, but you can feel this way at any time of the day.  Making some changes to what you eat may help your symptoms go away. This information is not intended to replace advice given to you by your health care provider. Make sure you discuss any questions you have with your health care provider. Document Revised: 07/09/2020 Document Reviewed: 06/18/2020 Elsevier Patient Education  2021 [employer]. AboveDiscount.com.cy.html">  First Trimester  of Pregnancy  The first trimester of pregnancy starts on the first day of your last menstrual period until the end of week 12. This is also called months 1 through 3 of pregnancy. Body changes during your first trimester Your body goes through many changes during pregnancy. The changes usually return to normal after your baby is born. Physical changes  You may gain or lose weight.  Your breasts may grow larger and hurt. The area around your nipples may get darker.  Dark spots or blotches may develop on your  face.  You may have changes in your hair. Health changes  You may feel like you might vomit (nauseous), and you may vomit.  You may have heartburn.  You may have headaches.  You may have trouble pooping (constipation).  Your gums may bleed. Other changes  You may get tired easily.  You may pee (urinate) more often.  Your menstrual periods will stop.  You may not feel hungry.  You may want to eat certain kinds of food.  You may have changes in your emotions from day to day.  You may have more dreams. Follow these instructions at home: Medicines  Take over-the-counter and prescription medicines only as told by your doctor. Some medicines are not safe during pregnancy.  Take a prenatal vitamin that contains at least 600 micrograms (mcg) of folic acid. Eating and drinking  Eat healthy meals that include: ? Fresh fruits and vegetables. ? Whole grains. ? Good sources of protein, such as meat, eggs, or tofu. ? Low-fat dairy products.  Avoid raw meat and unpasteurized juice, milk, and cheese.  If you feel like you may vomit, or you vomit: ? Eat 4 or 5 small meals a day instead of 3 large meals. ? Try eating a few soda crackers. ? Drink liquids between meals instead of during meals.  You may need to take these actions to prevent or treat trouble pooping: ? Drink enough fluids to keep your pee (urine) pale yellow. ? Eat foods that are high in fiber. These include beans, whole grains, and fresh fruits and vegetables. ? Limit foods that are high in fat and sugar. These include fried or sweet foods. Activity  Exercise only as told by your doctor. Most people can do their usual exercise routine during pregnancy.  Stop exercising if you have cramps or pain in your lower belly (abdomen) or low back.  Do not exercise if it is too hot or too humid, or if you are in a place of great height (high altitude).  Avoid heavy lifting.  If you choose to, you may have sex unless  your doctor tells you not to. Relieving pain and discomfort  Wear a good support bra if your breasts are sore.  Rest with your legs raised (elevated) if you have leg cramps or low back pain.  If you have bulging veins (varicose veins) in your legs: ? Wear support hose as told by your doctor. ? Raise your feet for 15 minutes, 3-4 times a day. ? Limit salt in your food. Safety  Wear your seat belt at all times when you are in a car.  Talk with your doctor if someone is hurting you or yelling at you.  Talk with your doctor if you are feeling sad or have thoughts of hurting yourself. Lifestyle  Do not use hot tubs, steam rooms, or saunas.  Do not douche. Do not use tampons or scented sanitary pads.  Do not use herbal medicines,  illegal drugs, or medicines that are not approved by your doctor. Do not drink alcohol.  Do not smoke or use any products that contain nicotine or tobacco. If you need help quitting, ask your doctor.  Avoid cat litter boxes and soil that is used by cats. These carry germs that can cause harm to the baby and can cause a loss of your baby by miscarriage or stillbirth. General instructions  Keep all follow-up visits. This is important.  Ask for help if you need counseling or if you need help with nutrition. Your doctor can give you advice or tell you where to go for help.  Visit your dentist. At home, brush your teeth with a soft toothbrush. Floss gently.  Write down your questions. Take them to your prenatal visits. Where to find more information  American Pregnancy Association: americanpregnancy.org  [employer] of Obstetricians and Gynecologists: www.acog.org  Office on Women's Health: MightyReward.co.nz Contact a doctor if:  You are dizzy.  You have a fever.  You have mild cramps or pressure in your lower belly.  You have a nagging pain in your belly area.  You continue to feel like you may vomit, you vomit, or you have watery  poop (diarrhea) for 24 hours or longer.  You have a bad-smelling fluid coming from your vagina.  You have pain when you pee.  You are exposed to a disease that spreads from person to person, such as chickenpox, measles, Zika virus, HIV, or hepatitis. Get help right away if:  You have spotting or bleeding from your vagina.  You have very bad belly cramping or pain.  You have shortness of breath or chest pain.  You have any kind of injury, such as from a fall or a car crash.  You have new or increased pain, swelling, or redness in an arm or leg. Summary  The first trimester of pregnancy starts on the first day of your last menstrual period until the end of week 12 (months 1 through 3).  Eat 4 or 5 small meals a day instead of 3 large meals.  Do not smoke or use any products that contain nicotine or tobacco. If you need help quitting, ask your doctor.  Keep all follow-up visits. This information is not intended to replace advice given to you by your health care provider. Make sure you discuss any questions you have with your health care provider. Document Revised: 05/02/2020 Document Reviewed: 03/08/2020 Elsevier Patient Education  2021 [employer].

## 2021-02-28 ENCOUNTER — Other Ambulatory Visit: Payer: Self-pay

## 2021-02-28 ENCOUNTER — Ambulatory Visit
Admission: RE | Admit: 2021-02-28 | Discharge: 2021-02-28 | Disposition: A | Discharge status: Home / Self Care | Payer: BC Managed Care – PPO | Source: Ambulatory Visit | Attending: Family Medicine | Admitting: Family Medicine

## 2021-02-28 DIAGNOSIS — Z3A08 8 weeks gestation of pregnancy: Secondary | ICD-10-CM | POA: Diagnosis not present

## 2021-02-28 DIAGNOSIS — Z3A01 Less than 8 weeks gestation of pregnancy: Secondary | ICD-10-CM

## 2021-02-28 DIAGNOSIS — Z3687 Encounter for antenatal screening for uncertain dates: Secondary | ICD-10-CM | POA: Insufficient documentation

## 2021-02-28 DIAGNOSIS — O26841 Uterine size-date discrepancy, first trimester: Secondary | ICD-10-CM | POA: Diagnosis not present

## 2021-03-20 ENCOUNTER — Other Ambulatory Visit (HOSPITAL_COMMUNITY)
Admission: RE | Admit: 2021-03-20 | Discharge: 2021-03-20 | Disposition: A | Discharge status: Home / Self Care | Payer: BC Managed Care – PPO | Source: Ambulatory Visit | Attending: Family Medicine | Admitting: Family Medicine

## 2021-03-20 ENCOUNTER — Encounter: Payer: Self-pay | Admitting: Family Medicine

## 2021-03-20 ENCOUNTER — Ambulatory Visit (INDEPENDENT_AMBULATORY_CARE_PROVIDER_SITE_OTHER): Payer: BC Managed Care – PPO | Admitting: Family Medicine

## 2021-03-20 VITALS — BP 109/73 | HR 73 | Wt 282.8 lb

## 2021-03-20 DIAGNOSIS — O0991 Supervision of high risk pregnancy, unspecified, first trimester: Secondary | ICD-10-CM | POA: Diagnosis not present

## 2021-03-20 DIAGNOSIS — Z01419 Encounter for gynecological examination (general) (routine) without abnormal findings: Secondary | ICD-10-CM | POA: Diagnosis not present

## 2021-03-20 DIAGNOSIS — Z3A11 11 weeks gestation of pregnancy: Secondary | ICD-10-CM | POA: Diagnosis not present

## 2021-03-20 DIAGNOSIS — F411 Generalized anxiety disorder: Secondary | ICD-10-CM

## 2021-03-20 DIAGNOSIS — R7303 Prediabetes: Secondary | ICD-10-CM

## 2021-03-20 DIAGNOSIS — O99211 Obesity complicating pregnancy, first trimester: Secondary | ICD-10-CM

## 2021-03-20 DIAGNOSIS — Z98891 History of uterine scar from previous surgery: Secondary | ICD-10-CM | POA: Insufficient documentation

## 2021-03-20 DIAGNOSIS — O0990 Supervision of high risk pregnancy, unspecified, unspecified trimester: Secondary | ICD-10-CM | POA: Insufficient documentation

## 2021-03-20 DIAGNOSIS — J4520 Mild intermittent asthma, uncomplicated: Secondary | ICD-10-CM

## 2021-03-20 DIAGNOSIS — Z113 Encounter for screening for infections with a predominantly sexual mode of transmission: Secondary | ICD-10-CM | POA: Insufficient documentation

## 2021-03-20 DIAGNOSIS — E038 Other specified hypothyroidism: Secondary | ICD-10-CM

## 2021-03-20 LAB — HEPATITIS C ANTIBODY: HCV Ab: NEGATIVE

## 2021-03-20 MED ORDER — ONDANSETRON 4 MG PO TBDP: 4.0000 mg | ORAL_TABLET | Freq: Three times a day (TID) | ORAL | 2 refills | Status: DC | PRN

## 2021-03-20 MED ORDER — BLOOD PRESSURE KIT DEVI: 1.0000 | 0 refills | Status: DC

## 2021-03-20 NOTE — Addendum Note (Signed)
Addended by: Isabell Jarvis on: 03/20/2021 11:28 AM   Modules accepted: Orders

## 2021-03-20 NOTE — Progress Notes (Signed)
Subjective:   Veronica [name] is a 30 y.o. G2P1001 at [redacted]w[redacted]d by early ultrasound being seen today for her first obstetrical visit.  Her obstetrical history is significant for obesity and prior cesarean. Patient unsure about intend to breast feed. Pregnancy history fully reviewed.  Patient reports nausea.  HISTORY: OB History  Gravida Para Term Preterm AB Living  2 1 1  0 0 1  SAB IAB Ectopic Multiple Live Births  0 0 0 0 1    # Outcome Date GA Lbr Len/2nd Weight Sex Delivery Anes PTL Lv  2 Current           1 Term 09/02/10 [redacted]w[redacted]d  7 lb 12 oz (3.515 kg) M CS-LTranv Spinal  LIV   Last pap smear was  2019 and was normal Past Medical History:  Diagnosis Date  . Abdominal pain 01/06/2012  . Acute tension headache 07/27/2019  . Anemia   . Ankle pain   . Asthma   . Birth control counseling 08/26/2011  . BMI 40.0-44.9, adult (HCC) 02/27/2011  . GERD (gastroesophageal reflux disease)   . Hiatal hernia   . Obesity (BMI 30-39.9)   . Secondary amenorrhea 03/25/2018   Past Surgical History:  Procedure Laterality Date  . CESAREAN SECTION  09/02/2010   For macrosomia, but son was 7 lb 12 oz  . TONSILLECTOMY AND ADENOIDECTOMY     Family History  Problem Relation Age of Onset  . Diabetes Mother   . Hypertension Mother   . Obesity Mother   . Heart attack Mother 84       Death  . Obesity Sister   . Osteochondroma Sister   . Diabetes Sister   . Hypertension Sister    Social History   Tobacco Use  . Smoking status: Never Smoker  . Smokeless tobacco: Never Used  Vaping Use  . Vaping Use: Never used  Substance Use Topics  . Alcohol use: No  . Drug use: Not Currently    Comment: last used in teens   Allergies  Allergen Reactions  . Morphine And Related Other (See Comments)    Causes "chest to be heavy"  . Motrin [Ibuprofen]     Was told to not take this because it would flare stomach issues   Current Outpatient Medications on File Prior to Visit  Medication Sig Dispense  Refill  . albuterol (VENTOLIN HFA) 108 (90 Base) MCG/ACT inhaler INHALE 2 PUFFS BY MOUTH EVERY 4 HOURS AS NEEDED FOR WHEEZING OR SHORTNESS OF BREATH (COUGH). 18 g 0  . famotidine (PEPCID) 20 MG tablet Take 1 tablet (20 mg total) by mouth 2 (two) times daily. 60 tablet 1  . fluticasone (FLONASE) 50 MCG/ACT nasal spray Place 1 spray into both nostrils daily. 16 g 1  . pantoprazole (PROTONIX) 20 MG tablet Take 1 tablet (20 mg total) by mouth daily. 30 tablet 0  . Prenatal Vit-Fe Fumarate-FA (PRENATAL COMPLETE) 14-0.4 MG TABS Take 1 tablet by mouth daily. 60 tablet 3  . promethazine (PHENERGAN) 25 MG tablet Take 1 tablet (25 mg total) by mouth every 6 (six) hours as needed for nausea or vomiting. 30 tablet 2  . pyridOXINE (VITAMIN B-6) 50 MG tablet Take 1 tablet (50 mg total) by mouth at bedtime. 30 tablet 3  . busPIRone (BUSPAR) 10 MG tablet Take 1 tablet by mouth twice daily (Patient not taking: Reported on 03/20/2021) 180 tablet 0  . doxylamine, Sleep, (UNISOM) 25 MG tablet Take 1 tablet (25 mg total)  by mouth at bedtime as needed. (Patient not taking: Reported on 03/20/2021) 30 tablet 0  . sertraline (ZOLOFT) 100 MG tablet Take 1 tablet by mouth once daily (Patient not taking: Reported on 03/20/2021) 30 tablet 3   No current facility-administered medications on file prior to visit.     Exam   Vitals:   03/20/21 0937  BP: 109/73  Pulse: 73  Weight: 282 lb 12.8 oz (128.3 kg)      Uterus:     Pelvic Exam: Perineum: no hemorrhoids, normal perineum   Vulva: normal external genitalia, no lesions   Vagina:  normal mucosa, normal discharge   Cervix: no lesions and normal, pap smear done.    Adnexa: normal adnexa and no mass, fullness, tenderness   Bony Pelvis: average  System: General: well-developed, well-nourished female in no acute distress   Skin: normal coloration and turgor, no rashes   Neurologic: oriented, normal, negative, normal mood   Extremities: normal strength, tone, and muscle  mass, ROM of all joints is normal   HEENT PERRLA, extraocular movement intact and sclera clear, anicteric   Mouth/Teeth mucous membranes moist, pharynx normal without lesions and dental hygiene good   Neck supple and no masses   Respiratory:  no respiratory distress     Assessment:   Pregnancy: G2P1001 Patient Active Problem List   Diagnosis Date Noted  . Supervision of high risk pregnancy, antepartum 03/20/2021  . History of cesarean delivery 03/20/2021  . Asthma 08/31/2020  . Vaginal trichomoniasis 06/04/2020  . Palpitations 06/04/2020  . Back pain 02/13/2020  . Hiatal hernia 11/02/2019  . Dyspnea 11/02/2019  . Epigastric pain 09/13/2019  . Wheezing 08/27/2019  . Obesity complicating pregnancy 08/19/2019  . Subclinical hypothyroidism 05/19/2019  . Pre-diabetes 05/12/2019  . GAD (generalized anxiety disorder) 05/09/2019  . Costochondritis 01/25/2018  . GERD (gastroesophageal reflux disease) 03/28/2011  . Anemia 02/27/2011     Plan:  1. Supervision of high risk pregnancy, antepartum Initial labs drawn. Continue prenatal vitamins. Genetic Screening discussed, NIPS: ordered. Ultrasound discussed; fetal anatomic survey: ordered. Problem list reviewed and updated. The nature of [facility] with multiple MDs and other Advanced Practice Providers was explained to patient; also emphasized that residents, students are part of our team.  2. Obesity affecting pregnancy in first trimester BMI 51  3. Pre-diabetes Had A1c of 5.7% one year ago, subsequently normal, will recheck today  4. GAD (generalized anxiety disorder) Referred to [facility]  5. Subclinical hypothyroidism Check TSH, free T4  6. Mild intermittent asthma, unspecified whether complicated Stable   7. History of cesarean delivery Per review of op note OK to TOL[facility], pLTCS for suspected macrosomic infant, uncomplicated cesarean Desires [facility], discuss further at upcoming  visit   Routine obstetric precautions reviewed. Return in 4 weeks (on 04/17/2021) for [facility], ob visit.

## 2021-03-20 NOTE — Progress Notes (Signed)
Unable to get FHR on doppler, will use bedside US.

## 2021-03-20 NOTE — Patient Instructions (Signed)
 Obstetrics: Normal and Problem Pregnancies (7th ed., pp. 102-121). [place], [place]: [employer]."> Textbook of Family Medicine (9th ed., pp. 365-410). [place], [place]: [employer].">  First Trimester of Pregnancy  The first trimester of pregnancy starts on the first day of your last menstrual period until the end of week 12. This is months 1 through 3 of pregnancy. A week after a sperm fertilizes an egg, the egg will implant into the wall of the uterus and begin to develop into a baby. By the end of 12 weeks, all the baby's organs will be formed and the baby will be 2-3 inches in size. Body changes during your first trimester Your body goes through many changes during pregnancy. The changes vary and generally return to normal after your baby is born. Physical changes  You may gain or lose weight.  Your breasts may begin to grow larger and become tender. The tissue that surrounds your nipples (areola) may become darker.  Dark spots or blotches (chloasma or mask of pregnancy) may develop on your face.  You may have changes in your hair. These can include thickening or thinning of your hair or changes in texture. Health changes  You may feel nauseous, and you may vomit.  You may have heartburn.  You may develop headaches.  You may develop constipation.  Your gums may bleed and may be sensitive to brushing and flossing. Other changes  You may tire easily.  You may urinate more often.  Your menstrual periods will stop.  You may have a loss of appetite.  You may develop cravings for certain kinds of food.  You may have changes in your emotions from day to day.  You may have more vivid and strange dreams. Follow these instructions at home: Medicines  Follow your health care provider's instructions regarding medicine use. Specific medicines may be either safe or unsafe to take during pregnancy. Do not take any medicines unless told to by your health care provider.  Take  a prenatal vitamin that contains at least 600 micrograms (mcg) of folic acid. Eating and drinking  Eat a healthy diet that includes fresh fruits and vegetables, whole grains, good sources of protein such as meat, eggs, or tofu, and low-fat dairy products.  Avoid raw meat and unpasteurized juice, milk, and cheese. These carry germs that can harm you and your baby.  If you feel nauseous or you vomit: ? Eat 4 or 5 small meals a day instead of 3 large meals. ? Try eating a few soda crackers. ? Drink liquids between meals instead of during meals.  You may need to take these actions to prevent or treat constipation: ? Drink enough fluid to keep your urine pale yellow. ? Eat foods that are high in fiber, such as beans, whole grains, and fresh fruits and vegetables. ? Limit foods that are high in fat and processed sugars, such as fried or sweet foods. Activity  Exercise only as directed by your health care provider. Most people can continue their usual exercise routine during pregnancy. Try to exercise for 30 minutes at least 5 days a week.  Stop exercising if you develop pain or cramping in the lower abdomen or lower back.  Avoid exercising if it is very hot or humid or if you are at high altitude.  Avoid heavy lifting.  If you choose to, you may have sex unless your health care provider tells you not to. Relieving pain and discomfort  Wear a good support bra to relieve   breast tenderness.  Rest with your legs elevated if you have leg cramps or low back pain.  If you develop bulging veins (varicose veins) in your legs: ? Wear support hose as told by your health care provider. ? Elevate your feet for 15 minutes, 3-4 times a day. ? Limit salt in your diet. Safety  Wear your seat belt at all times when driving or riding in a car.  Talk with your health care provider if someone is verbally or physically abusive to you.  Talk with your health care provider if you are feeling sad or have  thoughts of hurting yourself. Lifestyle  Do not use hot tubs, steam rooms, or saunas.  Do not douche. Do not use tampons or scented sanitary pads.  Do not use herbal remedies, alcohol, illegal drugs, or medicines that are not approved by your health care provider. Chemicals in these products can harm your baby.  Do not use any products that contain nicotine or tobacco, such as cigarettes, e-cigarettes, and chewing tobacco. If you need help quitting, ask your health care provider.  Avoid cat litter boxes and soil used by cats. These carry germs that can cause birth defects in the baby and possibly loss of the unborn baby (fetus) by miscarriage or stillbirth. General instructions  During routine prenatal visits in the first trimester, your health care provider will do a physical exam, perform necessary tests, and ask you how things are going. Keep all follow-up visits. This is important.  Ask for help if you have counseling or nutritional needs during pregnancy. Your health care provider can offer advice or refer you to specialists for help with various needs.  Schedule a dentist appointment. At home, brush your teeth with a soft toothbrush. Floss gently.  Write down your questions. Take them to your prenatal visits. Where to find more information  American Pregnancy Association: americanpregnancy.org  American College of Obstetricians and Gynecologists: acog.org/en/Womens%20Health/Pregnancy  Office on Women's Health: womenshealth.gov/pregnancy Contact a health care provider if you have:  Dizziness.  A fever.  Mild pelvic cramps, pelvic pressure, or nagging pain in the abdominal area.  Nausea, vomiting, or diarrhea that lasts for 24 hours or longer.  A bad-smelling vaginal discharge.  Pain when you urinate.  Known exposure to a contagious illness, such as chickenpox, measles, Zika virus, HIV, or hepatitis. Get help right away if you have:  Spotting or bleeding from your  vagina.  Severe abdominal cramping or pain.  Shortness of breath or chest pain.  Any kind of trauma, such as from a fall or a car crash.  New or increased pain, swelling, or redness in an arm or leg. Summary  The first trimester of pregnancy starts on the first day of your last menstrual period until the end of week 12 (months 1 through 3).  Eating 4 or 5 small meals a day rather than 3 large meals may help to relieve nausea and vomiting.  Do not use any products that contain nicotine or tobacco, such as cigarettes, e-cigarettes, and chewing tobacco. If you need help quitting, ask your health care provider.  Keep all follow-up visits. This is important. This information is not intended to replace advice given to you by your health care provider. Make sure you discuss any questions you have with your health care provider. Document Revised: 05/02/2020 Document Reviewed: 03/08/2020 [employer] Patient Education  2021 [employer]   Contraception Choices Contraception, also called birth control, refers to methods or devices that prevent pregnancy. Hormonal   methods Contraceptive implant A contraceptive implant is a thin, plastic tube that contains a hormone that prevents pregnancy. It is different from an intrauterine device (IUD). It is inserted into the upper part of the arm by a health care provider. Implants can be effective for up to 3 years. Progestin-only injections Progestin-only injections are injections of progestin, a synthetic form of the hormone progesterone. They are given every 3 months by a health care provider. Birth control pills Birth control pills are pills that contain hormones that prevent pregnancy. They must be taken once a day, preferably at the same time each day. A prescription is needed to use this method of contraception. Birth control patch The birth control patch contains hormones that prevent pregnancy. It is placed on the skin and must be changed once a  week for three weeks and removed on the fourth week. A prescription is needed to use this method of contraception. Vaginal ring A vaginal ring contains hormones that prevent pregnancy. It is placed in the vagina for three weeks and removed on the fourth week. After that, the process is repeated with a new ring. A prescription is needed to use this method of contraception. Emergency contraceptive Emergency contraceptives prevent pregnancy after unprotected sex. They come in pill form and can be taken up to 5 days after sex. They work best the sooner they are taken after having sex. Most emergency contraceptives are available without a prescription. This method should not be used as your only form of birth control.   Barrier methods Female condom A female condom is a thin sheath that is worn over the penis during sex. Condoms keep sperm from going inside a woman's body. They can be used with a sperm-killing substance (spermicide) to increase their effectiveness. They should be thrown away after one use. Female condom A female condom is a soft, loose-fitting sheath that is put into the vagina before sex. The condom keeps sperm from going inside a woman's body. They should be thrown away after one use. Diaphragm A diaphragm is a soft, dome-shaped barrier. It is inserted into the vagina before sex, along with a spermicide. The diaphragm blocks sperm from entering the uterus, and the spermicide kills sperm. A diaphragm should be left in the vagina for 6-8 hours after sex and removed within 24 hours. A diaphragm is prescribed and fitted by a health care provider. A diaphragm should be replaced every 1-2 years, after giving birth, after gaining more than 15 lb (6.8 kg), and after pelvic surgery. Cervical cap A cervical cap is a round, soft latex or plastic cup that fits over the cervix. It is inserted into the vagina before sex, along with spermicide. It blocks sperm from entering the uterus. The cap should be  left in place for 6-8 hours after sex and removed within 48 hours. A cervical cap must be prescribed and fitted by a health care provider. It should be replaced every 2 years. Sponge A sponge is a soft, circular piece of polyurethane foam with spermicide in it. The sponge helps block sperm from entering the uterus, and the spermicide kills sperm. To use it, you make it wet and then insert it into the vagina. It should be inserted before sex, left in for at least 6 hours after sex, and removed and thrown away within 30 hours. Spermicides Spermicides are chemicals that kill or block sperm from entering the cervix and uterus. They can come as a cream, jelly, suppository, foam, or tablet. A spermicide   should be inserted into the vagina with an applicator at least 10-15 minutes before sex to allow time for it to work. The process must be repeated every time you have sex. Spermicides do not require a prescription.   Intrauterine contraception Intrauterine device (IUD) An IUD is a T-shaped device that is put in a woman's uterus. There are two types:  Hormone IUD.This type contains progestin, a synthetic form of the hormone progesterone. This type can stay in place for 3-5 years.  Copper IUD.This type is wrapped in copper wire. It can stay in place for 10 years. Permanent methods of contraception Female tubal ligation In this method, a woman's fallopian tubes are sealed, tied, or blocked during surgery to prevent eggs from traveling to the uterus. Hysteroscopic sterilization In this method, a small, flexible insert is placed into each fallopian tube. The inserts cause scar tissue to form in the fallopian tubes and block them, so sperm cannot reach an egg. The procedure takes about 3 months to be effective. Another form of birth control must be used during those 3 months. Female sterilization This is a procedure to tie off the tubes that carry sperm (vasectomy). After the procedure, the man can still  ejaculate fluid (semen). Another form of birth control must be used for 3 months after the procedure. Natural planning methods Natural family planning In this method, a couple does not have sex on days when the woman could become pregnant. Calendar method In this method, the woman keeps track of the length of each menstrual cycle, identifies the days when pregnancy can happen, and does not have sex on those days. Ovulation method In this method, a couple avoids sex during ovulation. Symptothermal method This method involves not having sex during ovulation. The woman typically checks for ovulation by watching changes in her temperature and in the consistency of cervical mucus. Post-ovulation method In this method, a couple waits to have sex until after ovulation. Where to find more information  Centers for Disease Control and Prevention: www.cdc.gov Summary  Contraception, also called birth control, refers to methods or devices that prevent pregnancy.  Hormonal methods of contraception include implants, injections, pills, patches, vaginal rings, and emergency contraceptives.  Barrier methods of contraception can include female condoms, female condoms, diaphragms, cervical caps, sponges, and spermicides.  There are two types of IUDs (intrauterine devices). An IUD can be put in a woman's uterus to prevent pregnancy for 3-5 years.  Permanent sterilization can be done through a procedure for males and females. Natural family planning methods involve nothaving sex on days when the woman could become pregnant. This information is not intended to replace advice given to you by your health care provider. Make sure you discuss any questions you have with your health care provider. Document Revised: 04/30/2020 Document Reviewed: 04/30/2020 Else[employer] Patient Education  2021 [employer]   Breastfeeding  Choosing to breastfeed is one of the best decisions you can make for yourself and your baby. A  change in hormones during pregnancy causes your breasts to make breast milk in your milk-producing glands. Hormones prevent breast milk from being released before your baby is born. They also prompt milk flow after birth. Once breastfeeding has begun, thoughts of your baby, as well as his or her sucking or crying, can stimulate the release of milk from your milk-producing glands. Benefits of breastfeeding Research shows that breastfeeding offers many health benefits for infants and mothers. It also offers a cost-free and convenient way to feed your baby.   For your baby  Your first milk (colostrum) helps your baby's digestive system to function better.  Special cells in your milk (antibodies) help your baby to fight off infections.  Breastfed babies are less likely to develop asthma, allergies, obesity, or type 2 diabetes. They are also at lower risk for sudden infant death syndrome (SIDS).  Nutrients in breast milk are better able to meet your baby's needs compared to infant formula.  Breast milk improves your baby's brain development. For you  Breastfeeding helps to create a very special bond between you and your baby.  Breastfeeding is convenient. Breast milk costs nothing and is always available at the correct temperature.  Breastfeeding helps to burn calories. It helps you to lose the weight that you gained during pregnancy.  Breastfeeding makes your uterus return faster to its size before pregnancy. It also slows bleeding (lochia) after you give birth.  Breastfeeding helps to lower your risk of developing type 2 diabetes, osteoporosis, rheumatoid arthritis, cardiovascular disease, and breast, ovarian, uterine, and endometrial cancer later in life. Breastfeeding basics Starting breastfeeding  Find a comfortable place to sit or lie down, with your neck and back well-supported.  Place a pillow or a rolled-up blanket under your baby to bring him or her to the level of your breast (if  you are seated). Nursing pillows are specially designed to help support your arms and your baby while you breastfeed.  Make sure that your baby's tummy (abdomen) is facing your abdomen.  Gently massage your breast. With your fingertips, massage from the outer edges of your breast inward toward the nipple. This encourages milk flow. If your milk flows slowly, you may need to continue this action during the feeding.  Support your breast with 4 fingers underneath and your thumb above your nipple (make the letter "C" with your hand). Make sure your fingers are well away from your nipple and your baby's mouth.  Stroke your baby's lips gently with your finger or nipple.  When your baby's mouth is open wide enough, quickly bring your baby to your breast, placing your entire nipple and as much of the areola as possible into your baby's mouth. The areola is the colored area around your nipple. ? More areola should be visible above your baby's upper lip than below the lower lip. ? Your baby's lips should be opened and extended outward (flanged) to ensure an adequate, comfortable latch. ? Your baby's tongue should be between his or her lower gum and your breast.  Make sure that your baby's mouth is correctly positioned around your nipple (latched). Your baby's lips should create a seal on your breast and be turned out (everted).  It is common for your baby to suck about 2-3 minutes in order to start the flow of breast milk. Latching Teaching your baby how to latch onto your breast properly is very important. An improper latch can cause nipple pain, decreased milk supply, and poor weight gain in your baby. Also, if your baby is not latched onto your nipple properly, he or she may swallow some air during feeding. This can make your baby fussy. Burping your baby when you switch breasts during the feeding can help to get rid of the air. However, teaching your baby to latch on properly is still the best way to  prevent fussiness from swallowing air while breastfeeding. Signs that your baby has successfully latched onto your nipple  Silent tugging or silent sucking, without causing you pain. Infant's lips should be   extended outward (flanged).  Swallowing heard between every 3-4 sucks once your milk has started to flow (after your let-down milk reflex occurs).  Muscle movement above and in front of his or her ears while sucking. Signs that your baby has not successfully latched onto your nipple  Sucking sounds or smacking sounds from your baby while breastfeeding.  Nipple pain. If you think your baby has not latched on correctly, slip your finger into the corner of your baby's mouth to break the suction and place it between your baby's gums. Attempt to start breastfeeding again. Signs of successful breastfeeding Signs from your baby  Your baby will gradually decrease the number of sucks or will completely stop sucking.  Your baby will fall asleep.  Your baby's body will relax.  Your baby will retain a small amount of milk in his or her mouth.  Your baby will let go of your breast by himself or herself. Signs from you  Breasts that have increased in firmness, weight, and size 1-3 hours after feeding.  Breasts that are softer immediately after breastfeeding.  Increased milk volume, as well as a change in milk consistency and color by the fifth day of breastfeeding.  Nipples that are not sore, cracked, or bleeding. Signs that your baby is getting enough milk  Wetting at least 1-2 diapers during the first 24 hours after birth.  Wetting at least 5-6 diapers every 24 hours for the first week after birth. The urine should be clear or pale yellow by the age of 5 days.  Wetting 6-8 diapers every 24 hours as your baby continues to grow and develop.  At least 3 stools in a 24-hour period by the age of 5 days. The stool should be soft and yellow.  At least 3 stools in a 24-hour period by the  age of 7 days. The stool should be seedy and yellow.  No loss of weight greater than 10% of birth weight during the first 3 days of life.  Average weight gain of 4-7 oz (113-198 g) per week after the age of 4 days.  Consistent daily weight gain by the age of 5 days, without weight loss after the age of 2 weeks. After a feeding, your baby may spit up a small amount of milk. This is normal. Breastfeeding frequency and duration Frequent feeding will help you make more milk and can prevent sore nipples and extremely full breasts (breast engorgement). Breastfeed when you feel the need to reduce the fullness of your breasts or when your baby shows signs of hunger. This is called "breastfeeding on demand." Signs that your baby is hungry include:  Increased alertness, activity, or restlessness.  Movement of the head from side to side.  Opening of the mouth when the corner of the mouth or cheek is stroked (rooting).  Increased sucking sounds, smacking lips, cooing, sighing, or squeaking.  Hand-to-mouth movements and sucking on fingers or hands.  Fussing or crying. Avoid introducing a pacifier to your baby in the first 4-6 weeks after your baby is born. After this time, you may choose to use a pacifier. Research has shown that pacifier use during the first year of a baby's life decreases the risk of sudden infant death syndrome (SIDS). Allow your baby to feed on each breast as long as he or she wants. When your baby unlatches or falls asleep while feeding from the first breast, offer the second breast. Because newborns are often sleepy in the first few weeks of   life, you may need to awaken your baby to get him or her to feed. Breastfeeding times will vary from baby to baby. However, the following rules can serve as a guide to help you make sure that your baby is properly fed:  Newborns (babies 4 weeks of age or younger) may breastfeed every 1-3 hours.  Newborns should not go without breastfeeding  for longer than 3 hours during the day or 5 hours during the night.  You should breastfeed your baby a minimum of 8 times in a 24-hour period. Breast milk pumping Pumping and storing breast milk allows you to make sure that your baby is exclusively fed your breast milk, even at times when you are unable to breastfeed. This is especially important if you go back to work while you are still breastfeeding, or if you are not able to be present during feedings. Your lactation consultant can help you find a method of pumping that works best for you and give you guidelines about how long it is safe to store breast milk.      Caring for your breasts while you breastfeed Nipples can become dry, cracked, and sore while breastfeeding. The following recommendations can help keep your breasts moisturized and healthy:  Avoid using soap on your nipples.  Wear a supportive bra designed especially for nursing. Avoid wearing underwire-style bras or extremely tight bras (sports bras).  Air-dry your nipples for 3-4 minutes after each feeding.  Use only cotton bra pads to absorb leaked breast milk. Leaking of breast milk between feedings is normal.  Use lanolin on your nipples after breastfeeding. Lanolin helps to maintain your skin's normal moisture barrier. Pure lanolin is not harmful (not toxic) to your baby. You may also hand express a few drops of breast milk and gently massage that milk into your nipples and allow the milk to air-dry. In the first few weeks after giving birth, some women experience breast engorgement. Engorgement can make your breasts feel heavy, warm, and tender to the touch. Engorgement peaks within 3-5 days after you give birth. The following recommendations can help to ease engorgement:  Completely empty your breasts while breastfeeding or pumping. You may want to start by applying warm, moist heat (in the shower or with warm, water-soaked hand towels) just before feeding or pumping. This  increases circulation and helps the milk flow. If your baby does not completely empty your breasts while breastfeeding, pump any extra milk after he or she is finished.  Apply ice packs to your breasts immediately after breastfeeding or pumping, unless this is too uncomfortable for you. To do this: ? Put ice in a plastic bag. ? Place a towel between your skin and the bag. ? Leave the ice on for 20 minutes, 2-3 times a day.  Make sure that your baby is latched on and positioned properly while breastfeeding. If engorgement persists after 48 hours of following these recommendations, contact your health care provider or a lactation consultant. Overall health care recommendations while breastfeeding  Eat 3 healthy meals and 3 snacks every day. Well-nourished mothers who are breastfeeding need an additional 450-500 calories a day. You can meet this requirement by increasing the amount of a balanced diet that you eat.  Drink enough water to keep your urine pale yellow or clear.  Rest often, relax, and continue to take your prenatal vitamins to prevent fatigue, stress, and low vitamin and mineral levels in your body (nutrient deficiencies).  Do not use any products that contain   nicotine or tobacco, such as cigarettes and e-cigarettes. Your baby may be harmed by chemicals from cigarettes that pass into breast milk and exposure to secondhand smoke. If you need help quitting, ask your health care provider.  Avoid alcohol.  Do not use illegal drugs or marijuana.  Talk with your health care provider before taking any medicines. These include over-the-counter and prescription medicines as well as vitamins and herbal supplements. Some medicines that may be harmful to your baby can pass through breast milk.  It is possible to become pregnant while breastfeeding. If birth control is desired, ask your health care provider about options that will be safe while breastfeeding your baby. Where to find more  information: [employer]: www.llli.org Contact a health care provider if:  You feel like you want to stop breastfeeding or have become frustrated with breastfeeding.  Your nipples are cracked or bleeding.  Your breasts are red, tender, or warm.  You have: ? Painful breasts or nipples. ? A swollen area on either breast. ? A fever or chills. ? Nausea or vomiting. ? Drainage other than breast milk from your nipples.  Your breasts do not become full before feedings by the fifth day after you give birth.  You feel sad and depressed.  Your baby is: ? Too sleepy to eat well. ? Having trouble sleeping. ? More than 1 week old and wetting fewer than 6 diapers in a 24-hour period. ? Not gaining weight by 5 days of age.  Your baby has fewer than 3 stools in a 24-hour period.  Your baby's skin or the white parts of his or her eyes become yellow. Get help right away if:  Your baby is overly tired (lethargic) and does not want to wake up and feed.  Your baby develops an unexplained fever. Summary  Breastfeeding offers many health benefits for infant and mothers.  Try to breastfeed your infant when he or she shows early signs of hunger.  Gently tickle or stroke your baby's lips with your finger or nipple to allow the baby to open his or her mouth. Bring the baby to your breast. Make sure that much of the areola is in your baby's mouth. Offer one side and burp the baby before you offer the other side.  Talk with your health care provider or lactation consultant if you have questions or you face problems as you breastfeed. This information is not intended to replace advice given to you by your health care provider. Make sure you discuss any questions you have with your health care provider. Document Revised: 02/18/2018 Document Reviewed: 12/26/2016 Elsevier Patient Education  2021 Elsevier Inc.  

## 2021-03-21 LAB — T4, FREE: Free T4: 1.13 ng/dL (ref 0.82–1.77)

## 2021-03-21 LAB — TSH: TSH: 3.87 u[IU]/mL (ref 0.450–4.500)

## 2021-03-21 LAB — HEMOGLOBIN A1C
Est. average glucose Bld gHb Est-mCnc: 108 mg/dL
Hgb A1c MFr Bld: 5.4 % (ref 4.8–5.6)

## 2021-03-21 LAB — HEPATITIS C ANTIBODY: Hep C Virus Ab: <0.1 s/co ratio (ref 0.0–0.9)

## 2021-03-22 LAB — CYTOLOGY - PAP
Chlamydia: NEGATIVE
Comment: NEGATIVE
Comment: NEGATIVE
Comment: NEGATIVE
Comment: NORMAL
Diagnosis: NEGATIVE
High risk HPV: NEGATIVE
Neisseria Gonorrhea: NEGATIVE
Trichomonas: NEGATIVE

## 2021-03-24 ENCOUNTER — Other Ambulatory Visit: Payer: Self-pay

## 2021-03-24 ENCOUNTER — Encounter (HOSPITAL_COMMUNITY): Payer: Self-pay | Admitting: Obstetrics & Gynecology

## 2021-03-24 ENCOUNTER — Inpatient Hospital Stay (HOSPITAL_COMMUNITY)
Admission: AD | Admit: 2021-03-24 | Discharge: 2021-03-24 | Disposition: A | Discharge status: Home / Self Care | Payer: BC Managed Care – PPO | Attending: Obstetrics & Gynecology | Admitting: Obstetrics & Gynecology

## 2021-03-24 DIAGNOSIS — O26891 Other specified pregnancy related conditions, first trimester: Secondary | ICD-10-CM

## 2021-03-24 DIAGNOSIS — Z79899 Other long term (current) drug therapy: Secondary | ICD-10-CM | POA: Diagnosis not present

## 2021-03-24 DIAGNOSIS — O219 Vomiting of pregnancy, unspecified: Secondary | ICD-10-CM

## 2021-03-24 DIAGNOSIS — R519 Headache, unspecified: Secondary | ICD-10-CM | POA: Diagnosis not present

## 2021-03-24 DIAGNOSIS — Z3A12 12 weeks gestation of pregnancy: Secondary | ICD-10-CM | POA: Insufficient documentation

## 2021-03-24 LAB — CBC
HCT: 33.1 % — ABNORMAL LOW (ref 36.0–46.0)
Hemoglobin: 11.1 g/dL — ABNORMAL LOW (ref 12.0–15.0)
MCH: 27.8 pg (ref 26.0–34.0)
MCHC: 33.5 g/dL (ref 30.0–36.0)
MCV: 83 fL (ref 80.0–100.0)
Platelets: 327 10*3/uL (ref 150–400)
RBC: 3.99 MIL/uL (ref 3.87–5.11)
RDW: 14.2 % (ref 11.5–15.5)
WBC: 9.6 10*3/uL (ref 4.0–10.5)
nRBC: 0 % (ref 0.0–0.2)

## 2021-03-24 LAB — COMPREHENSIVE METABOLIC PANEL WITH GFR
ALT: 19 U/L (ref 0–44)
AST: 11 U/L — ABNORMAL LOW (ref 15–41)
Albumin: 2.9 g/dL — ABNORMAL LOW (ref 3.5–5.0)
Alkaline Phosphatase: 57 U/L (ref 38–126)
Anion gap: 8 (ref 5–15)
BUN: 12 mg/dL (ref 6–20)
CO2: 24 mmol/L (ref 22–32)
Calcium: 9.1 mg/dL (ref 8.9–10.3)
Chloride: 101 mmol/L (ref 98–111)
Creatinine, Ser: 0.84 mg/dL (ref 0.44–1.00)
GFR, Estimated: >60 mL/min (ref >60)
Glucose, Bld: 73 mg/dL (ref 70–99)
Potassium: 4 mmol/L (ref 3.5–5.1)
Sodium: 133 mmol/L — ABNORMAL LOW (ref 135–145)
Total Bilirubin: 0.2 mg/dL — ABNORMAL LOW (ref 0.3–1.2)
Total Protein: 6.8 g/dL (ref 6.5–8.1)

## 2021-03-24 LAB — URINALYSIS, ROUTINE W REFLEX MICROSCOPIC
Bilirubin Urine: NEGATIVE
Glucose, UA: NEGATIVE mg/dL
Hgb urine dipstick: NEGATIVE
Ketones, ur: NEGATIVE mg/dL
Leukocytes,Ua: NEGATIVE
Nitrite: NEGATIVE
Protein, ur: NEGATIVE mg/dL
Specific Gravity, Urine: 1.021 (ref 1.005–1.030)
pH: 6 (ref 5.0–8.0)

## 2021-03-24 MED ORDER — ACETAMINOPHEN 500 MG PO TABS
1000.0000 mg | ORAL_TABLET | Freq: Once | ORAL | Status: AC
  Administered 2021-03-24: 1000 mg via ORAL
  Filled 2021-03-24: qty 2

## 2021-03-24 MED ORDER — LACTATED RINGERS IV BOLUS
1000.0000 mL | Freq: Once | INTRAVENOUS | Status: AC
  Administered 2021-03-24: 1000 mL via INTRAVENOUS

## 2021-03-24 MED ORDER — ONDANSETRON HCL 4 MG/2ML IJ SOLN
4.0000 mg | Freq: Once | INTRAMUSCULAR | Status: AC
  Administered 2021-03-24: 4 mg via INTRAVENOUS
  Filled 2021-03-24: qty 2

## 2021-03-24 MED ORDER — ONDANSETRON 4 MG PO TBDP: 4.0000 mg | ORAL_TABLET | Freq: Three times a day (TID) | ORAL | 0 refills | Status: DC | PRN

## 2021-03-24 NOTE — MAU Note (Signed)
Pt reports to mau with c/o headache, nausea, and blurry vision since yesterday.  Pt denies taking any pain meds for headache. Denies bleeding or abd pain

## 2021-03-24 NOTE — MAU Provider Note (Signed)
History     CSN: [id number]  Arrival date and time: 03/24/21 0750   Event Date/Time   First Provider Initiated Contact with Patient 03/24/21 0830      Chief Complaint  Patient presents with  . Emesis  . Headache  . Blurred Vision   Veronica [name] is a 30 y.o. G2P1001 at 57w1dwho presents today with nausea, headache and blurry vision. She reports that she has had nausea and headaches since the beginning of the pregnancy. She reports that the blurry vision started yesterday. She reports that the nausea and headches are getting much worse, but the blurry vision is getting better. She has not taken anything for these symptoms.   Emesis  This is a new problem. The current episode started more than 1 month ago. The problem occurs 2 to 4 times per day. The problem has been gradually worsening. The emesis has an appearance of stomach contents. There has been no fever. Associated symptoms include headaches. Pertinent negatives include no chills or fever. Risk factors: pregnancy  Treatments tried: diclegis  The treatment provided no relief (reports the dicelgis is making the nausea worse and making her tired. ).  Headache  This is a new problem. The current episode started 1 to 4 weeks ago. The problem occurs intermittently. The problem has been gradually worsening. The pain is located in the frontal region. The pain is at a severity of 5/10. Associated symptoms include nausea and vomiting. Pertinent negatives include no fever. Nothing aggravates the symptoms. She has tried nothing for the symptoms.    OB History    Gravida  2   Para  1   Term  1   Preterm      AB      Living  1     SAB      IAB      Ectopic      Multiple      Live Births  1           Past Medical History:  Diagnosis Date  . Abdominal pain 01/06/2012  . Acute tension headache 07/27/2019  . Anemia   . Ankle pain   . Asthma   . Birth control counseling 08/26/2011  . [place] 40.0-44.9[place]  . GERD (gastroesophageal reflux disease)   . Hiatal hernia   . Obesity (BMI 30-39.9)   . Secondary amenorrhea 03/25/2018    Past Surgical History:  Procedure Laterality Date  . CESAREAN SECTION  09/02/2010   For macrosomia, but son was 7 lb 12 oz  . TONSILLECTOMY AND ADENOIDECTOMY      Family History  Problem Relation Age of Onset  . Diabetes Mother   . Hypertension Mother   . Obesity Mother   . Heart attack Mother 415      Death  . Obesity Sister   . Osteochondroma Sister   . Diabetes Sister   . Hypertension Sister     Social History   Tobacco Use  . Smoking status: Never Smoker  . Smokeless tobacco: Never Used  Vaping Use  . Vaping Use: Never used  Substance Use Topics  . Alcohol use: No  . Drug use: Not Currently    Comment: last used in teens    Allergies:  Allergies  Allergen Reactions  . Morphine And Related Other (See Comments)    Causes "chest to be heavy"  . Motrin [Ibuprofen]     Was told to not take this because  it would flare stomach issues    Medications Prior to Admission  Medication Sig Dispense Refill Last Dose  . albuterol (VENTOLIN HFA) 108 (90 Base) MCG/ACT inhaler INHALE 2 PUFFS BY MOUTH EVERY 4 HOURS AS NEEDED FOR WHEEZING OR SHORTNESS OF BREATH (COUGH). 18 g 0 Past Month at Unknown time  . famotidine (PEPCID) 20 MG tablet Take 1 tablet (20 mg total) by mouth 2 (two) times daily. 60 tablet 1 Past Week at Unknown time  . fluticasone (FLONASE) 50 MCG/ACT nasal spray Place 1 spray into both nostrils daily. 16 g 1 Past Week at Unknown time  . pantoprazole (PROTONIX) 20 MG tablet Take 1 tablet (20 mg total) by mouth daily. 30 tablet 0 Past Week at Unknown time  . Prenatal Vit-Fe Fumarate-FA (PRENATAL COMPLETE) 14-0.4 MG TABS Take 1 tablet by mouth daily. 60 tablet 3 03/24/2021 at 0700  . pyridOXINE (VITAMIN B-6) 50 MG tablet Take 1 tablet (50 mg total) by mouth at bedtime. 30 tablet 3 Past Month at Unknown time  . Blood Pressure  Monitoring (BLOOD PRESSURE KIT) DEVI 1 Device by Does not apply route once a week. 1 each 0   . busPIRone (BUSPAR) 10 MG tablet Take 1 tablet by mouth twice daily (Patient not taking: Reported on 03/20/2021) 180 tablet 0   . doxylamine, Sleep, (UNISOM) 25 MG tablet Take 1 tablet (25 mg total) by mouth at bedtime as needed. (Patient not taking: Reported on 03/20/2021) 30 tablet 0   . ondansetron (ZOFRAN ODT) 4 MG disintegrating tablet Take 1 tablet (4 mg total) by mouth every 8 (eight) hours as needed for nausea or vomiting. 30 tablet 2   . sertraline (ZOLOFT) 100 MG tablet Take 1 tablet by mouth once daily (Patient not taking: Reported on 03/20/2021) 30 tablet 3     Review of Systems  Constitutional: Negative for chills and fever.  Gastrointestinal: Positive for nausea and vomiting.  Genitourinary: Negative for dysuria, pelvic pain, vaginal bleeding and vaginal discharge.  Neurological: Positive for headaches.   Physical Exam   Blood pressure 119/69, pulse 89, temperature 97.7 F (36.5 C), temperature source Oral, resp. rate 17, last menstrual period 12/29/2020, SpO2 99 %.  Physical Exam Vitals and nursing note reviewed. Exam conducted with a chaperone present.  Constitutional:      General: She is not in acute distress. HENT:     Head: Normocephalic.  Eyes:     Pupils: Pupils are equal, round, and reactive to light.  Cardiovascular:     Rate and Rhythm: Normal rate.  Pulmonary:     Effort: Pulmonary effort is normal.  Abdominal:     Palpations: Abdomen is soft.     Tenderness: There is no abdominal tenderness.  Skin:    General: Skin is warm and dry.  Neurological:     Mental Status: She is alert and oriented to person, place, and time.  Psychiatric:        Mood and Affect: Mood normal.        Behavior: Behavior normal.      Pt informed that the ultrasound is considered a limited OB ultrasound and is not intended to be a complete ultrasound exam.  Patient also informed that  the ultrasound is not being completed with the intent of assessing for fetal or placental anomalies or any pelvic abnormalities.  Explained that the purpose of today's ultrasound is to assess for  viability.  Patient acknowledges the purpose of the exam and the limitations of the study.    +  FCA 150bpm   Results for orders placed or performed during the hospital encounter of 03/24/21 (from the past 24 hour(s))  CBC     Status: Abnormal   Collection Time: 03/24/21  8:57 AM  Result Value Ref Range   WBC 9.6 4.0 - 10.5 K/uL   RBC 3.99 3.87 - 5.11 MIL/uL   Hemoglobin 11.1 (L) 12.0 - 15.0 g/dL   HCT 33.1 (L) 36.0 - 46.0 %   MCV 83.0 80.0 - 100.0 fL   MCH 27.8 26.0 - 34.0 pg   MCHC 33.5 30.0 - 36.0 g/dL   RDW 14.2 11.5 - 15.5 %   Platelets 327 150 - 400 K/uL   nRBC 0.0 0.0 - 0.2 %  Comprehensive metabolic panel     Status: Abnormal   Collection Time: 03/24/21  8:57 AM  Result Value Ref Range   Sodium 133 (L) 135 - 145 mmol/L   Potassium 4.0 3.5 - 5.1 mmol/L   Chloride 101 98 - 111 mmol/L   CO2 24 22 - 32 mmol/L   Glucose, Bld 73 70 - 99 mg/dL   BUN 12 6 - 20 mg/dL   Creatinine, Ser 0.84 0.44 - 1.00 mg/dL   Calcium 9.1 8.9 - 10.3 mg/dL   Total Protein 6.8 6.5 - 8.1 g/dL   Albumin 2.9 (L) 3.5 - 5.0 g/dL   AST 11 (L) 15 - 41 U/L   ALT 19 0 - 44 U/L   Alkaline Phosphatase 57 38 - 126 U/L   Total Bilirubin 0.2 (L) 0.3 - 1.2 mg/dL   GFR, Estimated >60 >60 mL/min   Anion gap 8 5 - 15     MAU Course  Procedures  MDM Patient has had 1L or LR, 1000 mg tylenol, 95m zofran. She reports that she is feeling better and headache has improved. Will PO challenge and then DC if tolerating PO.   Patient tolerating PO. Will DC home with zofran PRN   Assessment and Plan   1. Nausea/vomiting in pregnancy   2. Pregnancy headache in second trimester   3. [redacted] weeks gestation of pregnancy    DC home 2nd Trimester precautions  RX: zofran ODT PRN #[id number]  Return to MAU as needed FU with OB as  planned   [place]   Specialty: Obstetrics and Gynecology Contact information: [phone]NP, CNM  03/24/21  10:35 AM

## 2021-03-25 ENCOUNTER — Encounter: Payer: Self-pay | Admitting: *Deleted

## 2021-03-26 ENCOUNTER — Encounter: Payer: Self-pay | Admitting: General Practice

## 2021-04-01 NOTE — BH Specialist Note (Deleted)
Integrated Behavioral Health via Telemedicine Visit  04/01/2021 Brizza Nathanson Lore [id number]  Number of Integrated Behavioral Health visits: 1 Session Start time: 8:15***  Session End time: 9:15*** Total time: {IBH Total Time:21014050}  Referring Provider: *** Patient/Family location: Home*** [facility] Provider location: [facility]  All persons participating in visit: Patient *** and [facility] Marbella Markgraf ***  Types of Service: {CHL AMB TYPE OF SERVICE:[phone]}  I connected with Merlene Laughter Ige and/or Cala Bradford R Kiedrowski's {family members:20773} via  Telephone or Engineer, civil (consulting)  (Video is Caregility application) and verified that I am speaking with the correct person using two identifiers. Discussed confidentiality: {YES/NO:21197}  I discussed the limitations of telemedicine and the availability of in person appointments.  Discussed there is a possibility of technology failure and discussed alternative modes of communication if that failure occurs.  I discussed that engaging in this telemedicine visit, they consent to the provision of behavioral healthcare and the services will be billed under their insurance.  Patient and/or legal guardian expressed understanding and consented to Telemedicine visit: {YES/NO:21197}  Presenting Concerns: Patient and/or family reports the following symptoms/concerns: *** Duration of problem: ***; Severity of problem: {Mild/Moderate/Severe:20260}  Patient and/or Family's Strengths/Protective Factors: {CHL AMB BH PROTECTIVE [phone]}  Goals Addressed: Patient will: 1.  Reduce symptoms of: {IBH Symptoms:21014056}  2.  Increase knowledge and/or ability of: {IBH Patient Tools:21014057}  3.  Demonstrate ability to: {IBH Goals:21014053}  Progress towards Goals: {CHL AMB BH PROGRESS TOWARDS GOALS:[phone]}  Interventions: Interventions utilized:  {IBH  Interventions:21014054} Standardized Assessments completed: {IBH Screening Tools:21014051}  Patient and/or Family Response: ***  Assessment: Patient currently experiencing ***.   Patient may benefit from ***.  Plan: 1. Follow up with behavioral health clinician on : *** 2. Behavioral recommendations: *** 3. Referral(s): {IBH Referrals:21014055}  I discussed the assessment and treatment plan with the patient and/or parent/guardian. They were provided an opportunity to ask questions and all were answered. They agreed with the plan and demonstrated an understanding of the instructions.   They were advised to call back or seek an in-person evaluation if the symptoms worsen or if the condition fails to improve as anticipated.  [name], LCSW   Depression screen [facility] 2/9 03/20/2021 02/20/2021 11/26/2020 10/29/2020 06/04/2020  Decreased Interest 1 1 1  0 1  Down, Depressed, Hopeless 0 1 1 1 1   PHQ - 2 Score 1 2 2 1 2   Altered sleeping 1 2 1 1  -  Tired, decreased energy 1 2 2 1  -  Change in appetite 0 1 2 1  -  Feeling bad or failure about yourself  0 0 0 0 -  Trouble concentrating 0 0 0 1 -  Moving slowly or fidgety/restless 0 1 0 0 -  Suicidal thoughts 0 0 0 0 -  PHQ-9 Score 3 8 7 5  -  Difficult doing work/chores - Somewhat difficult - - -  Some recent data might be hidden   GAD 7 : Generalized Anxiety Score 03/20/2021 02/10/2020 07/05/2019  Nervous, Anxious, on Edge 1 1 2   Control/stop worrying 1 1 3   Worry too much - different things 1 1 2   Trouble relaxing 1 2 1   Restless 1 1 2   Easily annoyed or irritable 2 2 3   Afraid - awful might happen 0 2 3  Total GAD 7 Score 7 10 16   Anxiety Difficulty - Not difficult at all Not difficult at all

## 2021-04-11 ENCOUNTER — Inpatient Hospital Stay (HOSPITAL_COMMUNITY)
Admission: AD | Admit: 2021-04-11 | Discharge: 2021-04-12 | Disposition: A | Discharge status: Home / Self Care | Payer: BC Managed Care – PPO | Attending: Obstetrics and Gynecology | Admitting: Obstetrics and Gynecology

## 2021-04-11 ENCOUNTER — Other Ambulatory Visit: Payer: Self-pay

## 2021-04-11 DIAGNOSIS — Z885 Allergy status to narcotic agent status: Secondary | ICD-10-CM | POA: Insufficient documentation

## 2021-04-11 DIAGNOSIS — R55 Syncope and collapse: Secondary | ICD-10-CM | POA: Diagnosis not present

## 2021-04-11 DIAGNOSIS — Z886 Allergy status to analgesic agent status: Secondary | ICD-10-CM | POA: Diagnosis not present

## 2021-04-11 DIAGNOSIS — O0990 Supervision of high risk pregnancy, unspecified, unspecified trimester: Secondary | ICD-10-CM

## 2021-04-11 DIAGNOSIS — H538 Other visual disturbances: Secondary | ICD-10-CM | POA: Diagnosis not present

## 2021-04-11 DIAGNOSIS — O09292 Supervision of pregnancy with other poor reproductive or obstetric history, second trimester: Secondary | ICD-10-CM | POA: Diagnosis not present

## 2021-04-11 DIAGNOSIS — Z79899 Other long term (current) drug therapy: Secondary | ICD-10-CM | POA: Insufficient documentation

## 2021-04-11 DIAGNOSIS — Z3A14 14 weeks gestation of pregnancy: Secondary | ICD-10-CM | POA: Insufficient documentation

## 2021-04-11 DIAGNOSIS — O99891 Other specified diseases and conditions complicating pregnancy: Secondary | ICD-10-CM | POA: Diagnosis not present

## 2021-04-11 DIAGNOSIS — O99012 Anemia complicating pregnancy, second trimester: Secondary | ICD-10-CM | POA: Diagnosis not present

## 2021-04-11 DIAGNOSIS — D649 Anemia, unspecified: Secondary | ICD-10-CM | POA: Diagnosis not present

## 2021-04-11 DIAGNOSIS — R42 Dizziness and giddiness: Secondary | ICD-10-CM

## 2021-04-11 NOTE — MAU Note (Signed)
Wynelle Bourgeois CNM in Triage to see pt.

## 2021-04-11 NOTE — MAU Note (Addendum)
For the past couple of days I have had blurry vision and felt faint. 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

## 2021-04-11 NOTE — MAU Provider Note (Signed)
Chief Complaint:  Blurred Vision   Event Date/Time   First Provider Initiated Contact with Patient 04/11/21 2256     HPI: Veronica [name] is a 30 y.o. G2P1001 at 18w5dho presents to maternity admissions reporting blurry vision off and on and episodes of feeling faint.  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 States is made to work alone and is worried she will faint and no one will be there. . She denies LOF, vaginal bleeding, vaginal itching/burning, urinary symptoms, h/a, n/v, diarrhea, constipation or fever/chills.  Other This is a new (blurry vision and feeling faint) problem. The current episode started in the past 7 days. The problem occurs intermittently. Associated symptoms include a visual change. Pertinent negatives include no abdominal pain, chills, fever, headaches, myalgias, nausea, urinary symptoms, vertigo or vomiting. Nothing aggravates the symptoms. She has tried nothing for the symptoms.    RN Note: For the past couple of days I have had blurry vision and felt faint. 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 Abdominal pain 01/06/2012  . Acute tension headache 07/27/2019  . Anemia   . Ankle pain   . Asthma   . Birth control counseling 08/26/2011  . BMI 40.0-44.9[place]/27[place]  . GERD (gastroesophageal reflux disease)   . Hiatal hernia   . Obesity (BMI 30-39.9)   . Secondary amenorrhea 03/25/2018    Past obstetric history: OB History  Gravida Para Term Preterm AB Living  2 1 1     1   SAB IAB Ectopic Multiple Live Births          1    # Outcome Date GA Lbr Len/2nd Weight Sex Delivery Anes PTL Lv  2 Current           1 Term 09/02/10 [id number] g M CS-LTranv Spinal  LIV    Past Surgical History: Past Surgical History:  Procedure Laterality Date  . CESAREAN SECTION  09/02/2010   For macrosomia, but son was 7 lb 12 oz  .  TONSILLECTOMY AND ADENOIDECTOMY      Family History: Family History  Problem Relation Age of Onset  . Diabetes Mother   . Hypertension Mother   . Obesity Mother   . Heart attack Mother 4465     Death  . Obesity Sister   . Osteochondroma Sister   . Diabetes Sister   . Hypertension Sister     Social History: Social History   Tobacco Use  . Smoking status: Never Smoker  . Smokeless tobacco: Never Used  Vaping Use  . Vaping Use: Never used  Substance Use Topics  . Alcohol use: No  . Drug use: Not Currently    Comment: last used in teens    Allergies:  Allergies  Allergen Reactions  . Morphine And Related Other (See Comments)    Causes "chest to be heavy"  . Motrin [Ibuprofen]     Was told to not take this because it would flare stomach issues    Meds:  Medications Prior to Admission  Medication Sig Dispense Refill Last Dose  . albuterol (VENTOLIN HFA) 108 (90 Base) MCG/ACT inhaler INHALE 2 PUFFS BY MOUTH EVERY 4 HOURS AS NEEDED FOR WHEEZING OR SHORTNESS OF BREATH (COUGH). 18 g 0   . Blood Pressure Monitoring (BLOOD PRESSURE KIT) DEVI 1 Device by  Does not apply route once a week. 1 each 0   . busPIRone (BUSPAR) 10 MG tablet Take 1 tablet by mouth twice daily (Patient not taking: Reported on 03/20/2021) 180 tablet 0   . doxylamine, Sleep, (UNISOM) 25 MG tablet Take 1 tablet (25 mg total) by mouth at bedtime as needed. (Patient not taking: Reported on 03/20/2021) 30 tablet 0   . famotidine (PEPCID) 20 MG tablet Take 1 tablet (20 mg total) by mouth 2 (two) times daily. 60 tablet 1   . fluticasone (FLONASE) 50 MCG/ACT nasal spray Place 1 spray into both nostrils daily. 16 g 1   . ondansetron (ZOFRAN ODT) 4 MG disintegrating tablet Take 1 tablet (4 mg total) by mouth every 8 (eight) hours as needed for nausea or vomiting. 20 tablet 0   . pantoprazole (PROTONIX) 20 MG tablet Take 1 tablet (20 mg total) by mouth daily. 30 tablet 0   . Prenatal Vit-Fe Fumarate-FA (PRENATAL  COMPLETE) 14-0.4 MG TABS Take 1 tablet by mouth daily. 60 tablet 3   . pyridOXINE (VITAMIN B-6) 50 MG tablet Take 1 tablet (50 mg total) by mouth at bedtime. 30 tablet 3   . sertraline (ZOLOFT) 100 MG tablet Take 1 tablet by mouth once daily (Patient not taking: Reported on 03/20/2021) 30 tablet 3     I have reviewed patient's Past Medical Hx, Surgical Hx, Family Hx, Social Hx, medications and allergies.   ROS:  Review of Systems  Constitutional: Negative for chills and fever.  Gastrointestinal: Negative for abdominal pain, nausea and vomiting.  Musculoskeletal: Negative for myalgias.  Neurological: Negative for vertigo and headaches.   Other systems negative  Physical Exam   Patient Vitals for the past 24 hrs:  BP Temp Pulse Resp SpO2 Height Weight  04/11/21 2147 -- -- -- -- 100 % -- --  04/11/21 2146 126/72 -- 76 -- -- -- --  04/11/21 2144 -- 97.9 F (36.6 C) -- 18 -- 5' 2"  (1.575 m) 131.5 kg   Orthostatic VS showed 4m drop in BP from lying to sitting but it came back up with standing.   HR rose by 15 from Lying to standing.   Constitutional: Well-developed, well-nourished female in no acute distress.  Cardiovascular: normal rate and rhythm Respiratory: normal effort GI: Abd soft, non-tender, gravid appropriate for gestational age.   No rebound or guarding. MS: Extremities nontender, no edema, normal ROM Neurologic: Alert and oriented x 4. DTRs 2+ with no clonus.  Normal facial symmetry.  PERRLA.  No conjunctival erethema.  GU: Neg CVAT.  FHT:  155   Labs: Results for orders placed or performed during the hospital encounter of 04/11/21 (from the past 24 hour(s))  CBC     Status: Abnormal   Collection Time: 04/11/21 11:33 PM  Result Value Ref Range   WBC 11.7 (H) 4.0 - 10.5 K/uL   RBC 3.94 3.87 - 5.11 MIL/uL   Hemoglobin 10.9 (L) 12.0 - 15.0 g/dL   HCT 32.8 (L) 36.0 - 46.0 %   MCV 83.2 80.0 - 100.0 fL   MCH 27.7 26.0 - 34.0 pg   MCHC 33.2 30.0 - 36.0 g/dL   RDW 14.2  11.5 - 15.5 %   Platelets 301 150 - 400 K/uL   nRBC 0.0 0.0 - 0.2 %  Urinalysis, Routine w reflex microscopic Urine, Clean Catch     Status: Abnormal   Collection Time: 04/11/21 11:44 PM  Result Value Ref Range   Color, Urine YELLOW YELLOW  APPearance CLOUDY (A) CLEAR   Specific Gravity, Urine 1.017 1.005 - 1.030   pH 6.0 5.0 - 8.0   Glucose, UA NEGATIVE NEGATIVE mg/dL   Hgb urine dipstick NEGATIVE NEGATIVE   Bilirubin Urine NEGATIVE NEGATIVE   Ketones, ur NEGATIVE NEGATIVE mg/dL   Protein, ur NEGATIVE NEGATIVE mg/dL   Nitrite NEGATIVE NEGATIVE   Leukocytes,Ua TRACE (A) NEGATIVE   RBC / HPF 6-10 0 - 5 RBC/hpf   WBC, UA 6-10 0 - 5 WBC/hpf   Bacteria, UA RARE (A) NONE SEEN   Squamous Epithelial / LPF 21-50 0 - 5    AB/Positive/-- (03/01 1159)  Imaging:  No results found.  MAU Course/MDM: I have ordered labs and reviewed results. No signs of dehydration, has very mild anemia which could be contributing.  Suspect blurry vision may be from near syncope or possibly a visual problem needing correction by opthamilogy.(had complained of blurry vision last month also).  WIll recommend starting Iron every other day to boost hemoglobin and use artificial tears daily  If drops dont help, recommend appointment with Opthamologist.    Assessment: Single IUP at 73w6dNear syncope Blurry vision Mild anemia  Plan: Discharge home Rx FeSO4 every other day rx Aritificial tears several times per day Recommend opthamology check up Follow up in Office for prenatal visits   Encouraged to return if she develops worsening of symptoms, increase in pain, fever, or other concerning symptoms.   Pt stable at time of discharge.  MHansel FeinsteinCNM, MSN Certified Nurse-Midwife 04/11/2021 10:56 PM

## 2021-04-12 DIAGNOSIS — D649 Anemia, unspecified: Secondary | ICD-10-CM

## 2021-04-12 DIAGNOSIS — H538 Other visual disturbances: Secondary | ICD-10-CM | POA: Diagnosis not present

## 2021-04-12 DIAGNOSIS — Z3A14 14 weeks gestation of pregnancy: Secondary | ICD-10-CM | POA: Diagnosis not present

## 2021-04-12 DIAGNOSIS — R55 Syncope and collapse: Secondary | ICD-10-CM | POA: Diagnosis present

## 2021-04-12 DIAGNOSIS — O99012 Anemia complicating pregnancy, second trimester: Secondary | ICD-10-CM | POA: Diagnosis not present

## 2021-04-12 DIAGNOSIS — O99891 Other specified diseases and conditions complicating pregnancy: Secondary | ICD-10-CM | POA: Diagnosis not present

## 2021-04-12 LAB — CBC
HCT: 32.8 % — ABNORMAL LOW (ref 36.0–46.0)
Hemoglobin: 10.9 g/dL — ABNORMAL LOW (ref 12.0–15.0)
MCH: 27.7 pg (ref 26.0–34.0)
MCHC: 33.2 g/dL (ref 30.0–36.0)
MCV: 83.2 fL (ref 80.0–100.0)
Platelets: 301 10*3/uL (ref 150–400)
RBC: 3.94 MIL/uL (ref 3.87–5.11)
RDW: 14.2 % (ref 11.5–15.5)
WBC: 11.7 10*3/uL — ABNORMAL HIGH (ref 4.0–10.5)
nRBC: 0 % (ref 0.0–0.2)

## 2021-04-12 LAB — URINALYSIS, ROUTINE W REFLEX MICROSCOPIC
Bilirubin Urine: NEGATIVE
Glucose, UA: NEGATIVE mg/dL
Hgb urine dipstick: NEGATIVE
Ketones, ur: NEGATIVE mg/dL
Nitrite: NEGATIVE
Protein, ur: NEGATIVE mg/dL
Specific Gravity, Urine: 1.017 (ref 1.005–1.030)
pH: 6 (ref 5.0–8.0)

## 2021-04-12 MED ORDER — FERROUS SULFATE 325 (65 FE) MG PO TABS: 325.0000 mg | ORAL_TABLET | ORAL | 1 refills | Status: DC

## 2021-04-12 MED ORDER — HYPROMELLOSE (GONIOSCOPIC) 2.5 % OP SOLN: 1.0000 [drp] | Freq: Three times a day (TID) | OPHTHALMIC | 12 refills | Status: DC | PRN

## 2021-04-12 NOTE — Progress Notes (Signed)
Wynelle Bourgeois CNM in to discuss lab results and d/c plan with pt. Written and verbal d/c instructions given and understanding voiced.

## 2021-04-12 NOTE — Discharge Instructions (Signed)
Blurred Vision, Adult     Having blurred vision means that you cannot see things clearly. Your vision may seem fuzzy or out of focus. It can involve your vision for objects that are close or far away. It may affect one or both eyes. There are many causes of blurred vision, including cataracts, macular degeneration, eye inflammation (uveitis), and diabetic retinopathy. In many cases, blurred vision has to do with the shape of your eye. An abnormal eye shape means you cannot focus well (refractive error). When this happens, it can cause:  Faraway objects to look blurry (nearsightedness).  Close objects to look blurry (farsightedness).  Blurry vision at any distance (astigmatism). Refractive errors are often corrected with glasses or contacts. Blurred vision can be diagnosed based on your symptoms and a physical exam. Tell your health care provider about any other health problems you have, any recent eye injury, and any prior surgeries. You may need to see a health care provider who specializes in eye problems (ophthalmologist). Your treatment will depend on what is causing your blurred vision. Follow these instructions at home:  Keep all follow-up visits as told by your health care provider. This is important. These include any visits to your eye specialists.  Do not drive or use heavy machinery if your vision is blurry.  Use eye drops only as told by your health care provider.  If you were prescribed glasses or contact lenses, wear the glasses or contacts as told by your health care provider.  Schedule eye exams regularly.  Pay attention to any changes in your symptoms. Contact a health care provider if:  Your symptoms do not improve or they get worse.  You have: ? New symptoms. ? A headache. ? Trouble seeing at night. ? Trouble noticing the difference between colors.  You notice: ? Drooping of your eyelids. ? Drainage coming from your eyes. ? A rash around your eyes. Get help  right away if:  You have: ? Severe eye pain. ? A severe headache. ? A sudden change in vision. ? A sudden loss of vision. ? A vision change after an injury.  You notice flashing lights in your field of vision. Your field of vision is the area that you can see without moving your eyes. Summary  Having blurred vision means that you cannot see things clearly. Your vision may seem fuzzy or out of focus.  There are many causes of blurred vision. In many cases, blurred vision has to do with an abnormal eye shape (refractive error), and it can be corrected with glasses or contact lenses.  Pay attention to any changes in your symptoms. Contact a health care provider if your symptoms do not improve or if you have any new symptoms. This information is not intended to replace advice given to you by your health care provider. Make sure you discuss any questions you have with your health care provider. Document Revised: 07/27/2020 Document Reviewed: 07/27/2020 Elsevier Patient Education  [place].  [place] medicine (25th ed., pp. [phone]). [place], [place]: Elsevier.">  Anemia  Anemia is a condition in which there is not enough red blood cells or hemoglobin in the blood. Hemoglobin is a substance in red blood cells that carries oxygen. When you do not have enough red blood cells or hemoglobin (are anemic), your body cannot get enough oxygen and your organs may not work properly. As a result, you may feel very tired or have other problems. What are the causes? Common causes of anemia  include:  Excessive bleeding. Anemia can be caused by excessive bleeding inside or outside the body, including bleeding from the intestines or from heavy menstrual periods in females.  Poor nutrition.  Long-lasting (chronic) kidney, thyroid, and liver disease.  Bone marrow disorders, spleen problems, and blood disorders.  Cancer and treatments for cancer.  HIV (human immunodeficiency virus) and  AIDS (acquired immunodeficiency syndrome).  Infections, medicines, and autoimmune disorders that destroy red blood cells. What are the signs or symptoms? Symptoms of this condition include:  [place] weakness.  Dizziness.  Headache, or difficulties concentrating and sleeping.  Heartbeats that feel irregular or faster than normal (palpitations).  Shortness of breath, especially with exercise.  Pale skin, lips, and nails, or cold hands and feet.  Indigestion and nausea. Symptoms may occur suddenly or develop slowly. If your anemia is mild, you may not have symptoms. How is this diagnosed? This condition is diagnosed based on blood tests, your medical history, and a physical exam. In some cases, a test may be needed in which cells are removed from the soft tissue inside of a bone and looked at under a microscope (bone marrow biopsy). Your health care provider may also check your stool (feces) for blood and may do additional testing to look for the cause of your bleeding. Other tests may include:  Imaging tests, such as a CT scan or MRI.  A procedure to see inside your esophagus and stomach (endoscopy).  A procedure to see inside your colon and rectum (colonoscopy). How is this treated? Treatment for this condition depends on the cause. If you continue to lose a lot of blood, you may need to be treated at a hospital. Treatment may include:  Taking supplements of iron, vitamin Y19, or folic acid.  Taking a hormone medicine (erythropoietin) that can help to stimulate red blood cell growth.  Having a blood transfusion. This may be needed if you lose a lot of blood.  Making changes to your diet.  Having surgery to remove your spleen. Follow these instructions at home:  Take over-the-counter and prescription medicines only as told by your health care provider.  Take supplements only as told by your health care provider.  Follow any diet instructions that you were given by your  health care provider.  Keep all follow-up visits as told by your health care provider. This is important. Contact a health care provider if:  You develop new bleeding anywhere in the body. Get help right away if:  You are very weak.  You are short of breath.  You have pain in your abdomen or chest.  You are dizzy or feel faint.  You have trouble concentrating.  You have bloody stools, black stools, or tarry stools.  You vomit repeatedly or you vomit up blood. These symptoms may represent a serious problem that is an emergency. Do not wait to see if the symptoms will go away. Get medical help right away. Call your local emergency services (911 in the [place] Do not drive yourself to the hospital. Summary  Anemia is a condition in which you do not have enough red blood cells or enough of a substance in your red blood cells that carries oxygen (hemoglobin).  Symptoms may occur suddenly or develop slowly.  If your anemia is mild, you may not have symptoms.  This condition is diagnosed with blood tests, a medical history, and a physical exam. Other tests may be needed.  Treatment for this condition depends on the cause of the anemia.  This information is not intended to replace advice given to you by your health care provider. Make sure you discuss any questions you have with your health care provider. Document Revised: 11/01/2019 Document Reviewed: 11/01/2019 Elsevier Patient Education  2021 [place].  Near-Syncope Near-syncope is when you suddenly feel like you might pass out (faint), but you do not actually lose consciousness. This may also be referred to as presyncope. During an episode of near-syncope, you may:  Feel dizzy, weak, or light-headed.  Feel nauseous.  See all white or all black in your field of vision, or see spots.  Have cold, clammy skin. This condition is caused by a sudden decrease in blood flow to the brain. This decrease can result from various  causes, but most of those causes are not dangerous. However, near-syncope may be a sign of a serious medical problem, so it is important to seek medical care. Follow these instructions at home: Medicines  Take over-the-counter and prescription medicines only as told by your health care provider.  If you are taking blood pressure or heart medicine, get up slowly and take several minutes to sit and then stand. This can reduce dizziness. General instructions  Pay attention to any changes in your symptoms.  Talk with your health care provider about your symptoms. You may need to have testing to understand the cause of your near-syncope.  If you start to feel like you might faint, lie down right away and raise (elevate) your feet above the level of your heart. Breathe deeply and steadily. Wait until all of the symptoms have passed.  Have someone stay with you until you feel stable.  Do not drive, use machinery, or play sports until your health care provider says it is okay.  Drink enough fluid to keep your urine pale yellow.  Keep all follow-up visits as told by your health care provider. This is important. Get help right away if you:  Have a seizure.  Have unusual pain in your chest, abdomen, or back.  Faint once or repeatedly.  Have a severe headache.  Are bleeding from your mouth or rectum, or you have black or tarry stool.  Have a very fast or irregular heartbeat (palpitations).  Are confused.  Have trouble walking.  Have severe weakness.  Have vision problems. These symptoms may represent a serious problem that is an emergency. Do not wait to see if your symptoms will go away. Get medical help right away. Call your local emergency services (911 in the [place] Do not drive yourself to the hospital. Summary  Near-syncope is when you suddenly feel like you might pass out (faint), but you do not actually lose consciousness.  This condition is caused by a sudden decrease in  blood flow to the brain. This decrease can result from various causes, but most of those causes are not dangerous.  Near-syncope may be a sign of a serious medical problem, so it is important to seek medical care. This information is not intended to replace advice given to you by your health care provider. Make sure you discuss any questions you have with your health care provider. Document Revised: 03/18/2019 Document Reviewed: 10/13/2018 Elsevier Patient Education  2021 [employer].

## 2021-04-12 NOTE — MAU Provider Note (Incomplete)
Chief Complaint:  Blurred Vision   Event Date/Time   First Provider Initiated Contact with Patient 04/11/21 2256     HPI  HPI: Veronica [name] is a 30 y.o. G2P1001 at 6w5dho presents to maternity admissions reporting ***. She reports good fetal movement, denies LOF, vaginal bleeding, vaginal itching/burning, urinary symptoms, h/a, dizziness, n/v, diarrhea, constipation or fever/chills.  She denies headache, visual changes or RUQ abdominal pain.  RN Note: For the past couple of days I have had blurry vision and felt faint. 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 Abdominal pain 01/06/2012  . Acute tension headache 07/27/2019  . Anemia   . Ankle pain   . Asthma   . Birth control counseling 08/26/2011  . BMI 40.0-44.9, adult ([facility] 02/27/2011  . GERD (gastroesophageal reflux disease)   . Hiatal hernia   . Obesity (BMI 30-39.9)   . Secondary amenorrhea 03/25/2018    Past obstetric history: OB History  Gravida Para Term Preterm AB Living  _0 SAB IAB Ectopic Multiple Live Births          1    # Outcome Date GA Lbr Len/2nd Weight Sex Delivery Anes PTL Lv  2 Current           1 Term 09/02/10 [id number] g M CS-LTranv Spinal  LIV    Past Surgical History: Past Surgical History:  Procedure Laterality Date  . CESAREAN SECTION  09/02/2010   For macrosomia, but son was 7 lb 12 oz  . TONSILLECTOMY AND ADENOIDECTOMY      Family History: Family History  Problem Relation Age of Onset  . Diabetes Mother   . Hypertension Mother   . Obesity Mother   . Heart attack Mother [id number]     Death  . Obesity Sister   . Osteochondroma Sister   . Diabetes Sister   . Hypertension Sister     Social History: Social History   Tobacco Use  . Smoking status: Never Smoker  . Smokeless tobacco: Never Used  Vaping Use  . Vaping Use: Never used  Substance Use  Topics  . Alcohol use: No  . Drug use: Not Currently    Comment: last used in teens    Allergies:  Allergies  Allergen Reactions  . Morphine And Related Other (See Comments)    Causes "chest to be heavy"  . Motrin [Ibuprofen]     Was told to not take this because it would flare stomach issues    Meds:  Medications Prior to Admission  Medication Sig Dispense Refill Last Dose  . albuterol (VENTOLIN HFA) 108 (90 Base) MCG/ACT inhaler INHALE 2 PUFFS BY MOUTH EVERY 4 HOURS AS NEEDED FOR WHEEZING OR SHORTNESS OF BREATH (COUGH). 18 g 0   . Blood Pressure Monitoring (BLOOD PRESSURE KIT) DEVI 1 Device by Does not apply route once a week. 1 each 0   . busPIRone (BUSPAR) 10 MG tablet Take 1 tablet by mouth twice daily (Patient not taking: Reported on 03/20/2021) 180 tablet 0   . doxylamine, Sleep, (UNISOM) 25 MG tablet Take 1 tablet (25 mg total) by mouth at bedtime as needed. (Patient not taking: Reported on 03/20/2021) 30 tablet 0   . famotidine (PEPCID) 20 MG tablet Take 1 tablet (20 mg total) by mouth 2 (two) times  daily. 60 tablet 1   . fluticasone (FLONASE) 50 MCG/ACT nasal spray Place 1 spray into both nostrils daily. 16 g 1   . ondansetron (ZOFRAN ODT) 4 MG disintegrating tablet Take 1 tablet (4 mg total) by mouth every 8 (eight) hours as needed for nausea or vomiting. 20 tablet 0   . pantoprazole (PROTONIX) 20 MG tablet Take 1 tablet (20 mg total) by mouth daily. 30 tablet 0   . Prenatal Vit-Fe Fumarate-FA (PRENATAL COMPLETE) 14-0.4 MG TABS Take 1 tablet by mouth daily. 60 tablet 3   . pyridOXINE (VITAMIN B-6) 50 MG tablet Take 1 tablet (50 mg total) by mouth at bedtime. 30 tablet 3   . sertraline (ZOLOFT) 100 MG tablet Take 1 tablet by mouth once daily (Patient not taking: Reported on 03/20/2021) 30 tablet 3     I have reviewed patient's Past Medical Hx, Surgical Hx, Family Hx, Social Hx, medications and allergies.   ROS:  Review of Systems Other systems negative  Physical Exam    Patient Vitals for the past 24 hrs:  BP Temp Pulse Resp SpO2 Height Weight  04/11/21 2147 - - - - 100 % - -  04/11/21 2146 126/72 - 76 - - - -  04/11/21 2144 - 97.9 F (36.6 C) - 18 - _0  (1.575 m) 131.5 kg   Constitutional: Well-developed, well-nourished female in no acute distress.  Cardiovascular: normal rate and rhythm Respiratory: normal effort, clear to auscultation bilaterally GI: Abd soft, non-tender, gravid appropriate for gestational age.   No rebound or guarding. MS: Extremities nontender, no edema, normal ROM Neurologic: Alert and oriented x 4.  GU: Neg CVAT.  PELVIC EXAM: Cervix pink, visually closed, without lesion, scant white creamy discharge, vaginal walls and external genitalia normal Bimanual exam: Cervix firm, posterior, neg CMT, uterus nontender, Fundal Height consistent with dates, adnexa without tenderness, enlargement, or mass     FHT:  Baseline *** , moderate variability, accelerations present, no decelerations Contractions: q *** mins Irregular  Rare   Labs: No results found for this or any previous visit (from the past 24 hour(s)). AB/Positive/-- (03/01 1159)  Imaging:  No results found.  MAU Course/MDM: I have ordered labs and reviewed results.  NST reviewed Consult *** with presentation, exam findings and test results.  Treatments in MAU included ***.    Assessment: 1. Supervision of high risk pregnancy, antepartum     Plan: Discharge home Labor precautions and fetal kick counts Follow up in Office for prenatal visits and recheck   Pt stable at time of discharge.  Hansel Feinstein CNM, MSN Certified Nurse-Midwife 04/11/2021 10:56 PM

## 2021-04-17 ENCOUNTER — Other Ambulatory Visit: Payer: Self-pay

## 2021-04-17 ENCOUNTER — Ambulatory Visit (INDEPENDENT_AMBULATORY_CARE_PROVIDER_SITE_OTHER): Payer: BC Managed Care – PPO | Admitting: Family Medicine

## 2021-04-17 VITALS — BP 115/74 | HR 96 | Wt 286.2 lb

## 2021-04-17 DIAGNOSIS — J4520 Mild intermittent asthma, uncomplicated: Secondary | ICD-10-CM

## 2021-04-17 DIAGNOSIS — K219 Gastro-esophageal reflux disease without esophagitis: Secondary | ICD-10-CM

## 2021-04-17 DIAGNOSIS — O99212 Obesity complicating pregnancy, second trimester: Secondary | ICD-10-CM

## 2021-04-17 DIAGNOSIS — Z98891 History of uterine scar from previous surgery: Secondary | ICD-10-CM

## 2021-04-17 DIAGNOSIS — J302 Other seasonal allergic rhinitis: Secondary | ICD-10-CM

## 2021-04-17 DIAGNOSIS — O0990 Supervision of high risk pregnancy, unspecified, unspecified trimester: Secondary | ICD-10-CM

## 2021-04-17 DIAGNOSIS — F411 Generalized anxiety disorder: Secondary | ICD-10-CM

## 2021-04-17 MED ORDER — FLUTICASONE PROPIONATE 50 MCG/ACT NA SUSP: 1.0000 | Freq: Every day | NASAL | 3 refills | Status: DC

## 2021-04-17 MED ORDER — PANTOPRAZOLE SODIUM 20 MG PO TBEC: 20.0000 mg | DELAYED_RELEASE_TABLET | Freq: Every day | ORAL | 2 refills | Status: DC

## 2021-04-17 NOTE — Progress Notes (Signed)
U/S on 05/14/21 @ 8:30a

## 2021-04-17 NOTE — Progress Notes (Signed)
    PRENATAL VISIT NOTE  Subjective:  Veronica [name] is a 30 y.o. G2P1001 at [redacted]w[redacted]d being seen today for ongoing prenatal care.  She is currently monitored for the following issues for this high-risk pregnancy and has Anemia; GERD (gastroesophageal reflux disease); Costochondritis; GAD (generalized anxiety disorder); Pre-diabetes; Subclinical hypothyroidism; Obesity complicating pregnancy; Wheezing; Epigastric pain; Hiatal hernia; Dyspnea; Back pain; Vaginal trichomoniasis; Palpitations; Asthma; Supervision of high risk pregnancy, antepartum; History of cesarean delivery; and Syncope on their problem list.  Patient reports anxiety, has stopped her Buspar and her Zoloft.  Contractions: Not present. Vag. Bleeding: None.  Movement: Absent. Denies leaking of fluid.   The following portions of the patient's history were reviewed and updated as appropriate: allergies, current medications, past family history, past medical history, past social history, past surgical history and problem list.   Objective:   Vitals:   04/17/21 0923  BP: 115/74  Pulse: 96  Weight: 286 lb 3.2 oz (129.8 kg)    Fetal Status:     Movement: Absent     General:  Alert, oriented and cooperative. Patient is in no acute distress.  Skin: Skin is warm and dry. No rash noted.   Cardiovascular: Normal heart rate noted  Respiratory: Normal respiratory effort, no problems with respiration noted  Abdomen: Soft, gravid, appropriate for gestational age.  Pain/Pressure: Present     Pelvic: Cervical exam deferred        Extremities: Normal range of motion.  Edema: Trace  Mental Status: Normal mood and affect. Normal behavior. Normal judgment and thought content.   Assessment and Plan:  Pregnancy: G2P1001 at [redacted]w[redacted]d 1. Supervision of high risk pregnancy, antepartum Anatomy u/s scheduled - AFP, Serum, Open Spina Bifida  2. GAD (generalized anxiety disorder) Mindfulness, sleep hygiene reviewed at length Meds may be  necessary See Asher Muir - Ambulatory referral to Integrated Behavioral [facility]  3. History of cesarean delivery To discuss at later date  4. Mild intermittent asthma, unspecified whether complicated Has inhaler  5. Obesity affecting pregnancy in second trimester States she is SOB at night, unsure about OSA - Ambulatory referral to Sleep Studies  6. Seasonal allergies - fluticasone (FLONASE) 50 MCG/ACT nasal spray; Place 1 spray into both nostrils daily.  Dispense: 16 g; Refill: 3  7. Gastroesophageal reflux disease, unspecified whether esophagitis present - pantoprazole (PROTONIX) 20 MG tablet; Take 1 tablet (20 mg total) by mouth daily.  Dispense: 90 tablet; Refill: 2  Preterm labor symptoms and general obstetric precautions including but not limited to vaginal bleeding, contractions, leaking of fluid and fetal movement were reviewed in detail with the patient. Please refer to After Visit Summary for other counseling recommendations.   Return in 4 weeks (on 05/15/2021) for [facility].  Future Appointments  Date Time Provider Department Center  05/14/2021  8:30 AM [facility] NURSE [facility]  05/14/2021  8:45 AM [facility]  05/15/2021  9:55 AM Reva Bores, MD [facility]    Reva Bores, MD

## 2021-04-17 NOTE — Patient Instructions (Signed)

## 2021-04-19 LAB — AFP, SERUM, OPEN SPINA BIFIDA
AFP MoM: 0.69
AFP Value: 16.7 ng/mL
Gest. Age on Collection Date: 15.4 weeks
Maternal Age At EDD: 29.4 yr
OSBR Risk 1 IN: 10000
Test Results:: NEGATIVE
Weight: 286 [lb_av]

## 2021-05-09 ENCOUNTER — Other Ambulatory Visit: Payer: Self-pay

## 2021-05-09 ENCOUNTER — Encounter (HOSPITAL_COMMUNITY): Payer: Self-pay | Admitting: Obstetrics & Gynecology

## 2021-05-09 ENCOUNTER — Inpatient Hospital Stay (HOSPITAL_COMMUNITY)
Admission: AD | Admit: 2021-05-09 | Discharge: 2021-05-09 | Disposition: A | Discharge status: Home / Self Care | Payer: BC Managed Care – PPO | Attending: Obstetrics & Gynecology | Admitting: Obstetrics & Gynecology

## 2021-05-09 DIAGNOSIS — K219 Gastro-esophageal reflux disease without esophagitis: Secondary | ICD-10-CM | POA: Insufficient documentation

## 2021-05-09 DIAGNOSIS — O99892 Other specified diseases and conditions complicating childbirth: Secondary | ICD-10-CM | POA: Diagnosis not present

## 2021-05-09 DIAGNOSIS — F411 Generalized anxiety disorder: Secondary | ICD-10-CM | POA: Diagnosis not present

## 2021-05-09 DIAGNOSIS — R059 Cough, unspecified: Secondary | ICD-10-CM

## 2021-05-09 DIAGNOSIS — R0602 Shortness of breath: Secondary | ICD-10-CM | POA: Diagnosis not present

## 2021-05-09 DIAGNOSIS — Z79899 Other long term (current) drug therapy: Secondary | ICD-10-CM | POA: Insufficient documentation

## 2021-05-09 DIAGNOSIS — O99342 Other mental disorders complicating pregnancy, second trimester: Secondary | ICD-10-CM | POA: Insufficient documentation

## 2021-05-09 DIAGNOSIS — Z711 Person with feared health complaint in whom no diagnosis is made: Secondary | ICD-10-CM

## 2021-05-09 DIAGNOSIS — J45909 Unspecified asthma, uncomplicated: Secondary | ICD-10-CM | POA: Insufficient documentation

## 2021-05-09 DIAGNOSIS — F419 Anxiety disorder, unspecified: Secondary | ICD-10-CM | POA: Diagnosis not present

## 2021-05-09 DIAGNOSIS — O99212 Obesity complicating pregnancy, second trimester: Secondary | ICD-10-CM | POA: Diagnosis not present

## 2021-05-09 DIAGNOSIS — O99612 Diseases of the digestive system complicating pregnancy, second trimester: Secondary | ICD-10-CM | POA: Diagnosis not present

## 2021-05-09 DIAGNOSIS — O99512 Diseases of the respiratory system complicating pregnancy, second trimester: Secondary | ICD-10-CM | POA: Diagnosis not present

## 2021-05-09 DIAGNOSIS — Z20822 Contact with and (suspected) exposure to covid-19: Secondary | ICD-10-CM | POA: Diagnosis not present

## 2021-05-09 DIAGNOSIS — Z3A18 18 weeks gestation of pregnancy: Secondary | ICD-10-CM | POA: Diagnosis not present

## 2021-05-09 DIAGNOSIS — O26892 Other specified pregnancy related conditions, second trimester: Secondary | ICD-10-CM | POA: Insufficient documentation

## 2021-05-09 DIAGNOSIS — Z885 Allergy status to narcotic agent status: Secondary | ICD-10-CM | POA: Insufficient documentation

## 2021-05-09 DIAGNOSIS — Z886 Allergy status to analgesic agent status: Secondary | ICD-10-CM | POA: Diagnosis not present

## 2021-05-09 LAB — URINALYSIS, ROUTINE W REFLEX MICROSCOPIC
Bilirubin Urine: NEGATIVE
Glucose, UA: NEGATIVE mg/dL
Hgb urine dipstick: NEGATIVE
Ketones, ur: NEGATIVE mg/dL
Leukocytes,Ua: NEGATIVE
Nitrite: NEGATIVE
Protein, ur: NEGATIVE mg/dL
Specific Gravity, Urine: 1.002 — ABNORMAL LOW (ref 1.005–1.030)
pH: 7 (ref 5.0–8.0)

## 2021-05-09 LAB — SARS CORONAVIRUS 2 (TAT 6-24 HRS): SARS Coronavirus 2: NEGATIVE

## 2021-05-09 MED ORDER — BUSPIRONE HCL 10 MG PO TABS: 10.0000 mg | ORAL_TABLET | Freq: Two times a day (BID) | ORAL | 1 refills | Status: DC | PRN

## 2021-05-09 MED ORDER — HYDROXYZINE HCL 25 MG PO TABS
25.0000 mg | ORAL_TABLET | Freq: Once | ORAL | Status: AC
  Administered 2021-05-09: 25 mg via ORAL
  Filled 2021-05-09: qty 1

## 2021-05-09 MED ORDER — SERTRALINE HCL 50 MG PO TABS
50.0000 mg | ORAL_TABLET | Freq: Every day | ORAL | 5 refills | Status: DC
Start: 2021-05-09 — End: 2021-10-21

## 2021-05-09 NOTE — MAU Note (Signed)
.   Veronica [name] is a 30 y.o. at [redacted]w[redacted]d here in MAU reporting: Shortness of breath that started this morning around 0630. States that she feels like it is from her anxiety. She says her doctor told her to d/c her anxiety medication unless she really needed it. Patient has been SOB her entire pregnancy when she walks but this was while resting. Denies VB, LOF, or any pregnancy concerns. Has not been exposed to covid that she is aware of. Also took her BP at home on a new cuff and it was 164/103 and her pulse was 101.   Pain score: 0 Vitals:   05/09/21 0816  BP: 122/63  Pulse: 94  Resp: 18  Temp: 99.1 F (37.3 C)      Lab orders placed from triage: UA

## 2021-05-09 NOTE — Discharge Instructions (Signed)
You were seen in the MAU for shortness of breath. We did an EKG which was normal, and you had normal heart rate, oxygen saturation, and respiratory rate. On the whole we think your symptoms are due to a combination of being farther along in your pregnancy and anxiety. We recommend you restart Zoloft (sertraline) at 50mg  nightly, and you will likely need to increase the dose after a few weeks (discuss this at your next OB appointment). You can also take Buspar as needed for anxiety.   Managing Anxiety, Adult After being diagnosed with an anxiety disorder, you may be relieved to know why you have felt or behaved a certain way. You may also feel overwhelmed about the treatment ahead and what it will mean for your life. With care and support, you can manage this condition and recover from it. How to manage lifestyle changes Managing stress and anxiety Stress is your body's reaction to life changes and events, both good and bad. Most stress will last just a few hours, but stress can be ongoing and can lead to more than just stress. Although stress can play a major role in anxiety, it is not the same as anxiety. Stress is usually caused by something external, such as a deadline, test, or competition. Stress normally passes after the triggering event has ended.  Anxiety is caused by something internal, such as imagining a terrible outcome or worrying that something will go wrong that will devastate you. Anxiety often does not go away even after the triggering event is over, and it can become long-term (chronic) worry. It is important to understand the differences between stress and anxiety and to manage your stress effectively so that it does not lead to an anxious response. Talk with your health care provider or a counselor to learn more about reducing anxiety and stress. He or she may suggest tension reduction techniques, such as:  Music therapy. This can include creating or listening to music that you enjoy  and that inspires you.  Mindfulness-based meditation. This involves being aware of your normal breaths while not trying to control your breathing. It can be done while sitting or walking.  Centering prayer. This involves focusing on a word, phrase, or sacred image that means something to you and brings you peace.  Deep breathing. To do this, expand your stomach and inhale slowly through your nose. Hold your breath for 3-5 seconds. Then exhale slowly, letting your stomach muscles relax.  Self-talk. This involves identifying thought patterns that lead to anxiety reactions and changing those patterns.  Muscle relaxation. This involves tensing muscles and then relaxing them. Choose a tension reduction technique that suits your lifestyle and personality. These techniques take time and practice. Set aside 5-15 minutes a day to do them. Therapists can offer counseling and training in these techniques. The training to help with anxiety may be covered by some insurance plans. Other things you can do to manage stress and anxiety include:  Keeping a stress/anxiety diary. This can help you learn what triggers your reaction and then learn ways to manage your response.  Thinking about how you react to certain situations. You may not be able to control everything, but you can control your response.  Making time for activities that help you relax and not feeling guilty about spending your time in this way.  Visual imagery and yoga can help you stay calm and relax.   Medicines Medicines can help ease symptoms. Medicines for anxiety include:  Anti-anxiety drugs.  Antidepressants. Medicines are often used as a primary treatment for anxiety disorder. Medicines will be prescribed by a health care provider. When used together, medicines, psychotherapy, and tension reduction techniques may be the most effective treatment. Relationships Relationships can play a big part in helping you recover. Try to spend more  time connecting with trusted friends and family members. Consider going to couples counseling, taking family education classes, or going to family therapy. Therapy can help you and others better understand your condition. How to recognize changes in your anxiety Everyone responds differently to treatment for anxiety. Recovery from anxiety happens when symptoms decrease and stop interfering with your daily activities at home or work. This may mean that you will start to:  Have better concentration and focus. Worry will interfere less in your daily thinking.  Sleep better.  Be less irritable.  Have more energy.  Have improved memory. It is important to recognize when your condition is getting worse. Contact your health care provider if your symptoms interfere with home or work and you feel like your condition is not improving. Follow these instructions at home: Activity  Exercise. Most adults should do the following: ? Exercise for at least 150 minutes each week. The exercise should increase your heart rate and make you sweat (moderate-intensity exercise). ? Strengthening exercises at least twice a week.  Get the right amount and quality of sleep. Most adults need 7-9 hours of sleep each night. Lifestyle  Eat a healthy diet that includes plenty of vegetables, fruits, whole grains, low-fat dairy products, and lean protein. Do not eat a lot of foods that are high in solid fats, added sugars, or salt.  Make choices that simplify your life.  Do not use any products that contain nicotine or tobacco, such as cigarettes, e-cigarettes, and chewing tobacco. If you need help quitting, ask your health care provider.  Avoid caffeine, alcohol, and certain over-the-counter cold medicines. These may make you feel worse. Ask your pharmacist which medicines to avoid.   General instructions  Take over-the-counter and prescription medicines only as told by your health care provider.  Keep all follow-up  visits as told by your health care provider. This is important. Where to find support You can get help and support from these sources:  Self-help groups.  Online and [employer].  A trusted spiritual leader.  Couples counseling.  Family education classes.  Family therapy. Where to find more information You may find that joining a support group helps you deal with your anxiety. The following sources can help you locate counselors or support groups near you:  Mental Health America: www.mentalhealthamerica.net  Anxiety and Depression Association of [place] (ADAA): ProgramCam.de  The [employer] on Mental Illness (NAMI): www.nami.org Contact a health care provider if you:  Have a hard time staying focused or finishing daily tasks.  Spend many hours a day feeling worried about everyday life.  Become exhausted by worry.  Start to have headaches, feel tense, or have nausea.  Urinate more than normal.  Have diarrhea. Get help right away if you have:  A racing heart and shortness of breath.  Thoughts of hurting yourself or others. If you ever feel like you may hurt yourself or others, or have thoughts about taking your own life, get help right away. You can go to your nearest emergency department or call:  Your local emergency services (911 in the [place]  A suicide crisis helpline, such as the National Suicide Prevention Lifeline at 2286449192. This is open 24  hours a day. Summary  Taking steps to learn and use tension reduction techniques can help calm you and help prevent triggering an anxiety reaction.  When used together, medicines, psychotherapy, and tension reduction techniques may be the most effective treatment.  Family, friends, and partners can play a big part in helping you recover from an anxiety disorder. This information is not intended to replace advice given to you by your health care provider. Make sure you discuss any questions you have  with your health care provider. Document Revised: 04/26/2019 Document Reviewed: 04/26/2019 Elsevier Patient Education  2021 [employer].

## 2021-05-09 NOTE — MAU Provider Note (Addendum)
History    CSN: [id number]  Arrival date and time: 05/09/21 0800   Event Date/Time   First Provider Initiated Contact with Patient 05/09/21 0915      Chief Complaint  Patient presents with  . Shortness of Breath   Ms. Veronica [name] is a 30 yo G2P1 at 59w5dpresenting with a chief complaint of SOB. She states that the SOB started this morning and is severe. She has a PMH of asthma and took her inhaler this morning which not improve symptoms. She also has a history of anxiety but stopped taking her zoloft and buspirone since being pregnant. Other associated symptoms include a cough but she denies any chest pain, neurological symptoms, fever, sore throat or congestion.    OB History    Gravida  2   Para  1   Term  1   Preterm      AB      Living  1     SAB      IAB      Ectopic      Multiple      Live Births  1           Past Medical History:  Diagnosis Date  . Abdominal pain 01/06/2012  . Acute tension headache 07/27/2019  . Anemia   . Ankle pain   . Asthma   . Birth control counseling 08/26/2011  . [place] 40.0[place]44.9[place]  . GERD (gastroesophageal reflux disease)   . Hiatal hernia   . Obesity (BMI 30-39.9)   . Secondary amenorrhea 03/25/2018    Past Surgical History:  Procedure Laterality Date  . CESAREAN SECTION  09/02/2010   For macrosomia, but son was 7 lb 12 oz  . TONSILLECTOMY AND ADENOIDECTOMY      Family History  Problem Relation Age of Onset  . Diabetes Mother   . Hypertension Mother   . Obesity Mother   . Heart attack Mother 420      Death  . Obesity Sister   . Osteochondroma Sister   . Diabetes Sister   . Hypertension Sister     Social History   Tobacco Use  . Smoking status: Never Smoker  . Smokeless tobacco: Never Used  Vaping Use  . Vaping Use: Never used  Substance Use Topics  . Alcohol use: No  . Drug use: Not Currently    Comment: last used in teens    Allergies:  Allergies  Allergen Reactions  . Morphine  And Related Other (See Comments)    Causes "chest to be heavy"  . Motrin [Ibuprofen]     Was told to not take this because it would flare stomach issues    Medications Prior to Admission  Medication Sig Dispense Refill Last Dose  . albuterol (VENTOLIN HFA) 108 (90 Base) MCG/ACT inhaler INHALE 2 PUFFS BY MOUTH EVERY 4 HOURS AS NEEDED FOR WHEEZING OR SHORTNESS OF BREATH (COUGH). 18 g 0 05/09/2021 at 0800  . Blood Pressure Monitoring (BLOOD PRESSURE KIT) DEVI 1 Device by Does not apply route once a week. 1 each 0 05/09/2021 at Unknown time  . ferrous sulfate 325 (65 FE) MG tablet Take 1 tablet (325 mg total) by mouth every other day. 30 tablet 1 05/08/2021 at Unknown time  . fluticasone (FLONASE) 50 MCG/ACT nasal spray Place 1 spray into both nostrils daily. 16 g 3 05/08/2021 at Unknown time  . pantoprazole (PROTONIX) 20 MG tablet Take 1 tablet (20 mg total) by mouth daily.  90 tablet 2 05/08/2021 at Unknown time  . Prenatal Vit-Fe Fumarate-FA (PRENATAL COMPLETE) 14-0.4 MG TABS Take 1 tablet by mouth daily. 60 tablet 3 05/08/2021 at Unknown time  . busPIRone (BUSPAR) 10 MG tablet Take 1 tablet by mouth twice daily (Patient not taking: Reported on 03/20/2021) 180 tablet 0   . doxylamine, Sleep, (UNISOM) 25 MG tablet Take 1 tablet (25 mg total) by mouth at bedtime as needed. (Patient not taking: Reported on 03/20/2021) 30 tablet 0   . famotidine (PEPCID) 20 MG tablet Take 1 tablet (20 mg total) by mouth 2 (two) times daily. 60 tablet 1   . hydroxypropyl methylcellulose / hypromellose (ISOPTO TEARS / GONIOVISC) 2.5 % ophthalmic solution Place 1 drop into both eyes 3 (three) times daily as needed for dry eyes. (Patient not taking: Reported on 04/17/2021) 15 mL 12   . ondansetron (ZOFRAN ODT) 4 MG disintegrating tablet Take 1 tablet (4 mg total) by mouth every 8 (eight) hours as needed for nausea or vomiting. 20 tablet 0 More than a month at Unknown time  . pyridOXINE (VITAMIN B-6) 50 MG tablet Take 1 tablet (50 mg  total) by mouth at bedtime. (Patient not taking: Reported on 04/17/2021) 30 tablet 3   . sertraline (ZOLOFT) 100 MG tablet Take 1 tablet by mouth once daily (Patient not taking: Reported on 03/20/2021) 30 tablet 3     Review of Systems  Constitutional: Negative.   HENT: Negative for rhinorrhea and sore throat.   Eyes: Negative.   Respiratory: Positive for cough and shortness of breath. Negative for choking, chest tightness and wheezing.   Cardiovascular: Positive for leg swelling (Pt endorses some leg swelling in the past few days however no swelling present today).  Skin: Negative.   Allergic/Immunologic: Negative.   Neurological: Negative.    Physical Exam   Blood pressure 113/62, pulse 92, temperature 99.1 F (37.3 C), temperature source Oral, resp. rate 18, last menstrual period 12/29/2020.  Physical Exam Constitutional:      General: She is not in acute distress.    Appearance: She is obese. She is not ill-appearing or diaphoretic.  Cardiovascular:     Rate and Rhythm: Normal rate and regular rhythm.  Pulmonary:     Effort: Pulmonary effort is normal.     Breath sounds: Normal breath sounds. No decreased breath sounds or wheezing.  Musculoskeletal:     Right lower leg: No edema.     Left lower leg: No edema.  Skin:    General: Skin is warm and dry.  Neurological:     General: No focal deficit present.     Mental Status: She is alert.    Bedside Ultrasound Pt informed that the ultrasound is considered a limited OB ultrasound and is not intended to be a complete ultrasound exam.  Patient also informed that the ultrasound is not being completed with the intent of assessing for fetal or placental anomalies or any pelvic abnormalities.  Explained that the purpose of today's ultrasound is to assess for  viability.  Patient acknowledges the purpose of the exam and the limitations of the study.    My read: viable IUP with FHR 133 bpm  MAU Course  Procedures - Urinalysis -  COVID swab - EKG-12 lead  MDM  #SOB: Given the isolated SOB with normal vital signs and the pt's history of anxiety without current medication use, increased anxiety is the most likely etiology. Also considering asthma although no wheezing heard on auscultation and pt stated  using her inhaler did not help. Other considerations are COVID-19 given associated cough, pulmonary embolism although no recent travel or history of blood clots and peripartum cardiomyopathy although EKG today was normal. Will order Hydroxyzine 60m to help with current anxiety and restart pt's home meds zoloft and buspar.   #Cough: Pt's cough is likely due to acid reflux given her pregnancy status and history of GERD for which she takes Pantoprazole. Also considering COVID-19 given associated SOB although no fever, sore throat or congestion. Will order COVID-19 swab and re-evaluate after results.  Assessment and Plan   #SOB:  - ESTM-19lead - Hydroxyzine 258monce - Restart home med zoloft 5068maily - Restart home med buspar 21m7m needed  #Cough: - COVID-19 swab  #Current Pregnancy: - Urinalysis routine reflex microscopic  Rayad B Shams 05/09/2021, 9:30 AM    ATTENDING ATTESTATION  I have seen and examined this patient, repeated all portions of history and physical exam, and edited the above note as necessary.   MattClarnce Flock/[facility]) 05/09/2021, 11:04 AM

## 2021-05-13 NOTE — BH Specialist Note (Signed)
Integrated Behavioral Health via Telemedicine Visit  05/13/2021 Veronica [name] [id number]  Number of Integrated Behavioral Health visits: 1 Session Start time: 8:21  Session End time: 9:09 Total time:  48  Referring Provider: Merian Capron, MD Patient/Family location: Home [facility] Provider location: [facility]  All persons participating in visit: Patient Veronica [name] and [facility] [employer] [place]   Types of Service: Individual psychotherapy and Video visit  I connected with Merlene Laughter Beem and/or Merlene Laughter Frieson's  n/a  via  Telephone or Video Enabled Telemedicine Application  (Video is Caregility application) and verified that I am speaking with the correct person using two identifiers. Discussed confidentiality: Yes   I discussed the limitations of telemedicine and the availability of in person appointments.  Discussed there is a possibility of technology failure and discussed alternative modes of communication if that failure occurs.  I discussed that engaging in this telemedicine visit, they consent to the provision of behavioral healthcare and the services will be billed under their insurance.  Patient and/or legal guardian expressed understanding and consented to Telemedicine visit: Yes   Presenting Concerns: Patient and/or family reports the following symptoms/concerns: Pt states her primary concern today is worry attributed to high-risk pregnancy; pt feels best when she's able to go to the gym, has not been going since high-risk pregnancy and on work restrictions ("no long standing, no lifting too much"). Pt is taking Zoloft 50mg  as prescribed, walks dog daily, and was previously seen by therapist at hospice.  Duration of problem: Current pregnancy; Severity of problem:  moderately severe  Patient and/or Family's Strengths/Protective Factors: Social connections, Concrete supports in place (healthy food, safe environments,  etc.), and Sense of purpose  Goals Addressed: Patient will:  Reduce symptoms of: anxiety   Increase knowledge and/or ability of: healthy habits and stress reduction   Demonstrate ability to: Increase healthy adjustment to current life circumstances and Increase adequate support systems for patient/family  Progress towards Goals: Ongoing  Interventions: Interventions utilized:  Solution-Focused Strategies, Psychoeducation and/or Health Education, and Link to Standardized Assessments completed:  PHQ9/GAD7 given in past two weeks  Patient and/or Family Response: Pt agrees with treatment plan  Assessment: Patient currently experiencing Generalized anxiety disorder, as previously diagnosed  Patient may benefit from psychoeducation and brief therapeutic interventions regarding coping with symptoms of anxiety .  Plan: Follow up with behavioral health clinician on : Two weeks Behavioral recommendations:  -Continue taking Zoloft and iron pills as prescribed -Resume using sleep sounds nightly for improved sleep -Consider using Worry Time strategy as additional coping with anxious mood -Consider additional resources (on After Visit Summary)for extra support during pregnancy -Discuss concerns regarding high-risk pregnancy with medical provider at upcoming visit Referral(s): Integrated [facility]In Clinic) and Art gallery manager Resources:  Dog/Baby safety, [facility] counseling,  [facility]  I discussed the assessment and treatment plan with the patient and/or parent/guardian. They were provided an opportunity to ask questions and all were answered. They agreed with the plan and demonstrated an understanding of the instructions.   They were advised to call back or seek an in-person evaluation if the symptoms worsen or if the condition fails to improve as anticipated.  [employer], LCSW  Depression screen [facility] 2/9 05/15/2021 03/20/2021 02/20/2021  11/26/2020 10/29/2020  Decreased Interest 1 1 1 1  0  Down, Depressed, Hopeless 1 0 1 1 1   PHQ - 2 Score 2 1 2 2 1   Altered sleeping  1 1 2 1 1   Tired, decreased energy 1 1 2 2 1   Change in appetite 1 0 1 2 1   Feeling bad or failure about yourself  0 0 0 0 0  Trouble concentrating 0 0 0 0 1  Moving slowly or fidgety/restless 0 0 1 0 0  Suicidal thoughts 0 0 0 0 0  PHQ-9 Score 5 3 8 7 5   Difficult doing work/chores Not difficult at all - Somewhat difficult - -  Some recent data might be hidden   GAD 7 : Generalized Anxiety Score 05/15/2021 03/20/2021 02/10/2020 07/05/2019  Nervous, Anxious, on Edge 3 1 1 2   Control/stop worrying 3 1 1 3   Worry too much - different things 3 1 1 2   Trouble relaxing 0 1 2 1   Restless 2 1 1 2   Easily annoyed or irritable 2 2 2 3   Afraid - awful might happen 3 0 2 3  Total GAD 7 Score 16 7 10 16   Anxiety Difficulty Not difficult at all - Not difficult at all Not difficult at all

## 2021-05-14 ENCOUNTER — Ambulatory Visit: Payer: BC Managed Care – PPO

## 2021-05-14 ENCOUNTER — Other Ambulatory Visit: Payer: BC Managed Care – PPO

## 2021-05-15 ENCOUNTER — Other Ambulatory Visit: Payer: Self-pay

## 2021-05-15 ENCOUNTER — Ambulatory Visit (INDEPENDENT_AMBULATORY_CARE_PROVIDER_SITE_OTHER): Payer: BC Managed Care – PPO | Admitting: Family Medicine

## 2021-05-15 VITALS — BP 109/71 | HR 83 | Wt 283.1 lb

## 2021-05-15 DIAGNOSIS — E038 Other specified hypothyroidism: Secondary | ICD-10-CM

## 2021-05-15 DIAGNOSIS — O0990 Supervision of high risk pregnancy, unspecified, unspecified trimester: Secondary | ICD-10-CM

## 2021-05-15 DIAGNOSIS — Z98891 History of uterine scar from previous surgery: Secondary | ICD-10-CM

## 2021-05-15 DIAGNOSIS — R4589 Other symptoms and signs involving emotional state: Secondary | ICD-10-CM

## 2021-05-15 NOTE — Progress Notes (Signed)
   PRENATAL VISIT NOTE  Subjective:  Veronica [name] is a 30 y.o. G2P1001 at [redacted]w[redacted]d being seen today for ongoing prenatal care.  She is currently monitored for the following issues for this high-risk pregnancy and has Anemia; GERD (gastroesophageal reflux disease); Costochondritis; GAD (generalized anxiety disorder); Pre-diabetes; Subclinical hypothyroidism; Obesity complicating pregnancy; Wheezing; Epigastric pain; Hiatal hernia; Dyspnea; Back pain; Vaginal trichomoniasis; Palpitations; Asthma; Supervision of high risk pregnancy, antepartum; History of cesarean delivery; and Syncope on their problem list.  Patient reports no complaints.  Contractions: Not present. Vag. Bleeding: None.  Movement: Absent. Denies leaking of fluid.   The following portions of the patient's history were reviewed and updated as appropriate: allergies, current medications, past family history, past medical history, past social history, past surgical history and problem list.   Objective:   Vitals:   05/15/21 1017  BP: 109/71  Pulse: 83  Weight: 283 lb 1.6 oz (128.4 kg)    Fetal Status:     Movement: Absent     General:  Alert, oriented and cooperative. Patient is in no acute distress.  Skin: Skin is warm and dry. No rash noted.   Cardiovascular: Normal heart rate noted  Respiratory: Normal respiratory effort, no problems with respiration noted  Abdomen: Soft, gravid, appropriate for gestational age.  Pain/Pressure: Present     Pelvic: Cervical exam deferred        Extremities: Normal range of motion.  Edema: None  Mental Status: Normal mood and affect. Normal behavior. Normal judgment and thought content.   Assessment and Plan:  Pregnancy: G2P1001 at [redacted]w[redacted]d 1. Subclinical hypothyroidism Last TSH and Free T4 WNL in 4/22--repeat with 28 wk labs  2. History of cesarean delivery Will discuss later in pregnancy  3. Supervision of high risk pregnancy, antepartum Anatomy u/s missed, will reschedule  today  4. Sad mood Tearful on exam today--unclear why - Ambulatory referral to Integrated Behavioral Health  General obstetric precautions including but not limited to vaginal bleeding, contractions, leaking of fluid and fetal movement were reviewed in detail with the patient. Please refer to After Visit Summary for other counseling recommendations.   Return in 4 weeks (on 06/12/2021).  Future Appointments  Date Time Provider Department Center  05/27/2021  8:15 AM [facility] HEALTH CLINICIAN [facility]  06/13/2021  2:00 PM [facility] NURSE [facility]  06/13/2021  2:15 PM [facility] US2 [facility]  06/13/2021  3:35 PM Alvester Morin, Isa Rankin, MD [facility]    Reva Bores, MD

## 2021-05-15 NOTE — Patient Instructions (Addendum)
You can take Mucinex, (Allegra, Claritin, Zyrtec, Clarinex, Xyzal-any one of these--they are the same class--same as Benadryl), sudafed--behind the counter, saline and steroid nasal sprays No decongestant with phenylephrine.   Breastfeeding  Choosing to breastfeed is one of the best decisions you can make for yourself and your baby. A change in hormones during pregnancy causes your breasts to make breast milk in your milk-producing glands. Hormones prevent breast milk from being released before your baby is born. They also prompt milk flow after birth. Once breastfeeding has begun, thoughts of your baby, as well as his or her sucking or crying, can stimulate the release of milk from your milk-producing glands. Benefits of breastfeeding Research shows that breastfeeding offers many health benefits for infants and mothers. It also offers a cost-free and convenient way to feed your baby. For your baby  Your first milk (colostrum) helps your baby's digestive system to function better.  Special cells in your milk (antibodies) help your baby to fight off infections.  Breastfed babies are less likely to develop asthma, allergies, obesity, or type 2 diabetes. They are also at lower risk for sudden infant death syndrome (SIDS).  Nutrients in breast milk are better able to meet your baby's needs compared to infant formula.  Breast milk improves your baby's brain development. For you  Breastfeeding helps to create a very special bond between you and your baby.  Breastfeeding is convenient. Breast milk costs nothing and is always available at the correct temperature.  Breastfeeding helps to burn calories. It helps you to lose the weight that you gained during pregnancy.  Breastfeeding makes your uterus return faster to its size before pregnancy. It also slows bleeding (lochia) after you give birth.  Breastfeeding helps to lower your risk of developing type 2 diabetes, osteoporosis, rheumatoid  arthritis, cardiovascular disease, and breast, ovarian, uterine, and endometrial cancer later in life. Breastfeeding basics Starting breastfeeding  Find a comfortable place to sit or lie down, with your neck and back well-supported.  Place a pillow or a rolled-up blanket under your baby to bring him or her to the level of your breast (if you are seated). Nursing pillows are specially designed to help support your arms and your baby while you breastfeed.  Make sure that your baby's tummy (abdomen) is facing your abdomen.  Gently massage your breast. With your fingertips, massage from the outer edges of your breast inward toward the nipple. This encourages milk flow. If your milk flows slowly, you may need to continue this action during the feeding.  Support your breast with 4 fingers underneath and your thumb above your nipple (make the letter "C" with your hand). Make sure your fingers are well away from your nipple and your baby's mouth.  Stroke your baby's lips gently with your finger or nipple.  When your baby's mouth is open wide enough, quickly bring your baby to your breast, placing your entire nipple and as much of the areola as possible into your baby's mouth. The areola is the colored area around your nipple. ? More areola should be visible above your baby's upper lip than below the lower lip. ? Your baby's lips should be opened and extended outward (flanged) to ensure an adequate, comfortable latch. ? Your baby's tongue should be between his or her lower gum and your breast.  Make sure that your baby's mouth is correctly positioned around your nipple (latched). Your baby's lips should create a seal on your breast and be turned out (everted).  It is common for your baby to suck about 2-3 minutes in order to start the flow of breast milk. Latching Teaching your baby how to latch onto your breast properly is very important. An improper latch can cause nipple pain, decreased milk  supply, and poor weight gain in your baby. Also, if your baby is not latched onto your nipple properly, he or she may swallow some air during feeding. This can make your baby fussy. Burping your baby when you switch breasts during the feeding can help to get rid of the air. However, teaching your baby to latch on properly is still the best way to prevent fussiness from swallowing air while breastfeeding. Signs that your baby has successfully latched onto your nipple  Silent tugging or silent sucking, without causing you pain. Infant's lips should be extended outward (flanged).  Swallowing heard between every 3-4 sucks once your milk has started to flow (after your let-down milk reflex occurs).  Muscle movement above and in front of his or her ears while sucking. Signs that your baby has not successfully latched onto your nipple  Sucking sounds or smacking sounds from your baby while breastfeeding.  Nipple pain. If you think your baby has not latched on correctly, slip your finger into the corner of your baby's mouth to break the suction and place it between your baby's gums. Attempt to start breastfeeding again. Signs of successful breastfeeding Signs from your baby  Your baby will gradually decrease the number of sucks or will completely stop sucking.  Your baby will fall asleep.  Your baby's body will relax.  Your baby will retain a small amount of milk in his or her mouth.  Your baby will let go of your breast by himself or herself. Signs from you  Breasts that have increased in firmness, weight, and size 1-3 hours after feeding.  Breasts that are softer immediately after breastfeeding.  Increased milk volume, as well as a change in milk consistency and color by the fifth day of breastfeeding.  Nipples that are not sore, cracked, or bleeding. Signs that your baby is getting enough milk  Wetting at least 1-2 diapers during the first 24 hours after birth.  Wetting at least 5-6  diapers every 24 hours for the first week after birth. The urine should be clear or pale yellow by the age of 5 days.  Wetting 6-8 diapers every 24 hours as your baby continues to grow and develop.  At least 3 stools in a 24-hour period by the age of 5 days. The stool should be soft and yellow.  At least 3 stools in a 24-hour period by the age of 7 days. The stool should be seedy and yellow.  No loss of weight greater than 10% of birth weight during the first 3 days of life.  Average weight gain of 4-7 oz (113-198 g) per week after the age of 4 days.  Consistent daily weight gain by the age of 5 days, without weight loss after the age of 2 weeks. After a feeding, your baby may spit up a small amount of milk. This is normal. Breastfeeding frequency and duration Frequent feeding will help you make more milk and can prevent sore nipples and extremely full breasts (breast engorgement). Breastfeed when you feel the need to reduce the fullness of your breasts or when your baby shows signs of hunger. This is called "breastfeeding on demand." Signs that your baby is hungry include:  Increased alertness, activity, or restlessness.  Movement of the head from side to side.  Opening of the mouth when the corner of the mouth or cheek is stroked (rooting).  Increased sucking sounds, smacking lips, cooing, sighing, or squeaking.  Hand-to-mouth movements and sucking on fingers or hands.  Fussing or crying. Avoid introducing a pacifier to your baby in the first 4-6 weeks after your baby is born. After this time, you may choose to use a pacifier. Research has shown that pacifier use during the first year of a baby's life decreases the risk of sudden infant death syndrome (SIDS). Allow your baby to feed on each breast as long as he or she wants. When your baby unlatches or falls asleep while feeding from the first breast, offer the second breast. Because newborns are often sleepy in the first few weeks of  life, you may need to awaken your baby to get him or her to feed. Breastfeeding times will vary from baby to baby. However, the following rules can serve as a guide to help you make sure that your baby is properly fed:  Newborns (babies 72 weeks of age or younger) may breastfeed every 1-3 hours.  Newborns should not go without breastfeeding for longer than 3 hours during the day or 5 hours during the night.  You should breastfeed your baby a minimum of 8 times in a 24-hour period. Breast milk pumping Pumping and storing breast milk allows you to make sure that your baby is exclusively fed your breast milk, even at times when you are unable to breastfeed. This is especially important if you go back to work while you are still breastfeeding, or if you are not able to be present during feedings. Your lactation consultant can help you find a method of pumping that works best for you and give you guidelines about how long it is safe to store breast milk.      Caring for your breasts while you breastfeed Nipples can become dry, cracked, and sore while breastfeeding. The following recommendations can help keep your breasts moisturized and healthy:  Avoid using soap on your nipples.  Wear a supportive bra designed especially for nursing. Avoid wearing underwire-style bras or extremely tight bras (sports bras).  Air-dry your nipples for 3-4 minutes after each feeding.  Use only cotton bra pads to absorb leaked breast milk. Leaking of breast milk between feedings is normal.  Use lanolin on your nipples after breastfeeding. Lanolin helps to maintain your skin's normal moisture barrier. Pure lanolin is not harmful (not toxic) to your baby. You may also hand express a few drops of breast milk and gently massage that milk into your nipples and allow the milk to air-dry. In the first few weeks after giving birth, some women experience breast engorgement. Engorgement can make your breasts feel heavy, warm,  and tender to the touch. Engorgement peaks within 3-5 days after you give birth. The following recommendations can help to ease engorgement:  Completely empty your breasts while breastfeeding or pumping. You may want to start by applying warm, moist heat (in the shower or with warm, water-soaked hand towels) just before feeding or pumping. This increases circulation and helps the milk flow. If your baby does not completely empty your breasts while breastfeeding, pump any extra milk after he or she is finished.  Apply ice packs to your breasts immediately after breastfeeding or pumping, unless this is too uncomfortable for you. To do this: ? Put ice in a plastic bag. ? Place a towel between  your skin and the bag. ? Leave the ice on for 20 minutes, 2-3 times a day.  Make sure that your baby is latched on and positioned properly while breastfeeding. If engorgement persists after 48 hours of following these recommendations, contact your health care provider or a Advertising copywriterlactation consultant. Overall health care recommendations while breastfeeding  Eat 3 healthy meals and 3 snacks every day. Well-nourished mothers who are breastfeeding need an additional 450-500 calories a day. You can meet this requirement by increasing the amount of a balanced diet that you eat.  Drink enough water to keep your urine pale yellow or clear.  Rest often, relax, and continue to take your prenatal vitamins to prevent fatigue, stress, and low vitamin and mineral levels in your body (nutrient deficiencies).  Do not use any products that contain nicotine or tobacco, such as cigarettes and e-cigarettes. Your baby may be harmed by chemicals from cigarettes that pass into breast milk and exposure to secondhand smoke. If you need help quitting, ask your health care provider.  Avoid alcohol.  Do not use illegal drugs or marijuana.  Talk with your health care provider before taking any medicines. These include over-the-counter and  prescription medicines as well as vitamins and herbal supplements. Some medicines that may be harmful to your baby can pass through breast milk.  It is possible to become pregnant while breastfeeding. If birth control is desired, ask your health care provider about options that will be safe while breastfeeding your baby. Where to find more information: [employer]: www.llli.org Contact a health care provider if:  You feel like you want to stop breastfeeding or have become frustrated with breastfeeding.  Your nipples are cracked or bleeding.  Your breasts are red, tender, or warm.  You have: ? Painful breasts or nipples. ? A swollen area on either breast. ? A fever or chills. ? Nausea or vomiting. ? Drainage other than breast milk from your nipples.  Your breasts do not become full before feedings by the fifth day after you give birth.  You feel sad and depressed.  Your baby is: ? Too sleepy to eat well. ? Having trouble sleeping. ? More than 601 week old and wetting fewer than 6 diapers in a 24-hour period. ? Not gaining weight by 555 days of age.  Your baby has fewer than 3 stools in a 24-hour period.  Your baby's skin or the white parts of his or her eyes become yellow. Get help right away if:  Your baby is overly tired (lethargic) and does not want to wake up and feed.  Your baby develops an unexplained fever. Summary  Breastfeeding offers many health benefits for infant and mothers.  Try to breastfeed your infant when he or she shows early signs of hunger.  Gently tickle or stroke your baby's lips with your finger or nipple to allow the baby to open his or her mouth. Bring the baby to your breast. Make sure that much of the areola is in your baby's mouth. Offer one side and burp the baby before you offer the other side.  Talk with your health care provider or lactation consultant if you have questions or you face problems as you breastfeed. This  information is not intended to replace advice given to you by your health care provider. Make sure you discuss any questions you have with your health care provider. Document Revised: 02/18/2018 Document Reviewed: 12/26/2016 Elsevier Patient Education  2021 [employer]

## 2021-05-15 NOTE — Progress Notes (Signed)
Unable to obtain FHR with doppler. Will get bedside US.

## 2021-05-27 ENCOUNTER — Ambulatory Visit (INDEPENDENT_AMBULATORY_CARE_PROVIDER_SITE_OTHER): Payer: HRSA Program | Admitting: Clinical

## 2021-05-27 DIAGNOSIS — Z3A00 Weeks of gestation of pregnancy not specified: Secondary | ICD-10-CM

## 2021-05-27 DIAGNOSIS — O99340 Other mental disorders complicating pregnancy, unspecified trimester: Secondary | ICD-10-CM

## 2021-05-27 DIAGNOSIS — F411 Generalized anxiety disorder: Secondary | ICD-10-CM

## 2021-05-27 NOTE — Patient Instructions (Addendum)
[facility] [address] [place], [place] [address] [phone] (main office) [phone] [facility] office)   [email or web address] Scientist, water quality tour of [facility], register for childbirth classes, etc.)  Familypaws.com (Dog and Baby Safety)  [email or web address] ([facility])    /Emotional Wellbeing Apps and Websites Here are a few free apps meant to help you to help yourself.  To find, try searching on the internet to see if the app is offered on Apple/Android devices. If your first choice doesn't come up on your device, the good news is that there are many choices! Play around with different apps to see which ones are helpful to you.    Calm This is an app meant to help increase calm feelings. Includes info, strategies, and tools for tracking your feelings.      Calm Harm  This app is meant to help with self-harm. Provides many 5-minute or 15-min coping strategies for doing instead of hurting yourself.       Healthy Minds Health Minds is a problem-solving tool to help deal with emotions and cope with stress you encounter wherever you are.      MindShift This app can help people cope with anxiety. Rather than trying to avoid anxiety, you can make an important shift and face it.      MY3  MY3 features a support system, safety plan and resources with the goal of offering a tool to use in a time of need.       My Life My Voice  This mood journal offers a simple solution for tracking your thoughts, feelings and moods. Animated emoticons can help identify your mood.       Relax Melodies Designed to help with sleep, on this app you can mix sounds and meditations for relaxation.      Smiling Mind Smiling Mind is meditation made easy: it's a simple tool that helps put a smile on your mind.        Stop, Breathe & Think  A friendly, simple guide for people through meditations for mindfulness and  compassion.  Stop, Breathe and Think Kids Enter your current feelings and choose a "mission" to help you cope. Offers videos for certain moods instead of just sound recordings.       Team Orange The goal of this tool is to help teens change how they think, act, and react. This app helps you focus on your own good feelings and experiences.      The [employer] Box The [employer] Box (VHB) contains simple tools to help patients with coping, relaxation, distraction, and positive thinking.

## 2021-05-30 NOTE — BH Specialist Note (Signed)
Pt did not arrive to video visit and did not answer the phone; Left HIPPA-compliant message to call back Rashae Rother from [facility] at  [phone] (Janine Reller's office).  ?; left MyChart message for patient.  ? ?

## 2021-06-01 ENCOUNTER — Encounter (HOSPITAL_COMMUNITY): Payer: Self-pay | Admitting: Obstetrics & Gynecology

## 2021-06-01 ENCOUNTER — Inpatient Hospital Stay (HOSPITAL_COMMUNITY)
Admission: AD | Admit: 2021-06-01 | Discharge: 2021-06-01 | Disposition: A | Discharge status: Home / Self Care | Payer: BC Managed Care – PPO | Attending: Obstetrics & Gynecology | Admitting: Obstetrics & Gynecology

## 2021-06-01 ENCOUNTER — Other Ambulatory Visit: Payer: Self-pay

## 2021-06-01 DIAGNOSIS — O99212 Obesity complicating pregnancy, second trimester: Secondary | ICD-10-CM | POA: Insufficient documentation

## 2021-06-01 DIAGNOSIS — Z79899 Other long term (current) drug therapy: Secondary | ICD-10-CM | POA: Diagnosis not present

## 2021-06-01 DIAGNOSIS — K219 Gastro-esophageal reflux disease without esophagitis: Secondary | ICD-10-CM

## 2021-06-01 DIAGNOSIS — Z3A22 22 weeks gestation of pregnancy: Secondary | ICD-10-CM | POA: Diagnosis not present

## 2021-06-01 DIAGNOSIS — O99512 Diseases of the respiratory system complicating pregnancy, second trimester: Secondary | ICD-10-CM | POA: Insufficient documentation

## 2021-06-01 DIAGNOSIS — K5900 Constipation, unspecified: Secondary | ICD-10-CM

## 2021-06-01 DIAGNOSIS — J45909 Unspecified asthma, uncomplicated: Secondary | ICD-10-CM | POA: Diagnosis not present

## 2021-06-01 DIAGNOSIS — O99612 Diseases of the digestive system complicating pregnancy, second trimester: Secondary | ICD-10-CM | POA: Diagnosis not present

## 2021-06-01 DIAGNOSIS — O0990 Supervision of high risk pregnancy, unspecified, unspecified trimester: Secondary | ICD-10-CM

## 2021-06-01 LAB — URINALYSIS, ROUTINE W REFLEX MICROSCOPIC
Bilirubin Urine: NEGATIVE
Glucose, UA: NEGATIVE mg/dL
Hgb urine dipstick: NEGATIVE
Ketones, ur: NEGATIVE mg/dL
Leukocytes,Ua: NEGATIVE
Nitrite: NEGATIVE
Protein, ur: NEGATIVE mg/dL
Specific Gravity, Urine: 1.004 — ABNORMAL LOW (ref 1.005–1.030)
pH: 6 (ref 5.0–8.0)

## 2021-06-01 MED ORDER — DOCUSATE SODIUM 100 MG PO CAPS: 100.0000 mg | ORAL_CAPSULE | Freq: Two times a day (BID) | ORAL | 1 refills | Status: DC

## 2021-06-01 MED ORDER — ALUM & MAG HYDROXIDE-SIMETH 200-200-20 MG/5ML PO SUSP
30.0000 mL | Freq: Once | ORAL | Status: AC
  Administered 2021-06-01: 30 mL via ORAL
  Filled 2021-06-01: qty 30

## 2021-06-01 MED ORDER — PANTOPRAZOLE SODIUM 20 MG PO TBEC: 20.0000 mg | DELAYED_RELEASE_TABLET | Freq: Two times a day (BID) | ORAL | 2 refills | Status: DC

## 2021-06-01 MED ORDER — POLYETHYLENE GLYCOL 3350 17 G PO PACK: 17.0000 g | PACK | Freq: Every day | ORAL | 1 refills | Status: DC

## 2021-06-01 NOTE — MAU Note (Addendum)
 soap suds enema instilled-immediate results-pt up to bathroom

## 2021-06-01 NOTE — MAU Note (Signed)
Pt reports enema effective-large BM

## 2021-06-01 NOTE — MAU Provider Note (Signed)
History     CSN: [id number]  Arrival date and time: 06/01/21 1941   Event Date/Time   First Provider Initiated Contact with Patient 06/01/21 2029      Chief Complaint  Patient presents with   Constipation   Veronica [name] is a 30 y.o. G2P1001 at 43w0dwho [facility]  She presents today for Constipation.  She states she has been experiencing abdominal pain for one week and reports it is "on and off."  She states her stomach feels hard and "I haven't been able to move my bowels."  Patient states she had a bowel movement one week ago and endorses that it was hard to pass, she further reports "only a little bit came out."  She states she has tried Prune juice and taking tums without relief of her symptoms.  She denies a history of constipation.  She also reports "a back up in my throat." Patient states she is taking pantoprazole once daily.     OB History     Gravida  2   Para  1   Term  1   Preterm      AB      Living  1      SAB      IAB      Ectopic      Multiple      Live Births  1           Past Medical History:  Diagnosis Date   Abdominal pain 01/06/2012   Acute tension headache 07/27/2019   Anemia    Ankle pain    Asthma    Birth control counseling 08/26/2011   BMI 40.0-44.9, adult [place] 02/27/2011   GERD (gastroesophageal reflux disease)    Hiatal hernia    Obesity (BMI 30-39.9)    Secondary amenorrhea 03/25/2018    Past Surgical History:  Procedure Laterality Date   CESAREAN SECTION  09/02/2010   For macrosomia, but son was 7 lb 12 oz   TONSILLECTOMY AND ADENOIDECTOMY      Family History  Problem Relation Age of Onset   Diabetes Mother    Hypertension Mother    Obesity Mother    Heart attack Mother 460      Death   Obesity Sister    Osteochondroma Sister    Diabetes Sister    Hypertension Sister     Social History   Tobacco Use   Smoking status: Never   Smokeless tobacco: Never  Vaping Use   Vaping Use: Never  used  Substance Use Topics   Alcohol use: No   Drug use: Not Currently    Comment: last used in teens    Allergies:  Allergies  Allergen Reactions   Morphine And Related Other (See Comments)    Causes "chest to be heavy"   Motrin [Ibuprofen]     Was told to not take this because it would flare stomach issues    Medications Prior to Admission  Medication Sig Dispense Refill Last Dose   albuterol (VENTOLIN HFA) 108 (90 Base) MCG/ACT inhaler INHALE 2 PUFFS BY MOUTH EVERY 4 HOURS AS NEEDED FOR WHEEZING OR SHORTNESS OF BREATH (COUGH). 18 g 0 06/01/2021 at 1200   busPIRone (BUSPAR) 10 MG tablet Take 1 tablet (10 mg total) by mouth 2 (two) times daily as needed (anxiety). 60 tablet 1 06/01/2021 at 0300   cetirizine (ZYRTEC) 5 MG tablet Take 5 mg by mouth daily.   Past Week  ferrous sulfate 325 (65 FE) MG tablet Take 1 tablet (325 mg total) by mouth every other day. 30 tablet 1 05/31/2021   fluticasone (FLONASE) 50 MCG/ACT nasal spray Place 1 spray into both nostrils daily. 16 g 3 Past Week   ondansetron (ZOFRAN ODT) 4 MG disintegrating tablet Take 1 tablet (4 mg total) by mouth every 8 (eight) hours as needed for nausea or vomiting. 20 tablet 0 Past Month   pantoprazole (PROTONIX) 20 MG tablet Take 1 tablet (20 mg total) by mouth daily. 90 tablet 2 06/01/2021   Prenatal Vit-Fe Fumarate-FA (PRENATAL COMPLETE) 14-0.4 MG TABS Take 1 tablet by mouth daily. 60 tablet 3 06/01/2021   sertraline (ZOLOFT) 50 MG tablet Take 1 tablet (50 mg total) by mouth daily. 30 tablet 5 05/31/2021   Blood Pressure Monitoring (BLOOD PRESSURE KIT) DEVI 1 Device by Does not apply route once a week. 1 each 0    famotidine (PEPCID) 20 MG tablet Take 1 tablet (20 mg total) by mouth 2 (two) times daily. 60 tablet 1    hydroxypropyl methylcellulose / hypromellose (ISOPTO TEARS / GONIOVISC) 2.5 % ophthalmic solution Place 1 drop into both eyes 3 (three) times daily as needed for dry eyes. (Patient not taking: No sig reported) 15  mL 12     Review of Systems  Constitutional:  Negative for chills and fever.  Eyes:  Negative for visual disturbance.  Gastrointestinal:  Positive for abdominal pain and constipation. Negative for diarrhea, nausea and vomiting.  Genitourinary:  Negative for difficulty urinating, dysuria, vaginal bleeding and vaginal discharge.  Neurological:  Positive for headaches (None currently, but earlier today). Negative for dizziness and light-headedness.  Physical Exam   Blood pressure 119/61, pulse 91, temperature 98.1 F (36.7 C), resp. rate 18, height _0  (1.575 m), weight 128.8 kg, last menstrual period 12/29/2020, SpO2 98 %.  Physical Exam Constitutional:      General: She is not in acute distress.    Appearance: Normal appearance. She is obese. She is not ill-appearing.  HENT:     Head: Normocephalic and atraumatic.  Eyes:     Conjunctiva/sclera: Conjunctivae normal.  Cardiovascular:     Rate and Rhythm: Regular rhythm.     Heart sounds: Normal heart sounds.  Pulmonary:     Effort: Pulmonary effort is normal. No respiratory distress.     Breath sounds: Normal breath sounds.  Abdominal:     General: Bowel sounds are decreased.     Tenderness: There is no abdominal tenderness.  Musculoskeletal:        General: Normal range of motion.     Cervical back: Normal range of motion.  Skin:    General: Skin is warm and dry.  Neurological:     Mental Status: She is alert and oriented to person, place, and time.  Psychiatric:        Mood and Affect: Mood normal.        Behavior: Behavior normal.        Thought Content: Thought content normal.    MAU Course  Procedures Results for orders placed or performed during the hospital encounter of 06/01/21 (from the past 24 hour(s))  Urinalysis, Routine w reflex microscopic Urine, Clean Catch     Status: Abnormal   Collection Time: 06/01/21  8:06 PM  Result Value Ref Range   Color, Urine STRAW (A) YELLOW   APPearance CLEAR CLEAR    Specific Gravity, Urine 1.004 (L) 1.005 - 1.030   pH 6.0 5.0 -  8.0   Glucose, UA NEGATIVE NEGATIVE mg/dL   Hgb urine dipstick NEGATIVE NEGATIVE   Bilirubin Urine NEGATIVE NEGATIVE   Ketones, ur NEGATIVE NEGATIVE mg/dL   Protein, ur NEGATIVE NEGATIVE mg/dL   Nitrite NEGATIVE NEGATIVE   Leukocytes,Ua NEGATIVE NEGATIVE    MDM Physical Exam Enema Antacid Prescription Assessment and Plan  30 year old, G2P1001  SIUP at 22 weeks Constipation Heartburn  -Reviewed POC with patient. -Discussed options for treatment including enema, laxatives, and/or stool softeners. -Exam performed. -Patient opts for enema now and will order soap suds with plan for laxatives and stool softener scripts -Discussed c/o heartburn and educated on how uncontrolled GERD can cause feelings of SOB when laying down. -Instructed to start taking protonix BID for relief of symptoms. -Will give Maalox now and reassess.  Maryann Conners 06/01/2021, 8:29 PM   Reassessment (9:57 PM)  -Patient reports relief with Maalox and Enema. -Reiterated mgmt of symptoms at home.  -Discussed increased hydration and high fiber diet to promote regular bowel movements. -Given informational sheet on fiber rich foods. -Rx for Protonix modified for twice daily usage. -Rx for Miralax and colace sent to pharmacy on file.  -Patient instructed to take colace twice daily until regular bowel movement established then at bedtime. -Patient verbalizes understanding and without further questions or concerns. -Instructed to monitor heartburn symptoms with modifications in medications. -Encouraged to call or return to MAU if symptoms worsen or with the onset of new symptoms. -Discharged to home in improved condition.  Maryann Conners  MSN, CNM Advanced Practice Provider, Center for [employer]

## 2021-06-01 NOTE — MAU Note (Signed)
Reports she has not had a reg BM x 1 week.  Tried Prune juice and tums. C/O abd pain and bloating.  Feel like she cant catch her breath.

## 2021-06-11 ENCOUNTER — Ambulatory Visit: Payer: BC Managed Care – PPO | Admitting: Clinical

## 2021-06-11 DIAGNOSIS — Z91199 Patient's noncompliance with other medical treatment and regimen due to unspecified reason: Secondary | ICD-10-CM

## 2021-06-13 ENCOUNTER — Encounter: Payer: Self-pay | Admitting: *Deleted

## 2021-06-13 ENCOUNTER — Encounter: Payer: Self-pay | Admitting: Family Medicine

## 2021-06-13 ENCOUNTER — Encounter (HOSPITAL_COMMUNITY): Payer: Self-pay | Admitting: Obstetrics

## 2021-06-13 ENCOUNTER — Ambulatory Visit: Payer: BC Managed Care – PPO | Admitting: *Deleted

## 2021-06-13 ENCOUNTER — Other Ambulatory Visit: Payer: Self-pay

## 2021-06-13 ENCOUNTER — Ambulatory Visit (HOSPITAL_BASED_OUTPATIENT_CLINIC_OR_DEPARTMENT_OTHER): Payer: BC Managed Care – PPO

## 2021-06-13 ENCOUNTER — Other Ambulatory Visit: Payer: Self-pay | Admitting: Family Medicine

## 2021-06-13 ENCOUNTER — Ambulatory Visit (INDEPENDENT_AMBULATORY_CARE_PROVIDER_SITE_OTHER): Payer: BC Managed Care – PPO | Admitting: Family Medicine

## 2021-06-13 ENCOUNTER — Inpatient Hospital Stay (HOSPITAL_COMMUNITY)
Admission: AD | Admit: 2021-06-13 | Discharge: 2021-06-13 | Disposition: A | Discharge status: Home / Self Care | Payer: BC Managed Care – PPO | Attending: Obstetrics & Gynecology | Admitting: Obstetrics & Gynecology

## 2021-06-13 VITALS — BP 113/66 | HR 80

## 2021-06-13 VITALS — BP 113/66 | HR 80 | Wt 281.9 lb

## 2021-06-13 DIAGNOSIS — R0981 Nasal congestion: Secondary | ICD-10-CM | POA: Insufficient documentation

## 2021-06-13 DIAGNOSIS — R0602 Shortness of breath: Secondary | ICD-10-CM | POA: Diagnosis not present

## 2021-06-13 DIAGNOSIS — J45909 Unspecified asthma, uncomplicated: Secondary | ICD-10-CM

## 2021-06-13 DIAGNOSIS — F419 Anxiety disorder, unspecified: Secondary | ICD-10-CM | POA: Insufficient documentation

## 2021-06-13 DIAGNOSIS — Z98891 History of uterine scar from previous surgery: Secondary | ICD-10-CM

## 2021-06-13 DIAGNOSIS — Z3A23 23 weeks gestation of pregnancy: Secondary | ICD-10-CM

## 2021-06-13 DIAGNOSIS — O99212 Obesity complicating pregnancy, second trimester: Secondary | ICD-10-CM | POA: Insufficient documentation

## 2021-06-13 DIAGNOSIS — O99519 Diseases of the respiratory system complicating pregnancy, unspecified trimester: Secondary | ICD-10-CM

## 2021-06-13 DIAGNOSIS — Z79899 Other long term (current) drug therapy: Secondary | ICD-10-CM | POA: Insufficient documentation

## 2021-06-13 DIAGNOSIS — O99342 Other mental disorders complicating pregnancy, second trimester: Secondary | ICD-10-CM | POA: Insufficient documentation

## 2021-06-13 DIAGNOSIS — O26899 Other specified pregnancy related conditions, unspecified trimester: Secondary | ICD-10-CM

## 2021-06-13 DIAGNOSIS — O99282 Endocrine, nutritional and metabolic diseases complicating pregnancy, second trimester: Secondary | ICD-10-CM | POA: Insufficient documentation

## 2021-06-13 DIAGNOSIS — R059 Cough, unspecified: Secondary | ICD-10-CM | POA: Insufficient documentation

## 2021-06-13 DIAGNOSIS — O34219 Maternal care for unspecified type scar from previous cesarean delivery: Secondary | ICD-10-CM | POA: Insufficient documentation

## 2021-06-13 DIAGNOSIS — O99612 Diseases of the digestive system complicating pregnancy, second trimester: Secondary | ICD-10-CM

## 2021-06-13 DIAGNOSIS — K219 Gastro-esophageal reflux disease without esophagitis: Secondary | ICD-10-CM

## 2021-06-13 DIAGNOSIS — E079 Disorder of thyroid, unspecified: Secondary | ICD-10-CM | POA: Insufficient documentation

## 2021-06-13 DIAGNOSIS — D649 Anemia, unspecified: Secondary | ICD-10-CM | POA: Insufficient documentation

## 2021-06-13 DIAGNOSIS — Z3689 Encounter for other specified antenatal screening: Secondary | ICD-10-CM

## 2021-06-13 DIAGNOSIS — O99012 Anemia complicating pregnancy, second trimester: Secondary | ICD-10-CM | POA: Insufficient documentation

## 2021-06-13 DIAGNOSIS — E669 Obesity, unspecified: Secondary | ICD-10-CM | POA: Insufficient documentation

## 2021-06-13 DIAGNOSIS — O99891 Other specified diseases and conditions complicating pregnancy: Secondary | ICD-10-CM

## 2021-06-13 DIAGNOSIS — O26892 Other specified pregnancy related conditions, second trimester: Secondary | ICD-10-CM | POA: Diagnosis not present

## 2021-06-13 DIAGNOSIS — O0990 Supervision of high risk pregnancy, unspecified, unspecified trimester: Secondary | ICD-10-CM

## 2021-06-13 DIAGNOSIS — R519 Headache, unspecified: Secondary | ICD-10-CM

## 2021-06-13 DIAGNOSIS — Z3A24 24 weeks gestation of pregnancy: Secondary | ICD-10-CM | POA: Insufficient documentation

## 2021-06-13 MED ORDER — DM-GUAIFENESIN ER 30-600 MG PO TB12: 1.0000 | ORAL_TABLET | Freq: Two times a day (BID) | ORAL | 1 refills | Status: DC | PRN

## 2021-06-13 MED ORDER — CYCLOBENZAPRINE HCL 10 MG PO TABS: 10.0000 mg | ORAL_TABLET | Freq: Three times a day (TID) | ORAL | 1 refills | Status: DC | PRN

## 2021-06-13 MED ORDER — GUAIFENESIN ER 600 MG PO TB12
600.0000 mg | ORAL_TABLET | Freq: Once | ORAL | Status: AC
  Administered 2021-06-13: 600 mg via ORAL
  Filled 2021-06-13: qty 1

## 2021-06-13 MED ORDER — CALCIUM CARBONATE ANTACID 500 MG PO CHEW
2.0000 | CHEWABLE_TABLET | Freq: Once | ORAL | Status: AC
  Administered 2021-06-13: 400 mg via ORAL
  Filled 2021-06-13: qty 2

## 2021-06-13 NOTE — Progress Notes (Signed)
Pt states is having a lot of headaches,wants to know if she can go back to gym & work out. Pt wants to return to work, so she asked if her restrictions can be removed.

## 2021-06-13 NOTE — Progress Notes (Signed)
   PRENATAL VISIT NOTE  Subjective:  Veronica [name] is a 30 y.o. G2P1001 at [redacted]w[redacted]d being seen today for ongoing prenatal care.  She is currently monitored for the following issues for this high-risk pregnancy and has Anemia; GERD (gastroesophageal reflux disease); GAD (generalized anxiety disorder); Subclinical hypothyroidism; Obesity complicating pregnancy; Epigastric pain; Hiatal hernia; Back pain; Asthma; Supervision of high risk pregnancy, antepartum; History of cesarean delivery; Syncope; Asthma complicating pregnancy, antepartum; and Morbid obesity (HCC) on their problem list.  Patient reports headache- several per week. Had prior to pregnancy as well.  Contractions: Not present. Vag. Bleeding: None.  Movement: Present. Denies leaking of fluid.   The following portions of the patient's history were reviewed and updated as appropriate: allergies, current medications, past family history, past medical history, past social history, past surgical history and problem list.   Objective:   Vitals:   06/13/21 1557  BP: 113/66  Pulse: 80  Weight: 281 lb 14.4 oz (127.9 kg)    Fetal Status: Fetal Heart Rate (bpm): 143   Movement: Present     General:  Alert, oriented and cooperative. Patient is in no acute distress.  Skin: Skin is warm and dry. No rash noted.   Cardiovascular: Normal heart rate noted  Respiratory: Normal respiratory effort, no problems with respiration noted  Abdomen: Soft, gravid, appropriate for gestational age.  Pain/Pressure: Absent     Pelvic: Cervical exam deferred        Extremities: Normal range of motion.     Mental Status: Normal mood and affect. Normal behavior. Normal judgment and thought content.   Assessment and Plan:  Pregnancy: G2P1001 at [redacted]w[redacted]d 1. Headache in pregnancy, antepartum - Had HA prior pregnancy and they are worsening.  - Triggers are likely hydration and fasting/not eating-- encouraged to increase water intake and eat regularly (she had not  eaten until lunch time today and had HA). Reviewed stable BG helps prevent headaches and to eat something every 3 hours, focusing on proteins.  - cyclobenzaprine (FLEXERIL) 10 MG tablet; Take 1 tablet (10 mg total) by mouth every 8 (eight) hours as needed for muscle spasms.  Dispense: 30 tablet; Refill: 1  2. Supervision of high risk pregnancy, antepartum Helped clarify for the patient why she is considered high risk-- this is because of her obesity, prediabetes, and prior CS. I will say that her HA1C was 5.4-5.5 which is not prediabetes. She thought she had hypothyroidism -- which she does have subclinical hypothyroidism, we discussed this at length and reviewed the plan for [place] to assure normal fetal growth -encouraged improved lifestyle measures to decrease her risk  -has BH follow up for anxiety -TWG=-1.6 oz (-0.045 kg) - counseled about steady weight gain in this pregnancy with 11-15 being goal.   3. History of cesarean delivery Discuss at future visit what desired mode of delivery might be  Preterm labor symptoms and general obstetric precautions including but not limited to vaginal bleeding, contractions, leaking of fluid and fetal movement were reviewed in detail with the patient. Please refer to After Visit Summary for other counseling recommendations.   Return in about 4 weeks (around 07/11/2021) for Routine prenatal care, High Risk OB, 28 wk labs.  Future Appointments  Date Time Provider Department Center  07/10/2021  8:45 AM [facility] NURSE [facility]  07/10/2021  9:00 AM [facility] US1 [facility]  07/10/2021  1:15 PM Venora Maples, MD [facility]    Federico Flake, MD

## 2021-06-13 NOTE — MAU Provider Note (Signed)
History     CSN: [id number]  Arrival date and time: 06/13/21 0013   None     Chief Complaint  Patient presents with   Shortness of Breath   HPI Veronica [name] is a 30yo G2P1001 @ 23.5wks who presents for eval of SOB which was precipitated by her coughing. She thought it might be due to her anxiety, but it didn't improve after taking her Zoloft, nor did it improve after using her prn inhaler. Reports some nasal congestion as well. She has been taking Protonix for GERD but there are times when she thinks that her reflux may be causing the cough. Denies H/A, N/V or visual disturbances. No preg related concerns, bleeding, or abd pain.  OB History     Gravida  2   Para  1   Term  1   Preterm      AB      Living  1      SAB      IAB      Ectopic      Multiple      Live Births  1           Past Medical History:  Diagnosis Date   Abdominal pain 01/06/2012   Acute tension headache 07/27/2019   Anemia    Ankle pain    Asthma    Birth control counseling 08/26/2011   BMI 40.0-44.9, adult ([place]) 02/27/2011   GERD (gastroesophageal reflux disease)    Hiatal hernia    Obesity (BMI 30-39.9)    Secondary amenorrhea 03/25/2018    Past Surgical History:  Procedure Laterality Date   CESAREAN SECTION  09/02/2010   For macrosomia, but son was 7 lb 12 oz   TONSILLECTOMY AND ADENOIDECTOMY      Family History  Problem Relation Age of Onset   Diabetes Mother    Hypertension Mother    Obesity Mother    Heart attack Mother 54       Death   Obesity Sister    Osteochondroma Sister    Diabetes Sister    Hypertension Sister     Social History   Tobacco Use   Smoking status: Never   Smokeless tobacco: Never  Vaping Use   Vaping Use: Never used  Substance Use Topics   Alcohol use: No   Drug use: Not Currently    Comment: last used in teens    Allergies:  Allergies  Allergen Reactions   Morphine And Related Other (See Comments)    Causes "chest to be heavy"   Motrin  [Ibuprofen]     Was told to not take this because it would flare stomach issues    Medications Prior to Admission  Medication Sig Dispense Refill Last Dose   albuterol (VENTOLIN HFA) 108 (90 Base) MCG/ACT inhaler INHALE 2 PUFFS BY MOUTH EVERY 4 HOURS AS NEEDED FOR WHEEZING OR SHORTNESS OF BREATH (COUGH). 18 g 0 06/13/2021   busPIRone (BUSPAR) 10 MG tablet Take 1 tablet (10 mg total) by mouth 2 (two) times daily as needed (anxiety). 60 tablet 1 06/12/2021   cetirizine (ZYRTEC) 5 MG tablet Take 5 mg by mouth daily.   06/12/2021   docusate sodium (COLACE) 100 MG capsule Take 1 capsule (100 mg total) by mouth 2 (two) times daily. 60 capsule 1 06/12/2021   ferrous sulfate 325 (65 FE) MG tablet Take 1 tablet (325 mg total) by mouth every other day. 30 tablet 1 06/12/2021   fluticasone (FLONASE) 50 MCG/ACT  nasal spray Place 1 spray into both nostrils daily. 16 g 3 Past Week   ondansetron (ZOFRAN ODT) 4 MG disintegrating tablet Take 1 tablet (4 mg total) by mouth every 8 (eight) hours as needed for nausea or vomiting. 20 tablet 0 Past Month   pantoprazole (PROTONIX) 20 MG tablet Take 1 tablet (20 mg total) by mouth 2 (two) times daily. 90 tablet 2 06/12/2021   Prenatal Vit-Fe Fumarate-FA (PRENATAL COMPLETE) 14-0.4 MG TABS Take 1 tablet by mouth daily. 60 tablet 3 06/12/2021   sertraline (ZOLOFT) 50 MG tablet Take 1 tablet (50 mg total) by mouth daily. 30 tablet 5 06/12/2021   Blood Pressure Monitoring (BLOOD PRESSURE KIT) DEVI 1 Device by Does not apply route once a week. 1 each 0    hydroxypropyl methylcellulose / hypromellose (ISOPTO TEARS / GONIOVISC) 2.5 % ophthalmic solution Place 1 drop into both eyes 3 (three) times daily as needed for dry eyes. (Patient not taking: No sig reported) 15 mL 12    polyethylene glycol (MIRALAX / GLYCOLAX) 17 g packet Take 17 g by mouth daily. 14 each 1     Review of Systems No other pertinents other than what is listed in HPI Physical Exam   Repeat BP: 128/70 Blood pressure (!)  146/79, pulse 99, temperature 99.4 F (37.4 C), temperature source Oral, resp. rate 19, last menstrual period 12/29/2020, SpO2 99 %.  Physical Exam Constitutional:      Appearance: She is obese.  HENT:     Head: Normocephalic.     Mouth/Throat:     Mouth: Mucous membranes are moist.  Cardiovascular:     Rate and Rhythm: Normal rate.  Pulmonary:     Effort: Pulmonary effort is normal.     Breath sounds: Normal breath sounds.     Comments: No visual indicators of SOB Abdominal:     Comments: FHTs dopplered 142bpm  Musculoskeletal:        General: Normal range of motion.     Cervical back: Normal range of motion.  Skin:    General: Skin is warm and dry.  Neurological:     Mental Status: She is alert and oriented to person, place, and time.  Psychiatric:        Mood and Affect: Mood normal.    MAU Course  Procedures  MDM Given Mucinex and Tums  Assessment and Plan  IUP@23 .5wks SOB GERD  D/C home -Reviewed high preg progesterone levels and hyperventilation effect -Rx Mucinex to help w congestion -May try Tums in addition to Protonix to help w GERD -Keep appt later this afternoon as scheduled at [place] 06/13/2021, 1:33 AM

## 2021-06-13 NOTE — MAU Note (Signed)
Pt reports for the last 3 days every time she coughs she feels short of breath. States tonight she thought it might be due to her asthma and so she used her inhaler and took her anxiety medicine and then she was more short of breath. Denies fever. Denies abd pain, vaginal bleeding.

## 2021-06-14 ENCOUNTER — Other Ambulatory Visit: Payer: Self-pay | Admitting: Obstetrics and Gynecology

## 2021-06-14 DIAGNOSIS — O99212 Obesity complicating pregnancy, second trimester: Secondary | ICD-10-CM

## 2021-06-14 DIAGNOSIS — E039 Hypothyroidism, unspecified: Secondary | ICD-10-CM

## 2021-06-14 DIAGNOSIS — O99019 Anemia complicating pregnancy, unspecified trimester: Secondary | ICD-10-CM

## 2021-06-14 DIAGNOSIS — Z362 Encounter for other antenatal screening follow-up: Secondary | ICD-10-CM

## 2021-06-17 DIAGNOSIS — J45909 Unspecified asthma, uncomplicated: Secondary | ICD-10-CM | POA: Insufficient documentation

## 2021-06-17 DIAGNOSIS — O99519 Diseases of the respiratory system complicating pregnancy, unspecified trimester: Secondary | ICD-10-CM | POA: Insufficient documentation

## 2021-06-27 ENCOUNTER — Other Ambulatory Visit: Payer: Self-pay

## 2021-06-27 NOTE — Telephone Encounter (Signed)
Opened in error.   Veronica [name]  06/27/21

## 2021-06-28 ENCOUNTER — Encounter: Payer: Self-pay | Admitting: *Deleted

## 2021-07-10 ENCOUNTER — Ambulatory Visit (INDEPENDENT_AMBULATORY_CARE_PROVIDER_SITE_OTHER): Payer: BC Managed Care – PPO | Admitting: Family Medicine

## 2021-07-10 ENCOUNTER — Other Ambulatory Visit: Payer: Self-pay

## 2021-07-10 ENCOUNTER — Ambulatory Visit: Payer: BC Managed Care – PPO | Admitting: *Deleted

## 2021-07-10 ENCOUNTER — Encounter: Payer: Self-pay | Admitting: Family Medicine

## 2021-07-10 ENCOUNTER — Ambulatory Visit: Payer: BC Managed Care – PPO | Attending: Obstetrics and Gynecology

## 2021-07-10 ENCOUNTER — Encounter: Payer: Self-pay | Admitting: *Deleted

## 2021-07-10 ENCOUNTER — Other Ambulatory Visit: Payer: Self-pay | Admitting: *Deleted

## 2021-07-10 VITALS — BP 113/63 | HR 78

## 2021-07-10 VITALS — BP 109/71 | HR 86 | Wt 290.6 lb

## 2021-07-10 DIAGNOSIS — O99280 Endocrine, nutritional and metabolic diseases complicating pregnancy, unspecified trimester: Secondary | ICD-10-CM | POA: Insufficient documentation

## 2021-07-10 DIAGNOSIS — E039 Hypothyroidism, unspecified: Secondary | ICD-10-CM | POA: Insufficient documentation

## 2021-07-10 DIAGNOSIS — J45909 Unspecified asthma, uncomplicated: Secondary | ICD-10-CM | POA: Diagnosis present

## 2021-07-10 DIAGNOSIS — F419 Anxiety disorder, unspecified: Secondary | ICD-10-CM

## 2021-07-10 DIAGNOSIS — O34219 Maternal care for unspecified type scar from previous cesarean delivery: Secondary | ICD-10-CM | POA: Diagnosis not present

## 2021-07-10 DIAGNOSIS — O99519 Diseases of the respiratory system complicating pregnancy, unspecified trimester: Secondary | ICD-10-CM | POA: Diagnosis present

## 2021-07-10 DIAGNOSIS — R638 Other symptoms and signs concerning food and fluid intake: Secondary | ICD-10-CM

## 2021-07-10 DIAGNOSIS — O99012 Anemia complicating pregnancy, second trimester: Secondary | ICD-10-CM

## 2021-07-10 DIAGNOSIS — O0990 Supervision of high risk pregnancy, unspecified, unspecified trimester: Secondary | ICD-10-CM | POA: Diagnosis not present

## 2021-07-10 DIAGNOSIS — E038 Other specified hypothyroidism: Secondary | ICD-10-CM

## 2021-07-10 DIAGNOSIS — O99019 Anemia complicating pregnancy, unspecified trimester: Secondary | ICD-10-CM | POA: Diagnosis present

## 2021-07-10 DIAGNOSIS — Z23 Encounter for immunization: Secondary | ICD-10-CM | POA: Diagnosis not present

## 2021-07-10 DIAGNOSIS — D649 Anemia, unspecified: Secondary | ICD-10-CM

## 2021-07-10 DIAGNOSIS — Z3A27 27 weeks gestation of pregnancy: Secondary | ICD-10-CM

## 2021-07-10 DIAGNOSIS — O99342 Other mental disorders complicating pregnancy, second trimester: Secondary | ICD-10-CM

## 2021-07-10 DIAGNOSIS — O321XX0 Maternal care for breech presentation, not applicable or unspecified: Secondary | ICD-10-CM

## 2021-07-10 DIAGNOSIS — O99282 Endocrine, nutritional and metabolic diseases complicating pregnancy, second trimester: Secondary | ICD-10-CM

## 2021-07-10 DIAGNOSIS — Z363 Encounter for antenatal screening for malformations: Secondary | ICD-10-CM | POA: Diagnosis not present

## 2021-07-10 DIAGNOSIS — F411 Generalized anxiety disorder: Secondary | ICD-10-CM

## 2021-07-10 DIAGNOSIS — Z362 Encounter for other antenatal screening follow-up: Secondary | ICD-10-CM | POA: Diagnosis not present

## 2021-07-10 DIAGNOSIS — E079 Disorder of thyroid, unspecified: Secondary | ICD-10-CM | POA: Diagnosis not present

## 2021-07-10 DIAGNOSIS — Z98891 History of uterine scar from previous surgery: Secondary | ICD-10-CM

## 2021-07-10 DIAGNOSIS — O99212 Obesity complicating pregnancy, second trimester: Secondary | ICD-10-CM | POA: Insufficient documentation

## 2021-07-10 DIAGNOSIS — O99213 Obesity complicating pregnancy, third trimester: Secondary | ICD-10-CM

## 2021-07-10 NOTE — Progress Notes (Signed)
   Subjective:  Veronica [name] is a 30 y.o. G2P1001 at [redacted]w[redacted]d being seen today for ongoing prenatal care.  She is currently monitored for the following issues for this high-risk pregnancy and has Anemia; GERD (gastroesophageal reflux disease); GAD (generalized anxiety disorder); Subclinical hypothyroidism; Obesity complicating pregnancy; Epigastric pain; Hiatal hernia; Back pain; Asthma; Supervision of high risk pregnancy, antepartum; History of cesarean delivery; Syncope; Asthma complicating pregnancy, antepartum; and Morbid obesity (HCC) on their problem list.  Patient reports  small leakage of clear fluid in two instances since last week .  Contractions: Irritability. Vag. Bleeding: Small.  Movement: Present. Denies leaking of fluid.   The following portions of the patient's history were reviewed and updated as appropriate: allergies, current medications, past family history, past medical history, past social history, past surgical history and problem list. Problem list updated.  Objective:   Vitals:   07/10/21 1331  BP: 109/71  Pulse: 86  Weight: 290 lb 9.6 oz (131.8 kg)    Fetal Status: Fetal Heart Rate (bpm): 145   Movement: Present     General:  Alert, oriented and cooperative. Patient is in no acute distress.  Skin: Skin is warm and dry. No rash noted.   Cardiovascular: Normal heart rate noted  Respiratory: Normal respiratory effort, no problems with respiration noted  Abdomen: Soft, gravid, appropriate for gestational age. Pain/Pressure: Present     Pelvic: Vag. Bleeding: Small Vag D/C Character: Watery   SSE w watery discharge, neg pool         Extremities: Normal range of motion.  Edema: Trace  Mental Status: Normal mood and affect. Normal behavior. Normal judgment and thought content.   Urinalysis:      Assessment and Plan:  Pregnancy: G2P1001 at [redacted]w[redacted]d  1. Supervision of high risk pregnancy, antepartum BP and FHR normal Third trimester labs today with exception of 2hr  GTT (not fasting), will reschedule Accepts TDaP today Reports some leakage of fluid, spec exam shows some thin discharge but no pooling, neg fern slide, ruled out for rupture Following w MFM, last growth/AFI normal, has repeat scheduled - HIV Antibody (routine testing w rflx) - RPR - Antibody screen - CBC - Tdap vaccine greater than or equal to 7yo IM - Glucose Tolerance, 2 Hours w/1 Hour; Future  2. History of cesarean delivery Op note reviewed in chart, elective pLTCS for suspected fetal macrosomia at term, uncomplicated procedure Discussed RCS vs TOLAC, desires TOLAC but nervous. Counseled and given VBAC consent, she will consider and discuss next visit  3. GAD (generalized anxiety disorder) Not addressed at this visit  4. Subclinical hypothyroidism Normal TSH/Free T4 at new OB  5. Obesity affecting pregnancy in third trimester BMI 53  Preterm labor symptoms and general obstetric precautions including but not limited to vaginal bleeding, contractions, leaking of fluid and fetal movement were reviewed in detail with the patient. Please refer to After Visit Summary for other counseling recommendations.  Return in 2 weeks (on 07/24/2021) for [facility], ob visit.   Venora Maples, MD

## 2021-07-10 NOTE — Progress Notes (Signed)
C/o gush of fluid x 2, once last wk, once 07/09/21; no continued leaking; advised to inform OB MD; not currently leaking.

## 2021-07-10 NOTE — Patient Instructions (Signed)

## 2021-07-11 LAB — CBC
Hematocrit: 27.9 % — ABNORMAL LOW (ref 34.0–46.6)
Hemoglobin: 9.3 g/dL — ABNORMAL LOW (ref 11.1–15.9)
MCH: 26.6 pg (ref 26.6–33.0)
MCHC: 33.3 g/dL (ref 31.5–35.7)
MCV: 80 fL (ref 79–97)
Platelets: 266 10*3/uL (ref 150–450)
RBC: 3.5 x10E6/uL — ABNORMAL LOW (ref 3.77–5.28)
RDW: 13.2 % (ref 11.7–15.4)
WBC: 7.7 10*3/uL (ref 3.4–10.8)

## 2021-07-11 LAB — RPR: RPR Ser Ql: NONREACTIVE

## 2021-07-11 LAB — HIV ANTIBODY (ROUTINE TESTING W REFLEX): HIV Screen 4th Generation wRfx: NONREACTIVE

## 2021-07-11 LAB — ANTIBODY SCREEN: Antibody Screen: NEGATIVE

## 2021-07-30 ENCOUNTER — Other Ambulatory Visit: Payer: BC Managed Care – PPO

## 2021-07-30 ENCOUNTER — Ambulatory Visit (INDEPENDENT_AMBULATORY_CARE_PROVIDER_SITE_OTHER): Payer: BC Managed Care – PPO | Admitting: Certified Nurse Midwife

## 2021-07-30 VITALS — BP 122/66 | HR 78 | Wt 291.5 lb

## 2021-07-30 DIAGNOSIS — O99613 Diseases of the digestive system complicating pregnancy, third trimester: Secondary | ICD-10-CM

## 2021-07-30 DIAGNOSIS — M549 Dorsalgia, unspecified: Secondary | ICD-10-CM

## 2021-07-30 DIAGNOSIS — Z3A30 30 weeks gestation of pregnancy: Secondary | ICD-10-CM

## 2021-07-30 DIAGNOSIS — O0993 Supervision of high risk pregnancy, unspecified, third trimester: Secondary | ICD-10-CM

## 2021-07-30 DIAGNOSIS — O99891 Other specified diseases and conditions complicating pregnancy: Secondary | ICD-10-CM

## 2021-07-30 DIAGNOSIS — K219 Gastro-esophageal reflux disease without esophagitis: Secondary | ICD-10-CM

## 2021-07-30 DIAGNOSIS — O0990 Supervision of high risk pregnancy, unspecified, unspecified trimester: Secondary | ICD-10-CM

## 2021-07-30 MED ORDER — MAGNESIUM OXIDE -MG SUPPLEMENT 200 MG PO TABS: 400.0000 mg | ORAL_TABLET | Freq: Every day | ORAL | 3 refills | Status: DC

## 2021-07-30 MED ORDER — OMEPRAZOLE 20 MG PO CPDR: 20.0000 mg | DELAYED_RELEASE_CAPSULE | Freq: Two times a day (BID) | ORAL | 3 refills | Status: DC

## 2021-07-31 LAB — GLUCOSE TOLERANCE, 2 HOURS W/ 1HR
Glucose, 1 hour: 116 mg/dL (ref 65–179)
Glucose, 2 hour: 103 mg/dL (ref 65–152)
Glucose, Fasting: 79 mg/dL (ref 65–91)

## 2021-07-31 NOTE — Progress Notes (Signed)
   PRENATAL VISIT NOTE  Subjective:  Veronica [name] is a 30 y.o. G2P1001 at [redacted]w[redacted]d being seen today for ongoing prenatal care.  She is currently monitored for the following issues for this high-risk pregnancy and has Anemia; GERD (gastroesophageal reflux disease); GAD (generalized anxiety disorder); Subclinical hypothyroidism; Obesity complicating pregnancy; Epigastric pain; Hiatal hernia; Back pain; Asthma; Supervision of high risk pregnancy, antepartum; History of cesarean delivery; Syncope; Asthma complicating pregnancy, antepartum; and Morbid obesity (HCC) on their problem list.  Patient reports backache and heartburn.  Contractions: Irritability. Vag. Bleeding: None.  Movement: Present. Denies leaking of fluid.   The following portions of the patient's history were reviewed and updated as appropriate: allergies, current medications, past family history, past medical history, past social history, past surgical history and problem list.   Objective:   Vitals:   07/30/21 0832  BP: 122/66  Pulse: 78  Weight: 291 lb 8 oz (132.2 kg)    Fetal Status: Fetal Heart Rate (bpm): 141   Movement: Present     General:  Alert, oriented and cooperative. Patient is in no acute distress.  Skin: Skin is warm and dry. No rash noted.   Cardiovascular: Normal heart rate noted  Respiratory: Normal respiratory effort, no problems with respiration noted  Abdomen: Soft, gravid, appropriate for gestational age.  Pain/Pressure: Present     Pelvic: Cervical exam deferred        Extremities: Normal range of motion.  Edema: Trace  Mental Status: Normal mood and affect. Normal behavior. Normal judgment and thought content.   Assessment and Plan:  Pregnancy: G2P1001 at 100w4d 1. Supervision of high risk pregnancy in third trimester - Doing well overall, feeling regular and vigorous fetal movement  2. [redacted] weeks gestation of pregnancy - Routine OB care  3. Back pain affecting pregnancy in third trimester -  Reviewed daily stretches to relieve back pain and encouraged use of maternity belt - Magnesium Oxide (MAG-OXIDE) 200 MG TABS; Take 2 tablets (400 mg total) by mouth at bedtime. If that amount causes loose stools in the am, switch to 200mg  daily at bedtime.  Dispense: 60 tablet; Refill: 3  4. Gastroesophageal reflux during pregnancy in third trimester, antepartum - omeprazole (PRILOSEC) 20 MG capsule; Take 1 capsule (20 mg total) by mouth in the morning and at bedtime.  Dispense: 60 capsule; Refill: 3  Preterm labor symptoms and general obstetric precautions including but not limited to vaginal bleeding, contractions, leaking of fluid and fetal movement were reviewed in detail with the patient. Please refer to After Visit Summary for other counseling recommendations.   Return in about 2 weeks (around 08/13/2021) for IN-PERSON, HOB.  Future Appointments  Date Time Provider Department Center  08/13/2021  1:55 PM 10/13/2021, MD [facility]  08/14/2021  1:00 PM [facility] NURSE [facility] [place]  08/14/2021  1:15 PM [facility] US2 [facility]    10/14/2021, CNM

## 2021-08-06 ENCOUNTER — Inpatient Hospital Stay (HOSPITAL_COMMUNITY)
Admission: AD | Admit: 2021-08-06 | Discharge: 2021-08-06 | Disposition: A | Discharge status: Home / Self Care | Payer: BC Managed Care – PPO | Attending: Obstetrics & Gynecology | Admitting: Obstetrics & Gynecology

## 2021-08-06 ENCOUNTER — Encounter (HOSPITAL_COMMUNITY): Payer: Self-pay | Admitting: Obstetrics & Gynecology

## 2021-08-06 ENCOUNTER — Other Ambulatory Visit: Payer: Self-pay

## 2021-08-06 DIAGNOSIS — J45909 Unspecified asthma, uncomplicated: Secondary | ICD-10-CM

## 2021-08-06 DIAGNOSIS — O99513 Diseases of the respiratory system complicating pregnancy, third trimester: Secondary | ICD-10-CM | POA: Insufficient documentation

## 2021-08-06 DIAGNOSIS — Z3A31 31 weeks gestation of pregnancy: Secondary | ICD-10-CM | POA: Insufficient documentation

## 2021-08-06 DIAGNOSIS — F419 Anxiety disorder, unspecified: Secondary | ICD-10-CM | POA: Insufficient documentation

## 2021-08-06 DIAGNOSIS — Z886 Allergy status to analgesic agent status: Secondary | ICD-10-CM | POA: Diagnosis not present

## 2021-08-06 DIAGNOSIS — F410 Panic disorder [episodic paroxysmal anxiety] without agoraphobia: Secondary | ICD-10-CM | POA: Insufficient documentation

## 2021-08-06 DIAGNOSIS — O99343 Other mental disorders complicating pregnancy, third trimester: Secondary | ICD-10-CM | POA: Diagnosis not present

## 2021-08-06 DIAGNOSIS — Z8249 Family history of ischemic heart disease and other diseases of the circulatory system: Secondary | ICD-10-CM | POA: Diagnosis not present

## 2021-08-06 DIAGNOSIS — R0602 Shortness of breath: Secondary | ICD-10-CM | POA: Diagnosis not present

## 2021-08-06 DIAGNOSIS — Z885 Allergy status to narcotic agent status: Secondary | ICD-10-CM | POA: Insufficient documentation

## 2021-08-06 DIAGNOSIS — Z79899 Other long term (current) drug therapy: Secondary | ICD-10-CM | POA: Diagnosis not present

## 2021-08-06 DIAGNOSIS — O0990 Supervision of high risk pregnancy, unspecified, unspecified trimester: Secondary | ICD-10-CM

## 2021-08-06 LAB — URINALYSIS, ROUTINE W REFLEX MICROSCOPIC
Bilirubin Urine: NEGATIVE
Glucose, UA: NEGATIVE mg/dL
Hgb urine dipstick: NEGATIVE
Ketones, ur: NEGATIVE mg/dL
Leukocytes,Ua: NEGATIVE
Nitrite: NEGATIVE
Protein, ur: NEGATIVE mg/dL
Specific Gravity, Urine: 1.004 — ABNORMAL LOW (ref 1.005–1.030)
pH: 7 (ref 5.0–8.0)

## 2021-08-06 MED ORDER — HYDROXYZINE HCL 10 MG PO TABS: 10.0000 mg | ORAL_TABLET | Freq: Three times a day (TID) | ORAL | 2 refills | Status: DC | PRN

## 2021-08-06 NOTE — MAU Provider Note (Signed)
History    [id number]  Arrival date and time: 08/06/21 1309   Chief Complaint  Patient presents with   Shortness of Breath   HPI Veronica [name] is a 30 y.o. at 37w3dby LMP who presents to MAU after two episodes of shortness of breath. Patient reports a history of anxiety and panic attacks. She was at home sleeping last evening when she woke up and suddenly felt anxious. She started to having a burning sensation throughout her body which typically occurs with her panic attacks. She took a dose of Buspar which she takes as needed when she has these symptoms and tried to redirect her thoughts and cope through it, but she could not control it. She subsequently felt chest tightness and shortness of breath. Eventually, she states that the Buspar took effect and her symptoms improved. Today, she had another episode of anxiety and the same symptoms returned. She then came to MAU for further evaluation. She currently states that she is not having chest pain or SOB. Her symptoms are not exertional. She does note that she has gotten more winded easily as her pregnancy has progressed but this has not been an abrupt change. She denies recent congestion, fever, or cough. She has no other concerns today.  Vaginal bleeding: No LOF: No Fetal Movement: Yes Contractions: No  AB/Positive/-- (03/01 1159)  OB History     Gravida  2   Para  1   Term  1   Preterm      AB      Living  1      SAB      IAB      Ectopic      Multiple      Live Births  1           Past Medical History:  Diagnosis Date   Abdominal pain 01/06/2012   Acute tension headache 07/27/2019   Anemia    Ankle pain    Asthma    Birth control counseling 08/26/2011   [place] 40.[place]44.9[place]   GERD (gastroesophageal reflux disease)    Hiatal hernia    Obesity (BMI 30-39.9)    Secondary amenorrhea 03/25/2018    Past Surgical History:  Procedure Laterality Date   CESAREAN SECTION  09/02/2010   For  macrosomia, but son was 7 lb 12 oz   TONSILLECTOMY AND ADENOIDECTOMY      Family History  Problem Relation Age of Onset   Diabetes Mother    Hypertension Mother    Obesity Mother    Heart attack Mother 462      Death   Obesity Sister    Osteochondroma Sister    Diabetes Sister    Hypertension Sister     Social History   Socioeconomic History   Marital status: Single    Spouse name: Not on file   Number of children: Not on file   Years of education: Not on file   Highest education level: Not on file  Occupational History   Occupation: rRadiographer, therapeutic UNEMPLOYED    Comment: sales  Tobacco Use   Smoking status: Never   Smokeless tobacco: Never  Vaping Use   Vaping Use: Never used  Substance and Sexual Activity   Alcohol use: No   Drug use: Not Currently    Comment: last used in teens   Sexual activity: Not Currently    Birth control/protection: None  Other Topics Concern   Not  on file  Social History Narrative   Lives son ([name] 09/02/2010 ), sister Gabriel Cirri Onley 10/28/94), and mom (Mary Schommer-Gillis) and mom's fiance.    Attending [employer] to obtain GED.   Mom's fiance smokes outside house.    Pet: dog               Social Determinants of Radio broadcast assistant Strain: Not on file  Food Insecurity: No Food Insecurity   Worried About Charity fundraiser in the Last Year: Never true   Ran Out of Food in the Last Year: Never true  Transportation Needs: No Transportation Needs   Lack of Transportation (Medical): No   Lack of Transportation (Non-Medical): No  Physical Activity: Not on file  Stress: Not on file  Social Connections: Not on file  Intimate Partner Violence: Not on file    Allergies  Allergen Reactions   Morphine And Related Other (See Comments)    Causes "chest to be heavy"   Motrin [Ibuprofen]     Was told to not take this because it would flare stomach issues    No current facility-administered medications on file prior  to encounter.   Current Outpatient Medications on File Prior to Encounter  Medication Sig Dispense Refill   albuterol (VENTOLIN HFA) 108 (90 Base) MCG/ACT inhaler INHALE 2 PUFFS BY MOUTH EVERY 4 HOURS AS NEEDED FOR WHEEZING OR SHORTNESS OF BREATH (COUGH). 18 g 0   busPIRone (BUSPAR) 10 MG tablet Take 1 tablet (10 mg total) by mouth 2 (two) times daily as needed (anxiety). 60 tablet 1   cetirizine (ZYRTEC) 5 MG tablet Take 5 mg by mouth daily.     cyclobenzaprine (FLEXERIL) 10 MG tablet Take 1 tablet (10 mg total) by mouth every 8 (eight) hours as needed for muscle spasms. 30 tablet 1   dextromethorphan-guaiFENesin (MUCINEX DM) 30-600 MG 12hr tablet Take 1 tablet by mouth 2 (two) times daily as needed for cough. 30 tablet 1   docusate sodium (COLACE) 100 MG capsule Take 1 capsule (100 mg total) by mouth 2 (two) times daily. 60 capsule 1   ferrous sulfate 325 (65 FE) MG tablet Take 1 tablet (325 mg total) by mouth every other day. 30 tablet 1   pantoprazole (PROTONIX) 20 MG tablet Take 1 tablet (20 mg total) by mouth 2 (two) times daily. 90 tablet 2   polyethylene glycol (MIRALAX / GLYCOLAX) 17 g packet Take 17 g by mouth daily. 14 each 1   Prenatal Vit-Fe Fumarate-FA (PRENATAL COMPLETE) 14-0.4 MG TABS Take 1 tablet by mouth daily. 60 tablet 3   sertraline (ZOLOFT) 50 MG tablet Take 1 tablet (50 mg total) by mouth daily. 30 tablet 5   Blood Pressure Monitoring (BLOOD PRESSURE KIT) DEVI 1 Device by Does not apply route once a week. 1 each 0   fluticasone (FLONASE) 50 MCG/ACT nasal spray Place 1 spray into both nostrils daily. 16 g 3   Magnesium Oxide (MAG-OXIDE) 200 MG TABS Take 2 tablets (400 mg total) by mouth at bedtime. If that amount causes loose stools in the am, switch to 267m daily at bedtime. 60 tablet 3   omeprazole (PRILOSEC) 20 MG capsule Take 1 capsule (20 mg total) by mouth in the morning and at bedtime. 60 capsule 3   ondansetron (ZOFRAN ODT) 4 MG disintegrating tablet Take 1 tablet  (4 mg total) by mouth every 8 (eight) hours as needed for nausea or vomiting. 20 tablet 0  ROS Pertinent positives and negative per HPI, all others reviewed and negative  Physical Exam   BP 105/65   Pulse 98   Temp 98.8 F (37.1 C) (Oral)   Resp 19   Ht _0  (1.575 m)   Wt 133.6 kg   LMP 12/29/2020 (Exact Date)   SpO2 99%   BMI 53.88 kg/m   Physical Exam Constitutional:      General: She is not in acute distress.    Appearance: She is well-developed.  HENT:     Head: Normocephalic and atraumatic.     Mouth/Throat:     Mouth: Mucous membranes are moist.  Cardiovascular:     Rate and Rhythm: Normal rate and regular rhythm.     Heart sounds: Normal heart sounds.  Pulmonary:     Effort: Pulmonary effort is normal.     Breath sounds: Normal breath sounds. No wheezing or rhonchi.  Abdominal:     General: Bowel sounds are normal.     Palpations: Abdomen is soft.     Tenderness: There is no abdominal tenderness.  Musculoskeletal:     Cervical back: Normal range of motion.     Comments: Trace LE edema to mid-shin bilaterally  Skin:    General: Skin is warm and dry.  Neurological:     General: No focal deficit present.     Mental Status: She is alert and oriented to person, place, and time.  Psychiatric:        Mood and Affect: Mood normal.        Behavior: Behavior normal.   FHT Baseline 135 bpm, moderate variability, + accels, - decels Toco: No contractions Category 1 tracing  Labs Results for orders placed or performed during the hospital encounter of 08/06/21 (from the past 24 hour(s))  Urinalysis, Routine w reflex microscopic Urine, Clean Catch     Status: Abnormal   Collection Time: 08/06/21  1:56 PM  Result Value Ref Range   Color, Urine YELLOW YELLOW   APPearance HAZY (A) CLEAR   Specific Gravity, Urine 1.004 (L) 1.005 - 1.030   pH 7.0 5.0 - 8.0   Glucose, UA NEGATIVE NEGATIVE mg/dL   Hgb urine dipstick NEGATIVE NEGATIVE   Bilirubin Urine NEGATIVE  NEGATIVE   Ketones, ur NEGATIVE NEGATIVE mg/dL   Protein, ur NEGATIVE NEGATIVE mg/dL   Nitrite NEGATIVE NEGATIVE   Leukocytes,Ua NEGATIVE NEGATIVE    Imaging No results found.  MAU Course  Procedures Lab Orders         Urinalysis, Routine w reflex microscopic Urine, Clean Catch    Meds ordered this encounter  Medications   hydrOXYzine (ATARAX/VISTARIL) 10 MG tablet    Sig: Take 1 tablet (10 mg total) by mouth 3 (three) times daily as needed for anxiety.    Dispense:  30 tablet    Refill:  2   Imaging Orders  No imaging studies ordered today    MDM Patient presents after two episodes of anxiety associated with SOB.  Exam as above reassuring. Normal cardiopulmonary exam and normal O2 saturations on room air. HDS.  Hx of anxiety attacks. Taking Buspar PRN. Also taking Zoloft nightly. Patient does note that her anxiety is worse since she has become pregnant. No SI or safety concerns. Suspect etiology of symptoms related to poorly controlled anxiety at this time.  Assessment and Plan   # Anxiety - Encouraged patient to continue self-coping strategies and provided reassurance  - Discussed options to optimize anxiety treatment regimen  -  Will stop Buspar and start Hydroxyzine PRN for flares in her anxiety - Recommended that she continue her Zoloft nightly - Advised to return to MAU should she develop worsening SOB or chest pain  - Patient to follow up for ROB visit as scheduled - next appointment 9/6 - Patient voiced understanding and agreeable to plan   Genia Del, MD [facility] Fellow  Faculty Practice

## 2021-08-06 NOTE — MAU Note (Signed)
Pt states she woke up out of sound sleep last night with chest tightness and shortness of breath. Also said her "stomach hurt" and felt burning sensation throughout her body. Has hx of anxiety and asthma. Symptoms subsided until when waiting in lobby, when she says the shortness of breath and burning sensation returned.  Denies cntrx, VB or LOF. +FM.

## 2021-08-13 ENCOUNTER — Other Ambulatory Visit: Payer: Self-pay

## 2021-08-13 ENCOUNTER — Ambulatory Visit (INDEPENDENT_AMBULATORY_CARE_PROVIDER_SITE_OTHER): Payer: Medicaid Other | Admitting: Family Medicine

## 2021-08-13 VITALS — BP 121/73 | HR 88 | Wt 288.8 lb

## 2021-08-13 DIAGNOSIS — Z98891 History of uterine scar from previous surgery: Secondary | ICD-10-CM

## 2021-08-13 DIAGNOSIS — D649 Anemia, unspecified: Secondary | ICD-10-CM

## 2021-08-13 DIAGNOSIS — O0990 Supervision of high risk pregnancy, unspecified, unspecified trimester: Secondary | ICD-10-CM

## 2021-08-13 DIAGNOSIS — O99013 Anemia complicating pregnancy, third trimester: Secondary | ICD-10-CM

## 2021-08-13 DIAGNOSIS — O321XX0 Maternal care for breech presentation, not applicable or unspecified: Secondary | ICD-10-CM

## 2021-08-13 DIAGNOSIS — F419 Anxiety disorder, unspecified: Secondary | ICD-10-CM

## 2021-08-13 MED ORDER — FERROUS SULFATE 325 (65 FE) MG PO TABS: 325.0000 mg | ORAL_TABLET | ORAL | 1 refills | Status: DC

## 2021-08-13 NOTE — Progress Notes (Signed)
Subjective:  Veronica [name] is a 30 y.o. G2P1001 at [redacted]w[redacted]d being seen today for ongoing prenatal care.  She is currently monitored for the following issues for this high-risk pregnancy and has Anemia; GERD (gastroesophageal reflux disease); GAD (generalized anxiety disorder); Subclinical hypothyroidism; Obesity complicating pregnancy; Epigastric pain; Hiatal hernia; Back pain; Asthma; Supervision of high risk pregnancy, antepartum; History of cesarean delivery; Syncope; Asthma complicating pregnancy, antepartum; and Morbid obesity (HCC) on their problem list.  Patient reports no bleeding, no contractions, no cramping, and no leaking.  Contractions: Irritability. Vag. Bleeding: None.  Movement: Present. Denies leaking of fluid.   The following portions of the patient's history were reviewed and updated as appropriate: allergies, current medications, past family history, past medical history, past social history, past surgical history and problem list. Problem list updated.  Objective:   Vitals:   08/13/21 1326  BP: 121/73  Pulse: 88  Weight: 288 lb 12.8 oz (131 kg)    Fetal Status: Fetal Heart Rate (bpm): 148   Movement: Present     General:  Alert, oriented and cooperative. Patient is in no acute distress.  Skin: Skin is warm and dry. No rash noted.   Cardiovascular: Normal heart rate noted  Respiratory: Normal respiratory effort, no problems with respiration noted  Abdomen: Soft, gravid, appropriate for gestational age. Pain/Pressure: Present   BSUS with fetal heart tones at 148bpm, breech presentation  Pelvic: Vag. Bleeding: None     Cervical exam deferred        Extremities: Normal range of motion.  Edema: Trace  Mental Status: Normal mood and affect. Normal behavior. Normal judgment and thought content.   Pt informed that the ultrasound is considered a limited OB ultrasound and is not intended to be a complete ultrasound exam.  Patient also informed that the ultrasound is not  being completed with the intent of assessing for fetal or placental anomalies or any pelvic abnormalities.  Explained that the purpose of today's ultrasound is to assess for  presentation.  Patient acknowledges the purpose of the exam and the limitations of the study.     Assessment and Plan:  Pregnancy: G2P1001 at 104w3d  1. Supervision of high risk pregnancy, antepartum Current pregnancy with hx of obesity and hx of anemia. Fundal height difficult to measure due to maternal body habitus. Heart tones done via BSUS and 148bpm.  - f/up in 2 weeks  2. Breech presentation on bedside US BSUS done for heart tones as unable to find with doppler due to excessive fetal motion and also maternal body habitus. Presentation continues to be breech.  - Has formal US scheduled on 9/7 - since still early third trimester, will plan to follow up her repeat US  - Patient counseled that if continues to be breech at 35 week, will discuss ECV vs. Scheduled rLTCS  3.  History of cesarean delivery Patient with hx of c-section in 2011 for macrosomia. Desires TOLAC. Discussed risks and benefits in detail. Patient signed form. Also discussed that given breech presentation method of delivered will depend on baby's presentation as well. - if continues to be breech can opt for ECV and if successful can TOLAC if tolerating labor - if ECV does not turn baby cephalic will depend on which provider is on regarding if able to opt for vaginal breech delivery. Discussed that likely a repeat c-section will be recommended  4. Anemia, unspecified type Hgb 10.9>9.3 - ferrous sulfate 325 (65 FE) MG tablet; Take 1 tablet (325 mg  total) by mouth every other day.  Dispense: 30 tablet; Refill: 1  Preterm labor symptoms and general obstetric precautions including but not limited to vaginal bleeding, contractions, leaking of fluid and fetal movement were reviewed in detail with the patient. Please refer to After Visit Summary for other  counseling recommendations.  Return in about 2 weeks (around 08/27/2021) for HROB, any provider.  Warner Mccreedy, MD, MPH OB Fellow, Faculty Practice

## 2021-08-14 ENCOUNTER — Encounter: Payer: Self-pay | Admitting: *Deleted

## 2021-08-14 ENCOUNTER — Ambulatory Visit: Payer: Medicaid Other | Admitting: *Deleted

## 2021-08-14 ENCOUNTER — Ambulatory Visit: Payer: Medicaid Other | Attending: Maternal & Fetal Medicine

## 2021-08-14 VITALS — BP 109/62 | HR 80

## 2021-08-14 DIAGNOSIS — O99283 Endocrine, nutritional and metabolic diseases complicating pregnancy, third trimester: Secondary | ICD-10-CM

## 2021-08-14 DIAGNOSIS — E669 Obesity, unspecified: Secondary | ICD-10-CM

## 2021-08-14 DIAGNOSIS — O99013 Anemia complicating pregnancy, third trimester: Secondary | ICD-10-CM | POA: Diagnosis not present

## 2021-08-14 DIAGNOSIS — J45909 Unspecified asthma, uncomplicated: Secondary | ICD-10-CM

## 2021-08-14 DIAGNOSIS — O99519 Diseases of the respiratory system complicating pregnancy, unspecified trimester: Secondary | ICD-10-CM | POA: Insufficient documentation

## 2021-08-14 DIAGNOSIS — O34219 Maternal care for unspecified type scar from previous cesarean delivery: Secondary | ICD-10-CM | POA: Diagnosis not present

## 2021-08-14 DIAGNOSIS — O99213 Obesity complicating pregnancy, third trimester: Secondary | ICD-10-CM | POA: Diagnosis not present

## 2021-08-14 DIAGNOSIS — Z3A32 32 weeks gestation of pregnancy: Secondary | ICD-10-CM

## 2021-08-14 DIAGNOSIS — E079 Disorder of thyroid, unspecified: Secondary | ICD-10-CM | POA: Diagnosis not present

## 2021-08-14 DIAGNOSIS — R638 Other symptoms and signs concerning food and fluid intake: Secondary | ICD-10-CM | POA: Diagnosis not present

## 2021-08-14 DIAGNOSIS — O99343 Other mental disorders complicating pregnancy, third trimester: Secondary | ICD-10-CM | POA: Diagnosis not present

## 2021-08-14 DIAGNOSIS — O0990 Supervision of high risk pregnancy, unspecified, unspecified trimester: Secondary | ICD-10-CM | POA: Diagnosis not present

## 2021-08-15 ENCOUNTER — Other Ambulatory Visit: Payer: Self-pay | Admitting: *Deleted

## 2021-08-15 DIAGNOSIS — Z6841 Body Mass Index (BMI) 40.0 and over, adult: Secondary | ICD-10-CM

## 2021-08-15 DIAGNOSIS — Z3689 Encounter for other specified antenatal screening: Secondary | ICD-10-CM

## 2021-08-28 ENCOUNTER — Telehealth: Payer: Self-pay | Admitting: Lactation Services

## 2021-08-28 ENCOUNTER — Other Ambulatory Visit: Payer: Self-pay

## 2021-08-28 ENCOUNTER — Ambulatory Visit (INDEPENDENT_AMBULATORY_CARE_PROVIDER_SITE_OTHER): Payer: Medicaid Other | Admitting: Family Medicine

## 2021-08-28 VITALS — BP 116/70 | Wt 283.0 lb

## 2021-08-28 DIAGNOSIS — Z98891 History of uterine scar from previous surgery: Secondary | ICD-10-CM

## 2021-08-28 DIAGNOSIS — O99213 Obesity complicating pregnancy, third trimester: Secondary | ICD-10-CM

## 2021-08-28 DIAGNOSIS — Z23 Encounter for immunization: Secondary | ICD-10-CM

## 2021-08-28 DIAGNOSIS — O99519 Diseases of the respiratory system complicating pregnancy, unspecified trimester: Secondary | ICD-10-CM

## 2021-08-28 DIAGNOSIS — J45909 Unspecified asthma, uncomplicated: Secondary | ICD-10-CM

## 2021-08-28 DIAGNOSIS — O0990 Supervision of high risk pregnancy, unspecified, unspecified trimester: Secondary | ICD-10-CM

## 2021-08-28 DIAGNOSIS — F411 Generalized anxiety disorder: Secondary | ICD-10-CM

## 2021-08-28 DIAGNOSIS — E038 Other specified hypothyroidism: Secondary | ICD-10-CM

## 2021-08-28 DIAGNOSIS — O99013 Anemia complicating pregnancy, third trimester: Secondary | ICD-10-CM

## 2021-08-28 MED ORDER — BUDESONIDE 90 MCG/ACT IN AEPB: 1.0000 | INHALATION_SPRAY | Freq: Two times a day (BID) | RESPIRATORY_TRACT | 2 refills | Status: DC

## 2021-08-28 NOTE — Telephone Encounter (Signed)
Called [employer] to discuss cancelled PA for Omeprazole. PA has been completed and faxed on 9/7 and 9/15. Fax confirmations were received.   Called and spoke with in [place] Department and they report I will need to call the Medicaid PA like at [phone].   Called above number and reached out to appeals. Spoke with Milly Jakob, patient advocate. Was informed they do not have the patient in their system for [place]. Was informed that I will need to call the patient to verify the coverage. She reports she does not have access with [place] [employer].   Called [place] [employer] at [phone], spoke with Tamika, call I [phone].  Was transferred to Pharmacy Departments and spoke with [facility]. PA pending # [id number], will take 24 hours for denial or approval. They report we can check the provider portal for approval or will send letter if denied.

## 2021-08-28 NOTE — Progress Notes (Signed)
   PRENATAL VISIT NOTE  Subjective:  Veronica [name] is a 30 y.o. G2P1001 at [redacted]w[redacted]d being seen today for ongoing prenatal care.  She is currently monitored for the following issues for this high-risk pregnancy and has Anemia; GERD (gastroesophageal reflux disease); GAD (generalized anxiety disorder); Subclinical hypothyroidism; Obesity complicating pregnancy; Epigastric pain; Hiatal hernia; Back pain; Asthma; Supervision of high risk pregnancy, antepartum; History of cesarean delivery; Syncope; Asthma complicating pregnancy, antepartum; and Morbid obesity (HCC) on their problem list.  Patient reports  SOB, night time cough, chest pulling .  Contractions: Not present. Vag. Bleeding: None.  Movement: Present. Denies leaking of fluid.   The following portions of the patient's history were reviewed and updated as appropriate: allergies, current medications, past family history, past medical history, past social history, past surgical history and problem list.   Objective:   Vitals:   08/28/21 1419  BP: 116/70  Weight: 283 lb (128.4 kg)    Fetal Status: Fetal Heart Rate (bpm): 140   Movement: Present     General:  Alert, oriented and cooperative. Patient is in no acute distress.  Skin: Skin is warm and dry. No rash noted.   Cardiovascular: Normal heart rate noted  Respiratory: Normal respiratory effort, no problems with respiration noted  Abdomen: Soft, gravid, appropriate for gestational age.  Pain/Pressure: Present     Pelvic: Cervical exam deferred        Extremities: Normal range of motion.  Edema: Trace  Mental Status: Normal mood and affect. Normal behavior. Normal judgment and thought content.   Assessment and Plan:  Pregnancy: G2P1001 at [redacted]w[redacted]d 1. Supervision of high risk pregnancy, antepartum FHT normal - Flu Vaccine QUAD 78mo+IM (Fluarix, Fluzone & Alfiuria Quad PF)  2. History of cesarean delivery Desires TOLAC  3. Anemia affecting pregnancy in third trimester On oral iron.  Check CBC at 36 weeks.  4. Subclinical hypothyroidism TSH during this pregnancy shows normal TSH  5. GAD (generalized anxiety disorder) Schedule with Jamie  6. Obesity affecting pregnancy in third trimester  7. Asthma complicating pregnancy, antepartum Start budesonide.    Preterm labor symptoms and general obstetric precautions including but not limited to vaginal bleeding, contractions, leaking of fluid and fetal movement were reviewed in detail with the patient. Please refer to After Visit Summary for other counseling recommendations.   Return in about 2 weeks (around 09/11/2021) for HR OB f/u.  Future Appointments  Date Time Provider Department Center  08/29/2021  2:15 PM [facility] NURSE [facility]  08/29/2021  2:30 PM [facility] US3 [facility]  09/05/2021  2:00 PM [facility] NURSE [facility]  09/05/2021  2:15 PM [facility] US2 [facility]  09/12/2021  2:30 PM [facility] NURSE [facility]  09/12/2021  2:45 PM [facility] US5 [facility]  09/19/2021  2:30 PM [facility] NURSE [facility]  09/19/2021  2:45 PM [facility] US5 [facility]    Levie Heritage, DO

## 2021-08-29 ENCOUNTER — Ambulatory Visit: Payer: Medicaid Other | Admitting: *Deleted

## 2021-08-29 ENCOUNTER — Ambulatory Visit: Payer: Medicaid Other | Attending: Obstetrics and Gynecology

## 2021-08-29 ENCOUNTER — Encounter: Payer: Self-pay | Admitting: General Practice

## 2021-08-29 ENCOUNTER — Encounter: Payer: Self-pay | Admitting: *Deleted

## 2021-08-29 VITALS — BP 106/60 | HR 72

## 2021-08-29 DIAGNOSIS — D649 Anemia, unspecified: Secondary | ICD-10-CM | POA: Diagnosis not present

## 2021-08-29 DIAGNOSIS — Z363 Encounter for antenatal screening for malformations: Secondary | ICD-10-CM | POA: Diagnosis not present

## 2021-08-29 DIAGNOSIS — O99012 Anemia complicating pregnancy, second trimester: Secondary | ICD-10-CM

## 2021-08-29 DIAGNOSIS — O99342 Other mental disorders complicating pregnancy, second trimester: Secondary | ICD-10-CM

## 2021-08-29 DIAGNOSIS — O0990 Supervision of high risk pregnancy, unspecified, unspecified trimester: Secondary | ICD-10-CM

## 2021-08-29 DIAGNOSIS — O99343 Other mental disorders complicating pregnancy, third trimester: Secondary | ICD-10-CM | POA: Diagnosis not present

## 2021-08-29 DIAGNOSIS — O99213 Obesity complicating pregnancy, third trimester: Secondary | ICD-10-CM | POA: Diagnosis not present

## 2021-08-29 DIAGNOSIS — Z6841 Body Mass Index (BMI) 40.0 and over, adult: Secondary | ICD-10-CM

## 2021-08-29 DIAGNOSIS — O99013 Anemia complicating pregnancy, third trimester: Secondary | ICD-10-CM | POA: Insufficient documentation

## 2021-08-29 DIAGNOSIS — O34219 Maternal care for unspecified type scar from previous cesarean delivery: Secondary | ICD-10-CM

## 2021-08-29 DIAGNOSIS — Z3A34 34 weeks gestation of pregnancy: Secondary | ICD-10-CM | POA: Diagnosis not present

## 2021-08-29 DIAGNOSIS — J45909 Unspecified asthma, uncomplicated: Secondary | ICD-10-CM

## 2021-08-29 DIAGNOSIS — F419 Anxiety disorder, unspecified: Secondary | ICD-10-CM | POA: Insufficient documentation

## 2021-08-29 DIAGNOSIS — Z3689 Encounter for other specified antenatal screening: Secondary | ICD-10-CM

## 2021-08-30 NOTE — BH Specialist Note (Signed)
Integrated Behavioral Health via Telemedicine Visit  08/30/2021 Myrissa Chipley Steuart [id number]  Number of Integrated Behavioral Health visits: 2 Session Start time: 1:17  Session End time: 2:06 Total time:  96  Referring Provider: Merian Capron, MD Patient/Family location: Home [facility] Provider location: Center for [facility]  All persons participating in visit: Patient Aneisa Harner and [facility] Benedetta Sundstrom   Types of Service: Individual psychotherapy and Video visit  I connected with Merlene Laughter Saffo and/or Merlene Laughter Heydt's  n/a  via  Telephone or Video Enabled Telemedicine Application  (Video is Caregility application) and verified that I am speaking with the correct person using two identifiers. Discussed confidentiality: Yes   I discussed the limitations of telemedicine and the availability of in person appointments.  Discussed there is a possibility of technology failure and discussed alternative modes of communication if that failure occurs.  I discussed that engaging in this telemedicine visit, they consent to the provision of behavioral healthcare and the services will be billed under their insurance.  Patient and/or legal guardian expressed understanding and consented to Telemedicine visit: Yes   Presenting Concerns: Patient and/or family reports the following symptoms/concerns: Waking up in panic, sleep difficulty, worry over unknowns in childbirth; taking medication and walking dog helps manage anxiety in daytime; open to implementing self-coping strategy for evening/sleep.  Duration of problem: Increase in pregnancy; Severity of problem:  moderately severe  Patient and/or Family's Strengths/Protective Factors: Social connections, Concrete supports in place (healthy food, safe environments, etc.), and Sense of purpose  Goals Addressed: Patient will:  Reduce symptoms of: anxiety   Increase knowledge and/or ability of: healthy  habits and self-management skills   Demonstrate ability to: Increase healthy adjustment to current life circumstances and Increase motivation to adhere to plan of care  Progress towards Goals: Ongoing  Interventions: Interventions utilized:  Mindfulness or Management consultant, Sleep Hygiene, and Psychoeducation and/or Health Education Standardized Assessments completed:  PHQ9/GAD7 given in past two weeks  Patient and/or Family Response: Pt agrees with treatment plan  Assessment: Patient currently experiencing Generalized anxiety disorder.   Patient may benefit from psychoeducation and brief therapeutic interventions regarding coping with symptoms of anxiety with panic attacks.  Plan: Follow up with behavioral health clinician on : One week Behavioral recommendations:  -CALM relaxation breathing exercise twice daily (morning; at bedtime with sleep sounds) -Continue taking BH medication as prescribed -Continue walking dog at least twice daily -Consider reading through Postpartum Planner on After Visit Summary  Referral(s): Integrated [employer] (In Clinic)  I discussed the assessment and treatment plan with the patient and/or parent/guardian. They were provided an opportunity to ask questions and all were answered. They agreed with the plan and demonstrated an understanding of the instructions.   They were advised to call back or seek an in-person evaluation if the symptoms worsen or if the condition fails to improve as anticipated.  [name], LCSW  Depression screen [facility] 2/9 08/28/2021 08/13/2021 07/10/2021 06/13/2021 05/15/2021  Decreased Interest 1 1 1 1 1   Down, Depressed, Hopeless 2 1 1 1 1   PHQ - 2 Score 3 2 2 2 2   Altered sleeping 2 1 1 1 1   Tired, decreased energy 1 1 1 1 1   Change in appetite 0 1 1 1 1   Feeling bad or failure about yourself  0 0 0 0 0  Trouble concentrating 0 0 0 0 0  Moving slowly or fidgety/restless 0 0 0 0 0  Suicidal thoughts 0 0 0  0 0  PHQ-9 Score 6 5 5 5 5   Difficult doing work/chores - - - - Not difficult at all  Some recent data might be hidden   GAD 7 : Generalized Anxiety Score 08/28/2021 08/13/2021 07/10/2021 06/13/2021  Nervous, Anxious, on Edge 3 2 2 2   Control/stop worrying 3 3 2 2   Worry too much - different things 3 3 2 2   Trouble relaxing 1 1 1 1   Restless 1 1 0 1  Easily annoyed or irritable 1 1 3 3   Afraid - awful might happen 2 2 0 2  Total GAD 7 Score 14 13 10 13   Anxiety Difficulty - - - -

## 2021-09-02 ENCOUNTER — Telehealth: Payer: Self-pay

## 2021-09-02 NOTE — Telephone Encounter (Signed)
Call placed to pt. Pt states needing Rx budesomide that Dr Adrian Blackwater wrote at last appt on 08/29/21. Pt states pharmacy needing PA. Is not covered by Medicaid.   Judeth Cornfield, RN

## 2021-09-02 NOTE — Telephone Encounter (Signed)
Patient called in stating that she is having a hard time filing this Rx and the pharmacy asked hr to contact the office for further help   Budesonide 90 MCG/ACT inhaler [[id number]]   Please call and advise

## 2021-09-03 ENCOUNTER — Ambulatory Visit (INDEPENDENT_AMBULATORY_CARE_PROVIDER_SITE_OTHER): Payer: Medicaid Other | Admitting: Clinical

## 2021-09-03 DIAGNOSIS — F411 Generalized anxiety disorder: Secondary | ICD-10-CM | POA: Diagnosis not present

## 2021-09-03 NOTE — Patient Instructions (Signed)
[facility] [address] [place], [place] [address] [phone] (main office) [phone] (Luara Faye's office)       BRAINSTORMING  Develop a Plan Goals: Provide a way to start conversation about your new life with a baby Assist parents in recognizing and using resources within their reach Help pave the way before birth for an easier period of transition afterwards.  Make a list of the following information to keep in a central location: Full name of Mom and Partner: _____________________________________________ Baby's full name and Date of Birth: ___________________________________________ Home Address: ___________________________________________________________ ________________________________________________________________________ Home Phone: ____________________________________________________________ Parents' cell numbers: _____________________________________________________ ________________________________________________________________________ Name and contact info for OB: ______________________________________________ Name and contact info for Pediatrician:________________________________________ Contact info for Lactation Consultants: ________________________________________  REST and SLEEP *You each need at least 4-5 hours of uninterrupted sleep every day. Write specific names and contact information.* How are you going to rest in the postpartum period? While partner's home? When partner returns to work? When you both return to work? Where will your baby sleep? Who is available to help during the day? Evening? Night? Who could move in for a period to help support you? What are some ideas to help you get enough  sleep? __________________________________________________________________________________________________________________________________________________________________________________________________________________________________________ NUTRITIOUS FOOD AND DRINK *Plan for meals before your baby is born so you can have healthy food to eat during the immediate postpartum period.* Who will look after breakfast? Lunch? Dinner? List names and contact information. Brainstorm quick, healthy ideas for each meal. What can you do before baby is born to prepare meals for the postpartum period? How can others help you with meals? Which grocery stores provide online shopping and delivery? Which restaurants offer take-out or delivery options? ______________________________________________________________________________________________________________________________________________________________________________________________________________________________________________________________________________________________________________________________________________________________________________________________________  CARE FOR MOM *It's important that mom is cared for and pampered in the postpartum period. Remember, the most important ways new mothers need care are: sleep, nutrition, gentle exercise, and time off.* Who can come take care of mom during this period? Make a list of people with their contact information. List some activities that make you feel cared for, rested, and energized? Who can make sure you have opportunities to do these things? Does mom have a space of her very own within your home that's just for her? Make a "Mama Cave" where she can be comfortable, rest, and renew herself  daily. ______________________________________________________________________________________________________________________________________________________________________________________________________________________________________________________________________________________________________________________________________________________________________________________________________    CARE FOR AND FEEDING BABY *Knowledgeable and encouraging people will offer the best support with regard to feeding your baby.* Educate yourself and choose the best feeding option for your baby. Make a list of people who will guide, support, and be a resource for you as your care for and feed your baby. (Friends that have breastfed or are currently breastfeeding, lactation consultants, breastfeeding support groups, etc.) Consider a postpartum doula. (These websites can give you information: dona.org & padanc.org) Seek out local breastfeeding resources like the breastfeeding support group at [facility] or La Leche League. ______________________________________________________________________________________________________________________________________________________________________________________________________________________________________________________________________________________________________________________________________________________________________________________________________  CHORES AND ERRANDS Who can help with a thorough cleaning before baby is born? Make a list of people who will help with housekeeping and chores, like laundry, light cleaning, dishes, bathrooms, etc. Who can run some errands for you? What can you do to make sure you are stocked with basic supplies before baby is born? Who is going to do the  shopping? ______________________________________________________________________________________________________________________________________________________________________________________________________________________________________________________________________________________________________________________________________________________________________________________________________     Family Adjustment *Nurture yourselves.it helps parents be more loving and allows for better bonding with their child.* What sorts of things do you and   and partner enjoy doing together? Which activities help you to connect and strengthen your relationship? Make a list of those things. Make a list of people whom you trust to care for your baby so you can have some time together as a couple. What types of things help partner feel connected to Mom? Make a list. What needs will partner have in order to bond with baby? Other children? Who will care for them when you go into labor and while you are in the hospital? Think about what the needs of your older children might be. Who can help you meet those needs? In what ways are you helping them prepare for bringing baby home? List some specific strategies you have for family adjustment. _______________________________________________________________________________________________________________________________________________________________________________________________________________________________________________________________________________________________________________________________________________  SUPPORT *Someone who can empathize with experiences normalizes your problems and makes them more bearable.* Make a list of other friends, neighbors, and/or co-workers you know with infants (and small children, if applicable) with whom you can connect. Make a list of local or online support groups, mom groups, etc. in which you can be  involved. ______________________________________________________________________________________________________________________________________________________________________________________________________________________________________________________________________________________________________________________________________________________________________________________________________  Childcare Plans Investigate and plan for childcare if mom is returning to work. Talk about mom's concerns about her transition back to work. Talk about partner's concerns regarding this transition.  Mental Health *Your mental health is one of the highest priorities for a pregnant or postpartum mom.* 1 in 5 women experience anxiety and/or depression from the time of conception through the first year after birth. Postpartum Mood Disorders are the #1 complication of pregnancy and childbirth and the suffering experienced by these mothers is not necessary! These illnesses are temporary and respond well to treatment, which often includes self-care, social support, talk therapy, and medication when needed. Women experiencing anxiety and depression often say things like: "I'm supposed to be happy.why do I feel so sad?", "Why can't I snap out of it?", "I'm having thoughts that scare me." There is no need to be embarrassed if you are feeling these symptoms: Overwhelmed, anxious, angry, sad, guilty, irritable, hopeless, exhausted but can't sleep You are NOT alone. You are NOT to blame. With help, you WILL be well. Where can I find help? Medical professionals such as your OB, midwife, gynecologist, family practitioner, primary care provider, pediatrician, or mental health providers; [facility] support groups: Feelings After Birth, Breastfeeding Support Group, Baby and Me Group, and Fit 4 Two exercise classes. You have permission to ask for help. It will confirm your feelings, validate your experiences,  share/learn coping strategies, and gain support and encouragement as you heal. You are important! BRAINSTORM Make a list of local resources, including resources for mom and for partner. Identify support groups. Identify people to call late at night - include names and contact info. Talk with partner about perinatal mood and anxiety disorders. Talk with your OB, midwife, and doula about baby blues and about perinatal mood and anxiety disorders. Talk with your pediatrician about perinatal mood and anxiety disorders.   Support & Sanity Savers   What do you really need?  Basics In preparing for a new baby, many expectant parents spend hours shopping for baby clothes, decorating the nursery, and deciding which car seat to buy. Yet most don't think much about what the reality of parenting a newborn will be like, and what they need to make it through that. So, here is the advice of experienced parents. We know you'll read this, and think "they're exaggerating, I don't really need that." Just trust [place] on these, OK? Plan  for all of this, and if it turns out you don't need it, come back and teach [place] how you did it!  Must-Haves (Once baby's survival needs are met, make sure you attend to your own survival needs!) Sleep An average newborn sleeps 16-18 hours per day, over 6-7 sleep periods, rarely more than three hours at a time. It is normal and healthy for a newborn to wake throughout the night... but really hard on parents!! Naps. Prioritize sleep above any responsibilities like: cleaning house, visiting friends, running errands, etc.  Sleep whenever baby sleeps. If you can't nap, at least have restful times when baby eats. The more rest you get, the more patient you will be, the more emotionally stable, and better at solving problems.  Food You may not have realized it would be difficult to eat when you have a newborn. Yet, when we talk to countless new parents, they say things like "it may be 2:00 pm  when I realize I haven't had breakfast yet." Or "every time we sit down to dinner, baby needs to eat, and my food gets cold, so I don't bother to eat it." Finger food. Before your baby is born, stock up with one months' worth of food that: 1) you can eat with one hand while holding a baby, 2) doesn't need to be prepped, 3) is good hot or cold, 4) doesn't spoil when left out for a few hours, and 5) you like to eat. Think about: nuts, dried fruit, Clif bars, pretzels, jerky, gogurt, baby carrots, apples, bananas, crackers, cheez-n-crackers, string cheese, hot pockets or frozen burritos to microwave, garden burgers and breakfast pastries to put in the toaster, yogurt drinks, etc. Restaurant Menus. Make lists of your favorite restaurants & menu items. When family/friends want to help, you can give specific information without much thought. They can either bring you the food or send gift cards for just the right meals. Freezer Meals.  Take some time to make a few meals to put in the freezer ahead of time.  Easy to freeze meals can be anything such as soup, lasagna, chicken pie, or spaghetti sauce. Set up a Meal Schedule.  Ask friends and family to sign up to bring you meals during the first few weeks of being home. (It can be passed around at baby showers!) You have no idea how helpful this will be until you are in the throes of parenting.  https://hamilton-woodard.com/ is a great website to check out. Emotional Support Know who to call when you're stressed out. Parenting a newborn is very challenging work. There are times when it totally overwhelms your normal coping abilities. EVERY NEW PARENT NEEDS TO HAVE A PLAN FOR WHO TO CALL WHEN THEY JUST CAN'T COPE ANY MORE. (And it has to be someone other than the baby's other parent!) Before your baby is born, come up with at least one person you can call for support - write their phone number down and post it on the refrigerator. Anxiety & Sadness. Baby blues are normal after  pregnancy; however, there are more severe types of anxiety & sadness which can occur and should not be ignored.  They are always treatable, but you have to take the first step by reaching out for help. [facility] offers a "Mom Talk" group which meets every Tuesday from 10 am - 11 am.  This group is for new moms who need support and connection after their babies are born.  Call [phone].  Really, Really Helpful (  Plan for them! Make sure these happen often!!) Physical Support with Taking Care of Yourselves Asking friends and family. Before your baby is born, set up a schedule of people who can come and visit and help out (or ask a friend to schedule for you). Any time someone says "let me know what I can do to help," sign them up for a day. When they get there, their job is not to take care of the baby (that's your job and your joy). Their job is to take care of you!  Postpartum doulas. If you don't have anyone you can call on for support, look into postpartum doulas:  professionals at helping parents with caring for baby, caring for themselves, getting breastfeeding started, and helping with household tasks. www.padanc.org is a helpful website for learning about doulas in our area. Peer Support / Parent Groups Why: One of the greatest ideas for new parents is to be around other new parents. Parent groups give you a chance to share and listen to others who are going through the same season of life, get a sense of what is normal infant development by watching several babies learn and grow, share your stories of triumph and struggles with empathetic ears, and forgive your own mistakes when you realize all parents are learning by trial and error. Where to find: There are many places you can meet other new parents throughout our community.  [facility] offers the following classes for new moms and their little ones:  Baby and Me (Birth to Pioneer) and Breastfeeding Support Group. Go to  www.conehealthybaby.com or call ([phone] for more information. Time for your Relationship It's easy to get so caught up in meeting baby's immediate needs that it's hard to find time to connect with your partner, and meet the needs of your relationship. It's also easy to forget what "quality time with your partner" actually looks like. If you take your baby on a date, you'd be amazed how much of your couple time is spent feeding the baby, diapering the baby, admiring the baby, and talking about the baby. Dating: Try to take time for just the two of you. Babysitter tip: Sometimes when moms are breastfeeding a newborn, they find it hard to figure out how to schedule outings around baby's unpredictable feeding schedules. Have the babysitter come for a three hour period. When she comes over, if baby has just eaten, you can leave right away, and come back in two hours. If baby hasn't fed recently, you start the date at home. Once baby gets hungry and gets a good feeding in, you can head out for the rest of your date time. Date Nights at Home: If you can't get out, at least set aside one evening a week to prioritize your relationship: whenever baby dozes off or doesn't have any immediate needs, spend a little time focusing on each other. Potential conflicts: The main relationship conflicts that come up for new parents are: issues related to sexuality, financial stresses, a feeling of an unfair division of household tasks, and conflicts in parenting styles. The more you can work on these issues before baby arrives, the better!  Fun and Frills (Don't forget these. and don't feel guilty for indulging in them!) Everyone has something in life that is a fun little treat that they do just for themselves. It may be: reading the morning paper, or going for a daily jog, or having coffee with a friend once a week, or going to a movie  Friday nights, or fine chocolates, or bubble baths, or curling up with a good  book. Unless you do fun things for yourself every now and then, it's hard to have the energy for fun with your baby. Whatever your "special" treats are, make sure you find a way to continue to indulge in them after your baby is born. These special moments can recharge you, and allow you to return to baby with a new joy   PERINATAL MOOD DISORDERS: MATERNAL MENTAL HEALTH FROM CONCEPTION THROUGH THE POSTPARTUM PERIOD   Emergency and Crisis Resources:  If you are an imminent risk to self or others, are experiencing intense personal distress, and/or have noticed significant changes in activities of daily living, call:  [facility]: [phone] Mobile Crisis: 877-626-1772 National Suicide Hotline: 1-800-273-8255 Or visit the following crisis centers: Local Emergency Departments [facility]: [address], [place] [phone]. Hours: 8:30AM-5PM. Insurance Accepted: Medicaid, Medicare, and Uninsured.  [facility]  [address], [place] Mon-Friday 8am-3pm  [phone]                                                                                    Non-Crisis Resources: To identify specific providers that are covered by your insurance, contact your insurance company or local agencies: [place]: 1-800-256-2452 [facility]: 888-581-9988 [employer]: 1-800-939-5911 Postpartum Support International- [place] 1-800-944-4773                                                      Outpatient therapy and medication management providers:  [facility] [phone] Hours: 9AM-5PM  Insurance Accepted: AARP, Aetna, BCBS, Cigna, [employer], Humana, Medicare  [facility] [phone] Hours: 8AM-5PM  nsurance Accepted: All insurances EXCEPT AARP, Aetna, [employer], and Humana [facility]: [phone]             Hours: 8AM-8PM Insurance  Accepted: Aetna, BCBS, Cigna, [employer], Medicaid, Medicare, Uninsured [facility]: [phone] [facility]: [phone] Hours: 8:30AM-7PM Insurance Accepted: Aetna, BCBS, Medicaid, Medicare, Tricare, United Healthcare [facility]:  [phone]              Hours:9AM-5PM Insurance Accepted:  Aetna, BCBS, [facility], Medicaid, United Health Care  [facility] [phone] Hours: 9AM-5:30PM Insurance Accepted: AARP, Aetna, BCBS, Cigna, and Medicaid, Medicare, United Health Care [facility]:  [phone] Hours: 9am-5pm Insurance Accepted: BCBS; they do not accept Medicaid/Medicare [facility]: [phone] Hours: 9am-9pm Insurance Accepted: All major insurance including Medicaid and Medicare [facility]: [phone] Hours: 9AM-5:30PM Insurance Accepted: All insurances EXCEPT Medicaid and Medicare. [facility]: [phone]                                                                         Parenting Support Groups [facility]: [phone] [facility]:  [phone] Family Support Network (support for children in the NICU and/or with special needs), [phone]                                                                   Mental Health Support Groups Mental Health Association: [phone]                                                                                     Online Resources: Postpartum Support International: http://www.postpartum.net/  800-944-4PPD 2Moms Supporting Moms:  www.momssupportingmoms.net    

## 2021-09-03 NOTE — Telephone Encounter (Signed)
Call placed to [place] [facility] to give PA information for budesonide 106mcg/act inhaler. Spoke to several people at [facility]. Unable to complete PA due to hung up on x 2.  Will try again Judeth Cornfield, [place]

## 2021-09-04 NOTE — BH Specialist Note (Deleted)
Integrated Behavioral Health via Telemedicine Visit  09/04/2021 Elisse Pennick Letson [id number]  Number of Integrated Behavioral Health visits: *** Session Start time: 1:15***  Session End time: 1:45*** Total time: {IBH Total Time:21014050}  Referring Provider: *** Patient/Family location: Home*** [facility]: [facility]  All persons participating in visit: Patient *** and [facility] Seaman ***  Types of Service: {CHL AMB TYPE OF SERVICE:2407435096}  I connected with Merlene Laughter Deyarmin and/or Cala Bradford R Dufault's {family members:20773} via  Telephone or Engineer, civil (consulting)  (Video is Caregility application) and verified that I am speaking with the correct person using two identifiers. Discussed confidentiality: {YES/NO:21197}  I discussed the limitations of telemedicine and the availability of in person appointments.  Discussed there is a possibility of technology failure and discussed alternative modes of communication if that failure occurs.  I discussed that engaging in this telemedicine visit, they consent to the provision of behavioral healthcare and the services will be billed under their insurance.  Patient and/or legal guardian expressed understanding and consented to Telemedicine visit: {YES/NO:21197}  Presenting Concerns: Patient and/or family reports the following symptoms/concerns: *** Duration of problem: ***; Severity of problem: {Mild/Moderate/Severe:20260}  Patient and/or Family's Strengths/Protective Factors: {CHL AMB BH PROTECTIVE FACTORS:5163311020}  Goals Addressed: Patient will:  Reduce symptoms of: {IBH Symptoms:21014056}   Increase knowledge and/or ability of: {IBH Patient Tools:21014057}   Demonstrate ability to: {IBH Goals:21014053}  Progress towards Goals: {CHL AMB BH PROGRESS TOWARDS GOALS:[phone]}  Interventions: Interventions utilized:  {IBH  Interventions:21014054} Standardized Assessments completed: {IBH Screening Tools:21014051}  Patient and/or Family Response: ***  Assessment: Patient currently experiencing ***.   Patient may benefit from ***.  Plan: Follow up with behavioral health clinician on : *** Behavioral recommendations: *** Referral(s): {IBH Referrals:21014055}  I discussed the assessment and treatment plan with the patient and/or parent/guardian. They were provided an opportunity to ask questions and all were answered. They agreed with the plan and demonstrated an understanding of the instructions.   They were advised to call back or seek an in-person evaluation if the symptoms worsen or if the condition fails to improve as anticipated.  Valetta Close Aharon Carriere, LCSW

## 2021-09-04 NOTE — Telephone Encounter (Signed)
Pt was seen by Asher Muir, [facility] on 09/03/21. Pt having panic attacks and that is what is causing her to want to get Pulmicort Rx refilled. Pt advised to seek other ways to decrease anxiety. Asher Muir, [facility] advised pt. Pt verbalized understanding.   Judeth Cornfield, RN

## 2021-09-05 ENCOUNTER — Ambulatory Visit: Payer: Medicaid Other | Attending: Obstetrics and Gynecology

## 2021-09-05 ENCOUNTER — Encounter: Payer: Self-pay | Admitting: *Deleted

## 2021-09-05 ENCOUNTER — Other Ambulatory Visit: Payer: Self-pay

## 2021-09-05 ENCOUNTER — Ambulatory Visit: Payer: Medicaid Other | Admitting: *Deleted

## 2021-09-05 VITALS — BP 114/69 | HR 95

## 2021-09-05 DIAGNOSIS — Z6841 Body Mass Index (BMI) 40.0 and over, adult: Secondary | ICD-10-CM | POA: Diagnosis present

## 2021-09-05 DIAGNOSIS — O0990 Supervision of high risk pregnancy, unspecified, unspecified trimester: Secondary | ICD-10-CM | POA: Insufficient documentation

## 2021-09-05 DIAGNOSIS — Z3A00 Weeks of gestation of pregnancy not specified: Secondary | ICD-10-CM | POA: Insufficient documentation

## 2021-09-05 DIAGNOSIS — F419 Anxiety disorder, unspecified: Secondary | ICD-10-CM | POA: Insufficient documentation

## 2021-09-05 DIAGNOSIS — Z3A35 35 weeks gestation of pregnancy: Secondary | ICD-10-CM | POA: Insufficient documentation

## 2021-09-05 DIAGNOSIS — O99343 Other mental disorders complicating pregnancy, third trimester: Secondary | ICD-10-CM | POA: Insufficient documentation

## 2021-09-05 DIAGNOSIS — O99012 Anemia complicating pregnancy, second trimester: Secondary | ICD-10-CM | POA: Diagnosis not present

## 2021-09-05 DIAGNOSIS — O99213 Obesity complicating pregnancy, third trimester: Secondary | ICD-10-CM | POA: Diagnosis not present

## 2021-09-05 DIAGNOSIS — O34219 Maternal care for unspecified type scar from previous cesarean delivery: Secondary | ICD-10-CM | POA: Insufficient documentation

## 2021-09-05 DIAGNOSIS — E669 Obesity, unspecified: Secondary | ICD-10-CM | POA: Diagnosis not present

## 2021-09-05 DIAGNOSIS — J45909 Unspecified asthma, uncomplicated: Secondary | ICD-10-CM | POA: Insufficient documentation

## 2021-09-05 DIAGNOSIS — O99013 Anemia complicating pregnancy, third trimester: Secondary | ICD-10-CM | POA: Diagnosis not present

## 2021-09-05 DIAGNOSIS — O99519 Diseases of the respiratory system complicating pregnancy, unspecified trimester: Secondary | ICD-10-CM | POA: Insufficient documentation

## 2021-09-05 DIAGNOSIS — Z3689 Encounter for other specified antenatal screening: Secondary | ICD-10-CM | POA: Insufficient documentation

## 2021-09-05 DIAGNOSIS — O99212 Obesity complicating pregnancy, second trimester: Secondary | ICD-10-CM

## 2021-09-11 ENCOUNTER — Ambulatory Visit (INDEPENDENT_AMBULATORY_CARE_PROVIDER_SITE_OTHER): Payer: Medicaid Other | Admitting: Obstetrics and Gynecology

## 2021-09-11 ENCOUNTER — Other Ambulatory Visit (HOSPITAL_COMMUNITY)
Admission: RE | Admit: 2021-09-11 | Discharge: 2021-09-11 | Disposition: A | Discharge status: Home / Self Care | Payer: Medicaid Other | Source: Ambulatory Visit | Attending: Obstetrics and Gynecology | Admitting: Obstetrics and Gynecology

## 2021-09-11 ENCOUNTER — Other Ambulatory Visit: Payer: Self-pay

## 2021-09-11 VITALS — BP 111/78 | HR 90 | Wt 273.6 lb

## 2021-09-11 DIAGNOSIS — Z98891 History of uterine scar from previous surgery: Secondary | ICD-10-CM

## 2021-09-11 DIAGNOSIS — D649 Anemia, unspecified: Secondary | ICD-10-CM | POA: Diagnosis not present

## 2021-09-11 DIAGNOSIS — Z3A36 36 weeks gestation of pregnancy: Secondary | ICD-10-CM

## 2021-09-11 DIAGNOSIS — O0990 Supervision of high risk pregnancy, unspecified, unspecified trimester: Secondary | ICD-10-CM | POA: Diagnosis not present

## 2021-09-11 DIAGNOSIS — M549 Dorsalgia, unspecified: Secondary | ICD-10-CM

## 2021-09-11 DIAGNOSIS — Z6841 Body Mass Index (BMI) 40.0 and over, adult: Secondary | ICD-10-CM

## 2021-09-11 DIAGNOSIS — O99519 Diseases of the respiratory system complicating pregnancy, unspecified trimester: Secondary | ICD-10-CM

## 2021-09-11 DIAGNOSIS — J45909 Unspecified asthma, uncomplicated: Secondary | ICD-10-CM

## 2021-09-11 LAB — OB RESULTS CONSOLE GC/CHLAMYDIA: Gonorrhea: NEGATIVE

## 2021-09-11 MED ORDER — FLUTICASONE PROPIONATE HFA 44 MCG/ACT IN AERO: 2.0000 | INHALATION_SPRAY | Freq: Two times a day (BID) | RESPIRATORY_TRACT | 12 refills | Status: DC

## 2021-09-11 NOTE — Progress Notes (Signed)
   PRENATAL VISIT NOTE  Subjective:  Veronica [name] is a 30 y.o. G2P1001 at [redacted]w[redacted]d being seen today for ongoing prenatal care.  She is currently monitored for the following issues for this high-risk pregnancy and has BMI 50.0-59.9, adult (HCC); Anemia; GERD (gastroesophageal reflux disease); GAD (generalized anxiety disorder); Subclinical hypothyroidism; Obesity complicating pregnancy; Epigastric pain; Hiatal hernia; Back pain; Asthma; Supervision of high risk pregnancy, antepartum; History of cesarean delivery; Syncope; Asthma complicating pregnancy, antepartum; Morbid obesity (HCC); and [redacted] weeks gestation of pregnancy on their problem list.  Patient doing well with no acute concerns today. She reports  occasional shortness of breath, back pain and anxiety .  Contractions: Not present. Vag. Bleeding: None.  Movement: Present. Denies leaking of fluid.   The following portions of the patient's history were reviewed and updated as appropriate: allergies, current medications, past family history, past medical history, past social history, past surgical history and problem list. Problem list updated.  Objective:   Vitals:   09/11/21 1451  BP: 111/78  Pulse: 90  Weight: 273 lb 9.6 oz (124.1 kg)    Fetal Status: Fetal Heart Rate (bpm): 130 Fundal Height: 38 cm Movement: Present     General:  Alert, oriented and cooperative. Patient is in no acute distress.  Skin: Skin is warm and dry. No rash noted.   Cardiovascular: Normal heart rate noted  Respiratory: Normal respiratory effort, no problems with respiration noted  Abdomen: Soft, gravid, appropriate for gestational age.  Pain/Pressure: Present     Pelvic: Cervical exam performed Dilation: Closed Effacement (%): Thick Station: Ballotable  Extremities: Normal range of motion.  Edema: None  Mental Status:  Normal mood and affect. Normal behavior. Normal judgment and thought content.   Assessment and Plan:  Pregnancy: G2P1001 at [redacted]w[redacted]d  1. [redacted]  weeks gestation of pregnancy   2. Acute bilateral back pain, unspecified back location Continue tylenol and flexeril prn  3. Supervision of high risk pregnancy, antepartum Continue routine care  - GC/Chlamydia probe amp ([place])not at [facility] - Culture, beta strep (group b only)  4. History of cesarean delivery Pt desires TOLAC  5. Morbid obesity (HCC)   6. BMI 50.0-59.9, adult (HCC)   7. Asthma complicating pregnancy, antepartum D/c budesomide , start with flovent, pharmacist contacted - fluticasone (FLOVENT HFA) 44 MCG/ACT inhaler; Inhale 2 puffs into the lungs in the morning and at bedtime.  Dispense: 1 each; Refill: 12  8. Anemia, unspecified type  - CBC  Preterm labor symptoms and general obstetric precautions including but not limited to vaginal bleeding, contractions, leaking of fluid and fetal movement were reviewed in detail with the patient.  Please refer to After Visit Summary for other counseling recommendations.   Return in about 1 week (around 09/18/2021) for [facility], in person.   Mariel Aloe, MD Faculty Attending Center for [facility]

## 2021-09-12 ENCOUNTER — Ambulatory Visit: Payer: Medicaid Other | Attending: Obstetrics and Gynecology

## 2021-09-12 ENCOUNTER — Encounter: Payer: Self-pay | Admitting: *Deleted

## 2021-09-12 ENCOUNTER — Ambulatory Visit: Payer: Medicaid Other | Admitting: *Deleted

## 2021-09-12 VITALS — BP 115/59 | HR 81

## 2021-09-12 DIAGNOSIS — O99012 Anemia complicating pregnancy, second trimester: Secondary | ICD-10-CM | POA: Diagnosis not present

## 2021-09-12 DIAGNOSIS — O0990 Supervision of high risk pregnancy, unspecified, unspecified trimester: Secondary | ICD-10-CM

## 2021-09-12 DIAGNOSIS — O99519 Diseases of the respiratory system complicating pregnancy, unspecified trimester: Secondary | ICD-10-CM

## 2021-09-12 DIAGNOSIS — O99212 Obesity complicating pregnancy, second trimester: Secondary | ICD-10-CM | POA: Insufficient documentation

## 2021-09-12 DIAGNOSIS — F419 Anxiety disorder, unspecified: Secondary | ICD-10-CM | POA: Insufficient documentation

## 2021-09-12 DIAGNOSIS — D649 Anemia, unspecified: Secondary | ICD-10-CM | POA: Diagnosis not present

## 2021-09-12 DIAGNOSIS — E669 Obesity, unspecified: Secondary | ICD-10-CM

## 2021-09-12 DIAGNOSIS — Z3689 Encounter for other specified antenatal screening: Secondary | ICD-10-CM | POA: Insufficient documentation

## 2021-09-12 DIAGNOSIS — Z3A36 36 weeks gestation of pregnancy: Secondary | ICD-10-CM | POA: Diagnosis not present

## 2021-09-12 DIAGNOSIS — J45909 Unspecified asthma, uncomplicated: Secondary | ICD-10-CM

## 2021-09-12 DIAGNOSIS — O34219 Maternal care for unspecified type scar from previous cesarean delivery: Secondary | ICD-10-CM | POA: Diagnosis not present

## 2021-09-12 DIAGNOSIS — Z6841 Body Mass Index (BMI) 40.0 and over, adult: Secondary | ICD-10-CM | POA: Diagnosis present

## 2021-09-12 DIAGNOSIS — O99342 Other mental disorders complicating pregnancy, second trimester: Secondary | ICD-10-CM | POA: Insufficient documentation

## 2021-09-12 LAB — GC/CHLAMYDIA PROBE AMP (~~LOC~~) NOT AT ARMC
Chlamydia: NEGATIVE
Comment: NEGATIVE
Comment: NORMAL
Neisseria Gonorrhea: NEGATIVE

## 2021-09-12 LAB — CBC
Hematocrit: 33.4 % — ABNORMAL LOW (ref 34.0–46.6)
Hemoglobin: 11.2 g/dL (ref 11.1–15.9)
MCH: 26 pg — ABNORMAL LOW (ref 26.6–33.0)
MCHC: 33.5 g/dL (ref 31.5–35.7)
MCV: 78 fL — ABNORMAL LOW (ref 79–97)
Platelets: 350 10*3/uL (ref 150–450)
RBC: 4.31 x10E6/uL (ref 3.77–5.28)
RDW: 14.5 % (ref 11.7–15.4)
WBC: 9.9 10*3/uL (ref 3.4–10.8)

## 2021-09-12 NOTE — BH Specialist Note (Signed)
Integrated Behavioral Health via Telemedicine Visit  09/12/2021 Veronica [name] [id number]  Number of Integrated Behavioral Health visits: 3 Session Start time: 1:20  Session End time: 2:02 Total time:  42  Referring Provider: Merian Capron, MD Patient/Family location: Home [facility] Provider location: Center for [facility] at [facility]  All persons participating in visit: Patient Veronica [name] and [facility] Khyron Garno   Types of Service: Individual psychotherapy and Video visit  I connected with Merlene Laughter Abel and/or Merlene Laughter Apgar's  n/a  via  Telephone or Video Enabled Telemedicine Application  (Video is Caregility application) and verified that I am speaking with the correct person using two identifiers. Discussed confidentiality: Yes   I discussed the limitations of telemedicine and the availability of in person appointments.  Discussed there is a possibility of technology failure and discussed alternative modes of communication if that failure occurs.  I discussed that engaging in this telemedicine visit, they consent to the provision of behavioral healthcare and the services will be billed under their insurance.  Patient and/or legal guardian expressed understanding and consented to Telemedicine visit: Yes   Presenting Concerns: Patient and/or family reports the following symptoms/concerns: Nervous/anxious over upcoming childbirth and "digestive issues, like something sticking in my throat"; breathing exercises and three walks/day are helping to manage anxiety.  Duration of problem: increase as pregnancy progresses; Severity of problem:  moderately severe  Patient and/or Family's Strengths/Protective Factors: Social connections, Concrete supports in place (healthy food, safe environments, etc.), Sense of purpose, and Physical Health (exercise, healthy diet, medication compliance, etc.)  Goals Addressed: Patient will:  Reduce symptoms of:  anxiety and stress   Increase knowledge and/or ability of: stress reduction   Demonstrate ability to: Increase healthy adjustment to current life circumstances and Increase motivation to adhere to plan of care  Progress towards Goals: Ongoing  Interventions: Interventions utilized:  Solution-Focused Strategies and Supportive Reflection Standardized Assessments completed:  PHQ9/GAD7 given in past two weeks  Patient and/or Family Response: Pt taking BH medication and implementing daily self-coping strategies effectively; agrees to ongoing treatment plan  Assessment: Patient currently experiencing Generalized anxiety disorder.   Patient may benefit from continued psychoeducation and brief therapeutic interventions regarding coping with symptoms of anxiety .  Plan: Follow up with behavioral health clinician on : Two weeks; Call [name] at [phone], as needed. Behavioral recommendations:  -Continue taking Zoloft, Vistaril and prenatal vitamin as prescribed -Continue using daily self-coping strategies to manage feelings of anxiety (breathing exercises, thrice daily walks, etc.) -Discuss pain relief options for childbirth at upcoming medical appointment with medical provider -Consider registering for and attending childbirth education class of choice at [email or web address]  Referral(s): Integrated [employer] (In Clinic)  I discussed the assessment and treatment plan with the patient and/or parent/guardian. They were provided an opportunity to ask questions and all were answered. They agreed with the plan and demonstrated an understanding of the instructions.   They were advised to call back or seek an in-person evaluation if the symptoms worsen or if the condition fails to improve as anticipated.  [name], LCSW  Depression screen [facility] 2/9 08/28/2021 08/13/2021 07/10/2021 06/13/2021 05/15/2021  Decreased Interest 1 1 1 1 1   Down, Depressed, Hopeless 2 1 1 1 1   PHQ -  2 Score 3 2 2 2 2   Altered sleeping 2 1 1 1 1   Tired, decreased energy 1 1 1 1 1   Change in appetite 0 1 1 1 1   Feeling bad  or failure about yourself  0 0 0 0 0  Trouble concentrating 0 0 0 0 0  Moving slowly or fidgety/restless 0 0 0 0 0  Suicidal thoughts 0 0 0 0 0  PHQ-9 Score 6 5 5 5 5   Difficult doing work/chores - - - - Not difficult at all  Some recent data might be hidden   GAD 7 : Generalized Anxiety Score 08/28/2021 08/13/2021 07/10/2021 06/13/2021  Nervous, Anxious, on Edge 3 2 2 2   Control/stop worrying 3 3 2 2   Worry too much - different things 3 3 2 2   Trouble relaxing 1 1 1 1   Restless 1 1 0 1  Easily annoyed or irritable 1 1 3 3   Afraid - awful might happen 2 2 0 2  Total GAD 7 Score 14 13 10 13   Anxiety Difficulty - - - -

## 2021-09-13 ENCOUNTER — Encounter: Payer: Self-pay | Admitting: Radiology

## 2021-09-14 ENCOUNTER — Telehealth: Payer: Self-pay | Admitting: Radiology

## 2021-09-14 LAB — CULTURE, BETA STREP (GROUP B ONLY): Strep Gp B Culture: POSITIVE — AB

## 2021-09-14 NOTE — Telephone Encounter (Signed)
Spoke with patient regarding paperwork submitted for completion for [employer] for clarification on information needed. Will inform patient of completion of paperwork once completed. Patient is aware of this plan.

## 2021-09-17 ENCOUNTER — Ambulatory Visit (INDEPENDENT_AMBULATORY_CARE_PROVIDER_SITE_OTHER): Payer: Medicaid Other | Admitting: Clinical

## 2021-09-17 DIAGNOSIS — F411 Generalized anxiety disorder: Secondary | ICD-10-CM

## 2021-09-17 NOTE — Patient Instructions (Addendum)
Center for [facility] Healthcare at [facility] [address] [place], [place] [address] [phone] (main office) [phone] [facility] office)  [email or web address] (register for childbirth education and/or other classes of interest)

## 2021-09-18 NOTE — BH Specialist Note (Signed)
Integrated Behavioral Health via Telemedicine Visit  09/18/2021 [name] [id number]  Number of Integrated Behavioral Health visits: 4 Session Start time: 1:15  Session End time: 1:45 Total time: 30  Referring Provider: Merian Capron, MD Patient/Family location: Home [facility] Provider location: Center for [facility] at [facility] for Women  All persons participating in visit: Patient Veronica [name] and [facility] Alexis Mizuno   Types of Service: Individual psychotherapy and Telephone visit  I connected with [name] and/or [name]'s  n/a  via  Telephone or Video Enabled Telemedicine Application  (Video is Caregility application) and verified that I am speaking with the correct person using two identifiers. Discussed confidentiality: Yes   I discussed the limitations of telemedicine and the availability of in person appointments.  Discussed there is a possibility of technology failure and discussed alternative modes of communication if that failure occurs.  I discussed that engaging in this telemedicine visit, they consent to the provision of behavioral healthcare and the services will be billed under their insurance.  Patient and/or legal guardian expressed understanding and consented to Telemedicine visit: Yes   Presenting Concerns: Patient and/or family reports the following symptoms/concerns: Pt feeling both excited and anxious about upcoming birth; wants to make sure she has option of extra anxiety medication available if she has to have cesarean; FOB, friend and sister will all be supports postpartum.  Duration of problem: Current pregnancy increase; Severity of problem: moderate  Patient and/or Family's Strengths/Protective Factors: Social connections, Concrete supports in place (healthy food, safe environments, etc.), Sense of purpose, and Physical Health (exercise, healthy diet, medication compliance, etc.)  Goals Addressed: Patient  will:  Reduce symptoms of: anxiety   Demonstrate ability to: Increase healthy adjustment to current life circumstances and Increase adequate support systems for patient/family  Progress towards Goals: Ongoing  Interventions: Interventions utilized:  Solution-Focused Strategies Standardized Assessments completed: Not Needed  Patient and/or Family Response: Pt agrees with treatment plan  Assessment: Patient currently experiencing Generalized anxiety disorder.   Patient may benefit from continued psychoeducation and brief therapeutic interventions regarding coping with symptoms of anxiety .  Plan: Follow up with behavioral health clinician on : About 2 weeks postpartum; Call Aizlyn Schifano at [phone], as needed. Behavioral recommendations:  -Continue taking BH medication as prescribed -Continue taking daily walks in pregnancy; resume walks postpartum after medical provider says you may start walks again -Continue with plan to prepare space at home for baby's arrival Referral(s): Integrated [employer] (In Clinic)  I discussed the assessment and treatment plan with the patient and/or parent/guardian. They were provided an opportunity to ask questions and all were answered. They agreed with the plan and demonstrated an understanding of the instructions.   They were advised to call back or seek an in-person evaluation if the symptoms worsen or if the condition fails to improve as anticipated.  [name], LCSW  Depression screen [facility] 2/9 09/23/2021 08/28/2021 08/13/2021 07/10/2021 06/13/2021  Decreased Interest 1 1 1 1 1   Down, Depressed, Hopeless 1 2 1 1 1   PHQ - 2 Score 2 3 2 2 2   Altered sleeping 1 2 1 1 1   Tired, decreased energy 2 1 1 1 1   Change in appetite 0 0 1 1 1   Feeling bad or failure about yourself  0 0 0 0 0  Trouble concentrating 0 0 0 0 0  Moving slowly or fidgety/restless 0 0 0 0 0  Suicidal thoughts 0 0 0 0 0  PHQ-9 Score 5 6 5 5 5   Difficult doing  work/chores - - - - -  Some recent data might be hidden   GAD 7 : Generalized Anxiety Score 09/23/2021 08/28/2021 08/13/2021 07/10/2021  Nervous, Anxious, on Edge 1 3 2 2   Control/stop worrying 1 3 3 2   Worry too much - different things 1 3 3 2   Trouble relaxing 1 1 1 1   Restless 1 1 1  0  Easily annoyed or irritable 2 1 1 3   Afraid - awful might happen 1 2 2  0  Total GAD 7 Score 8 14 13 10   Anxiety Difficulty - - - -

## 2021-09-19 ENCOUNTER — Encounter: Payer: Self-pay | Admitting: *Deleted

## 2021-09-19 ENCOUNTER — Ambulatory Visit: Payer: Medicaid Other | Attending: Obstetrics and Gynecology

## 2021-09-19 ENCOUNTER — Ambulatory Visit: Payer: Medicaid Other | Admitting: *Deleted

## 2021-09-19 ENCOUNTER — Encounter: Payer: Self-pay | Admitting: Radiology

## 2021-09-19 ENCOUNTER — Other Ambulatory Visit: Payer: Self-pay

## 2021-09-19 VITALS — BP 104/69 | HR 94

## 2021-09-19 DIAGNOSIS — J45909 Unspecified asthma, uncomplicated: Secondary | ICD-10-CM | POA: Diagnosis not present

## 2021-09-19 DIAGNOSIS — O0990 Supervision of high risk pregnancy, unspecified, unspecified trimester: Secondary | ICD-10-CM | POA: Diagnosis present

## 2021-09-19 DIAGNOSIS — O99513 Diseases of the respiratory system complicating pregnancy, third trimester: Secondary | ICD-10-CM | POA: Diagnosis not present

## 2021-09-19 DIAGNOSIS — O34219 Maternal care for unspecified type scar from previous cesarean delivery: Secondary | ICD-10-CM | POA: Insufficient documentation

## 2021-09-19 DIAGNOSIS — Z6841 Body Mass Index (BMI) 40.0 and over, adult: Secondary | ICD-10-CM | POA: Diagnosis present

## 2021-09-19 DIAGNOSIS — F419 Anxiety disorder, unspecified: Secondary | ICD-10-CM | POA: Diagnosis not present

## 2021-09-19 DIAGNOSIS — O0993 Supervision of high risk pregnancy, unspecified, third trimester: Secondary | ICD-10-CM | POA: Insufficient documentation

## 2021-09-19 DIAGNOSIS — Z3A37 37 weeks gestation of pregnancy: Secondary | ICD-10-CM | POA: Diagnosis not present

## 2021-09-19 DIAGNOSIS — O99012 Anemia complicating pregnancy, second trimester: Secondary | ICD-10-CM | POA: Insufficient documentation

## 2021-09-19 DIAGNOSIS — Z3689 Encounter for other specified antenatal screening: Secondary | ICD-10-CM | POA: Diagnosis not present

## 2021-09-19 DIAGNOSIS — O99519 Diseases of the respiratory system complicating pregnancy, unspecified trimester: Secondary | ICD-10-CM | POA: Diagnosis present

## 2021-09-19 DIAGNOSIS — O99213 Obesity complicating pregnancy, third trimester: Secondary | ICD-10-CM | POA: Insufficient documentation

## 2021-09-19 DIAGNOSIS — O99013 Anemia complicating pregnancy, third trimester: Secondary | ICD-10-CM | POA: Diagnosis not present

## 2021-09-19 DIAGNOSIS — O99343 Other mental disorders complicating pregnancy, third trimester: Secondary | ICD-10-CM | POA: Diagnosis not present

## 2021-09-21 ENCOUNTER — Inpatient Hospital Stay (HOSPITAL_COMMUNITY)
Admission: AD | Admit: 2021-09-21 | Discharge: 2021-09-21 | Disposition: A | Discharge status: Home / Self Care | Payer: Medicaid Other | Attending: Family Medicine | Admitting: Family Medicine

## 2021-09-21 ENCOUNTER — Other Ambulatory Visit: Payer: Self-pay

## 2021-09-21 ENCOUNTER — Inpatient Hospital Stay (HOSPITAL_COMMUNITY): Payer: Medicaid Other

## 2021-09-21 ENCOUNTER — Encounter (HOSPITAL_COMMUNITY): Payer: Self-pay | Admitting: Family Medicine

## 2021-09-21 DIAGNOSIS — O99513 Diseases of the respiratory system complicating pregnancy, third trimester: Secondary | ICD-10-CM | POA: Insufficient documentation

## 2021-09-21 DIAGNOSIS — O26893 Other specified pregnancy related conditions, third trimester: Secondary | ICD-10-CM | POA: Insufficient documentation

## 2021-09-21 DIAGNOSIS — R519 Headache, unspecified: Secondary | ICD-10-CM | POA: Diagnosis not present

## 2021-09-21 DIAGNOSIS — R0602 Shortness of breath: Secondary | ICD-10-CM | POA: Diagnosis not present

## 2021-09-21 DIAGNOSIS — Z885 Allergy status to narcotic agent status: Secondary | ICD-10-CM | POA: Insufficient documentation

## 2021-09-21 DIAGNOSIS — J4541 Moderate persistent asthma with (acute) exacerbation: Secondary | ICD-10-CM | POA: Diagnosis not present

## 2021-09-21 DIAGNOSIS — Z98891 History of uterine scar from previous surgery: Secondary | ICD-10-CM | POA: Insufficient documentation

## 2021-09-21 DIAGNOSIS — Z3A38 38 weeks gestation of pregnancy: Secondary | ICD-10-CM | POA: Insufficient documentation

## 2021-09-21 DIAGNOSIS — E669 Obesity, unspecified: Secondary | ICD-10-CM | POA: Insufficient documentation

## 2021-09-21 DIAGNOSIS — O99213 Obesity complicating pregnancy, third trimester: Secondary | ICD-10-CM | POA: Insufficient documentation

## 2021-09-21 DIAGNOSIS — J45909 Unspecified asthma, uncomplicated: Secondary | ICD-10-CM

## 2021-09-21 DIAGNOSIS — Z8349 Family history of other endocrine, nutritional and metabolic diseases: Secondary | ICD-10-CM | POA: Diagnosis not present

## 2021-09-21 DIAGNOSIS — Z886 Allergy status to analgesic agent status: Secondary | ICD-10-CM | POA: Insufficient documentation

## 2021-09-21 DIAGNOSIS — O0990 Supervision of high risk pregnancy, unspecified, unspecified trimester: Secondary | ICD-10-CM

## 2021-09-21 MED ORDER — ACETAMINOPHEN 500 MG PO TABS
1000.0000 mg | ORAL_TABLET | Freq: Once | ORAL | Status: AC
  Administered 2021-09-21: 1000 mg via ORAL
  Filled 2021-09-21: qty 2

## 2021-09-21 MED ORDER — PREDNISONE 20 MG PO TABS: 40.0000 mg | ORAL_TABLET | Freq: Every day | ORAL | 0 refills | Status: AC

## 2021-09-21 MED ORDER — PREDNISONE 20 MG PO TABS
40.0000 mg | ORAL_TABLET | Freq: Once | ORAL | Status: AC
  Administered 2021-09-21: 40 mg via ORAL
  Filled 2021-09-21: qty 2

## 2021-09-21 MED ORDER — ALBUTEROL SULFATE (2.5 MG/3ML) 0.083% IN NEBU
2.5000 mg | INHALATION_SOLUTION | Freq: Once | RESPIRATORY_TRACT | Status: AC
  Administered 2021-09-21: 2.5 mg via RESPIRATORY_TRACT
  Filled 2021-09-21: qty 3

## 2021-09-21 MED ORDER — METHYLPREDNISOLONE SODIUM SUCC 125 MG IJ SOLR
125.0000 mg | Freq: Once | INTRAMUSCULAR | Status: DC
  Filled 2021-09-21: qty 2

## 2021-09-21 NOTE — MAU Provider Note (Signed)
Chief Complaint:  Headache and Shortness of Breath   Event Date/Time   First Provider Initiated Contact with Patient 09/21/21 1911      HPI: Veronica [name] is a 30 y.o. G2P1001 at 32w0dby LMP who presents to maternity admissions reporting onset of headache with shortness of breath today. She has hx of asthma and used her Flovent inhaler this morning and albuterol x 1 today at noon. She reports good fetal movement, denies LOF, vaginal bleeding, vaginal itching/burning, urinary symptoms, h/a, dizziness, n/v, or fever/chills.     Location: headache Quality: dull Severity: 5/10 on pain scale Duration: 1 day Timing: constant Modifying factors: none Associated signs and symptoms: none  HPI  Past Medical History: Past Medical History:  Diagnosis Date   Abdominal pain 01/06/2012   Acute tension headache 07/27/2019   Anemia    Ankle pain    Asthma    Birth control counseling 08/26/2011   BMI 40.0-44.9, adult ([place] 02/27/2011   GERD (gastroesophageal reflux disease)    Hiatal hernia    Obesity (BMI 30-39.9)    Secondary amenorrhea 03/25/2018    Past obstetric history: OB History  Gravida Para Term Preterm AB Living  2 1 1     1   SAB IAB Ectopic Multiple Live Births          1    # Outcome Date GA Lbr Len/2nd Weight Sex Delivery Anes PTL Lv  2 Current           1 Term 09/02/10 [id number] g M CS-LTranv Spinal  LIV    Past Surgical History: Past Surgical History:  Procedure Laterality Date   CESAREAN SECTION  09/02/2010   For macrosomia, but son was 7 lb 12 oz   TONSILLECTOMY AND ADENOIDECTOMY      Family History: Family History  Problem Relation Age of Onset   Diabetes Mother    Hypertension Mother    Obesity Mother    Heart attack Mother [id number]     Death   Obesity Sister    Osteochondroma Sister    Diabetes Sister    Hypertension Sister     Social History: Social History   Tobacco Use   Smoking status: Never   Smokeless tobacco: Never  Vaping Use   Vaping  Use: Never used  Substance Use Topics   Alcohol use: No   Drug use: Not Currently    Comment: last used in teens    Allergies:  Allergies  Allergen Reactions   Morphine And Related Other (See Comments)    Causes "chest to be heavy"   Motrin [Ibuprofen]     Was told to not take this because it would flare stomach issues    Meds:  Medications Prior to Admission  Medication Sig Dispense Refill Last Dose   albuterol (VENTOLIN HFA) 108 (90 Base) MCG/ACT inhaler INHALE 2 PUFFS BY MOUTH EVERY 4 HOURS AS NEEDED FOR WHEEZING OR SHORTNESS OF BREATH (COUGH). 18 g 0 09/21/2021 at 1200   cetirizine (ZYRTEC) 5 MG tablet Take 5 mg by mouth daily.   09/21/2021   docusate sodium (COLACE) 100 MG capsule Take 1 capsule (100 mg total) by mouth 2 (two) times daily. 60 capsule 1 Past Week   ferrous sulfate 325 (65 FE) MG tablet Take 1 tablet (325 mg total) by mouth every other day. 30 tablet 1 09/21/2021   hydrOXYzine (ATARAX/VISTARIL) 10 MG tablet Take 1 tablet (10 mg total) by mouth 3 (three) times daily  as needed for anxiety. 30 tablet 2 Past Week   omeprazole (PRILOSEC) 20 MG capsule Take 1 capsule (20 mg total) by mouth in the morning and at bedtime. 60 capsule 3 09/21/2021   polyethylene glycol (MIRALAX / GLYCOLAX) 17 g packet Take 17 g by mouth daily. 14 each 1 Past Week   Prenatal Vit-Fe Fumarate-FA (PRENATAL COMPLETE) 14-0.4 MG TABS Take 1 tablet by mouth daily. 60 tablet 3 09/21/2021   sertraline (ZOLOFT) 50 MG tablet Take 1 tablet (50 mg total) by mouth daily. 30 tablet 5 09/20/2021   Blood Pressure Monitoring (BLOOD PRESSURE KIT) DEVI 1 Device by Does not apply route once a week. 1 each 0    cyclobenzaprine (FLEXERIL) 10 MG tablet Take 1 tablet (10 mg total) by mouth every 8 (eight) hours as needed for muscle spasms. (Patient not taking: Reported on 09/11/2021) 30 tablet 1    fluticasone (FLOVENT HFA) 44 MCG/ACT inhaler Inhale 2 puffs into the lungs in the morning and at bedtime. (Patient not  taking: Reported on 09/19/2021) 1 each 12     ROS:  Review of Systems  Constitutional:  Negative for chills, fatigue and fever.  Eyes:  Negative for visual disturbance.  Respiratory:  Positive for chest tightness and shortness of breath.   Cardiovascular:  Negative for chest pain.  Gastrointestinal:  Negative for abdominal pain, nausea and vomiting.  Genitourinary:  Negative for difficulty urinating, dysuria, flank pain, pelvic pain, vaginal bleeding, vaginal discharge and vaginal pain.  Neurological:  Positive for headaches. Negative for dizziness.  Psychiatric/Behavioral: Negative.      I have reviewed patient's Past Medical Hx, Surgical Hx, Family Hx, Social Hx, medications and allergies.   Physical Exam  Patient Vitals for the past 24 hrs:  BP Temp Temp src Pulse Resp SpO2  09/21/21 1910 -- -- -- -- -- 98 %  09/21/21 1905 -- -- -- -- -- 98 %  09/21/21 1903 130/75 -- -- 93 -- 98 %  09/21/21 1900 -- -- -- -- -- 98 %  09/21/21 1852 131/78 98.7 F (37.1 C) Oral (!) 103 19 100 %   Constitutional: Well-developed, well-nourished female in no acute distress.  Cardiovascular: normal rate Respiratory: normal effort GI: Abd soft, non-tender, gravid appropriate for gestational age.  MS: Extremities nontender, no edema, normal ROM Neurologic: Alert and oriented x 4.  GU: Neg CVAT.  PELVIC EXAM: Cervix pink, visually closed, without lesion, scant white creamy discharge, vaginal walls and external genitalia normal Bimanual exam: Cervix 0/long/high, firm, anterior, neg CMT, uterus nontender, nonenlarged, adnexa without tenderness, enlargement, or mass     FHT:  Baseline 135 , moderate variability, accelerations present, no decelerations Contractions: irregular, mild to palpation   Labs: No results found for this or any previous visit (from the past 24 hour(s)). AB/Positive/-- (03/01 1159)  Imaging:   MAU Course/MDM: Orders Placed This Encounter  Procedures   DG Chest Portable  1 View   Urinalysis, Routine w reflex microscopic Urine, Clean Catch   ED EKG   Discharge patient    Meds ordered this encounter  Medications   acetaminophen (TYLENOL) tablet 1,000 mg   albuterol (PROVENTIL) (2.5 MG/3ML) 0.083% nebulizer solution 2.5 mg   DISCONTD: methylPREDNISolone sodium succinate (SOLU-MEDROL) 125 mg/2 mL injection 125 mg   predniSONE (DELTASONE) tablet 40 mg   predniSONE (DELTASONE) 20 MG tablet    Sig: Take 2 tablets (40 mg total) by mouth daily for 5 days.    Dispense:  10 tablet  Refill:  0    Order Specific Question:   Supervising Provider    Answer:   Donnamae Jude [[id number]]     NST reviewed and reactive Pt h/a improved with Tylenol given in MAU. BP wnl, pt has OB appt on 09/23/21. CXR, EKG wnl, lung sounds/heart sounds wnl Likely asthma exacerbation with SOB improved with albuterol nebulizer treatment Consult Dr Elonda Husky with presentation, exam findings and test results.  Give Prednisone burst, first dose in MAU, pt f/u as scheduled Precautions/reasons to return to MAU reviewed     Assessment: 1. Moderate persistent asthma with exacerbation   2. Supervision of high risk pregnancy, antepartum   3. Asthma complicating pregnancy, antepartum   4. [redacted] weeks gestation of pregnancy   5. Pregnancy headache in third trimester     Plan: Discharge home Labor precautions and fetal kick counts  Follow-up [place] [facility] [place] at [facility] Women Follow up.   Specialty: Obstetrics and Gynecology Why: As scheduled. Return to MAU as needed for emergencies. Contact information: [address] [place] [address] [phone]               Allergies as of 09/21/2021       Reactions   Morphine And Related Other (See Comments)   Causes "chest to be heavy"   Motrin [ibuprofen]    Was told to not take this because it would flare stomach issues        Medication List     TAKE these  medications    albuterol 108 (90 Base) MCG/ACT inhaler Commonly known as: VENTOLIN HFA INHALE 2 PUFFS BY MOUTH EVERY 4 HOURS AS NEEDED FOR WHEEZING OR SHORTNESS OF BREATH (COUGH).   Blood Pressure Kit Devi 1 Device by Does not apply route once a week.   cetirizine 5 MG tablet Commonly known as: ZYRTEC Take 5 mg by mouth daily.   cyclobenzaprine 10 MG tablet Commonly known as: FLEXERIL Take 1 tablet (10 mg total) by mouth every 8 (eight) hours as needed for muscle spasms.   docusate sodium 100 MG capsule Commonly known as: COLACE Take 1 capsule (100 mg total) by mouth 2 (two) times daily.   ferrous sulfate 325 (65 FE) MG tablet Take 1 tablet (325 mg total) by mouth every other day.   fluticasone 44 MCG/ACT inhaler Commonly known as: Flovent HFA Inhale 2 puffs into the lungs in the morning and at bedtime.   hydrOXYzine 10 MG tablet Commonly known as: ATARAX/VISTARIL Take 1 tablet (10 mg total) by mouth 3 (three) times daily as needed for anxiety.   omeprazole 20 MG capsule Commonly known as: PRILOSEC Take 1 capsule (20 mg total) by mouth in the morning and at bedtime.   polyethylene glycol 17 g packet Commonly known as: MIRALAX / GLYCOLAX Take 17 g by mouth daily.   predniSONE 20 MG tablet Commonly known as: DELTASONE Take 2 tablets (40 mg total) by mouth daily for 5 days.   Prenatal Complete 14-0.4 MG Tabs Take 1 tablet by mouth daily.   sertraline 50 MG tablet Commonly known as: ZOLOFT Take 1 tablet (50 mg total) by mouth daily.        Fatima Blank Certified Nurse-Midwife 09/21/2021 9:35 PM

## 2021-09-21 NOTE — Discharge Instructions (Signed)
Reasons to return to MAU at [facility]:  1.  Contractions are  5 minutes apart or less, each last 1 minute, these have been going on for 1-2 hours, and you cannot walk or talk during them 2.  You have a large gush of fluid, or a trickle of fluid that will not stop and you have to wear a pad 3.  You have bleeding that is bright red, heavier than spotting--like menstrual bleeding (spotting can be normal in early labor or after a check of your cervix) 4.  You do not feel the baby moving like he/she normally does  

## 2021-09-21 NOTE — MAU Note (Signed)
...  Veronica [name] is a 30 y.o. at [redacted]w[redacted]d here in MAU reporting: HA since 1030 this morning when she woke up and rates it a 5/10. She states around noon she started feeling SOB while she was lying down so she sat up on the side of the bed and started "deep breathing" to feel better and used her albuterol inhaler. She has not used her inhaler since then. She has not taken anything for her HA. +FM. Denies VB or LOF.    FHT: 140 initial external Lab orders placed from triage:  UA

## 2021-09-23 ENCOUNTER — Other Ambulatory Visit: Payer: Self-pay | Admitting: *Deleted

## 2021-09-23 ENCOUNTER — Other Ambulatory Visit: Payer: Self-pay

## 2021-09-23 ENCOUNTER — Ambulatory Visit (INDEPENDENT_AMBULATORY_CARE_PROVIDER_SITE_OTHER): Payer: Medicaid Other | Admitting: Obstetrics and Gynecology

## 2021-09-23 VITALS — BP 128/80 | HR 83 | Wt 272.9 lb

## 2021-09-23 DIAGNOSIS — Z98891 History of uterine scar from previous surgery: Secondary | ICD-10-CM

## 2021-09-23 DIAGNOSIS — Z6841 Body Mass Index (BMI) 40.0 and over, adult: Secondary | ICD-10-CM

## 2021-09-23 DIAGNOSIS — O0990 Supervision of high risk pregnancy, unspecified, unspecified trimester: Secondary | ICD-10-CM

## 2021-09-23 NOTE — Progress Notes (Signed)
   PRENATAL VISIT NOTE  Subjective:  Veronica [name] is a 30 y.o. G2P1001 at [redacted]w[redacted]d being seen today for ongoing prenatal care.  She is currently monitored for the following issues for this high-risk pregnancy and has BMI 50.0-59.9, adult (HCC); Anemia; GERD (gastroesophageal reflux disease); GAD (generalized anxiety disorder); Subclinical hypothyroidism; Obesity complicating pregnancy; Epigastric pain; Hiatal hernia; Back pain; Asthma; Supervision of high risk pregnancy, antepartum; History of cesarean delivery; Syncope; Asthma complicating pregnancy, antepartum; Morbid obesity (HCC); and [redacted] weeks gestation of pregnancy on their problem list.  Patient doing well with no acute concerns today. She reports  occasional shortness of breath .  Contractions: Not present. Vag. Bleeding: None.  Movement: Present. Denies leaking of fluid.   The following portions of the patient's history were reviewed and updated as appropriate: allergies, current medications, past family history, past medical history, past social history, past surgical history and problem list. Problem list updated.  Objective:   Vitals:   09/23/21 0822  BP: 128/80  Pulse: 83  Weight: 272 lb 14.4 oz (123.8 kg)    Fetal Status: Fetal Heart Rate (bpm): 138 Fundal Height: 39 cm Movement: Present     General:  Alert, oriented and cooperative. Patient is in no acute distress.  Skin: Skin is warm and dry. No rash noted.   Cardiovascular: Normal heart rate noted  Respiratory: Normal respiratory effort, no problems with respiration noted  Abdomen: Soft, gravid, appropriate for gestational age.  Pain/Pressure: Present     Pelvic: Cervical exam performed Dilation: Closed Effacement (%): Thick Station: Ballotable  Extremities: Normal range of motion.  Edema: Trace  Mental Status:  Normal mood and affect. Normal behavior. Normal judgment and thought content.   Assessment and Plan:  Pregnancy: G2P1001 at [redacted]w[redacted]d  1. Supervision of high  risk pregnancy, antepartum Continue routine care  2. History of cesarean delivery Cervix is extremely unfavorable, extremely thick and cervical os not well palpated. Station is high.  Discussed possible increased risk of prolonged labor or failed induction.   Unsure if foley bulb could be placed easily and prostaglandins are not an option.  Pt advised to discuss with spouse and family whether to proceed with VBAC or convert to repeat c section  3. Morbid obesity (HCC)   4. BMI 50.0-59.9, adult [facility])   Term labor symptoms and general obstetric precautions including but not limited to vaginal bleeding, contractions, leaking of fluid and fetal movement were reviewed in detail with the patient.  Please refer to After Visit Summary for other counseling recommendations.   Return in about 1 week (around 09/30/2021) for [facility], in person.   Mariel Aloe, MD Faculty Attending [facility]

## 2021-09-25 ENCOUNTER — Other Ambulatory Visit: Payer: Self-pay

## 2021-09-25 ENCOUNTER — Ambulatory Visit: Payer: Medicaid Other | Attending: Obstetrics

## 2021-09-25 ENCOUNTER — Ambulatory Visit: Payer: Medicaid Other | Admitting: *Deleted

## 2021-09-25 VITALS — BP 103/60 | HR 80

## 2021-09-25 DIAGNOSIS — O99519 Diseases of the respiratory system complicating pregnancy, unspecified trimester: Secondary | ICD-10-CM | POA: Diagnosis present

## 2021-09-25 DIAGNOSIS — J45909 Unspecified asthma, uncomplicated: Secondary | ICD-10-CM

## 2021-09-25 DIAGNOSIS — Z6841 Body Mass Index (BMI) 40.0 and over, adult: Secondary | ICD-10-CM | POA: Diagnosis present

## 2021-09-25 DIAGNOSIS — O99513 Diseases of the respiratory system complicating pregnancy, third trimester: Secondary | ICD-10-CM | POA: Insufficient documentation

## 2021-09-25 DIAGNOSIS — O0990 Supervision of high risk pregnancy, unspecified, unspecified trimester: Secondary | ICD-10-CM

## 2021-09-25 DIAGNOSIS — O0993 Supervision of high risk pregnancy, unspecified, third trimester: Secondary | ICD-10-CM | POA: Diagnosis not present

## 2021-09-25 DIAGNOSIS — O34219 Maternal care for unspecified type scar from previous cesarean delivery: Secondary | ICD-10-CM | POA: Insufficient documentation

## 2021-09-25 DIAGNOSIS — Z3A38 38 weeks gestation of pregnancy: Secondary | ICD-10-CM | POA: Diagnosis not present

## 2021-09-25 DIAGNOSIS — E669 Obesity, unspecified: Secondary | ICD-10-CM

## 2021-09-25 DIAGNOSIS — O99213 Obesity complicating pregnancy, third trimester: Secondary | ICD-10-CM | POA: Diagnosis not present

## 2021-09-27 ENCOUNTER — Inpatient Hospital Stay (HOSPITAL_COMMUNITY)
Admission: AD | Admit: 2021-09-27 | Discharge: 2021-09-27 | Disposition: A | Discharge status: Home / Self Care | Payer: Medicaid Other | Attending: Obstetrics and Gynecology | Admitting: Obstetrics and Gynecology

## 2021-09-27 ENCOUNTER — Encounter (HOSPITAL_COMMUNITY): Payer: Self-pay | Admitting: Obstetrics and Gynecology

## 2021-09-27 ENCOUNTER — Other Ambulatory Visit: Payer: Self-pay

## 2021-09-27 DIAGNOSIS — Z3A38 38 weeks gestation of pregnancy: Secondary | ICD-10-CM | POA: Diagnosis not present

## 2021-09-27 DIAGNOSIS — O471 False labor at or after 37 completed weeks of gestation: Secondary | ICD-10-CM | POA: Insufficient documentation

## 2021-09-27 NOTE — Discharge Instructions (Signed)

## 2021-09-27 NOTE — MAU Provider Note (Signed)
  S: Ms. Kasandra Fehr Bibian is a 30 y.o. G2P1001 at [redacted]w[redacted]d  who presents to MAU today complaining with concerns she is in labor. She reports she didn't have contractions with her last baby and didn't know she was in labor. She denies vaginal bleeding. She denies LOF. She reports normal fetal movement.    O: BP 115/74   Pulse 78   Temp 99 F (37.2 C) (Oral)   Resp 18   Ht 5\' 2"  (1.575 m)   Wt 123.7 kg   LMP 12/29/2020 (Exact Date)   BMI 49.88 kg/m  GENERAL: Well-developed, well-nourished female in no acute distress.  HEAD: Normocephalic, atraumatic.  CHEST: Normal effort of breathing, regular heart rate ABDOMEN: Soft, nontender, gravid  Cervical exam:  Dilation: Fingertip Exam by:: n druebbisch rn   Fetal Monitoring: Baseline: 120 Variability: moderate Accelerations: 15x15 Decelerations: none Contractions: occasional uc's   A: SIUP at [redacted]w[redacted]d  False labor  P: -Discharge home in stable condition -Labor precautions discussed -Patient advised to follow-up with OB as scheduled for prenatal care -Patient may return to MAU as needed or if her condition were to change or worsen   [redacted]w[redacted]d, Rolm Bookbinder 09/27/2021 8:33 AM

## 2021-09-27 NOTE — MAU Note (Signed)
PT SAYS HAS LOWER ABD CRAMPS- STRONG- STARTED 0433 TODAY [facility] - CLINIC VE ON Monday- CLOSED  GBS- UNSURE DENIES HSV

## 2021-10-01 ENCOUNTER — Ambulatory Visit (INDEPENDENT_AMBULATORY_CARE_PROVIDER_SITE_OTHER): Payer: Medicaid Other | Admitting: Clinical

## 2021-10-01 DIAGNOSIS — O99340 Other mental disorders complicating pregnancy, unspecified trimester: Secondary | ICD-10-CM | POA: Diagnosis not present

## 2021-10-01 DIAGNOSIS — F411 Generalized anxiety disorder: Secondary | ICD-10-CM | POA: Diagnosis not present

## 2021-10-01 DIAGNOSIS — Z3A00 Weeks of gestation of pregnancy not specified: Secondary | ICD-10-CM

## 2021-10-01 NOTE — Patient Instructions (Signed)
Center for [facility] at [facility] [place], [place] [address] [phone] (main office) [phone] [facility] office)  [email or web address]      BRAINSTORMING  Develop a Plan Goals: Provide a way to start conversation about your new life with a baby Assist parents in recognizing and using resources within their reach Help pave the way before birth for an easier period of transition afterwards.  Make a list of the following information to keep in a central location: Full name of Mom and Partner: _____________________________________________ 45 full name and Date of Birth: ___________________________________________ Home Address: ___________________________________________________________ ________________________________________________________________________ Home Phone: ____________________________________________________________ Parents' cell numbers: _____________________________________________________ ________________________________________________________________________ Name and contact info for OB: ______________________________________________ Name and contact info for Pediatrician:________________________________________ Contact info for Lactation Consultants: ________________________________________  REST and SLEEP *You each need at least 4-5 hours of uninterrupted sleep every day. Write specific names and contact information.* How are you going to rest in the postpartum period? While partner's home? When partner returns to work? When you both return to work? Where will your baby sleep? Who is available to help during the day? Evening? Night? Who could move in for a period to help support you? What are some ideas to help you get enough  sleep? __________________________________________________________________________________________________________________________________________________________________________________________________________________________________________ NUTRITIOUS FOOD AND DRINK *Plan for meals before your baby is born so you can have healthy food to eat during the immediate postpartum period.* Who will look after breakfast? Lunch? Dinner? List names and contact information. Brainstorm quick, healthy ideas for each meal. What can you do before baby is born to prepare meals for the postpartum period? How can others help you with meals? Which grocery stores provide online shopping and delivery? Which restaurants offer take-out or delivery options? ______________________________________________________________________________________________________________________________________________________________________________________________________________________________________________________________________________________________________________________________________________________________________________________________________  CARE FOR MOM *It's important that mom is cared for and pampered in the postpartum period. Remember, the most important ways new mothers need care are: sleep, nutrition, gentle exercise, and time off.* Who can come take care of mom during this period? Make a list of people with their contact information. List some activities that make you feel cared for, rested, and energized? Who can make sure you have opportunities to do these things? Does mom have a space of her very own within your home that's just for her? Make a "[facility]" where she can be comfortable, rest, and renew herself  daily. ______________________________________________________________________________________________________________________________________________________________________________________________________________________________________________________________________________________________________________________________________________________________________________________________________    CARE FOR AND FEEDING BABY *Knowledgeable and encouraging people will offer the best support with regard to feeding your baby.* Educate yourself and choose the best feeding option for your baby. Make a list of people who will guide, support, and be a resource for you as your care for and feed your baby. (Friends that have breastfed or are currently breastfeeding, lactation consultants, breastfeeding support groups, etc.) Consider a postpartum doula. (These websites can give you information: dona.org & BuyingShow.es) Seek out local breastfeeding resources like the breastfeeding support group at [employer] or [employer]. ______________________________________________________________________________________________________________________________________________________________________________________________________________________________________________________________________________________________________________________________________________________________________________________________________  Verner Chol AND ERRANDS Who can help with a thorough cleaning before baby is born? Make a list of people who will help with housekeeping and chores, like laundry, light cleaning, dishes, bathrooms, etc. Who can run some errands for you? What can you do to make sure you are stocked with basic supplies before baby is born? Who is going to do the  shopping? ______________________________________________________________________________________________________________________________________________________________________________________________________________________________________________________________________________________________________________________________________________________________________________________________________     Family Adjustment *Nurture yourselves.it helps parents be more loving and allows for better bonding with their child.* What sorts of things do you  and partner enjoy doing together? Which activities help you to connect and strengthen your relationship? Make a list of those things. Make a list of people whom you trust to care for your baby so you can have some time together as a couple. What types of things help partner feel connected to Mom? Make a list. What needs will partner have in order to bond with baby? Other children? Who will care for them when you go into labor and while you are in the hospital? Think about what the needs of your older children might be. Who can help you meet those needs? In what ways are you helping them prepare for bringing baby home? List some specific strategies you have for family adjustment. _______________________________________________________________________________________________________________________________________________________________________________________________________________________________________________________________________________________________________________________________________________  SUPPORT *Someone who can empathize with experiences normalizes your problems and makes them more bearable.* Make a list of other friends, neighbors, and/or co-workers you know with infants (and small children, if applicable) with whom you can connect. Make a list of local or online support groups, mom groups, etc. in which you can be  involved. ______________________________________________________________________________________________________________________________________________________________________________________________________________________________________________________________________________________________________________________________________________________________________________________________________  Childcare Plans Investigate and plan for childcare if mom is returning to work. Talk about mom's concerns about her transition back to work. Talk about partner's concerns regarding this transition.  Mental Health *Your mental health is one of the highest priorities for a pregnant or postpartum mom.* 1 in 5 women experience anxiety and/or depression from the time of conception through the first year after birth. Postpartum Mood Disorders are the #1 complication of pregnancy and childbirth and the suffering experienced by these mothers is not necessary! These illnesses are temporary and respond well to treatment, which often includes self-care, social support, talk therapy, and medication when needed. Women experiencing anxiety and depression often say things like: "I'm supposed to be happy.why do I feel so sad?", "Why can't I snap out of it?", "I'm having thoughts that scare me." There is no need to be embarrassed if you are feeling these symptoms: Overwhelmed, anxious, angry, sad, guilty, irritable, hopeless, exhausted but can't sleep You are NOT alone. You are NOT to blame. With help, you WILL be well. Where can I find help? Medical professionals such as your OB, midwife, gynecologist, family practitioner, primary care provider, pediatrician, or mental health providers; [facility] support groups: Feelings After Birth, Breastfeeding Support Group, Baby and Me Group, and Fit 4 Two exercise classes. You have permission to ask for help. It will confirm your feelings, validate your experiences,  share/learn coping strategies, and gain support and encouragement as you heal. You are important! BRAINSTORM Make a list of local resources, including resources for mom and for partner. Identify support groups. Identify people to call late at night - include names and contact info. Talk with partner about perinatal mood and anxiety disorders. Talk with your OB, midwife, and doula about baby blues and about perinatal mood and anxiety disorders. Talk with your pediatrician about perinatal mood and anxiety disorders.   Support & Sanity Savers   What do you really need?  Basics In preparing for a new baby, many expectant parents spend hours shopping for baby clothes, decorating the nursery, and deciding which car seat to buy. Yet most don't think much about what the reality of parenting a newborn will be like, and what they need to make it through that. So, here is the advice of experienced parents. We know you'll read this, and think "they're exaggerating, I don't really need that." Just trust [place] on these, OK? Plan  for all of this, and if it turns out you don't need it, come back and teach [place] how you did it!  Must-Haves (Once baby's survival needs are met, make sure you attend to your own survival needs!) Sleep An average newborn sleeps 16-18 hours per day, over 6-7 sleep periods, rarely more than three hours at a time. It is normal and healthy for a newborn to wake throughout the night... but really hard on parents!! Naps. Prioritize sleep above any responsibilities like: cleaning house, visiting friends, running errands, etc.  Sleep whenever baby sleeps. If you can't nap, at least have restful times when baby eats. The more rest you get, the more patient you will be, the more emotionally stable, and better at solving problems.  Food You may not have realized it would be difficult to eat when you have a newborn. Yet, when we talk to countless new parents, they say things like "it may be 2:00 pm  when I realize I haven't had breakfast yet." Or "every time we sit down to dinner, baby needs to eat, and my food gets cold, so I don't bother to eat it." Finger food. Before your baby is born, stock up with one months' worth of food that: 1) you can eat with one hand while holding a baby, 2) doesn't need to be prepped, 3) is good hot or cold, 4) doesn't spoil when left out for a few hours, and 5) you like to eat. Think about: nuts, dried fruit, Clif bars, pretzels, jerky, gogurt, baby carrots, apples, bananas, crackers, cheez-n-crackers, string cheese, hot pockets or frozen burritos to microwave, garden burgers and breakfast pastries to put in the toaster, yogurt drinks, etc. Restaurant Menus. Make lists of your favorite restaurants & menu items. When family/friends want to help, you can give specific information without much thought. They can either bring you the food or send gift cards for just the right meals. Freezer Meals.  Take some time to make a few meals to put in the freezer ahead of time.  Easy to freeze meals can be anything such as soup, lasagna, chicken pie, or spaghetti sauce. Set up a Meal Schedule.  Ask friends and family to sign up to bring you meals during the first few weeks of being home. (It can be passed around at baby showers!) You have no idea how helpful this will be until you are in the throes of parenting.  https://hamilton-woodard.com/ is a great website to check out. Emotional Support Know who to call when you're stressed out. Parenting a newborn is very challenging work. There are times when it totally overwhelms your normal coping abilities. EVERY NEW PARENT NEEDS TO HAVE A PLAN FOR WHO TO CALL WHEN THEY JUST CAN'T COPE ANY MORE. (And it has to be someone other than the baby's other parent!) Before your baby is born, come up with at least one person you can call for support - write their phone number down and post it on the refrigerator. Anxiety & Sadness. Baby blues are normal after  pregnancy; however, there are more severe types of anxiety & sadness which can occur and should not be ignored.  They are always treatable, but you have to take the first step by reaching out for help. [facility] offers a "Mom Talk" group which meets every Tuesday from 10 am - 11 am.  This group is for new moms who need support and connection after their babies are born.  Call [phone].  Really, Really Helpful (  Plan for them! Make sure these happen often!!) Physical Support with Taking Care of Yourselves Asking friends and family. Before your baby is born, set up a schedule of people who can come and visit and help out (or ask a friend to schedule for you). Any time someone says "let me know what I can do to help," sign them up for a day. When they get there, their job is not to take care of the baby (that's your job and your joy). Their job is to take care of you!  Postpartum doulas. If you don't have anyone you can call on for support, look into postpartum doulas:  professionals at helping parents with caring for baby, caring for themselves, getting breastfeeding started, and helping with household tasks. www.padanc.org is a helpful website for learning about doulas in our area. Peer Support / Parent Groups Why: One of the greatest ideas for new parents is to be around other new parents. Parent groups give you a chance to share and listen to others who are going through the same season of life, get a sense of what is normal infant development by watching several babies learn and grow, share your stories of triumph and struggles with empathetic ears, and forgive your own mistakes when you realize all parents are learning by trial and error. Where to find: There are many places you can meet other new parents throughout our community.  [facility] offers the following classes for new moms and their little ones:  Baby and Me (Birth to Pioneer) and Breastfeeding Support Group. Go to  www.conehealthybaby.com or call [phone] for more information. Time for your Relationship It's easy to get so caught up in meeting baby's immediate needs that it's hard to find time to connect with your partner, and meet the needs of your relationship. It's also easy to forget what "quality time with your partner" actually looks like. If you take your baby on a date, you'd be amazed how much of your couple time is spent feeding the baby, diapering the baby, admiring the baby, and talking about the baby. Dating: Try to take time for just the two of you. Babysitter tip: Sometimes when moms are breastfeeding a newborn, they find it hard to figure out how to schedule outings around baby's unpredictable feeding schedules. Have the babysitter come for a three hour period. When she comes over, if baby has just eaten, you can leave right away, and come back in two hours. If baby hasn't fed recently, you start the date at home. Once baby gets hungry and gets a good feeding in, you can head out for the rest of your date time. Date Nights at Home: If you can't get out, at least set aside one evening a week to prioritize your relationship: whenever baby dozes off or doesn't have any immediate needs, spend a little time focusing on each other. Potential conflicts: The main relationship conflicts that come up for new parents are: issues related to sexuality, financial stresses, a feeling of an unfair division of household tasks, and conflicts in parenting styles. The more you can work on these issues before baby arrives, the better!  Fun and Frills (Don't forget these. and don't feel guilty for indulging in them!) Everyone has something in life that is a fun little treat that they do just for themselves. It may be: reading the morning paper, or going for a daily jog, or having coffee with a friend once a week, or going to a movie  on Friday nights, or fine chocolates, or bubble baths, or curling up with a good  book. Unless you do fun things for yourself every now and then, it's hard to have the energy for fun with your baby. Whatever your "special" treats are, make sure you find a way to continue to indulge in them after your baby is born. These special moments can recharge you, and allow you to return to baby with a new joy   PERINATAL MOOD DISORDERS: [place]   _________________________________________Emergency and Crisis Resources If you are an imminent risk to self or others, are experiencing intense personal distress, and/or have noticed significant changes in activities of daily living, call:  [place]: [phone]  [address] [place], [place], [address] Mobile Crisis: [place]: 988 Or visit the following crisis centers: Local Emergency Departments [facility]: [address] [place]. Hours: 8:30AM-5PM. Insurance Accepted: Medicaid, Medicare, and Uninsured.  [facility]:  [address], [place]  Mon-Friday 8am-3pm, [phone]                                                                                  ___________ Non-Crisis Resources To identify specific providers that are covered by your insurance, contact your insurance company or local agencies:  [place]: [phone] [facility] and [employer]: [place]: [phone] Postpartum Support International- Warm-line: 223-180-1496                                                      __Outpatient Therapy and Medication Management   Providers:  [facility]: [phone] Hours: 9AM-5PM  Insurance Accepted: Alben Spittle, Shane Crutch, [place], [place] [facility]): [phone] Hours: 8AM-5:30PM  nsurance Accepted: All insurances EXCEPT AARP, [place],  [place], and [place]: [phone] Hours: 8AM-8PM Insurance Accepted: Cristal Ford, Freddrick March, [place], Medicare, [phone] [facility]: [phone] Hours: 8:30AM-7PM Insurance Accepted: Cristal Ford, Medicaid, Medicare, Tricare, [employer] Counseling:  [place] Accepted:  Holland Falling, Lorella Nimrod, [employer], [place]: [phone] Hours: 9AM-5:30PM Insurance Accepted: Alben Spittle, Charlotte Crumb, and Medicaid, Medicare, [facility]:  [phone] Hours: 9am-5pm Insurance Accepted: BCBS; they do not accept Medicaid/Medicare [facility] [place]: [phone] Hours: 9am-9pm Insurance Accepted: All major insurance including Medicaid and Medicare [facility]: [phone] Hours: [place] Accepted: All insurances EXCEPT Medicaid and Medicare. [facility]: [phone]   ____________  Parenting Support Groups [facility]: [phone] [facility]:  [phone] Family Support Network: (support for children in the NICU and/or with special needs), [phone]   ___________                                                                 Mental Health Support Groups Mental Health Association: [phone]    _____________                                                                                  Online Resources Postpartum Support International: http://www.postpartum.net/  800-944-4PPD 2Moms Supporting Moms:  www.momssupportingmoms.net    

## 2021-10-02 ENCOUNTER — Ambulatory Visit: Payer: Medicaid Other | Attending: Obstetrics

## 2021-10-02 ENCOUNTER — Encounter: Payer: Self-pay | Admitting: *Deleted

## 2021-10-02 ENCOUNTER — Other Ambulatory Visit: Payer: Self-pay

## 2021-10-02 ENCOUNTER — Ambulatory Visit (INDEPENDENT_AMBULATORY_CARE_PROVIDER_SITE_OTHER): Payer: Medicaid Other | Admitting: Obstetrics & Gynecology

## 2021-10-02 ENCOUNTER — Ambulatory Visit: Payer: Medicaid Other | Admitting: *Deleted

## 2021-10-02 ENCOUNTER — Other Ambulatory Visit: Payer: Self-pay | Admitting: Advanced Practice Midwife

## 2021-10-02 VITALS — BP 107/67 | HR 98

## 2021-10-02 DIAGNOSIS — E669 Obesity, unspecified: Secondary | ICD-10-CM

## 2021-10-02 DIAGNOSIS — Z3A39 39 weeks gestation of pregnancy: Secondary | ICD-10-CM | POA: Diagnosis not present

## 2021-10-02 DIAGNOSIS — Z6841 Body Mass Index (BMI) 40.0 and over, adult: Secondary | ICD-10-CM

## 2021-10-02 DIAGNOSIS — O99213 Obesity complicating pregnancy, third trimester: Secondary | ICD-10-CM | POA: Insufficient documentation

## 2021-10-02 DIAGNOSIS — O0990 Supervision of high risk pregnancy, unspecified, unspecified trimester: Secondary | ICD-10-CM

## 2021-10-02 DIAGNOSIS — Z98891 History of uterine scar from previous surgery: Secondary | ICD-10-CM

## 2021-10-02 DIAGNOSIS — O99519 Diseases of the respiratory system complicating pregnancy, unspecified trimester: Secondary | ICD-10-CM

## 2021-10-02 DIAGNOSIS — O34219 Maternal care for unspecified type scar from previous cesarean delivery: Secondary | ICD-10-CM

## 2021-10-02 NOTE — Progress Notes (Signed)
   PRENATAL VISIT NOTE  Subjective:  Veronica [name] is a 30 y.o. G2P1001 at [redacted]w[redacted]d being seen today for ongoing prenatal care.  She is currently monitored for the following issues for this high-risk pregnancy and has BMI 50.0-59.9, adult (HCC); Anemia; GERD (gastroesophageal reflux disease); GAD (generalized anxiety disorder); Subclinical hypothyroidism; Obesity complicating pregnancy; Epigastric pain; Hiatal hernia; Back pain; Asthma; Supervision of high risk pregnancy, antepartum; History of cesarean delivery; Syncope; Asthma complicating pregnancy, antepartum; and Morbid obesity (HCC) on their problem list.  Patient reports occasional contractions.  Contractions: Irritability. Vag. Bleeding: None.  Movement: Present. Denies leaking of fluid.   The following portions of the patient's history were reviewed and updated as appropriate: allergies, current medications, past family history, past medical history, past social history, past surgical history and problem list.   Objective:   Vitals:   10/02/21 1049  BP: 128/75  Pulse: 86  Weight: 276 lb 3.2 oz (125.3 kg)    Fetal Status: Fetal Heart Rate (bpm): 150   Movement: Present     General:  Alert, oriented and cooperative. Patient is in no acute distress.  Skin: Skin is warm and dry. No rash noted.   Cardiovascular: Normal heart rate noted  Respiratory: Normal respiratory effort, no problems with respiration noted  Abdomen: Soft, gravid, appropriate for gestational age.  Pain/Pressure: Present     Pelvic: Cervical exam deferred        Extremities: Normal range of motion.  Edema: Trace  Mental Status: Normal mood and affect. Normal behavior. Normal judgment and thought content.   Assessment and Plan:  Pregnancy: G2P1001 at [redacted]w[redacted]d 1. Morbid obesity (HCC) Body mass index is 50.52 kg/m. IOL 40 weeks  2. History of cesarean delivery Plans TOLAC, FB tomorrow  3. Supervision of high risk pregnancy, antepartum BPP today  Term labor  symptoms and general obstetric precautions including but not limited to vaginal bleeding, contractions, leaking of fluid and fetal movement were reviewed in detail with the patient. Please refer to After Visit Summary for other counseling recommendations.   Return in about 2 days (around 10/04/2021).  Future Appointments  Date Time Provider Department Center  10/02/2021  1:30 PM [facility] NURSE [facility]  10/02/2021  1:45 PM [facility] US4 [facility]  10/04/2021  7:15 AM [facility] SCHED ROOM [facility]IN[facility] None  10/18/2021 10:45 AM [facility]    Scheryl Darter, MD

## 2021-10-03 ENCOUNTER — Ambulatory Visit (INDEPENDENT_AMBULATORY_CARE_PROVIDER_SITE_OTHER): Payer: Medicaid Other | Admitting: Obstetrics & Gynecology

## 2021-10-03 DIAGNOSIS — Z3A39 39 weeks gestation of pregnancy: Secondary | ICD-10-CM

## 2021-10-03 DIAGNOSIS — O99213 Obesity complicating pregnancy, third trimester: Secondary | ICD-10-CM

## 2021-10-03 DIAGNOSIS — O0990 Supervision of high risk pregnancy, unspecified, unspecified trimester: Secondary | ICD-10-CM

## 2021-10-03 DIAGNOSIS — Z98891 History of uterine scar from previous surgery: Secondary | ICD-10-CM

## 2021-10-03 NOTE — Progress Notes (Signed)
   PRENATAL VISIT NOTE  Subjective:  Veronica [name] is a 30 y.o. G2P1001 at 105w5d being seen today for ongoing prenatal care.  She is currently monitored for the following issues for this high-risk pregnancy and has BMI 50.0-59.9, adult (HCC); Anemia; GERD (gastroesophageal reflux disease); GAD (generalized anxiety disorder); Subclinical hypothyroidism; Obesity complicating pregnancy; Epigastric pain; Hiatal hernia; Back pain; Asthma; Supervision of high risk pregnancy, antepartum; History of cesarean delivery; Syncope; Asthma complicating pregnancy, antepartum; and Morbid obesity (HCC) on their problem list.  Patient reports no complaints.  Contractions: Not present. Vag. Bleeding: None.  Movement: Present. Denies leaking of fluid.   The following portions of the patient's history were reviewed and updated as appropriate: allergies, current medications, past family history, past medical history, past social history, past surgical history and problem list.   Objective:   Vitals:   10/03/21 1500  BP: 116/75  Pulse: 90  Weight: 275 lb 12.8 oz (125.1 kg)    Fetal Status: Fetal Heart Rate (bpm): 131   Movement: Present     General:  Alert, oriented and cooperative. Patient is in no acute distress.  Skin: Skin is warm and dry. No rash noted.   Cardiovascular: Normal heart rate noted  Respiratory: Normal respiratory effort, no problems with respiration noted  Abdomen: Soft, gravid, appropriate for gestational age.  Pain/Pressure: Present     Pelvic: Cervical exam performed in the presence of a chaperone        Extremities: Normal range of motion.  Edema: Trace  Mental Status: Normal mood and affect. Normal behavior. Normal judgment and thought content.   Assessment and Plan:  Pregnancy: G2P1001 at [redacted]w[redacted]d 1. Morbid obesity (HCC) IOL per Dr. Parke Poisson by 40 weeks  2. History of cesarean delivery Desires TOLAC  3. Supervision of high risk pregnancy, antepartum Cx unfavorable FB not placed  today  4. Obesity affecting pregnancy in third trimester Body mass index is 50.44 kg/m.   Term labor symptoms and general obstetric precautions including but not limited to vaginal bleeding, contractions, leaking of fluid and fetal movement were reviewed in detail with the patient. Please refer to After Visit Summary for other counseling recommendations.   Return for postpartum.  Future Appointments  Date Time Provider Department Center  10/04/2021  7:15 AM [facility] SCHED ROOM [facility] None  10/18/2021 10:45 AM [facility]    Scheryl Darter, MD

## 2021-10-04 ENCOUNTER — Inpatient Hospital Stay (HOSPITAL_COMMUNITY)
Admission: AD | Admit: 2021-10-04 | Discharge: 2021-10-09 | DRG: 788 | Disposition: A | Discharge status: Home / Self Care | Payer: Medicaid Other | Attending: Obstetrics and Gynecology | Admitting: Obstetrics and Gynecology

## 2021-10-04 ENCOUNTER — Encounter (HOSPITAL_COMMUNITY): Payer: Self-pay | Admitting: Family Medicine

## 2021-10-04 ENCOUNTER — Inpatient Hospital Stay (HOSPITAL_COMMUNITY): Payer: Medicaid Other

## 2021-10-04 DIAGNOSIS — J45909 Unspecified asthma, uncomplicated: Secondary | ICD-10-CM | POA: Diagnosis present

## 2021-10-04 DIAGNOSIS — K219 Gastro-esophageal reflux disease without esophagitis: Secondary | ICD-10-CM | POA: Diagnosis present

## 2021-10-04 DIAGNOSIS — O99824 Streptococcus B carrier state complicating childbirth: Secondary | ICD-10-CM | POA: Diagnosis present

## 2021-10-04 DIAGNOSIS — O99519 Diseases of the respiratory system complicating pregnancy, unspecified trimester: Secondary | ICD-10-CM | POA: Diagnosis present

## 2021-10-04 DIAGNOSIS — Z20822 Contact with and (suspected) exposure to covid-19: Secondary | ICD-10-CM | POA: Diagnosis present

## 2021-10-04 DIAGNOSIS — O99820 Streptococcus B carrier state complicating pregnancy: Secondary | ICD-10-CM | POA: Diagnosis not present

## 2021-10-04 DIAGNOSIS — Z3A39 39 weeks gestation of pregnancy: Secondary | ICD-10-CM | POA: Diagnosis not present

## 2021-10-04 DIAGNOSIS — O34219 Maternal care for unspecified type scar from previous cesarean delivery: Secondary | ICD-10-CM

## 2021-10-04 DIAGNOSIS — O34211 Maternal care for low transverse scar from previous cesarean delivery: Secondary | ICD-10-CM | POA: Diagnosis not present

## 2021-10-04 DIAGNOSIS — Z6841 Body Mass Index (BMI) 40.0 and over, adult: Secondary | ICD-10-CM

## 2021-10-04 DIAGNOSIS — O99214 Obesity complicating childbirth: Secondary | ICD-10-CM | POA: Diagnosis present

## 2021-10-04 DIAGNOSIS — O99344 Other mental disorders complicating childbirth: Secondary | ICD-10-CM | POA: Diagnosis present

## 2021-10-04 DIAGNOSIS — O9962 Diseases of the digestive system complicating childbirth: Secondary | ICD-10-CM | POA: Diagnosis present

## 2021-10-04 DIAGNOSIS — O0990 Supervision of high risk pregnancy, unspecified, unspecified trimester: Secondary | ICD-10-CM

## 2021-10-04 DIAGNOSIS — O9952 Diseases of the respiratory system complicating childbirth: Secondary | ICD-10-CM | POA: Diagnosis present

## 2021-10-04 DIAGNOSIS — O99210 Obesity complicating pregnancy, unspecified trimester: Secondary | ICD-10-CM | POA: Diagnosis present

## 2021-10-04 DIAGNOSIS — F411 Generalized anxiety disorder: Secondary | ICD-10-CM | POA: Diagnosis not present

## 2021-10-04 DIAGNOSIS — Z3A40 40 weeks gestation of pregnancy: Secondary | ICD-10-CM | POA: Diagnosis not present

## 2021-10-04 LAB — CBC
HCT: 33.3 % — ABNORMAL LOW (ref 36.0–46.0)
Hemoglobin: 11.2 g/dL — ABNORMAL LOW (ref 12.0–15.0)
MCH: 27 pg (ref 26.0–34.0)
MCHC: 33.6 g/dL (ref 30.0–36.0)
MCV: 80.2 fL (ref 80.0–100.0)
Platelets: 272 10*3/uL (ref 150–400)
RBC: 4.15 MIL/uL (ref 3.87–5.11)
RDW: 16.5 % — ABNORMAL HIGH (ref 11.5–15.5)
WBC: 8.1 10*3/uL (ref 4.0–10.5)
nRBC: 0 % (ref 0.0–0.2)

## 2021-10-04 LAB — TYPE AND SCREEN
ABO/RH(D): AB POS
Antibody Screen: NEGATIVE

## 2021-10-04 LAB — RESP PANEL BY RT-PCR (FLU A&B, COVID) ARPGX2
Influenza A by PCR: NEGATIVE
Influenza B by PCR: NEGATIVE
SARS Coronavirus 2 by RT PCR: NEGATIVE

## 2021-10-04 MED ORDER — PENICILLIN G POT IN DEXTROSE 60000 UNIT/ML IV SOLN
3.0000 10*6.[IU] | INTRAVENOUS | Status: DC
  Administered 2021-10-04 – 2021-10-06 (×13): 3 10*6.[IU] via INTRAVENOUS
  Filled 2021-10-04 (×13): qty 50

## 2021-10-04 MED ORDER — ONDANSETRON HCL 4 MG/2ML IJ SOLN
4.0000 mg | Freq: Four times a day (QID) | INTRAMUSCULAR | Status: DC | PRN
  Administered 2021-10-05 (×2): 4 mg via INTRAVENOUS
  Filled 2021-10-04 (×2): qty 2

## 2021-10-04 MED ORDER — OXYTOCIN-SODIUM CHLORIDE 30-0.9 UT/500ML-% IV SOLN
2.5000 [IU]/h | INTRAVENOUS | Status: DC
  Filled 2021-10-04: qty 500

## 2021-10-04 MED ORDER — OXYTOCIN BOLUS FROM INFUSION: 333.0000 mL | Freq: Once | INTRAVENOUS | Status: DC

## 2021-10-04 MED ORDER — HYDROXYZINE HCL 10 MG PO TABS
10.0000 mg | ORAL_TABLET | Freq: Three times a day (TID) | ORAL | Status: DC | PRN
  Administered 2021-10-04 – 2021-10-06 (×3): 10 mg via ORAL
  Filled 2021-10-04 (×4): qty 1

## 2021-10-04 MED ORDER — LACTATED RINGERS IV SOLN: 500.0000 mL | INTRAVENOUS | Status: DC | PRN

## 2021-10-04 MED ORDER — LACTATED RINGERS IV SOLN: INTRAVENOUS | Status: DC

## 2021-10-04 MED ORDER — SOD CITRATE-CITRIC ACID 500-334 MG/5ML PO SOLN
30.0000 mL | ORAL | Status: DC | PRN
  Administered 2021-10-06: 30 mL via ORAL
  Filled 2021-10-04 (×2): qty 30

## 2021-10-04 MED ORDER — FENTANYL CITRATE (PF) 100 MCG/2ML IJ SOLN
100.0000 ug | INTRAMUSCULAR | Status: DC | PRN
  Administered 2021-10-05: 100 ug via INTRAVENOUS
  Filled 2021-10-04 (×2): qty 2

## 2021-10-04 MED ORDER — SODIUM CHLORIDE 0.9 % IV SOLN: 5.0000 10*6.[IU] | Freq: Once | INTRAVENOUS | Status: DC

## 2021-10-04 MED ORDER — ACETAMINOPHEN 325 MG PO TABS
650.0000 mg | ORAL_TABLET | ORAL | Status: DC | PRN
Start: 2021-10-04 — End: 2021-10-07

## 2021-10-04 MED ORDER — TERBUTALINE SULFATE 1 MG/ML IJ SOLN: 0.2500 mg | Freq: Once | INTRAMUSCULAR | Status: DC | PRN

## 2021-10-04 MED ORDER — LIDOCAINE HCL (PF) 1 % IJ SOLN
30.0000 mL | INTRAMUSCULAR | Status: DC | PRN
Start: 2021-10-04 — End: 2021-10-07

## 2021-10-04 MED ORDER — OXYTOCIN-SODIUM CHLORIDE 30-0.9 UT/500ML-% IV SOLN
1.0000 m[IU]/min | INTRAVENOUS | Status: DC
  Administered 2021-10-04: 2 m[IU]/min via INTRAVENOUS

## 2021-10-04 NOTE — Progress Notes (Signed)
Labor Progress Note Veronica [name] is a 30 y.o. G2P1001 at [redacted]w[redacted]d presented for IOL and TOLAC. S: Patient is not having excess pain. Able to ambulate and pass urine.  O:  BP 123/78   Pulse 82   Temp 98.2 F (36.8 C) (Oral)   Resp 18   Ht 5\' 2"  (1.575 m)   Wt 125 kg   LMP 12/29/2020 (Exact Date)   BMI 50.40 kg/m  EFM: baseline 130/moderate variability/positive acels, no decels  CVE: Dilation: Fingertip Effacement (%): 50 Cervical Position: Anterior Station: -3 Presentation: Vertex Exam by:: L Leftwichn Kirby CNM Foley balloon placed by L Leftwichn Kirby CNM, without difficulty, patient tolerated well.   A&P: 30 y.o. G2P1001 [redacted]w[redacted]d presented for IOL and TOLAC. #Labor: Progressing well. Foley balloon placed in uterus without complications. #Pain: No control yet. #FWB: Category I #GBS positive   [redacted]w[redacted]d, Medical Student 7:13 PM

## 2021-10-04 NOTE — BH Specialist Note (Signed)
Integrated Behavioral Health via Telemedicine Visit  10/04/2021 Veronica [name] [id number]  Number of Integrated Behavioral Health visits: 4 Session Start time: 10:51  Session End time: 11:12 Total time:  21  Referring Provider: Merian Capron, MD Patient/Family location: Home [facility] Provider location: Center for [facility]  All persons participating in visit: Patient Veronica [name] and Veronica [name]   Types of Service: Individual psychotherapy and Telephone visit  I connected with Veronica [name] and/or Veronica [name]'s  n/a  via  Telephone or Video Enabled Telemedicine Application  (Video is Caregility application) and verified that I am speaking with the correct person using two identifiers. Discussed confidentiality: Yes   I discussed the limitations of telemedicine and the availability of in person appointments.  Discussed there is a possibility of technology failure and discussed alternative modes of communication if that failure occurs.  I discussed that engaging in this telemedicine visit, they consent to the provision of behavioral healthcare and the services will be billed under their insurance.  Patient and/or legal guardian expressed understanding and consented to Telemedicine visit: Yes   Presenting Concerns: Patient and/or family reports the following symptoms/concerns: Anxiety "flare-ups", fatigue postpartum; requests to resume Zoloft at 100mg  and requests okay to return to gym (treadmill only) as soon as able.  Duration of problem: Ongoing; Severity of problem: moderate  Patient and/or Family's Strengths/Protective Factors: Social connections, Concrete supports in place (healthy food, safe environments, etc.), Sense of purpose, and Physical Health (exercise, healthy diet, medication compliance, etc.)  Goals Addressed: Patient will:  Reduce symptoms of: anxiety   Increase knowledge and/or ability of: healthy habits    Progress towards Goals: Ongoing  Interventions: Interventions utilized:  Psychoeducation and/or Health Education and Supportive Reflection Standardized Assessments completed:  given in past two weeks  Patient and/or Family Response: Pt agrees with treatment plan  Assessment: Patient currently experiencing Generalized anxiety disorder.   Patient may benefit from continued psychoeducation and brief therapeutic interventions regarding coping with symptoms of anxiety .  Plan: Follow up with behavioral health clinician on : Two weeks Behavioral recommendations:  -Continue taking BH medication (Zoloft/Vistaril), and iron pills, all as prescribed -Continue prioritizing sleep when baby sleeps daily for next two weeks; accepting practical support from family/friends  Referral(s): Integrated Veronica [name] (In Clinic)  I discussed the assessment and treatment plan with the patient and/or parent/guardian. They were provided an opportunity to ask questions and all were answered. They agreed with the plan and demonstrated an understanding of the instructions.   They were advised to call back or seek an in-person evaluation if the symptoms worsen or if the condition fails to improve as anticipated.  [employer], LCSW  Depression screen [facility] 2/9 10/02/2021 09/23/2021 08/28/2021 08/13/2021 07/10/2021  Decreased Interest 1 1 1 1 1   Down, Depressed, Hopeless 1 1 2 1 1   PHQ - 2 Score 2 2 3 2 2   Altered sleeping 1 1 2 1 1   Tired, decreased energy 1 2 1 1 1   Change in appetite 1 0 0 1 1  Feeling bad or failure about yourself  0 0 0 0 0  Trouble concentrating 0 0 0 0 0  Moving slowly or fidgety/restless 0 0 0 0 0  Suicidal thoughts 0 0 0 0 0  PHQ-9 Score 5 5 6 5 5   Difficult doing work/chores - - - - -  Some recent data might be hidden   GAD 7 :  Generalized Anxiety Score 09/23/2021 08/28/2021 08/13/2021 07/10/2021  Nervous, Anxious, on Edge 1 3 2 2   Control/stop worrying 1  3 3 2   Worry too much - different things 1 3 3 2   Trouble relaxing 1 1 1 1   Restless 1 1 1  0  Easily annoyed or irritable 2 1 1 3   Afraid - awful might happen 1 2 2  0  Total GAD 7 Score 8 14 13 10   Anxiety Difficulty - - - -    Edinburgh Postnatal Depression Scale Screening Tool 10/08/2021  I have been able to laugh and see the funny side of things. 0  I have looked forward with enjoyment to things. 0  I have blamed myself unnecessarily when things went wrong. 1  I have been anxious or worried for no good reason. 2  I have felt scared or panicky for no good reason. 2  Things have been getting on top of me. 1  I have been so unhappy that I have had difficulty sleeping. 1  I have felt sad or miserable. 0  I have been so unhappy that I have been crying. 0  The thought of harming myself has occurred to me. 0  Edinburgh Postnatal Depression Scale Total 7

## 2021-10-04 NOTE — Progress Notes (Signed)
Labor Progress Note Veronica [name] is a 30 y.o. G2P1001 at [redacted]w[redacted]d admitted for elective IOL and TOLAC.  S: Resting comfortably, sitting upright in bed.  O:  BP (!) 142/105   Pulse (!) 102   Temp 97.6 F (36.4 C) (Oral)   Resp 18   Ht 5\' 2"  (1.575 m)   Wt 125 kg   LMP 12/29/2020 (Exact Date)   BMI 50.40 kg/m  EFM: baseline 135/ moderate variability/accelerations present/one isolated variable deceleration  CVE: Dilation: Fingertip Effacement (%): 50 Cervical Position: Anterior Station: -3 Presentation: Vertex Exam by:: L Leftwichn Kirby CNM   A&P: 30 y.o. G2P1001 [redacted]w[redacted]d admitted for elective IOL and TOLAC.  #Labor: Foley baloon in place @1830 . Pitocin since 1431.  #Pain: Well controlled at this time #FWB: Category II, position changes implemented as needed #GBS positive. Penicillin ordered. #Anxiety - home zoloft ordered and prn hydroxyzine ordered   [redacted]w[redacted]d, MD 10:26 PM

## 2021-10-04 NOTE — H&P (Signed)
OBSTETRIC ADMISSION HISTORY AND PHYSICAL  Veronica [name] is a 30 y.o. female G2P1001 with IUP at 41w6dby LMP presenting for IOL and TOLAC. She reports +FMs, No LOF, no VB, no blurry vision, headaches or peripheral edema, and RUQ pain.  She plans on breast feeding. She request IUD for birth control. [facility]  Dating: By LMP --->  Estimated Date of Delivery: 10/05/21  Sono:    @[redacted]w[redacted]d , CWD, normal anatomy, Cephalic presentation, 32202R 56% EFW   [facility]Complications: [facility] Past Medical History: Past Medical History:  Diagnosis Date   Abdominal pain 01/06/2012   Acute tension headache 07/27/2019   Anemia    Ankle pain    Asthma    Birth control counseling 08/26/2011   BMI 40.0-44.9, [place] 02/27/2011   GERD (gastroesophageal reflux disease)    Hiatal hernia    Obesity (BMI 30-39.9)    Secondary amenorrhea 03/25/2018    Past Surgical History: Past Surgical History:  Procedure Laterality Date   CESAREAN SECTION  09/02/2010   For macrosomia, but son was 7 lb 12 oz   TONSILLECTOMY AND ADENOIDECTOMY      Obstetrical History: OB History     Gravida  2   Para  1   Term  1   Preterm      AB      Living  1      SAB      IAB      Ectopic      Multiple      Live Births  1           Social History Social History   Socioeconomic History   Marital status: Single    Spouse name: Not on file   Number of children: Not on file   Years of education: Not on file   Highest education level: Not on file  Occupational History   Occupation: rRadiographer, therapeutic UNEMPLOYED    Comment: sales  Tobacco Use   Smoking status: Never   Smokeless tobacco: Never  Vaping Use   Vaping Use: Never used  Substance and Sexual Activity   Alcohol use: No   Drug use: Not Currently    Comment: last used in teens   Sexual activity: Not Currently    Birth control/protection: None  Other Topics Concern    Not on file  Social History Narrative   Lives son ([name] 09/02/2010 ), sister (Gabriel CirriMinor 10/28/94), and mom (Mary Galicia-Gillis) and mom's fiance.    Attending [employer] obtain GED.   Mom's fiance smokes outside house.    Pet: dog               Social Determinants of HRadio broadcast assistantStrain: Not on file  Food Insecurity: No Food Insecurity   Worried About RCharity fundraiserin the Last Year: Never true   Ran Out of Food in the Last Year: Never true  Transportation Needs: No Transportation Needs   Lack of Transportation (Medical): No   Lack of Transportation (Non-Medical): No  Physical Activity: Not on file  Stress: Not on file  Social Connections: Not on file    Family History: Family History  Problem Relation Age of Onset   Diabetes Mother    Hypertension Mother    Obesity Mother    Heart attack Mother 440      Death  Obesity Sister    Osteochondroma Sister    Diabetes Sister    Hypertension Sister     Allergies: Allergies  Allergen Reactions   Morphine And Related Other (See Comments)    Causes "chest to be heavy"   Motrin [Ibuprofen]     Was told to not take this because it would flare stomach issues    Medications Prior to Admission  Medication Sig Dispense Refill Last Dose   cetirizine (ZYRTEC) 5 MG tablet Take 5 mg by mouth daily.   10/04/2021   cyclobenzaprine (FLEXERIL) 10 MG tablet Take 1 tablet (10 mg total) by mouth every 8 (eight) hours as needed for muscle spasms. 30 tablet 1 Past Week   ferrous sulfate 325 (65 FE) MG tablet Take 1 tablet (325 mg total) by mouth every other day. 30 tablet 1 10/03/2021   fluticasone (FLOVENT HFA) 44 MCG/ACT inhaler Inhale 2 puffs into the lungs in the morning and at bedtime. 1 each 12 10/04/2021   hydrOXYzine (ATARAX/VISTARIL) 10 MG tablet Take 1 tablet (10 mg total) by mouth 3 (three) times daily as needed for anxiety. 30 tablet 2 10/03/2021   omeprazole (PRILOSEC) 20 MG capsule Take 1 capsule  (20 mg total) by mouth in the morning and at bedtime. 60 capsule 3 10/03/2021   Prenatal Vit-Fe Fumarate-FA (PRENATAL COMPLETE) 14-0.4 MG TABS Take 1 tablet by mouth daily. 60 tablet 3 10/03/2021   sertraline (ZOLOFT) 50 MG tablet Take 1 tablet (50 mg total) by mouth daily. 30 tablet 5 10/03/2021   albuterol (VENTOLIN HFA) 108 (90 Base) MCG/ACT inhaler INHALE 2 PUFFS BY MOUTH EVERY 4 HOURS AS NEEDED FOR WHEEZING OR SHORTNESS OF BREATH (COUGH). 18 g 0    Blood Pressure Monitoring (BLOOD PRESSURE KIT) DEVI 1 Device by Does not apply route once a week. 1 each 0    docusate sodium (COLACE) 100 MG capsule Take 1 capsule (100 mg total) by mouth 2 (two) times daily. 60 capsule 1    polyethylene glycol (MIRALAX / GLYCOLAX) 17 g packet Take 17 g by mouth daily. 14 each 1      Review of Systems   All systems reviewed and negative except as stated in HPI  Blood pressure (!) 112/42, pulse (!) 102, temperature 98.2 F (36.8 C), temperature source Oral, resp. rate 18, height 5' 2"  (1.575 m), weight 125 kg, last menstrual period 12/29/2020. General appearance: alert and no distress Lungs: clear to auscultation bilaterally Heart: regular rate and rhythm Abdomen: soft, non-tender; bowel sounds normal Extremities: Homans sign is negative, no sign of DVT Presentation: cephalic Fetal monitoringBaseline: 135 bpm, Variability: Good {> 6 bpm), and Accelerations: Reactive Uterine activityNone     Prenatal labs: ABO, Rh: --/--/PENDING (10/28 1217) Antibody: PENDING (10/28 1217) Rubella: 8.83 (03/01 1159) RPR: Non Reactive (08/03 1352)  HBsAg: Negative (03/01 1159)  HIV: Non Reactive (08/03 1352)  GBS: Positive/-- (10/05 1615)  1 hr Glucola Normal Genetic screening  Normal Anatomy US Normal  Prenatal Transfer Tool  Maternal Diabetes: No Genetic Screening: Normal Maternal Ultrasounds/Referrals: Normal Fetal Ultrasounds or other Referrals:  None Maternal Substance Abuse:  No Significant Maternal  Medications:  None Significant Maternal Lab Results: Group B Strep positive  Results for orders placed or performed during the hospital encounter of 10/04/21 (from the past 24 hour(s))  CBC   Collection Time: 10/04/21 12:17 PM  Result Value Ref Range   WBC 8.1 4.0 - 10.5 K/uL   RBC 4.15 3.87 - 5.11 MIL/uL   Hemoglobin  11.2 (L) 12.0 - 15.0 g/dL   HCT 33.3 (L) 36.0 - 46.0 %   MCV 80.2 80.0 - 100.0 fL   MCH 27.0 26.0 - 34.0 pg   MCHC 33.6 30.0 - 36.0 g/dL   RDW 16.5 (H) 11.5 - 15.5 %   Platelets 272 150 - 400 K/uL   nRBC 0.0 0.0 - 0.2 %  Type and screen   Collection Time: 10/04/21 12:17 PM  Result Value Ref Range   ABO/RH(D) PENDING    Antibody Screen PENDING    Sample Expiration      10/07/2021,2359 Performed at [facility], Home [address], [place], [place] [address]     Patient Active Problem List   Diagnosis Date Noted   Asthma complicating pregnancy, antepartum 06/17/2021   Morbid obesity ([place]) 06/17/2021   Syncope 04/12/2021   Supervision of high risk pregnancy, antepartum 03/20/2021   History of cesarean delivery 03/20/2021   Asthma 08/31/2020   Back pain 02/13/2020   Hiatal hernia 11/02/2019   Epigastric pain 64/07/9096   Obesity complicating pregnancy 52/95/5397   Subclinical hypothyroidism 05/19/2019   GAD (generalized anxiety disorder) 05/09/2019   GERD (gastroesophageal reflux disease) 03/28/2011   BMI 50.0-59.9, adult ([place]) 02/27/2011   Anemia 02/27/2011    Assessment/Plan:  Veronica [name] is a 30 y.o. G2P1001 at 28w6dhere for IOL and TOLAC.  #Labor: Induced, will receive low pitocin for cervical ripening #Pain: Epidural #FWB: Baseline: 135 bpm, Variability: Good {> 6 bpm), and Accelerations: Reactive #ID:  GBS positive, penicillin ordered #MOF: Breast #MOC:Undecided, does not want further children #Circ:  Yes, needs consent form  PGertie Fey Medical Student  10/04/2021, 1:17 PM

## 2021-10-05 ENCOUNTER — Encounter (HOSPITAL_COMMUNITY): Payer: Self-pay | Admitting: Anesthesiology

## 2021-10-05 ENCOUNTER — Inpatient Hospital Stay (HOSPITAL_COMMUNITY): Payer: Medicaid Other | Admitting: Anesthesiology

## 2021-10-05 LAB — RPR: RPR Ser Ql: NONREACTIVE

## 2021-10-05 MED ORDER — FENTANYL-BUPIVACAINE-NACL 0.5-0.125-0.9 MG/250ML-% EP SOLN
12.0000 mL/h | EPIDURAL | Status: DC | PRN
  Administered 2021-10-05: 12 mL/h via EPIDURAL
  Filled 2021-10-05: qty 250

## 2021-10-05 MED ORDER — EPHEDRINE 5 MG/ML INJ: 10.0000 mg | INTRAVENOUS | Status: DC | PRN

## 2021-10-05 MED ORDER — PHENYLEPHRINE 40 MCG/ML (10ML) SYRINGE FOR IV PUSH (FOR BLOOD PRESSURE SUPPORT)
80.0000 ug | PREFILLED_SYRINGE | INTRAVENOUS | Status: DC | PRN
  Filled 2021-10-05: qty 10

## 2021-10-05 MED ORDER — LACTATED RINGERS IV SOLN
500.0000 mL | Freq: Once | INTRAVENOUS | Status: AC
  Administered 2021-10-05: 500 mL via INTRAVENOUS

## 2021-10-05 MED ORDER — LIDOCAINE HCL (PF) 1 % IJ SOLN
INTRAMUSCULAR | Status: DC | PRN
  Administered 2021-10-05: 10 mL via EPIDURAL

## 2021-10-05 MED ORDER — DIPHENHYDRAMINE HCL 50 MG/ML IJ SOLN: 12.5000 mg | INTRAMUSCULAR | Status: DC | PRN

## 2021-10-05 MED ORDER — PANTOPRAZOLE SODIUM 40 MG PO TBEC
40.0000 mg | DELAYED_RELEASE_TABLET | Freq: Every day | ORAL | Status: DC
  Administered 2021-10-05 – 2021-10-06 (×2): 40 mg via ORAL
  Filled 2021-10-05 (×2): qty 1

## 2021-10-05 MED ORDER — PHENYLEPHRINE 40 MCG/ML (10ML) SYRINGE FOR IV PUSH (FOR BLOOD PRESSURE SUPPORT): 80.0000 ug | PREFILLED_SYRINGE | INTRAVENOUS | Status: DC | PRN

## 2021-10-05 MED ORDER — BUTORPHANOL TARTRATE 1 MG/ML IJ SOLN
2.0000 mg | Freq: Once | INTRAMUSCULAR | Status: DC
Start: 2021-10-05 — End: 2021-10-07
  Filled 2021-10-05: qty 2

## 2021-10-05 MED ORDER — FENTANYL-BUPIVACAINE-NACL 0.5-0.125-0.9 MG/250ML-% EP SOLN
12.0000 mL/h | EPIDURAL | Status: DC | PRN
  Administered 2021-10-06: 12 mL/h via EPIDURAL
  Filled 2021-10-05: qty 250

## 2021-10-05 NOTE — Progress Notes (Signed)
Pt up to bathrrom.  Upon return to bed external montiors removed, abdomen cleanses with warm water and dried.  F&m monitor applied.  The pt assisted to lazy boy chair for comfort

## 2021-10-05 NOTE — Progress Notes (Signed)
Labor Progress Note Veronica [name] is a 30 y.o. G2P1001 at [redacted]w[redacted]d admitted for elective IOL and TOLAC.  S: Resting comfortably, sitting upright in bed.  O:  BP (!) 113/58   Pulse 75   Temp 98 F (36.7 C) (Oral)   Resp 16   Ht 5\' 2"  (1.575 m)   Wt 125 kg   LMP 12/29/2020 (Exact Date)   BMI 50.40 kg/m  EFM: baseline 140/ moderate variability/accelerations present/no decelerations  CVE: Dilation: 3 Effacement (%): 60 Cervical Position: Anterior Station: -3 Presentation: Vertex Exam by:: Dr. [email or web address]   A&P: 30 y.o. G2P1001 [redacted]w[redacted]d admitted for elective IOL and TOLAC.  #Labor: Progressing well. Pitocin since 1431 with regular contraction pattern.  #Pain: Well controlled at this time #FWB: Category I, continue to monitor #GBS positive. Penicillin ordered. #Anxiety - home zoloft ordered and prn hydroxyzine ordered   [redacted]w[redacted]d, MD 5:46 AM

## 2021-10-05 NOTE — Progress Notes (Signed)
F&m monitor not tracing consistantly, therefore removed.  1007 external monitors re applied

## 2021-10-05 NOTE — Progress Notes (Signed)
Labor Progress Note Veronica [name] is a 29 y.o. G2P1001 at [redacted]w[redacted]d presented for eIOL/TOLAC.  S: Doing well. No concerns at this time.  O:  BP (!) 107/48   Pulse 68   Temp 98.4 F (36.9 C) (Oral)   Resp 19   Ht 5\' 2"  (1.575 m)   Wt 125 kg   LMP 12/29/2020 (Exact Date)   BMI 50.40 kg/m   EFM: Baseline 130 bpm, moderate variability, + accels, no decels  CVE: Dilation: 3 Effacement (%): 60 Cervical Position: Anterior Station: -3 Presentation: Vertex Exam by:: Dr. [email or web address]   A&P: 30 y.o. G2P1001 [redacted]w[redacted]d   #Labor: Reassuring exam last check on Pitocin. S/p foley balloon. Will plan to reassess progress around 10 am.  #Pain: PRN; would like IV pain medication for now and is planning for epidural later when ready #FWB: Cat 1 #GBS positive > PCN  [redacted]w[redacted]d, MD 9:29 AM

## 2021-10-05 NOTE — Progress Notes (Signed)
Labor Progress Note Veronica [name] is a 30 y.o. G2P1001 at [redacted]w[redacted]d presented for eIOL/TOLAC.   S: FB now out. Doing well, not feeling any contractions yet.   O:  BP 115/74   Pulse 82   Temp 98 F (36.7 C) (Oral)   Resp 18   Ht 5\' 2"  (1.575 m)   Wt 125 kg   LMP 12/29/2020 (Exact Date)   BMI 50.40 kg/m  EFM: 130/mod/15x15/none  Occasional ctx   CVE: Dilation: 1 Effacement (%): thick  Cervical Position: Posterior  Station: -3 Presentation: Vertex Exam by:: Dr. [email or web address]   A&P: 30 y.o. G2P1001 [redacted]w[redacted]d  #Labor: Cervix only feels about 1cm and thick, unclear if FB was within cervix. Attempted to place another FB via speculum (too posterior for manual), however unable to visualize region. Will cont with pit, 2x2, and recheck in a few hours. #Pain: PRN  #FWB: Cat 1  #GBS positive  [redacted]w[redacted]d, DO 1:57 AM

## 2021-10-05 NOTE — Anesthesia Procedure Notes (Signed)
Epidural Patient location during procedure: OB Start time: 10/05/2021 7:43 PM End time: 10/05/2021 7:48 PM  Staffing Anesthesiologist: Bethena Midget, MD  Preanesthetic Checklist Completed: patient identified, IV checked, site marked, risks and benefits discussed, surgical consent, monitors and equipment checked, pre-op evaluation and timeout performed  Epidural Patient position: sitting Prep: DuraPrep and site prepped and draped Patient monitoring: continuous pulse ox and blood pressure Approach: midline Location: L3-L4 Injection technique: LOR air  Needle:  Needle type: Tuohy  Needle gauge: 17 G Needle length: 9 cm and 9 Needle insertion depth: 9 cm Catheter type: closed end flexible Catheter size: 19 Gauge Catheter at skin depth: 15 cm Test dose: negative  Assessment Events: blood not aspirated, injection not painful, no injection resistance, no paresthesia and negative IV test

## 2021-10-05 NOTE — Progress Notes (Signed)
Pt sitting up to eat

## 2021-10-05 NOTE — Progress Notes (Addendum)
Labor Progress Note Veronica [name] is a 30 y.o. G2P1001 at [redacted]w[redacted]d presented for eIOL.   S: Doing well with epidural, previously having painful contractions.   O:  BP 126/75   Pulse 75   Temp 97.8 F (36.6 C) (Axillary)   Resp 18   Ht 5\' 2"  (1.575 m)   Wt 125 kg   LMP 12/29/2020 (Exact Date)   BMI 50.40 kg/m  EFM: 125/mod/15x15/none  Difficult to trace UC   CVE: Dilation: 1 Effacement (%): 60 Cervical Position: Anterior Station: -3 Presentation: Vertex Exam by:: Dr [email or web address] MD   A&P: 30 y.o. G2P1001 [redacted]w[redacted]d  #Labor: Pulled on FB, still in place. Will cont to monitor. If still in place in the next few hours, will do vaginal exam to assess location.  #Pain: Epidural  #FWB: Cat 1  #GBS Positive, PCN   [redacted]w[redacted]d, DO 10:12 PM

## 2021-10-05 NOTE — Progress Notes (Signed)
Veronica [name] is a 30 y.o. G2P1001 at [redacted]w[redacted]d by LMP admitted for induction of labor due to morbid obesity.  Subjective: Patient has been frustrated with lack of progress and wants to discuss prognosis and options  Objective: BP 124/73   Pulse (!) 105   Temp 97.9 F (36.6 C) (Oral)   Resp 16   Ht 5\' 2"  (1.575 m)   Wt 125 kg   LMP 12/29/2020 (Exact Date)   BMI 50.40 kg/m  No intake/output data recorded. No intake/output data recorded.  FHT:  FHR: 145 bpm, variability: moderate,  accelerations:  Present,  decelerations:  Absent UC:   none SVE:   Dilation: 1 Effacement (%): 60 Station: -3 Exam by:: Dr [email or web address] MD  Labs: Lab Results  Component Value Date   WBC 8.1 10/04/2021   HGB 11.2 (L) 10/04/2021   HCT 33.3 (L) 10/04/2021   MCV 80.2 10/04/2021   PLT 272 10/04/2021    Assessment / Plan: Protracted latent phase  Labor:  cervix still unfavorable Preeclampsia:  no signs or symptoms of toxicity Fetal Wellbeing:  Category I Pain Control:  Labor support without medications I/D:  n/a Anticipated MOD:   Discussed continued TOLAC if we can accomplish cervical ripening and she accepts [facility] catheter placement for this purpose, placed vaginally with no difficulty and 60 ml filled both balloons  [facility] 10/05/2021, 4:17 PM

## 2021-10-05 NOTE — Anesthesia Preprocedure Evaluation (Addendum)
Anesthesia Evaluation  Patient identified by MRN, date of birth, ID band Patient awake    Reviewed: Allergy & Precautions, H&P , NPO status , Patient's Chart, lab work & pertinent test results, reviewed documented beta blocker date and time   History of Anesthesia Complications Negative for: history of anesthetic complications  Airway Mallampati: I  TM Distance: >3 FB Neck ROM: full    Dental no notable dental hx. (+) Teeth Intact, Dental Advisory Given   Pulmonary asthma ,    Pulmonary exam normal breath sounds clear to auscultation       Cardiovascular negative cardio ROS Normal cardiovascular exam Rhythm:regular Rate:Normal     Neuro/Psych  Headaches, PSYCHIATRIC DISORDERS Anxiety    GI/Hepatic Neg liver ROS, hiatal hernia, GERD  Medicated and Controlled,  Endo/Other  Hypothyroidism   Renal/GU negative Renal ROS  negative genitourinary   Musculoskeletal   Abdominal   Peds  Hematology  (+) Blood dyscrasia, anemia ,   Anesthesia Other Findings   Reproductive/Obstetrics (+) Pregnancy                            Anesthesia Physical Anesthesia Plan  ASA: 3 and emergent  Anesthesia Plan: Epidural   Post-op Pain Management:    Induction:   PONV Risk Score and Plan: 3 and Ondansetron, Dexamethasone and Treatment may vary due to age or medical condition  Airway Management Planned: Natural Airway  Additional Equipment: None  Intra-op Plan:   Post-operative Plan:   Informed Consent: I have reviewed the patients History and Physical, chart, labs and discussed the procedure including the risks, benefits and alternatives for the proposed anesthesia with the patient or authorized representative who has indicated his/her understanding and acceptance.     Dental Advisory Given  Plan Discussed with: Anesthesiologist  Anesthesia Plan Comments: (Labs checked- platelets confirmed with RN  in room. Fetal heart tracing, per RN, reported to be stable enough for sitting procedure. Discussed epidural, and patient consents to the procedure:  included risk of possible headache,backache, failed block, allergic reaction, and nerve injury. This patient was asked if she had any questions or concerns before the procedure started.  Epidural to be used for urgent C/S (failure to progress). Stephannie Peters, MD)       Anesthesia Quick Evaluation

## 2021-10-06 ENCOUNTER — Encounter (HOSPITAL_COMMUNITY): Payer: Self-pay | Admitting: Family Medicine

## 2021-10-06 ENCOUNTER — Encounter (HOSPITAL_COMMUNITY)
Admission: AD | Disposition: A | Discharge status: Home / Self Care | Payer: Self-pay | Source: Home / Self Care | Attending: Obstetrics and Gynecology

## 2021-10-06 DIAGNOSIS — O99820 Streptococcus B carrier state complicating pregnancy: Secondary | ICD-10-CM

## 2021-10-06 DIAGNOSIS — O34211 Maternal care for low transverse scar from previous cesarean delivery: Secondary | ICD-10-CM

## 2021-10-06 DIAGNOSIS — Z3A40 40 weeks gestation of pregnancy: Secondary | ICD-10-CM

## 2021-10-06 SURGERY — Surgical Case
Anesthesia: Epidural | Site: Abdomen | Wound class: Clean Contaminated

## 2021-10-06 MED ORDER — SODIUM CHLORIDE 0.9 % IV SOLN
500.0000 mg | INTRAVENOUS | Status: AC
  Administered 2021-10-06: 500 mg via INTRAVENOUS

## 2021-10-06 MED ORDER — MORPHINE SULFATE (PF) 0.5 MG/ML IJ SOLN
INTRAMUSCULAR | Status: AC
  Filled 2021-10-06: qty 10

## 2021-10-06 MED ORDER — CEFAZOLIN IN SODIUM CHLORIDE 3-0.9 GM/100ML-% IV SOLN
3.0000 g | INTRAVENOUS | Status: AC
  Administered 2021-10-06: 3 g via INTRAVENOUS
  Filled 2021-10-06 (×2): qty 100

## 2021-10-06 MED ORDER — DEXAMETHASONE SODIUM PHOSPHATE 4 MG/ML IJ SOLN
INTRAMUSCULAR | Status: DC | PRN
  Administered 2021-10-06: 4 mg via INTRAVENOUS

## 2021-10-06 MED ORDER — SOD CITRATE-CITRIC ACID 500-334 MG/5ML PO SOLN
30.0000 mL | ORAL | Status: AC
  Administered 2021-10-06: 30 mL via ORAL

## 2021-10-06 MED ORDER — DEXAMETHASONE SODIUM PHOSPHATE 4 MG/ML IJ SOLN
INTRAMUSCULAR | Status: AC
  Filled 2021-10-06: qty 1

## 2021-10-06 MED ORDER — SODIUM CHLORIDE 0.9 % IR SOLN
Status: DC | PRN
  Administered 2021-10-06: 1000 mL

## 2021-10-06 MED ORDER — ONDANSETRON HCL 4 MG/2ML IJ SOLN
INTRAMUSCULAR | Status: DC | PRN
  Administered 2021-10-06: 4 mg via INTRAVENOUS

## 2021-10-06 MED ORDER — PHENYLEPHRINE HCL (PRESSORS) 10 MG/ML IV SOLN
INTRAVENOUS | Status: DC | PRN
  Administered 2021-10-06: 80 ug via INTRAVENOUS
  Administered 2021-10-06: 120 ug via INTRAVENOUS

## 2021-10-06 MED ORDER — MORPHINE SULFATE (PF) 0.5 MG/ML IJ SOLN
INTRAMUSCULAR | Status: DC | PRN
  Administered 2021-10-06: 3 mg via EPIDURAL

## 2021-10-06 MED ORDER — LIDOCAINE-EPINEPHRINE (PF) 2 %-1:200000 IJ SOLN
INTRAMUSCULAR | Status: DC | PRN
  Administered 2021-10-06 (×2): 5 mL via EPIDURAL

## 2021-10-06 MED ORDER — FENTANYL CITRATE (PF) 100 MCG/2ML IJ SOLN
INTRAMUSCULAR | Status: DC | PRN
  Administered 2021-10-06: 100 ug via EPIDURAL

## 2021-10-06 MED ORDER — OXYTOCIN-SODIUM CHLORIDE 30-0.9 UT/500ML-% IV SOLN
INTRAVENOUS | Status: DC | PRN
  Administered 2021-10-06 – 2021-10-07 (×2): 400 mL via INTRAVENOUS

## 2021-10-06 MED ORDER — LACTATED RINGERS IV SOLN: INTRAVENOUS | Status: DC | PRN

## 2021-10-06 MED ORDER — ONDANSETRON HCL 4 MG/2ML IJ SOLN
INTRAMUSCULAR | Status: AC
  Filled 2021-10-06: qty 2

## 2021-10-06 MED ORDER — CEFAZOLIN IN SODIUM CHLORIDE 3-0.9 GM/100ML-% IV SOLN
INTRAVENOUS | Status: AC
  Filled 2021-10-06: qty 100

## 2021-10-06 MED ORDER — OXYTOCIN-SODIUM CHLORIDE 30-0.9 UT/500ML-% IV SOLN: 1.0000 m[IU]/min | INTRAVENOUS | Status: DC

## 2021-10-06 MED ORDER — LIDOCAINE-EPINEPHRINE (PF) 2 %-1:200000 IJ SOLN
INTRAMUSCULAR | Status: AC
  Filled 2021-10-06: qty 20

## 2021-10-06 MED ORDER — OXYTOCIN-SODIUM CHLORIDE 30-0.9 UT/500ML-% IV SOLN
INTRAVENOUS | Status: AC
  Filled 2021-10-06: qty 500

## 2021-10-06 MED ORDER — FENTANYL CITRATE (PF) 100 MCG/2ML IJ SOLN
INTRAMUSCULAR | Status: AC
  Filled 2021-10-06: qty 2

## 2021-10-06 SURGICAL SUPPLY — 43 items
BENZOIN TINCTURE PRP APPL 2/3 (GAUZE/BANDAGES/DRESSINGS) ×2 IMPLANT
CANISTER SUCT 3000ML PPV (MISCELLANEOUS) ×2 IMPLANT
CANISTER WOUND CARE 500ML ATS (WOUND CARE) ×2 IMPLANT
CHLORAPREP W/TINT 26ML (MISCELLANEOUS) ×2 IMPLANT
DRSG OPSITE POSTOP 4X10 (GAUZE/BANDAGES/DRESSINGS) ×2 IMPLANT
ELECT REM PT RETURN 9FT ADLT (ELECTROSURGICAL) ×2
ELECTRODE REM PT RTRN 9FT ADLT (ELECTROSURGICAL) ×1 IMPLANT
EXTENDER TRAXI PANNICULUS (MISCELLANEOUS) ×1 IMPLANT
EXTRACTOR VACUUM KIWI (MISCELLANEOUS) ×2 IMPLANT
GLOVE BIOGEL PI IND STRL 7.0 (GLOVE) ×2 IMPLANT
GLOVE BIOGEL PI IND STRL 7.5 (GLOVE) ×1 IMPLANT
GLOVE BIOGEL PI INDICATOR 7.0 (GLOVE) ×2
GLOVE BIOGEL PI INDICATOR 7.5 (GLOVE) ×1
GLOVE SKINSENSE NS SZ7.0 (GLOVE) ×1
GLOVE SKINSENSE STRL SZ7.0 (GLOVE) ×1 IMPLANT
GOWN STRL REUS W/ TWL LRG LVL3 (GOWN DISPOSABLE) ×2 IMPLANT
GOWN STRL REUS W/ TWL XL LVL3 (GOWN DISPOSABLE) ×1 IMPLANT
GOWN STRL REUS W/TWL LRG LVL3 (GOWN DISPOSABLE) ×4
GOWN STRL REUS W/TWL XL LVL3 (GOWN DISPOSABLE) ×2
HOVERMATT SINGLE USE (MISCELLANEOUS) ×2 IMPLANT
NS IRRIG 1000ML POUR BTL (IV SOLUTION) ×2 IMPLANT
PACK C SECTION WH (CUSTOM PROCEDURE TRAY) ×2 IMPLANT
PAD ABD 7.5X8 STRL (GAUZE/BANDAGES/DRESSINGS) ×2 IMPLANT
PAD OB MATERNITY 4.3X12.25 (PERSONAL CARE ITEMS) ×2 IMPLANT
PAD PREP 24X48 CUFFED NSTRL (MISCELLANEOUS) ×2 IMPLANT
PENCIL SMOKE EVAC W/HOLSTER (ELECTROSURGICAL) ×2 IMPLANT
RETRACTOR TRAXI PANNICULUS (MISCELLANEOUS) ×1 IMPLANT
STRIP CLOSURE SKIN 1/2X4 (GAUZE/BANDAGES/DRESSINGS) ×2 IMPLANT
SUT MNCRL 0 VIOLET CTX 36 (SUTURE) ×3 IMPLANT
SUT MON AB 4-0 PS1 27 (SUTURE) ×4 IMPLANT
SUT MON AB-0 CT1 36 (SUTURE) ×2 IMPLANT
SUT MONOCRYL 0 CTX 36 (SUTURE) ×6
SUT PLAIN 2 0 XLH (SUTURE) ×4 IMPLANT
SUT VIC AB 0 CT1 36 (SUTURE) ×4 IMPLANT
SUT VIC AB 2-0 CT1 27 (SUTURE) ×2
SUT VIC AB 2-0 CT1 TAPERPNT 27 (SUTURE) ×1 IMPLANT
SUT VIC AB 3-0 CT1 27 (SUTURE) ×2
SUT VIC AB 3-0 CT1 TAPERPNT 27 (SUTURE) ×1 IMPLANT
SUT VIC AB 4-0 KS 27 (SUTURE) ×2 IMPLANT
TOWEL OR 17X24 6PK STRL BLUE (TOWEL DISPOSABLE) ×4 IMPLANT
TRAXI PANNICULUS EXTENDER (MISCELLANEOUS) ×1
TRAXI PANNICULUS RETRACTOR (MISCELLANEOUS) ×1
WATER STERILE IRR 1000ML POUR (IV SOLUTION) ×2 IMPLANT

## 2021-10-06 NOTE — Progress Notes (Signed)
Marena Witts Katona is a 30 y.o. G2P1001 at [redacted]w[redacted]d.  Subjective: Comfortable w/ epidrual  Objective: BP 119/74   Pulse 70   Temp 98.2 F (36.8 C) (Oral)   Resp 18   Ht 5\' 2"  (1.575 m)   Wt 125 kg   LMP 12/29/2020 (Exact Date)   BMI 50.40 kg/m    FHT:  FHR: 125 bpm, variability: mod,  accelerations:  15x15,  decelerations:  variables and occasional late decels that improve w/ position changes, amnioinfusion. UC:   Q 2-7 minutes, strong, MVU's 210-280 Dilation: 4.5, caput noted.  Effacement (%): 50 Cervical Position: Anterior Station: -1 Presentation: Vertex Exam by:: [email or web address], CNM  Labs: No results found for this or any previous visit (from the past 24 hour(s)).  Assessment / Plan: [redacted]w[redacted]d week IUP Labor: Protracted latent phase. Mostly adequate UC's, but episodes of coupleting, inadequate contractions. Not able to increase pitocin due to tracing.  Fetal Wellbeing:  Category II, overall reassuring and improving w/ interventions.  Pain Control:  Epidural.  Anticipated MOD:  Uncertain. Not progressing w/ mostly adequate contractions and unable to increase pitocin due to FHR tracing. Updated Dr. [redacted]w[redacted]d. Will come discuss labor progress, FHR tracing w/ pt and family.    Vergie Living, Katrinka Blazing, CNM 10/06/2021 2:21 PM

## 2021-10-06 NOTE — Progress Notes (Signed)
OB Note Cervix unchanged. Category II due to persistent mild late decels q4-80m UCs. D/w pt and she is amenable to c-section for arrest of dilation. D/c pitocin. Ancef and azithro for abx and prevena for incision.   Cornelia Copa MD Attending Center for [employer] (Faculty Practice) 10/06/2021 Time: 2237

## 2021-10-06 NOTE — Progress Notes (Signed)
L&D Note  10/06/2021 - 2:52 PM  30 y.o. G2P1001 [redacted]w[redacted]d. Pregnancy complicated by BMI 50s, h/o scheduled pLTCS in 2011 due to presumed macrosomia  Patient Active Problem List   Diagnosis Date Noted   Asthma complicating pregnancy, antepartum 06/17/2021   Morbid obesity (HCC) 06/17/2021   Syncope 04/12/2021   Supervision of high risk pregnancy, antepartum 03/20/2021   Asthma 08/31/2020   Back pain 02/13/2020   Hiatal hernia 11/02/2019   Epigastric pain 09/13/2019   Obesity complicating pregnancy 08/19/2019   Subclinical hypothyroidism 05/19/2019   GAD (generalized anxiety disorder) 05/09/2019   GERD (gastroesophageal reflux disease) 03/28/2011   BMI 50.0-59.9, adult (HCC) 02/27/2011   Anemia 02/27/2011    Ms. Merlene Laughter Cafarella is admitted for IOL at 40wks   Subjective:  Patient comfortable with epidural in place AI continues and pt with scalp electrode in place and repositioning needed to help with category II tracing.  Objective:    Current Vital Signs 24h Vital Sign Ranges  T 98.2 F (36.8 C) Temp  Avg: 98.3 F (36.8 C)  Min: 97.8 F (36.6 C)  Max: 98.7 F (37.1 C)  BP 119/74 BP  Min: 91/45  Max: 151/85  HR 70 Pulse  Avg: 81.7  Min: 70  Max: 105  RR 18 Resp  Avg: 18  Min: 16  Max: 20  SaO2     No data recorded       24 Hour I/O Current Shift I/O  Time Ins Outs No intake/output data recorded. 10/30 0701 - 10/30 1900 In: -  Out: 2000 [Urine:2000]   FHR: 130 baseline, +accels, occasional subtle lates and occasional slight variables, mod variability Toco: q2-10m Gen: NAD SVE: deferred but unchanged at 4-5/50/-1 but now with caput noted at 1400 by CNM Smith  Labs:  Recent Labs  Lab 10/04/21 1217  WBC 8.1  HGB 11.2*  HCT 33.3*  PLT 272   AB POS   Medications Current Facility-Administered Medications  Medication Dose Route Frequency Provider Last Rate Last Admin   acetaminophen (TYLENOL) tablet 650 mg  650 mg Oral Q4H PRN Cam Hai D, CNM        butorphanol (STADOL) injection 2 mg  2 mg Intravenous Once Adam Phenix, MD       diphenhydrAMINE (BENADRYL) injection 12.5 mg  12.5 mg Intravenous Q15 min PRN Bethena Midget, MD       ePHEDrine injection 10 mg  10 mg Intravenous PRN Bethena Midget, MD       ePHEDrine injection 10 mg  10 mg Intravenous PRN Bethena Midget, MD       fentaNYL (SUBLIMAZE) injection 100 mcg  100 mcg Intravenous Q1H PRN Cam Hai D, CNM   100 mcg at 10/05/21 1210   fentaNYL 2 mcg/mL w/ bupivacaine 0.125% in NS 250 mL epidural infusion  12 mL/hr Epidural Continuous PRN Bethena Midget, MD 12 mL/hr at 10/06/21 1419 12 mL/hr at 10/06/21 1419   hydrOXYzine (ATARAX/VISTARIL) tablet 10 mg  10 mg Oral TID PRN Sharen Counter A, CNM   10 mg at 10/05/21 2033   lactated ringers infusion 500-1,000 mL  500-1,000 mL Intravenous PRN Arabella Merles, CNM       lactated ringers infusion   Intravenous Continuous Aundrea, Horace, CNM 125 mL/hr at 10/06/21 0201 New Bag at 10/06/21 0201   lidocaine (PF) (XYLOCAINE) 1 % injection 30 mL  30 mL Subcutaneous PRN Arabella Merles, CNM       ondansetron [facility]) injection 4 mg  4 mg Intravenous Q6H PRN Arabella Merles, CNM   4 mg at 10/05/21 1919   oxytocin (PITOCIN) IV BOLUS FROM BAG  333 mL Intravenous Once Arabella Merles, CNM       oxytocin (PITOCIN) IV infusion 30 units in NS 500 mL - Premix  2.5 Units/hr Intravenous Continuous Arabella Merles, CNM       oxytocin (PITOCIN) IV infusion 30 units in NS 500 mL - Premix  1-40 milli-units/min Intravenous Titrated Leticia Penna N, DO 8 mL/hr at 10/06/21 0530 8 milli-units/min at 10/06/21 0530   pantoprazole (PROTONIX) EC tablet 40 mg  40 mg Oral Daily Worthy Rancher, MD   40 mg at 10/06/21 1225   penicillin G potassium 5 Million Units in sodium chloride 0.9 % 250 mL IVPB  5 Million Units Intravenous Once Cam Hai D, CNM       Followed by   penicillin G potassium 3 Million Units in dextrose 66mL IVPB  3 Million Units  Intravenous Q4H Sher, Shampine, CNM 100 mL/hr at 10/06/21 1400 3 Million Units at 10/06/21 1400   PHENYLephrine 40 mcg/ml in normal saline Adult IV Push Syringe (For Blood Pressure Support)  80 mcg Intravenous PRN Bethena Midget, MD       PHENYLephrine 40 mcg/ml in normal saline Adult IV Push Syringe (For Blood Pressure Support)  80 mcg Intravenous PRN Bethena Midget, MD       sodium citrate-citric acid (ORACIT) solution 30 mL  30 mL Oral Q2H PRN Arabella Merles, CNM   30 mL at 10/06/21 9892   terbutaline (BRETHINE) injection 0.25 mg  0.25 mg Subcutaneous Once PRN Leftwich-Kirby, Wilmer Floor, CNM       Facility-Administered Medications Ordered in Other Encounters  Medication Dose Route Frequency Provider Last Rate Last Admin   lidocaine (PF) (XYLOCAINE) 1 % injection   Epidural Anesthesia Rockey Situ, MD   10 mL at 10/05/21 1943    Assessment & Plan:  Pt stable *IUP: category I to II but with normal baseline, mod variability and accels *IOL: I d/w her that I'm concerned re: her chances at vaginal delivery and that I recommend a rpt c-section because of her cervix being essentially unchanged since 0430 and SROM with light mec at 0500. I think she'll end up with a rpt c-section in the end but right now the baby's heart rate looks okay so it wouldn't be an emergency, as if the FHR started to have worse issues.  I told her that if she would like to give it more time to see if she goes into active labor then we can re-assess her in a few hours as long as the FHR looks okay. Pt would like to do the latter -continue amnioinfusion; scalp electrode currently in place *GBS: pos. S/p at least two doses of PCN *Analgesia: epidural working well  [place] Bing, Montez Hageman. MD Attending Center for [employer] [facility])

## 2021-10-06 NOTE — Progress Notes (Signed)
Labor Progress Note Veronica [name] is a 31 y.o. G2P1001 at [redacted]w[redacted]d admitted for eIOL and TOLAC. S: Resting comfortably in bed. Epidural in place.  O:  BP 129/70   Pulse 76   Temp 97.8 F (36.6 C) (Axillary)   Resp 18   Ht 5\' 2"  (1.575 m)   Wt 125 kg   LMP 12/29/2020 (Exact Date)   BMI 50.40 kg/m  EFM: baseline 130/ moderate variability /accelerations present; no decels  CVE: Dilation: 4 Effacement (%): 70, 80 Cervical Position: Anterior Station: -2 Presentation: Vertex Exam by:: Dr. [email or web address]   A&P: 30 y.o. G2P1001 [redacted]w[redacted]d  admitted for eIOL and TOLAC. #Labor: Progressing well. Foley fell out. Will start pitocin. #Pain: Epidural #FWB: Category I strip #GBS positive. On penicillin. #Anxiety - home zoloft ordered and prn hydroxyzine ordered   [redacted]w[redacted]d, MD 4:46 AM

## 2021-10-06 NOTE — Progress Notes (Signed)
Veronica [name] is a 30 y.o. G2P1001 at [redacted]w[redacted]d.  Subjective: Right able pain w/ UC's/   Objective: BP 119/74   Pulse 70   Temp 98.2 F (36.8 C) (Oral)   Resp 18   Ht 5\' 2"  (1.575 m)   Wt 125 kg   LMP 12/29/2020 (Exact Date)   BMI 50.40 kg/m    FHT:  FHR: 125 bpm, variability: mod,  accelerations:  15x15,  decelerations:  occasional variables UC: Difficult to trace.  Q 2-3 minutes, mod. Dilation: 4.5 Effacement (%): 50 Cervical Position: Anterior Station: -1 Presentation: Vertex Exam by:: [email or web address], CNM IUPC and FSE tracing w/ out difficulty   Labs: No results found for this or any previous visit (from the past 24 hour(s)).  Assessment / Plan: [redacted]w[redacted]d week IUP Labor: Protracted latent phase. Pt SROM'd light MSF at 0515. IUPC now in place. Titrate pitocin to achieve adequate labor.   Fetal Wellbeing:  Category I-II, overall reassuring  Pain Control:  Epidural  Anticipated MOD:  Uncertain. Will CTO labor progress closely. Hope to see change now that pt is ruptured.   [redacted]w[redacted]d, Katrinka Blazing, CNM 10/06/2021 10:16 AM

## 2021-10-06 NOTE — Progress Notes (Signed)
Veronica [name] is a 30 y.o. G2P1001 at 110w1d.  Subjective: Moderate rectal pressure w/ UC's.    Objective: BP 103/64   Pulse 85   Temp 98.4 F (36.9 C) (Oral)   Resp 18   Ht 5\' 2"  (1.575 m)   Wt 125 kg   LMP 12/29/2020 (Exact Date)   BMI 50.40 kg/m    FHT:  FHR: 125 bpm, variability: mod,  accelerations:  1515,  decelerations:  variables and occasional late decels. UC:   Q 2-6 minutes, strong, MVU's 270-310.  Dilation: 5.5 Effacement (%): 80 Cervical Position: Anterior Station: -1 Presentation: Vertex Exam by:: [email or web address] return from Amnioinfusion. FSE replaced due to poor tracing w/ lots of artifact.   Labs: No results found for this or any previous visit (from the past 24 hour(s)).  Assessment / Plan: [redacted]w[redacted]d week IUP Labor: Progress since last VE. Asynclitism may have resolved.  After last VE Dr. [redacted]w[redacted]d came ot [facility] and discussed slow labor progress some concern for intermittent decels (although FHR overall reassuring) and possible C/S. No strong indication, but would prefer not to go under emergent circumstances due to BMI 50. Pt wants to continue TOLAC. Had difficult recovery last time.  Fetal Wellbeing:  Category I-II, overall reassuring.  Pain Control:  Epidural. Pain improved w/ PCA.  Anticipated MOD:  Unsure, but reassured by cervical change.   [facility], Katrinka Blazing, [place] 10/06/2021 5:37 PM

## 2021-10-06 NOTE — Progress Notes (Signed)
Labor Progress Note Corleen Otwell Barron is a 30 y.o. G2P1001 at [redacted]w[redacted]d presented for eIOL.   S: Doing well, not feeling any contractions.   O:  BP 118/62   Pulse 70   Temp 97.8 F (36.6 C) (Axillary)   Resp 18   Ht 5\' 2"  (1.575 m)   Wt 125 kg   LMP 12/29/2020 (Exact Date)   BMI 50.40 kg/m  EFM: 125/mod/15x15/none  No UC seen.   CVE: Dilation: 3 Cervical Position: Posterior  Presentation: Vertex Exam by:: Dr. [email or web address]   A&P: 30 y.o. G2P1001 [redacted]w[redacted]d  #Labor: Checked since it had been quite some time with FB in place. Both foley balloons within cervix, deflated vaginal balloon and pulled cervical one to engage with cervix. Cervix feels about 3, however stretched with balloon behind. Will add back on pit 1x1.  #Pain: Epidural in place   #FWB: Cat 1  #GBS positive PCN    [redacted]w[redacted]d, DO 1:09 AM

## 2021-10-06 NOTE — Progress Notes (Signed)
L&D Note  10/06/2021 - 8:39 PM  30 y.o. G2P1001 [redacted]w[redacted]d. Pregnancy complicated by BMI 50s, h/o scheduled pLTCS in 2011 due to presumed macrosomia  Patient Active Problem List   Diagnosis Date Noted   Asthma complicating pregnancy, antepartum 06/17/2021   Morbid obesity (HCC) 06/17/2021   Syncope 04/12/2021   Supervision of high risk pregnancy, antepartum 03/20/2021   Asthma 08/31/2020   Back pain 02/13/2020   Hiatal hernia 11/02/2019   Epigastric pain 09/13/2019   Obesity complicating pregnancy 08/19/2019   Subclinical hypothyroidism 05/19/2019   GAD (generalized anxiety disorder) 05/09/2019   GERD (gastroesophageal reflux disease) 03/28/2011   BMI 50.0-59.9, adult (HCC) 02/27/2011   Anemia 02/27/2011    Ms. Veronica [name] is admitted for IOL at 40wks   Subjective:  Patient comfortable with epidural in place AI continues and patient had scalp electrode replaced. Objective:    Current Vital Signs 24h Vital Sign Ranges  T 98.6 F (37 C) Temp  Avg: 98.4 F (36.9 C)  Min: 98.2 F (36.8 C)  Max: 98.7 F (37.1 C)  BP 122/72 BP  Min: 91/45  Max: 151/85  HR 89 Pulse  Avg: 81.9  Min: 70  Max: 100  RR 18 Resp  Avg: 18.1  Min: 16  Max: 20  SaO2     No data recorded       24 Hour I/O Current Shift I/O  Time Ins Outs No intake/output data recorded. 10/30 1901 - 10/31 0700 In: -  Out: 1200 [Urine:1200]   FHR: 120 baseline, ?rare accels, occasional subtle lates and occasional slight variables, mod variability Toco: q2-40m Gen: NAD SVE: 5 to 5-6/50/ballotable (out of pelvis) (unchanged since 1700), belly looks OP  Labs:  Recent Labs  Lab 10/04/21 1217  WBC 8.1  HGB 11.2*  HCT 33.3*  PLT 272    AB POS   Medications Current Facility-Administered Medications  Medication Dose Route Frequency Provider Last Rate Last Admin   acetaminophen (TYLENOL) tablet 650 mg  650 mg Oral Q4H PRN Cam Hai D, CNM       butorphanol (STADOL) injection 2 mg  2 mg Intravenous Once  Adam Phenix, MD       diphenhydrAMINE (BENADRYL) injection 12.5 mg  12.5 mg Intravenous Q15 min PRN Bethena Midget, MD       ePHEDrine injection 10 mg  10 mg Intravenous PRN Bethena Midget, MD       ePHEDrine injection 10 mg  10 mg Intravenous PRN Bethena Midget, MD       fentaNYL (SUBLIMAZE) injection 100 mcg  100 mcg Intravenous Q1H PRN Cam Hai D, CNM   100 mcg at 10/05/21 1210   fentaNYL 2 mcg/mL w/ bupivacaine 0.125% in NS 250 mL epidural infusion  12 mL/hr Epidural Continuous PRN Bethena Midget, MD 12 mL/hr at 10/06/21 1419 12 mL/hr at 10/06/21 1419   hydrOXYzine (ATARAX/VISTARIL) tablet 10 mg  10 mg Oral TID PRN Sharen Counter A, CNM   10 mg at 10/05/21 2033   lactated ringers infusion 500-1,000 mL  500-1,000 mL Intravenous PRN Arabella Merles, CNM       lactated ringers infusion   Intravenous Continuous Loree, Shehata, CNM 125 mL/hr at 10/06/21 0201 New Bag at 10/06/21 0201   lidocaine (PF) (XYLOCAINE) 1 % injection 30 mL  30 mL Subcutaneous PRN Arabella Merles, CNM       ondansetron [facility]) injection 4 mg  4 mg Intravenous Q6H PRN Arabella Merles, CNM  4 mg at 10/05/21 1919   oxytocin (PITOCIN) IV BOLUS FROM BAG  333 mL Intravenous Once Arabella Merles, CNM       oxytocin (PITOCIN) IV infusion 30 units in NS 500 mL - Premix  2.5 Units/hr Intravenous Continuous Arabella Merles, CNM       oxytocin (PITOCIN) IV infusion 30 units in NS 500 mL - Premix  1-40 milli-units/min Intravenous Titrated Leticia Penna N, DO 6 mL/hr at 10/06/21 1725 6 milli-units/min at 10/06/21 1725   pantoprazole (PROTONIX) EC tablet 40 mg  40 mg Oral Daily Worthy Rancher, MD   40 mg at 10/06/21 1225   penicillin G potassium 5 Million Units in sodium chloride 0.9 % 250 mL IVPB  5 Million Units Intravenous Once Cam Hai D, CNM       Followed by   penicillin G potassium 3 Million Units in dextrose 5mL IVPB  3 Million Units Intravenous Q4H Peta, Peachey, CNM 100 mL/hr at 10/06/21  1805 3 Million Units at 10/06/21 1805   PHENYLephrine 40 mcg/ml in normal saline Adult IV Push Syringe (For Blood Pressure Support)  80 mcg Intravenous PRN Bethena Midget, MD       PHENYLephrine 40 mcg/ml in normal saline Adult IV Push Syringe (For Blood Pressure Support)  80 mcg Intravenous PRN Bethena Midget, MD       sodium citrate-citric acid (ORACIT) solution 30 mL  30 mL Oral Q2H PRN Arabella Merles, CNM   30 mL at 10/06/21 7425   terbutaline (BRETHINE) injection 0.25 mg  0.25 mg Subcutaneous Once PRN Leftwich-Kirby, Wilmer Floor, CNM       Facility-Administered Medications Ordered in Other Encounters  Medication Dose Route Frequency Provider Last Rate Last Admin   lidocaine (PF) (XYLOCAINE) 1 % injection   Epidural Anesthesia Intra-op Bethena Midget, MD   10 mL at 10/05/21 1943    Assessment & Plan:  Pt stable *IUP: category I to II but with normal baseline, mod variability and accels *IOL: I d/w her again that I'm not optimistic regarding her chances for a vaginal delivery and said that the FHR looks okay but is still having occasional decels and needing to leave mom on right side. Cervix has been 4-5 since this morning and 5-6 at 1700, but baby is not well engaged in the pelvis.  I told her I still recommend a c-section but she would like to hold off. Plan to re-check at 2200.  -continue amnioinfusion; scalp electrode currently in place *GBS: pos. S/p at least two doses of PCN *Analgesia: epidural working well  [place] Bing, Montez Hageman. MD Attending Center for [employer] [facility])

## 2021-10-07 ENCOUNTER — Encounter (HOSPITAL_COMMUNITY): Payer: Self-pay | Admitting: Family Medicine

## 2021-10-07 DIAGNOSIS — O34219 Maternal care for unspecified type scar from previous cesarean delivery: Secondary | ICD-10-CM

## 2021-10-07 LAB — CBC
HCT: 31.7 % — ABNORMAL LOW (ref 36.0–46.0)
Hemoglobin: 10.6 g/dL — ABNORMAL LOW (ref 12.0–15.0)
MCH: 27 pg (ref 26.0–34.0)
MCHC: 33.4 g/dL (ref 30.0–36.0)
MCV: 80.7 fL (ref 80.0–100.0)
Platelets: 248 10*3/uL (ref 150–400)
RBC: 3.93 MIL/uL (ref 3.87–5.11)
RDW: 16.7 % — ABNORMAL HIGH (ref 11.5–15.5)
WBC: 16.9 10*3/uL — ABNORMAL HIGH (ref 4.0–10.5)
nRBC: 0 % (ref 0.0–0.2)

## 2021-10-07 MED ORDER — KETOROLAC TROMETHAMINE 30 MG/ML IJ SOLN
30.0000 mg | Freq: Four times a day (QID) | INTRAMUSCULAR | Status: DC | PRN
Start: 2021-10-07 — End: 2021-10-07
  Administered 2021-10-07: 30 mg via INTRAVENOUS

## 2021-10-07 MED ORDER — DIPHENHYDRAMINE HCL 50 MG/ML IJ SOLN: 12.5000 mg | Freq: Four times a day (QID) | INTRAMUSCULAR | Status: DC | PRN

## 2021-10-07 MED ORDER — OXYTOCIN-SODIUM CHLORIDE 30-0.9 UT/500ML-% IV SOLN
INTRAVENOUS | Status: AC
  Filled 2021-10-07: qty 500

## 2021-10-07 MED ORDER — ONDANSETRON HCL 4 MG/2ML IJ SOLN: 4.0000 mg | Freq: Three times a day (TID) | INTRAMUSCULAR | Status: DC | PRN

## 2021-10-07 MED ORDER — SIMETHICONE 80 MG PO CHEW
80.0000 mg | CHEWABLE_TABLET | Freq: Three times a day (TID) | ORAL | Status: DC
  Administered 2021-10-07 – 2021-10-09 (×7): 80 mg via ORAL
  Filled 2021-10-07 (×7): qty 1

## 2021-10-07 MED ORDER — OXYTOCIN-SODIUM CHLORIDE 30-0.9 UT/500ML-% IV SOLN: 2.5000 [IU]/h | INTRAVENOUS | Status: AC

## 2021-10-07 MED ORDER — ACETAMINOPHEN 500 MG PO TABS
1000.0000 mg | ORAL_TABLET | Freq: Four times a day (QID) | ORAL | Status: DC
  Administered 2021-10-07 – 2021-10-09 (×8): 1000 mg via ORAL
  Filled 2021-10-07 (×8): qty 2

## 2021-10-07 MED ORDER — DIBUCAINE (PERIANAL) 1 % EX OINT: 1.0000 "application " | TOPICAL_OINTMENT | CUTANEOUS | Status: DC | PRN

## 2021-10-07 MED ORDER — PANTOPRAZOLE SODIUM 40 MG PO TBEC
40.0000 mg | DELAYED_RELEASE_TABLET | Freq: Every day | ORAL | Status: DC
  Administered 2021-10-07 – 2021-10-09 (×3): 40 mg via ORAL
  Filled 2021-10-07 (×3): qty 1

## 2021-10-07 MED ORDER — SIMETHICONE 80 MG PO CHEW: 80.0000 mg | CHEWABLE_TABLET | ORAL | Status: DC | PRN

## 2021-10-07 MED ORDER — WITCH HAZEL-GLYCERIN EX PADS: 1.0000 "application " | MEDICATED_PAD | CUTANEOUS | Status: DC | PRN

## 2021-10-07 MED ORDER — ENOXAPARIN SODIUM 40 MG/0.4ML IJ SOSY: 40.0000 mg | PREFILLED_SYRINGE | INTRAMUSCULAR | Status: DC

## 2021-10-07 MED ORDER — FENTANYL CITRATE (PF) 100 MCG/2ML IJ SOLN: 25.0000 ug | INTRAMUSCULAR | Status: DC | PRN

## 2021-10-07 MED ORDER — TRANEXAMIC ACID-NACL 1000-0.7 MG/100ML-% IV SOLN
INTRAVENOUS | Status: AC
  Filled 2021-10-07: qty 100

## 2021-10-07 MED ORDER — MAGNESIUM HYDROXIDE 400 MG/5ML PO SUSP: 30.0000 mL | ORAL | Status: DC | PRN

## 2021-10-07 MED ORDER — LACTATED RINGERS IV SOLN: INTRAVENOUS | Status: DC

## 2021-10-07 MED ORDER — MENTHOL 3 MG MT LOZG: 1.0000 | LOZENGE | OROMUCOSAL | Status: DC | PRN

## 2021-10-07 MED ORDER — PROMETHAZINE HCL 25 MG/ML IJ SOLN
6.2500 mg | INTRAMUSCULAR | Status: DC | PRN
Start: 2021-10-07 — End: 2021-10-07

## 2021-10-07 MED ORDER — ACETAMINOPHEN 500 MG PO TABS: 1000.0000 mg | ORAL_TABLET | Freq: Four times a day (QID) | ORAL | Status: DC

## 2021-10-07 MED ORDER — SENNOSIDES-DOCUSATE SODIUM 8.6-50 MG PO TABS
2.0000 | ORAL_TABLET | Freq: Every day | ORAL | Status: DC
  Administered 2021-10-08 – 2021-10-09 (×2): 2 via ORAL
  Filled 2021-10-07 (×2): qty 2

## 2021-10-07 MED ORDER — NALBUPHINE HCL 10 MG/ML IJ SOLN: 5.0000 mg | INTRAMUSCULAR | Status: DC | PRN

## 2021-10-07 MED ORDER — KETOROLAC TROMETHAMINE 30 MG/ML IJ SOLN
INTRAMUSCULAR | Status: AC
  Filled 2021-10-07: qty 1

## 2021-10-07 MED ORDER — KETOROLAC TROMETHAMINE 30 MG/ML IJ SOLN
30.0000 mg | Freq: Four times a day (QID) | INTRAMUSCULAR | Status: AC
  Administered 2021-10-07 – 2021-10-08 (×4): 30 mg via INTRAVENOUS
  Filled 2021-10-07 (×4): qty 1

## 2021-10-07 MED ORDER — FLUTICASONE PROPIONATE HFA 44 MCG/ACT IN AERO
2.0000 | INHALATION_SPRAY | Freq: Two times a day (BID) | RESPIRATORY_TRACT | Status: DC
  Administered 2021-10-07 – 2021-10-09 (×5): 2 via RESPIRATORY_TRACT

## 2021-10-07 MED ORDER — OXYCODONE HCL 5 MG PO TABS: 5.0000 mg | ORAL_TABLET | ORAL | Status: DC | PRN

## 2021-10-07 MED ORDER — DIPHENHYDRAMINE HCL 25 MG PO CAPS: 25.0000 mg | ORAL_CAPSULE | Freq: Four times a day (QID) | ORAL | Status: DC | PRN

## 2021-10-07 MED ORDER — TETANUS-DIPHTH-ACELL PERTUSSIS 5-2.5-18.5 LF-MCG/0.5 IM SUSY: 0.5000 mL | PREFILLED_SYRINGE | Freq: Once | INTRAMUSCULAR | Status: DC

## 2021-10-07 MED ORDER — KETOROLAC TROMETHAMINE 30 MG/ML IJ SOLN: 30.0000 mg | Freq: Four times a day (QID) | INTRAMUSCULAR | Status: DC | PRN

## 2021-10-07 MED ORDER — ALBUTEROL SULFATE (2.5 MG/3ML) 0.083% IN NEBU: 2.5000 mg | INHALATION_SOLUTION | RESPIRATORY_TRACT | Status: DC | PRN

## 2021-10-07 MED ORDER — TRANEXAMIC ACID-NACL 1000-0.7 MG/100ML-% IV SOLN
INTRAVENOUS | Status: DC | PRN
  Administered 2021-10-07: 1000 mg via INTRAVENOUS

## 2021-10-07 MED ORDER — NALOXONE HCL 4 MG/10ML IJ SOLN
1.0000 ug/kg/h | INTRAVENOUS | Status: DC | PRN
  Filled 2021-10-07: qty 5

## 2021-10-07 MED ORDER — NALOXONE HCL 0.4 MG/ML IJ SOLN: 0.4000 mg | INTRAMUSCULAR | Status: DC | PRN

## 2021-10-07 MED ORDER — NALBUPHINE HCL 10 MG/ML IJ SOLN: 5.0000 mg | Freq: Once | INTRAMUSCULAR | Status: DC | PRN

## 2021-10-07 MED ORDER — GABAPENTIN 100 MG PO CAPS
100.0000 mg | ORAL_CAPSULE | Freq: Every day | ORAL | Status: DC
  Administered 2021-10-08 (×2): 100 mg via ORAL
  Filled 2021-10-07 (×2): qty 1

## 2021-10-07 MED ORDER — COCONUT OIL OIL: 1.0000 "application " | TOPICAL_OIL | Status: DC | PRN

## 2021-10-07 MED ORDER — SERTRALINE HCL 50 MG PO TABS
50.0000 mg | ORAL_TABLET | Freq: Every day | ORAL | Status: DC
  Administered 2021-10-07 – 2021-10-09 (×3): 50 mg via ORAL
  Filled 2021-10-07 (×3): qty 1

## 2021-10-07 MED ORDER — ACETAMINOPHEN 160 MG/5ML PO SOLN: 1000.0000 mg | Freq: Once | ORAL | Status: AC

## 2021-10-07 MED ORDER — ENOXAPARIN SODIUM 60 MG/0.6ML IJ SOSY
60.0000 mg | PREFILLED_SYRINGE | INTRAMUSCULAR | Status: DC
  Administered 2021-10-07 – 2021-10-08 (×2): 60 mg via SUBCUTANEOUS
  Filled 2021-10-07 (×2): qty 0.6

## 2021-10-07 MED ORDER — IBUPROFEN 600 MG PO TABS: 600.0000 mg | ORAL_TABLET | Freq: Four times a day (QID) | ORAL | Status: DC | PRN

## 2021-10-07 MED ORDER — SODIUM CHLORIDE 0.9% FLUSH: 3.0000 mL | INTRAVENOUS | Status: DC | PRN

## 2021-10-07 MED ORDER — MEDROXYPROGESTERONE ACETATE 150 MG/ML IM SUSP: 150.0000 mg | INTRAMUSCULAR | Status: DC | PRN

## 2021-10-07 MED ORDER — KETOROLAC TROMETHAMINE 30 MG/ML IJ SOLN: 30.0000 mg | Freq: Once | INTRAMUSCULAR | Status: DC

## 2021-10-07 MED ORDER — ACETAMINOPHEN 500 MG PO TABS
ORAL_TABLET | ORAL | Status: AC
  Filled 2021-10-07: qty 2

## 2021-10-07 MED ORDER — ACETAMINOPHEN 500 MG PO TABS
1000.0000 mg | ORAL_TABLET | Freq: Once | ORAL | Status: AC
  Administered 2021-10-07: 1000 mg via ORAL

## 2021-10-07 MED ORDER — PRENATAL MULTIVITAMIN CH
1.0000 | ORAL_TABLET | Freq: Every day | ORAL | Status: DC
  Administered 2021-10-07 – 2021-10-08 (×2): 1 via ORAL
  Filled 2021-10-07 (×2): qty 1

## 2021-10-07 MED ORDER — MEASLES, MUMPS & RUBELLA VAC IJ SOLR: 0.5000 mL | Freq: Once | INTRAMUSCULAR | Status: DC

## 2021-10-07 MED ORDER — ALBUTEROL SULFATE HFA 108 (90 BASE) MCG/ACT IN AERS: 1.0000 | INHALATION_SPRAY | RESPIRATORY_TRACT | Status: DC | PRN

## 2021-10-07 NOTE — Op Note (Addendum)
Operative Note   SURGERY DATE: 10/07/2021  PRE-OP DIAGNOSIS:  *Pregnancy at [redacted]w[redacted]d *Arrest of Dilation at 5-6cm  POST-OP DIAGNOSIS: The same    PROCEDURE: stat repeat low transverse cesarean section via Claretha Cooper skin incision with double layer uterine closure  SURGEON: Surgeon(s) and Role:    [place] Bing, MD - Primary    * Allayne Stack, DO - Assisting  ANESTHESIA: epidural  ESTIMATED BLOOD LOSS:  , TXA given intraoperatively  DRAINS: UOP via indwelling foley  TOTAL IV FLUIDS: 2000 mL crystalloid  VTE PROPHYLAXIS: SCDs to bilateral lower extremities  ANTIBIOTICS: Two grams of Cefazolin were given., within 1 hour of skin incision  COMPLICATIONS: None   INDICATIONS: Arrest of dilation at 5.5cm with ROM> 18 hours initially and with persistent lates and variables. Once in the OR fetal heart tones persistently in the 90's when in the OR, transitioned to STAT at that time.    FINDINGS: No intra-abdominal adhesions were noted. Grossly normal uterus, tubes and ovaries. Light meconium amniotic fluid, cephalic female infant, weight 3365gm, APGARs 5/9, intact placenta. Cord gases: arterial 7.17, co2 61.8, venous 7.22, co2 54.6  PROCEDURE IN DETAIL: The patient was taken to the operating room where anesthesia was administered and fetal heart tones were confirmed by Dr. Vergie Living to still be in the 80s-90s, which was different than mom's heart rate. Procedure performed STAT due to indications as above. She was then prepped and draped in the normal fashion in the dorsal supine position with a leftward tilt with betadine  A Claretha Cooper  skin incision was made with the scalpel and carried through to the underlying layer of fascia. The fascia was then incised at the midline and this incision was extended laterally bluntly. The rectus muscles were then separated in the midline and the peritoneum was entered bluntly. The Rich retractor and a bladder blade were inserted to visualize  the uterus.   A low transverse hysterotomy was made with the scalpel until the endometrial cavity was breached, yielding light meconium amniotic fluid. This incision was extended bluntly and the infant's head, shoulders and body were delivered atraumatically. Time from incision to delivery was under two minutes. The cord was clamped x 2 and cut, and the infant was handed to the awaiting pediatricians immediately.  The placenta was then gradually expressed from the uterus and then the uterus was exteriorized and cleared of all clots and debris. The hysterotomy was repaired with a running suture of 1-0 Monocryl. A second imbricating layer of 1-0 Monocryl suture was then placed. Several figure-of-eight sutures were added to achieve excellent hemostasis.   The uterus and adnexa were then returned to the abdomen, and the hysterotomy and all operative sites were reinspected and excellent hemostasis was noted after irrigation and suction of the abdomen with warm saline.  The peritoneum was closed with a running stitch of 3-0 Vicryl. The fascia was reapproximated with 0 Vicryl in a simple running fashion bilaterally. The subcutaneous layer was then reapproximated with interrupted sutures of 2-0 plain gut, and the skin was then closed with 4-0 vicryl, in a subcuticular fashion. A Prevena was placed.   The patient  tolerated the procedure well. Sponge, lap, needle, and instrument counts were correct x 2. The patient was transferred to the recovery room awake, alert and breathing independently in stable condition.  Leticia Penna, DO  OB Fellow Center for [employer] [facility])    Agree with above. I was present and scrubbed for the entire  procedure.   Cornelia Copa MD Attending Center for [employer] [facility])

## 2021-10-07 NOTE — Anesthesia Postprocedure Evaluation (Signed)
Anesthesia Post Note  Patient: Veronica [name]  Procedure(s) Performed: CESAREAN SECTION (Abdomen)     Patient location during evaluation: PACU Anesthesia Type: Epidural Level of consciousness: awake and alert and oriented Pain management: pain level controlled Vital Signs Assessment: post-procedure vital signs reviewed and stable Respiratory status: spontaneous breathing, nonlabored ventilation and respiratory function stable Cardiovascular status: blood pressure returned to baseline Postop Assessment: epidural receding, no apparent nausea or vomiting, no headache and no backache Anesthetic complications: no   No notable events documented.  Last Vitals:  Vitals:   10/07/21 0200 10/07/21 0217  BP:  129/64  Pulse: 78 85  Resp:  18  Temp:  37.4 C  SpO2: 98% 98%    Last Pain:  Vitals:   10/07/21 0217  TempSrc: Oral  PainSc: 0-No pain   Pain Goal:                   Shanda Howells

## 2021-10-07 NOTE — Anesthesia Postprocedure Evaluation (Signed)
Anesthesia Post Note  Patient: Veronica [name]  Procedure(s) Performed: CESAREAN SECTION (Abdomen)     Patient location during evaluation: Mother Baby Anesthesia Type: Epidural Level of consciousness: awake and alert Pain management: pain level controlled Vital Signs Assessment: post-procedure vital signs reviewed and stable Respiratory status: spontaneous breathing, nonlabored ventilation and respiratory function stable Cardiovascular status: stable Postop Assessment: no headache, no backache and epidural receding Anesthetic complications: no   No notable events documented.  Last Vitals:  Vitals:   10/07/21 0532 10/07/21 0858  BP: (!) 147/91 124/63  Pulse: (!) 109 73  Resp:  17  Temp:  36.7 C  SpO2:  100%    Last Pain:  Vitals:   10/07/21 0900  TempSrc:   PainSc: 0-No pain   Pain Goal:                   Murlene Revell

## 2021-10-07 NOTE — Transfer of Care (Signed)
Immediate Anesthesia Transfer of Care Note  Patient: Veronica [name]  Procedure(s) Performed: CESAREAN SECTION (Abdomen)  Patient Location: PACU  Anesthesia Type:Epidural  Level of Consciousness: awake, alert , oriented and patient cooperative  Airway & Oxygen Therapy: Patient Spontanous Breathing  Post-op Assessment: Report given to RN and Post -op Vital signs reviewed and stable  Post vital signs: Reviewed and stable  Last Vitals:  Vitals Value Taken Time  BP 117/97 10/07/21 0103  Temp    Pulse 82 10/07/21 0106  Resp 22 10/07/21 0106  SpO2 99 % 10/07/21 0106  Vitals shown include unvalidated device data.  Last Pain:  Vitals:   10/06/21 2200  TempSrc: Axillary  PainSc: 5          Complications: No notable events documented.

## 2021-10-07 NOTE — Lactation Note (Signed)
This note was copied from a baby's chart. Lactation Consultation Note  Patient Name: Veronica [name] Today's Date: 10/07/2021 Reason for consult: Initial assessment Age:29 hours Per mom, she really wants to breastfeed  her 2nd child longer than her 1st , she only breast fed 1st child for 2 weeks due to latch issues.  [facility] consult entered at 23 hours of life. P2, LC entered the room, mom had infant latched on her left breast in cradle hold but latch was shallow and infant was on nipple tip and when infant released breast nipple tip was pinched.  Mom open to [facility] suggestion to try football hold position, mom re-latched infant on her left breast, infant latched with depth, swallows observed, and infant was still breastfeeding when LC left the room after 15 minutes. Mom knows if she feels pain or pinch with latch to break latch and re-latch infant at the breast. LC reviewed hand expression and mom will offer infant 3 mls of colostrum she hand expressed to infant in foley cup when she finish breastfeeding infant. Mom knows to breastfeed infant according to feeding cues, 8 to 12+ or more times within 24 hours, skin to skin. Mom knows to call RN/LC if she has any breastfeeding questions, concerns or needs further assistance with latching infant at the breast. Mom has been using her personal DEBP. Mom made aware of O/P services, breastfeeding support groups, community resources, and our phone # for post-discharge questions.   Maternal Data Has patient been taught Hand Expression?: Yes  Feeding Mother's Current Feeding Choice: Breast Milk  LATCH Score Latch: Grasps breast easily, tongue down, lips flanged, rhythmical sucking.  Audible Swallowing: Spontaneous and intermittent  Type of Nipple: Everted at rest and after stimulation  Comfort (Breast/Nipple): Soft / non-tender  Hold (Positioning): Assistance needed to correctly position infant at breast and maintain latch.  LATCH Score:  9   Lactation Tools Discussed/Used    Interventions Interventions: Breast feeding basics reviewed;Assisted with latch;Skin to skin;Breast massage;Hand express;Breast compression;Adjust position;Support pillows;Position options;Expressed milk;Education;LC Services brochure  Discharge    Consult Status Consult Status: Follow-up Date: 10/08/21 Follow-up type: In-patient    Danelle Earthly 10/07/2021, 11:31 PM

## 2021-10-07 NOTE — Addendum Note (Signed)
Addendum  created 10/07/21 1053 by Renford Dills, CRNA   Clinical Note Signed

## 2021-10-07 NOTE — Progress Notes (Signed)
Patient was feeling short of breath with a headache around 0500. Took patients vitals and noted it to be elevated. Patient stated she takes an inhaler three times a day and has not had it the past three days during labor. Called the MD on call and got a order for patient to use home inhaler, sent inhaler to pharmacy to be use while here at the hospital. Inhaler is in patients bin. Retook patients blood pressure 20 minutes later and started to come down, patient also stated her headache was better. Patient took her inhaler and was not short of breath anymore. Blood pressure came back into normal range. Will continue to monitor.

## 2021-10-07 NOTE — Discharge Summary (Signed)
Postpartum Discharge Summary  Date of Service updated11/2/22     Patient Name: Veronica [name] DOB: 1991/03/17 MRN: [id number]  Date of admission: 10/04/2021 Delivery date:10/06/2021  Delivering provider: Aletha Halim  Date of discharge: 10/09/2021  Admitting diagnosis: Encounter for elective induction of labor [Z34.90] Intrauterine pregnancy: [redacted]w[redacted]d    Secondary diagnosis:  Active Problems:   BMI 50.0-59.9, adult (HCC)   GERD (gastroesophageal reflux disease)   GAD (generalized anxiety disorder)   Obesity complicating pregnancy   Supervision of high risk pregnancy, antepartum   Asthma complicating pregnancy, antepartum   Delivered by cesarean delivery following previous cesarean delivery  Additional problems: none    Discharge diagnosis: Term Pregnancy Delivered                                              Post partum procedures: none Augmentation: Pitocin and IP Foley Complications: None  Hospital course: Induction of Labor With Cesarean Section   30y.o. yo G2P2002 at 456w1das admitted to the hospital 10/04/2021 for induction of labor. Patient had a labor course significant for protracted latent phase of labor with arrest of dilation at 5.5cm despite ROM >18 hours and adequate contraction pattern. Transitioned to STAT C/s once already in the OR due to persistent fetal HR in the 90's. The patient went for cesarean section due to Arrest of Dilation and Non-Reassuring FHR. Delivery details are as follows: Membrane Rupture Time/Date: 5:15 AM ,10/06/2021   Delivery Method:C-Section, Low Transverse  Details of operation can be found in separate operative Note.  Patient had an uncomplicated postpartum course. She is ambulating, tolerating a regular diet, passing flatus, and urinating well.  Patient is discharged home in stable condition on 10/09/21.      Newborn Data: Birth date:10/06/2021  Birth time:11:18 PM  Gender:Female  Living status:Living  Apgars:5 ,9   Weight:3365 g                                Magnesium Sulfate received: No BMZ received: No Rhophylac:No MMR:No T-DaP:Given prenatally Flu: Yes Transfusion:No  Physical exam  Vitals:   10/08/21 1234 10/08/21 1820 10/08/21 2027 10/09/21 0519  BP: 128/85 131/87 124/79 128/78  Pulse: 92 96 88 77  Resp: 18 18 18 19   Temp: 98.6 F (37 C) 98.8 F (37.1 C) 98.9 F (37.2 C) 98.7 F (37.1 C)  TempSrc: Oral Oral Oral Oral  SpO2: 100% 99%    Weight:      Height:       General: alert, cooperative, and no distress Lochia: appropriate Uterine Fundus: firm Incision: Healing well with no significant drainage, No significant erythema, Dressing is clean, dry, and intact, Prevena wound vac in place DVT Evaluation: No evidence of DVT seen on physical exam. Negative Homan's sign. No cords or calf tenderness. 1+ BLE edema Labs: Lab Results  Component Value Date   WBC 16.9 (H) 10/07/2021   HGB 10.6 (L) 10/07/2021   HCT 31.7 (L) 10/07/2021   MCV 80.7 10/07/2021   PLT 248 10/07/2021   CMP Latest Ref Rng & Units 03/24/2021  Glucose 70 - 99 mg/dL 73  BUN 6 - 20 mg/dL 12  Creatinine 0.44 - 1.00 mg/dL 0.84  Sodium 135 - 145 mmol/L 133(L)  Potassium 3.5 - 5.1 mmol/L 4.0  Chloride  98 - 111 mmol/L 101  CO2 22 - 32 mmol/L 24  Calcium 8.9 - 10.3 mg/dL 9.1  Total Protein 6.5 - 8.1 g/dL 6.8  Total Bilirubin 0.3 - 1.2 mg/dL 0.2(L)  Alkaline Phos 38 - 126 U/L 57  AST 15 - 41 U/L 11(L)  ALT 0 - 44 U/L 19   Edinburgh Score: Edinburgh Postnatal Depression Scale Screening Tool 10/08/2021  I have been able to laugh and see the funny side of things. 0  I have looked forward with enjoyment to things. 0  I have blamed myself unnecessarily when things went wrong. 1  I have been anxious or worried for no good reason. 2  I have felt scared or panicky for no good reason. 2  Things have been getting on top of me. 1  I have been so unhappy that I have had difficulty sleeping. 1  I have felt sad or  miserable. 0  I have been so unhappy that I have been crying. 0  The thought of harming myself has occurred to me. 0  Edinburgh Postnatal Depression Scale Total 7     After visit meds:  Allergies as of 10/09/2021       Reactions   Morphine And Related Other (See Comments)   Causes "chest to be heavy"   Motrin [ibuprofen]    Was told to not take this because it would flare stomach issues        Medication List     STOP taking these medications    Blood Pressure Kit Devi       TAKE these medications    acetaminophen 500 MG tablet Commonly known as: TYLENOL Take 2 tablets (1,000 mg total) by mouth every 6 (six) hours as needed for moderate pain.   albuterol 108 (90 Base) MCG/ACT inhaler Commonly known as: VENTOLIN HFA INHALE 2 PUFFS BY MOUTH EVERY 4 HOURS AS NEEDED FOR WHEEZING OR SHORTNESS OF BREATH (COUGH).   cetirizine 5 MG tablet Commonly known as: ZYRTEC Take 5 mg by mouth daily.   cyclobenzaprine 10 MG tablet Commonly known as: FLEXERIL Take 1 tablet (10 mg total) by mouth every 8 (eight) hours as needed for muscle spasms.   docusate sodium 100 MG capsule Commonly known as: COLACE Take 1 capsule (100 mg total) by mouth 2 (two) times daily.   ferrous sulfate 325 (65 FE) MG tablet Take 1 tablet (325 mg total) by mouth every other day.   fluticasone 44 MCG/ACT inhaler Commonly known as: Flovent HFA Inhale 2 puffs into the lungs in the morning and at bedtime.   gabapentin 100 MG capsule Commonly known as: NEURONTIN Take 1 capsule (100 mg total) by mouth at bedtime.   hydrOXYzine 10 MG tablet Commonly known as: ATARAX/VISTARIL Take 1 tablet (10 mg total) by mouth 3 (three) times daily as needed for anxiety.   omeprazole 20 MG capsule Commonly known as: PRILOSEC Take 1 capsule (20 mg total) by mouth in the morning and at bedtime.   polyethylene glycol 17 g packet Commonly known as: MIRALAX / GLYCOLAX Take 17 g by mouth daily.   Prenatal Complete  14-0.4 MG Tabs Take 1 tablet by mouth daily.   sertraline 50 MG tablet Commonly known as: ZOLOFT Take 1 tablet (50 mg total) by mouth daily.         Discharge home in stable condition Infant Feeding: Breast Infant Disposition:home with mother Discharge instruction: per After Visit Summary and Postpartum booklet. Activity: Advance as tolerated. Pelvic rest  for 6 weeks.  Diet: routine diet Future Appointments: Future Appointments  Date Time Provider [place]  10/14/2021 10:20 AM [facility] NURSE [facility]  10/18/2021 10:45 AM [facility]  11/04/2021 10:55 AM Hillard Danker, Myles Rosenthal, [name] [facility]   Follow up Visit:  [facility]. Schedule an appointment as soon as possible for a visit.   Specialty: Obstetrics and Gynecology Why: as scheduled Contact information: [place] [id number] [phone]                Message sent by Dr Higinio Plan to [facility] on 10/31:   Please schedule this patient for a In person postpartum visit in 4 weeks with the following provider: Any provider. Additional Postpartum F/U:Incision check 1 week and postpartum anxiety check up   High risk pregnancy complicated by:  History of C/S, BMI 50, anxiety  Delivery mode:  C-Section, Low Transverse  Anticipated Birth Control:  Nexplanon   10/09/2021 Roma Schanz, CNM

## 2021-10-07 NOTE — Progress Notes (Addendum)
CSW met with MOB at [facility] bedside in room 503.  When CSW arrived, MOB was bonding with infant as evidence by engaging in skin to skin and breastfeeding. FOB was also present and everyone appeared happy and comfortable. CSW explained CSW's role and with MOB's permission, CSW asked FOB to leave in order to assess MOB in private; FOB left without incident.  MOB was polite, easy to engage, and was receptive to speaking with CSW.    CSW asked about MOB's MH hx and MOB openly shared she was dx with anx/dep in 2020.  Per MOB, she is currently taking Vistaril and Zoloft to manage her symptoms.  MOB also shared that she engages in natural interventions (daily walks, daily praying, and listening to gospel music) to assist with decreasing symptoms.  MOB also shared that she meets with "Roselyn Reef" at the [facility], and speaking with her routinely has been helpful.    CSW provided education regarding the baby blues period vs. perinatal mood disorders, discussed treatment and gave resources for mental health follow up if concerns arise.  CSW recommends self-evaluation during the postpartum time period using the New Mom Checklist from Postpartum Progress and encouraged MOB to contact a medical professional if symptoms are noted at any time.  MOB presented with insight and awareness and did not demonstrate any acute MH symptoms. MOB reports having a good support team (FOB, MOB's sisters, MOB's church family, and Best Friend) that she feels comfortable calling if needed.   CSW assessed for safety and MOB denied SI, HI, and DV.     CSW identifies no further need for intervention and no barriers to discharge at this time.   Laurey Arrow, MSW, LCSW Clinical Social Work [phone]

## 2021-10-08 NOTE — Lactation Note (Signed)
This note was copied from a baby's chart. Lactation Consultation Note  Patient Name: Boy Nabilah Ndiaye Today's Date: 10/08/2021 Reason for consult: Follow-up assessment;Term;Infant weight loss (-4% weight loss, mom's current feeding plan is breast and formula feeding.) Age:29 hours P2, term female infant -4% weight loss was circumcised earlier today, per mom, she has only latched infant 3 times today and he breastfeed for 10 to 15 minutes in length. Mom has her Personal DEBP from home but has not used it today, she has mostly been formula feeding infant.  Mom denies any issues with latching infant to breast. LC unable to assist with latch at this time due to infant receiving 40 mls of formula at 2000 pm. LC reinforced to pace feed infant to  avoid over feeding.  On 2nd [place] of life infant will cluster feed and this is normal infant behavior. Mom's concern is she doesn't have enough milk ,LC reinforced again ,colostrum is thick in small amount  but enough for infant, LC assisted with hand expression and mom sees colostrum is present both breast. Mom knows to breastfeed infant according to feeding cues, 8 to 12+ times within 24 hours, skin to skin. Mom's feeding plan: 1- Mom said she really wants to breastfeed, due to mom having short shafted nipples, LC suggested she pre-pump breast with hand pump prior to latching infant . 2- Mom will call RN/ LC if she needs latch assistance. 3- Going forward mom will latch infant on both breast during a feeding, afterwards mom will  offer infant any EBM that is pumped first , before supplementing infant with formula. 4- Mom will use her own  Personal DEBP every 3 hours for 15 minutes on initial setting and give infant back any EBM.   Maternal Data    Feeding Mother's Current Feeding Choice: Breast Milk and Formula Nipple Type: Nfant Standard Flow (white)  LATCH Score                    Lactation Tools Discussed/Used Tools: Pump Breast pump  type: Manual Pump Education: Setup, frequency, and cleaning;Milk Storage Reason for Pumping: Pre-pump breast prior to latching infant due to having short shafted nipples. Pumping frequency: Pre-pump prior to latching infant at the breast, mom will use her Personal DEBP every 3 hours for 15 minutes on inital setting. Pumped volume: 7 mL  Interventions    Discharge    Consult Status Consult Status: Follow-up Date: 10/09/21 Follow-up type: In-patient    Danelle Earthly 10/08/2021, 9:10 PM

## 2021-10-08 NOTE — Progress Notes (Signed)
Subjective: Postpartum Day 1: Cesarean Delivery Veronica [name]  is a 30 y.o. [id number] s/p repeat C/S after d/t arrest of dilation at [redacted]w[redacted]d.  She reports she is doing well. No acute events overnight. She denies any problems with ambulating, voiding or po intake. Denies nausea or vomiting.  Pain is well controlled on ibuprofen.  Lochia is moderate and improving.    Objective: Vital signs in last 24 hours: Temp:  [98 F (36.7 C)-98.8 F (37.1 C)] 98.4 F (36.9 C) (11/01 0546) Pulse Rate:  [63-73] 63 (11/01 0546) Resp:  [16-17] 17 (11/01 0546) BP: (120-127)/(63-85) 120/79 (11/01 0546) SpO2:  [99 %-100 %] 100 % (11/01 0546)  Physical Exam:  General: cooperative, fatigued, and morbidly obese Lochia: appropriate Uterine Fundus: firm Incision: Prevena in place and functioning well DVT Evaluation: No evidence of DVT seen on physical exam. Negative Homan's sign. No cords or calf tenderness. No significant calf/ankle edema.  Recent Labs    10/07/21 0507  HGB 10.6*  HCT 31.7*    Assessment/Plan: Status post Cesarean section. Doing well postoperatively.  Continue current care.  Raelyn Mora, CNM 10/08/2021, 7:43 AM

## 2021-10-09 MED ORDER — ACETAMINOPHEN 500 MG PO TABS: 1000.0000 mg | ORAL_TABLET | Freq: Four times a day (QID) | ORAL | 0 refills | Status: DC | PRN

## 2021-10-09 MED ORDER — OXYCODONE HCL 5 MG PO TABS: 5.0000 mg | ORAL_TABLET | ORAL | 0 refills | Status: DC | PRN

## 2021-10-09 MED ORDER — GABAPENTIN 100 MG PO CAPS: 100.0000 mg | ORAL_CAPSULE | Freq: Every day | ORAL | 0 refills | Status: DC

## 2021-10-09 NOTE — Lactation Note (Signed)
This note was copied from a baby's chart. Lactation Consultation Note  Patient Name: Veronica [name] Today's Date: 10/09/2021 Reason for consult: Follow-up assessment;Term Age:29 hours  LC in to visit with P2 Mom of term baby.  Baby at 2% weight loss.  Baby is primarily bottle feeding formula with volumes of 30-50 ml.   Mom states baby latches well to the breast, but she doesn't have any milk.  Reviewed breast massage and hand expression.  Colostrum easily expressed.   Encouraged Mom to offer breast BEFORE supplementing.  Basic education shared about importance of frequent breastfeeding to support a full milk supply.    Mom using hand pump and has a personal Hand's Free double pump.  Mom comparing this baby with when she left hospital on day 3 with her first (11 yrs ago) she remembers her breasts were "pouring with milk".  Reassured her that her breasts would start filling.  If baby is being fed formula, she is at risk for engorgement.  Engorgement prevention and treatment reviewed.  Recommended an OP Lactation appt.  Offered to sent a message, but Mom declined saying she would call if she felt she needed to.   Mom aware of OP lactation support available, and encouraged to call prn.  Lactation Tools Discussed/Used Tools: Pump;Bottle Breast pump type: Manual Pump Education: Setup, frequency, and cleaning;Milk Storage Reason for Pumping: Pre-pump before latch and stimulate milk supply  Interventions Interventions: Breast feeding basics reviewed;Skin to skin;Breast massage;Hand express;Pre-pump if needed;Hand pump  Discharge Discharge Education: Engorgement and breast care;Outpatient recommendation Pump: Personal;Manual (Mom has a hand's free pump for home use)  Consult Status Consult Status: Complete (mother declined follow up) Date: 10/09/21 Follow-up type: Call as needed    Judee Clara 10/09/2021, 9:28 AM

## 2021-10-09 NOTE — Discharge Instructions (Signed)
NO SEX UNTIL AFTER YOU GET YOUR BIRTH CONTROL  

## 2021-10-14 ENCOUNTER — Ambulatory Visit (INDEPENDENT_AMBULATORY_CARE_PROVIDER_SITE_OTHER): Payer: Medicaid Other | Admitting: *Deleted

## 2021-10-14 ENCOUNTER — Other Ambulatory Visit: Payer: Self-pay

## 2021-10-14 ENCOUNTER — Ambulatory Visit: Payer: Self-pay

## 2021-10-14 VITALS — BP 120/78 | HR 82 | Ht 62.0 in | Wt 258.1 lb

## 2021-10-14 DIAGNOSIS — Z5189 Encounter for other specified aftercare: Secondary | ICD-10-CM

## 2021-10-14 NOTE — Progress Notes (Signed)
Here for incision check today. Had c/s 10/07/21. BP today 135/80. Repeat. 120/78. She reports she  does c/o occasional headaches sometimes relieved by tylenol. She reports she is ot getting rest because baby eats every 2 hours. She reports some edema . Mild edema noted. BP repeat 120/78. Reviewed S&S preeclampsia reviewed with patient. Patient states wondering if anxiety meds will be increased. I explained that a provider would have to do that and I asked if she is having issues and needs it increased. She reports she is taking Zoloft and Hydroxyzine about once a day.She reports she is doing ok for now. I advised to let us know if issues and to review with provider at her postpartum appointment. I reviewed date with her.  Her wound vac was still intact and on. I turned off vac and removed wound vac with dressing with much difficulty. Very much  adhesive still intact. Wound remains CDI. Reviewed would care instructions with patient and to call us for any signs of infection or dehiscense. She voices understanding.  Cutler Sunday,RN

## 2021-10-14 NOTE — Progress Notes (Signed)
Note reviewed.  Agree with plan of care.

## 2021-10-14 NOTE — Lactation Note (Signed)
This note was copied from a baby's chart. Lactation Consultation Note  Patient Name: Campbell Lerner [id number] Date: 10/14/2021   Age:29 days  Mother not in room.  MD contacted.  Will try to follow up with mother when she returns.  Per MD, outpatient request has been put in.  RN to call Lactation when mother returns.    Hardie Pulley  RN IBCLC 10/14/2021, 9:34 AM

## 2021-10-14 NOTE — Lactation Note (Signed)
This note was copied from a baby's chart. Lactation Consultation Note  Patient Name: Veronica [name] [id number] Date: 10/14/2021 Reason for consult: Initial assessment;Other (Comment);Term;Nipple pain/trauma (readmit to [facility] - 8 day old/ milk in - see LC note.) Age:29 days LC was called due to request by mother due to right bleeding and broken down tissue.  As LC arrived mom was pumping only on the left breast with #24 F and LC noted it to be a good fit and mom comfortable with 40 ML EBM yield .  LC assessed the right nipple and noted it to be broken down tissue , no bleeding and appeared the tip of the nipple had been chewed off due to poor latch.  LC recommended giving that breat a vacation from latching and only pump on the right and breast feed on the left until the nipple was completely cleared up.  LC also recommended F/U with LC O/P and mom receptive. LC placed a request for LC O/P with [facility] Mom aware she will receive a call back.  LC stressed the importance of protecting milk supply by feeding on the left and pumping on the right.    Maternal Data Has patient been taught Hand Expression?: Yes  Feeding Mother's Current Feeding Choice: Breast Milk and Formula Nipple Type: Slow - flow  LATCH Score                    Lactation Tools Discussed/Used Tools: Pump;Flanges;Comfort gels Flange Size: 24;27;Other (comment) (left #24 and Righ #27 ( due to soreness )) Breast pump type: Double-Electric Breast Pump Pump Education: Setup, frequency, and cleaning;Milk Storage Pumping frequency: pe rmom has been pumpiung when  the baby gets a bottle / up to 60 ml for a feeding / and when latches feeds well / Pumped volume: 120 mL  Interventions    Discharge Discharge Education: Engorgement and breast care Pump: Personal;DEBP  Consult Status Consult Status: Complete Date: 10/14/21    Veronica [name] [name] 10/14/2021, 1:40 PM

## 2021-10-18 ENCOUNTER — Other Ambulatory Visit: Payer: Self-pay

## 2021-10-18 ENCOUNTER — Ambulatory Visit (INDEPENDENT_AMBULATORY_CARE_PROVIDER_SITE_OTHER): Payer: Medicaid Other | Admitting: Clinical

## 2021-10-18 DIAGNOSIS — F411 Generalized anxiety disorder: Secondary | ICD-10-CM

## 2021-10-20 ENCOUNTER — Observation Stay (HOSPITAL_COMMUNITY)
Admission: EM | Admit: 2021-10-20 | Discharge: 2021-10-21 | Disposition: A | Discharge status: Home / Self Care | Payer: Medicaid Other | Attending: Family Medicine | Admitting: Family Medicine

## 2021-10-20 ENCOUNTER — Emergency Department (HOSPITAL_COMMUNITY): Payer: Medicaid Other

## 2021-10-20 ENCOUNTER — Other Ambulatory Visit: Payer: Self-pay

## 2021-10-20 ENCOUNTER — Encounter (HOSPITAL_COMMUNITY): Payer: Self-pay | Admitting: Emergency Medicine

## 2021-10-20 DIAGNOSIS — R0602 Shortness of breath: Secondary | ICD-10-CM | POA: Diagnosis not present

## 2021-10-20 DIAGNOSIS — J45909 Unspecified asthma, uncomplicated: Secondary | ICD-10-CM | POA: Diagnosis not present

## 2021-10-20 DIAGNOSIS — E039 Hypothyroidism, unspecified: Secondary | ICD-10-CM | POA: Diagnosis not present

## 2021-10-20 DIAGNOSIS — E038 Other specified hypothyroidism: Secondary | ICD-10-CM | POA: Diagnosis present

## 2021-10-20 DIAGNOSIS — Z20822 Contact with and (suspected) exposure to covid-19: Secondary | ICD-10-CM | POA: Insufficient documentation

## 2021-10-20 DIAGNOSIS — R42 Dizziness and giddiness: Secondary | ICD-10-CM | POA: Diagnosis not present

## 2021-10-20 DIAGNOSIS — O1495 Unspecified pre-eclampsia, complicating the puerperium: Principal | ICD-10-CM | POA: Diagnosis present

## 2021-10-20 DIAGNOSIS — Z79899 Other long term (current) drug therapy: Secondary | ICD-10-CM | POA: Insufficient documentation

## 2021-10-20 DIAGNOSIS — O34219 Maternal care for unspecified type scar from previous cesarean delivery: Secondary | ICD-10-CM | POA: Diagnosis present

## 2021-10-20 LAB — TROPONIN I (HIGH SENSITIVITY)
Troponin I (High Sensitivity): 4 ng/L (ref <18)
Troponin I (High Sensitivity): 3 ng/L (ref <18)

## 2021-10-20 LAB — HEPATIC FUNCTION PANEL
ALT: 31 U/L (ref 0–44)
AST: 32 U/L (ref 15–41)
Albumin: 4.8 g/dL (ref 3.5–5.0)
Alkaline Phosphatase: 62 U/L (ref 38–126)
Bilirubin, Direct: 0.2 mg/dL (ref 0.0–0.2)
Indirect Bilirubin: 0.7 mg/dL (ref 0.3–0.9)
Total Bilirubin: 0.9 mg/dL (ref 0.3–1.2)
Total Protein: 7.6 g/dL (ref 6.5–8.1)

## 2021-10-20 LAB — URINALYSIS, ROUTINE W REFLEX MICROSCOPIC
Bilirubin Urine: NEGATIVE
Glucose, UA: NEGATIVE mg/dL
Hgb urine dipstick: NEGATIVE
Ketones, ur: NEGATIVE mg/dL
Leukocytes,Ua: NEGATIVE
Nitrite: NEGATIVE
Protein, ur: NEGATIVE mg/dL
Specific Gravity, Urine: 1.002 — ABNORMAL LOW (ref 1.005–1.030)
pH: 6 (ref 5.0–8.0)

## 2021-10-20 LAB — CBC
HCT: 34.2 % — ABNORMAL LOW (ref 36.0–46.0)
Hemoglobin: 11.1 g/dL — ABNORMAL LOW (ref 12.0–15.0)
MCH: 26.6 pg (ref 26.0–34.0)
MCHC: 32.5 g/dL (ref 30.0–36.0)
MCV: 82 fL (ref 80.0–100.0)
Platelets: 558 10*3/uL — ABNORMAL HIGH (ref 150–400)
RBC: 4.17 MIL/uL (ref 3.87–5.11)
RDW: 16.6 % — ABNORMAL HIGH (ref 11.5–15.5)
WBC: 7.3 10*3/uL (ref 4.0–10.5)
nRBC: 0 % (ref 0.0–0.2)

## 2021-10-20 LAB — CBG MONITORING, ED: Glucose-Capillary: 94 mg/dL (ref 70–99)

## 2021-10-20 LAB — BASIC METABOLIC PANEL
Anion gap: 7 (ref 5–15)
BUN: 10 mg/dL (ref 6–20)
CO2: 26 mmol/L (ref 22–32)
Calcium: 9.3 mg/dL (ref 8.9–10.3)
Chloride: 104 mmol/L (ref 98–111)
Creatinine, Ser: 0.93 mg/dL (ref 0.44–1.00)
GFR, Estimated: >60 mL/min (ref >60)
Glucose, Bld: 83 mg/dL (ref 70–99)
Potassium: 3.6 mmol/L (ref 3.5–5.1)
Sodium: 137 mmol/L (ref 135–145)

## 2021-10-20 LAB — HCG, QUANTITATIVE, PREGNANCY: hCG, Beta Chain, Quant, S: 4 m[IU]/mL (ref <5)

## 2021-10-20 LAB — MAGNESIUM: Magnesium: 2.4 mg/dL (ref 1.7–2.4)

## 2021-10-20 LAB — LACTATE DEHYDROGENASE: LDH: 224 U/L — ABNORMAL HIGH (ref 98–192)

## 2021-10-20 LAB — RESP PANEL BY RT-PCR (FLU A&B, COVID) ARPGX2
Influenza A by PCR: NEGATIVE
Influenza B by PCR: NEGATIVE
SARS Coronavirus 2 by RT PCR: NEGATIVE

## 2021-10-20 LAB — BRAIN NATRIURETIC PEPTIDE: B Natriuretic Peptide: 11.5 pg/mL (ref 0.0–100.0)

## 2021-10-20 MED ORDER — ENOXAPARIN SODIUM 60 MG/0.6ML IJ SOSY
60.0000 mg | PREFILLED_SYRINGE | INTRAMUSCULAR | Status: DC
  Administered 2021-10-20: 60 mg via SUBCUTANEOUS
  Filled 2021-10-20: qty 0.6

## 2021-10-20 MED ORDER — LABETALOL HCL 5 MG/ML IV SOLN: 20.0000 mg | INTRAVENOUS | Status: DC | PRN

## 2021-10-20 MED ORDER — FERROUS SULFATE 325 (65 FE) MG PO TABS
325.0000 mg | ORAL_TABLET | ORAL | Status: DC
  Administered 2021-10-21: 325 mg via ORAL
  Filled 2021-10-20: qty 1

## 2021-10-20 MED ORDER — PANTOPRAZOLE SODIUM 40 MG PO TBEC
40.0000 mg | DELAYED_RELEASE_TABLET | Freq: Every day | ORAL | Status: DC
  Administered 2021-10-21: 40 mg via ORAL
  Filled 2021-10-20: qty 1

## 2021-10-20 MED ORDER — LACTATED RINGERS IV SOLN: INTRAVENOUS | Status: DC

## 2021-10-20 MED ORDER — GABAPENTIN 100 MG PO CAPS
100.0000 mg | ORAL_CAPSULE | Freq: Every day | ORAL | Status: DC
  Administered 2021-10-20: 100 mg via ORAL
  Filled 2021-10-20: qty 1

## 2021-10-20 MED ORDER — HYDRALAZINE HCL 20 MG/ML IJ SOLN: 10.0000 mg | INTRAMUSCULAR | Status: DC | PRN

## 2021-10-20 MED ORDER — FUROSEMIDE 40 MG PO TABS
20.0000 mg | ORAL_TABLET | Freq: Two times a day (BID) | ORAL | Status: DC
  Administered 2021-10-21 (×2): 20 mg via ORAL
  Filled 2021-10-20 (×2): qty 1

## 2021-10-20 MED ORDER — MAGNESIUM SULFATE 40 GM/1000ML IV SOLN
2.0000 g/h | INTRAVENOUS | Status: AC
  Administered 2021-10-20: 2 g/h via INTRAVENOUS

## 2021-10-20 MED ORDER — HYDROXYZINE HCL 10 MG PO TABS
10.0000 mg | ORAL_TABLET | Freq: Three times a day (TID) | ORAL | Status: DC | PRN
  Administered 2021-10-20: 10 mg via ORAL
  Filled 2021-10-20 (×2): qty 1

## 2021-10-20 MED ORDER — LABETALOL HCL 5 MG/ML IV SOLN
10.0000 mg | Freq: Once | INTRAVENOUS | Status: AC
  Administered 2021-10-20: 10 mg via INTRAVENOUS
  Filled 2021-10-20: qty 4

## 2021-10-20 MED ORDER — LABETALOL HCL 5 MG/ML IV SOLN: 40.0000 mg | INTRAVENOUS | Status: DC | PRN

## 2021-10-20 MED ORDER — ACETAMINOPHEN 500 MG PO TABS
1000.0000 mg | ORAL_TABLET | Freq: Once | ORAL | Status: AC
  Administered 2021-10-20: 1000 mg via ORAL
  Filled 2021-10-20: qty 2

## 2021-10-20 MED ORDER — NIFEDIPINE ER OSMOTIC RELEASE 30 MG PO TB24
30.0000 mg | ORAL_TABLET | Freq: Two times a day (BID) | ORAL | Status: DC
  Administered 2021-10-20 – 2021-10-21 (×3): 30 mg via ORAL
  Filled 2021-10-20 (×3): qty 1

## 2021-10-20 MED ORDER — MAGNESIUM SULFATE 4 GM/100ML IV SOLN
4.0000 g | Freq: Once | INTRAVENOUS | Status: AC
  Administered 2021-10-20: 4 g via INTRAVENOUS
  Filled 2021-10-20: qty 100

## 2021-10-20 MED ORDER — LABETALOL HCL 5 MG/ML IV SOLN: 80.0000 mg | INTRAVENOUS | Status: DC | PRN

## 2021-10-20 MED ORDER — MAGNESIUM SULFATE 40 GM/1000ML IV SOLN
2.0000 g/h | INTRAVENOUS | Status: DC
  Administered 2021-10-20: 2 g/h via INTRAVENOUS
  Filled 2021-10-20: qty 1000

## 2021-10-20 MED ORDER — SERTRALINE HCL 50 MG PO TABS
50.0000 mg | ORAL_TABLET | Freq: Every day | ORAL | Status: DC
  Administered 2021-10-21: 50 mg via ORAL
  Filled 2021-10-20: qty 1

## 2021-10-20 MED ORDER — DOCUSATE SODIUM 100 MG PO CAPS
100.0000 mg | ORAL_CAPSULE | Freq: Two times a day (BID) | ORAL | Status: DC
  Administered 2021-10-20 – 2021-10-21 (×2): 100 mg via ORAL
  Filled 2021-10-20 (×2): qty 1

## 2021-10-20 NOTE — ED Provider Notes (Signed)
Emergency Medicine Provider Triage Evaluation Note  Veronica [name] , a 30 y.o. female  was evaluated in triage.  Pt complains of weakness.  Review of Systems  Positive: Weak, fatigue, headache, dizzy Negative: Fever, runny nose, sneezing, coughing, sob, abd pain, dysuria  Physical Exam  BP (!) 139/91 (BP Location: Left Arm)   Pulse 80   Temp 98.1 F (36.7 C) (Oral)   Resp 16   LMP 12/29/2020 (Exact Date)   SpO2 93%  Gen:   Awake, no distress   Resp:  Normal effort  MSK:   Moves extremities without difficulty  Other:    Medical Decision Making  Medically screening exam initiated at 3:59 PM.  Appropriate orders placed.  Veronica [name] Veronica [name] was informed that the remainder of the evaluation will be completed by another provider, this initial triage assessment does not replace that evaluation, and the importance of remaining in the ED until their evaluation is complete.  Pt is 2 weeks post partum.  Here with gen weakness, headache, dizzy and fatigue.  No cold sxs, no dysuria or sob.  No heavy bleeding.    Veronica Helper, [name] 10/20/21 1600    Tegeler, Veronica Brim, MD 10/20/21 [phone]

## 2021-10-20 NOTE — ED Triage Notes (Signed)
PT 2 weeks post partum c/o dizziness x1 week worsening today.

## 2021-10-20 NOTE — ED Provider Notes (Signed)
[facility]-EMERGENCY DEPT Provider Note   CSN: [id number] Arrival date & time: 10/20/21  1519     History Chief Complaint  Patient presents with   Dizziness    Veronica [name] is a 30 y.o. female.  The history is provided by the patient and medical records. No language interpreter was used.  Dizziness Quality:  Head spinning Severity:  Moderate Onset quality:  Gradual Duration:  3 days Timing:  Constant Progression:  Worsening Chronicity:  New Worsened by:  Nothing Ineffective treatments:  None tried Associated symptoms: headaches, shortness of breath and vision changes   Associated symptoms: no chest pain, no diarrhea, no nausea, no palpitations, no vomiting and no weakness       Past Medical History:  Diagnosis Date   Abdominal pain 01/06/2012   Acute tension headache 07/27/2019   Anemia    Ankle pain    Asthma    Birth control counseling 08/26/2011   BMI 40.0-44.9, adult (HCC) 02/27/2011   GERD (gastroesophageal reflux disease)    Hiatal hernia    Obesity (BMI 30-39.9)    Secondary amenorrhea 03/25/2018    Patient Active Problem List   Diagnosis Date Noted   Delivered by cesarean delivery following previous cesarean delivery 10/07/2021   Asthma complicating pregnancy, antepartum 06/17/2021   Morbid obesity (HCC) 06/17/2021   Syncope 04/12/2021   Supervision of high risk pregnancy, antepartum 03/20/2021   Asthma 08/31/2020   Back pain 02/13/2020   Hiatal hernia 11/02/2019   Epigastric pain 09/13/2019   Obesity complicating pregnancy 08/19/2019   Subclinical hypothyroidism 05/19/2019   GAD (generalized anxiety disorder) 05/09/2019   GERD (gastroesophageal reflux disease) 03/28/2011   BMI 50.0-59.9, adult (HCC) 02/27/2011   Anemia 02/27/2011    Past Surgical History:  Procedure Laterality Date   CESAREAN SECTION  09/02/2010   For macrosomia, but son was 7 lb 12 oz   CESAREAN SECTION N/A 10/06/2021   Procedure: CESAREAN SECTION;   Surgeon: [place] Bing, MD;  Location: [facility] LD ORS;  Service: Obstetrics;  Laterality: N/A;   TONSILLECTOMY AND ADENOIDECTOMY       OB History     Gravida  2   Para  2   Term  2   Preterm      AB      Living  2      SAB      IAB      Ectopic      Multiple  0   Live Births  2           Family History  Problem Relation Age of Onset   Diabetes Mother    Hypertension Mother    Obesity Mother    Heart attack Mother 98       Death   Obesity Sister    Osteochondroma Sister    Diabetes Sister    Hypertension Sister     Social History   Tobacco Use   Smoking status: Never   Smokeless tobacco: Never  Vaping Use   Vaping Use: Never used  Substance Use Topics   Alcohol use: No   Drug use: Not Currently    Comment: last used in teens    Home Medications Prior to Admission medications   Medication Sig Start Date End Date Taking? Authorizing Provider  acetaminophen (TYLENOL) 500 MG tablet Take 2 tablets (1,000 mg total) by mouth every 6 (six) hours as needed for moderate pain. 10/09/21   Cheral Marker, CNM  albuterol (  VENTOLIN HFA) 108 (90 Base) MCG/ACT inhaler INHALE 2 PUFFS BY MOUTH EVERY 4 HOURS AS NEEDED FOR WHEEZING OR SHORTNESS OF BREATH (COUGH). 10/15/20   Allayne Stack, DO  cetirizine (ZYRTEC) 5 MG tablet Take 5 mg by mouth daily.    [provider]  cyclobenzaprine (FLEXERIL) 10 MG tablet Take 1 tablet (10 mg total) by mouth every 8 (eight) hours as needed for muscle spasms. 06/13/21   Federico Flake, MD  docusate sodium (COLACE) 100 MG capsule Take 1 capsule (100 mg total) by mouth 2 (two) times daily. 06/01/21   Gerrit Heck, CNM  ferrous sulfate 325 (65 FE) MG tablet Take 1 tablet (325 mg total) by mouth every other day. 08/13/21   Warner Mccreedy, MD  fluticasone (FLOVENT HFA) 44 MCG/ACT inhaler Inhale 2 puffs into the lungs in the morning and at bedtime. 09/11/21   Warden Fillers, MD  gabapentin (NEURONTIN) 100 MG capsule Take 1  capsule (100 mg total) by mouth at bedtime. 10/09/21   Cheral Marker, CNM  hydrOXYzine (ATARAX/VISTARIL) 10 MG tablet Take 1 tablet (10 mg total) by mouth 3 (three) times daily as needed for anxiety. 08/06/21   Worthy Rancher, MD  omeprazole (PRILOSEC) 20 MG capsule Take 1 capsule (20 mg total) by mouth in the morning and at bedtime. 07/30/21   Bernerd Limbo, CNM  oxyCODONE (OXY IR/ROXICODONE) 5 MG immediate release tablet Take 1 tablet (5 mg total) by mouth every 4 (four) hours as needed for breakthrough pain or severe pain. 10/09/21   Donette Larry, CNM  polyethylene glycol (MIRALAX / GLYCOLAX) 17 g packet Take 17 g by mouth daily. 06/01/21   Gerrit Heck, CNM  Prenatal Vit-Fe Fumarate-FA (PRENATAL COMPLETE) 14-0.4 MG TABS Take 1 tablet by mouth daily. 02/03/21   Linwood Dibbles, MD  sertraline (ZOLOFT) 50 MG tablet Take 1 tablet (50 mg total) by mouth daily. 05/09/21   Venora Maples, MD    Allergies    Morphine and related and Motrin [ibuprofen]  Review of Systems   Review of Systems  Constitutional:  Positive for fatigue. Negative for chills, diaphoresis and fever.  HENT:  Negative for congestion.   Eyes:  Positive for visual disturbance. Negative for photophobia.  Respiratory:  Positive for shortness of breath. Negative for cough, chest tightness and wheezing.   Cardiovascular:  Positive for leg swelling (improved). Negative for chest pain and palpitations.  Gastrointestinal:  Negative for abdominal pain, constipation, diarrhea, nausea and vomiting.  Genitourinary:  Negative for dysuria, flank pain and frequency.  Musculoskeletal:  Negative for back pain, neck pain and neck stiffness.  Skin:  Negative for rash and wound.  Neurological:  Positive for dizziness and headaches. Negative for seizures, syncope, weakness, light-headedness and numbness.  Psychiatric/Behavioral:  Negative for agitation and confusion.   All other systems reviewed and are negative.  Physical  Exam Updated Vital Signs BP (!) 139/91 (BP Location: Left Arm)   Pulse 80   Temp 98.1 F (36.7 C) (Oral)   Resp 16   LMP 12/29/2020 (Exact Date)   SpO2 93%   Physical Exam Vitals and nursing note reviewed.  Constitutional:      General: She is not in acute distress.    Appearance: She is well-developed. She is not ill-appearing, toxic-appearing or diaphoretic.  HENT:     Head: Normocephalic and atraumatic.     Nose: No congestion or rhinorrhea.     Mouth/Throat:     Mouth: Mucous membranes are moist.  Pharynx: No oropharyngeal exudate or posterior oropharyngeal erythema.  Eyes:     Extraocular Movements: Extraocular movements intact.     Conjunctiva/sclera: Conjunctivae normal.     Pupils: Pupils are equal, round, and reactive to light.  Cardiovascular:     Rate and Rhythm: Normal rate and regular rhythm.     Heart sounds: No murmur heard. Pulmonary:     Effort: Pulmonary effort is normal. No respiratory distress.     Breath sounds: Normal breath sounds. No wheezing, rhonchi or rales.  Chest:     Chest wall: No tenderness.  Abdominal:     General: Abdomen is flat.     Palpations: Abdomen is soft.     Tenderness: There is no abdominal tenderness. There is no guarding or rebound.  Musculoskeletal:        General: No tenderness.     Cervical back: Neck supple. No tenderness.     Right lower leg: No edema.     Left lower leg: No edema.  Skin:    General: Skin is warm and dry.     Capillary Refill: Capillary refill takes less than 2 seconds.     Findings: No erythema or rash.  Neurological:     General: No focal deficit present.     Mental Status: She is alert.     Sensory: No sensory deficit.     Motor: No weakness.  Psychiatric:        Mood and Affect: Mood normal.    ED Results / Procedures / Treatments   Labs (all labs ordered are listed, but only abnormal results are displayed) Labs Reviewed  CBC - Abnormal; Notable for the following components:       Result Value   Hemoglobin 11.1 (*)    HCT 34.2 (*)    RDW 16.6 (*)    Platelets 558 (*)    All other components within normal limits  URINE CULTURE  RESP PANEL BY RT-PCR (FLU A&B, COVID) ARPGX2  BASIC METABOLIC PANEL  HCG, QUANTITATIVE, PREGNANCY  URINALYSIS, ROUTINE W REFLEX MICROSCOPIC  HEPATIC FUNCTION PANEL  BRAIN NATRIURETIC PEPTIDE  MAGNESIUM  LACTATE DEHYDROGENASE  CBG MONITORING, ED  TROPONIN I (HIGH SENSITIVITY)    EKG EKG Interpretation  Date/Time:  Sunday October 20 2021 16:23:41 EST Ventricular Rate:  79 PR Interval:  178 QRS Duration: 87 QT Interval:  365 QTC Calculation: 419 R Axis:   19 Text Interpretation: Sinus rhythm Since last tracing of earlier today No significant change was found Confirmed by Mancel Bale [phone]) on 10/20/2021 4:31:56 PM  Radiology No results found.  Procedures Procedures   CRITICAL CARE Performed by: Canary Brim Yazid Pop Total critical care time: 35 minutes Critical care time was exclusive of separately billable procedures and treating other patients. Critical care was necessary to treat or prevent imminent or life-threatening deterioration. Critical care was time spent personally by me on the following activities: development of treatment plan with patient and/or surrogate as well as nursing, discussions with consultants, evaluation of patient's response to treatment, examination of patient, obtaining history from patient or surrogate, ordering and performing treatments and interventions, ordering and review of laboratory studies, ordering and review of radiographic studies, pulse oximetry and re-evaluation of patient's condition.  Medications Ordered in ED Medications  magnesium sulfate IVPB 4 g 100 mL (has no administration in time range)  acetaminophen (TYLENOL) tablet 1,000 mg (1,000 mg Oral Given 10/20/21 1624)  labetalol (NORMODYNE) injection 10 mg (10 mg Intravenous Given 10/20/21 1909)  ED Course  I have  reviewed the triage vital signs and the nursing notes.  Pertinent labs & imaging results that were available during my care of the patient were reviewed by me and considered in my medical decision making (see chart for details).    MDM Rules/Calculators/A&P                           Veronica [name] is a 30 y.o. female who is 2 weeks postpartum presents with 1 week of worsening fatigue, headaches, dizziness, blurry vision, fatigue, exertional shortness of breath.  She reports that it acutely worsened today prompting her to seek evaluation.  On my initial evaluation, blood pressure was 174 systolic and on reassessment was 184/97.  She reports she is having moderate headache with bilateral blurry vision.  She denies any speech difficulties and denies any nausea or vomiting.  She reports feeling very tired and fatigued.  She reports the edema she had in her legs has actually somewhat improved but she is having more exertional shortness of breath than before.  She denies any chest pain or pressure.  She denies any constipation, diarrhea, or urinary changes.  Denies any abdominal pain.  Denies urinary symptoms.  She denies any history of seizures or eclampsia/preeclampsia.  She denies any history of preeclampsia or history of elevated blood pressures.  On my exam, lungs are clear and chest is nontender.  Abdomen is nontender.  No focal neurologic deficits with normal sensation and strength in extremities.  Normal strength and sensation in face and symmetric smile.  Pupils are symmetric and reactive and extraocular movements.  Patient otherwise well-appearing but she is hypertensive.  Reflexes are intact in lower extremities.  Clinically I am concerned for preeclampsia in the postpartum state.  I called OB/GYN who recommended admission to their service.  They agreed with labetalol which I had ordered and also recommended 4 g of magnesium.  They will admit for further management.  We added on other labs  including hepatic function, urinalysis, chest x-ray, and a BNP/troponin given the exertional shortness of breath.  We will get a COVID and flu test as well.  LDH was added for preeclampsia work-up.  Low suspicion for acute postpartum heart failure but the cardiac work-up and x-ray will help rule that out.  They agree that she does not appear to need head CT at this time given lack of focal or unilateral neurologic deficits initially.   Patient will be admitted to the service of Dr. Shawnie Pons with the OB/GYN team at [facility].  Patient agrees with admission and will be admitted.   Final Clinical Impression(s) / ED Diagnoses Final diagnoses:  Preeclampsia in postpartum period      Clinical Impression 1. Preeclampsia in postpartum period     Disposition: Admit  This note was prepared with assistance of Conservation officer, historic buildings. Occasional wrong-word or sound-a-like substitutions may have occurred due to the inherent limitations of voice recognition software.     Tsuruko Murtha, Canary Brim, MD 10/20/21 [phone]

## 2021-10-20 NOTE — ED Notes (Signed)
Report give to speciality care OB. Patient going to room 8

## 2021-10-20 NOTE — H&P (Signed)
Emmanuela Ghazi Benney is an 30 y.o. G81P2002 female.   Chief Complaint: headache and malaise HPI: s/p RLTCS due to fetal indications on 10/31. BP in the 130/80 range. Went to postop check with BP in similar range, though she reports it was up at home. She does note headache for the past week. Initial BP was elevated in the ED 187/97. Given Labetalol.  Past Medical History:  Diagnosis Date   Abdominal pain 01/06/2012   Acute tension headache 07/27/2019   Anemia    Ankle pain    Asthma    Birth control counseling 08/26/2011   BMI 40.0-44.9, adult (HCC) 02/27/2011   GERD (gastroesophageal reflux disease)    Hiatal hernia    Obesity (BMI 30-39.9)    Secondary amenorrhea 03/25/2018    Past Surgical History:  Procedure Laterality Date   CESAREAN SECTION  09/02/2010   For macrosomia, but son was 7 lb 12 oz   CESAREAN SECTION N/A 10/06/2021   Procedure: CESAREAN SECTION;  Surgeon: [place] Bing, MD;  Location: [facility] LD ORS;  Service: Obstetrics;  Laterality: N/A;   TONSILLECTOMY AND ADENOIDECTOMY      Family History  Problem Relation Age of Onset   Diabetes Mother    Hypertension Mother    Obesity Mother    Heart attack Mother 51       Death   Obesity Sister    Osteochondroma Sister    Diabetes Sister    Hypertension Sister    Social History:  reports that she has never smoked. She has never used smokeless tobacco. She reports that she does not currently use drugs. She reports that she does not drink alcohol.  Allergies:  Allergies  Allergen Reactions   Morphine And Related Other (See Comments)    Causes "chest to be heavy"   Motrin [Ibuprofen]     Was told to not take this because it would flare stomach issues    Medications Prior to Admission  Medication Sig Dispense Refill   acetaminophen (TYLENOL) 500 MG tablet Take 2 tablets (1,000 mg total) by mouth every 6 (six) hours as needed for moderate pain. 60 tablet 0   albuterol (VENTOLIN HFA) 108 (90 Base) MCG/ACT inhaler INHALE 2  PUFFS BY MOUTH EVERY 4 HOURS AS NEEDED FOR WHEEZING OR SHORTNESS OF BREATH (COUGH). 18 g 0   cetirizine (ZYRTEC) 5 MG tablet Take 5 mg by mouth daily.     cyclobenzaprine (FLEXERIL) 10 MG tablet Take 1 tablet (10 mg total) by mouth every 8 (eight) hours as needed for muscle spasms. 30 tablet 1   docusate sodium (COLACE) 100 MG capsule Take 1 capsule (100 mg total) by mouth 2 (two) times daily. 60 capsule 1   ferrous sulfate 325 (65 FE) MG tablet Take 1 tablet (325 mg total) by mouth every other day. 30 tablet 1   fluticasone (FLOVENT HFA) 44 MCG/ACT inhaler Inhale 2 puffs into the lungs in the morning and at bedtime. 1 each 12   gabapentin (NEURONTIN) 100 MG capsule Take 1 capsule (100 mg total) by mouth at bedtime. 30 capsule 0   hydrOXYzine (ATARAX/VISTARIL) 10 MG tablet Take 1 tablet (10 mg total) by mouth 3 (three) times daily as needed for anxiety. 30 tablet 2   omeprazole (PRILOSEC) 20 MG capsule Take 1 capsule (20 mg total) by mouth in the morning and at bedtime. 60 capsule 3   oxyCODONE (OXY IR/ROXICODONE) 5 MG immediate release tablet Take 1 tablet (5 mg total) by mouth every 4 (  four) hours as needed for breakthrough pain or severe pain. 20 tablet 0   polyethylene glycol (MIRALAX / GLYCOLAX) 17 g packet Take 17 g by mouth daily. 14 each 1   Prenatal Vit-Fe Fumarate-FA (PRENATAL COMPLETE) 14-0.4 MG TABS Take 1 tablet by mouth daily. 60 tablet 3   sertraline (ZOLOFT) 50 MG tablet Take 1 tablet (50 mg total) by mouth daily. 30 tablet 5    Pertinent items are noted in HPI. And is otherwise negative.  Blood pressure 131/75, pulse 84, temperature 98.1 F (36.7 C), temperature source Oral, resp. rate 18, last menstrual period 12/29/2020, SpO2 99 %, unknown if currently breastfeeding. General appearance: alert, cooperative, and appears stated age Head: Normocephalic, without obvious abnormality, atraumatic Neck: supple, symmetrical, trachea midline Lungs:  normal effort Heart: regular rate  and rhythm Abdomen: soft, non-tender; bowel sounds normal; no masses,  no organomegaly Extremities: extremities normal, atraumatic, no cyanosis or edema Skin: Skin color, texture, turgor normal. No rashes or lesions Lymph nodes: Cervical, supraclavicular, and axillary nodes normal. Neurologic: Grossly normal   Lab Results  Component Value Date   WBC 7.3 10/20/2021   HGB 11.1 (L) 10/20/2021   HCT 34.2 (L) 10/20/2021   MCV 82.0 10/20/2021   PLT 558 (H) 10/20/2021   Lab Results  Component Value Date   PREGTESTUR POSITIVE (A) 02/03/2021   HCG <5.0 05/27/2019   CMP     Component Value Date/Time   NA 137 10/20/2021 1621   NA 137 09/13/2019 1358   K 3.6 10/20/2021 1621   CL 104 10/20/2021 1621   CO2 26 10/20/2021 1621   GLUCOSE 83 10/20/2021 1621   BUN 10 10/20/2021 1621   BUN 6 09/13/2019 1358   CREATININE 0.93 10/20/2021 1621   CREATININE 1.00 11/07/2014 1639   CALCIUM 9.3 10/20/2021 1621   PROT 7.6 10/20/2021 1621   PROT 7.0 09/13/2019 1358   ALBUMIN 4.8 10/20/2021 1621   ALBUMIN 4.0 09/13/2019 1358   AST 32 10/20/2021 1621   ALT 31 10/20/2021 1621   ALKPHOS 62 10/20/2021 1621   BILITOT 0.9 10/20/2021 1621   BILITOT 0.6 09/13/2019 1358   GFRNONAA >60 10/20/2021 1621   GFRAA 101 09/13/2019 1358      Assessment/Plan Principal Problem:   Preeclampsia in postpartum period Active Problems:   Subclinical hypothyroidism   Morbid obesity (HCC)   Delivered by cesarean delivery following previous cesarean delivery  Admit to [facility] - observation unless needs to stay past tomorrow. Magnesium x 24 hours Procardia for BP control Wants to quit breast feeding Lasix x 5 days   Reva Bores 10/20/2021, 10:06 PM

## 2021-10-21 ENCOUNTER — Telehealth (HOSPITAL_COMMUNITY): Payer: Self-pay | Admitting: *Deleted

## 2021-10-21 ENCOUNTER — Other Ambulatory Visit: Payer: Self-pay | Admitting: Medical

## 2021-10-21 DIAGNOSIS — F411 Generalized anxiety disorder: Secondary | ICD-10-CM

## 2021-10-21 DIAGNOSIS — O1495 Unspecified pre-eclampsia, complicating the puerperium: Secondary | ICD-10-CM | POA: Diagnosis not present

## 2021-10-21 LAB — PROTEIN / CREATININE RATIO, URINE
Creatinine, Urine: 15.95 mg/dL
Total Protein, Urine: <6 mg/dL

## 2021-10-21 MED ORDER — SERTRALINE HCL 100 MG PO TABS
100.0000 mg | ORAL_TABLET | Freq: Every day | ORAL | 3 refills | Status: DC
Start: 2021-10-21 — End: 2021-11-20

## 2021-10-21 MED ORDER — PANTOPRAZOLE SODIUM 40 MG PO TBEC
40.0000 mg | DELAYED_RELEASE_TABLET | Freq: Every day | ORAL | 1 refills | Status: DC
Start: 2021-10-22 — End: 2022-03-14

## 2021-10-21 MED ORDER — NIFEDIPINE ER 30 MG PO TB24: 30.0000 mg | ORAL_TABLET | Freq: Every day | ORAL | 0 refills | Status: DC

## 2021-10-21 MED ORDER — FUROSEMIDE 20 MG PO TABS: 20.0000 mg | ORAL_TABLET | Freq: Two times a day (BID) | ORAL | 0 refills | Status: DC

## 2021-10-21 MED ORDER — ACETAMINOPHEN 500 MG PO TABS
1000.0000 mg | ORAL_TABLET | Freq: Four times a day (QID) | ORAL | Status: DC | PRN
  Administered 2021-10-21: 1000 mg via ORAL
  Filled 2021-10-21: qty 2

## 2021-10-21 MED ORDER — CYCLOBENZAPRINE HCL 10 MG PO TABS
10.0000 mg | ORAL_TABLET | Freq: Three times a day (TID) | ORAL | Status: DC | PRN
  Administered 2021-10-21: 10 mg via ORAL
  Filled 2021-10-21: qty 1

## 2021-10-21 NOTE — Progress Notes (Signed)
Received a staff message from [facility], [facility], that patient would like to increase Zoloft to pre-pregnancy dosage as soon as possible. I have sent Rx for 100 mg Zoloft daily to patient's pharmacy and will see her for routine PP visit as scheduled.   Vonzella Nipple, [name] 10/21/2021 9:02 AM

## 2021-10-21 NOTE — Plan of Care (Signed)
  Problem: Health Behavior/Discharge Planning: Goal: Ability to manage health-related needs will improve Outcome: Progressing   Problem: Activity: Goal: Risk for activity intolerance will decrease Outcome: Progressing   Problem: Elimination: Goal: Will not experience complications related to bowel motility Outcome: Progressing Goal: Will not experience complications related to urinary retention Outcome: Progressing   Problem: Safety: Goal: Ability to remain free from injury will improve Outcome: Progressing   Problem: Education: Goal: Knowledge of disease or condition will improve Outcome: Progressing Goal: Knowledge of the prescribed therapeutic regimen will improve Outcome: Progressing   Problem: Fluid Volume: Goal: Peripheral tissue perfusion will improve Outcome: Progressing   Problem: Clinical Measurements: Goal: Complications related to disease process, condition or treatment will be avoided or minimized Outcome: Progressing   Problem: Education: Goal: Knowledge of General Education information will improve Description: Including pain rating scale, medication(s)/side effects and non-pharmacologic comfort measures Outcome: Progressing   Problem: Health Behavior/Discharge Planning: Goal: Ability to manage health-related needs will improve Outcome: Progressing   Problem: Clinical Measurements: Goal: Ability to maintain clinical measurements within normal limits will improve Outcome: Progressing Goal: Will remain free from infection Outcome: Progressing Goal: Diagnostic test results will improve Outcome: Progressing Goal: Respiratory complications will improve Outcome: Progressing Goal: Cardiovascular complication will be avoided Outcome: Progressing   Problem: Activity: Goal: Risk for activity intolerance will decrease Outcome: Progressing   Problem: Nutrition: Goal: Adequate nutrition will be maintained Outcome: Progressing   Problem: Coping: Goal:  Level of anxiety will decrease Outcome: Progressing   Problem: Elimination: Goal: Will not experience complications related to bowel motility Outcome: Progressing Goal: Will not experience complications related to urinary retention Outcome: Progressing   Problem: Pain Managment: Goal: General experience of comfort will improve Outcome: Progressing   Problem: Safety: Goal: Ability to remain free from injury will improve Outcome: Progressing   Problem: Skin Integrity: Goal: Risk for impaired skin integrity will decrease Outcome: Progressing

## 2021-10-21 NOTE — Discharge Summary (Signed)
Physician Discharge Summary  Patient ID: Veronica [name] MRN: [id number] DOB/AGE: July 31, 1991 30 y.o.  Admit date: 10/20/2021 Discharge date: 10/21/2021  Admission Diagnoses:postpartum preeclampsia  Discharge Diagnoses:  Principal Problem:   Preeclampsia in postpartum period Active Problems:   Subclinical hypothyroidism   Morbid obesity [facility])   Delivered by cesarean delivery following previous cesarean delivery   Discharged Condition: good  Hospital Course: s/p RLTCS due to fetal indications on 10/31. BP in the 130/80 range. Went to postop check with BP in similar range, though she reports it was up at home. She does note headache for the past week. Initial BP was elevated in the ED 187/97. Given Labetalol. She presented with headache and elevated BP and was admitted for Preeclampsia in postpartum period.   She received Lasix PO and Procardia and her BP was well controlled. After 24 hr on magnesium sulfate she was discharged   Consults: None  Significant Diagnostic Studies: labs: CMP, Pr/CR CMP Latest Ref Rng & Units 10/20/2021 03/24/2021 02/20/2021  Glucose 70 - 99 mg/dL 83 73 90  BUN 6 - 20 mg/dL 10 12 15   Creatinine 0.44 - 1.00 mg/dL 2.83 1.51  Sodium 135 - 145 mmol/L 137 133(L) 133(L)  Potassium 3.5 - 5.1 mmol/L 3.6 4.0 3.7  Chloride 98 - 111 mmol/L 104 101 101  CO2 22 - 32 mmol/L 26 24 26   Calcium 8.9 - 10.3 mg/dL 9.3 9.1 9.1  Total Protein 6.5 - 8.1 g/dL 7.6 6.8 7.0  Total Bilirubin 0.3 - 1.2 mg/dL 0.9 7.61) 0.5  Alkaline Phos 38 - 126 U/L 62 57 48  AST 15 - 41 U/L 32 11(L) 9(L)  ALT 0 - 44 U/L 31 19 12    CBC    Component Value Date/Time   WBC 7.3 10/20/2021 1621   RBC 4.17 10/20/2021 1621   HGB 11.1 (L) 10/20/2021 1621   HGB 11.2 09/11/2021 1550   HGB 10.7 (L) 03/16/2013 1413   HCT 34.2 (L) 10/20/2021 1621   HCT 33.4 (L) 09/11/2021 1550   HCT 32.4 (L) 03/16/2013 1413   PLT 558 (H) 10/20/2021 1621   PLT 350 09/11/2021 1550   MCV 82.0 10/20/2021 1621    MCV 78 (L) 09/11/2021 1550   MCV 75.3 (L) 03/16/2013 1413   MCH 26.6 10/20/2021 1621   MCHC 32.5 10/20/2021 1621   RDW 16.6 (H) 10/20/2021 1621   RDW 14.5 09/11/2021 1550   RDW 16.0 (H) 03/16/2013 1413   LYMPHSABS 2.6 02/05/2021 1159   LYMPHSABS 3.2 03/16/2013 1413   MONOABS 0.6 05/27/2019 1926   MONOABS 0.3 03/16/2013 1413   EOSABS 0.1 02/05/2021 1159   BASOSABS 0.0 02/05/2021 1159   BASOSABS 0.0 03/16/2013 1413     Treatments: IV hydration and magnesium sulfate IV, Nifedipine, Lasix  Discharge Exam: Blood pressure 113/65, pulse 83, temperature 98.4 F (36.9 C), temperature source Oral, resp. rate 17, height 5\' 2"  (1.575 m), weight 111.6 kg, last menstrual period 12/29/2020, SpO2 100 %, unknown if currently breastfeeding. General appearance: alert, cooperative, and no distress Resp: clear to auscultation bilaterally Extremities: edema 1+ Skin: Skin color, texture, turgor normal. No rashes or lesions  Disposition:  Discharge disposition: 01-Home or Self Care      Discharge Instructions     Discharge patient   Complete by: As directed    Discharge disposition: 01-Home or Self Care   Discharge patient date: 10/21/2021      Allergies as of 10/21/2021       Reactions  Morphine And Related Other (See Comments)   Causes "chest to be heavy"   Motrin [ibuprofen]    Was told to not take this because it would flare stomach issues        Medication List     TAKE these medications    acetaminophen 500 MG tablet Commonly known as: TYLENOL Take 2 tablets (1,000 mg total) by mouth every 6 (six) hours as needed for moderate pain.   albuterol 108 (90 Base) MCG/ACT inhaler Commonly known as: VENTOLIN HFA INHALE 2 PUFFS BY MOUTH EVERY 4 HOURS AS NEEDED FOR WHEEZING OR SHORTNESS OF BREATH (COUGH).   cetirizine 5 MG tablet Commonly known as: ZYRTEC Take 5 mg by mouth daily.   cyclobenzaprine 10 MG tablet Commonly known as: FLEXERIL Take 1 tablet (10 mg total) by  mouth every 8 (eight) hours as needed for muscle spasms.   docusate sodium 100 MG capsule Commonly known as: COLACE Take 1 capsule (100 mg total) by mouth 2 (two) times daily.   ferrous sulfate 325 (65 FE) MG tablet Take 1 tablet (325 mg total) by mouth every other day.   fluticasone 44 MCG/ACT inhaler Commonly known as: Flovent HFA Inhale 2 puffs into the lungs in the morning and at bedtime.   furosemide 20 MG tablet Commonly known as: LASIX Take 1 tablet (20 mg total) by mouth 2 (two) times daily.   gabapentin 100 MG capsule Commonly known as: NEURONTIN Take 1 capsule (100 mg total) by mouth at bedtime.   hydrOXYzine 10 MG tablet Commonly known as: ATARAX/VISTARIL Take 1 tablet (10 mg total) by mouth 3 (three) times daily as needed for anxiety.   NIFEdipine 30 MG 24 hr tablet Commonly known as: ADALAT CC Take 1 tablet (30 mg total) by mouth daily.   omeprazole 20 MG capsule Commonly known as: PRILOSEC Take 1 capsule (20 mg total) by mouth in the morning and at bedtime.   oxyCODONE 5 MG immediate release tablet Commonly known as: Oxy IR/ROXICODONE Take 1 tablet (5 mg total) by mouth every 4 (four) hours as needed for breakthrough pain or severe pain.   pantoprazole 40 MG tablet Commonly known as: PROTONIX Take 1 tablet (40 mg total) by mouth daily. Start taking on: October 22, 2021   polyethylene glycol 17 g packet Commonly known as: MIRALAX / GLYCOLAX Take 17 g by mouth daily.   Prenatal Complete 14-0.4 MG Tabs Take 1 tablet by mouth daily.   sertraline 100 MG tablet Commonly known as: ZOLOFT Take 1 tablet (100 mg total) by mouth daily. What changed:  medication strength how much to take        Follow-up Information     [facility] Follow up in 2 week(s).   Specialty: Obstetrics and Gynecology Contact information: [address] [place] [address] [phone]                 Signed: Scheryl Darter 10/21/2021, 3:20 PM

## 2021-10-21 NOTE — Telephone Encounter (Signed)
Call not completed due to patient's current inpatient status.  Duffy Rhody, RN 10-21-2021 at 2:19pm

## 2021-10-21 NOTE — Progress Notes (Signed)
Discharge instructions given to patient and RN reviewed follow-up appointments, medication instructions, and signs/symptoms to report to doctor including headache, vision changes, and epigastric pain.   All content was reviewed and questions were answered. Pt discharged home with boyfriend.   Quincy Simmonds, RN

## 2021-10-21 NOTE — Plan of Care (Signed)
EKG cardiac monitoring reviewed. Agree with NSR from tele.

## 2021-10-22 ENCOUNTER — Telehealth: Payer: Self-pay

## 2021-10-22 ENCOUNTER — Other Ambulatory Visit: Payer: Self-pay

## 2021-10-22 ENCOUNTER — Ambulatory Visit (INDEPENDENT_AMBULATORY_CARE_PROVIDER_SITE_OTHER): Payer: Medicaid Other

## 2021-10-22 VITALS — BP 119/79 | HR 93 | Wt 245.6 lb

## 2021-10-22 DIAGNOSIS — Z719 Counseling, unspecified: Secondary | ICD-10-CM | POA: Diagnosis not present

## 2021-10-22 DIAGNOSIS — Z0130 Encounter for examination of blood pressure without abnormal findings: Secondary | ICD-10-CM

## 2021-10-22 LAB — URINE CULTURE: Culture: <10000 — AB

## 2021-10-22 MED ORDER — NIFEDIPINE ER OSMOTIC RELEASE 30 MG PO TB24: 30.0000 mg | ORAL_TABLET | Freq: Two times a day (BID) | ORAL | 0 refills | Status: DC

## 2021-10-22 NOTE — Telephone Encounter (Signed)
Call placed to pharmacy. States pt has already picked up Rx of #30, 0 RF.  Call placed to pt. Pt informed to start taking 30mg  BID of BP Rx. Pt agreeable to plan of care. New Rx with BID # 60, 0RF sent to pharmacy in case pt runs out before PP visit on 11/28.  12/28

## 2021-10-22 NOTE — Telephone Encounter (Signed)
-----   Message from Milas Hock, MD sent at 10/21/2021  7:56 PM EST ----- Regarding: Procardia 30 xl rx Hey - she was sent home on daily procardia but was actually getting BID. Please call her pharmacy and adjust rx in the computer pleasE?  Thanks! pad

## 2021-10-22 NOTE — Patient Instructions (Addendum)
Check blood pressure with any symptoms:  Headache Blurry vision Dizziness  Elevated BP: 140/90 (either the top or bottom number or both)  Call the office if elevated. Go to Maternity Assessment Unit if office is closed and blood pressure is 150/100 or higher.

## 2021-10-22 NOTE — Progress Notes (Signed)
Blood Pressure Teaching Visit  Veronica [name] came into front office and requested that BP cuff be checked for accuracy. Here following c-section on 10/06/21 with readmission for preeclampsia in PP period from 10/20/21-10/21/21.  Reports taking Nifedipine 30 mg this AM at 0700. Currently taking BID as prescribed. States headache today began at 7:30-8 AM and has persisted until now. Describes as pressure like pain; rates 5/10 on pain scale. Has not taken any medication to help with headache. Encouraged pt to take 2 extra strength Tylenol to see if this improves headache. Pt to follow up with office if headache persists or worsens. Reports intermittent blurry vision of letters far away since delivery. BP today is 119/70, HR 93. Home BP cuff reads 119/79, HR 87. Reviewed how to check BP with patient. Patient able to demonstrate back to RN and successfully take accurate BP.   Pt reports continued anxiety, which has worsened following pre-e diagnosis. Reviewed with [facility] who states she will call patient for check in tomorrow at 10 AM. Pt will follow up at [facility] visit on 11/04/21 with provider.  Chart routed to [facility], DO for review and any additional recommendations.  Marjo Bicker, RN 10/22/2021  2:23 PM

## 2021-10-23 ENCOUNTER — Telehealth: Payer: Self-pay | Admitting: Clinical

## 2021-10-23 NOTE — Progress Notes (Signed)
Chart reviewed - agree with CMA/RN documentation.  ° °

## 2021-10-23 NOTE — Telephone Encounter (Signed)
Follow up call, as requested by pt; pt is worried about safety of taking BP medication, and losing weight postpartum (297lbs to 245lbs); pt is taking "water pills" and BP medicine as prescribed. Pt agrees to continue taking, along with Zoloft, and will discuss with medical provider on 11/04/21 any concerns she has regarding length of time she will need to continue taking BP medication.

## 2021-10-23 NOTE — BH Specialist Note (Signed)
Integrated Behavioral Health via Telemedicine Visit  10/23/2021 [name] [id number]  Number of Integrated Behavioral Health visits: 5 Session Start time: 10:48 Session End time: 11:24 Total time:  63  Referring Provider: Merian Capron, MD Patient/Family location: Home [facility] Provider location: [facility]  All persons participating in visit: Patient Veronica [name] and [facility] [place]   Types of Service: Individual psychotherapy and Video visit  I connected with Merlene Laughter Frazer and/or Merlene Laughter Melrose's  n/a  via  Telephone or Video Enabled Telemedicine Application  (Video is Caregility application) and verified that I am speaking with the correct person using two identifiers. Discussed confidentiality: Yes   I discussed the limitations of telemedicine and the availability of in person appointments.  Discussed there is a possibility of technology failure and discussed alternative modes of communication if that failure occurs.  I discussed that engaging in this telemedicine visit, they consent to the provision of behavioral healthcare and the services will be billed under their insurance.  Patient and/or legal guardian expressed understanding and consented to Telemedicine visit: Yes   Presenting Concerns: Patient and/or family reports the following symptoms/concerns: worry about BP going too low both taking BP meds and walking; worry about upper back hurting and whether or not she is consuming appropriate amounts of salt on very low-salt diet. Pt is taking Wellbutrin daily with no negative side effects; has not established with new PCP yet. First weight goal is to get down to 200 lbs.  Duration of problem: Postpartum; Severity of problem: moderate  Patient and/or Family's Strengths/Protective Factors: Social connections, Concrete supports in place (healthy food, safe environments, etc.), Sense of purpose, and Physical  Health (exercise, healthy diet, medication compliance, etc.)  Goals Addressed: Patient will:  Reduce symptoms of: anxiety   Demonstrate ability to: Increase healthy adjustment to current life circumstances  Progress towards Goals: Ongoing  Interventions: Interventions utilized:  Solution-Focused Strategies Standardized Assessments completed:  PHQ9/GAD7 given in past two weeks  Patient and/or Family Response: Pt agrees with treatment plan  Assessment: Patient currently experiencing Generalized anxiety disorder.   Patient may benefit from continued psychoeducation and brief therapeutic interventions regarding coping with symptoms of anxiety .  Plan: Follow up with behavioral health clinician on : Two weeks Behavioral recommendations:  -Continue taking Wellbutrin as prescribed -Call new PCP to set up initial appointment  -Accept referral to [facility] for outpatient therapy -Discuss with medical provider concerns about BP, salt intake, upper back pain, and medical  -Consider new mom support group at [email or web address] as needed Referral(s): Integrated [employer] (In Clinic)  I discussed the assessment and treatment plan with the patient and/or parent/guardian. They were provided an opportunity to ask questions and all were answered. They agreed with the plan and demonstrated an understanding of the instructions.   They were advised to call back or seek an in-person evaluation if the symptoms worsen or if the condition fails to improve as anticipated.  [name], LCSW  Depression screen [facility] 2/9 10/22/2021 10/02/2021 09/23/2021 08/28/2021 08/13/2021  Decreased Interest 1 1 1 1 1   Down, Depressed, Hopeless 1 1 1 2 1   PHQ - 2 Score 2 2 2 3 2   Altered sleeping 0 1 1 2 1   Tired, decreased energy 1 1 2 1 1   Change in appetite 2 1 0 0 1  Feeling bad or failure about yourself  0 0 0 0 0  Trouble concentrating 0 0 0  0 0  Moving slowly or fidgety/restless 0 0 0 0 0   Suicidal thoughts 0 0 0 0 0  PHQ-9 Score 5 5 5 6 5   Difficult doing work/chores - - - - -  Some recent data might be hidden   GAD 7 : Generalized Anxiety Score 10/22/2021 09/23/2021 08/28/2021 08/13/2021  Nervous, Anxious, on Edge 1 1 3 2   Control/stop worrying 1 1 3 3   Worry too much - different things 1 1 3 3   Trouble relaxing 0 1 1 1   Restless 0 1 1 1   Easily annoyed or irritable 0 2 1 1   Afraid - awful might happen 1 1 2 2   Total GAD 7 Score 4 8 14 13   Anxiety Difficulty - - - -

## 2021-10-25 ENCOUNTER — Telehealth: Payer: Self-pay

## 2021-10-25 NOTE — Telephone Encounter (Signed)
Error

## 2021-10-26 ENCOUNTER — Encounter (HOSPITAL_COMMUNITY): Payer: Self-pay | Admitting: Obstetrics and Gynecology

## 2021-10-26 ENCOUNTER — Inpatient Hospital Stay (HOSPITAL_COMMUNITY)
Admission: AD | Admit: 2021-10-26 | Discharge: 2021-10-26 | Disposition: A | Discharge status: Home / Self Care | Payer: Medicaid Other | Attending: Obstetrics and Gynecology | Admitting: Obstetrics and Gynecology

## 2021-10-26 ENCOUNTER — Other Ambulatory Visit: Payer: Self-pay

## 2021-10-26 DIAGNOSIS — O9943 Diseases of the circulatory system complicating the puerperium: Secondary | ICD-10-CM | POA: Diagnosis not present

## 2021-10-26 DIAGNOSIS — Z79899 Other long term (current) drug therapy: Secondary | ICD-10-CM | POA: Insufficient documentation

## 2021-10-26 DIAGNOSIS — H538 Other visual disturbances: Secondary | ICD-10-CM | POA: Insufficient documentation

## 2021-10-26 DIAGNOSIS — R000 Tachycardia, unspecified: Secondary | ICD-10-CM | POA: Insufficient documentation

## 2021-10-26 DIAGNOSIS — R002 Palpitations: Secondary | ICD-10-CM | POA: Diagnosis not present

## 2021-10-26 DIAGNOSIS — F418 Other specified anxiety disorders: Secondary | ICD-10-CM | POA: Diagnosis not present

## 2021-10-26 LAB — URINALYSIS, ROUTINE W REFLEX MICROSCOPIC
Bilirubin Urine: NEGATIVE
Glucose, UA: NEGATIVE mg/dL
Hgb urine dipstick: NEGATIVE
Ketones, ur: NEGATIVE mg/dL
Leukocytes,Ua: NEGATIVE
Nitrite: NEGATIVE
Protein, ur: NEGATIVE mg/dL
Specific Gravity, Urine: 1.002 — ABNORMAL LOW (ref 1.005–1.030)
pH: 6 (ref 5.0–8.0)

## 2021-10-26 MED ORDER — NIFEDIPINE ER OSMOTIC RELEASE 30 MG PO TB24
60.0000 mg | ORAL_TABLET | Freq: Once | ORAL | Status: AC
  Administered 2021-10-26: 60 mg via ORAL
  Filled 2021-10-26: qty 2

## 2021-10-26 NOTE — MAU Provider Note (Signed)
History     CSN: [id number]  Arrival date and time: 10/26/21 0049   Event Date/Time   First Provider Initiated Contact with Patient 10/26/21 0139      Chief Complaint  Patient presents with   Blurred Vision   Palpitations   Veronica [name] is a 30 y.o. G2P2002 at 19 Days Postpartum who receives care at [facility].  She presents today for Blurred Vision and Palpitations.  She states the heart palpitations started around "11 something" and woke patient from her sleep.  She states she she has not experienced it since that time.   Patient is taking procardia 30 mg BID and her last dose was at 7 am. She denies HA, but reports some "head pressure" that she states is located in the front of her head.  She denies SOB, chest pain, chest tightness, or leg pain.  Patient does endorse intermittent cough.  She also c/o blurry vision, but confirms that this has been present since before delivery.  She reports she has not been to an optometrist "in a while."  Patient denies pain and reports bleeding is scant; "just some spotting."    OB History     Gravida  2   Para  2   Term  2   Preterm      AB      Living  2      SAB      IAB      Ectopic      Multiple  0   Live Births  2           Past Medical History:  Diagnosis Date   Abdominal pain 01/06/2012   Acute tension headache 07/27/2019   Anemia    Ankle pain    Asthma    Birth control counseling 08/26/2011   BMI 40.0-44.9, adult (HCC) 02/27/2011   GERD (gastroesophageal reflux disease)    Hiatal hernia    Obesity (BMI 30-39.9)    Secondary amenorrhea 03/25/2018    Past Surgical History:  Procedure Laterality Date   CESAREAN SECTION  09/02/2010   For macrosomia, but son was 7 lb 12 oz   CESAREAN SECTION N/A 10/06/2021   Procedure: CESAREAN SECTION;  Surgeon: McHenry Bing, MD;  Location: [facility] LD ORS;  Service: Obstetrics;  Laterality: N/A;   TONSILLECTOMY AND ADENOIDECTOMY      Family History  Problem Relation Age of  Onset   Diabetes Mother    Hypertension Mother    Obesity Mother    Heart attack Mother 76       Death   Obesity Sister    Osteochondroma Sister    Diabetes Sister    Hypertension Sister     Social History   Tobacco Use   Smoking status: Never   Smokeless tobacco: Never  Vaping Use   Vaping Use: Never used  Substance Use Topics   Alcohol use: No   Drug use: Not Currently    Comment: last used in teens    Allergies:  Allergies  Allergen Reactions   Morphine And Related Other (See Comments)    Causes "chest to be heavy"   Motrin [Ibuprofen]     Was told to not take this because it would flare stomach issues    Medications Prior to Admission  Medication Sig Dispense Refill Last Dose   acetaminophen (TYLENOL) 500 MG tablet Take 2 tablets (1,000 mg total) by mouth every 6 (six) hours as needed for moderate pain. 60 tablet  0 10/25/2021   albuterol (VENTOLIN HFA) 108 (90 Base) MCG/ACT inhaler INHALE 2 PUFFS BY MOUTH EVERY 4 HOURS AS NEEDED FOR WHEEZING OR SHORTNESS OF BREATH (COUGH). 18 g 0 Past Week   cetirizine (ZYRTEC) 5 MG tablet Take 5 mg by mouth daily.   10/25/2021   cyclobenzaprine (FLEXERIL) 10 MG tablet Take 1 tablet (10 mg total) by mouth every 8 (eight) hours as needed for muscle spasms. 30 tablet 1 Past Week   docusate sodium (COLACE) 100 MG capsule Take 1 capsule (100 mg total) by mouth 2 (two) times daily. 60 capsule 1 Past Week   ferrous sulfate 325 (65 FE) MG tablet Take 1 tablet (325 mg total) by mouth every other day. 30 tablet 1 Past Week   fluticasone (FLOVENT HFA) 44 MCG/ACT inhaler Inhale 2 puffs into the lungs in the morning and at bedtime. 1 each 12 10/25/2021   furosemide (LASIX) 20 MG tablet Take 1 tablet (20 mg total) by mouth 2 (two) times daily. 4 tablet 0 Past Week   gabapentin (NEURONTIN) 100 MG capsule Take 1 capsule (100 mg total) by mouth at bedtime. 30 capsule 0 Past Week   hydrOXYzine (ATARAX/VISTARIL) 10 MG tablet Take 1 tablet (10 mg total)  by mouth 3 (three) times daily as needed for anxiety. 30 tablet 2 10/25/2021   NIFEdipine (PROCARDIA-XL/NIFEDICAL-XL) 30 MG 24 hr tablet Take 1 tablet (30 mg total) by mouth 2 (two) times daily. 60 tablet 0 10/25/2021   omeprazole (PRILOSEC) 20 MG capsule Take 1 capsule (20 mg total) by mouth in the morning and at bedtime. 60 capsule 3 10/25/2021   oxyCODONE (OXY IR/ROXICODONE) 5 MG immediate release tablet Take 1 tablet (5 mg total) by mouth every 4 (four) hours as needed for breakthrough pain or severe pain. 20 tablet 0 Past Week   pantoprazole (PROTONIX) 40 MG tablet Take 1 tablet (40 mg total) by mouth daily. 20 tablet 1 10/25/2021   polyethylene glycol (MIRALAX / GLYCOLAX) 17 g packet Take 17 g by mouth daily. 14 each 1 Past Week   Prenatal Vit-Fe Fumarate-FA (PRENATAL COMPLETE) 14-0.4 MG TABS Take 1 tablet by mouth daily. 60 tablet 3 10/25/2021   sertraline (ZOLOFT) 100 MG tablet Take 1 tablet (100 mg total) by mouth daily. 30 tablet 3 10/25/2021    Review of Systems  Eyes:  Positive for visual disturbance (Blurry). Negative for photophobia.  Gastrointestinal:  Negative for abdominal pain.  Genitourinary:  Positive for vaginal bleeding. Negative for difficulty urinating, dysuria and vaginal discharge.  Neurological:  Negative for dizziness, light-headedness and headaches.  Physical Exam   Blood pressure (!) 144/81, pulse (!) 119, temperature 98.7 F (37.1 C), resp. rate 18, height 5\' 2"  (1.575 m), weight 110.7 kg, SpO2 100 %, not currently breastfeeding. Vitals:   10/26/21 0215 10/26/21 0230 10/26/21 0245 10/26/21 0258  BP: 125/80 123/81 123/86 120/88  Pulse: 99 91 92 97  Resp:    18  Temp:      SpO2: 100% 98% 98%   Weight:      Height:        Physical Exam Vitals reviewed.  Constitutional:      Appearance: Normal appearance. She is obese.  HENT:     Head: Normocephalic and atraumatic.  Eyes:     Conjunctiva/sclera: Conjunctivae normal.  Cardiovascular:     Rate and  Rhythm: Normal rate and regular rhythm.     Heart sounds: Normal heart sounds.  Pulmonary:     Effort: Pulmonary effort  is normal. No respiratory distress.     Breath sounds: Normal breath sounds.  Abdominal:     General: Bowel sounds are normal.     Palpations: Abdomen is soft.     Tenderness: There is no abdominal tenderness.     Comments: Incision well healed. No s/s of infection.  Musculoskeletal:     Cervical back: Normal range of motion.  Skin:    General: Skin is warm and dry.  Neurological:     Mental Status: She is alert and oriented to person, place, and time.  Psychiatric:        Mood and Affect: Mood normal.        Behavior: Behavior normal.        Thought Content: Thought content normal.    MAU Course  Procedures No results found for this or any previous visit (from the past 24 hour(s)).  MDM EKG Antihypertensives Education Assessment and Plan  30 year old Heart Palpitations Postpartum State H/O PP PreEclampsia  S/P MgSO4 Infusion  -EKG in progress. -Discussed POC -Will give procardia dosing. -Once EKG complete provider will *Review with MD and compare to previous EKG from Nov 14. *Perform physical exam  Cherre Robins 10/26/2021, 1:39 AM   Reassessment (2:21 AM)  -EKG reviewed by S. Annia Friendly, DO who reports sinus tachycardia noted with otherwise normal findings.  -Provider to bedside. -Exam performed -Patient reports that feelings of anxiety have improved. -Discussed management of anxiety with coping skills including guided imagery and relaxation methods. -Encouraged continued contact with IBH services offered by  [facility]. -Discussed patient anxiety regarding taking antihypertensives.  Reports her mother died of a massive heart attack after being prescribed, but not compliant, with antihypertensives.   -Comfort given. -Provider able to help patient identify that her mother was not compliant which likely resulted in her untimely death. Further reassured  that blood pressure medication is usually discontinued at end of Postpartum period and if not BP can be managed with changes in nutrition and implementation of exercise regimen.  -Reviewed some risks factors for hypertension including obesity, african [place] heritage, and female discussed.  -Instructed to not take Procardia dosage today. Informed that okay to take BID or once daily. -BP normotensive. -Reviewed proper way to take blood pressure and when to contact office. -Patient verbalizes understanding and without further questions. -Encouraged to call primary office or return to MAU if symptoms worsen or with the onset of new symptoms. -Discharged to home in improved condition.  Cherre Robins MSN, CNM Advanced Practice Provider, [facility] [employer]

## 2021-10-26 NOTE — MAU Note (Signed)
..  Veronica [name] is a 30 y.o. at 3 weeks PP (c-seciton) here in MAU reporting: Blurry vision that began on Sunday and heart palpitations that began last night. Denies headache or any other pain. Pain score: 0/10 Vitals:   10/26/21 0105  BP: (!) 144/81  Pulse: (!) 119  Resp: 18  Temp: 98.7 F (37.1 C)  SpO2: 100%

## 2021-10-29 ENCOUNTER — Encounter: Payer: Self-pay | Admitting: *Deleted

## 2021-11-04 ENCOUNTER — Ambulatory Visit: Payer: Self-pay | Admitting: Medical

## 2021-11-06 ENCOUNTER — Ambulatory Visit (INDEPENDENT_AMBULATORY_CARE_PROVIDER_SITE_OTHER): Payer: Self-pay | Admitting: Clinical

## 2021-11-06 DIAGNOSIS — F411 Generalized anxiety disorder: Secondary | ICD-10-CM

## 2021-11-06 NOTE — Patient Instructions (Addendum)
Center for [facility] Healthcare at [facility] for [facility] [address] [place], [place] [address] [phone] (main office) [phone] (Veryl Abril's office)  www.[email or web address] (new mom online support groups)  Behavioral Health Resources:   What if I or someone I know is in crisis?  If you are thinking about harming yourself or having thoughts of suicide, or if you know someone who is, seek help right away.  Call your doctor or mental health care provider.  Call 911 or go to a hospital emergency room to get immediate help, or ask a friend or family member to help you do these things; IF YOU ARE IN [place], YOU MAY GO TO WALK-IN URGENT CARE 24/7 at [facility] (see below)  Call the [place] National Suicide Prevention Lifeline's toll-free, 24-hour hotline at 1-800-273-TALK 418-261-8277) or TTY: 1-800-799-4 TTY 914-792-0620) to talk to a trained counselor.  If you are in crisis, make sure you are not left alone.   If someone else is in crisis, make sure he or she is not left alone   24 Hour :   [facility]  [address] [place], [place] [address] [phone] or [phone] WALK-IN URGENT CARE 24/7  [facility] Mobile Crisis: [phone]  [place] National Suicide Hotline: (617) 855-0306  Family Service of the [employer] (Domestic Violence, Rape & Victim Assistance)  873-344-6670  [facility]  [address], [place]  [address]   [phone] or [phone]   [facility] [employer] [facility]: [phone] (8am-4pm) or [phone] (after hours)        [facility], [address] [place], [place]  [phone] Fax: [phone] [email or web address] *Interpreters available *Accepts all insurance and uninsured for Urgent Care needs *Accepts Medicaid and uninsured for outpatient treatment   [facility]   Mon-Fri: 8am-5pm [address], [place], [place] [phone](NMMHW); [phone](fax) [email or web address]  *Accepts Medicare  [facility] [place], Fri: 8am-4pm [address], [place], [place] [address] [phone] (phone); [phone] (fax) ExShows.dk  *Accepts Medicare  [facility] Mon-Fri: 9am-5pm  [address] [place], [place] [phone] (phone); [phone]  MommyCollege.dk  *Accepts Medicaid  Jovita Kussmaul [facility] [address] Bea Laura [place], [place]  [phone] https://www.grant.[email or web address]   [facility], 8:30am-12pm/1pm-2:30pm [address] [place], [place] [phone] (phone); [phone] (fax) www.fspcares.org  *Accepts Medicaid, sliding-scale*Bilingual services available  [facility] Mon-Fri, 8am-7pm [address], [place], [place]  [phone](CEYEM); ([phone](fax) www.famsolutions.org  *Accepts Medicaid *Bilingual services available  [facility] Mon-Fri: 8am-5pm, Saturday by appointment only [address] [place], [place], [place] [phone] (phone); [phone] (fax) [email or web address]   [facility] [address], [place], [place] [phone] [email or web address]  *Free & reduced services for uninsured and underinsured individuals *Bilingual services for Spanish-speaking clients 21 and under  [facility], [address], [place], [place] [phone](SUORV); [phone](fax) KittenExchange.at  *Bring your own interpreter at first visit *Accepts Medicare and [facility] Mon-Fri: 9am-5:30pm [address], [place], [place] [phone] (phone), [phone] (fax) After hours  crisis line: [phone] www.[email or web address]  *Accepts Medicare and Medicaid  [employer], 8am-6pm [address], [place], [place]  [phone] (phone); [phone] (fax) http://[email or web address]  *Subsidized costs available  [facility] Mon-Fri: 8am-4pm [address] [place], [place] [phone](KOECX); [phone](fax) www.psychotherapeuticservices.com  *Accepts Medicaid  [facility] Same day  access hours: Mon-Fri, 8:30-3pm Crisis hours: Mon-Fri, 8am-5pm [address] [place], [place] [phone]  [facility] [employer] Same day access hours: Mon-Fri, 8:30-3pm Crisis hours: Mon-Fri, 8am-8pm [address] [place], [place] [phone] (phone); [phone] (fax) www.rhahealthservices.org  *Accepts Medicaid and Medicare  [facility] [place], [place], Fri: 9am-9pm Tues, Thurs: 9am-6pm [address] [place], [place], [place]  [phone] (phone); [phone] (fax) [email or web address]://[email or web address]  *(Accepts Medicare and Medicaid; payment plans available)*Bilingual services available  [facility] [address] [place], [place] [phone] (phone); [phone] (fax) www.[email or web address]   [facility] [address], [place], [place]  [phone]  RackRewards.[email or web address]  *Bilingual services available  [facility]) Mon-Thurs: 8am-8pm [address], [place], [place] [phone] (phone); [phone] (fax) ScrapbookLive.si  *Accepts Medicaid*Bilingual services available  [facility] [address] [place], [place] [phone] (phone) BrotherBig.at  *Accepts Medicaid *Bilingual services available  [facility] Mon-Fri, 9am-4:45pm [address] [place], [place] [phone] (phone); [phone]  (fax) http://[email or web address]-[email or web address]  *Accepts Medicare  [facility] Mon-Thurs: 8:30-8pm, Fri: 8:30am-7pm [address] [place], [place] (3rd floor) [phone] (phone); [phone] (fax) [email or web address].[email or web address]  *Accepts Medicaid; income-based reduced rates available  [facility] Mon-Fri: 8am-5pm [address], [place], [place] [address] [phone] (phone); [phone] (fax) http://www.[email or web address]  *Accepts Medicaid*Bilingual services available   [facility] [place])  [address], [place] [phone] www.mhag.org  *Provides direct services to individuals in recovery from mental illness, including support groups, recovery skills classes, and one on one peer support  [employer] on Mental Illness) [place] helpline: (862) 373-5477  NAMI [place] helpline: [phone] https://namiguilford.org  *A community hub for information relating to local resources and services for the friends and families of individuals living alongside a mental health condition, as well as the individuals themselves. Classes and support groups also provided

## 2021-11-07 DIAGNOSIS — Z419 Encounter for procedure for purposes other than remedying health state, unspecified: Secondary | ICD-10-CM | POA: Diagnosis not present

## 2021-11-07 NOTE — BH Specialist Note (Signed)
Integrated Behavioral Health via Telemedicine Visit  11/07/2021 Yolunda Kloos Pauley [id number]  Number of Integrated Behavioral Health visits: 6 Session Start time: 8:47  Session End time: 9:18 Total time:  31  Referring Provider: Merian Capron, MD Patient/Family location: Home [facility] Provider location: Center for [facility] Healthcare at [facility]  All persons participating in visit: Patient Baylen Wickens and [facility] Jance Siek   Types of Service: Individual psychotherapy and Telephone visit  I connected with Merlene Laughter Philipson and/or Merlene Laughter Wiltrout's  n/a  via  Telephone or Video Enabled Telemedicine Application  (Video is Caregility application) and verified that I am speaking with the correct person using two identifiers. Discussed confidentiality: Yes   I discussed the limitations of telemedicine and the availability of in person appointments.  Discussed there is a possibility of technology failure and discussed alternative modes of communication if that failure occurs.  I discussed that engaging in this telemedicine visit, they consent to the provision of behavioral healthcare and the services will be billed under their insurance.  Patient and/or legal guardian expressed understanding and consented to Telemedicine visit: Yes   Presenting Concerns: Patient and/or family reports the following symptoms/concerns: Anxiety "flaring up" this morning after waking at 2am with blood pressure at 150/90. Pt stopped taking Zoloft 4 days ago, after feeling "pressure on my head" for days prior. Upper back hurts, followed by head hurting. Pt began working out at the gym 45 minutes/day and still feels "jittery" afterwards; attributes to lack of quality sleep postpartum.  Duration of problem: Increase postpartum; Severity of problem: moderate  Patient and/or Family's Strengths/Protective Factors: Social connections, Concrete supports in place (healthy food, safe environments,  etc.), and Sense of purpose  Goals Addressed: Patient will:  Reduce symptoms of: anxiety   Demonstrate ability to: Increase healthy adjustment to current life circumstances  Progress towards Goals: Ongoing  Interventions: Interventions utilized:  Solution-Focused Strategies and Supportive Reflection Standardized Assessments completed: Not Needed  Patient and/or Family Response: Pt agrees with ongoing treatment plan  Assessment: Patient currently experiencing Generalized anxiety disorder.   Patient may benefit from continue psychoeducation and brief therapeutic interventions regarding coping with symptoms of anxiety .  Plan: Follow up with behavioral health clinician on : Three weeks Behavioral recommendations:  -Continue taking BH medication as prescribed; discuss with medical provider today any changes to medication -Continue daily workouts at the gym (32min/day) -Continue to accept referral to [facility] for outpatient ongoing therapy Referral(s): Integrated [employer] (In Clinic)  I discussed the assessment and treatment plan with the patient and/or parent/guardian. They were provided an opportunity to ask questions and all were answered. They agreed with the plan and demonstrated an understanding of the instructions.   They were advised to call back or seek an in-person evaluation if the symptoms worsen or if the condition fails to improve as anticipated.  [name], LCSW  Depression screen [facility] 2/9 11/20/2021 10/22/2021 10/02/2021 09/23/2021 08/28/2021  Decreased Interest 0 1 1 1 1   Down, Depressed, Hopeless 0 1 1 1 2   PHQ - 2 Score 0 2 2 2 3   Altered sleeping 3 0 1 1 2   Tired, decreased energy 1 1 1 2 1   Change in appetite 1 2 1  0 0  Feeling bad or failure about yourself  0 0 0 0 0  Trouble concentrating 1 0 0 0 0  Moving slowly or fidgety/restless 1 0 0 0 0  Suicidal thoughts 0 0 0 0 0  PHQ-9 Score 7 5 5 5 6   Difficult doing work/chores - - - - -   Some recent data might be hidden   GAD 7 : Generalized Anxiety Score 11/20/2021 10/22/2021 09/23/2021 08/28/2021  Nervous, Anxious, on Edge 2 1 1 3   Control/stop worrying 2 1 1 3   Worry too much - different things 2 1 1 3   Trouble relaxing 1 0 1 1  Restless 1 0 1 1  Easily annoyed or irritable 0 0 2 1  Afraid - awful might happen 1 1 1 2   Total GAD 7 Score 9 4 8 14   Anxiety Difficulty - - - -

## 2021-11-20 ENCOUNTER — Other Ambulatory Visit: Payer: Self-pay

## 2021-11-20 ENCOUNTER — Ambulatory Visit (INDEPENDENT_AMBULATORY_CARE_PROVIDER_SITE_OTHER): Payer: Medicaid Other | Admitting: Clinical

## 2021-11-20 ENCOUNTER — Ambulatory Visit (INDEPENDENT_AMBULATORY_CARE_PROVIDER_SITE_OTHER): Payer: Medicaid Other | Admitting: Certified Nurse Midwife

## 2021-11-20 DIAGNOSIS — F411 Generalized anxiety disorder: Secondary | ICD-10-CM | POA: Diagnosis not present

## 2021-11-20 DIAGNOSIS — O99345 Other mental disorders complicating the puerperium: Secondary | ICD-10-CM | POA: Diagnosis not present

## 2021-11-20 DIAGNOSIS — O165 Unspecified maternal hypertension, complicating the puerperium: Secondary | ICD-10-CM | POA: Diagnosis not present

## 2021-11-20 DIAGNOSIS — F418 Other specified anxiety disorders: Secondary | ICD-10-CM | POA: Diagnosis not present

## 2021-11-20 MED ORDER — BUPROPION HCL ER (XL) 150 MG PO TB24
150.0000 mg | ORAL_TABLET | Freq: Every day | ORAL | 3 refills | Status: DC
Start: 2021-11-20 — End: 2021-12-18

## 2021-11-20 NOTE — Patient Instructions (Signed)
Veronica [name] w/ [name] [address] [place], [place] [address] [phone] [email or web address]  

## 2021-11-20 NOTE — Progress Notes (Signed)
Post Partum Visit Note  Veronica [name] is a 30 y.o. G9P2002 female who presents for a postpartum visit. She is 6 weeks and 2 days postpartum following a repeat cesarean section.  I have fully reviewed the prenatal and intrapartum course. The delivery was at 40wk2d gestational weeks.  Anesthesia: epidural. Postpartum course has been complicated by postpartum hypertension and heightened anxiety. She was on zoloft for two years and able to decrease her dose during pregnancy. After birth, her anxiety got worse and she felt the zoloft was raising her BP and causing headaches so she stopped taking it. Feels the vistaril prescribed helps somewhat but wants to discuss increasing her dose. Also has questions about some back pain she has, as well as upper back pain that gives her a headache. Is back to working out per day but wants to make sure this is ok while on BP medicine. Has been tracking BP daily and they are in range, but she notes BP spikes when she has panic attacks so is not ready to come off her BP meds until her anxiety is better controlled. Of note - her mother died at age 76 of untreated hypertension which caused a heart attack.   Baby is doing well. Baby is feeding by bottle Rush Barer . Bleeding  is light and has been on and off . Bowel function is normal. Bladder function is normal. Patient is not sexually active. Contraception method is none. Postpartum depression screening: negative.  Upstream - 11/20/21 2015       Pregnancy Intention Screening   Does the patient want to become pregnant in the next year? No    Does the patient's partner want to become pregnant in the next year? No    Would the patient like to discuss contraceptive options today? No   With all of her perinatal anxiety, she does not want to start anything else until she has her mental health under control.     Contraception Wrap Up   Contraception Counseling Provided No            The pregnancy intention  screening data noted above was reviewed. Potential methods of contraception were discussed. The patient elected to proceed with No data recorded.   Edinburgh Postnatal Depression Scale - 11/20/21 1621       Edinburgh Postnatal Depression Scale:  In the Past 7 Days   I have been able to laugh and see the funny side of things. 0    I have looked forward with enjoyment to things. 0    I have blamed myself unnecessarily when things went wrong. 0    I have been anxious or worried for no good reason. 3    I have felt scared or panicky for no good reason. 3    Things have been getting on top of me. 0    I have been so unhappy that I have had difficulty sleeping. 0    I have felt sad or miserable. 0    I have been so unhappy that I have been crying. 0    The thought of harming myself has occurred to me. 0    Edinburgh Postnatal Depression Scale Total 6            Health Maintenance Due  Topic Date Due   COVID-19 Vaccine (1) Never done   Pneumococcal Vaccine 57-76 Years old (1 - PCV) Never done   The following portions of the patient's history were  reviewed and updated as appropriate: allergies, current medications, past family history, past medical history, past social history, past surgical history, and problem list.  Review of Systems Pertinent items noted in HPI and remainder of comprehensive ROS otherwise negative.  Objective:  BP 115/81    Pulse 76    Wt 235 lb 9.6 oz (106.9 kg)    Breastfeeding No    BMI 43.09 kg/m    General:  alert, cooperative, and appears stated age   Breasts:  not indicated  Lungs: Normal effort  Heart:  regular rate and rhythm  Abdomen: Soft, nontender    Wound well approximated incision  GU exam:  not indicated       Assessment:  Postpartum care following cesarean delivery  Postpartum anxiety - Plan: buPROPion (WELLBUTRIN XL) 150 MG 24 hr tablet  Postpartum hypertension   Plan:   Essential components of care per ACOG recommendations:  1.   Mood and well being: Patient with positive depression screening today. Reviewed local resources for support.  - Has been seeing Jamie. Validated that SSRIs can cause hypertension so offered wellbutrin instead. Validated her fear of coming off BP meds, advised her take wellbutrin for a month and we will revisit BP meds afterward.  - Patient tobacco use? No.   - hx of drug use? No.    2. Infant care and feeding:  -Patient currently breastmilk feeding? No.  -Social determinants of health (SDOH) reviewed in EPIC. No concerns  3. Sexuality, contraception and birth spacing - Patient does not want a pregnancy in the next year.  Desired family size is 2 children.  - Reviewed forms of contraception in tiered fashion. Patient desired no method, natural family planning (NFP) today.   - Discussed birth spacing of 18 months  4. Sleep and fatigue -Encouraged family/partner/community support of 4 hrs of uninterrupted sleep to help with mood and fatigue  5. Physical Recovery  - Discussed patients delivery and complications. She describes her labor as good. - Patient had a C-section.  - Patient has urinary incontinence? No. - Patient is safe to resume physical and sexual activity. Advised to stretch regularly to relieve muscle aches and tension headaches. Gave chiropractor information in AVS.  6.  Health Maintenance - HM due items addressed Yes - Last pap smear  Diagnosis  Date Value Ref Range Status  03/20/2021   Final   - Negative for intraepithelial lesion or malignancy (NILM)   Pap smear not done at today's visit.  -Breast Cancer screening indicated? No.   7. Chronic Disease/Pregnancy Condition follow up: Hypertension - Reviewed goal range for BP and advised she can stop taking BP meds if she notes BP going too low with working out and improved anxiety. Will follow up personally with patient in one month. - PCP follow up as needed  Follow up with me in 4 weeks.  Bernerd Limbo,  CNM Center for [employer], [facility]

## 2021-11-25 ENCOUNTER — Other Ambulatory Visit: Payer: Self-pay | Admitting: Obstetrics & Gynecology

## 2021-11-25 ENCOUNTER — Other Ambulatory Visit: Payer: Self-pay | Admitting: Obstetrics and Gynecology

## 2021-11-25 DIAGNOSIS — O165 Unspecified maternal hypertension, complicating the puerperium: Secondary | ICD-10-CM

## 2021-11-27 ENCOUNTER — Encounter: Payer: Self-pay | Admitting: Certified Nurse Midwife

## 2021-11-27 DIAGNOSIS — O165 Unspecified maternal hypertension, complicating the puerperium: Secondary | ICD-10-CM

## 2021-11-28 NOTE — BH Specialist Note (Signed)
Integrated Behavioral Health via Telemedicine Visit  12/12/2021 Shaiann Mcmanamon Rhett [id number]  Number of Integrated Behavioral Health visits: 7 Session Start time: 8:45  Session End time: 9:09 Total time:  24  Referring Provider: Merian Capron, MD Patient/Family location: Home [facility] Provider location: Center for [facility] at [facility] for Women  All persons participating in visit: Patient Markeia Munyan and [facility] Wyllow Seigler   Types of Service: Individual psychotherapy and Telephone visit  I connected with Merlene Laughter Briles and/or Merlene Laughter [place]'s  n/a  via  Telephone or Video Enabled Telemedicine Application  (Video is Caregility application) and verified that I am speaking with the correct person using two identifiers. Discussed confidentiality: Yes   I discussed the limitations of telemedicine and the availability of in person appointments.  Discussed there is a possibility of technology failure and discussed alternative modes of communication if that failure occurs.  I discussed that engaging in this telemedicine visit, they consent to the provision of behavioral healthcare and the services will be billed under their insurance.  Patient and/or legal guardian expressed understanding and consented to Telemedicine visit: Yes   Presenting Concerns: Patient and/or family reports the following symptoms/concerns: Worry about impact BP and anxiety are having on each other; as well as wondering how much green tea to safely drink.  Goals for this year include: Lose 40 pounds (first step: drinking unsweetened, decaffeinated green tea daily) Work on Lexicographer, as first step towards purchasing a home in the future Stay in church (attend weekly) Manage BP to no more than 120/80 consistently (take medication as prescribed) Manage anxiety to tolerable levels, with both medication and gym workouts (without negatively affecting BP) Duration of problem: Ongoing;  Severity of problem: moderate  Patient and/or Family's Strengths/Protective Factors: Social connections, Concrete supports in place (healthy food, safe environments, etc.), and Sense of purpose  Goals Addressed: Patient will:  Reduce symptoms of: anxiety   Demonstrate ability to: Increase motivation to adhere to plan of care  Progress towards Goals: Ongoing  Interventions: Interventions utilized:  Solution-Focused Strategies Standardized Assessments completed: Not Needed  Patient and/or Family Response: Pt agrees with current treatment plan; will begin ongoing therapy and BH medication management at [facility], starting in February; if needed, will access same-day walk-in services, as directed by [facility]  Assessment: Patient currently experiencing Generalized anxiety disorder.   Patient may benefit from continued brief therapeutic interventions today and establishing care for ongoing therapy and BH medication management.  Plan: Follow up with behavioral health clinician on : Follow up with [facility]; Call Murielle Stang at [phone], as needed Behavioral recommendations:  -Continue taking BH medication as prescribed -Continue using self-coping strategies, along with working on personal life goals for 2023, to help manage anxious thoughts and for overall wellbeing -Continue with plan to attend [facility] with [facility] for therapy and BH medication management  Referral(s): Integrated [employer] (In Clinic)  I discussed the assessment and treatment plan with the patient and/or parent/guardian. They were provided an opportunity to ask questions and all were answered. They agreed with the plan and demonstrated an understanding of the instructions.   They were advised to call back or seek an in-person evaluation if the symptoms worsen or if the condition fails to improve as anticipated.  Valetta Close Madie Cahn, LCSW

## 2021-12-03 ENCOUNTER — Ambulatory Visit (INDEPENDENT_AMBULATORY_CARE_PROVIDER_SITE_OTHER): Payer: Medicaid Other | Admitting: Family Medicine

## 2021-12-03 ENCOUNTER — Encounter: Payer: Self-pay | Admitting: Family Medicine

## 2021-12-03 ENCOUNTER — Other Ambulatory Visit: Payer: Self-pay

## 2021-12-03 VITALS — BP 121/83 | HR 77 | Ht 62.0 in | Wt 243.2 lb

## 2021-12-03 DIAGNOSIS — R030 Elevated blood-pressure reading, without diagnosis of hypertension: Secondary | ICD-10-CM | POA: Diagnosis not present

## 2021-12-03 DIAGNOSIS — J4520 Mild intermittent asthma, uncomplicated: Secondary | ICD-10-CM

## 2021-12-03 DIAGNOSIS — Z30011 Encounter for initial prescription of contraceptive pills: Secondary | ICD-10-CM

## 2021-12-03 DIAGNOSIS — F411 Generalized anxiety disorder: Secondary | ICD-10-CM

## 2021-12-03 MED ORDER — NORGESTIMATE-ETH ESTRADIOL 0.25-35 MG-MCG PO TABS: 1.0000 | ORAL_TABLET | Freq: Every day | ORAL | 11 refills | Status: DC

## 2021-12-03 NOTE — Progress Notes (Signed)
° ° °  SUBJECTIVE:   CHIEF COMPLAINT / HPI:   Veronica [name] is as 30 yo who presents for follow up.  She was seen for her post partum visit with OB gyn on 12/14. Depression screen was positive and was started on Wellbutrin for concern for elevated BP on SSRI.   Hx preeclampsia during pregnancy. Since delivery she has been experiencing post partum hypertension and increased anxiety and depression. She showed a picture of her BP long she has been taking at home which averages around 116-130/80s. She has not been taking her Nifedipine daily, only using it when her BP is elevated. She states she is worried about her BP increase after her panic attacks and that's when she will take her medication.  She does endorse frequent panic attacks, last one being 4 days ago and BP increased to 156/106 at that time. States mostly come on at night. Feels short of breath and feels heart racing. Endorses taking Hydroxyzine twice a day   Not taking anything for contraception, states she wants to get anxiety under control first  Additionally she states she has been using albuterol almost daily and stopped using flovent    OBJECTIVE:   BP 121/83    Pulse 77    Ht 5\' 2"  (1.575 m)    Wt 243 lb 4 oz (110.3 kg)    SpO2 98%    BMI 44.49 kg/m    General: alert, NAD CV: RRR no murmurs Resp: CTAB normal WOB GI: soft, non distended Derm: warm, dry. Well perfused Psych: mood anxious, thought content and insight normal    ASSESSMENT/PLAN:   No problem-specific Assessment & Plan notes found for this encounter.   Anxiety Advised to Continue Wellbutrin. She has also been using Hydroxyzine every day. Recently started the Wellbutrin about 2 weeks ago and discussed the goal would be to get anxiety under control so she will not need hydroxyzine daily. Increased dose from 10mg  to 15 mg and to use at night only as needed. Recommended continuing with therapy as well.    Contraception  Patient not ready for a long acting  method at this time but was interested in starting OCPs. Prescribed Sprintec.   Asthma Patient reports daily use of albuterol and had stopped taking her Flovent. This is likely due to her anxiety and frequent panic attacks. Hopefully we can get anxiety better controlled Discussed that Flovent is meant to take daily to control her symptoms and albuterol is intended to be used as needed due to side effects.   Elevated BP BP has been elevated post partum. At home ranges from 116-130/80s. She is taking Nifedipine as needed. BP today 121/83. Discussed the medication is not intended to be as needed and to continue taking it for now but can re assess at follow up  Recommended return to discuss anxiety and possibly adjusting/switching medication if no improvement, and to follow up on blood pressure and asthma  , DO [facility]

## 2021-12-03 NOTE — Patient Instructions (Addendum)
It was great seeing you today!  Today we discussed anxiety, and increasing Hydroxyzine from 10 to 25mg  as needed. You  can take 2 of your 10mg  since you just refilled it.  We discussed starting back on your Flovent so you can use less Albuterol and that should also help with your fast heart rate   I have also send in for birth control pills to your pharmacy for you to take daily at the same time.   Please check-out at the front desk before leaving the clinic. I'd like to see you back in about 2 weeks to follow up on anxiety and blood pressure, but if you need to be seen earlier than that for any new issues we're happy to fit you in, just give a call!  Feel free to call with any questions or concerns at any time, at [phone].   Take care,  Dr. [place] [facility]

## 2021-12-08 DIAGNOSIS — Z419 Encounter for procedure for purposes other than remedying health state, unspecified: Secondary | ICD-10-CM | POA: Diagnosis not present

## 2021-12-12 ENCOUNTER — Ambulatory Visit (INDEPENDENT_AMBULATORY_CARE_PROVIDER_SITE_OTHER): Payer: Medicaid Other | Admitting: Clinical

## 2021-12-12 DIAGNOSIS — F411 Generalized anxiety disorder: Secondary | ICD-10-CM | POA: Diagnosis not present

## 2021-12-16 ENCOUNTER — Other Ambulatory Visit: Payer: Self-pay

## 2021-12-16 ENCOUNTER — Other Ambulatory Visit: Payer: Self-pay | Admitting: Family Medicine

## 2021-12-16 DIAGNOSIS — D649 Anemia, unspecified: Secondary | ICD-10-CM

## 2021-12-16 NOTE — Telephone Encounter (Signed)
Patient calls nurse line requesting refills on hydroxyzine and protonix.   Patient has also requested prescriptions from OBGYN. Patient states that she discussed these prescriptions with PCP at last appointment on 12/03/21. Please advise.   Veronda Prude, RN

## 2021-12-17 ENCOUNTER — Other Ambulatory Visit: Payer: Self-pay | Admitting: Family Medicine

## 2021-12-18 ENCOUNTER — Encounter (HOSPITAL_COMMUNITY): Payer: Self-pay

## 2021-12-18 ENCOUNTER — Other Ambulatory Visit: Payer: Self-pay

## 2021-12-18 ENCOUNTER — Other Ambulatory Visit: Payer: Self-pay | Admitting: Family Medicine

## 2021-12-18 ENCOUNTER — Ambulatory Visit (HOSPITAL_COMMUNITY)
Admission: EM | Admit: 2021-12-18 | Discharge: 2021-12-18 | Disposition: A | Discharge status: Home / Self Care | Payer: Medicaid Other | Attending: Internal Medicine | Admitting: Internal Medicine

## 2021-12-18 DIAGNOSIS — F411 Generalized anxiety disorder: Secondary | ICD-10-CM

## 2021-12-18 DIAGNOSIS — R0602 Shortness of breath: Secondary | ICD-10-CM | POA: Diagnosis not present

## 2021-12-18 MED ORDER — ESCITALOPRAM OXALATE 5 MG PO TABS: 5.0000 mg | ORAL_TABLET | Freq: Every day | ORAL | 0 refills | Status: DC

## 2021-12-18 MED ORDER — HYDROXYZINE HCL 10 MG PO TABS: 10.0000 mg | ORAL_TABLET | Freq: Three times a day (TID) | ORAL | 0 refills | Status: DC | PRN

## 2021-12-18 NOTE — Discharge Instructions (Signed)
Your exam and EKG are reassuring today.  I feel your symptoms may all be related to anxiety and current medications.  As we discussed, go ahead and stop the Wellbutrin.  I am prescribing you a low-dose Lexapro.  Please start taking immediately.  Continue hydroxyzine as prior to this visit.  Should you begin experiencing any worsening symptoms, you will need to follow-up immediately.  Otherwise, call your primary provider in the morning to schedule an appointment for follow-up in 1 to 2 weeks.

## 2021-12-18 NOTE — Telephone Encounter (Signed)
Pt needs Sprintec, Ferrous sulfate and hydroxizine refilled.  They were decline by Dr. Mathis Fare (OB/GYN).  Explained the delay to patient and advised I Would ask Dr. Idalia Needle to refill those meds. Jone Baseman, CMA

## 2021-12-18 NOTE — ED Triage Notes (Signed)
Pt c/o SOB x3 days. States started a new anxiety medication 11/21/2021. States had a baby 2 months ago. No distress noted at this time. States her brain feels weird.

## 2021-12-18 NOTE — ED Provider Notes (Signed)
[facility]    CSN: [id number] Arrival date & time: 12/18/21  1539      History   Chief Complaint Chief Complaint  Patient presents with   Shortness of Breath    HPI Veronica [name] is a 31 y.o. female 3 months postpartum with history of anxiety, anemia, asthma presents to urgent care today with complaints of shortness of breath.  Patient states symptoms began approximately 3 days prior to this visit.  Patient states her anxiety is "at an all-time high".  Patient was started on Wellbutrin on 12/15 in addition to hydroxyzine as needed which she is taking regularly 3 times daily.  Patient reports 3-day history of "shakes", foggy headedness and worsening anxiety.  Patient also describes intermittent SOB and chest "heaviness".  The symptoms have never occurred with anxiety in the past.  Patient states she sleeps approximately 3 hours per night due to newborn and anxiety.  Patient states she took Zoloft prior to Wellbutrin and stopped due to frequent headaches and high blood pressure during pregnancy.  Headaches have resolved.  Patient currently taking blood pressure medication since 11/22.  Patient denies any recent headache, dizziness, abdominal pain, N/V/D, fever or chills.  She denies any SI/HI.   Past Medical History:  Diagnosis Date   Abdominal pain 01/06/2012   Acute tension headache 07/27/2019   Anemia    Ankle pain    Asthma    Birth control counseling 08/26/2011   BMI 40.0-44.9, adult (HCC) 02/27/2011   GERD (gastroesophageal reflux disease)    Hiatal hernia    Obesity (BMI 30-39.9)    Secondary amenorrhea 03/25/2018    Patient Active Problem List   Diagnosis Date Noted   Asthma 08/31/2020   Back pain 02/13/2020   Hiatal hernia 11/02/2019   Epigastric pain 09/13/2019   Subclinical hypothyroidism 05/19/2019   GAD (generalized anxiety disorder) 05/09/2019   BMI 50.0-59.9, adult (HCC) 02/27/2011   Anemia 02/27/2011    Past Surgical History:  Procedure  Laterality Date   CESAREAN SECTION  09/02/2010   For macrosomia, but son was 7 lb 12 oz   CESAREAN SECTION N/A 10/06/2021   Procedure: CESAREAN SECTION;  Surgeon: Bronson Bing, MD;  Location: [facility];  Service: Obstetrics;  Laterality: N/A;   TONSILLECTOMY AND ADENOIDECTOMY      OB History     Gravida  2   Para  2   Term  2   Preterm      AB      Living  2      SAB      IAB      Ectopic      Multiple  0   Live Births  2            Home Medications    Prior to Admission medications   Medication Sig Start Date End Date Taking? Authorizing Provider  escitalopram (LEXAPRO) 5 MG tablet Take 1 tablet (5 mg total) by mouth daily. 12/18/21  Yes Rolla Etienne, NP  NIFEdipine (PROCARDIA-XL/NIFEDICAL-XL) 30 MG 24 hr tablet Take 1 tablet by mouth twice daily 11/29/21   Bernerd Limbo, CNM  acetaminophen (TYLENOL) 500 MG tablet Take 2 tablets (1,000 mg total) by mouth every 6 (six) hours as needed for moderate pain. 10/09/21   Cheral Marker, CNM  albuterol (VENTOLIN HFA) 108 (90 Base) MCG/ACT inhaler INHALE 2 PUFFS BY MOUTH EVERY 4 HOURS AS NEEDED FOR WHEEZING OR SHORTNESS OF BREATH (COUGH). 10/15/20  Allayne Stack, DO  cetirizine (ZYRTEC) 5 MG tablet Take 5 mg by mouth daily.    [provider]  docusate sodium (COLACE) 100 MG capsule Take 1 capsule (100 mg total) by mouth 2 (two) times daily. 06/01/21   Gerrit Heck, CNM  ferrous sulfate 325 (65 FE) MG tablet Take 1 tablet (325 mg total) by mouth every other day. 08/13/21   Warner Mccreedy, MD  fluticasone (FLOVENT HFA) 44 MCG/ACT inhaler Inhale 2 puffs into the lungs in the morning and at bedtime. 09/11/21   Warden Fillers, MD  hydrOXYzine (ATARAX/VISTARIL) 10 MG tablet Take 1 tablet (10 mg total) by mouth 3 (three) times daily as needed for anxiety. 08/06/21   Worthy Rancher, MD  norgestimate-ethinyl estradiol (SPRINTEC 28) 0.25-35 MG-MCG tablet Take 1 tablet by mouth daily. 12/03/21   Cora Collum, DO  pantoprazole (PROTONIX) 40 MG tablet Take 1 tablet (40 mg total) by mouth daily. 10/22/21   Adam Phenix, MD  polyethylene glycol (MIRALAX / GLYCOLAX) 17 g packet Take 17 g by mouth daily. 06/01/21   Gerrit Heck, CNM  Prenatal Vit-Fe Fumarate-FA (PRENATAL COMPLETE) 14-0.4 MG TABS Take 1 tablet by mouth daily. 02/03/21   Linwood Dibbles, MD    Family History Family History  Problem Relation Age of Onset   Diabetes Mother    Hypertension Mother    Obesity Mother    Heart attack Mother 48       Death   Obesity Sister    Osteochondroma Sister    Diabetes Sister    Hypertension Sister     Social History Social History   Tobacco Use   Smoking status: Never   Smokeless tobacco: Never  Vaping Use   Vaping Use: Never used  Substance Use Topics   Alcohol use: No   Drug use: Not Currently    Comment: last used in teens     Allergies   Morphine and related and Motrin [ibuprofen]   Review of Systems Review of Systems   Physical Exam Triage Vital Signs ED Triage Vitals  Enc Vitals Group     BP 12/18/21 1601 (!) 120/58     Pulse Rate 12/18/21 1601 78     Resp 12/18/21 1601 18     Temp 12/18/21 1601 99.3 F (37.4 C)     Temp Source 12/18/21 1601 Oral     SpO2 12/18/21 1601 99 %     Weight --      Height --      Head Circumference --      Peak Flow --      Pain Score 12/18/21 1603 0     Pain Loc --      Pain Edu? --      Excl. in GC? --    No data found.  Updated Vital Signs BP (!) 120/58 (BP Location: Right Arm)    Pulse 78    Temp 99.3 F (37.4 C) (Oral)    Resp 18    SpO2 99%    Breastfeeding No   Visual Acuity Right Eye Distance:   Left Eye Distance:   Bilateral Distance:    Right Eye Near:   Left Eye Near:    Bilateral Near:     Physical Exam Constitutional:      General: She is not in acute distress.    Appearance: She is well-developed. She is obese. She is not ill-appearing or toxic-appearing.  HENT:     Mouth/Throat:  Mouth:  Mucous membranes are moist.     Pharynx: Oropharynx is clear.  Eyes:     Extraocular Movements: Extraocular movements intact.  Neck:     Thyroid: No thyromegaly.  Cardiovascular:     Rate and Rhythm: Normal rate and regular rhythm.     Heart sounds: No murmur heard.   No friction rub. No gallop.     Comments: No lower extremity edema Pulmonary:     Effort: Pulmonary effort is normal. No tachypnea or accessory muscle usage.     Breath sounds: Normal breath sounds. No wheezing, rhonchi or rales.  Chest:     Chest wall: No tenderness or edema.  Abdominal:     General: Bowel sounds are normal.     Palpations: Abdomen is soft.     Tenderness: There is no abdominal tenderness.  Musculoskeletal:        General: Normal range of motion.     Cervical back: Normal range of motion and neck supple.  Skin:    General: Skin is warm and dry.  Neurological:     General: No focal deficit present.     Mental Status: She is alert.  Psychiatric:        Mood and Affect: Mood normal.        Behavior: Behavior normal.     UC Treatments / Results  Labs (all labs ordered are listed, but only abnormal results are displayed) Labs Reviewed - No data to display  EKG   Radiology No results found.  Procedures Procedures (including critical care time)  Medications Ordered in UC Medications - No data to display  Initial Impression / Assessment and Plan / UC Course  I have reviewed the triage vital signs and the nursing notes.  Pertinent labs & imaging results that were available during my care of the patient were reviewed by me and considered in my medical decision making (see chart for details).  Generalized Anxiety disorder SOB -long history of anxiety on SSRI in past, but unable to tolerate postpartum due to headaches and for side effect of high BP in setting of preeclampsia. She states her anxiety was well-controlled with Zoloft prior to this -SOB seemingly related to increased anxiety.   Exam unremarkable.  EKG within normal limits with no evidence of ischemia -Increased anxiety and brain fog may be a side effect of Wellbutrin started 12/15.  Denies SI/HI.  Pleasant affect on exam -Blood pressure well controlled at this time on medication.  I believe it is reasonable to discontinue Wellbutrin.  We will start Lexapro low-dose at 5 mg daily.  Lexapro may be better at managing anxiety then other SSRI and may work faster.  Continue hydroxyzine as needed -Patient to call PCP office in a.m. to reschedule appointment to be seen in the next 1 to 2 weeks  Reviewed expections re: course of current medical issues. Questions answered. Outlined signs and symptoms indicating need for more acute intervention. Pt verbalized understanding. AVS given    Final Clinical Impressions(s) / UC Diagnoses   Final diagnoses:  Generalized anxiety disorder  SOB (shortness of breath)     Discharge Instructions      Your exam and EKG are reassuring today.  I feel your symptoms may all be related to anxiety and current medications.  As we discussed, go ahead and stop the Wellbutrin.  I am prescribing you a low-dose Lexapro.  Please start taking immediately.  Continue hydroxyzine as prior to this visit.  Should you begin  experiencing any worsening symptoms, you will need to follow-up immediately.  Otherwise, call your primary provider in the morning to schedule an appointment for follow-up in 1 to 2 weeks.     ED Prescriptions     Medication Sig Dispense Auth. Provider   escitalopram (LEXAPRO) 5 MG tablet Take 1 tablet (5 mg total) by mouth daily. 30 tablet Rudolpho Sevin, NP      PDMP not reviewed this encounter.   Rudolpho Sevin, NP 12/18/21 2013

## 2021-12-19 MED ORDER — HYDROXYZINE HCL 10 MG PO TABS: 10.0000 mg | ORAL_TABLET | Freq: Three times a day (TID) | ORAL | 2 refills | Status: DC | PRN

## 2021-12-19 MED ORDER — NORGESTIMATE-ETH ESTRADIOL 0.25-35 MG-MCG PO TABS: 1.0000 | ORAL_TABLET | Freq: Every day | ORAL | 11 refills | Status: DC

## 2021-12-19 MED ORDER — FERROUS SULFATE 325 (65 FE) MG PO TABS: 325.0000 mg | ORAL_TABLET | ORAL | 1 refills | Status: DC

## 2021-12-20 ENCOUNTER — Other Ambulatory Visit: Payer: Self-pay

## 2021-12-20 ENCOUNTER — Ambulatory Visit (INDEPENDENT_AMBULATORY_CARE_PROVIDER_SITE_OTHER): Payer: Medicaid Other | Admitting: Family Medicine

## 2021-12-20 DIAGNOSIS — O99519 Diseases of the respiratory system complicating pregnancy, unspecified trimester: Secondary | ICD-10-CM

## 2021-12-20 DIAGNOSIS — J4520 Mild intermittent asthma, uncomplicated: Secondary | ICD-10-CM | POA: Diagnosis not present

## 2021-12-20 DIAGNOSIS — F411 Generalized anxiety disorder: Secondary | ICD-10-CM

## 2021-12-20 DIAGNOSIS — J45909 Unspecified asthma, uncomplicated: Secondary | ICD-10-CM

## 2021-12-20 MED ORDER — FLUTICASONE PROPIONATE 50 MCG/ACT NA SUSP: 1.0000 | Freq: Every day | NASAL | 12 refills | Status: DC | PRN

## 2021-12-20 MED ORDER — HYDROXYZINE HCL 25 MG PO TABS: 25.0000 mg | ORAL_TABLET | Freq: Three times a day (TID) | ORAL | 0 refills | Status: DC | PRN

## 2021-12-20 MED ORDER — FLUTICASONE PROPIONATE HFA 110 MCG/ACT IN AERO: 2.0000 | INHALATION_SPRAY | Freq: Two times a day (BID) | RESPIRATORY_TRACT | 12 refills | Status: DC

## 2021-12-20 NOTE — Assessment & Plan Note (Signed)
Panic and anxiety attacks likely were exacerbated by bupropion.  Recommended that she start taking the escitalopram.  Increased hydroxyzine to 25 mg 3 times daily as needed with cautious use.

## 2021-12-20 NOTE — Assessment & Plan Note (Signed)
Increased Flovent to next highest dose 110.  Hopeful that this will decrease her albuterol use which may exacerbate her anxiety.  Do not feel that she is in acute exacerbation requiring systemic corticosteroids which may also worsen anxiety.  Patient's concern for wheezing appreciated during encounter sounds more like nasal congestion. - increase Flovent  - continue albuterol prn - Flonase for congestion

## 2021-12-20 NOTE — Patient Instructions (Addendum)
It was nice seeing you today!  Recommend starting Lexapro.  Increase hydroxyzine to 25 mg up to three times a day as needed. Try to avoid taking this if you can.  Increase Flonase to the next highest dose. Continue doing 2 puffs twice a day.  Stay well, Littie Deeds, MD [facility] [phone]  --  Make sure to check out at the front desk before you leave today.  Please arrive at least 15 minutes prior to your scheduled appointments.  If you had blood work today, I will send you a MyChart message or a letter if results are normal. Otherwise, I will give you a call.  If you had a referral placed, they will call you to set up an appointment. Please give [place] a call if you don't hear back in the next 2 weeks.  If you need additional refills before your next appointment, please call your pharmacy first.

## 2021-12-20 NOTE — Progress Notes (Signed)
SUBJECTIVE:   CHIEF COMPLAINT / HPI: SOB, anxiety  Evaluated in urgent care 1/11 for anxiety and shortness of breath.  EKG done was unremarkable.  She has had a lot of anxiety and is also taking care of a newborn born 10/2021.  See chart for further details.  Bupropion was discontinued and she was switched to escitalopram 5 mg daily.  Also continued on hydroxyzine as needed.  Today, patient states that she has had issues of shortness of breath for the past 4 to 5 days.  Typically worse with activity.  She has been using her albuterol about twice per day which will help with her symptoms.  She states that she has been wheezing some.  She is still having some anxiety/panic attacks since bupropion was discontinued.  She has been hesitant to start escitalopram given how she responded to bupropion.  No prior history of hospitalizations or steroid courses for asthma per patient.  Denies smoking or smoke exposure.  No pets at home.  No recent illnesses.  She is asking if it safe for her to exercise being on an antihypertensive.  PERTINENT  PMH / PSH: asthma, anemia, GAD  OBJECTIVE:   BP 123/81    Pulse 88    Ht 5\' 2"  (1.575 m)    Wt 246 lb (111.6 kg)    SpO2 99%    Breastfeeding No    BMI 44.99 kg/m   General: Alert, NAD CV: RRR, no murmurs Pulm: CTAB, no wheezes or rales, no respiratory distress.  Faint transmitted upper airway sounds  Depression screen [facility] 2/9 12/20/2021 12/03/2021 11/20/2021 10/22/2021 10/02/2021  Decreased Interest 0 0 0 1 1  Down, Depressed, Hopeless 0 0 0 1 1  PHQ - 2 Score 0 0 0 2 2  Altered sleeping 1 1 3  0 1  Tired, decreased energy 0 1 1 1 1   Change in appetite 2 1 1 2 1   Feeling bad or failure about yourself  0 0 0 0 0  Trouble concentrating 0 1 1 0 0  Moving slowly or fidgety/restless 0 0 1 0 0  Suicidal thoughts 0 0 0 0 0  PHQ-9 Score 3 4 7 5 5   Difficult doing work/chores Not difficult at all Not difficult at all - - -  Some recent data might be hidden     GAD 7 : Generalized Anxiety Score 12/20/2021 11/20/2021 10/22/2021 09/23/2021  Nervous, Anxious, on Edge 1 2 1 1   Control/stop worrying 1 2 1 1   Worry too much - different things 1 2 1 1   Trouble relaxing 0 1 0 1  Restless 0 1 0 1  Easily annoyed or irritable 0 0 0 2  Afraid - awful might happen 1 1 1 1   Total GAD 7 Score 4 9 4 8   Anxiety Difficulty - - - -      ASSESSMENT/PLAN:   I believe dyspnea may be due to a combination of anxiety and asthma  GAD (generalized anxiety disorder) Panic and anxiety attacks likely were exacerbated by bupropion.  Recommended that she start taking the escitalopram.  Increased hydroxyzine to 25 mg 3 times daily as needed with cautious use.  Asthma Increased Flovent to next highest dose 110.  Hopeful that this will decrease her albuterol use which may exacerbate her anxiety.  Do not feel that she is in acute exacerbation requiring systemic corticosteroids which may also worsen anxiety.  Patient's concern for wheezing appreciated during encounter sounds more like nasal congestion. -  increase Flovent  - continue albuterol prn - Flonase for congestion   Discussed that she can safely exercise despite being on an antihypertensive.  Zola Button, MD [place]

## 2021-12-27 ENCOUNTER — Other Ambulatory Visit: Payer: Self-pay

## 2022-01-01 ENCOUNTER — Other Ambulatory Visit: Payer: Self-pay

## 2022-01-01 ENCOUNTER — Ambulatory Visit (INDEPENDENT_AMBULATORY_CARE_PROVIDER_SITE_OTHER): Payer: Medicaid Other | Admitting: Certified Nurse Midwife

## 2022-01-01 ENCOUNTER — Encounter: Payer: Self-pay | Admitting: Certified Nurse Midwife

## 2022-01-01 VITALS — BP 120/78 | HR 76 | Wt 239.0 lb

## 2022-01-01 DIAGNOSIS — O99345 Other mental disorders complicating the puerperium: Secondary | ICD-10-CM | POA: Diagnosis not present

## 2022-01-01 DIAGNOSIS — F418 Other specified anxiety disorders: Secondary | ICD-10-CM | POA: Diagnosis not present

## 2022-01-01 DIAGNOSIS — O165 Unspecified maternal hypertension, complicating the puerperium: Secondary | ICD-10-CM

## 2022-01-02 NOTE — Progress Notes (Signed)
History:  Ms. Veronica [name] is a 31 y.o. G2P2002 who presents to clinic today as additional postpartum follow up regarding her postpartum hypertension and anxiety. She is still taking her BP meds with BP trending down (100s-110s/70s-80s) but has spikes with anxiety into the 130s/80s. Is seeing her PCP next week for management of her BP meds. Was on Wellbutrin and Lexapro, but stopped taking the Wellbutrin due to side effects and is feeling better on the Lexapro alone. Considering going up on her Lexapro but may want to wait.  Continues to work out regularly and wants to know if it's safe to do so while on BP meds and if she can drink green tea.   The following portions of the patient's history were reviewed and updated as appropriate: allergies, current medications, family history, past medical history, social history, past surgical history and problem list.  Review of Systems:  Review of Systems  Constitutional:  Negative for chills and fever.  HENT:  Negative for congestion.   Respiratory:  Negative for cough.   Cardiovascular:  Negative for chest pain and palpitations.  Gastrointestinal:  Negative for heartburn, nausea and vomiting.  Genitourinary:  Negative for dysuria.  Neurological:  Negative for dizziness and headaches.  Psychiatric/Behavioral:  The patient is nervous/anxious (less so without wellbutrin).    Objective:  Physical Exam BP 120/78    Pulse 76    Wt 239 lb (108.4 kg)    LMP 12/12/2021    Breastfeeding No    BMI 43.71 kg/m  Physical Exam Vitals and nursing note reviewed.  Constitutional:      Appearance: Normal appearance.  HENT:     Head: Normocephalic and atraumatic.  Eyes:     Pupils: Pupils are equal, round, and reactive to light.  Cardiovascular:     Rate and Rhythm: Normal rate and regular rhythm.     Pulses: Normal pulses.  Pulmonary:     Effort: Pulmonary effort is normal.  Abdominal:     Palpations: Abdomen is soft.  Musculoskeletal:        General:  Normal range of motion.  Skin:    General: Skin is warm and dry.     Capillary Refill: Capillary refill takes less than 2 seconds.  Neurological:     Mental Status: She is alert and oriented to person, place, and time.  Psychiatric:        Mood and Affect: Mood normal.        Behavior: Behavior normal.        Thought Content: Thought content normal.   Labs and Imaging No results found for this or any previous visit (from the past 24 hour(s)).  No results found.   Assessment & Plan:  Postpartum hypertension - Stable on procardia 30mg  daily, advised to keep exercising and losing weight, can come off BP when spikes are into stable range or she stops having them because her anxiety is well-managed. Will follow up with PCP for further management.  Postpartum anxiety - Currently stable on Lexapro, wants to take another couple of weeks to let her system settle after stopping Wellbutrin. Will evaluate at that time to see if she needs to go up on her Lexapro. Will discuss with and let me know if an increase is needed.   PRN follow up or for annual exam.   Asher Muir, CNM 01/02/2022 4:05 PM

## 2022-01-06 ENCOUNTER — Encounter: Payer: Self-pay | Admitting: Family Medicine

## 2022-01-06 ENCOUNTER — Ambulatory Visit (INDEPENDENT_AMBULATORY_CARE_PROVIDER_SITE_OTHER): Payer: Medicaid Other | Admitting: Family Medicine

## 2022-01-06 ENCOUNTER — Other Ambulatory Visit: Payer: Self-pay

## 2022-01-06 VITALS — BP 124/68 | HR 81 | Wt 239.0 lb

## 2022-01-06 DIAGNOSIS — F411 Generalized anxiety disorder: Secondary | ICD-10-CM | POA: Diagnosis not present

## 2022-01-06 DIAGNOSIS — R519 Headache, unspecified: Secondary | ICD-10-CM

## 2022-01-06 DIAGNOSIS — I10 Essential (primary) hypertension: Secondary | ICD-10-CM | POA: Diagnosis present

## 2022-01-06 NOTE — Patient Instructions (Addendum)
It was great seeing you today!  You came in to follow up on your blood pressure and I am glad to see it has been in a good range! You can take one pill a day for the next week and then stop taking them completely.   For your headaches with blurry vision I do recommend getting your vision checked. Also recommend increasing water intake because that could also be contributing. You can use tylenol or Excedrin for your headaches.   Please check-out at the front desk before leaving the clinic. I'd like to see you back in about 3-4 weeks to check on your blood pressure after stopping the medicine and to check on your headaches, but if you need to be seen earlier than that for any new issues we're happy to fit you in, just give [place] a call!  Feel free to call with any questions or concerns at any time, at [phone].   Take care,  Dr. Cora Collum [facility]

## 2022-01-06 NOTE — Progress Notes (Signed)
° ° °  SUBJECTIVE:   CHIEF COMPLAINT / HPI:   Ms. Bostic is a 31 yo who presents to follow up on her post partum hypertension. She was diagnosed after her pregnancy in November of 2022. She has been taking Procardia 30mg  daily and is hoping to be able to come off of it. She brought in her  BP log from home which looks very reassuring with most measurements between 115-120/65-80. She brought 2 of her blood pressure cuffs in the clinic to be evaluated for accuracy.   She denies chest pain but does endorse headaches with blurry vision occasionally. Wakes up with headaches on one side of head, some times occurs at night as well. Sometimes has blurry vision associated. Not worsened by light or sound. Has not had eyes checked. She thought it was due to her BP but states she would check her BP and it would be normal. Denies current headache or vision changes. States tylenol helps.   Additionally she endorses less anxiety since being on the Lexapro, endorses less panic attacks and with milder symptoms. States she has them about once a week, down from a few times a week. Also endorses exercising more.    OBJECTIVE:   BP 124/68    Pulse 81    Wt 239 lb (108.4 kg)    LMP 12/12/2021    SpO2 97%    BMI 43.71 kg/m    Physical exam General: well appearing, NAD Cardiovascular: RRR, no murmurs Lungs: CTAB. Normal WOB Abdomen: soft, non-distended Skin: warm, dry. No edema Neuro: CNs in tact. No focal deficits. Sensation in tact. Normal gait   ASSESSMENT/PLAN:   No problem-specific Assessment & Plan notes found for this encounter.   Post partum HTN BP today 124/68. She has been measuring her blood pressure at home almost daily which averaged around  115-120/65-80. She has been taking Procardia 30mg  BID. She brought her 2 BP cuffs which we checked and they both were very close to her measurement in the clinic. Given her normotensive Bps we discussed coming off of the mediation. She will take 1 tablet daily  for 1 week and then come off of it completely. Advised her to take some measurements once she is off of it and we will also follow up in clinic to check on her BP in about a month. Patient agreeable with plan.   GAD Panic attacks down to once a week with much more mild symptoms, not feeling as short of breath. Doing well on Lexapro 5mg .  Was recently discontinued from Wellbutrin. Has been using Hydroxyzine 25mg  BID. Recommended continuing current regimen as well as continuing with therapy. Can consider increasing Lexapro dose at follow up.   Headaches  Endorses occasional unilateral headaches in the morning or night a couple of times a week that resolves with Tylenol. Does have blurry vision associated. Not worsened with light or sound. Denied current headache. Physical exam reassuring without any focal deficits. Temples non tender to palpation. Given this was briefly discussed toward the end of the visit, recommended discussing more at follow up. For now advised to check on vision, and provided a list of places to be seen. Also discussed staying well hydrated as that could contribute as well. Recommended continuing Tylenol or Excedrin.    02/09/2022, DO [facility]

## 2022-01-08 ENCOUNTER — Encounter: Payer: Self-pay | Admitting: Certified Nurse Midwife

## 2022-01-08 DIAGNOSIS — Z419 Encounter for procedure for purposes other than remedying health state, unspecified: Secondary | ICD-10-CM | POA: Diagnosis not present

## 2022-01-28 ENCOUNTER — Other Ambulatory Visit: Payer: Self-pay | Admitting: Family Medicine

## 2022-01-28 DIAGNOSIS — O165 Unspecified maternal hypertension, complicating the puerperium: Secondary | ICD-10-CM

## 2022-01-28 MED ORDER — NIFEDIPINE ER OSMOTIC RELEASE 30 MG PO TB24: 30.0000 mg | ORAL_TABLET | Freq: Two times a day (BID) | ORAL | 0 refills | Status: DC

## 2022-01-29 ENCOUNTER — Other Ambulatory Visit: Payer: Self-pay

## 2022-01-29 ENCOUNTER — Ambulatory Visit (INDEPENDENT_AMBULATORY_CARE_PROVIDER_SITE_OTHER): Payer: Medicaid Other | Admitting: Licensed Clinical Social Worker

## 2022-01-29 DIAGNOSIS — F411 Generalized anxiety disorder: Secondary | ICD-10-CM | POA: Diagnosis not present

## 2022-01-29 NOTE — Progress Notes (Signed)
Comprehensive Clinical Assessment (CCA) Note  01/29/2022 Veronica [name] [id number]  Chief Complaint:  Chief Complaint  Patient presents with   Anxiety   Visit Diagnosis: GAD   Client is a 31 year old female. [facility]   Client states mental health symptoms as evidenced by:   Depression Difficulty Concentrating; Increase/decrease in appetite; Fatigue; Irritability; Sleep (too much or little) Difficulty Concentrating; Increase/decrease in appetite; Fatigue; Irritability; Sleep (too much or little)  Duration of Depressive Symptoms Greater than two weeks Greater than two weeks      Anxiety Irritability; Fatigue; Tension; Worrying; Restlessness; Sleep; Difficulty concentrating Irritability; Fatigue; Tension; Worrying; Restlessness; Sleep; Difficulty concentrating  Psychosis None None  Trauma N/A N/A  Obsessions N/A N/A  Compulsions N/A N/A  Inattention N/A N/A  Hyperactivity/Impulsivity N/A N/A  Oppositional/Defiant Behaviors N/A N/A  Emotional Irregularity N/A N/A     Client denies suicidal and homicidal ideations at this time  Client denies hallucinations and delusions at this time    Client was screened for the following SDOH: Stress\tension  Assessment Information that integrates subjective and objective details with a therapist's professional interpretation:   Patient was alert and oriented x5.  Patient was pleasant, cooperative, maintained good eye contact.  She engaged well in therapy session was dressed casually.  Patient presented today with anxious mood\affect.  Patient was referred by her OB[facility]GYN at [facility].  Patient reports increased anxiety symptoms for: Tension, worry, restlessness, rapid thoughts, and sleep.  Patient reports [facility]  Patient also was agreeable to start medication management.  She has an appointment tomorrow as 1 PM.    Patient reports  increased anxiety due to vivid dreams of despair, grief and loss due to her mother's death, and her current illness.  Patient reports that she was diagnosed with hypertension and now has to take blood pressure medication.  She reports increased anxiety due to her mother passing away from a heart attack caused by not taking her blood pressure medications.  Patient states that she has a lot of reflection on her life due to her 2 children as she wants to be there for them for a long time.  Patient reports good support through her relationship, church, family\friends.  Cantina reports that she prefers female therapist over female therapist.  LCSW was agreeable to refer patient to female therapist.  Client meets criteria for: GAD  Client states use of the following substances: Non reported    Treatment recommendations are include plan referral to female therapist per patient's request..      Client was in agreement with treatment recommendations.    CCA Screening, Triage and Referral (STR)  Patient Reported Information How did you hear about [place]? No data recorded Referral name: referral from [facility] do you see for routine medical problems? Primary Care  Practice/Facility Name: [facility] DO with Family medicine  What Do You Feel Would Help You the Most Today? Treatment for Depression or other mood problem; Medication(s)   Have You Recently Been in Any Inpatient Treatment (Hospital/Detox/Crisis Center/28-Day Program)? No   Have You Ever Received Services From [employer] Before? Yes  [facility]   Have You Recently Had Any Thoughts About Hurting Yourself? No  Are You Planning to Commit Suicide/Harm Yourself At This time? No   Have you Recently Had Thoughts About  Hurting Someone [name]? No    Have You Used Any Alcohol or Drugs in the Past 24 Hours? No   Do You Currently Have a Therapist/Psychiatrist? No   Have You Been  Recently Discharged From Any Office Practice or Programs? No     CCA Screening Triage Referral Assessment Type of Contact: Face-to-Face   ved or ever been involved? Never  Is APS involved or ever been involved? Never   Patient Determined To Be At Risk for Harm To Self or Others Based on Review of Patient Reported Information or Presenting Complaint? No    Location of Assessment: [facility]   Does Patient Present under Involuntary Commitment? No data recorded IVC Papers Initial File Date: No data recorded  [place] of Residence: [place]    CCA Biopsychosocial Intake/Chief Complaint:  [facility]erred [facility]. Pt reports dreams that she worries about. She worries about her children and pt illness such hyptertension. Pt reports mother passed away from hypertension for not taking BP medications.  Current Symptoms/Problems: tension, worry, restless, palpuations, rapid thoughts, and nightmares   Patient Reported Schizophrenia/Schizoaffective Diagnosis in Past: No   Strengths: willing to engage in treatment,  Preferences: therapy and medication mgnt  Abilities: gym, listening to music, bowling,   Type of Services Patient Feels are Needed: therapy and medication mgnt   Initial Clinical Notes/Concerns: No data recorded  Mental Health Symptoms Depression:   Difficulty Concentrating; Increase/decrease in appetite; Fatigue; Irritability; Sleep (too much or little)   Duration of Depressive symptoms:  Greater than two weeks   Mania:  No data recorded  Anxiety:    Irritability; Fatigue; Tension; Worrying; Restlessness; Sleep; Difficulty concentrating   Psychosis:   None   Duration of Psychotic symptoms: No data recorded  Trauma:   N/A   Obsessions:   N/A   Compulsions:   N/A   Inattention:   N/A   Hyperactivity/Impulsivity:   N/A   Oppositional/Defiant Behaviors:   N/A   Emotional Irregularity:   N/A   Other  Mood/Personality Symptoms:  No data recorded   Mental Status Exam Appearance and self-care  Stature:   Average   Weight:   Average weight   Clothing:   Casual   Grooming:   Normal   Cosmetic use:   None   Posture/gait:   Normal   Motor activity:   Not Remarkable   Sensorium  Attention:   Normal   Concentration:   Normal   Orientation:   X5   Recall/memory:   Normal   Affect and Mood  Affect:   Anxious   Mood:   Anxious   Relating  Eye contact:   Normal   Facial expression:   Anxious   Attitude toward examiner:   Cooperative   Thought and Language  Speech flow:  Clear and Coherent   Thought content:   Appropriate to Mood and Circumstances   Preoccupation:   None   Hallucinations:   None   Organization:  No data recorded  [employer] of Knowledge:   Fair   Intelligence:   Average   Abstraction:   Concrete   Judgement:   Fair   Reality Testing:  No data recorded  Insight:   Fair   Decision Making:   Normal   Social Functioning  Social Maturity:   Isolates   Social Judgement:  No data recorded  Stress  Stressors:   Illness; Work; Grief/losses   Coping Ability:   Overwhelmed  Skill Deficits:   Interpersonal   Supports:   Church; Family; Friends/Service system     Religion: Religion/Spirituality Are You A Religious Person?: Yes What is Your Religious Affiliation?: International aid/development worker: Leisure / Recreation Do You Have Hobbies?: Yes Leisure and Hobbies: bowling and working out  Exercise/Diet: Exercise/Diet Do You Exercise?: Yes What Type of Exercise Do You Do?: Weight Training How Many Times a Week Do You Exercise?: 1-3 times a week Have You Gained or Lost A Significant Amount of Weight in the Past Six Months?: Yes-Lost Number of Pounds Lost?: 04/09/1981 (pt had a baby 4 months ago.) Do You Follow a Special Diet?: No Do You Have Any Trouble Sleeping?: Yes Explanation of Sleeping  Difficulties: staying asleep.   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 How Long has Patient Been Employed?: 3 months Are You Satisfied With Your Job?: Yes Do You Work More Than One Job?: No Work Stressors: none reported Patient's Job has Been Impacted by Current Illness: No What is the Longest Time Patient has Held a Job?: 6 years Where was the Patient Employed at that Time?: [employer] Has Patient ever Been in the [employer]?: No  Education: Education Last Grade Completed: 12 Did Teacher, adult education From [employer]?: Yes Did Physicist, medical?: No Did Heritage manager?: No Did You Have An Individualized Education Program (IIEP): No Did You Have Any Difficulty At [employer]?: No Patient's Education Has Been Impacted by Current Illness: No   CCA Family/Childhood History Family and Relationship History: Family history Marital status: Long term relationship Long term relationship, how long?: 7 years What types of issues is patient dealing with in the relationship?: none reported Are you sexually active?: Yes What is your sexual orientation?: hetrosexual Has your sexual activity been affected by drugs, alcohol, medication, or emotional stress?: none Does patient have children?: Yes How many children?: 2 How is patient's relationship with their children?: Good overall  Childhood History:  Childhood History By whom was/is the patient raised?: Mother Description of patient's relationship with caregiver when they were a child: Mother: Good Patient's description of current relationship with people who raised him/her: Mother Deceased in 09-Apr-2016 from an MI Does patient have siblings?: Yes Number of Siblings: 2 Description of patient's current relationship with siblings: good Did patient suffer any verbal/emotional/physical/sexual  abuse as a child?: No Did patient suffer from severe childhood neglect?: No Has patient ever been sexually abused/assaulted/raped as an adolescent or adult?: No Was the patient ever a victim of a crime or a disaster?: No Witnessed domestic violence?: No Has patient been affected by domestic violence as an adult?: No  Child/Adolescent Assessment:     CCA Substance Use Alcohol/Drug Use: Alcohol / Drug Use History of alcohol / drug use?: No history of alcohol / drug abuse   DSM5 Diagnoses: Patient Active Problem List   Diagnosis Date Noted   Asthma 08/31/2020   Back pain 02/13/2020   Hiatal hernia 11/02/2019   Epigastric pain 09/13/2019   Subclinical hypothyroidism 05/19/2019   GAD (generalized anxiety disorder) 05/09/2019   BMI 50.0-59.9, adult ([place]) 02/27/2011   Anemia 02/27/2011     Collaboration of Care: Referral to [facility] outpatient therapy female therapist  Patient/Guardian was advised Release of Information must be obtained prior to any record release in order to collaborate their care with an outside provider. Patient/Guardian was advised if they have not already  done so to contact the registration department to sign all necessary forms in order for [place] to release information regarding their care.   Consent: Patient/Guardian gives verbal consent for treatment and assignment of benefits for services provided during this visit. Patient/Guardian expressed understanding and agreed to proceed.   Dory Horn, LCSW

## 2022-01-29 NOTE — Plan of Care (Signed)
Pt agreeable to plan  ?

## 2022-01-30 ENCOUNTER — Ambulatory Visit (INDEPENDENT_AMBULATORY_CARE_PROVIDER_SITE_OTHER): Payer: Medicaid Other | Admitting: Student in an Organized Health Care Education/Training Program

## 2022-01-30 ENCOUNTER — Encounter (HOSPITAL_COMMUNITY): Payer: Self-pay | Admitting: Student in an Organized Health Care Education/Training Program

## 2022-01-30 VITALS — BP 127/71 | HR 70 | Ht 62.0 in | Wt 243.0 lb

## 2022-01-30 DIAGNOSIS — F411 Generalized anxiety disorder: Secondary | ICD-10-CM | POA: Diagnosis not present

## 2022-01-30 MED ORDER — HYDROXYZINE HCL 25 MG PO TABS: 25.0000 mg | ORAL_TABLET | Freq: Three times a day (TID) | ORAL | 1 refills | Status: DC | PRN

## 2022-01-30 MED ORDER — ESCITALOPRAM OXALATE 5 MG PO TABS: 5.0000 mg | ORAL_TABLET | Freq: Every day | ORAL | 1 refills | Status: DC

## 2022-01-30 NOTE — Progress Notes (Signed)
Psychiatric Initial Adult Assessment   Patient Identification: Veronica [name] MRN:  [id number] Date of Evaluation:  01/30/2022 Referral Source: Cora CollumPaige, Victoria J, DO Chief Complaint:   Chief Complaint  Patient presents with   New Patient (Initial Visit)    in person, new mm   Visit Diagnosis: No diagnosis found.  History of Present Illness:  Veronica [name] is a 31 yr old female who presents to establish care.  PPHx is significant for GAD with no hospitalizations.   She reports that she was first diagnosed with anxiety in 2020.  She states she was started on Zoloft at that time.  She reports no identifiable triggers around that time reporting no significant change in relationship, deaths in the family, or change in job.  She states the only thing she can think of is that started after eating processed food.  She states that she was on Zoloft until her pregnancy.  She states that around her delivery she was diagnosed with preeclampsia and her medication was changed to Wellbutrin.  She states that she had a negative reaction to this medication having shortness of breath, brain fog, jitteriness, and something different with her eyes.  She states that she went to the urgent care in January and was started on Lexapro.  She states that since then her anxiety has been better controlled.  She states that she is all around doing better.  She reports no history of hospitalizations.  She reports no other diagnoses.  She reports previous medications only being Zoloft, Wellbutrin, and Lexapro.  She reports no history of suicide attempts.  She reports no history of self-injurious behavior.  She reports no known family history of diagnoses, substance abuse, or suicides.  She currently lives with her boyfriend, 30 year old child, and 564-month-old son.  She currently works in home health care.  She reports she feels safe where she lives.  She reports no alcohol use, tobacco use, or illicit substance use.  She  reports anxiety and occasional panic attacks.  She reports prior to delivery no depressive symptoms but after the birth of her son does report decreased sleep, fatigue, and lowered appetite.  She reports no history of manic symptoms.  She reports no history of psychosis.  She reports no trauma history.  She reports that for the last few days she has had some nausea and headache.  She states that yesterday she started to have some diarrhea.  Discussed with her that it was most likely not due to the Lexapro but due to a seasonal stomach bug and encouraged her to make an appointment with the [facility] after leaving this appointment.  She states she will make an appointment with them.  Discussed that since she is doing well with her current dose of Lexapro and any changes in it can cause nausea and GI upset I would not recommend making any changes to the medicine at this point and see her back in about 1 to 2 months and at that time decide whether to make any medication changes.  She was agreeable with this plan.  She then asked if Lexapro can raise blood pressure.  Discussed with her that it is a possibility, however, her blood pressure measured today in the office is 127/71 which is indicative of good control.  She reports she is on Nifedipine which was started for her preeclampsia.  We then had a discussion about hypertensive urgency or emergency which is if her blood pressure ever gets above 180 systolically or  160 diastolically she does need to be evaluated in the emergency room immediately for that.  Discussed that we would also keep an eye on her blood pressure and if it began to increase we could choose an alternative medicine from Lexapro but that at this point in time her blood pressure was good so would not need to.  Discussed with her what to do in the event of a future crisis.  Discussed that she can return to [facility], go to [facility], go to the nearest ED, or call 911 or [phone].   She reported understanding  and had no concerns.  She reports no SI, HI, or AVH.  She reports no other concerns at present.   Associated Signs/Symptoms: Depression Symptoms:   Reports None (Hypo) Manic Symptoms:   Reports None Anxiety Symptoms:  Excessive Worry, Panic Symptoms, Psychotic Symptoms:   Reports None PTSD Symptoms: NA  Past Psychiatric History: GAD with no hospitalizations.   Previous Psychotropic Medications: Yes  Zoloft, Wellbutrin, Lexapro  Substance Abuse History in the last 12 months:  No.  Consequences of Substance Abuse: NA  Past Medical History:  Past Medical History:  Diagnosis Date   Abdominal pain 01/06/2012   Acute tension headache 07/27/2019   Anemia    Ankle pain    Asthma    Birth control counseling 08/26/2011   BMI 40.0-44.9, adult (HCC) 02/27/2011   GERD (gastroesophageal reflux disease)    Hiatal hernia    Obesity (BMI 30-39.9)    Secondary amenorrhea 03/25/2018    Past Surgical History:  Procedure Laterality Date   CESAREAN SECTION  09/02/2010   For macrosomia, but son was 7 lb 12 oz   CESAREAN SECTION N/A 10/06/2021   Procedure: CESAREAN SECTION;  Surgeon: La Grulla Bing, MD;  Location: [facility];  Service: Obstetrics;  Laterality: N/A;   TONSILLECTOMY AND ADENOIDECTOMY      Family Psychiatric History: No known Diagnosis', Substance Abuse, or Suicides.  Family History:  Family History  Problem Relation Age of Onset   Diabetes Mother    Hypertension Mother    Obesity Mother    Heart attack Mother 14       Death   Obesity Sister    Osteochondroma Sister    Diabetes Sister    Hypertension Sister     Social History:   Social History   Socioeconomic History   Marital status: Single    Spouse name: Not on file   Number of children: Not on file   Years of education: Not on file   Highest education level: Not on file  Occupational History   Occupation: Designer, fashion/clothing: UNEMPLOYED    Comment: sales  Tobacco Use   Smoking status: Never   Smokeless  tobacco: Never  Vaping Use   Vaping Use: Never used  Substance and Sexual Activity   Alcohol use: No   Drug use: Not Currently    Comment: last used in teens   Sexual activity: Not Currently    Birth control/protection: None  Other Topics Concern   Not on file  Social History Narrative   Lives son ([name] 09/02/2010 ), sister Martie Lee Grismore 10/28/94), and mom (Mary Longie-Gillis) and mom's fiance.    Attending [employer] to obtain GED.   Mom's fiance smokes outside house.    Pet: dog               Social Determinants of Health   Financial Resource Strain: Low Risk    Difficulty  of Paying Living Expenses: Not hard at all  Food Insecurity: No Food Insecurity   Worried About Running Out of Food in the Last Year: Never true   Ran Out of Food in the Last Year: Never true  Transportation Needs: No Transportation Needs   Lack of Transportation (Medical): No   Lack of Transportation (Non-Medical): No  Physical Activity: Sufficiently Active   Days of Exercise per Week: 3 days   Minutes of Exercise per Session: 60 min  Stress: Stress Concern Present   Feeling of Stress : Very much  Social Connections: Socially Integrated   Frequency of Communication with Friends and Family: Three times a week   Frequency of Social Gatherings with Friends and Family: Twice a week   Attends Religious Services: More than 4 times per year   Active Member of [employer] or Organizations: Yes   Attends Engineer, structural: More than 4 times per year   Marital Status: Living with partner    Additional Social History: Lives with 4 month old, 46 yr old, and boyfriend.  Works in [employer].  Feels safe where she lives.  Allergies:   Allergies  Allergen Reactions   Morphine And Related Other (See Comments)    Causes "chest to be heavy"   Motrin [Ibuprofen]     Was told to not take this because it would flare stomach issues    Metabolic Disorder Labs: Lab Results  Component Value Date    HGBA1C 5.4 03/20/2021   Lab Results  Component Value Date   PROLACTIN 36.4 (H) 04/16/2018   Lab Results  Component Value Date   CHOL 131 08/19/2019   TRIG 69 08/19/2019   HDL 37 (L) 08/19/2019   CHOLHDL 3.5 08/19/2019   LDLCALC 80 08/19/2019   Lab Results  Component Value Date   TSH 3.870 03/20/2021    Therapeutic Level Labs: No results found for: LITHIUM No results found for: CBMZ No results found for: VALPROATE  Current Medications: Current Outpatient Medications  Medication Sig Dispense Refill   acetaminophen (TYLENOL) 500 MG tablet Take 2 tablets (1,000 mg total) by mouth every 6 (six) hours as needed for moderate pain. 60 tablet 0   albuterol (VENTOLIN HFA) 108 (90 Base) MCG/ACT inhaler INHALE 2 PUFFS BY MOUTH EVERY 4 HOURS AS NEEDED FOR WHEEZING OR SHORTNESS OF BREATH (COUGH). 18 g 0   cetirizine (ZYRTEC) 5 MG tablet Take 5 mg by mouth daily.     docusate sodium (COLACE) 100 MG capsule Take 1 capsule (100 mg total) by mouth 2 (two) times daily. 60 capsule 1   escitalopram (LEXAPRO) 5 MG tablet Take 1 tablet (5 mg total) by mouth daily. 30 tablet 0   ferrous sulfate 325 (65 FE) MG tablet Take 1 tablet (325 mg total) by mouth every other day. 30 tablet 1   fluticasone (FLONASE) 50 MCG/ACT nasal spray Place 1 spray into both nostrils daily as needed for allergies or rhinitis. 1 spray in each nostril every day 16 g 12   fluticasone (FLOVENT HFA) 110 MCG/ACT inhaler Inhale 2 puffs into the lungs 2 (two) times daily. 1 each 12   hydrOXYzine (ATARAX) 25 MG tablet Take 1 tablet (25 mg total) by mouth 3 (three) times daily as needed for anxiety. 90 tablet 0   NIFEdipine (PROCARDIA-XL/NIFEDICAL-XL) 30 MG 24 hr tablet Take 1 tablet (30 mg total) by mouth 2 (two) times daily. 60 tablet 0   norgestimate-ethinyl estradiol (SPRINTEC 28) 0.25-35 MG-MCG tablet Take 1  tablet by mouth daily. 28 tablet 11   pantoprazole (PROTONIX) 40 MG tablet Take 1 tablet (40 mg total) by mouth daily. 20  tablet 1   polyethylene glycol (MIRALAX / GLYCOLAX) 17 g packet Take 17 g by mouth daily. 14 each 1   Prenatal Vit-Fe Fumarate-FA (PRENATAL COMPLETE) 14-0.4 MG TABS Take 1 tablet by mouth daily. 60 tablet 3   No current facility-administered medications for this visit.    Musculoskeletal: Strength & Muscle Tone: within normal limits Gait & Station: normal Patient leans: N/A  Psychiatric Specialty Exam: Review of Systems  Respiratory:  Negative for cough and shortness of breath.   Gastrointestinal:  Positive for diarrhea (started yesterday) and nausea (mild). Negative for abdominal pain, constipation and vomiting.  Neurological:  Positive for headaches (mild). Negative for dizziness and weakness.  Psychiatric/Behavioral:  Negative for hallucinations and suicidal ideas. The patient is not nervous/anxious.    Blood pressure 127/71, pulse 70, height 5\' 2"  (1.575 m), weight 243 lb (110.2 kg), not currently breastfeeding.Body mass index is 44.45 kg/m.  General Appearance: Casual and Fairly Groomed  Eye Contact:  Good  Speech:  Clear and Coherent and Normal Rate  Volume:  Normal  Mood:  Euthymic  Affect:  Appropriate and Congruent  Thought Process:  Coherent and Goal Directed  Orientation:  Full (Time, Place, and Person)  Thought Content:  Logical  Suicidal Thoughts:  No  Homicidal Thoughts:  No  Memory:  Immediate;   Good Recent;   Good  Judgement:  Good  Insight:  Good  Psychomotor Activity:  Normal  Concentration:  Concentration: Good and Attention Span: Good  Recall:  Good  Fund of Knowledge:Good  Language: Good  Akathisia:  Negative  Handed:  Right  AIMS (if indicated):  not done  Assets:  ArchitectCommunication Skills Financial Resources/Insurance Housing Resilience Social Support Vocational/Educational  ADL's:  Intact  Cognition: WNL  Sleep:   poor due to 443 month old baby   Screenings: GAD-7    Advertising copywriterlowsheet Row Counselor [facility] from [facility]20[facility]2021 [facility] Clinical Support from 10[facility]22[facility]2021 [facility] Routine Prenatal from 09/23/2021 [facility]  Total GAD-7 Score 15 4 9 4 8       PHQ2-9    Flowsheet Row Counselor [facility] from [facility]/20/2021 [facility]  PHQ-2 Total Score 1 0 0 0 0  PHQ-9 Total Score -- 3 3 4 7       Flowsheet Row Counselor [facility] from [facility]  C-SSRS RISK CATEGORY No Risk No Risk No Risk       Assessment and Plan:  Veronica BradfordKimberly has responded well to starting the Lexapro in January.  Her current nausea/diarrhea is most likely not due to her Lexapro as she started this January 11 and her symptoms started with in the last week.  She will go  to the [facility] for [facility].  I will keep her Lexapro at her current dose and see her back in 1-2 months.  At that time we can discuss further dose increases.  I will send in refills for her Lexapro and Hydroxyzine.   GAD: -Continue Lexapro 5 mg daily -Continue Hydroxyzine 25 mg PRN TID   Collaboration of Care: Case discussed with Supervising Physician Dr. Lucianne Muss.  Patient/Guardian was advised Release of Information must be obtained prior to any record release in order to collaborate their care with an  outside provider. Patient/Guardian was advised if they have not already done so to contact the registration department to sign all necessary forms in order for [place] to release information regarding their care.   Consent: Patient/Guardian gives verbal consent for treatment and assignment of benefits for services provided during this visit. Patient/Guardian expressed understanding and agreed to proceed.   Lauro Franklin, MD 2/23/20231:21 PM

## 2022-02-04 ENCOUNTER — Ambulatory Visit (HOSPITAL_COMMUNITY)
Admission: EM | Admit: 2022-02-04 | Discharge: 2022-02-04 | Disposition: A | Discharge status: Home / Self Care | Payer: Medicaid Other | Attending: Emergency Medicine | Admitting: Emergency Medicine

## 2022-02-04 ENCOUNTER — Encounter (HOSPITAL_COMMUNITY): Payer: Self-pay

## 2022-02-04 ENCOUNTER — Other Ambulatory Visit: Payer: Self-pay

## 2022-02-04 DIAGNOSIS — J4521 Mild intermittent asthma with (acute) exacerbation: Secondary | ICD-10-CM

## 2022-02-04 DIAGNOSIS — J069 Acute upper respiratory infection, unspecified: Secondary | ICD-10-CM | POA: Diagnosis not present

## 2022-02-04 MED ORDER — PREDNISONE 20 MG PO TABS: 40.0000 mg | ORAL_TABLET | Freq: Every day | ORAL | 0 refills | Status: DC

## 2022-02-04 MED ORDER — PROMETHAZINE-DM 6.25-15 MG/5ML PO SYRP: 5.0000 mL | ORAL_SOLUTION | Freq: Four times a day (QID) | ORAL | 0 refills | Status: DC | PRN

## 2022-02-04 MED ORDER — BENZONATATE 100 MG PO CAPS: 100.0000 mg | ORAL_CAPSULE | Freq: Three times a day (TID) | ORAL | 0 refills | Status: DC

## 2022-02-04 NOTE — ED Triage Notes (Signed)
Pt c/o cough, nasal congestion, chest soreness, and wheezing x2 days. States having to use her inhaler more often

## 2022-02-04 NOTE — ED Provider Notes (Signed)
[place]    CSN: [id number] Arrival date & time: 02/04/22  1013      History   Chief Complaint Chief Complaint  Patient presents with   Cough    HPI Veronica [name] is a 31 y.o. female.   Patient presents with chills, nasal congestion, rhinorrhea, productive cough, intermittent shortness of breath and wheezing, chest tightness and generalized headaches for 2 days.  Has been using albuterol inhaler at least 4 times a day and Mucinex which have been minimally helpful.  No known sick contacts.  Tolerating food and liquids.  History of asthma.  Past Medical History:  Diagnosis Date   Abdominal pain 01/06/2012   Acute tension headache 07/27/2019   Anemia    Ankle pain    Asthma    Birth control counseling 08/26/2011   BMI 40.0-44.9, adult ([place]) 02/27/2011   GERD (gastroesophageal reflux disease)    Hiatal hernia    Obesity (BMI 30-39.9)    Secondary amenorrhea 03/25/2018    Patient Active Problem List   Diagnosis Date Noted   Asthma 08/31/2020   Back pain 02/13/2020   Hiatal hernia 11/02/2019   Epigastric pain 09/13/2019   Subclinical hypothyroidism 05/19/2019   GAD (generalized anxiety disorder) 05/09/2019   BMI 50.0-59.9, adult ([place]) 02/27/2011   Anemia 02/27/2011    Past Surgical History:  Procedure Laterality Date   CESAREAN SECTION  09/02/2010   For macrosomia, but son was 7 lb 12 oz   CESAREAN SECTION N/A 10/06/2021   Procedure: CESAREAN SECTION;  Surgeon: Aletha Halim, MD;  Location: [facility];  Service: Obstetrics;  Laterality: N/A;   TONSILLECTOMY AND ADENOIDECTOMY      OB History     Gravida  2   Para  2   Term  2   Preterm      AB      Living  2      SAB      IAB      Ectopic      Multiple  0   Live Births  2            Home Medications    Prior to Admission medications   Medication Sig Start Date End Date Taking? Authorizing Provider  acetaminophen (TYLENOL) 500 MG tablet Take 2 tablets (1,000 mg total)  by mouth every 6 (six) hours as needed for moderate pain. 10/09/21   Roma Schanz, CNM  albuterol (VENTOLIN HFA) 108 (90 Base) MCG/ACT inhaler INHALE 2 PUFFS BY MOUTH EVERY 4 HOURS AS NEEDED FOR WHEEZING OR SHORTNESS OF BREATH (COUGH). 10/15/20   Patriciaann Clan, DO  cetirizine (ZYRTEC) 5 MG tablet Take 5 mg by mouth daily.    [provider]  docusate sodium (COLACE) 100 MG capsule Take 1 capsule (100 mg total) by mouth 2 (two) times daily. 06/01/21   Gavin Pound, CNM  escitalopram (LEXAPRO) 5 MG tablet Take 1 tablet (5 mg total) by mouth daily. 01/30/22   Briant Cedar, MD  ferrous sulfate 325 (65 FE) MG tablet Take 1 tablet (325 mg total) by mouth every other day. 12/19/21   Shary Key, DO  fluticasone (FLONASE) 50 MCG/ACT nasal spray Place 1 spray into both nostrils daily as needed for allergies or rhinitis. 1 spray in each nostril every day 12/20/21   Zola Button, MD  fluticasone (FLOVENT HFA) 110 MCG/ACT inhaler Inhale 2 puffs into the lungs 2 (two) times daily. 12/20/21   Zola Button, MD  hydrOXYzine (ATARAX) 25 MG tablet Take 1 tablet (25 mg total) by mouth 3 (three) times daily as needed for anxiety. 01/30/22   Lauro Franklin, MD  NIFEdipine (PROCARDIA-XL/NIFEDICAL-XL) 30 MG 24 hr tablet Take 1 tablet (30 mg total) by mouth 2 (two) times daily. 01/28/22   Cora Collum, DO  norgestimate-ethinyl estradiol (SPRINTEC 28) 0.25-35 MG-MCG tablet Take 1 tablet by mouth daily. 12/19/21   Cora Collum, DO  pantoprazole (PROTONIX) 40 MG tablet Take 1 tablet (40 mg total) by mouth daily. 10/22/21   Adam Phenix, MD  polyethylene glycol (MIRALAX / GLYCOLAX) 17 g packet Take 17 g by mouth daily. 06/01/21   Gerrit Heck, CNM  Prenatal Vit-Fe Fumarate-FA (PRENATAL COMPLETE) 14-0.4 MG TABS Take 1 tablet by mouth daily. 02/03/21   Linwood Dibbles, MD    Family History Family History  Problem Relation Age of Onset   Diabetes Mother    Hypertension Mother    Obesity  Mother    Heart attack Mother 84       Death   Obesity Sister    Osteochondroma Sister    Diabetes Sister    Hypertension Sister     Social History Social History   Tobacco Use   Smoking status: Never   Smokeless tobacco: Never  Vaping Use   Vaping Use: Never used  Substance Use Topics   Alcohol use: No   Drug use: Not Currently    Comment: last used in teens     Allergies   Morphine and related and Motrin [ibuprofen]   Review of Systems Review of Systems  Constitutional:  Positive for chills. Negative for activity change, appetite change, diaphoresis, fatigue, fever and unexpected weight change.  HENT:  Positive for congestion and rhinorrhea. Negative for dental problem, drooling, ear discharge, ear pain, facial swelling, hearing loss, mouth sores, nosebleeds, postnasal drip, sinus pressure, sinus pain, sneezing, sore throat, tinnitus, trouble swallowing and voice change.   Respiratory:  Positive for cough, chest tightness, shortness of breath and wheezing. Negative for apnea, choking and stridor.   Cardiovascular: Negative.   Gastrointestinal: Negative.   Skin: Negative.   Neurological:  Positive for headaches. Negative for dizziness, tremors, seizures, syncope, facial asymmetry, speech difficulty, weakness, light-headedness and numbness.    Physical Exam Triage Vital Signs ED Triage Vitals [02/04/22 1100]  Enc Vitals Group     BP 112/79     Pulse Rate 82     Resp 18     Temp 99.5 F (37.5 C)     Temp Source Oral     SpO2 99 %     Weight      Height      Head Circumference      Peak Flow      Pain Score 0     Pain Loc      Pain Edu?      Excl. in GC?    No data found.  Updated Vital Signs BP 112/79 (BP Location: Right Arm)    Pulse 82    Temp 99.5 F (37.5 C) (Oral)    Resp 18    LMP 01/24/2022    SpO2 99%    Breastfeeding No   Visual Acuity Right Eye Distance:   Left Eye Distance:   Bilateral Distance:    Right Eye Near:   Left Eye Near:     Bilateral Near:     Physical Exam Constitutional:      Appearance: Normal appearance.  HENT:  Head: Normocephalic.     Right Ear: Tympanic membrane, ear canal and external ear normal.     Left Ear: Tympanic membrane, ear canal and external ear normal.     Nose: Congestion and rhinorrhea present.     Mouth/Throat:     Mouth: Mucous membranes are moist.     Pharynx: Posterior oropharyngeal erythema present.  Eyes:     Extraocular Movements: Extraocular movements intact.  Cardiovascular:     Rate and Rhythm: Normal rate and regular rhythm.     Pulses: Normal pulses.     Heart sounds: Normal heart sounds.  Pulmonary:     Effort: Pulmonary effort is normal.     Breath sounds: Wheezing present.  Musculoskeletal:     Cervical back: Normal range of motion and neck supple.  Skin:    General: Skin is warm and dry.  Neurological:     Mental Status: She is alert and oriented to person, place, and time. Mental status is at baseline.  Psychiatric:        Mood and Affect: Mood normal.        Behavior: Behavior normal.     UC Treatments / Results  Labs (all labs ordered are listed, but only abnormal results are displayed) Labs Reviewed - No data to display  EKG   Radiology No results found.  Procedures Procedures (including critical care time)  Medications Ordered in UC Medications - No data to display  Initial Impression / Assessment and Plan / UC Course  I have reviewed the triage vital signs and the nursing notes.  Pertinent labs & imaging results that were available during my care of the patient were reviewed by me and considered in my medical decision making (see chart for details).  Arrived with cough Mild intermittent asthma with exacerbation  Vital signs are stable, 99% on room air, wheezing heard to auscultation, etiology of symptoms is most likely viral with asthma exacerbation, discussed with patient, prednisone 580 mg burst prescribed to reduce inflammation  and Tessalon and Promethazine DM prescribed for management of coughing as it is most worrisome symptom, recommended continued use of over-the-counter medications and inhalers as needed, may follow-up with urgent care for persisting symptoms work note given Final Clinical Impressions(s) / UC Diagnoses   Final diagnoses:  None   Discharge Instructions   None    ED Prescriptions   None    PDMP not reviewed this encounter.   Hans Eden, [place] 02/07/22 [id number]

## 2022-02-04 NOTE — Discharge Instructions (Signed)
Your symptoms today are most likely being caused by a virus and should steadily improve in time it can take up to 7 to 10 days before you truly start to see a turnaround however things will get better  Your virus has flared up your asthma  Begin use of prednisone every morning with food for the next 5 days  You may use Tessalon pill every 8 hours to help calm your coughing  You may use cough syrup every 6 hours as needed for additional comfort, be mindful this medication may make you drowsy, if this occurs use at bedtime  Medicaid does not typically pay for cough medicine, you may attempt to find a coupon on good Rx, if unable to afford cough medicine you may use over-the-counter Delsym    You can take Tylenol and/or Ibuprofen as needed for fever reduction and pain relief.   For cough: honey 1/2 to 1 teaspoon (you can dilute the honey in water or another fluid).  You can also use guaifenesin  for cough. You can use a humidifier for chest congestion and cough.  If you don't have a humidifier, you can sit in the bathroom with the hot shower running.      For sore throat: try warm salt water gargles, cepacol lozenges, throat spray, warm tea or water with lemon/honey, popsicles or ice, or OTC cold relief medicine for throat discomfort.   For congestion: take a daily anti-histamine like Zyrtec, Claritin, and a oral decongestant, such as pseudoephedrine.  You can also use Flonase 1-2 sprays in each nostril daily.   It is important to stay hydrated: drink plenty of fluids (water, gatorade/powerade/pedialyte, juices, or teas) to keep your throat moisturized and help further relieve irritation/discomfort.

## 2022-02-05 ENCOUNTER — Encounter: Payer: Self-pay | Admitting: Family Medicine

## 2022-02-05 ENCOUNTER — Ambulatory Visit (INDEPENDENT_AMBULATORY_CARE_PROVIDER_SITE_OTHER): Payer: Medicaid Other | Admitting: Family Medicine

## 2022-02-05 VITALS — BP 126/86 | HR 75 | Ht 62.0 in | Wt 240.0 lb

## 2022-02-05 DIAGNOSIS — F411 Generalized anxiety disorder: Secondary | ICD-10-CM

## 2022-02-05 DIAGNOSIS — R059 Cough, unspecified: Secondary | ICD-10-CM | POA: Diagnosis not present

## 2022-02-05 DIAGNOSIS — Z419 Encounter for procedure for purposes other than remedying health state, unspecified: Secondary | ICD-10-CM | POA: Diagnosis not present

## 2022-02-05 MED ORDER — ESCITALOPRAM OXALATE 10 MG PO TABS: 10.0000 mg | ORAL_TABLET | Freq: Every day | ORAL | 3 refills | Status: DC

## 2022-02-05 NOTE — Patient Instructions (Addendum)
It was great seeing you today! ? ?Today we increased your Lexapro to 10mg  a day. ? ?We are also testing you for COVID and flu I will call you if anything is abnormal. Continue to take the medications for your cough ? ?Please check-out at the front desk before leaving the clinic. I'd like to see you back in about 4 weeks for your physical but if you need to be seen earlier than that for any new issues we're happy to fit you in, just give a call! ? ?Visit Reminders: ?- Stop by the pharmacy to pick up your prescriptions  ?- Continue to work on your healthy eating habits and incorporating exercise into your daily life.  ? ?Feel free to call with any questions or concerns at any time, at [phone]. ?  ?Take care,  ?Dr. [phone] ?[facility]  ?

## 2022-02-05 NOTE — Progress Notes (Signed)
? ? ?  SUBJECTIVE:  ? ?CHIEF COMPLAINT / HPI:  ? ?Was seen yesterday at urgent care yesterday for cough. Presents today because job needs her to get COVID tested and have a letter written stating this was done.  ? ?She reports on Monday started having chills, headaches, productive cough, elevated temp. Coughing so bad her chest started hurting. At [facility] yesterday was started on prednisone 40mg  for 5 days. Tessalon perles 100mg  daily, promethazine DM syrup. Has been using green tea with lemon. States currently fever is gone, but cough still linering  ? ?States anxiety is much better controlled on Lexapro, interested in increasing dose  ?Didn't have to take hydrozyzine for 2 weeks, did take once yesterday  ? ? ?OBJECTIVE:  ? ?BP 126/86   Pulse 75   Ht 5\' 2"  (1.575 m)   Wt 240 lb (108.9 kg)   LMP 01/24/2022 (Exact Date)   SpO2 100%   BMI 43.90 kg/m?   ? ?Physical exam ? ?General: well appearing, NAD ?Cardiovascular: RRR, no murmurs ?Lungs: CTAB. Normal WOB ?Abdomen: soft, non-distended, non-tender ?Skin: warm, dry. No edema ?Psych: mood and affect appropriate. Speech non pressured and non tangential. Thought content normal ? ?ASSESSMENT/PLAN:  ? ?No problem-specific Assessment & Plan notes found for this encounter. ?  ?Cough, body aches ?Occurring for past 2 days. Was seen at urgent care yesterday and was started on prednisone 40mg  for 5 days. Tessalon perles 100mg  daily, promethazine DM syrup. Per request tested for COVID and flu today. Also provided work . Discussed continued symptomatic management and return precautions ? ?Anxiety ?Patient currently on Lexapro 5mg . Tolerating it well and feels improvement. Also has Hydroxyzine as needed but has only needed it once in the past 2 weeks.  ?- Increase to 10mg  daily.  ? ?Follow up in 4 weeks for physical  ? ? ? , DO ?[facility]   ?

## 2022-02-07 ENCOUNTER — Encounter: Payer: Self-pay | Admitting: Family Medicine

## 2022-02-07 ENCOUNTER — Telehealth: Payer: Self-pay

## 2022-02-07 LAB — COVID-19, FLU A+B NAA
Influenza A, NAA: NOT DETECTED
Influenza B, NAA: NOT DETECTED
SARS-CoV-2, NAA: DETECTED — AB

## 2022-02-07 NOTE — Telephone Encounter (Signed)
Patient calls nurse line regarding COVID results. Advised of positive results. Patient advised of CDC recommendations regarding quarantine. Patient reports symptom onset on Monday, 2/27. States that symptoms have improved, with only current symptom being a slight cough.  ? ?Please advise if work note can be sent through mychart to excuse absence for COVID and return to work.  ? ?Thanks.  ? ?Veronda Prude, RN ? ?

## 2022-02-24 ENCOUNTER — Encounter: Payer: Self-pay | Admitting: Certified Nurse Midwife

## 2022-02-25 ENCOUNTER — Ambulatory Visit (INDEPENDENT_AMBULATORY_CARE_PROVIDER_SITE_OTHER): Payer: Medicaid Other | Admitting: Family Medicine

## 2022-02-25 ENCOUNTER — Other Ambulatory Visit: Payer: Self-pay

## 2022-02-25 ENCOUNTER — Encounter: Payer: Self-pay | Admitting: Family Medicine

## 2022-02-25 ENCOUNTER — Other Ambulatory Visit: Payer: Self-pay | Admitting: Family Medicine

## 2022-02-25 VITALS — BP 124/76 | HR 80 | Wt 245.0 lb

## 2022-02-25 DIAGNOSIS — D649 Anemia, unspecified: Secondary | ICD-10-CM

## 2022-02-25 DIAGNOSIS — Z6841 Body Mass Index (BMI) 40.0 and over, adult: Secondary | ICD-10-CM

## 2022-02-25 DIAGNOSIS — I10 Essential (primary) hypertension: Secondary | ICD-10-CM | POA: Diagnosis not present

## 2022-02-25 DIAGNOSIS — O165 Unspecified maternal hypertension, complicating the puerperium: Secondary | ICD-10-CM

## 2022-02-25 DIAGNOSIS — E66813 Obesity, class 3: Secondary | ICD-10-CM

## 2022-02-25 DIAGNOSIS — M25571 Pain in right ankle and joints of right foot: Secondary | ICD-10-CM | POA: Diagnosis present

## 2022-02-25 MED ORDER — OZEMPIC (0.25 OR 0.5 MG/DOSE) 2 MG/1.5ML ~~LOC~~ SOPN: 0.2500 mg | PEN_INJECTOR | SUBCUTANEOUS | 0 refills | Status: DC

## 2022-02-25 MED ORDER — NIFEDIPINE ER OSMOTIC RELEASE 30 MG PO TB24: 30.0000 mg | ORAL_TABLET | Freq: Two times a day (BID) | ORAL | 0 refills | Status: DC

## 2022-02-25 NOTE — Patient Instructions (Addendum)
It was great seeing you today! ? ?Your ankle pain is likely due to your sprain. It is reassuring that you have been able to put weight on it. Continue to ice it and use over the counter pain relief like Tylenol or Advil. If it does not improve after a week let me know and we will get an X ray.  ? ?We are  checking your blood work and I will call you if anything is abnormal.  ? ?We started you on once weekly Ozempic to help with weight loss. You may have some nausea that should subside but let me know if it does not. We will increase the dose next month if you are able to tolerate the medicine.  ? ?Please check-out at the front desk before leaving the clinic. I'd like to see you back 2 weeks after you start taking the Ozempic but if you need to be seen earlier than that for any new issues we're happy to fit you in, just give [place] a call! ? ?Visit Reminders: ?- Stop by the pharmacy to pick up your prescriptions  ?- Continue to work on your healthy eating habits and incorporating exercise into your daily life.  ? ?Feel free to call with any questions or concerns at any time, at [phone]. ?  ?Take care,  ?Dr. Cora Collum ?[facility]  ?

## 2022-02-25 NOTE — Progress Notes (Signed)
Received message from patient that she is out of her BP medication. Will refill  ?

## 2022-02-25 NOTE — Progress Notes (Signed)
? ? ?  SUBJECTIVE:  ? ?CHIEF COMPLAINT / HPI:  ? ?Ms. Veronica [name] is a 31 yo who presents for her ankle pain and to get blood work done.  ? ?She states he fell and twisted right ankle yesterday when coming down the stairs at [place]. Heard a pop. Didn't go to hospital because oldest son's game was at the same time.  Ankle hurts on inside and outside of ankle. Has done this before and torn a ligament. Was able to put weight on it. Outside of ankle was swollen. Denies seeing any bruising. Put ice on it and propped her leg up which has helped.  ? ?LMP 3/15. Menstrual cycle lasts 5-7 days. Starts heavy and lightens up  ?Currently on oral contreptives. Still on every other day iron.  ? ? ?OBJECTIVE:  ? ?BP 124/76   Pulse 80   Wt 245 lb (111.1 kg)   LMP 02/19/2022 (Approximate)   Breastfeeding No   BMI 44.81 kg/m?   ? ?General: alert, NAD ?CV: RRR no murmurs ?Resp: CTAB normal WOB ? ?R Foot: ?Inspection:  No obvious bony deformity, Mild edema around lateral malleolus. No erythema, or bruising.  ?Palpation: Mild tenderness to palpation under lateral malleolus. No tenderness along tibia or dorsum of foot. No bony tenderness  ?ROM: Full  ROM of the ankle. Normal midfoot flexibility ?Strength: 5/5 strength ankle in all planes ?Neurovascular: N/V intact distally in the lower extremity ?Special tests: Negative anterior drawer. Negative squeeze. normal midfoot flexibility.  ? ?ASSESSMENT/PLAN:  ? ?No problem-specific Assessment & Plan notes found for this encounter. ? ?R ankle pain ?Likely due to ankle sprain. Physical exam with mild tenderness under lateral malleolus. No tibial pain. Good strength and ROM. Given she was able to bear weight immediately and physical exam likely not a fracture but advised her to reach out if not improving after a week and we can obtain imaging. Recommend continued ice, elevation, OTC pain relief.  ? ?Anemia  ?Patient wanting to rechfeck iron levels. States she has been taking every other day iron.  Last hgb 11.1 last November. Denies having symptoms ?-CBC, ferritin  ? ?HTN ?BP 124/76 taking procardia 30mg  BID. Patient has been wanting to continue on this ?- refilled procardia  ? ?Obesity, hx pre diabetes  ?BMI 44. A1c 5.4. Patient states despite trying to eat healthier and work out she has not been able to lose weight. Discussed Ozempic in addition to a good diet and exercise which she was agreeable to.  ?- Ozempic .25mg  weekly ?- follow up 2 weeks after starting medication to ensure tolerating well  ? ? ?Shary Key, DO ?[place]   ?

## 2022-02-26 ENCOUNTER — Telehealth: Payer: Self-pay

## 2022-02-26 LAB — CBC
Hematocrit: 36.7 % (ref 34.0–46.6)
Hemoglobin: 11.9 g/dL (ref 11.1–15.9)
MCH: 26.1 pg — ABNORMAL LOW (ref 26.6–33.0)
MCHC: 32.4 g/dL (ref 31.5–35.7)
MCV: 81 fL (ref 79–97)
Platelets: 407 10*3/uL (ref 150–450)
RBC: 4.56 x10E6/uL (ref 3.77–5.28)
RDW: 14.1 % (ref 11.7–15.4)
WBC: 7.5 10*3/uL (ref 3.4–10.8)

## 2022-02-26 LAB — HEMOGLOBIN A1C
Est. average glucose Bld gHb Est-mCnc: 108 mg/dL
Hgb A1c MFr Bld: 5.4 % (ref 4.8–5.6)

## 2022-02-26 LAB — FERRITIN: Ferritin: 14 ng/mL — ABNORMAL LOW (ref 15–150)

## 2022-02-26 LAB — TSH: TSH: 2.26 u[IU]/mL (ref 0.450–4.500)

## 2022-02-26 NOTE — Telephone Encounter (Signed)
A Prior Authorization was initiated for this patients OZEMPIC through CoverMyMeds.  ? ?Key: [id number] ? ?

## 2022-02-27 NOTE — Telephone Encounter (Signed)
Prior Auth for patients medication OZEMPIC denied by [facility] MEDICAID via CoverMyMeds.  ? ?Reason: This drug is not approved by the Food and Drug Administration (FDA) to treat Obesity. It is only FDA approved for the treatment of Type 2 Diabetes Mellitus in patients 30 years of age and older. Note: Weight loss medications are an excluded benefit. ? ?Patient should be notified from plan. ?Denial letter scanned to pt chart. ? ?CoverMyMeds Key: [id number] ? ?

## 2022-03-06 ENCOUNTER — Encounter (HOSPITAL_COMMUNITY): Payer: Medicaid Other | Admitting: Student in an Organized Health Care Education/Training Program

## 2022-03-07 ENCOUNTER — Encounter: Payer: Self-pay | Admitting: Emergency Medicine

## 2022-03-07 ENCOUNTER — Ambulatory Visit (INDEPENDENT_AMBULATORY_CARE_PROVIDER_SITE_OTHER): Payer: Medicaid Other | Admitting: Emergency Medicine

## 2022-03-07 DIAGNOSIS — K449 Diaphragmatic hernia without obstruction or gangrene: Secondary | ICD-10-CM | POA: Diagnosis not present

## 2022-03-07 DIAGNOSIS — J4520 Mild intermittent asthma, uncomplicated: Secondary | ICD-10-CM | POA: Diagnosis not present

## 2022-03-07 DIAGNOSIS — J301 Allergic rhinitis due to pollen: Secondary | ICD-10-CM | POA: Diagnosis not present

## 2022-03-07 DIAGNOSIS — R053 Chronic cough: Secondary | ICD-10-CM

## 2022-03-07 DIAGNOSIS — J309 Allergic rhinitis, unspecified: Secondary | ICD-10-CM | POA: Insufficient documentation

## 2022-03-07 NOTE — Patient Instructions (Addendum)
We will continue Flovent 44 mcg, 2 puffs twice a day.  Rinse and gargle after using.  We may decide to adjust this medication depending on your pulmonary function testing and future symptoms. ?Keep your albuterol available to use 2 puffs if needed for shortness of breath, chest tightness, wheezing. ?We will arrange for pulmonary function testing at your next office visit. ?Please continue your Protonix once daily as you have been taking them. ?Continue Zyrtec (cetirizine) once daily ?Start taking your fluticasone (Flonase) nasal spray, 2 sprays each nostril once daily every day during the allergy season. ?Follow with Dr. Delton Coombes next available with full pulmonary function testing on the same day.  ? ?

## 2022-03-07 NOTE — Assessment & Plan Note (Signed)
With GERD.  Some breakthrough on her current PPI but minimal.  Plan to continue same regimen ?

## 2022-03-07 NOTE — Assessment & Plan Note (Signed)
Working to address contribution of obstructive lung disease.  She is currently on Flovent, and if her obstructive lung disease is minimal then we could consider stopping this as a potential upper airway irritant.  Work to treat GERD and rhinitis aggressively. ? ?

## 2022-03-07 NOTE — Assessment & Plan Note (Signed)
Now on cetirizine.  She has fluticasone nasal spray, use as needed.  We will increase this to every day on a schedule during the allergy season. ?

## 2022-03-07 NOTE — Progress Notes (Signed)
? ?Subjective:  ? ? Patient ID: Veronica [name], female    DOB: 11/22/1991, 31 y.o.   MRN: [id number] ? ?HPI ? ?ROV 04/06/20 --this is a follow-up visit for dyspnea and upper airway noise.  She had COVID-19 in June 2020 as above.  She has breakthrough GERD so increase her pantoprazole to 40 mg twice a day last visit, also added loratadine and fluticasone nasal spray given her upper airway symptoms. She is still having some breakthrough GERD. The allergy regimen did help her some.  ?  ?Pulmonary function testing done today reviewed by me shows grossly normal airflows but with a borderline positive bronchodilator response and a significant improvement in her FEF 25-75%.  Her lung volumes are normal and her diffusion capacity is elevated.  Her chest x-ray from 03/17/2020 was reviewed by me, shows some mild central airway thickening, question acute inflammatory change or reactive airways.  No consolidation, effusion, infiltrates. ? ? ?ROV 03/07/22 --31 year old woman with a history of multifactorial shortness of breath in the setting of restrictive lung disease due to obesity and a hiatal hernia, upper airway irritation syndrome exacerbated by GERD and seasonal allergies, suspected asthma based on mid flow improvement on spirometry with bronchodilator.  She returns today for follow-up.  She reports that she had COVID-19 at the beginning of this month.  Describes fever, chills. She had a lot of cough, which has persisted. Completed a 5 days course pred. Received cough syrup, tessalon.  ? ?She has been on the flovent for several months. Was started when she was pregnant. Rare albuterol use ?Rare breakthrough GERD, on PPI. She remains on flonase prn, zyrtec.  ? ?She is on Protonix, Zyrtec, fluticasone nasal spray.  ? ?Review of Systems ?As per HPI ? ?Past Medical History:  ?Diagnosis Date  ? Abdominal pain 01/06/2012  ? Acute tension headache 07/27/2019  ? Anemia   ? Ankle pain   ? Asthma   ? Birth control counseling 08/26/2011   ? BMI 40.0-44.9, adult (HCC) 02/27/2011  ? GERD (gastroesophageal reflux disease)   ? Hiatal hernia   ? Obesity (BMI 30-39.9)   ? Secondary amenorrhea 03/25/2018  ?  ? ?Family History  ?Problem Relation Age of Onset  ? Diabetes Mother   ? Hypertension Mother   ? Obesity Mother   ? Heart attack Mother 10  ?     Death  ? Obesity Sister   ? Osteochondroma Sister   ? Diabetes Sister   ? Hypertension Sister   ?  ? ?Social History  ? ?Socioeconomic History  ? Marital status: Significant Other  ?  Spouse name: Not on file  ? Number of children: Not on file  ? Years of education: Not on file  ? Highest education level: Not on file  ?Occupational History  ? Occupation: [employer]  ?  Employer: UNEMPLOYED  ?  Comment: sales  ?Tobacco Use  ? Smoking status: Never  ? Smokeless tobacco: Never  ?Vaping Use  ? Vaping Use: Never used  ?Substance and Sexual Activity  ? Alcohol use: No  ? Drug use: Not Currently  ?  Comment: last used in teens  ? Sexual activity: Not Currently  ?  Birth control/protection: Pill  ?Other Topics Concern  ? Not on file  ?Social History Narrative  ? Lives son ([name] 09/02/2010 ), sister Martie Lee Hall 10/28/94), and mom (Mary Tacey-Gillis) and mom's fiance.   ? Attending [employer] to obtain GED.  ? Mom's fiance smokes outside house.   ?  Pet: dog  ?   ?   ?   ?   ? ?Social Determinants of Health  ? ?Financial Resource Strain: Low Risk   ? Difficulty of Paying Living Expenses: Not hard at all  ?Food Insecurity: No Food Insecurity  ? Worried About Programme researcher, broadcasting/film/videounning Out of Food in the Last Year: Never true  ? Ran Out of Food in the Last Year: Never true  ?Transportation Needs: No Transportation Needs  ? Lack of Transportation (Medical): No  ? Lack of Transportation (Non-Medical): No  ?Physical Activity: Sufficiently Active  ? Days of Exercise per Week: 3 days  ? Minutes of Exercise per Session: 60 min  ?Stress: Stress Concern Present  ? Feeling of Stress : Very much  ?Social Connections: Socially Integrated  ? Frequency  of Communication with Friends and Family: Three times a week  ? Frequency of Social Gatherings with Friends and Family: Twice a week  ? Attends Religious Services: More than 4 times per year  ? Active Member of Clubs or Organizations: Yes  ? Attends BankerClub or Organization Meetings: More than 4 times per year  ? Marital Status: Living with partner  ?Intimate Partner Violence: Not At Risk  ? Fear of Current or Ex-Partner: No  ? Emotionally Abused: No  ? Physically Abused: No  ? Sexually Abused: No  ? 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  ?No military ?Some exposure to cleaning solutions ?No other exposures.  ? ?Allergies  ?Allergen Reactions  ? Morphine And Related Other (See Comments)  ?  Causes "chest to be heavy"  ? Motrin [Ibuprofen]   ?  Was told to not take this because it would flare stomach issues  ?  ? ?Outpatient Medications Prior to Visit  ?Medication Sig Dispense Refill  ? acetaminophen (TYLENOL) 500 MG tablet Take 2 tablets (1,000 mg total) by mouth every 6 (six) hours as needed for moderate pain. 60 tablet 0  ? albuterol (VENTOLIN HFA) 108 (90 Base) MCG/ACT inhaler INHALE 2 PUFFS BY MOUTH EVERY 4 HOURS AS NEEDED FOR WHEEZING OR SHORTNESS OF BREATH (COUGH). 18 g 0  ? cetirizine (ZYRTEC) 5 MG tablet Take 5 mg by mouth daily.    ? docusate sodium (COLACE) 100 MG capsule Take 1 capsule (100 mg total) by mouth 2 (two) times daily. 60 capsule 1  ? escitalopram (LEXAPRO) 10 MG tablet Take 1 tablet (10 mg total) by mouth daily. 30 tablet 3  ? ferrous sulfate 325 (65 FE) MG tablet Take 1 tablet (325 mg total) by mouth every other day. 30 tablet 1  ? fluticasone (FLONASE) 50 MCG/ACT nasal spray Place 1 spray into both nostrils daily as needed for allergies or rhinitis. 1 spray in each nostril every day 16 g 12  ? fluticasone (FLOVENT HFA) 110 MCG/ACT inhaler Inhale 2 puffs into the lungs 2 (two) times daily. 1 each 12  ? hydrOXYzine (ATARAX) 25 MG tablet  Take 1 tablet (25 mg total) by mouth 3 (three) times daily as needed for anxiety. 90 tablet 1  ? NIFEdipine (PROCARDIA-XL/NIFEDICAL-XL) 30 MG 24 hr tablet Take 1 tablet (30 mg total) by mouth 2 (two) times daily. 60 tablet 0  ? norgestimate-ethinyl estradiol (SPRINTEC 28) 0.25-35 MG-MCG tablet Take 1 tablet by mouth daily. 28 tablet 11  ? pantoprazole (PROTONIX) 40 MG tablet Take 1 tablet (40 mg total) by mouth daily. 20 tablet 1  ? polyethylene glycol (MIRALAX / GLYCOLAX) 17  g packet Take 17 g by mouth daily. 14 each 1  ? predniSONE (DELTASONE) 20 MG tablet Take 2 tablets (40 mg total) by mouth daily. 10 tablet 0  ? Prenatal Vit-Fe Fumarate-FA (PRENATAL COMPLETE) 14-0.4 MG TABS Take 1 tablet by mouth daily. 60 tablet 3  ? Semaglutide,0.25 or 0.5MG /DOS, (OZEMPIC, 0.25 OR 0.5 MG/DOSE,) 2 MG/1.5ML SOPN Inject 0.25 mg into the skin once a week. 1.5 mL 0  ? benzonatate (TESSALON) 100 MG capsule Take 1 capsule (100 mg total) by mouth every 8 (eight) hours. (Patient not taking: Reported on 03/07/2022) 21 capsule 0  ? promethazine-dextromethorphan (PROMETHAZINE-DM) 6.25-15 MG/5ML syrup Take 5 mLs by mouth 4 (four) times daily as needed for cough. (Patient not taking: Reported on 03/07/2022) 118 mL 0  ? ?No facility-administered medications prior to visit.  ? ? ? ?   ?Objective:  ? Physical Exam ?Vitals:  ? 03/07/22 1207  ?BP: 126/78  ?Pulse: 70  ?Temp: 98.7 ?F (37.1 ?C)  ?TempSrc: Oral  ?SpO2: 99%  ?Weight: 246 lb 3.2 oz (111.7 kg)  ?Height: 5\' 2"  (1.575 m)  ? ?Gen: Pleasant overweight woman, in no distress,  normal affect ? ?ENT: No lesions,  mouth clear, tongue piercings, oropharynx clear, no postnasal drip ? ?Neck: No JVD, no stridor ? ?Lungs: No use of accessory muscles, no crackles or wheezing on normal respiration, no wheeze on forced expiration ? ?Cardiovascular: RRR, heart sounds normal, no murmur or gallops, no peripheral edema ? ?Musculoskeletal: No deformities, no cyanosis or clubbing ? ?Neuro: alert, awake, non  focal ? ?Skin: Warm, no lesions or rash ? ? ?   ?Assessment & Plan:  ?Asthma ?Mild intermittent asthma based on suspected bronchodilator response on pulmonary function testing in the past.  She has a f

## 2022-03-07 NOTE — Assessment & Plan Note (Signed)
Mild intermittent asthma based on suspected bronchodilator response on pulmonary function testing in the past.  She has a few factors that may be causing her asthma to progress including recent COVID-19, recent pregnancy.  We will plan to repeat her pulmonary function testing to quantify her degree of obstruction.  For now I will continue the Flovent 44 mcg since she believes she may be benefiting (certainly her albuterol use went down).  She still dealing with chronic cough, Worse post COVID. ?

## 2022-03-08 DIAGNOSIS — Z419 Encounter for procedure for purposes other than remedying health state, unspecified: Secondary | ICD-10-CM | POA: Diagnosis not present

## 2022-03-10 ENCOUNTER — Emergency Department (HOSPITAL_COMMUNITY): Payer: Medicaid Other

## 2022-03-10 ENCOUNTER — Inpatient Hospital Stay (HOSPITAL_COMMUNITY)
Admission: EM | Admit: 2022-03-10 | Discharge: 2022-03-14 | DRG: 872 | Disposition: A | Discharge status: Home / Self Care | Payer: Medicaid Other | Attending: Family Medicine | Admitting: Family Medicine

## 2022-03-10 DIAGNOSIS — R7303 Prediabetes: Secondary | ICD-10-CM | POA: Diagnosis present

## 2022-03-10 DIAGNOSIS — A4151 Sepsis due to Escherichia coli [E. coli]: Principal | ICD-10-CM | POA: Diagnosis present

## 2022-03-10 DIAGNOSIS — Z8249 Family history of ischemic heart disease and other diseases of the circulatory system: Secondary | ICD-10-CM

## 2022-03-10 DIAGNOSIS — E876 Hypokalemia: Secondary | ICD-10-CM | POA: Diagnosis present

## 2022-03-10 DIAGNOSIS — N12 Tubulo-interstitial nephritis, not specified as acute or chronic: Secondary | ICD-10-CM | POA: Diagnosis present

## 2022-03-10 DIAGNOSIS — E669 Obesity, unspecified: Secondary | ICD-10-CM | POA: Diagnosis present

## 2022-03-10 DIAGNOSIS — Z833 Family history of diabetes mellitus: Secondary | ICD-10-CM

## 2022-03-10 DIAGNOSIS — F411 Generalized anxiety disorder: Secondary | ICD-10-CM | POA: Diagnosis present

## 2022-03-10 DIAGNOSIS — D509 Iron deficiency anemia, unspecified: Secondary | ICD-10-CM | POA: Diagnosis present

## 2022-03-10 DIAGNOSIS — Z79899 Other long term (current) drug therapy: Secondary | ICD-10-CM

## 2022-03-10 DIAGNOSIS — R109 Unspecified abdominal pain: Secondary | ICD-10-CM | POA: Diagnosis not present

## 2022-03-10 DIAGNOSIS — K449 Diaphragmatic hernia without obstruction or gangrene: Secondary | ICD-10-CM | POA: Diagnosis present

## 2022-03-10 DIAGNOSIS — Z87891 Personal history of nicotine dependence: Secondary | ICD-10-CM

## 2022-03-10 DIAGNOSIS — R000 Tachycardia, unspecified: Secondary | ICD-10-CM | POA: Diagnosis not present

## 2022-03-10 DIAGNOSIS — K8020 Calculus of gallbladder without cholecystitis without obstruction: Secondary | ICD-10-CM | POA: Diagnosis present

## 2022-03-10 DIAGNOSIS — Z20822 Contact with and (suspected) exposure to covid-19: Secondary | ICD-10-CM | POA: Diagnosis present

## 2022-03-10 DIAGNOSIS — A419 Sepsis, unspecified organism: Secondary | ICD-10-CM | POA: Diagnosis not present

## 2022-03-10 DIAGNOSIS — R0602 Shortness of breath: Secondary | ICD-10-CM | POA: Diagnosis not present

## 2022-03-10 DIAGNOSIS — K219 Gastro-esophageal reflux disease without esophagitis: Secondary | ICD-10-CM | POA: Diagnosis present

## 2022-03-10 DIAGNOSIS — J4520 Mild intermittent asthma, uncomplicated: Secondary | ICD-10-CM | POA: Diagnosis present

## 2022-03-10 DIAGNOSIS — R1011 Right upper quadrant pain: Secondary | ICD-10-CM | POA: Diagnosis not present

## 2022-03-10 DIAGNOSIS — E038 Other specified hypothyroidism: Secondary | ICD-10-CM | POA: Diagnosis present

## 2022-03-10 DIAGNOSIS — N179 Acute kidney failure, unspecified: Secondary | ICD-10-CM | POA: Diagnosis present

## 2022-03-10 DIAGNOSIS — Z6841 Body Mass Index (BMI) 40.0 and over, adult: Secondary | ICD-10-CM

## 2022-03-10 LAB — I-STAT CHEM 8, ED
BUN: 10 mg/dL (ref 6–20)
Calcium, Ion: 1.1 mmol/L — ABNORMAL LOW (ref 1.15–1.40)
Chloride: 98 mmol/L (ref 98–111)
Creatinine, Ser: 1.5 mg/dL — ABNORMAL HIGH (ref 0.44–1.00)
Glucose, Bld: 140 mg/dL — ABNORMAL HIGH (ref 70–99)
HCT: 34 % — ABNORMAL LOW (ref 36.0–46.0)
Hemoglobin: 11.6 g/dL — ABNORMAL LOW (ref 12.0–15.0)
Potassium: 3.2 mmol/L — ABNORMAL LOW (ref 3.5–5.1)
Sodium: 135 mmol/L (ref 135–145)
TCO2: 26 mmol/L (ref 22–32)

## 2022-03-10 LAB — BLOOD CULTURE ID PANEL (REFLEXED) - BCID2

## 2022-03-10 LAB — HEPATIC FUNCTION PANEL
ALT: 16 U/L (ref 0–44)
AST: 11 U/L — ABNORMAL LOW (ref 15–41)
Albumin: 2.7 g/dL — ABNORMAL LOW (ref 3.5–5.0)
Alkaline Phosphatase: 76 U/L (ref 38–126)
Bilirubin, Direct: 0.2 mg/dL (ref 0.0–0.2)
Indirect Bilirubin: 0.5 mg/dL (ref 0.3–0.9)
Total Bilirubin: 0.7 mg/dL (ref 0.3–1.2)
Total Protein: 6.4 g/dL — ABNORMAL LOW (ref 6.5–8.1)

## 2022-03-10 LAB — URINALYSIS, ROUTINE W REFLEX MICROSCOPIC
Bilirubin Urine: NEGATIVE
Glucose, UA: NEGATIVE mg/dL
Ketones, ur: NEGATIVE mg/dL
Nitrite: NEGATIVE
Protein, ur: 100 mg/dL — AB
Specific Gravity, Urine: 1.006 (ref 1.005–1.030)
WBC, UA: >50 WBC/hpf — ABNORMAL HIGH (ref 0–5)
pH: 6 (ref 5.0–8.0)

## 2022-03-10 LAB — CBC WITH DIFFERENTIAL/PLATELET
Abs Immature Granulocytes: 0 10*3/uL (ref 0.00–0.07)
Basophils Absolute: 0 10*3/uL (ref 0.0–0.1)
Basophils Relative: 0 %
Eosinophils Absolute: 0 10*3/uL (ref 0.0–0.5)
Eosinophils Relative: 0 %
HCT: 32 % — ABNORMAL LOW (ref 36.0–46.0)
Hemoglobin: 10.7 g/dL — ABNORMAL LOW (ref 12.0–15.0)
Lymphocytes Relative: 6 %
Lymphs Abs: 1.8 10*3/uL (ref 0.7–4.0)
MCH: 27 pg (ref 26.0–34.0)
MCHC: 33.4 g/dL (ref 30.0–36.0)
MCV: 80.8 fL (ref 80.0–100.0)
Monocytes Absolute: 0.9 10*3/uL (ref 0.1–1.0)
Monocytes Relative: 3 %
Neutro Abs: 27.8 10*3/uL — ABNORMAL HIGH (ref 1.7–7.7)
Neutrophils Relative %: 91 %
Platelets: 202 10*3/uL (ref 150–400)
RBC: 3.96 MIL/uL (ref 3.87–5.11)
RDW: 14.8 % (ref 11.5–15.5)
WBC: 30.5 10*3/uL — ABNORMAL HIGH (ref 4.0–10.5)
nRBC: 0 % (ref 0.0–0.2)
nRBC: 0 /100 WBC

## 2022-03-10 LAB — BASIC METABOLIC PANEL
Anion gap: 7 (ref 5–15)
BUN: 10 mg/dL (ref 6–20)
CO2: 24 mmol/L (ref 22–32)
Calcium: 8.5 mg/dL — ABNORMAL LOW (ref 8.9–10.3)
Chloride: 102 mmol/L (ref 98–111)
Creatinine, Ser: 1.58 mg/dL — ABNORMAL HIGH (ref 0.44–1.00)
GFR, Estimated: 45 mL/min — ABNORMAL LOW (ref >60)
Glucose, Bld: 142 mg/dL — ABNORMAL HIGH (ref 70–99)
Potassium: 3.1 mmol/L — ABNORMAL LOW (ref 3.5–5.1)
Sodium: 133 mmol/L — ABNORMAL LOW (ref 135–145)

## 2022-03-10 LAB — RESP PANEL BY RT-PCR (FLU A&B, COVID) ARPGX2
Influenza A by PCR: NEGATIVE
Influenza B by PCR: NEGATIVE
SARS Coronavirus 2 by RT PCR: NEGATIVE

## 2022-03-10 LAB — LIPASE, BLOOD: Lipase: 21 U/L (ref 11–51)

## 2022-03-10 LAB — I-STAT BETA HCG BLOOD, ED (MC, WL, AP ONLY): I-stat hCG, quantitative: <5 m[IU]/mL (ref <5)

## 2022-03-10 LAB — LACTIC ACID, PLASMA: Lactic Acid, Venous: 1.6 mmol/L (ref 0.5–1.9)

## 2022-03-10 MED ORDER — POLYETHYLENE GLYCOL 3350 17 G PO PACK: 17.0000 g | PACK | Freq: Every day | ORAL | Status: DC

## 2022-03-10 MED ORDER — DOCUSATE SODIUM 100 MG PO CAPS
100.0000 mg | ORAL_CAPSULE | Freq: Two times a day (BID) | ORAL | Status: DC
Start: 2022-03-10 — End: 2022-03-14
  Administered 2022-03-10 – 2022-03-14 (×8): 100 mg via ORAL
  Filled 2022-03-10 (×8): qty 1

## 2022-03-10 MED ORDER — ACETAMINOPHEN 325 MG PO TABS
650.0000 mg | ORAL_TABLET | Freq: Four times a day (QID) | ORAL | Status: DC | PRN
  Administered 2022-03-11 – 2022-03-13 (×7): 650 mg via ORAL
  Filled 2022-03-10 (×7): qty 2

## 2022-03-10 MED ORDER — LORATADINE 10 MG PO TABS
10.0000 mg | ORAL_TABLET | Freq: Every day | ORAL | Status: DC
  Administered 2022-03-11 – 2022-03-14 (×4): 10 mg via ORAL
  Filled 2022-03-10 (×5): qty 1

## 2022-03-10 MED ORDER — LACTATED RINGERS IV SOLN: INTRAVENOUS | Status: AC

## 2022-03-10 MED ORDER — FLUTICASONE PROPIONATE HFA 110 MCG/ACT IN AERO: 2.0000 | INHALATION_SPRAY | Freq: Two times a day (BID) | RESPIRATORY_TRACT | Status: DC

## 2022-03-10 MED ORDER — LACTATED RINGERS IV BOLUS
1000.0000 mL | Freq: Once | INTRAVENOUS | Status: AC
Start: 2022-03-10 — End: 2022-03-10
  Administered 2022-03-10: 1000 mL via INTRAVENOUS

## 2022-03-10 MED ORDER — ALBUTEROL SULFATE (2.5 MG/3ML) 0.083% IN NEBU: 3.0000 mL | INHALATION_SOLUTION | RESPIRATORY_TRACT | Status: DC | PRN

## 2022-03-10 MED ORDER — FENTANYL CITRATE PF 50 MCG/ML IJ SOSY
50.0000 ug | PREFILLED_SYRINGE | Freq: Once | INTRAMUSCULAR | Status: AC
  Administered 2022-03-10: 50 ug via INTRAVENOUS
  Filled 2022-03-10: qty 1

## 2022-03-10 MED ORDER — HYDROXYZINE HCL 25 MG PO TABS: 25.0000 mg | ORAL_TABLET | Freq: Three times a day (TID) | ORAL | Status: DC | PRN

## 2022-03-10 MED ORDER — SODIUM CHLORIDE 0.9 % IV SOLN: INTRAVENOUS | Status: DC

## 2022-03-10 MED ORDER — ONDANSETRON HCL 4 MG/2ML IJ SOLN
4.0000 mg | Freq: Once | INTRAMUSCULAR | Status: AC
  Administered 2022-03-10: 4 mg via INTRAVENOUS
  Filled 2022-03-10: qty 2

## 2022-03-10 MED ORDER — ENOXAPARIN SODIUM 60 MG/0.6ML IJ SOSY
50.0000 mg | PREFILLED_SYRINGE | INTRAMUSCULAR | Status: DC
  Administered 2022-03-12 – 2022-03-14 (×3): 50 mg via SUBCUTANEOUS
  Filled 2022-03-10 (×2): qty 0.6
  Filled 2022-03-10: qty 0.5
  Filled 2022-03-10: qty 0.6

## 2022-03-10 MED ORDER — FLUTICASONE PROPIONATE 50 MCG/ACT NA SUSP
1.0000 | Freq: Every day | NASAL | Status: DC | PRN
  Filled 2022-03-10: qty 16

## 2022-03-10 MED ORDER — METRONIDAZOLE 500 MG/100ML IV SOLN
500.0000 mg | Freq: Once | INTRAVENOUS | Status: DC
  Administered 2022-03-10: 500 mg via INTRAVENOUS
  Filled 2022-03-10: qty 100

## 2022-03-10 MED ORDER — BUDESONIDE 0.25 MG/2ML IN SUSP
0.2500 mg | Freq: Two times a day (BID) | RESPIRATORY_TRACT | Status: DC
  Administered 2022-03-10 – 2022-03-14 (×8): 0.25 mg via RESPIRATORY_TRACT
  Filled 2022-03-10 (×8): qty 2

## 2022-03-10 MED ORDER — SODIUM CHLORIDE 0.9 % IV SOLN
2.0000 g | Freq: Once | INTRAVENOUS | Status: DC
  Administered 2022-03-10: 2 g via INTRAVENOUS
  Filled 2022-03-10: qty 20

## 2022-03-10 MED ORDER — LACTATED RINGERS IV BOLUS (SEPSIS)
1000.0000 mL | Freq: Once | INTRAVENOUS | Status: AC
  Administered 2022-03-10: 1000 mL via INTRAVENOUS

## 2022-03-10 MED ORDER — ACETAMINOPHEN 500 MG PO TABS
1000.0000 mg | ORAL_TABLET | Freq: Once | ORAL | Status: AC
  Administered 2022-03-10: 1000 mg via ORAL
  Filled 2022-03-10: qty 2

## 2022-03-10 MED ORDER — FAMOTIDINE IN NACL 20-0.9 MG/50ML-% IV SOLN
20.0000 mg | Freq: Once | INTRAVENOUS | Status: AC
  Administered 2022-03-10: 20 mg via INTRAVENOUS
  Filled 2022-03-10: qty 50

## 2022-03-10 MED ORDER — PIPERACILLIN-TAZOBACTAM 3.375 G IVPB
3.3750 g | Freq: Three times a day (TID) | INTRAVENOUS | Status: DC
Start: 2022-03-10 — End: 2022-03-11
  Administered 2022-03-10 – 2022-03-11 (×3): 3.375 g via INTRAVENOUS
  Filled 2022-03-10 (×3): qty 50

## 2022-03-10 MED ORDER — FERROUS SULFATE 325 (65 FE) MG PO TABS
325.0000 mg | ORAL_TABLET | ORAL | Status: DC
  Administered 2022-03-10: 325 mg via ORAL
  Filled 2022-03-10: qty 1

## 2022-03-10 MED ORDER — ESCITALOPRAM OXALATE 10 MG PO TABS
10.0000 mg | ORAL_TABLET | Freq: Every day | ORAL | Status: DC
  Administered 2022-03-10 – 2022-03-14 (×5): 10 mg via ORAL
  Filled 2022-03-10 (×5): qty 1

## 2022-03-10 MED ORDER — IOHEXOL 300 MG/ML  SOLN
100.0000 mL | Freq: Once | INTRAMUSCULAR | Status: AC | PRN
  Administered 2022-03-10: 100 mL via INTRAVENOUS

## 2022-03-10 MED ORDER — PRENATAL MULTIVITAMIN CH
1.0000 | ORAL_TABLET | Freq: Every day | ORAL | Status: DC
  Administered 2022-03-11 – 2022-03-14 (×4): 1 via ORAL
  Filled 2022-03-10 (×4): qty 1

## 2022-03-10 MED ORDER — OXYCODONE HCL 5 MG PO TABS
5.0000 mg | ORAL_TABLET | ORAL | Status: AC | PRN
  Administered 2022-03-10 – 2022-03-13 (×6): 5 mg via ORAL
  Filled 2022-03-10 (×6): qty 1

## 2022-03-10 MED ORDER — ACETAMINOPHEN 325 MG PO TABS
ORAL_TABLET | ORAL | Status: AC
  Administered 2022-03-10: 650 mg via ORAL
  Filled 2022-03-10: qty 2

## 2022-03-10 MED ORDER — POTASSIUM CHLORIDE 10 MEQ/100ML IV SOLN
10.0000 meq | INTRAVENOUS | Status: AC
  Administered 2022-03-10 (×4): 10 meq via INTRAVENOUS
  Filled 2022-03-10 (×4): qty 100

## 2022-03-10 MED ORDER — PANTOPRAZOLE SODIUM 20 MG PO TBEC: 20.0000 mg | DELAYED_RELEASE_TABLET | Freq: Every day | ORAL | Status: DC

## 2022-03-10 NOTE — Hospital Course (Addendum)
Veronica [name] is a 31 y.o. female presenting with abdominal pain and fever. PMH is significant for history of UTIs, asthma, GERD, seasonal allergies, anxiety, anemia.  ? ?Pyelonephritis  E Coli Bacteremia ?Patient presented to the [facility] on 4/3 with nausea, vomiting, and diarrhea along. On arrival to the ED she was found to meet sepsis criteria with fever, tachycardia, tachypnea, and WBC elevated to 30.  Initial imaging demonstrated a right upper quadrant ultrasound concerning for cholecystitis with partially thickened gallbladder wall and sonographic Murphy sign.  General surgery was consulted and requested a CT abdomen/pelvis which did not show signs of cholecystitis but did demonstrate concern for pyelonephritis. She was admitted to our service for IV antibiotics and IV fluids. Her blood culture resulted positive for E Coli bacteremia.  She received IV Zosyn 4/3-4/4 and was transitioned to ceftriaxone from 4/4 - 4/7. She continued to fever intermittently until the morning of 4/6. At the time of discharge, she had remained fever free for >24 hours and had successfully transitioned to oral antibiotics with cefdinir. She was discharged home in stable condition with instructions to complete a total 10 day course of antibiotics with cefdinir. ? ?AKI ?Patient was found to have a cr elevated to 1.5 on admission, up from a baseline around 1. Her creatinine downtrended over the course of hospitalization to 1.21 at the time of discharge. ? ?Items for PCP follow-up ?1) Patient had nighttime desaturations raising concern for possible OSA. Recommend outpt sleep study.  ?2) Recommend repeat BMP to ensure improvement in Cr ?3) Patient will complete course of antibiotics on 4/11 ?

## 2022-03-10 NOTE — Sepsis Progress Note (Signed)
Code Sepsis protocol being monitored by e[employer]. 

## 2022-03-10 NOTE — Progress Notes (Signed)
PHARMACY - PHYSICIAN COMMUNICATION ?CRITICAL VALUE ALERT - BLOOD CULTURE IDENTIFICATION (BCID) ? ?Veronica [name] is an 31 y.o. female who presented to [facility] on 03/10/2022 with a chief complaint of abd pain ? ?Assessment:  Pt admitted with possible pyelo. Micro called with BCID result of 1/4 bottles with e.coli w/o resistant genes. She got one dose of ceftriaxone before changing to zosyn empirically. Team would like to cont with zosyn for tonight and change tomorrow after discussing with attending.  ? ?Name of physician (or Provider) Contacted: Dr Marcina Millard and Larae Grooms ? ?Current antibiotics: Zosyn ? ?Changes to prescribed antibiotics recommended:  ?Change to ceftriaxone in AM after discussing with attending.  ? ?Results for orders placed or performed during the hospital encounter of 03/10/22  ?Blood Culture ID Panel (Reflexed) (Collected: 03/10/2022  8:00 AM)  ?Result Value Ref Range  ? Enterococcus faecalis NOT DETECTED NOT DETECTED  ? Enterococcus Faecium NOT DETECTED NOT DETECTED  ? Listeria monocytogenes NOT DETECTED NOT DETECTED  ? Staphylococcus species NOT DETECTED NOT DETECTED  ? Staphylococcus aureus (BCID) NOT DETECTED NOT DETECTED  ? Staphylococcus epidermidis NOT DETECTED NOT DETECTED  ? Staphylococcus lugdunensis NOT DETECTED NOT DETECTED  ? Streptococcus species NOT DETECTED NOT DETECTED  ? Streptococcus agalactiae NOT DETECTED NOT DETECTED  ? Streptococcus pneumoniae NOT DETECTED NOT DETECTED  ? Streptococcus pyogenes NOT DETECTED NOT DETECTED  ? A.calcoaceticus-baumannii NOT DETECTED NOT DETECTED  ? Bacteroides fragilis NOT DETECTED NOT DETECTED  ? Enterobacterales DETECTED (A) NOT DETECTED  ? Enterobacter cloacae complex NOT DETECTED NOT DETECTED  ? Escherichia coli DETECTED (A) NOT DETECTED  ? Klebsiella aerogenes NOT DETECTED NOT DETECTED  ? Klebsiella oxytoca NOT DETECTED NOT DETECTED  ? Klebsiella pneumoniae NOT DETECTED NOT DETECTED  ? Proteus species NOT DETECTED NOT DETECTED  ? Salmonella  species NOT DETECTED NOT DETECTED  ? Serratia marcescens NOT DETECTED NOT DETECTED  ? Haemophilus influenzae NOT DETECTED NOT DETECTED  ? Neisseria meningitidis NOT DETECTED NOT DETECTED  ? Pseudomonas aeruginosa NOT DETECTED NOT DETECTED  ? Stenotrophomonas maltophilia NOT DETECTED NOT DETECTED  ? Candida albicans NOT DETECTED NOT DETECTED  ? Candida auris NOT DETECTED NOT DETECTED  ? Candida glabrata NOT DETECTED NOT DETECTED  ? Candida krusei NOT DETECTED NOT DETECTED  ? Candida parapsilosis NOT DETECTED NOT DETECTED  ? Candida tropicalis NOT DETECTED NOT DETECTED  ? Cryptococcus neoformans/gattii NOT DETECTED NOT DETECTED  ? CTX-M ESBL NOT DETECTED NOT DETECTED  ? Carbapenem resistance IMP NOT DETECTED NOT DETECTED  ? Carbapenem resistance KPC NOT DETECTED NOT DETECTED  ? Carbapenem resistance NDM NOT DETECTED NOT DETECTED  ? Carbapenem resist OXA 48 LIKE NOT DETECTED NOT DETECTED  ? Carbapenem resistance VIM NOT DETECTED NOT DETECTED  ? ? ?Onnie Boer, PharmD, BCIDP, AAHIVP, CPP ?Infectious Disease Pharmacist ?03/10/2022 10:48 PM ? ? ?

## 2022-03-10 NOTE — ED Provider Notes (Signed)
?[place] ?Provider Note ? ? ?CSN: [id number] ?Arrival date & time: 03/10/22  [id number] ? ?  ? ?History ? ?Chief Complaint  ?Patient presents with  ? Abdominal Pain  ? ? ?Veronica [name] is a 31 y.o. female. ? ? ?Abdominal Pain ? ? 31 year old female with a hx of hiatal hernia, asthma, GERD, obesity who presents to the ED with n/v/d since Saturday and abdominal pain. On Saturday, she was drinking alcohol with her sister. She subsequently developed nausea and vomiting. NBNB x3 on Saturday, 4x on Sunday. Some bloody component to emesis today. Hasnt been able to eat or drink since Saturday. She did smoke marijuana on Saturday. Has a hx of hiatal hernia. States that she has been having RUQ and epigastric abdominal pain. Endorses chills and low grade temp.  ? ?Home Medications ?Prior to Admission medications   ?Medication Sig Start Date End Date Taking? Authorizing Provider  ?acetaminophen (TYLENOL) 500 MG tablet Take 2 tablets (1,000 mg total) by mouth every 6 (six) hours as needed for moderate pain. 10/09/21  Yes Roma Schanz, CNM  ?albuterol (VENTOLIN HFA) 108 (90 Base) MCG/ACT inhaler INHALE 2 PUFFS BY MOUTH EVERY 4 HOURS AS NEEDED FOR WHEEZING OR SHORTNESS OF BREATH (COUGH). ?Patient taking differently: Inhale 2 puffs into the lungs every 4 (four) hours as needed for shortness of breath or wheezing. 10/15/20  Yes Patriciaann Clan, DO  ?cetirizine (ZYRTEC) 5 MG tablet Take 5 mg by mouth daily.   Yes [provider]  ?docusate sodium (COLACE) 100 MG capsule Take 1 capsule (100 mg total) by mouth 2 (two) times daily. 06/01/21  Yes Gavin Pound, CNM  ?escitalopram (LEXAPRO) 10 MG tablet Take 1 tablet (10 mg total) by mouth daily. 02/05/22  Yes Paige, Weldon Picking, DO  ?ferrous sulfate 325 (65 FE) MG tablet Take 1 tablet (325 mg total) by mouth every other day. 12/19/21  Yes Paige, Weldon Picking, DO  ?fluticasone (FLONASE) 50 MCG/ACT nasal spray Place 1 spray into both nostrils  daily as needed for allergies or rhinitis. 1 spray in each nostril every day ?Patient taking differently: Place 1 spray into both nostrils daily as needed for allergies or rhinitis. 12/20/21  Yes Zola Button, MD  ?fluticasone (FLOVENT HFA) 110 MCG/ACT inhaler Inhale 2 puffs into the lungs 2 (two) times daily. 12/20/21  Yes Zola Button, MD  ?hydrOXYzine (ATARAX) 25 MG tablet Take 1 tablet (25 mg total) by mouth 3 (three) times daily as needed for anxiety. 01/30/22  Yes Pashayan, Redgie Grayer, MD  ?NIFEdipine (PROCARDIA-XL/NIFEDICAL-XL) 30 MG 24 hr tablet Take 1 tablet (30 mg total) by mouth 2 (two) times daily. 02/25/22  Yes Paige, Weldon Picking, DO  ?norgestimate-ethinyl estradiol (SPRINTEC 28) 0.25-35 MG-MCG tablet Take 1 tablet by mouth daily. 12/19/21  Yes Paige, Weldon Picking, DO  ?pantoprazole (PROTONIX) 20 MG tablet Take 20 mg by mouth 2 (two) times daily as needed for heartburn. 02/20/22  Yes [provider]  ?polyethylene glycol (MIRALAX / GLYCOLAX) 17 g packet Take 17 g by mouth daily. 06/01/21  Yes Gavin Pound, CNM  ?Prenatal Vit-Fe Fumarate-FA (PRENATAL COMPLETE) 14-0.4 MG TABS Take 1 tablet by mouth daily. 02/03/21  Yes Dorie Rank, MD  ?benzonatate (TESSALON) 100 MG capsule Take 1 capsule (100 mg total) by mouth every 8 (eight) hours. ?Patient not taking: Reported on 03/07/2022 02/04/22   Hans Eden, NP  ?pantoprazole (PROTONIX) 40 MG tablet Take 1 tablet (40 mg total) by mouth daily. ?  Patient not taking: Reported on 03/10/2022 10/22/21   Woodroe Mode, MD  ?predniSONE (DELTASONE) 20 MG tablet Take 2 tablets (40 mg total) by mouth daily. ?Patient not taking: Reported on 03/10/2022 02/04/22   Hans Eden, NP  ?promethazine-dextromethorphan (PROMETHAZINE-DM) 6.25-15 MG/5ML syrup Take 5 mLs by mouth 4 (four) times daily as needed for cough. ?Patient not taking: Reported on 03/07/2022 02/04/22   Hans Eden, NP  ?Semaglutide,0.25 or 0.5MG /DOS, (OZEMPIC, 0.25 OR 0.5 MG/DOSE,) 2 MG/1.5ML SOPN Inject  0.25 mg into the skin once a week. ?Patient not taking: Reported on 03/10/2022 02/25/22   Shary Key, DO  ?   ? ?Allergies    ?Morphine and related and Motrin [ibuprofen]   ? ?Review of Systems   ?Review of Systems  ?Gastrointestinal:  Positive for abdominal pain.  ? ?Physical Exam ?Updated Vital Signs ?BP 107/84   Pulse (!) 112   Temp (!) 102.3 ?F (39.1 ?C) (Rectal)   Resp (!) 25   LMP 02/19/2022 (Approximate)   SpO2 98%  ?Physical Exam ?Vitals and nursing note reviewed.  ?Constitutional:   ?   General: She is not in acute distress. ?HENT:  ?   Head: Normocephalic and atraumatic.  ?Eyes:  ?   Conjunctiva/sclera: Conjunctivae normal.  ?   Pupils: Pupils are equal, round, and reactive to light.  ?Cardiovascular:  ?   Rate and Rhythm: Regular rhythm. Tachycardia present.  ?Pulmonary:  ?   Effort: Pulmonary effort is normal. No respiratory distress.  ?   Breath sounds: Normal breath sounds.  ?Abdominal:  ?   General: There is no distension.  ?   Tenderness: There is abdominal tenderness in the right upper quadrant and epigastric area. There is guarding.  ?Musculoskeletal:     ?   General: No deformity or signs of injury.  ?   Cervical back: Neck supple.  ?Skin: ?   Findings: No lesion or rash.  ?Neurological:  ?   General: No focal deficit present.  ?   Mental Status: She is alert. Mental status is at baseline.  ? ? ?ED Results / Procedures / Treatments   ?Labs ?(all labs ordered are listed, but only abnormal results are displayed) ?Labs Reviewed  ?CBC WITH DIFFERENTIAL/PLATELET - Abnormal; Notable for the following components:  ?    Result Value  ? WBC 30.5 (*)   ? Hemoglobin 10.7 (*)   ? HCT 32.0 (*)   ? Neutro Abs 27.8 (*)   ? All other components within normal limits  ?BASIC METABOLIC PANEL - Abnormal; Notable for the following components:  ? Sodium 133 (*)   ? Potassium 3.1 (*)   ? Glucose, Bld 142 (*)   ? Creatinine, Ser 1.58 (*)   ? Calcium 8.5 (*)   ? GFR, Estimated 45 (*)   ? All other components  within normal limits  ?HEPATIC FUNCTION PANEL - Abnormal; Notable for the following components:  ? Total Protein 6.4 (*)   ? Albumin 2.7 (*)   ? AST 11 (*)   ? All other components within normal limits  ?URINALYSIS, ROUTINE W REFLEX MICROSCOPIC - Abnormal; Notable for the following components:  ? APPearance HAZY (*)   ? Hgb urine dipstick MODERATE (*)   ? Protein, ur 100 (*)   ? Leukocytes,Ua LARGE (*)   ? WBC, UA >50 (*)   ? Bacteria, UA RARE (*)   ? All other components within normal limits  ?I-STAT CHEM 8, ED - Abnormal; Notable  for the following components:  ? Potassium 3.2 (*)   ? Creatinine, Ser 1.50 (*)   ? Glucose, Bld 140 (*)   ? Calcium, Ion 1.10 (*)   ? Hemoglobin 11.6 (*)   ? HCT 34.0 (*)   ? All other components within normal limits  ?CULTURE, BLOOD (ROUTINE X 2)  ?CULTURE, BLOOD (ROUTINE X 2)  ?URINE CULTURE  ?LIPASE, BLOOD  ?LACTIC ACID, PLASMA  ?I-STAT BETA HCG BLOOD, ED ([facility], [facility], [facility] ONLY)  ? ? ?EKG ?EKG Interpretation ? ?Date/Time:  Monday March 10 2022 08:12:01 EDT ?Ventricular Rate:  124 ?PR Interval:  153 ?QRS Duration: 89 ?QT Interval:  285 ?QTC Calculation: 410 ?R Axis:   75 ?Text Interpretation: Sinus tachycardia Confirmed by Regan Lemming ([id number]) on 03/10/2022 10:48:12 AM ? ?Radiology ?CT ABDOMEN PELVIS W CONTRAST ? ?Result Date: 03/10/2022 ?CLINICAL DATA:  "Acute abdominal pain".  Right upper quadrant pain. EXAM: CT ABDOMEN AND PELVIS WITH CONTRAST TECHNIQUE: Multidetector CT imaging of the abdomen and pelvis was performed using the standard protocol following bolus administration of intravenous contrast. RADIATION DOSE REDUCTION: This exam was performed according to the departmental dose-optimization program which includes automated exposure control, adjustment of the mA and/or kV according to patient size and/or use of iterative reconstruction technique. CONTRAST:  137mL OMNIPAQUE IOHEXOL 300 MG/ML  SOLN COMPARISON:  Today's ultrasound of the right upper quadrant, dictated separately. No  comparison CT. FINDINGS: Lower chest: Clear lung bases. Mild cardiomegaly, without pericardial or pleural effusion. Hepatobiliary: Hepatomegaly at 20.9 cm craniocaudal. No focal liver lesion. No calcified gallstones. N

## 2022-03-10 NOTE — Progress Notes (Signed)
FPTS Interim Progress Note ? ?S:Patient reports that the pain is still a 9/10. The fentanyl only lasts about 25 minutes and then the pain returns. She reports no drug use and has no other issues at this time.  ? ?O: ?BP 99/88   Pulse (!) 105   Temp (!) 102.3 ?F (39.1 ?C) (Rectal)   Resp (!) 22   LMP 02/19/2022 (Approximate)   SpO2 99%   ?Gen: well-nourished, NAD, supine in bed ?CV: RRR on telemetry with HR in the 120s ?Pulm: breathing comfortably on room air, no increased WOB ? ?A/P: ?Pain secondary to Pyelonephritis ?- Discontinue fentanyl  ?- Switch to oxycodone IR 5mg  q4h PRN x6 doses.  ?- Continue to reassess need for pain medication ? ?Rise Patience, DO ?03/10/2022, 1:51 PM ?PGY-2, [place] ?Service pager [phone] ? ?

## 2022-03-10 NOTE — H&P (Signed)
Family Medicine Teaching Service ?Hospital Admission History and Physical ?Service Pager: [phone] ? ?Patient name: Veronica [name] Medical record number: [id number] ?Date of birth: 11-30-1991 Age: 31 y.o. Gender: female ? ?Primary Care Provider: Cora CollumPaige, Victoria J, DO ?Consultants: General surgery ?Code Status: Full ?Preferred Emergency Contact: Shanda BumpsJessica Miner-[phone]-Sister ? ?Chief Complaint: Abdominal pain ? ?Assessment and Plan: ?Veronica [name] is a 31 y.o. female presenting with abdominal pain and fever. PMH is significant for history of UTIs, asthma, GERD, seasonal allergies, anxiety, anemia ? ?Sepsis criteria  pyelonephritis: ?31 year old female presenting meeting sepsis criteria with fever, tachycardia and abdominal pain.  Labs showed a significantly elevated WBC of 30, AKI with creatinine of 1.5, and lactic acid of 1.6.  Initial imaging with right upper quadrant ultrasound showed signs concerning for cholecystitis with partially thickened gallbladder wall and sonographic Murphy sign.  General surgery was consulted and subsequent CT abdomen pelvis showed signs concerning for heterogenous right renal enhancement suggestive of pyelonephritis.  Further CT abdomen pelvis documentation showed no signs of cholecystitis despite the ultrasound findings.  In the emergency department patient was provided fluid resuscitation and started on broad-spectrum antibiotics with ceftriaxone and metronidazole and was changed to Zosyn.  She had a urinalysis that was suggestive of a urinary tract infection with moderate hemoglobin, large leukocytes, occasional bacteria and greater than 50 WBCs.  ED provider discussed the case with general surgery who felt this was more likely due to pyelonephritis rather than gallbladder etiology. ?-Admit to med telemetry, attending Dr. Jennette KettleNeal ?-We will discontinue Zosyn and initiate ceftriaxone ?-General surgery signed off, appreciate their assistance ?-Continuous cardiac  monitoring ?-Frequent abdominal exams ?-Monitor for fevers ?-AM CBC and BMP ?-Follow-up blood cultures/urine culture ? ?Prediabetes ?Diet controlled.  Last A1c 5.4 on 02/25/2022 ? ?AKI: ?Likely in the setting of meeting sepsis criteria due to pyelonephritis.  Initial creatinine of 1.50 with baseline around 1.  Was given fluid resuscitation in the ED.  Was started on Zosyn in the ED for sepsis. ?-AM BMP ?-Avoid nephrotoxic agents ?-Provide fluid resuscitation as appropriate ? ?Hypertension: ?Home meds include nifedipine which she started during pregnancy due to preeclampsia.  She states she continued on this after pregnancy as she was breast-feeding.  She states she stopped breast-feeding several months ago.  Blood pressures controlled today.  We will hold home nifedipine ?-If becomes hypertensive recommended alternative blood pressure control at time of discharge. ? ?Hypokalemia: ?Initial potassium of 3.2.  Replaced in the ED by 10meq IV x4 runs. ?-AM BMP ? ?Asthma: ?Recent office visit with pulmonology on 03/07/2022.  Has mild intermittent asthma.  Continues on Flovent 44 mcg with as needed albuterol.  No wheezing on physical exam today. ?-Continue home meds ? ?GERD  hiatal hernia: ?Home meds include Protonix 20 mg daily as needed. ? ?Allergic rhinitis: ?Continues on sertraline and Flonase nasal spray.  Continue medications. ? ?Anxiety ?Home meds include Lexapro 10 mg daily.  Additionally uses as needed Atarax for anxiety. ?-Continue home meds ? ?Anemia: ?Home meds include ferrous sulfate 325 every other day.  Hemoglobin of 11.6 consistent with her baseline. ?-Continue iron supplement ? ?FEN/GI: Regular diet ?Prophylaxis: Lovenox ? ?Disposition: Progressive ? ?History of Present Illness:  Veronica [name] is a 31 y.o. female presenting with abdominal pain and fever.  She states that she started feeling some symptoms of dysuria last week.  She has had a few urinary tract infections in the past but does not recall  ever having to be admitted to the hospital for this.  She states that she initially thought she may have something going on with her gallbladder and this was reinforced with the ultrasound because she was having pain in the upper part of her stomach and had consumed alcohol for the first time at a sisters party on Saturday.  She states that Saturday she was having some vomiting x3 bouts and 4 bouts yesterday as well as 1 bout of vomiting this morning.  States that she did have some diarrhea this morning.  States that she has not had any fevers prior to coming into the hospital today where she was febrile. ? ?Tobacco-never ?Alcohol-had a small drink of alcohol for the first time on Saturday.  Does not regularly drink. ?Illicit drugs-did have marijuana for the first time on Saturday.  Denies ever having any other illicit drugs. ? ?Review Of Systems: Per HPI with the following additions:  ? ?Review of Systems  ?Constitutional:  Positive for fever.  ?HENT:  Positive for congestion.   ?Respiratory:  Positive for cough. Negative for shortness of breath and wheezing.   ?Cardiovascular:  Negative for chest pain.  ?Gastrointestinal:  Positive for abdominal pain, diarrhea, nausea and vomiting.  ?Genitourinary:  Positive for dysuria.  ?Musculoskeletal:  Positive for back pain.  ?Neurological:  Positive for headaches. Negative for dizziness.  ?Psychiatric/Behavioral:  Negative for confusion.    ? ?Patient Active Problem List  ? Diagnosis Date Noted  ? Allergic rhinitis 03/07/2022  ? Asthma 08/31/2020  ? Back pain 02/13/2020  ? Hiatal hernia 11/02/2019  ? Epigastric pain 09/13/2019  ? Subclinical hypothyroidism 05/19/2019  ? GAD (generalized anxiety disorder) 05/09/2019  ? Chronic cough 10/16/2017  ? BMI 50.0-59.9, adult (HCC) 02/27/2011  ? Anemia 02/27/2011  ? ? ?Past Medical History: ?Past Medical History:  ?Diagnosis Date  ? Abdominal pain 01/06/2012  ? Acute tension headache 07/27/2019  ? Anemia   ? Ankle pain   ? Asthma   ?  Birth control counseling 08/26/2011  ? BMI 40.0-44.9, adult (HCC) 02/27/2011  ? GERD (gastroesophageal reflux disease)   ? Hiatal hernia   ? Obesity (BMI 30-39.9)   ? Secondary amenorrhea 03/25/2018  ? ? ?Past Surgical History: ?Past Surgical History:  ?Procedure Laterality Date  ? CESAREAN SECTION  09/02/2010  ? For macrosomia, but son was 7 lb 12 oz  ? CESAREAN SECTION N/A 10/06/2021  ? Procedure: CESAREAN SECTION;  Surgeon: Brandywine Bing, MD;  Location: [facility] LD [facility];  Service: Obstetrics;  Laterality: N/A;  ? TONSILLECTOMY AND ADENOIDECTOMY    ? ? ?Social History: ?Social History  ? ?Tobacco Use  ? Smoking status: Never  ? Smokeless tobacco: Never  ?Vaping Use  ? Vaping Use: Never used  ?Substance Use Topics  ? Alcohol use: No  ? Drug use: Not Currently  ?  Comment: last used in teens  ? ?Additional social history:   ?Please also refer to relevant sections of EMR. ? ?Family History: ?Family History  ?Problem Relation Age of Onset  ? Diabetes Mother   ? Hypertension Mother   ? Obesity Mother   ? Heart attack Mother 4  ?     Death  ? Obesity Sister   ? Osteochondroma Sister   ? Diabetes Sister   ? Hypertension Sister   ? ? ?Allergies and Medications: ?Allergies  ?Allergen Reactions  ? Morphine And Related Other (See Comments)  ?  Causes "chest to be heavy"  ? Motrin [Ibuprofen]   ?  Was told to not take this because it would  flare stomach issues  ? ?No current facility-administered medications on file prior to encounter.  ? ?Current Outpatient Medications on File Prior to Encounter  ?Medication Sig Dispense Refill  ? acetaminophen (TYLENOL) 500 MG tablet Take 2 tablets (1,000 mg total) by mouth every 6 (six) hours as needed for moderate pain. 60 tablet 0  ? albuterol (VENTOLIN HFA) 108 (90 Base) MCG/ACT inhaler INHALE 2 PUFFS BY MOUTH EVERY 4 HOURS AS NEEDED FOR WHEEZING OR SHORTNESS OF BREATH (COUGH). (Patient taking differently: Inhale 2 puffs into the lungs every 4 (four) hours as needed for shortness of breath or  wheezing.) 18 g 0  ? cetirizine (ZYRTEC) 5 MG tablet Take 5 mg by mouth daily.    ? docusate sodium (COLACE) 100 MG capsule Take 1 capsule (100 mg total) by mouth 2 (two) times daily. 60 capsule 1  ? escitalopram (LEXAPRO) 10

## 2022-03-10 NOTE — ED Notes (Signed)
SPO2 dropped to 88% on room air, pt noted to be sleeping and snoring at the time, placed pt on 2L [place] O2 and quickly recovered to 97% ?

## 2022-03-10 NOTE — Consult Note (Addendum)
? ? ? ?Joelene Millin R Rahl ?October 24, 1991  ?[id number].   ? ?Requesting MD: Armandina Gemma, MD ?Chief Complaint/Reason for Consult: possible cholecystitis  ? ?HPI:  ?Patient is a 31 year old female who presented to the ED with abdominal pain that started Saturday around 5 PM with associated nausea and vomiting. Reports generalized R sided abd pain that did not get better with tylenol and continued to worsen on Sunday. States emesis today seemed to be a little bloody. Poor PO intake secondary to above. Associated chills, fever, and dark urine as well. She was drinking alcohol over the weekend with her sister, also smoked marijuana. Denies regulard alcohol, tobacco, or marijuana use.  ?  ?PMH otherwise significant for obesity, asthma, GERD, hx of hiatal hernia. Prior abdominal surgery includes cesarean section x2. Allergies listed to NSAIDs and intolerance to morphine. No blood thinners at home.  ? ?ROS: As above ?Review of Systems  ?All other systems reviewed and are negative. ? ?Family History  ?Problem Relation Age of Onset  ? Diabetes Mother   ? Hypertension Mother   ? Obesity Mother   ? Heart attack Mother 63  ?     Death  ? Obesity Sister   ? Osteochondroma Sister   ? Diabetes Sister   ? Hypertension Sister   ? ? ?Past Medical History:  ?Diagnosis Date  ? Abdominal pain 01/06/2012  ? Acute tension headache 07/27/2019  ? Anemia   ? Ankle pain   ? Asthma   ? Birth control counseling 08/26/2011  ? BMI 40.0-44.9, adult ([place]) 02/27/2011  ? GERD (gastroesophageal reflux disease)   ? Hiatal hernia   ? Obesity (BMI 30-39.9)   ? Secondary amenorrhea 03/25/2018  ? ? ?Past Surgical History:  ?Procedure Laterality Date  ? CESAREAN SECTION  09/02/2010  ? For macrosomia, but son was 7 lb 12 oz  ? CESAREAN SECTION N/A 10/06/2021  ? Procedure: CESAREAN SECTION;  Surgeon: Aletha Halim, MD;  Location: [facility] LD ORS;  Service: Obstetrics;  Laterality: N/A;  ? TONSILLECTOMY AND ADENOIDECTOMY    ? ? ?Social History:  reports that she has never smoked.  She has never used smokeless tobacco. She reports that she does not currently use drugs. She reports that she does not drink alcohol. ? ?Allergies:  ?Allergies  ?Allergen Reactions  ? Morphine And Related Other (See Comments)  ?  Causes "chest to be heavy"  ? Motrin [Ibuprofen]   ?  Was told to not take this because it would flare stomach issues  ? ? ?(Not in a hospital admission) ? ? ? ?Physical Exam: ?Blood pressure 99/88, pulse (!) 105, temperature (!) 102.3 ?F (39.1 ?C), temperature source Rectal, resp. rate (!) 22, last menstrual period 02/19/2022, SpO2 99 %, not currently breastfeeding. ?General: pleasant, WD, black female who is in NAD. ?HEENT: head is normocephalic, atraumatic. Sclera are noninjected and anicteric .  PERRL.  Ears and nose without any masses or lesions.  Mouth is pink and moist ?Heart: sinus tachycardia HR 120's, Normal s1,s2. No obvious murmurs, gallops, or rubs noted.  Palpable radial and pedal pulses bilaterally ?Lungs: CTAB, no wheezes, rhonchi, or rales noted.  Respiratory effort nonlabored ?Abd: soft, TTP RUQ with guarding, no peritonitis, nontender lower abdomen and LUQ, minimal R flank tenderness ?MS: all 4 extremities are symmetrical with no cyanosis, clubbing, or edema. ?Skin: warm and dry with no masses, lesions, or rashes ?Neuro: Cranial nerves 2-12 grossly intact, sensation is normal throughout ?Psych: A&Ox3 with an appropriate affect. ? ?Results for  orders placed or performed during the hospital encounter of 03/10/22 (from the past 48 hour(s))  ?Lipase, blood     Status: None  ? Collection Time: 03/10/22  8:30 AM  ?Result Value Ref Range  ? Lipase 21 11 - 51 U/L  ?  Comment: Performed at [facility], [place] [address], [place], [place] [address]  ?CBC with Diff     Status: Abnormal  ? Collection Time: 03/10/22  8:30 AM  ?Result Value Ref Range  ? WBC 30.5 (H) 4.0 - 10.5 K/uL  ? RBC 3.96 3.87 - 5.11 MIL/uL  ? Hemoglobin 10.7 (L) 12.0 - 15.0 g/dL  ? HCT 32.0 (L) 36.0 - 46.0 %   ? MCV 80.8 80.0 - 100.0 fL  ? MCH 27.0 26.0 - 34.0 pg  ? MCHC 33.4 30.0 - 36.0 g/dL  ? RDW 14.8 11.5 - 15.5 %  ? Platelets 202 150 - 400 K/uL  ? nRBC 0.0 0.0 - 0.2 %  ? Neutrophils Relative % 91 %  ? Neutro Abs 27.8 (H) 1.7 - 7.7 K/uL  ? Lymphocytes Relative 6 %  ? Lymphs Abs 1.8 0.7 - 4.0 K/uL  ? Monocytes Relative 3 %  ? Monocytes Absolute 0.9 0.1 - 1.0 K/uL  ? Eosinophils Relative 0 %  ? Eosinophils Absolute 0.0 0.0 - 0.5 K/uL  ? Basophils Relative 0 %  ? Basophils Absolute 0.0 0.0 - 0.1 K/uL  ? nRBC 0 0 /100 WBC  ? Abs Immature Granulocytes 0.00 0.00 - 0.07 K/uL  ?  Comment: Performed at [facility], [place] [address], [place], [place] [address]  ?Basic metabolic panel     Status: Abnormal  ? Collection Time: 03/10/22  8:30 AM  ?Result Value Ref Range  ? Sodium 133 (L) 135 - 145 mmol/L  ? Potassium 3.1 (L) 3.5 - 5.1 mmol/L  ? Chloride 102 98 - 111 mmol/L  ? CO2 24 22 - 32 mmol/L  ? Glucose, Bld 142 (H) 70 - 99 mg/dL  ?  Comment: Glucose reference range applies only to samples taken after fasting for at least 8 hours.  ? BUN 10 6 - 20 mg/dL  ? Creatinine, Ser 1.58 (H) 0.44 - 1.00 mg/dL  ? Calcium 8.5 (L) 8.9 - 10.3 mg/dL  ? GFR, Estimated 45 (L) >60 mL/min  ?  Comment: (NOTE) ?Calculated using the CKD-EPI Creatinine Equation (2021) ?  ? Anion gap 7 5 - 15  ?  Comment: Performed at [facility], [place] [address], [place], [place] [address]  ?Hepatic function panel     Status: Abnormal  ? Collection Time: 03/10/22  8:30 AM  ?Result Value Ref Range  ? Total Protein 6.4 (L) 6.5 - 8.1 g/dL  ? Albumin 2.7 (L) 3.5 - 5.0 g/dL  ? AST 11 (L) 15 - 41 U/L  ? ALT 16 0 - 44 U/L  ? Alkaline Phosphatase 76 38 - 126 U/L  ? Total Bilirubin 0.7 0.3 - 1.2 mg/dL  ? Bilirubin, Direct 0.2 0.0 - 0.2 mg/dL  ? Indirect Bilirubin 0.5 0.3 - 0.9 mg/dL  ?  Comment: Performed at [facility], [place] [address] [place], [place] [address]  ?Lactic acid, plasma     Status: None  ? Collection Time: 03/10/22  8:30 AM  ?Result Value Ref  Range  ? Lactic Acid, Venous 1.6 0.5 - 1.9 mmol/L  ?  Comment: Performed at [facility], [place],  [place] [address]  ?I-Stat beta hCG blood, ED     Status: None  ? Collection Time: 03/10/22  8:38 AM  ?Result Value Ref Range  ? I-stat hCG, quantitative <5.0 <5 mIU/mL  ? Comment 3          ?  Comment:   GEST. AGE      CONC.  (mIU/mL) ?  <=1 WEEK        5 - 50 ?    2 WEEKS       50 - 500 ?    3 WEEKS       100 - 10,000 ?    4 WEEKS     1,000 - 30,000 ?       ?FEMALE AND NON-PREGNANT FEMALE: ?    LESS THAN 5 mIU/mL ?  ?I-Stat Chem 8, ED     Status: Abnormal  ? Collection Time: 03/10/22  8:40 AM  ?Result Value Ref Range  ? Sodium 135 135 - 145 mmol/L  ? Potassium 3.2 (L) 3.5 - 5.1 mmol/L  ? Chloride 98 98 - 111 mmol/L  ? BUN 10 6 - 20 mg/dL  ? Creatinine, Ser 1.50 (H) 0.44 - 1.00 mg/dL  ? Glucose, Bld 140 (H) 70 - 99 mg/dL  ?  Comment: Glucose reference range applies only to samples taken after fasting for at least 8 hours.  ? Calcium, Ion 1.10 (L) 1.15 - 1.40 mmol/L  ? TCO2 26 22 - 32 mmol/L  ? Hemoglobin 11.6 (L) 12.0 - 15.0 g/dL  ? HCT 34.0 (L) 36.0 - 46.0 %  ?Urinalysis, Routine w reflex microscopic Urine, Clean Catch     Status: Abnormal  ? Collection Time: 03/10/22 10:11 AM  ?Result Value Ref Range  ? Color, Urine YELLOW YELLOW  ? APPearance HAZY (A) CLEAR  ? Specific Gravity, Urine 1.006 1.005 - 1.030  ? pH 6.0 5.0 - 8.0  ? Glucose, UA NEGATIVE NEGATIVE mg/dL  ? Hgb urine dipstick MODERATE (A) NEGATIVE  ? Bilirubin Urine NEGATIVE NEGATIVE  ? Ketones, ur NEGATIVE NEGATIVE mg/dL  ? Protein, ur 100 (A) NEGATIVE mg/dL  ? Nitrite NEGATIVE NEGATIVE  ? Leukocytes,Ua LARGE (A) NEGATIVE  ? RBC / HPF 6-10 0 - 5 RBC/hpf  ? WBC, UA >50 (H) 0 - 5 WBC/hpf  ? Bacteria, UA RARE (A) NONE SEEN  ? Squamous Epithelial / LPF 0-5 0 - 5  ? WBC Clumps PRESENT   ?  Comment: Performed at [facility], [place] [address], [place], [place] [address]  ? ?CT ABDOMEN PELVIS W CONTRAST ? ?Result Date: 03/10/2022 ?CLINICAL  DATA:  "Acute abdominal pain".  Right upper quadrant pain. EXAM: CT ABDOMEN AND PELVIS WITH CONTRAST TECHNIQUE: Multidetector CT imaging of the abdomen and pelvis was performed using the standard proto

## 2022-03-10 NOTE — ED Triage Notes (Signed)
Pt reported to [facility] c/o RUQ abdominal pain and vomiting x3 days.Pt also endorses headache and lower backache. Denies any chest pain or urinary pain.  ?

## 2022-03-10 NOTE — Progress Notes (Addendum)
FPTS Interim Progress Note ? ?S:Went to see patient with Dr. Robyne Peers. Patient resting in bed, noting that she has some back pain. Describes her back pain as severe. Otherwise doing fine, and feeling better than earlier.  ? ?O: ?BP 102/69   Pulse (!) 116   Temp (!) 101.3 ?F (38.5 ?C) (Oral)   Resp (!) 29   LMP 02/19/2022 (Approximate)   SpO2 97%   ?General: non toxic, resting comfortably, NAD, Afrincan american woman ?Chest: CTABL, no focal findings, no respiratory distress, normal work of breathing ?Card: Mildly tachycardic, regular rhythm, NRMG ? ?A/P: ?Pain secondary to pyelonephritis ?Patient last oxy dose was at 2 pm, called nurse to have overdue dose of oxy given. Continue to monitor vitals, seem's to be improving. Telemetry reviewed and looked appropriate. ?-Continue zosyn ?-Oxycodone for pain ?-Continue to monitor vitals ?-Plan per day team ? ?Bess Kinds, MD ?03/10/2022, 8:21 PM ?PGY-1, [facility] ?Service pager [phone] ? ?

## 2022-03-11 ENCOUNTER — Encounter (HOSPITAL_COMMUNITY): Payer: Self-pay | Admitting: Family Medicine

## 2022-03-11 ENCOUNTER — Other Ambulatory Visit: Payer: Self-pay

## 2022-03-11 DIAGNOSIS — Z20822 Contact with and (suspected) exposure to covid-19: Secondary | ICD-10-CM | POA: Diagnosis not present

## 2022-03-11 DIAGNOSIS — K8020 Calculus of gallbladder without cholecystitis without obstruction: Secondary | ICD-10-CM | POA: Diagnosis not present

## 2022-03-11 DIAGNOSIS — Z8249 Family history of ischemic heart disease and other diseases of the circulatory system: Secondary | ICD-10-CM | POA: Diagnosis not present

## 2022-03-11 DIAGNOSIS — R0602 Shortness of breath: Secondary | ICD-10-CM | POA: Diagnosis not present

## 2022-03-11 DIAGNOSIS — N12 Tubulo-interstitial nephritis, not specified as acute or chronic: Secondary | ICD-10-CM | POA: Diagnosis not present

## 2022-03-11 DIAGNOSIS — K219 Gastro-esophageal reflux disease without esophagitis: Secondary | ICD-10-CM | POA: Diagnosis not present

## 2022-03-11 DIAGNOSIS — E038 Other specified hypothyroidism: Secondary | ICD-10-CM | POA: Diagnosis not present

## 2022-03-11 DIAGNOSIS — A4151 Sepsis due to Escherichia coli [E. coli]: Secondary | ICD-10-CM | POA: Diagnosis not present

## 2022-03-11 DIAGNOSIS — R7303 Prediabetes: Secondary | ICD-10-CM | POA: Diagnosis not present

## 2022-03-11 DIAGNOSIS — Z833 Family history of diabetes mellitus: Secondary | ICD-10-CM | POA: Diagnosis not present

## 2022-03-11 DIAGNOSIS — Z6841 Body Mass Index (BMI) 40.0 and over, adult: Secondary | ICD-10-CM | POA: Diagnosis not present

## 2022-03-11 DIAGNOSIS — A419 Sepsis, unspecified organism: Secondary | ICD-10-CM | POA: Diagnosis not present

## 2022-03-11 DIAGNOSIS — F411 Generalized anxiety disorder: Secondary | ICD-10-CM | POA: Diagnosis not present

## 2022-03-11 DIAGNOSIS — Z79899 Other long term (current) drug therapy: Secondary | ICD-10-CM | POA: Diagnosis not present

## 2022-03-11 DIAGNOSIS — Z87891 Personal history of nicotine dependence: Secondary | ICD-10-CM | POA: Diagnosis not present

## 2022-03-11 DIAGNOSIS — N179 Acute kidney failure, unspecified: Secondary | ICD-10-CM | POA: Diagnosis not present

## 2022-03-11 DIAGNOSIS — E669 Obesity, unspecified: Secondary | ICD-10-CM | POA: Diagnosis not present

## 2022-03-11 DIAGNOSIS — K449 Diaphragmatic hernia without obstruction or gangrene: Secondary | ICD-10-CM | POA: Diagnosis present

## 2022-03-11 DIAGNOSIS — R1011 Right upper quadrant pain: Secondary | ICD-10-CM | POA: Diagnosis not present

## 2022-03-11 DIAGNOSIS — J4520 Mild intermittent asthma, uncomplicated: Secondary | ICD-10-CM | POA: Diagnosis not present

## 2022-03-11 DIAGNOSIS — D509 Iron deficiency anemia, unspecified: Secondary | ICD-10-CM | POA: Diagnosis not present

## 2022-03-11 DIAGNOSIS — R000 Tachycardia, unspecified: Secondary | ICD-10-CM | POA: Diagnosis not present

## 2022-03-11 DIAGNOSIS — R109 Unspecified abdominal pain: Secondary | ICD-10-CM | POA: Diagnosis not present

## 2022-03-11 DIAGNOSIS — E876 Hypokalemia: Secondary | ICD-10-CM | POA: Diagnosis not present

## 2022-03-11 LAB — CBC
HCT: 30 % — ABNORMAL LOW (ref 36.0–46.0)
Hemoglobin: 10.1 g/dL — ABNORMAL LOW (ref 12.0–15.0)
MCH: 27.7 pg (ref 26.0–34.0)
MCHC: 33.7 g/dL (ref 30.0–36.0)
MCV: 82.4 fL (ref 80.0–100.0)
Platelets: 141 10*3/uL — ABNORMAL LOW (ref 150–400)
RBC: 3.64 MIL/uL — ABNORMAL LOW (ref 3.87–5.11)
RDW: 14.9 % (ref 11.5–15.5)
WBC: 26.9 10*3/uL — ABNORMAL HIGH (ref 4.0–10.5)
nRBC: 0 % (ref 0.0–0.2)

## 2022-03-11 LAB — BASIC METABOLIC PANEL
Anion gap: 8 (ref 5–15)
BUN: 10 mg/dL (ref 6–20)
CO2: 21 mmol/L — ABNORMAL LOW (ref 22–32)
Calcium: 7.5 mg/dL — ABNORMAL LOW (ref 8.9–10.3)
Chloride: 105 mmol/L (ref 98–111)
Creatinine, Ser: 1.75 mg/dL — ABNORMAL HIGH (ref 0.44–1.00)
GFR, Estimated: 40 mL/min — ABNORMAL LOW (ref >60)
Glucose, Bld: 117 mg/dL — ABNORMAL HIGH (ref 70–99)
Potassium: 4.2 mmol/L (ref 3.5–5.1)
Sodium: 134 mmol/L — ABNORMAL LOW (ref 135–145)

## 2022-03-11 LAB — URINE CULTURE

## 2022-03-11 MED ORDER — SENNOSIDES-DOCUSATE SODIUM 8.6-50 MG PO TABS
1.0000 | ORAL_TABLET | Freq: Every day | ORAL | Status: DC
  Administered 2022-03-11 – 2022-03-14 (×4): 1 via ORAL
  Filled 2022-03-11 (×4): qty 1

## 2022-03-11 MED ORDER — PANTOPRAZOLE SODIUM 20 MG PO TBEC
20.0000 mg | DELAYED_RELEASE_TABLET | Freq: Two times a day (BID) | ORAL | Status: DC
  Administered 2022-03-11 – 2022-03-14 (×7): 20 mg via ORAL
  Filled 2022-03-11 (×8): qty 1

## 2022-03-11 MED ORDER — SODIUM CHLORIDE 0.9 % IV BOLUS
1000.0000 mL | Freq: Once | INTRAVENOUS | Status: AC
  Administered 2022-03-11: 1000 mL via INTRAVENOUS

## 2022-03-11 MED ORDER — CEFTRIAXONE SODIUM 2 G IJ SOLR
2.0000 g | INTRAMUSCULAR | Status: DC
  Administered 2022-03-11 – 2022-03-12 (×2): 2 g via INTRAVENOUS
  Filled 2022-03-11 (×2): qty 20

## 2022-03-11 MED ORDER — METRONIDAZOLE 500 MG/100ML IV SOLN
500.0000 mg | Freq: Two times a day (BID) | INTRAVENOUS | Status: DC
  Administered 2022-03-12 – 2022-03-13 (×3): 500 mg via INTRAVENOUS
  Filled 2022-03-11 (×3): qty 100

## 2022-03-11 MED ORDER — SODIUM CHLORIDE 0.9 % IV SOLN: 2.0000 g | INTRAVENOUS | Status: DC

## 2022-03-11 MED ORDER — PANTOPRAZOLE SODIUM 40 MG PO TBEC
40.0000 mg | DELAYED_RELEASE_TABLET | Freq: Once | ORAL | Status: AC
  Administered 2022-03-11: 40 mg via ORAL
  Filled 2022-03-11: qty 1

## 2022-03-11 MED ORDER — POLYETHYLENE GLYCOL 3350 17 G PO PACK
17.0000 g | PACK | Freq: Two times a day (BID) | ORAL | Status: DC
  Administered 2022-03-11 – 2022-03-12 (×3): 17 g via ORAL
  Filled 2022-03-11 (×5): qty 1

## 2022-03-11 MED ORDER — PHENOL 1.4 % MT LIQD
1.0000 | OROMUCOSAL | Status: DC | PRN
  Filled 2022-03-11: qty 177

## 2022-03-11 MED ORDER — POLYETHYLENE GLYCOL 3350 17 G PO PACK: 17.0000 g | PACK | Freq: Two times a day (BID) | ORAL | Status: DC

## 2022-03-11 MED ORDER — GUAIFENESIN 100 MG/5ML PO LIQD
5.0000 mL | ORAL | Status: DC | PRN
  Administered 2022-03-12 – 2022-03-13 (×2): 5 mL via ORAL
  Filled 2022-03-11 (×2): qty 5

## 2022-03-11 MED ORDER — METRONIDAZOLE IVPB CUSTOM
1000.0000 mg | Freq: Once | INTRAVENOUS | Status: AC
  Administered 2022-03-11: 1000 mg via INTRAVENOUS
  Filled 2022-03-11: qty 200

## 2022-03-11 NOTE — ED Notes (Signed)
Pt up to [facility], MD at bedside. Pt tachycardic in 140s, ST, after getting up to [facility]. Pt resting comfortably at this time, HR slowly returning to previous rate. Pt breathing comfortably on room air.  ?

## 2022-03-11 NOTE — Progress Notes (Signed)
?  Transition of Care (TOC) Screening Note ? ? ?Patient Details  ?Name: Veronica [name] ?Date of Birth: December 26, 1990 ? ? ?Transition of Care (TOC) CM/SW Contact:    ?Mearl Latin, LCSW ?Phone Number: ?03/11/2022, 4:09 PM ? ? ? ?Transition of Care Department [facility]) has reviewed patient and no TOC needs have been identified at this time. We will continue to monitor patient advancement through interdisciplinary progression rounds. If new patient transition needs arise, please place a TOC consult. ? ? ?

## 2022-03-11 NOTE — Progress Notes (Signed)
FPTS Brief Progress Note ? ?S:Went to see patient after receiving page that she had fevered and was having symptoms of GERD. Patient was lying comfortably in bed, with good mentation, complaining of headache and GERD symptoms. Also talked to patient about fever and that I thought we had appropriate antibiotic coverage. She noted they had just given her tylenol and had not yet given her something for the GERD. ? ? ?O: ?BP 118/78   Pulse (!) 113   Temp (!) 103 ?F (39.4 ?C) (Oral)   Resp (!) 23   LMP 02/19/2022 (Approximate)   SpO2 97%   ?General: NAD, laying comfortably, conversational ?Resp: Comfortable work of breathing ?Card: tachycardic ? ?A/P: ?Fever / HA ?Patient appears to be febrile from pyelonephritic infection, thus will treat fever. I expect that patient's can fever within first 24 hours of starting abx, thus no concern to change abx at this time. Also noted that Ucx showed ecoli and pharmacy was recommending narrowing of abx with CFTX. Will continue Zosyn for now and consider narrowing/broadening in the am per day team. ?- Tylenol 650 mg q6h for fever ? ?GERD ?Patient complaining of symptoms of GERD with history of GERD. Patient prescribed home med for Tx ?- Protonix 40 mg x1  ? ? ?Bess Kinds, MD ?03/11/2022, 3:57 AM ?PGY-1, [facility] Night Resident  ?Please page [phone] with questions.  ? ? ?

## 2022-03-11 NOTE — Progress Notes (Signed)
?   03/11/22 1604  ?Assess: MEWS Score  ?Temp (!) 102.7 ?F (39.3 ?C)  ?BP 108/64  ?Pulse Rate (!) 113  ?ECG Heart Rate (!) 117  ?Resp 20  ?SpO2 96 %  ?Assess: MEWS Score  ?MEWS Temp 2  ?MEWS Systolic 0  ?MEWS Pulse 2  ?MEWS RR 0  ?MEWS LOC 0  ?MEWS Score 4  ?MEWS Score Color Red  ?Assess: if the MEWS score is Yellow or Red  ?Were vital signs taken at a resting state? Yes  ?Focused Assessment No change from prior assessment  ?Early Detection of Sepsis Score *See Row Information* Medium  ?MEWS guidelines implemented *See Row Information* No, previously red, continue vital signs every 4 hours  ?Treat  ?MEWS Interventions Escalated (See documentation below)  ?Escalate  ?MEWS: Escalate Red: discuss with charge nurse/RN and provider, consider discussing with RRT  ?Notify: Charge Nurse/RN  ?Name of Charge Nurse/RN Notified desiray  ?Date Charge Nurse/RN Notified 03/11/22  ?Time Charge Nurse/RN Notified 1605  ?Notify: Provider  ?Provider Name/Title Zetta Bills  ?Date Provider Notified 03/11/22  ?Time Provider Notified 1610  ?Notification Type Call  ?Provider response En route  ?Date of Provider Response 03/11/22  ?Time of Provider Response 1700  ?Document  ?Patient Outcome Not stable and remains on department  ? ? ?

## 2022-03-11 NOTE — ED Notes (Signed)
Pt stood up to use bathroom and I noted her HR at 155. I checked on her and she was very warm to touch.  I assessed an oral temp of 103 and gave tylenol. Pt is asking for something for acid reflux. Will notify admitting.  ?

## 2022-03-11 NOTE — Progress Notes (Signed)
FPTS Interim Progress Note ? ?S:Went to bedside to assess patient alongside Dr. Barbaraann Faster given slightly worsening MEWS. Patient resting comfortably, did not awaken her. No concerns per patient. ? ?O: ?BP (!) 92/59 (BP Location: Right Arm)   Pulse (!) 113   Temp (!) 101.2 ?F (38.4 ?C) (Oral)   Resp (!) 29   LMP 02/19/2022 (Approximate)   SpO2 92%   ?General: Patient sleeping comfortably, in no acute distress. ?Resp: normal work of breathing on 2L O2 ? ? ?A/P: ?-Pyelonephritis: continue zosyn with plan to possibly narrow to ceftriaxone later today per day team given cultures returned with E coli. Vitals and telemetry reviewed, vitals improving since admission and telemetry appropriate. Does not appear to demonstrate tachypnea on exam despite vitals. New oxygen requirement likely secondary to during sleep state, plan to wean as tolerated.  ?-AKI: Cr worsened from 1.5 to 1.75 most recently. Continue IVF and limit nephrotoxic agents. ?-Remainder of plan per day team  ? ?Reece Leader, DO ?03/11/2022, 5:53 AM ?PGY-2, [facility] ?Service pager [phone]  ?

## 2022-03-11 NOTE — Progress Notes (Signed)
FPTS Brief Progress Note ? ?S:Went to see patient with Dr. Robyne Peers. Patient resting in bed with no complaints of pain. Talked with patient about infection, abx, and possibly getting imaging in am. ? ? ?O: ?BP 114/73 (BP Location: Right Arm)   Pulse (!) 108   Temp (!) 101.6 ?F (38.7 ?C) (Oral)   Resp (!) 33   Ht 5\' 2"  (1.575 m)   Wt 115.7 kg   LMP 02/19/2022 (Approximate)   SpO2 97%   BMI 46.65 kg/m?   ?General: NAD, resting comfortably,  ?Resp: CTABL, non-tachypneic ?Card: Distant heart sounds, mildly tachycardic ?Abdomen: TTP in upper abdomen  ? ?A/P: ?Patient appears improved clinically as compared to yesterday. Vitals are improved with lower pulses. She continues to fever and will get tylenol as needed. ?- Orders reviewed. Labs for AM ordered, which was adjusted as needed.  ?- Plans per day team ? ?02/21/2022, MD ?03/11/2022, 8:28 PM ?PGY-1, [facility] Night Resident  ?Please page [phone] with questions.  ? ? ?

## 2022-03-11 NOTE — Progress Notes (Signed)
Interim Progress Note ? ?In to see patient after receiving message from RN regarding MEWS change to RED. See vitals below.  ? ?S: Patient reports that she is having "stomach pain". She does not had much to eat today except some crackers. She denies any nausea or vomiting. She doesn't feel that she has passed gas and has not had any bowel movements since admission.  ? ?O:  ?Blood pressure 108/64, pulse (!) 113, temperature (!) 102.7 ?F (39.3 ?C), temperature source Oral, resp. rate 20, height 5\' 2"  (1.575 m), weight 115.7 kg, last menstrual period 02/19/2022, SpO2 96 %, not currently breastfeeding. ?Gen: African american female laying on her left-side in bed, in no acute distress, pleasant, cooperative with examination  ?Resp: Breathing comfortably ?Cardio: Tachycardic, rate 110-120s, no murmur ?Abd: Soft, hypoactive bowel sounds, no LLQ ttp but does have some RUQ, RLQ, central and LUQ ttp without R/G ? ? ?A/P: ?Abdominal Pain ?Could be related to possible ileus vs constipation, doubt SBO at this time given tolerance to PO despite decreased appetite and no N/V. If persists and worse in RUQ could consider additional imaging with MRCP to more closely examine gallbladder. Abdominal exam without signs of peritonitis. She has been receiving opioid pain medication and has been refusing her MiraLAX the last two days. Discussed options for imaging vs trial of MiraLAX and senna. Patient prefers to defer imaging for now and trial MiraLAX and senna. Discussed to let the RN/our team know if she has any changes in her pain.  ?-MiraLAX 17 g BID  ?-Add Senna ?-Monitor for clinical changes ?-Consider imaging with abdominal series vs CT vs MRCP if clinical changes  ? ?Pyelonephritis  ?Abx switched to CTX. Patient still having fevers which can still occur up to 48-72 hours. Reassuringly she appears non-toxic despite her vitals.  ?-Continue current plan with CTX; will broaden again if worsening clinically  ?-Pain control with tylenol  and oxy PRN  ? ?Intermittent Desaturations ?Intermittent desaturations while in room. This is likely due to pulse ox being placed on finger with nailpolish which can interfere with readings. Would recommend placement elsewhere. She denies any SOB. When there was good wave-form present her saturations were 97% and greater.  ? ?

## 2022-03-11 NOTE — Progress Notes (Signed)
Family Medicine Teaching Service ?Daily Progress Note ?Intern Pager: [phone] ? ?Patient name: Veronica LaughterKimberly R Panella Medical record number: [id number] ?Date of birth: 1991-02-03 Age: 31 y.o. Gender: female ? ?Primary Care Provider: Cora CollumPaige, Victoria J, DO ?Consultants: General surgery (s/o) ?Code Status: Full ? ?Pt Overview and Major Events to Date:  ?4/3- admitted ? ?Assessment and Plan: ?Veronica [name] is a 31 y.o. female presenting with abdominal pain and fever. PMH is significant for history of UTIs, asthma, GERD, seasonal allergies, anxiety, anemia ? ?Pyelonephritis  E Coli bacteremia  Sepsis ?Patient has remained persistently febrile overnight, Tmax 103. Most recently 101.2 despite receiving Tylenol.  Has also been persistently tachycardic to the 110s overnight. Patient had a desat late in the evening that required her to be placed on 2L Pax but is back to RA this am. . WBC this am 30.5>26.9. BCID panel with E Coli, susceptibilities pending.  ?- Transition Zosyn to Ceftriaxone 2g q24h ?- Continue Tylenol PRN for fever ?- Oxycodone PRN for pain ?- F/u susceptibilities ? ?AKI ?Slightly worse today, Cr 1.50>1.75, baseline 1.0. Likely a combination of pre-renal and intrinsic kidney injury in the setting of pyelonephritis. Currently receiving IVF at maintenance rate. Consider, however, that she likely has increased insensible losses given her acute illness. BP is soft (92/59) thought MAP intact. ?- Will re-bolus 1L NS ?- Continue mIVF ?- Continue to monitor BMP for evidence of acute renal failure ? ?Hypertension ?Previously on nifedipine, however, BP has been low as discussed above. ?- Monitor, if needs meds, will switch agents  ? ?Hypokalemia, resolved ?K 4.2 this am.  ?- Follow on BMP ? ?GERD ?Felt that she got good relief of symptoms with one-time Protonix dose overnight. ?- Re-start home protonix 20 mg twice daily. ? ?Anxiety ?- Continue home Lexapro 10mg  daily ?- Hydroxyzine 25mg  TID PRN ? ?Iron deficiency anemia,  chronic ?Hgb 10.7>10.1. ?- Holding home iron supplementation ? ?Allergic rhinitis ?- Continue home Flonase ? ?FEN/GI: Regular diet ?PPx: Lovenox ?Dispo: Pending clinical improvement  . Barriers include further evaluation and treatment.  ? ?Subjective:  ?Ms. Miner reports feeling generally well this morning.  Her only complaint at this time is a slight headache and some upper back pain which she attributes to the ED stretcher.  She reports ongoing right-sided abdominal/flank pain that she says is well controlled with the oxycodone that she is receiving now.  She does feel like she is getting adequate analgesia and that it is lasting long enough between doses. ? ?Objective: ?Temp:  [100.2 ?F (37.9 ?C)-103 ?F (39.4 ?C)] 101.2 ?F (38.4 ?C) (04/04 0525) ?Pulse Rate:  [105-145] 113 (04/04 0525) ?Resp:  [17-30] 29 (04/04 0525) ?BP: (90-118)/(51-88) 92/59 (04/04 0525) ?SpO2:  [88 %-100 %] 92 % (04/04 0525) ?Physical Exam: ?General: Awake, uncomfortable appearing but NAD.  Quite sweaty on her back. ?Cardiovascular: Tachycardic, without murmur ?Respiratory: Lungs are clear bilaterally, O2 turned off during interview, normal work of breathing on room air ?Abdomen: Diffuse abdominal pain, worse on right than left.  Right-sided flank pain. ?Extremities: Without edema or deformity ? ?Laboratory: ?Recent Labs  ?Lab 03/10/22 ?0830 03/10/22 ?0840 03/11/22 ?0255  ?WBC 30.5*  --  26.9*  ?HGB 10.7* 11.6* 10.1*  ?HCT 32.0* 34.0* 30.0*  ?PLT 202  --  141*  ? ?Recent Labs  ?Lab 03/10/22 ?0830 03/10/22 ?0840 03/11/22 ?0255  ?NA 133* 135 134*  ?K 3.1* 3.2* 4.2  ?CL 102 98 105  ?CO2 24  --  21*  ?BUN 10 10 10   ?  CREATININE 1.58* 1.50* 1.75*  ?CALCIUM 8.5*  --  7.5*  ?PROT 6.4*  --   --   ?BILITOT 0.7  --   --   ?ALKPHOS 76  --   --   ?ALT 16  --   --   ?AST 11*  --   --   ?GLUCOSE 142* 140* 117*  ? ? ?Blood Culture ?Positive for gram-negative rods.  BC ID identified species as E. coli.  Awaiting sensitivities. ? ?Imaging/Diagnostic Tests: ?CT  ABDOMEN PELVIS W CONTRAST ?CLINICAL DATA:  "Acute abdominal pain".  Right upper quadrant pain. ? ?EXAM: ?CT ABDOMEN AND PELVIS WITH CONTRAST ? ?TECHNIQUE: ?Multidetector CT imaging of the abdomen and pelvis was performed ?using the standard protocol following bolus administration of ?intravenous contrast. ? ?RADIATION DOSE REDUCTION: This exam was performed according to the ?departmental dose-optimization program which includes automated ?exposure control, adjustment of the mA and/or kV according to ?patient size and/or use of iterative reconstruction technique. ? ?CONTRAST:  OMNIPAQUE IOHEXOL 300 MG/ML  SOLN ? ?COMPARISON:  Today's ultrasound of the right upper quadrant, ?dictated separately. No comparison CT. ? ?FINDINGS: ?Lower chest: Clear lung bases. Mild cardiomegaly, without ?pericardial or pleural effusion. ? ?Hepatobiliary: Hepatomegaly at 20.9 cm craniocaudal. No focal liver ?lesion. No calcified gallstones. No significant pericholecystic ?edema. ? ?Pancreas: Normal, without mass or ductal dilatation. ? ?Spleen: Normal in size, without focal abnormality. ? ?Adrenals/Urinary Tract: Normal adrenal glands. There is moderately ?heterogeneous right renal enhancement with perirenal edema. The ?heterogeneous enhancement is most apparent on delayed images, ?including 15/6. ? ?No hydronephrosis. The right ureter is minimally prominent with ?enhancement along its proximal course. It is difficult to follow ?distally. Calcifications within the pelvis are favored to be extra ?ureteric. There are 2 small calcifications in the region of the ?distal right ureter including on 71/3. Normal urinary bladder. ? ?Stomach/Bowel: Normal stomach, without wall thickening. Normal ?colon, appendix, and terminal ileum. Normal small bowel. ? ?Vascular/Lymphatic: Normal caliber of the aorta and branch vessels. ?No abdominopelvic adenopathy. ? ?Reproductive: Normal uterus.  No adnexal mass. ? ?Other: No significant free fluid.  No  free intraperitoneal air. ? ?Musculoskeletal: No acute osseous abnormality. ? ?IMPRESSION: ?1. Heterogeneous right renal enhancement and surrounding edema, most ?consistent with pyelonephritis. Favor secondary minimal proximal ?right ureteric prominence secondary to ascending infection. Pelvic ?calcifications are felt to be vascular. Correlate with urinalysis to ?exclude unlikely concurrent distal right ureteric stone. ?2. Despite the ultrasound findings, no convincing CT evidence of ?acute cholecystitis. ?3. Hepatomegaly. ? ?Electronically Signed ?  By: Jeronimo Greaves M.D. ?  On: 03/10/2022 10:58 ?US Abdomen Limited RUQ (LIVER/GB) ?CLINICAL DATA:  Right upper quadrant pain. ? ?EXAM: ?ULTRASOUND ABDOMEN LIMITED RIGHT UPPER QUADRANT ? ?COMPARISON:  10/20/2017. ? ?FINDINGS: ?Gallbladder: ? ?Multiple shadowing echogenic stones measure up to approximately 1.3 ?cm. Slight wall thickening, 4 mm. Positive sonographic Murphy sign. ? ?Common bile duct: ? ?Diameter: 4 mm, within normal limits. No intrahepatic biliary ductal ?dilatation. ? ?Liver: ? ?No focal lesion identified. Within normal limits in parenchymal ?echogenicity. Portal vein is patent on color Doppler imaging with ?normal direction of blood flow towards the liver. ? ?Other: None. ? ?IMPRESSION: ?Cholelithiasis with borderline wall thickening and positive ?sonographic Murphy sign, findings indicative of acute cholecystitis. ? ?Electronically Signed ?  By: Leanna Battles M.D. ?  On: 03/10/2022 08:20 ?DG Chest Portable 1 View ?CLINICAL DATA:  Shortness of breath. ? ?EXAM: ?PORTABLE CHEST 1 VIEW ? ?COMPARISON:  October 20, 2021. ? ?FINDINGS: ?The heart size and  mediastinal contours are within normal limits. ?Both lungs are clear. The visualized skeletal structures are ?unremarkable. ? ?IMPRESSION: ?No active disease. ? ?Electronically Signed ?  By: Lupita Raider M.D. ?  On: 03/10/2022 08:11 ? ? ? ?Alicia Amel, MD ?03/11/2022, 6:18 AM ?PGY-1, [facility] ?FPTS Intern pager: [phone], text pages welcome ? ?

## 2022-03-12 DIAGNOSIS — A419 Sepsis, unspecified organism: Secondary | ICD-10-CM

## 2022-03-12 DIAGNOSIS — K8020 Calculus of gallbladder without cholecystitis without obstruction: Secondary | ICD-10-CM

## 2022-03-12 LAB — CULTURE, BLOOD (ROUTINE X 2): Special Requests: ADEQUATE

## 2022-03-12 LAB — BASIC METABOLIC PANEL
Anion gap: 8 (ref 5–15)
BUN: 9 mg/dL (ref 6–20)
CO2: 18 mmol/L — ABNORMAL LOW (ref 22–32)
Calcium: 7.3 mg/dL — ABNORMAL LOW (ref 8.9–10.3)
Chloride: 107 mmol/L (ref 98–111)
Creatinine, Ser: 1.35 mg/dL — ABNORMAL HIGH (ref 0.44–1.00)
GFR, Estimated: 55 mL/min — ABNORMAL LOW (ref >60)
Glucose, Bld: 96 mg/dL (ref 70–99)
Potassium: 3.6 mmol/L (ref 3.5–5.1)
Sodium: 133 mmol/L — ABNORMAL LOW (ref 135–145)

## 2022-03-12 LAB — CBC
HCT: 29.1 % — ABNORMAL LOW (ref 36.0–46.0)
Hemoglobin: 9.5 g/dL — ABNORMAL LOW (ref 12.0–15.0)
MCH: 26.3 pg (ref 26.0–34.0)
MCHC: 32.6 g/dL (ref 30.0–36.0)
MCV: 80.6 fL (ref 80.0–100.0)
Platelets: 129 10*3/uL — ABNORMAL LOW (ref 150–400)
RBC: 3.61 MIL/uL — ABNORMAL LOW (ref 3.87–5.11)
RDW: 15 % (ref 11.5–15.5)
WBC: 15.9 10*3/uL — ABNORMAL HIGH (ref 4.0–10.5)
nRBC: 0 % (ref 0.0–0.2)

## 2022-03-12 NOTE — Progress Notes (Addendum)
Family Medicine Teaching Service ?Daily Progress Note ?Intern Pager: [phone] ? ?Patient name: Veronica [name] Jicha Medical record number: [id number] ?Date of birth: [birth year] Age: 31 y.o. Gender: female ? ?Primary Care Provider: Cora Collum, DO ?Consultants: General surgery (s/o) ?Code Status: Full ? ?Pt Overview and Major Events to Date:  ?4/3-admitted ? ?Assessment and Plan: ?Veronica [name] is a 31 y.o. female presenting with abdominal pain and fever. PMH is significant for history of UTIs, asthma, GERD, seasonal allergies, anxiety, anemia ? ?Pyelonephritis  E Coli bacteremia  Sepsis ?Patient defervesced overnight.  Unfortunately, had a repeat fever to 102.7 at 0800 this morning.  Heart rate is also coming down to 105, though increased to 118 with fever this am.  Patient has remained intermittently tachypneic to the mid 20s but is weaned off of nasal cannula and is stable on room air.  WBC continues to downtrend nicely 26.9 > 15.9.  Added Flagyl to ceftriaxone later in the day yesterday.  Culture with E. coli, still awaiting susceptibilities. ?Of note, patient also had a dry cough that started yesterday.  Denied chest pain or shortness of breath.  Got Robitussin and Chloraseptic spray from nursing overnight. ?-Continue ceftriaxone and Flagyl, narrow as able ?-If continues to fever through tomorrow, will repeat abdominal imaging to rule out abscess our other nidus of infection ?-Continue as needed Tylenol for fever ?-Continue as needed oxycodone for pain ?-Follow-up susceptibilities ?-Incentive spirometer, low threshold for chest imaging if worsening respiratory status ? ?AKI ?Improving.  Creatinine 1.75 > 1.35.  Continues on IV fluids and p.o. intake is improving. ?-Will leave fluids on for now given increased insensible losses with fever ?-AM BMP ? ?Hypertension ?BP well-controlled off medication. 124/79 most recently. ?- Continue to monitor ? ?Chronic, stable conditions ?GERD- protonix BID ?Anxiety-  Lexapro daily, hydroxyzine PRN ?Allergic rhinitis- Flonase ?IDA- holding Iron in setting of infection ? ?FEN/GI: Regular ?PPx: Lovenox ?Dispo:Home in 2-3 days. Barriers include continued clinical improvement.  ? ?Subjective:  ?Ms. Miner reports that she felt better overnight but that she is feeling ill again this morning.  Feels that she may have a another fever.  She is hungry at this time and is interested in eating and drinking.  She is also curious about when she can expect to go home. ? ?Objective: ?Temp:  [98.7 ?F (37.1 ?C)-103.1 ?F (39.5 ?C)] 100 ?F (37.8 ?C) (04/05 0300) ?Pulse Rate:  [105-139] 105 (04/05 0300) ?Resp:  [20-34] 21 (04/05 0300) ?BP: (96-128)/(43-94) 120/80 (04/05 0300) ?SpO2:  [93 %-100 %] 94 % (04/05 0300) ?Weight:  [115.7 kg] 115.7 kg (04/04 1604) ?Physical Exam: ?General: Uncomfortable but nontoxic, lying in bed ?Cardiovascular: Tachycardic, no murmurs/rub/gallop ?Respiratory: Normal work of breathing on room air, lungs clear ?Abdomen: Diffuse tenderness to palpation worse on right than left ?Extremities: No edema or deformity ? ?Laboratory: ?Recent Labs  ?Lab 03/10/22 ?0830 03/10/22 ?0840 03/11/22 ?0255 03/12/22 ?0346  ?WBC 30.5*  --  26.9* 15.9*  ?HGB 10.7* 11.6* 10.1* 9.5*  ?HCT 32.0* 34.0* 30.0* 29.1*  ?PLT 202  --  141* 129*  ? ?Recent Labs  ?Lab 03/10/22 ?0830 03/10/22 ?0840 03/11/22 ?0255 03/12/22 ?0346  ?NA 133* 135 134* 133*  ?K 3.1* 3.2* 4.2 3.6  ?CL 102 98 105 107  ?CO2 24  --  21* 18*  ?BUN 10 10 10 9   ?CREATININE 1.58* 1.50* 1.75* 1.35*  ?CALCIUM 8.5*  --  7.5* 7.3*  ?PROT 6.4*  --   --   --   ?BILITOT 0.7  --   --   --   ?  ALKPHOS 76  --   --   --   ?ALT 16  --   --   --   ?AST 11*  --   --   --   ?GLUCOSE 142* 140* 117* 96  ? ? ?Imaging/Diagnostic Tests: ?CT ABDOMEN PELVIS W CONTRAST ?CLINICAL DATA:  "Acute abdominal pain".  Right upper quadrant pain. ? ?EXAM: ?CT ABDOMEN AND PELVIS WITH CONTRAST ? ?TECHNIQUE: ?Multidetector CT imaging of the abdomen and pelvis was  performed ?using the standard protocol following bolus administration of ?intravenous contrast. ? ?RADIATION DOSE REDUCTION: This exam was performed according to the ?departmental dose-optimization program which includes automated ?exposure control, adjustment of the mA and/or kV according to ?patient size and/or use of iterative reconstruction technique. ? ?CONTRAST:  100mL OMNIPAQUE IOHEXOL 300 MG/ML  SOLN ? ?COMPARISON:  Today's ultrasound of the right upper quadrant, ?dictated separately. No comparison CT. ? ?FINDINGS: ?Lower chest: Clear lung bases. Mild cardiomegaly, without ?pericardial or pleural effusion. ? ?Hepatobiliary: Hepatomegaly at 20.9 cm craniocaudal. No focal liver ?lesion. No calcified gallstones. No significant pericholecystic ?edema. ? ?Pancreas: Normal, without mass or ductal dilatation. ? ?Spleen: Normal in size, without focal abnormality. ? ?Adrenals/Urinary Tract: Normal adrenal glands. There is moderately ?heterogeneous right renal enhancement with perirenal edema. The ?heterogeneous enhancement is most apparent on delayed images, ?including 15/6. ? ?No hydronephrosis. The right ureter is minimally prominent with ?enhancement along its proximal course. It is difficult to follow ?distally. Calcifications within the pelvis are favored to be extra ?ureteric. There are 2 small calcifications in the region of the ?distal right ureter including on 71/3. Normal urinary bladder. ? ?Stomach/Bowel: Normal stomach, without wall thickening. Normal ?colon, appendix, and terminal ileum. Normal small bowel. ? ?Vascular/Lymphatic: Normal caliber of the aorta and branch vessels. ?No abdominopelvic adenopathy. ? ?Reproductive: Normal uterus.  No adnexal mass. ? ?Other: No significant free fluid.  No free intraperitoneal air. ? ?Musculoskeletal: No acute osseous abnormality. ? ?IMPRESSION: ?1. Heterogeneous right renal enhancement and surrounding edema, most ?consistent with pyelonephritis. Favor secondary  minimal proximal ?right ureteric prominence secondary to ascending infection. Pelvic ?calcifications are felt to be vascular. Correlate with urinalysis to ?exclude unlikely concurrent distal right ureteric stone. ?2. Despite the ultrasound findings, no convincing CT evidence of ?acute cholecystitis. ?3. Hepatomegaly. ? ?Electronically Signed ?  By: Jeronimo GreavesKyle  Talbot M.D. ?  On: 03/10/2022 10:58 ?US Abdomen Limited RUQ (LIVER/GB) ?CLINICAL DATA:  Right upper quadrant pain. ? ?EXAM: ?ULTRASOUND ABDOMEN LIMITED RIGHT UPPER QUADRANT ? ?COMPARISON:  10/20/2017. ? ?FINDINGS: ?Gallbladder: ? ?Multiple shadowing echogenic stones measure up to approximately 1.3 ?cm. Slight wall thickening, 4 mm. Positive sonographic Murphy sign. ? ?Common bile duct: ? ?Diameter: 4 mm, within normal limits. No intrahepatic biliary ductal ?dilatation. ? ?Liver: ? ?No focal lesion identified. Within normal limits in parenchymal ?echogenicity. Portal vein is patent on color Doppler imaging with ?normal direction of blood flow towards the liver. ? ?Other: None. ? ?IMPRESSION: ?Cholelithiasis with borderline wall thickening and positive ?sonographic Murphy sign, findings indicative of acute cholecystitis. ? ?Electronically Signed ?  By: Leanna BattlesMelinda  Blietz M.D. ?  On: 03/10/2022 08:20 ?DG Chest Portable 1 View ?CLINICAL DATA:  Shortness of breath. ? ?EXAM: ?PORTABLE CHEST 1 VIEW ? ?COMPARISON:  October 20, 2021. ? ?FINDINGS: ?The heart size and mediastinal contours are within normal limits. ?Both lungs are clear. The visualized skeletal structures are ?unremarkable. ? ?IMPRESSION: ?No active disease. ? ?Electronically Signed ?  By: Lupita RaiderJames  Green Jr M.D. ?  On: 03/10/2022 08:11 ? ? ? ?  Alicia Amel, MD ?03/12/2022, 6:54 AM ?PGY-1, [facility] ?FPTS Intern pager: [phone], text pages welcome ? ?

## 2022-03-12 NOTE — Progress Notes (Signed)
Interim Progress Note  ? ?Curbsided Dr. Junious Silk with [facility] regarding patient given previous CT abd/pelvis on 4/3 report that stated: "Pelvic calcifications are felt to be vascular. Correlate with urinalysis to exclude unlikely concurrent distal right ureteric stone". He was able to review CT scan. He recommends to order CT Abd/Pelv with contrast with delays through the ureter if patient re-spikes a fever this afternoon.  ? ?States that if anything changes on the imaging or if patient has a stone, to discuss again with Urology.  ? ? ?

## 2022-03-12 NOTE — Progress Notes (Signed)
FPTS Interim Progress Note ? ?S:Went to bedside to check on patient alongside Dr. Barbaraann Faster. She was sitting on the side of the bed. Denies any concerns, doing well overall.  ? ?O: ?BP 112/71 (BP Location: Left Arm)   Pulse 98   Temp 99.8 ?F (37.7 ?C) (Oral)   Resp 20   Ht 5\' 2"  (1.575 m)   Wt 115.7 kg   LMP 02/19/2022 (Approximate)   SpO2 96%   BMI 46.65 kg/m?   ?General: Patient sitting upright comfortably, in no acute distress. ?CV: RRR, no murmurs or gallops auscultated  ?Resp: CTAB, no wheezing, rales or rhonchi noted, breathing comfortably on room air without signs of respiratory distress  ?MSK: no CVA tenderness noted  ?Psych: mood appropriate, pleasant  ? ?A/P: ?Pyelonephritis: Improving although concerned for ongoing fever. Will continue ceftriaxone and flagyl to cover for possible intraabdominal infection. If fever escalates to temp 101 or higher, will plan to obtain CT abdomen to evaluate for intraabdominal infectious etiology. Vitals, telemetry and orders reviewed. Remainder of plan per day team.  ? ?*Delayed note, rounded on patient around 8:45 pm  ? ?02/21/2022, DO ?03/12/2022, 10:55 PM ?PGY-2, [facility] ?Service pager [phone]  ?

## 2022-03-13 ENCOUNTER — Inpatient Hospital Stay (HOSPITAL_COMMUNITY): Payer: Medicaid Other

## 2022-03-13 DIAGNOSIS — A4151 Sepsis due to Escherichia coli [E. coli]: Secondary | ICD-10-CM | POA: Diagnosis not present

## 2022-03-13 DIAGNOSIS — N12 Tubulo-interstitial nephritis, not specified as acute or chronic: Secondary | ICD-10-CM | POA: Diagnosis not present

## 2022-03-13 DIAGNOSIS — R059 Cough, unspecified: Secondary | ICD-10-CM | POA: Diagnosis not present

## 2022-03-13 DIAGNOSIS — R109 Unspecified abdominal pain: Secondary | ICD-10-CM | POA: Diagnosis not present

## 2022-03-13 LAB — CBC
HCT: 28.5 % — ABNORMAL LOW (ref 36.0–46.0)
Hemoglobin: 9.7 g/dL — ABNORMAL LOW (ref 12.0–15.0)
MCH: 26.9 pg (ref 26.0–34.0)
MCHC: 34 g/dL (ref 30.0–36.0)
MCV: 78.9 fL — ABNORMAL LOW (ref 80.0–100.0)
Platelets: 148 10*3/uL — ABNORMAL LOW (ref 150–400)
RBC: 3.61 MIL/uL — ABNORMAL LOW (ref 3.87–5.11)
RDW: 15.3 % (ref 11.5–15.5)
WBC: 10.1 10*3/uL (ref 4.0–10.5)
nRBC: 0 % (ref 0.0–0.2)

## 2022-03-13 LAB — BASIC METABOLIC PANEL
Anion gap: 8 (ref 5–15)
BUN: 7 mg/dL (ref 6–20)
CO2: 19 mmol/L — ABNORMAL LOW (ref 22–32)
Calcium: 7.7 mg/dL — ABNORMAL LOW (ref 8.9–10.3)
Chloride: 108 mmol/L (ref 98–111)
Creatinine, Ser: 1.32 mg/dL — ABNORMAL HIGH (ref 0.44–1.00)
GFR, Estimated: 56 mL/min — ABNORMAL LOW (ref >60)
Glucose, Bld: 106 mg/dL — ABNORMAL HIGH (ref 70–99)
Potassium: 3.4 mmol/L — ABNORMAL LOW (ref 3.5–5.1)
Sodium: 135 mmol/L (ref 135–145)

## 2022-03-13 LAB — CULTURE, BLOOD (ROUTINE X 2)

## 2022-03-13 MED ORDER — POTASSIUM CHLORIDE 20 MEQ PO PACK
40.0000 meq | PACK | Freq: Once | ORAL | Status: AC
  Administered 2022-03-14: 40 meq via ORAL
  Filled 2022-03-13: qty 2

## 2022-03-13 MED ORDER — IOHEXOL 300 MG/ML  SOLN
100.0000 mL | Freq: Once | INTRAMUSCULAR | Status: AC | PRN
  Administered 2022-03-13: 100 mL via INTRAVENOUS

## 2022-03-13 MED ORDER — OXYCODONE HCL 5 MG PO TABS
5.0000 mg | ORAL_TABLET | ORAL | Status: DC | PRN
  Administered 2022-03-13 – 2022-03-14 (×2): 5 mg via ORAL
  Filled 2022-03-13 (×2): qty 1

## 2022-03-13 MED ORDER — CEFAZOLIN SODIUM-DEXTROSE 1-4 GM/50ML-% IV SOLN: 1.0000 g | Freq: Three times a day (TID) | INTRAVENOUS | Status: DC

## 2022-03-13 MED ORDER — CEFAZOLIN SODIUM-DEXTROSE 2-4 GM/100ML-% IV SOLN
2.0000 g | Freq: Three times a day (TID) | INTRAVENOUS | Status: DC
  Administered 2022-03-13 – 2022-03-14 (×3): 2 g via INTRAVENOUS
  Filled 2022-03-13 (×3): qty 100

## 2022-03-13 NOTE — Progress Notes (Addendum)
FPTS Interim Progress Note ? ?Patient spiked temperature of 101.7, CT abdomen ordered for concern of potential intraabdominal infectious etiology. Already discussed this plan with patient earlier in the night. Called nurse to update on plan of obtaining imaging, she is aware.  ? Reece Leader, DO ?03/13/2022, 1:22 AM ?PGY-2, [facility] ?Service pager [phone]  ?

## 2022-03-13 NOTE — Progress Notes (Signed)
Returned from XR at this time. Pt in stable condition ?

## 2022-03-13 NOTE — Progress Notes (Addendum)
Family Medicine Teaching Service ?Daily Progress Note ?Intern Pager: [phone] ? ?Patient name: Fronnie Urton Barbero Medical record number: [id number] ?Date of birth: 1990/12/11 Age: 31 y.o. Gender: female ? ?Primary Care Provider: Cora Collum, DO ?Consultants: General surgery (s/o) ?Code Status: Full ? ?Pt Overview and Major Events to Date:  ?4/3- admitted ? ?Assessment and Plan: ?Billie Trager Lemere is a 31 y.o. female presenting with pyelonephritis with persistent fever. PMH is significant for history of UTIs, asthma, GERD, seasonal allergies, anxiety, anemia ? ?Pyelonephritis  E coli bacteremia  Sepsis on arrival ?Patient remained intermittently febrile over the past 24 hours, though note that overall fever curve is downtrending with fevers in the 101s today compared to 102-103 previously. Repeat CT Abd/Pelv ordered overnight due to recurrent fever at the suggestion of Dr. Mena Goes, urology.  Scan did not show evidence of a ureteral stone but did raise concern for a possible right-sided pleural effusion.  AM labs reassuring with WBC continuing to downtrend 30.5>>>10.1. Susceptibilities returned for E Coli showing susceptibility to cefazolin.   ?- Await CT AP result ?- If no concern for new intra-abdominal infection, can narrow abx to Ancef ?- Continue PRN tylenol for fever ?- Continue PRN oxycodone for pain ? ?Cough  Sore throat ?Remains stable on room air.  Given symptom of cough and CT findings of her right-sided pleural effusion, along with protracted fever course, will pursue imaging to rule out pneumonia.  More likely however, it is very possible that she has picked up a URI so we will obtain an RVP. ?-Chest x-ray ?-Respiratory pathogen panel ?- Incentive spirometer ? ?AKI ?Cr stable today, 1.35>1.32. PO intake remains poor. ?- Continue IVF ?- Follow on BMP  ? ?Chronic, stable conditions ?Hypertension- no therapy ?GERD- protonix BID ?Anxiety- Lexapro daily, hydroxyzine PRN ?Allergic rhinitis- Flonase ?IDA-  holding Iron in setting of infection ? ?FEN/GI: Regular ?PPx: Lovenox ?Dispo:Home tomorrow. Barriers include clinical improvement, CT today..  ? ?Subjective:  ?Ms. Miner reports feeling well this morning.  She did have some fever overnight.  Her biggest concern at this time is when she might be able to discharge home to care for her children.  She does endorse some ongoing cough and sore throat but feels like her breathing is at her baseline.  Denies any progression in the cough or development of sputum. ? ?Objective: ?Temp:  [98.9 ?F (37.2 ?C)-101.7 ?F (38.7 ?C)] 98.9 ?F (37.2 ?C) (04/06 0630) ?Pulse Rate:  [82-114] 82 (04/06 0630) ?Resp:  [18-20] 18 (04/06 0630) ?BP: (103-135)/(63-93) 118/81 (04/06 0630) ?SpO2:  [90 %-97 %] 94 % (04/06 0835) ?Physical Exam: ?General: Uncomfortable but nontoxic, lying in bed ?Cardiovascular: Regular rate today, regular rhythm, no murmur ?Respiratory: No increased work of breathing, lungs clear in all fields ?Abdomen: Abdomen tender to palpation but notably improved compared to previous exams, remains with marked flank tenderness on the right side ?Extremities: Without edema or deformity ? ?Laboratory: ?Recent Labs  ?Lab 03/11/22 ?0255 03/12/22 ?0346 03/13/22 ?7793  ?WBC 26.9* 15.9* 10.1  ?HGB 10.1* 9.5* 9.7*  ?HCT 30.0* 29.1* 28.5*  ?PLT 141* 129* 148*  ? ?Recent Labs  ?Lab 03/10/22 ?0830 03/10/22 ?0840 03/11/22 ?0255 03/12/22 ?9030 03/13/22 ?0923  ?NA 133*   < > 134* 133* 135  ?K 3.1*   < > 4.2 3.6 3.4*  ?CL 102   < > 105 107 108  ?CO2 24  --  21* 18* 19*  ?BUN 10   < > 10 9 7   ?CREATININE 1.58*   < >  1.75* 1.35* 1.32*  ?CALCIUM 8.5*  --  7.5* 7.3* 7.7*  ?PROT 6.4*  --   --   --   --   ?BILITOT 0.7  --   --   --   --   ?ALKPHOS 76  --   --   --   --   ?ALT 16  --   --   --   --   ?AST 11*  --   --   --   --   ?GLUCOSE 142*   < > 117* 96 106*  ? < > = values in this interval not displayed.  ? ? ?Imaging/Diagnostic Tests: ?No new imaging, tests ? ? ?Alicia Amel, MD ?03/13/2022,  9:32 AM ?PGY-1, [facility] ?FPTS Intern pager: [phone], text pages welcome ? ?

## 2022-03-13 NOTE — Progress Notes (Signed)
Pt left per bed for XR. Left in stable condition ?

## 2022-03-13 NOTE — Progress Notes (Signed)
Rechecking pt's temp at this time ?

## 2022-03-14 ENCOUNTER — Other Ambulatory Visit (HOSPITAL_COMMUNITY): Payer: Self-pay

## 2022-03-14 DIAGNOSIS — A4151 Sepsis due to Escherichia coli [E. coli]: Secondary | ICD-10-CM | POA: Diagnosis not present

## 2022-03-14 DIAGNOSIS — N12 Tubulo-interstitial nephritis, not specified as acute or chronic: Secondary | ICD-10-CM | POA: Diagnosis not present

## 2022-03-14 LAB — CBC
HCT: 26.4 % — ABNORMAL LOW (ref 36.0–46.0)
Hemoglobin: 8.9 g/dL — ABNORMAL LOW (ref 12.0–15.0)
MCH: 26.6 pg (ref 26.0–34.0)
MCHC: 33.7 g/dL (ref 30.0–36.0)
MCV: 78.8 fL — ABNORMAL LOW (ref 80.0–100.0)
Platelets: 143 10*3/uL — ABNORMAL LOW (ref 150–400)
RBC: 3.35 MIL/uL — ABNORMAL LOW (ref 3.87–5.11)
RDW: 15.4 % (ref 11.5–15.5)
WBC: 13 10*3/uL — ABNORMAL HIGH (ref 4.0–10.5)
nRBC: 0 % (ref 0.0–0.2)

## 2022-03-14 LAB — RESPIRATORY PANEL BY PCR

## 2022-03-14 LAB — BASIC METABOLIC PANEL
Anion gap: 6 (ref 5–15)
BUN: 5 mg/dL — ABNORMAL LOW (ref 6–20)
CO2: 22 mmol/L (ref 22–32)
Calcium: 7.7 mg/dL — ABNORMAL LOW (ref 8.9–10.3)
Chloride: 110 mmol/L (ref 98–111)
Creatinine, Ser: 1.21 mg/dL — ABNORMAL HIGH (ref 0.44–1.00)
GFR, Estimated: >60 mL/min (ref >60)
Glucose, Bld: 86 mg/dL (ref 70–99)
Potassium: 3.4 mmol/L — ABNORMAL LOW (ref 3.5–5.1)
Sodium: 138 mmol/L (ref 135–145)

## 2022-03-14 MED ORDER — SENNOSIDES-DOCUSATE SODIUM 8.6-50 MG PO TABS
1.0000 | ORAL_TABLET | Freq: Every day | ORAL | 0 refills | Status: DC
  Filled 2022-03-14: qty 5, 5d supply, fill #0

## 2022-03-14 MED ORDER — CEFADROXIL 500 MG PO CAPS
500.0000 mg | ORAL_CAPSULE | Freq: Two times a day (BID) | ORAL | Status: DC
  Administered 2022-03-14: 500 mg via ORAL
  Filled 2022-03-14 (×2): qty 1

## 2022-03-14 MED ORDER — CEFDINIR 300 MG PO CAPS: 300.0000 mg | ORAL_CAPSULE | Freq: Two times a day (BID) | ORAL | Status: DC

## 2022-03-14 MED ORDER — OXYCODONE HCL 5 MG PO TABS
5.0000 mg | ORAL_TABLET | ORAL | 0 refills | Status: DC | PRN
  Filled 2022-03-14: qty 10, 2d supply, fill #0

## 2022-03-14 MED ORDER — CEFADROXIL 500 MG PO CAPS
500.0000 mg | ORAL_CAPSULE | Freq: Two times a day (BID) | ORAL | 0 refills | Status: AC
  Filled 2022-03-14: qty 10, 5d supply, fill #0

## 2022-03-14 NOTE — Discharge Instructions (Addendum)
Ms. Claus, ?You were admitted to the hospital for an infection in your kidney called pyelonephritis. We treated you with antibiotics for your infection and oxycodone for pain control.  ? ?POST-HOSPITAL & CARE INSTRUCTIONS ?You should continue taking your antibiotic twice daily through 4/12.  ?You may continue taking oxycodone as needed until your pain resolves. ?I recommend continuing to take Senna for the next few days to help treat your constipation.  ?We will see you in our office on Tuesday at 1:50pm ?Go to your follow up appointments (listed below) ? ? ?DOCTOR'S APPOINTMENT   ?Future Appointments  ?Date Time Provider Department Center  ?04/08/2022  1:00 PM Cozart, Neena Rhymes, LCSW [facility] None  ?04/10/2022  3:30 PM Pashayan, Mardelle Matte, MD [facility] None  ?04/29/2022  3:00 PM [facility] PFT ROOM [facility] None  ?04/29/2022  4:15 PM Byrum, Les Pou, MD [facility] None  ? ? Follow-up Information   ? ? Cora Collum, DO. Go on 03/18/2022.   ?Specialty: Family Medicine ?Why: Please go to Dr. Reynolds Bowl office Tuesday 4/11 for a 1:50p appointment. One of Dr. Reynolds Bowl partners will see you. Please arrive by 1:30pm. ?Contact information: ?[address] ?[place] [address] ?[phone] ? ? ?  ?  ? ? Jake Bathe, MD .   ?Specialty: Cardiology ?Contact information: ?[address] ?[address] ?[place] [address] ?[phone] ? ? ?  ?  ? ?  ?  ? ?  ? ? ?Take care and be well! ? ?Family Medicine Teaching Service Inpatient Team ?[facility]  ?[facility]  ?[address] [place], [place] [address] ?[phone] ? ? ? ?

## 2022-03-14 NOTE — Plan of Care (Signed)

## 2022-03-14 NOTE — Plan of Care (Signed)
  Problem: Education: Goal: Knowledge of General Education information will improve Description: Including pain rating scale, medication(s)/side effects and non-pharmacologic comfort measures Outcome: Adequate for Discharge   Problem: Health Behavior/Discharge Planning: Goal: Ability to manage health-related needs will improve Outcome: Adequate for Discharge   Problem: Clinical Measurements: Goal: Ability to maintain clinical measurements within normal limits will improve Outcome: Adequate for Discharge   Problem: Clinical Measurements: Goal: Ability to maintain clinical measurements within normal limits will improve Outcome: Adequate for Discharge Goal: Will remain free from infection Outcome: Adequate for Discharge Goal: Diagnostic test results will improve Outcome: Adequate for Discharge Goal: Respiratory complications will improve Outcome: Adequate for Discharge Goal: Cardiovascular complication will be avoided Outcome: Adequate for Discharge   

## 2022-03-14 NOTE — Progress Notes (Signed)
FPTS Interim Progress Note ? ?S:Went to bedside to check on patient, she was resting comfortably. I did not wake the patient. Also discussed with nurse, Darl Pikes, who denies any concerns as well.  ? ?O: ?BP 122/73 (BP Location: Left Wrist)   Pulse 93   Temp 98.6 ?F (37 ?C) (Oral)   Resp 18   Ht 5\' 2"  (1.575 m)   Wt 115.7 kg   LMP 02/19/2022 (Approximate)   SpO2 93%   BMI 46.65 kg/m?   ?General: Patient sleeping comfortably, in no acute distress. ?Resp: normal work of breathing noted  ? ?A/P: ?Vitals, telemetry and orders reviewed. Remains afebrile with CXR demonstrating no active cardiopulmonary disease. Pending respiratory pathogen panel. Possibly consider transitioning to oral antibiotics tomorrow. Remainder of plan per day team.  ? 02/21/2022, DO ?03/14/2022, 2:23 AM ?PGY-2, [facility] ?Service pager [phone]  ?

## 2022-03-14 NOTE — Progress Notes (Signed)
Completed discharge med list. Reviewed discharge summary, follow up appointments, next medication administration times, discontinued medications, and post op care. Patient verbalized understanding. Delivered TOC medications. All belonging to include jewelry and electronics in the patient's care. IV and telemetry monitor removed. CCMD was notified.  ?

## 2022-03-14 NOTE — Discharge Summary (Signed)
Family Medicine Teaching Service ?Hospital Discharge Summary ? ?Patient name: Veronica [name] Medical record number: [id number] ?Date of birth: [birth year] Age: 31 y.o. Gender: female ?Date of Admission: 03/10/2022  Date of Discharge: 03/14/2022 ?Admitting Physician: Dickie La, MD ? ?Primary Care Provider: Shary Key, DO ?Consultants: None ? ?Indication for Hospitalization: Pyelonephritis ? ?Discharge Diagnoses/Problem List:  ?Principal Problem: ?  Pyelonephritis ?Active Problems: ?  Gallstones ?  Sepsis ([place]) ?  E Coli Bacteremia ? ?Disposition: Home ? ?Discharge Condition: Stable ? ?Discharge Exam:  ?From my same-day progress note: ?General: Sleeping on arrival, awakens easily to voice, NAD ?Cardiovascular: Regular rate and rhythm, without murmurs ?Respiratory: Without increased work of breathing, lungs are clear throughout ?Abdomen: Abdomen is soft, tender on right side but improved compared to previous, without fluid wave or mass ?Extremities: No edema or deformity ? ?Brief Hospital Course:  ?Veronica [name] is a 31 y.o. female presenting with abdominal pain and fever. PMH is significant for history of UTIs, asthma, GERD, seasonal allergies, anxiety, anemia.  ? ?Pyelonephritis  E Coli Bacteremia ?Patient presented to the [facility] on 4/3 with nausea, vomiting, and diarrhea along. On arrival to the ED she was found to meet sepsis criteria with fever, tachycardia, tachypnea, and WBC elevated to 30.  Initial imaging demonstrated a right upper quadrant ultrasound concerning for cholecystitis with partially thickened gallbladder wall and sonographic Murphy sign.  General surgery was consulted and requested a CT abdomen/pelvis which did not show signs of cholecystitis but did demonstrate concern for pyelonephritis. She was admitted to our service for IV antibiotics and IV fluids. Her blood culture resulted positive for E Coli bacteremia.  She received IV Zosyn 4/3-4/4 and was transitioned to ceftriaxone from 4/4 -  4/7. She continued to fever intermittently until the morning of 4/6. At the time of discharge, she had remained fever free for >24 hours and had successfully transitioned to oral antibiotics with cefdinir. She was discharged home in stable condition with instructions to complete a total 10 day course of antibiotics with cefdinir. ? ?AKI ?Patient was found to have a cr elevated to 1.5 on admission, up from a baseline around 1. Her creatinine downtrended over the course of hospitalization to 1.21 at the time of discharge. ? ?Items for PCP follow-up ?1) Patient had nighttime desaturations raising concern for possible OSA. Recommend outpt sleep study.  ?2) Recommend repeat BMP to ensure improvement in Cr ?3) Patient will complete course of antibiotics on 4/11 ? ? ?Significant Procedures: None ? ?Significant Labs and Imaging:  ?Recent Labs  ?Lab 03/12/22 ?0346 03/13/22 ?[id number] 03/14/22 ?[id number]  ?WBC 15.9* 10.1 13.0*  ?HGB 9.5* 9.7* 8.9*  ?HCT 29.1* 28.5* 26.4*  ?PLT 129* 148* 143*  ? ?Recent Labs  ?Lab 03/10/22 ?0830 03/10/22 ?0840 03/11/22 ?0255 03/12/22 ?[id number] 03/13/22 ?[id number] 03/14/22 ?[id number]  ?NA 133* 135 134* 133* 135 138  ?K 3.1* 3.2* 4.2 3.6 3.4* 3.4*  ?CL 102 98 105 107 108 110  ?CO2 24  --  21* 18* 19* 22  ?GLUCOSE 142* 140* 117* 96 106* 86  ?BUN 10 10 10 9 7  5*  ?CREATININE 1.58* 1.50* 1.75* 1.35* 1.32* 1.21*  ?CALCIUM 8.5*  --  7.5* 7.3* 7.7* 7.7*  ?ALKPHOS 76  --   --   --   --   --   ?AST 11*  --   --   --   --   --   ?ALT 16  --   --   --   --   --   ?  ALBUMIN 2.7*  --   --   --   --   --   ? ? ?DG Chest 2 View ?CLINICAL DATA:  Cough ? ?EXAM: ?CHEST - 2 VIEW ? ?COMPARISON:  03/10/2022 ? ?FINDINGS: ?The heart size and mediastinal contours are within normal limits. ?Both lungs are clear. The visualized skeletal structures are ?unremarkable. ? ?IMPRESSION: ?No active cardiopulmonary disease. ? ?Electronically Signed ?  By: Inez Catalina M.D. ?  On: 03/13/2022 20:24 ?CT ABDOMEN PELVIS W CONTRAST ?CLINICAL DATA:   Right-sided abdominal pain, pyelonephritis ? ?EXAM: ?CT ABDOMEN AND PELVIS WITH CONTRAST ? ?TECHNIQUE: ?Multidetector CT imaging of the abdomen and pelvis was performed ?using the standard protocol following bolus administration of ?intravenous contrast. ? ?RADIATION DOSE REDUCTION: This exam was performed according to the ?departmental dose-optimization program which includes automated ?exposure control, adjustment of the mA and/or kV according to ?patient size and/or use of iterative reconstruction technique. ? ?CONTRAST:  192mL OMNIPAQUE IOHEXOL 300 MG/ML  SOLN ? ?COMPARISON:  CT abdomen pelvis, 03/10/2022 ? ?FINDINGS: ?Lower chest: Trace right pleural effusion.  Small hiatal hernia. ? ?Hepatobiliary: No solid liver abnormality is seen. No gallstones, ?gallbladder wall thickening, or biliary dilatation. ? ?Pancreas: Unremarkable. No pancreatic ductal dilatation or ?surrounding inflammatory changes. ? ?Spleen: Normal in size without significant abnormality. ? ?Adrenals/Urinary Tract: Adrenal glands are unremarkable. Similar ?right-sided perinephric fat stranding, most conspicuously about the ?inferior pole, with small foci of renal cortical hypoenhancement ?(series 3, image 41). Fat stranding about the incompletely opacified ?right ureter. There are again rounded calculi in the vicinity of the ?bilateral ureters, most likely phleboliths (series 7, image 57). The ?left kidney is normal, without renal calculi, solid lesion, or ?hydronephrosis. Bladder is unremarkable. ? ?Stomach/Bowel: Stomach is within normal limits. Appendix appears ?normal. No evidence of bowel wall thickening, distention, or ?inflammatory changes. ? ?Vascular/Lymphatic: No significant vascular findings are present. No ?enlarged abdominal or pelvic lymph nodes. ? ?Reproductive: No mass or other significant abnormality. ? ?Other: No abdominal wall hernia or abnormality. No ascites. ? ?Musculoskeletal: No acute or significant osseous  findings. ? ?IMPRESSION: ?1. Similar right-sided perinephric fat stranding, most conspicuously ?about the inferior pole, with small foci of renal cortical ?hypoenhancement. Findings remain consistent with pyelonephritis. ?2. Fat stranding about the incompletely opacified right ureter. ?There are again rounded calculi in the vicinity of the bilateral ?distal ureters, most likely phleboliths, ureteral calculi not ?favored. ?3. Trace right pleural effusion. ? ?Electronically Signed ?  By: Delanna Ahmadi M.D. ?  On: 03/13/2022 09:46 ? ? ? ?Results/Tests Pending at Time of Discharge: None ? ?Discharge Medications:  ?Allergies as of 03/14/2022   ? ?   Reactions  ? Morphine And Related Other (See Comments)  ? Causes "chest to be heavy"  ? Motrin [ibuprofen]   ? Was told to not take this because it would flare stomach issues  ? ?  ? ?  ?Medication List  ?  ? ?STOP taking these medications   ? ?benzonatate 100 MG capsule ?Commonly known as: TESSALON ?  ?NIFEdipine 30 MG 24 hr tablet ?Commonly known as: PROCARDIA-XL/NIFEDICAL-XL ?  ?predniSONE 20 MG tablet ?Commonly known as: DELTASONE ?  ?promethazine-dextromethorphan 6.25-15 MG/5ML syrup ?Commonly known as: PROMETHAZINE-DM ?  ? ?  ? ?TAKE these medications   ? ?acetaminophen 500 MG tablet ?Commonly known as: TYLENOL ?Take 2 tablets (1,000 mg total) by mouth every 6 (six) hours as needed for moderate pain. ?  ?albuterol 108 (90 Base) MCG/ACT inhaler ?Commonly known as: VENTOLIN HFA ?  INHALE 2 PUFFS BY MOUTH EVERY 4 HOURS AS NEEDED FOR WHEEZING OR SHORTNESS OF BREATH (COUGH). ?What changed: See the new instructions. ?  ?cefadroxil 500 MG capsule ?Commonly known as: DURICEF ?Take 1 capsule (500 mg total) by mouth 2 (two) times daily for 5 days. ?  ?cetirizine 5 MG tablet ?Commonly known as: ZYRTEC ?Take 5 mg by mouth daily. ?  ?docusate sodium 100 MG capsule ?Commonly known as: COLACE ?Take 1 capsule (100 mg total) by mouth 2 (two) times daily. ?  ?escitalopram 10 MG  tablet ?Commonly known as: Lexapro ?Take 1 tablet (10 mg total) by mouth daily. ?  ?ferrous sulfate 325 (65 FE) MG tablet ?Take 1 tablet (325 mg total) by mouth every other day. ?  ?fluticasone 110 MCG/ACT inhaler ?Commonly known as: Financial trader

## 2022-03-14 NOTE — Progress Notes (Signed)
Family Medicine Teaching Service ?Daily Progress Note ?Intern Pager: [phone] ? ?Patient name: Veronica [name] Medical record number: [id number] ?Date of birth: 01/28/1991 Age: 31 y.o. Gender: female ? ?Primary Care Provider: Cora Collum, DO ?Consultants: None ?Code Status: Full ? ?Pt Overview and Major Events to Date:  ?4/3- admitted ? ?Assessment and Plan: ?Veronica [name] is a 31 y.o. female presenting with pyelonephritis with persistent fever. PMH is significant for history of UTIs, asthma, GERD, seasonal allergies, anxiety, anemia ? ?Pyelonephritis  E coli bacteremia  Sepsis on arrival ?Patient overall much improved. Now Afebrile >24hours. Hemodynamically stable. Ongoing abdominal pain but improved. WBC 10.1>13.0. Today is day 5 of abx ?- Transition Ancef to Bactrim PO, 10 day total course (last day 4/12) ?- Continue PRN oxycodone for pain ?- PRN Tylenol for fever ? ?Cough  Sore throat ?Had CXR yesterday with no evidence of cardiopulmonary disease. RVP negative this morning. Consider that she may have a virus not tested for by our RVP. ?- Incentive spirometer ?- Chloraseptic spray ? ?Hypokalemia ?K 3.4. ?- Repleted ? ?AKI, improving ?Cr 1.32>1.21. PO intake improving. ?- Follow BMP ?- D/c IVF ? ?Chronic, stable conditions ?Hypertension- no therapy ?GERD- protonix BID ?Anxiety- Lexapro daily, hydroxyzine PRN ?Allergic rhinitis- Flonase ?IDA- holding Iron in setting of infection, hgb stable 9.7>8.9 around baseline ? ?FEN/GI: Regular ?PPx: Lovenox ?Dispo:Home today vs tomorrow. Barriers include transition to oral antibiotics, stable off fluids.  ? ?Subjective:  ?Veronica [name] reports feeling overall well.  She does report ongoing abdominal pain but says that it is better compared to previously.  She is feeling it more in her side/abdomen and then in her back.  She denies any fever/chills overnight.  She is eager to go home and time for Easter. ? ?Objective: ?Temp:  [98.4 ?F (36.9 ?C)-99.8 ?F (37.7 ?C)] 98.4  ?F (36.9 ?C) (04/07 1829) ?Pulse Rate:  [92-93] 92 (04/07 0333) ?Resp:  [18] 18 (04/07 0333) ?BP: (122-125)/(73-91) 125/91 (04/07 0333) ?SpO2:  [90 %-97 %] 90 % (04/07 0333) ?Physical Exam: ?General: Sleeping on arrival, awakens easily to voice, NAD ?Cardiovascular: Regular rate and rhythm, without murmurs ?Respiratory: Without increased work of breathing, lungs are clear throughout ?Abdomen: Abdomen is soft, tender on right side but improved compared to previous, without fluid wave or mass ?Extremities: No edema or deformity ? ?Laboratory: ?Recent Labs  ?Lab 03/12/22 ?0346 03/13/22 ?9371 03/14/22 ?6967  ?WBC 15.9* 10.1 13.0*  ?HGB 9.5* 9.7* 8.9*  ?HCT 29.1* 28.5* 26.4*  ?PLT 129* 148* 143*  ? ?Recent Labs  ?Lab 03/10/22 ?0830 03/10/22 ?0840 03/12/22 ?0346 03/13/22 ?8938 03/14/22 ?1017  ?NA 133*   < > 133* 135 138  ?K 3.1*   < > 3.6 3.4* 3.4*  ?CL 102   < > 107 108 110  ?CO2 24   < > 18* 19* 22  ?BUN 10   < > 9 7 5*  ?CREATININE 1.58*   < > 1.35* 1.32* 1.21*  ?CALCIUM 8.5*   < > 7.3* 7.7* 7.7*  ?PROT 6.4*  --   --   --   --   ?BILITOT 0.7  --   --   --   --   ?ALKPHOS 76  --   --   --   --   ?ALT 16  --   --   --   --   ?AST 11*  --   --   --   --   ?GLUCOSE 142*   < > 96 106*  86  ? < > = values in this interval not displayed.  ? ? ?RVP negative ? ?Imaging/Diagnostic Tests: ?DG Chest 2 View ?CLINICAL DATA:  Cough ? ?EXAM: ?CHEST - 2 VIEW ? ?COMPARISON:  03/10/2022 ? ?FINDINGS: ?The heart size and mediastinal contours are within normal limits. ?Both lungs are clear. The visualized skeletal structures are ?unremarkable. ? ?IMPRESSION: ?No active cardiopulmonary disease. ? ?Electronically Signed ?  By: Alcide Clever M.D. ?  On: 03/13/2022 20:24 ?CT ABDOMEN PELVIS W CONTRAST ?CLINICAL DATA:  Right-sided abdominal pain, pyelonephritis ? ?EXAM: ?CT ABDOMEN AND PELVIS WITH CONTRAST ? ?TECHNIQUE: ?Multidetector CT imaging of the abdomen and pelvis was performed ?using the standard protocol following bolus administration  of ?intravenous contrast. ? ?RADIATION DOSE REDUCTION: This exam was performed according to the ?departmental dose-optimization program which includes automated ?exposure control, adjustment of the mA and/or kV according to ?patient size and/or use of iterative reconstruction technique. ? ?CONTRAST:  OMNIPAQUE IOHEXOL 300 MG/ML  SOLN ? ?COMPARISON:  CT abdomen pelvis, 03/10/2022 ? ?FINDINGS: ?Lower chest: Trace right pleural effusion.  Small hiatal hernia. ? ?Hepatobiliary: No solid liver abnormality is seen. No gallstones, ?gallbladder wall thickening, or biliary dilatation. ? ?Pancreas: Unremarkable. No pancreatic ductal dilatation or ?surrounding inflammatory changes. ? ?Spleen: Normal in size without significant abnormality. ? ?Adrenals/Urinary Tract: Adrenal glands are unremarkable. Similar ?right-sided perinephric fat stranding, most conspicuously about the ?inferior pole, with small foci of renal cortical hypoenhancement ?(series 3, image 41). Fat stranding about the incompletely opacified ?right ureter. There are again rounded calculi in the vicinity of the ?bilateral ureters, most likely phleboliths (series 7, image 57). The ?left kidney is normal, without renal calculi, solid lesion, or ?hydronephrosis. Bladder is unremarkable. ? ?Stomach/Bowel: Stomach is within normal limits. Appendix appears ?normal. No evidence of bowel wall thickening, distention, or ?inflammatory changes. ? ?Vascular/Lymphatic: No significant vascular findings are present. No ?enlarged abdominal or pelvic lymph nodes. ? ?Reproductive: No mass or other significant abnormality. ? ?Other: No abdominal wall hernia or abnormality. No ascites. ? ?Musculoskeletal: No acute or significant osseous findings. ? ?IMPRESSION: ?1. Similar right-sided perinephric fat stranding, most conspicuously ?about the inferior pole, with small foci of renal cortical ?hypoenhancement. Findings remain consistent with pyelonephritis. ?2. Fat stranding  about the incompletely opacified right ureter. ?There are again rounded calculi in the vicinity of the bilateral ?distal ureters, most likely phleboliths, ureteral calculi not ?favored. ?3. Trace right pleural effusion. ? ?Electronically Signed ?  By: Jearld Lesch M.D. ?  On: 03/13/2022 09:46 ? ? ? ?Alicia Amel, MD ?03/14/2022, 6:40 AM ?PGY-1, [facility] ?FPTS Intern pager: [phone], text pages welcome ? ?

## 2022-03-18 ENCOUNTER — Ambulatory Visit (INDEPENDENT_AMBULATORY_CARE_PROVIDER_SITE_OTHER): Payer: Medicaid Other | Admitting: Family Medicine

## 2022-03-18 ENCOUNTER — Encounter: Payer: Self-pay | Admitting: Family Medicine

## 2022-03-18 VITALS — BP 115/78 | HR 74 | Temp 97.9°F | Ht 62.0 in | Wt 241.6 lb

## 2022-03-18 DIAGNOSIS — N179 Acute kidney failure, unspecified: Secondary | ICD-10-CM | POA: Diagnosis not present

## 2022-03-18 DIAGNOSIS — N12 Tubulo-interstitial nephritis, not specified as acute or chronic: Secondary | ICD-10-CM | POA: Diagnosis not present

## 2022-03-18 DIAGNOSIS — D509 Iron deficiency anemia, unspecified: Secondary | ICD-10-CM

## 2022-03-18 DIAGNOSIS — G4734 Idiopathic sleep related nonobstructive alveolar hypoventilation: Secondary | ICD-10-CM | POA: Diagnosis present

## 2022-03-18 NOTE — Progress Notes (Signed)
? ? ? ?  SUBJECTIVE:  ? ?CHIEF COMPLAINT / HPI:  ? ?Veronica [name] is a 31 y.o. female presents for hospital follow up ? ?Pyelonephritis  E Coli Bacteremia ?Patient admitted to hospital between 4/3 and 4/7 and treated for pyelonephritis. Pt  received  IV Zosyn 4/3-4/4 and was transitioned to ceftriaxone from 4/4 - 4/7. She was discharged on cefadroxil due to finish today. Denies fevers lower abdominal pain, dysuria, frequency, urgency or hematuria.  Denies use of vaginal cleanser and douching.  ? ?AKI  ?Cr on 1.21 on discharge. Baseline 0.93. Needs repeat BMP today. ? ?Iron deficiency anemia ?Hb 8.9 on day of discharge.  Does have a hx of menorrhagia. Most recent menstrual was light in flow. Takes ferrous sulfate every other day. Requires repeat CBC today. ? ?Noctural desaturations ?Concern for OSA. Pt snores, sometimes wakes up choking.  ? ?Flowsheet Row Office Visit from 03/18/2022 in [facility]  ?PHQ-9 Total Score 2  ? ?  ?  ?PERTINENT  PMH / PSH: subclinical hypothyroidism, asthma, chronic cough ? ?OBJECTIVE:  ? ?BP 115/78   Pulse 74   Temp 97.9 ?F (36.6 ?C)   Ht 5\' 2"  (1.575 m)   Wt 241 lb 9.6 oz (109.6 kg)   LMP 03/12/2022 (Approximate)   SpO2 99%   BMI 44.19 kg/m?   ? ?General: Alert, no acute distress ?Cardio: Normal S1 and S2, RRR, no r/m/g ?Pulm: CTAB, normal work of breathing ?Abdomen: Bowel sounds normal. Abdomen soft and non-tender. No renal angle tenderness ?Extremities: No peripheral edema.  ?Neuro: Cranial nerves grossly intact  ? ?ASSESSMENT/PLAN:  ? ?Pyelonephritis ?Resolved, completed antibiotics. Continue to monitor for symptoms. Obtained BMP today.  ? ?Nocturnal oxygen desaturation ?Referred for sleep study. ? ?Anemia ?Hx of iron deficiency anemia. Obtained CBC today. If Hb still low will recommend pt takes ferrous sulfate daily instead of every other day. She should follow up with PCP Dr 05/12/2022 in 1 month for recheck to ensure Hb is improving. ? ?AKI (acute kidney  injury) (HCC) ?Obtained BMP today. ?  ? ?Idalia Needle, MD PGY-3 ?[facility]  ?

## 2022-03-18 NOTE — Patient Instructions (Signed)
Thank you for coming to see me today. It was a pleasure. Glad to hear you are doing well after hospital discharge. I recommend: ?-Continue cefadroxil ?-Referred you for a sleep study ?-Stay hydrated ? ?We will get some labs today.  If they are abnormal or we need to do something about them, I will call you.  If they are normal, I will send you a message on MyChart (if it is active) or a letter in the mail.  If you don't hear from [place] in 2 weeks, please call the office at the number below.  ? ?Please follow-up with PCP in one month to check levels for anemia  ? ?If you have any questions or concerns, please do not hesitate to call the office at [phone]. ? ?Best wishes,  ? ?Dr Allena Katz   ?

## 2022-03-19 ENCOUNTER — Ambulatory Visit (HOSPITAL_COMMUNITY): Payer: Self-pay | Admitting: Licensed Clinical Social Worker

## 2022-03-19 LAB — CBC
Hematocrit: 29.4 % — ABNORMAL LOW (ref 34.0–46.6)
Hemoglobin: 9.7 g/dL — ABNORMAL LOW (ref 11.1–15.9)
MCH: 26.3 pg — ABNORMAL LOW (ref 26.6–33.0)
MCHC: 33 g/dL (ref 31.5–35.7)
MCV: 80 fL (ref 79–97)
Platelets: 479 10*3/uL — ABNORMAL HIGH (ref 150–450)
RBC: 3.69 x10E6/uL — ABNORMAL LOW (ref 3.77–5.28)
RDW: 13.7 % (ref 11.7–15.4)
WBC: 7.9 10*3/uL (ref 3.4–10.8)

## 2022-03-19 LAB — BASIC METABOLIC PANEL
BUN/Creatinine Ratio: 8 — ABNORMAL LOW (ref 9–23)
BUN: 8 mg/dL (ref 6–20)
CO2: 25 mmol/L (ref 20–29)
Calcium: 8.4 mg/dL — ABNORMAL LOW (ref 8.7–10.2)
Chloride: 103 mmol/L (ref 96–106)
Creatinine, Ser: 1 mg/dL (ref 0.57–1.00)
Glucose: 84 mg/dL (ref 70–99)
Potassium: 4.6 mmol/L (ref 3.5–5.2)
Sodium: 141 mmol/L (ref 134–144)
eGFR: 78 mL/min/{1.73_m2} (ref >59)

## 2022-03-23 DIAGNOSIS — N179 Acute kidney failure, unspecified: Secondary | ICD-10-CM | POA: Insufficient documentation

## 2022-03-23 DIAGNOSIS — G4734 Idiopathic sleep related nonobstructive alveolar hypoventilation: Secondary | ICD-10-CM | POA: Insufficient documentation

## 2022-03-23 NOTE — Assessment & Plan Note (Addendum)
Resolved, completed antibiotics. Continue to monitor for symptoms. Obtained BMP today.  ?

## 2022-03-23 NOTE — Assessment & Plan Note (Signed)
Obtained BMP today. ?

## 2022-03-23 NOTE — Assessment & Plan Note (Signed)
Hx of iron deficiency anemia. Obtained CBC today. If Hb still low will recommend pt takes ferrous sulfate daily instead of every other day. She should follow up with PCP Dr Idalia Needle in 1 month for recheck to ensure Hb is improving. ?

## 2022-03-23 NOTE — Assessment & Plan Note (Signed)
-   Referred for sleep study.

## 2022-03-25 ENCOUNTER — Ambulatory Visit (INDEPENDENT_AMBULATORY_CARE_PROVIDER_SITE_OTHER): Payer: Medicaid Other | Admitting: Family Medicine

## 2022-03-25 ENCOUNTER — Encounter: Payer: Self-pay | Admitting: Family Medicine

## 2022-03-25 ENCOUNTER — Telehealth: Payer: Self-pay | Admitting: Family Medicine

## 2022-03-25 VITALS — BP 130/80 | HR 82 | Ht 62.0 in | Wt 233.6 lb

## 2022-03-25 DIAGNOSIS — R1011 Right upper quadrant pain: Secondary | ICD-10-CM | POA: Diagnosis not present

## 2022-03-25 DIAGNOSIS — R109 Unspecified abdominal pain: Secondary | ICD-10-CM | POA: Diagnosis not present

## 2022-03-25 LAB — POCT URINALYSIS DIP (MANUAL ENTRY)
Bilirubin, UA: NEGATIVE
Blood, UA: NEGATIVE
Glucose, UA: NEGATIVE mg/dL
Ketones, POC UA: NEGATIVE mg/dL
Nitrite, UA: NEGATIVE
Protein Ur, POC: NEGATIVE mg/dL
Spec Grav, UA: 1.02 (ref 1.010–1.025)
Urobilinogen, UA: 0.2 E.U./dL
pH, UA: 7 (ref 5.0–8.0)

## 2022-03-25 MED ORDER — FLUCONAZOLE 150 MG PO TABS
150.0000 mg | ORAL_TABLET | Freq: Once | ORAL | 0 refills | Status: AC
Start: 2022-03-25 — End: 2022-03-25

## 2022-03-25 NOTE — Progress Notes (Signed)
? ? ?  SUBJECTIVE:  ? ?CHIEF COMPLAINT / HPI:  ? ?Patient presents for hospital follow up  ? ?Patient presented to the [facility] on 4/3 with nausea, vomiting, and diarrhea.  On arrival to the ED she was found to meet sepsis criteria with fever, tachycardia, tachypnea, and WBC elevated to 30.  Initial imaging demonstrated a right upper quadrant ultrasound concerning for cholecystitis with partially thickened gallbladder wall and sonographic Murphy sign.  General surgery was consulted and requested a CT abdomen/pelvis which did not show signs of cholecystitis but did demonstrate concern for pyelonephritis. She was admitted to our service for IV antibiotics and IV fluids. Her blood culture resulted positive for E Coli bacteremia.  She received IV Zosyn 4/3-4/4 and was transitioned to ceftriaxone from 4/4 - 4/7. She continued to fever intermittently until the morning of 4/6. She was discharged on 4/7. Patient was discharged on Cefadroxil for Pyelonephritis  ? ?Today states pain has creeped back up feels it spans from her abdomen to right side. Could not sleep last night. States it comes at night and is gone in the morning. Noticed it 3 days ago. Pain worse with movement though does not happen during the day. States this pain is 9/10.  Denies fevers. Denies dysuria, hematuria. Denies emesis. Has not been eating much but has been drinking a lot of water. Yesterday tried to eat chips and stomach started hurting Denies current pain but does have a headache that started this morning and has been lingering. Also endorses vaginal itching after completing her antibiotics.  ? ? ?OBJECTIVE:  ? ?BP 130/80   Pulse 82   Ht 5\' 2"  (1.575 m)   Wt 233 lb 9.6 oz (106 kg)   LMP 03/12/2022 (Approximate)   SpO2 100%   BMI 42.73 kg/m?   ? ?Physical exam ?General: well appearing, NAD ?Cardiovascular: RRR, no murmurs ?Lungs: CTAB. Normal WOB ?Abdomen: soft, non-distended, non-tender ?Skin: warm, dry. No edema ? ?ASSESSMENT/PLAN:  ? ?No  problem-specific Assessment & Plan notes found for this encounter. ?  ?RUQ pain ?Patient endorses right sided upper abdominal pain after being discharged from the hospital after being admitted from 4/4-4/7 for pyelonephritis. Completed her abx course. Abdominal pain worse after eating certain foods. Denies fevers, dysuria, hematuria. Considering 08-08-1972 concerning for cholecystitis (not shown on CT) and her recurrent symptoms I have placed a referral to general surgery for potential removal of her gallbladder. Will also check renal US, UA and culture given her hospitalization to ensure resolution of infection though it is reassuring that she has been afebrile and without urinary symptoms  ?- Renal US ?- CBC, CMP ?- UA ?- urine culture  ?- gen surgery referral  ? ?Vaginal itching ?Patient has had a couple days of vaginal itching after completing abx for her pyelonephritis. Sent in Diflucan x1.  ? ?[place], DO ?[facility]   ?

## 2022-03-25 NOTE — Telephone Encounter (Signed)
Called pt regarding recent blood results, recommended increasing iron tablets to daily. F/u in 1 month for repeat CBC.  ?

## 2022-03-25 NOTE — Patient Instructions (Signed)
It was great seeing you today! ? ?I am sorry you are still not feeling well.  We are going to check your urine to ensure that you have cleared your infection, and we have scheduled for an ultrasound to check your kidneys.  We are checking some blood work, and I will let you know if anything is abnormal.   ? ?I have referred you to general surgery to check on your gallbladder and potentially remove it.  You should hear back from them in the next couple of weeks to schedule an appointment.  If you do not hear back please let me know and I will check on the referral. ? ?I have also sent in for Diflucan to take once for your vaginal itching. ? ?Please check-out at the front desk before leaving the clinic. I'd like to see you back in 2 weeks for follow up but if you need to be seen earlier than that for any new issues we're happy to fit you in, just give [place] a call! ? ?Visit Reminders: ?- Stop by the pharmacy to pick up your prescriptions  ?- Continue to work on your healthy eating habits and incorporating exercise into your daily life.  ? ? ?Feel free to call with any questions or concerns at any time, at [phone]. ?  ?Take care,  ?Dr. Cora Collum ?[facility]  ?

## 2022-03-26 LAB — COMPREHENSIVE METABOLIC PANEL
ALT: 19 IU/L (ref 0–32)
AST: 12 IU/L (ref 0–40)
Albumin/Globulin Ratio: 1.1 — ABNORMAL LOW (ref 1.2–2.2)
Albumin: 4.1 g/dL (ref 3.9–5.0)
Alkaline Phosphatase: 93 IU/L (ref 44–121)
BUN/Creatinine Ratio: 12 (ref 9–23)
BUN: 14 mg/dL (ref 6–20)
Bilirubin Total: 0.3 mg/dL (ref 0.0–1.2)
CO2: 24 mmol/L (ref 20–29)
Calcium: 9.6 mg/dL (ref 8.7–10.2)
Chloride: 101 mmol/L (ref 96–106)
Creatinine, Ser: 1.13 mg/dL — ABNORMAL HIGH (ref 0.57–1.00)
Globulin, Total: 3.6 g/dL (ref 1.5–4.5)
Glucose: 83 mg/dL (ref 70–99)
Potassium: 4.7 mmol/L (ref 3.5–5.2)
Sodium: 140 mmol/L (ref 134–144)
Total Protein: 7.7 g/dL (ref 6.0–8.5)
eGFR: 68 mL/min/{1.73_m2} (ref >59)

## 2022-03-26 LAB — CBC WITH DIFFERENTIAL/PLATELET
Basophils Absolute: 0.1 10*3/uL (ref 0.0–0.2)
Basos: 1 %
EOS (ABSOLUTE): 0 10*3/uL (ref 0.0–0.4)
Eos: 1 %
Hematocrit: 32.1 % — ABNORMAL LOW (ref 34.0–46.6)
Hemoglobin: 10.9 g/dL — ABNORMAL LOW (ref 11.1–15.9)
Immature Grans (Abs): 0 10*3/uL (ref 0.0–0.1)
Immature Granulocytes: 1 %
Lymphocytes Absolute: 2 10*3/uL (ref 0.7–3.1)
Lymphs: 34 %
MCH: 26.8 pg (ref 26.6–33.0)
MCHC: 34 g/dL (ref 31.5–35.7)
MCV: 79 fL (ref 79–97)
Monocytes Absolute: 0.4 10*3/uL (ref 0.1–0.9)
Monocytes: 6 %
Neutrophils Absolute: 3.3 10*3/uL (ref 1.4–7.0)
Neutrophils: 57 %
Platelets: 579 10*3/uL — ABNORMAL HIGH (ref 150–450)
RBC: 4.06 x10E6/uL (ref 3.77–5.28)
RDW: 14 % (ref 11.7–15.4)
WBC: 5.7 10*3/uL (ref 3.4–10.8)

## 2022-03-27 ENCOUNTER — Ambulatory Visit (HOSPITAL_COMMUNITY)
Admission: RE | Admit: 2022-03-27 | Discharge: 2022-03-27 | Disposition: A | Discharge status: Home / Self Care | Payer: Medicaid Other | Source: Ambulatory Visit | Attending: Family Medicine | Admitting: Family Medicine

## 2022-03-27 DIAGNOSIS — R109 Unspecified abdominal pain: Secondary | ICD-10-CM

## 2022-03-27 LAB — URINE CULTURE

## 2022-03-28 ENCOUNTER — Encounter: Payer: Self-pay | Admitting: Family Medicine

## 2022-03-28 ENCOUNTER — Ambulatory Visit (INDEPENDENT_AMBULATORY_CARE_PROVIDER_SITE_OTHER): Payer: Medicaid Other | Admitting: Family Medicine

## 2022-03-28 ENCOUNTER — Other Ambulatory Visit (HOSPITAL_COMMUNITY)
Admission: RE | Admit: 2022-03-28 | Discharge: 2022-03-28 | Disposition: A | Discharge status: Home / Self Care | Payer: Medicaid Other | Source: Ambulatory Visit | Attending: Family Medicine | Admitting: Family Medicine

## 2022-03-28 VITALS — BP 130/82 | HR 88 | Ht 62.0 in | Wt 234.0 lb

## 2022-03-28 DIAGNOSIS — Z113 Encounter for screening for infections with a predominantly sexual mode of transmission: Secondary | ICD-10-CM

## 2022-03-28 DIAGNOSIS — N898 Other specified noninflammatory disorders of vagina: Secondary | ICD-10-CM | POA: Diagnosis present

## 2022-03-28 DIAGNOSIS — R350 Frequency of micturition: Secondary | ICD-10-CM | POA: Diagnosis not present

## 2022-03-28 DIAGNOSIS — Z0000 Encounter for general adult medical examination without abnormal findings: Secondary | ICD-10-CM | POA: Diagnosis not present

## 2022-03-28 LAB — POCT WET PREP (WET MOUNT)
Clue Cells Wet Prep Whiff POC: NEGATIVE
Trichomonas Wet Prep HPF POC: ABSENT

## 2022-03-28 MED ORDER — FLUCONAZOLE 150 MG PO TABS: 150.0000 mg | ORAL_TABLET | Freq: Once | ORAL | 0 refills | Status: AC

## 2022-03-28 NOTE — Patient Instructions (Signed)
Thank you for coming to see me today. It was a pleasure.  You may have a yeast infection so take another pill of diflucan. ? ?We performed STD testing today. This will take a few days to come back. If your MyChart is activated, we will message you on there if everything is normal, otherwise we will call. If we need to treat something we will also call you. If you do not hear from [place] in the next 4 days, please give [place] a call.  ? ?Please follow-up with [place] if you have no improvement in symptoms or go to the ER ? ?If you have any questions or concerns, please do not hesitate to call the office at [phone]. ? ?Best wishes,  ? ?Dr Posey Pronto   ?

## 2022-03-29 LAB — HIV ANTIBODY (ROUTINE TESTING W REFLEX): HIV Screen 4th Generation wRfx: NONREACTIVE

## 2022-03-29 LAB — RPR: RPR Ser Ql: NONREACTIVE

## 2022-03-29 LAB — HCV AB W REFLEX TO QUANT PCR: HCV Ab: NONREACTIVE

## 2022-03-29 LAB — HCV INTERPRETATION

## 2022-03-31 ENCOUNTER — Encounter: Payer: Self-pay | Admitting: Family Medicine

## 2022-03-31 LAB — CERVICOVAGINAL ANCILLARY ONLY
Chlamydia: NEGATIVE
Comment: NEGATIVE
Comment: NEGATIVE
Comment: NORMAL
Neisseria Gonorrhea: NEGATIVE
Trichomonas: NEGATIVE

## 2022-04-01 ENCOUNTER — Encounter: Payer: Self-pay | Admitting: Family Medicine

## 2022-04-01 DIAGNOSIS — Z0000 Encounter for general adult medical examination without abnormal findings: Secondary | ICD-10-CM | POA: Insufficient documentation

## 2022-04-01 NOTE — Progress Notes (Signed)
     SUBJECTIVE:   CHIEF COMPLAINT / HPI:   Veronica [name] is a 31 y.o. female presents for follow up   STD testing Pt presents for routine STD testing. No change in sexual partners. Denies vaginal discharge but does have some dysuria and vaginal pruritus. She thinks she may have a yeast infection. She feels overall better compared to when she was admitted to the hospital for pyelonephritis. Denies back pain, lower abdominal pain, frequency, urgency or hematuria.    Flowsheet Row Office Visit from 03/28/2022 in [facility]  PHQ-9 Total Score 3        PERTINENT  PMH / PSH: gallstones, hernia  OBJECTIVE:   BP 130/82   Pulse 88   Ht 5\' 2"  (1.575 m)   Wt 234 lb (106.1 kg)   LMP 03/12/2022 (Approximate)   SpO2 99%   BMI 42.80 kg/m    General: Alert, no acute distress Cardio: well perfused Pulm: normal work of breathing Neuro: cranial nerves grossly intact   Pelvic Exam chaperoned by CMA April        External: normal female genitalia without lesions or masses        Vagina: normal without lesions or masses        Cervix: normal without lesions or masses        Samples for Wet prep, GC/Chlamydia obtained   ASSESSMENT/PLAN:   Health maintenance examination Obtained STD and wet prep swabs for routine STD testing. Also obtained hep c, RPR and HIV labs. Preemptively prescribed diflucan as she may have a yeast infection. Safe sex practices given to pt.    [place], MD PGY-3 [facility]

## 2022-04-01 NOTE — Assessment & Plan Note (Addendum)
Obtained STD and wet prep swabs for routine STD testing. Also obtained hep c, RPR and HIV labs. Preemptively prescribed diflucan as she may have a yeast infection. Safe sex practices given to pt. ?

## 2022-04-07 ENCOUNTER — Ambulatory Visit (INDEPENDENT_AMBULATORY_CARE_PROVIDER_SITE_OTHER): Payer: Medicaid Other | Admitting: Family Medicine

## 2022-04-07 ENCOUNTER — Other Ambulatory Visit: Payer: Self-pay

## 2022-04-07 VITALS — BP 128/79 | HR 77 | Wt 233.6 lb

## 2022-04-07 DIAGNOSIS — J069 Acute upper respiratory infection, unspecified: Secondary | ICD-10-CM | POA: Diagnosis present

## 2022-04-07 DIAGNOSIS — Z419 Encounter for procedure for purposes other than remedying health state, unspecified: Secondary | ICD-10-CM | POA: Diagnosis not present

## 2022-04-07 MED ORDER — FLUTICASONE PROPIONATE 50 MCG/ACT NA SUSP: 1.0000 | Freq: Every day | NASAL | 3 refills | Status: DC | PRN

## 2022-04-07 NOTE — Patient Instructions (Addendum)
It was great seeing you today! ? ?You came in for cough and congestion which is likely due to a viral infection and allergies. I recommend using Mucinex DM as needed. Continue Zyrtec and Flonase daily which I will refill. Make sure to stay hydrated. Let me know if you start to develop fevers, the cough worsens over the next couple of weeks, or you have other new and concerning symptoms.  ? ?Visit Reminders: ?- Stop by the pharmacy to pick up your prescriptions  ?- Continue to work on your healthy eating habits and incorporating exercise into your daily life.  ? ?Feel free to call with any questions or concerns at any time, at [phone]. ?  ?Take care,  ?Dr. Cora Collum ?[facility]  ?

## 2022-04-07 NOTE — Progress Notes (Signed)
    SUBJECTIVE:   CHIEF COMPLAINT / HPI:   Ms. Althoff is a 31 yo who presents with runny nose, cough and congestion for the past week, shortly after her son started having similar symptoms. Endorses pain in her chest when she coughs. Does cough up mucous. Coughing worse at night. Tolerating oral intake. Denies fevers, nausea, vomiting, constipation, diarrhea.   OBJECTIVE:   BP 128/79   Pulse 77   Wt 233 lb 9.6 oz (106 kg)   LMP 03/12/2022 (Approximate)   SpO2 98%   BMI 42.73 kg/m    Physical exam  General: well appearing, NAD Cardiovascular: RRR, no murmurs Lungs: CTAB. Normal WOB Abdomen: soft, non-distended, non-tender Skin: warm, dry. No edema  ASSESSMENT/PLAN:   No problem-specific Assessment & Plan notes found for this encounter.   Cough, congestion Patient presents with about 1 week of rhinorrhea, cough, congestion. Reassuring that she has not had fevers, dyspnea, reduced po. Vitals stable and physical exam unremarkable with normal lung sounds. Symptoms likely due to a viral infection from child who is in daycare. Recommended supportive care with continued hydration, and Mucinex DM as needed. Advised to continue Zyrtec and Flonase daily. Strict return precautions provided    Shary Key, [place]

## 2022-04-08 ENCOUNTER — Ambulatory Visit (HOSPITAL_COMMUNITY): Payer: Medicaid Other | Admitting: Clinical

## 2022-04-09 ENCOUNTER — Ambulatory Visit (HOSPITAL_COMMUNITY): Payer: Self-pay | Admitting: Licensed Clinical Social Worker

## 2022-04-10 ENCOUNTER — Encounter (HOSPITAL_COMMUNITY): Payer: Medicaid Other | Admitting: Student in an Organized Health Care Education/Training Program

## 2022-04-17 ENCOUNTER — Encounter: Payer: Self-pay | Admitting: Family Medicine

## 2022-04-17 ENCOUNTER — Ambulatory Visit (INDEPENDENT_AMBULATORY_CARE_PROVIDER_SITE_OTHER): Payer: Medicaid Other | Admitting: Family Medicine

## 2022-04-17 ENCOUNTER — Other Ambulatory Visit: Payer: Self-pay

## 2022-04-17 DIAGNOSIS — L304 Erythema intertrigo: Secondary | ICD-10-CM | POA: Diagnosis present

## 2022-04-17 MED ORDER — NYSTATIN 100000 UNIT/GM EX CREA: 1.0000 "application " | TOPICAL_CREAM | Freq: Two times a day (BID) | CUTANEOUS | 1 refills | Status: DC

## 2022-04-17 NOTE — Progress Notes (Signed)
    SUBJECTIVE:   CHIEF COMPLAINT / HPI:   Skin Concern States that for the last week and a half she has developed skin peeling under her breasts, in groin and in between buttocks. First started under her breasts, then under her abdomen and then on her buttocks. She rubbed coco butter on it today. The area is itchy and discomforting but not painful. Seems to be spreading.   PERTINENT  PMH / PSH:  Past Medical History:  Diagnosis Date   Abdominal pain 01/06/2012   Acute tension headache 07/27/2019   Anemia    Ankle pain    Asthma    Birth control counseling 08/26/2011   BMI 40.0-44.9, adult (HCC) 02/27/2011   GERD (gastroesophageal reflux disease)    Hiatal hernia    Obesity (BMI 30-39.9)    Secondary amenorrhea 03/25/2018   OBJECTIVE:   BP 128/84   Pulse 81   Ht 5\' 2"  (1.575 m)   Wt 235 lb 6.4 oz (106.8 kg)   SpO2 99%   BMI 43.06 kg/m    General: NAD, pleasant, able to participate in exam Abdomen: Bowel sounds present, nontender, nondistended, no hepatosplenomegaly Skin: warm and dry, intertrigo under breasts, upper thighs and in between buttocks present Psych: Normal affect and mood       ASSESSMENT/PLAN:   Intertrigo Under breasts, between upper thighs, and between buttocks.  Does not appear superinfected with bacterial infection.  Will treat with topical nystatin twice daily until symptoms resolved.  Patient counseled to keep area dry as best as possible.  Return if no improvement or worsening symptoms.     , DO [facility]

## 2022-04-17 NOTE — Patient Instructions (Signed)
It was wonderful to see you today. ? ?Today we talked about: ? ?-I think you have a yeast infection. This can happen when there is excess moisture and typically flares up during warmer weather. ?-Do your best to keep these areas as dry as possible. ?-I am sending an antifungal cream to use twice a day to the areas.  Continue to use it until the areas are healed. ?-Return if no improvement ?-Do not use the Coco butter  ? ? ?Thank you for choosing [facility].  ? ?Please call [phone] with any questions about today's appointment. ? ?Please be sure to schedule follow up at the front  desk before you leave today.  ? ?Sabino Dick, DO ?PGY-2 Family Medicine   ?

## 2022-04-17 NOTE — Assessment & Plan Note (Signed)
Under breasts, between upper thighs, and between buttocks.  Does not appear superinfected with bacterial infection.  Will treat with topical nystatin twice daily until symptoms resolved.  Patient counseled to keep area dry as best as possible.  Return if no improvement or worsening symptoms. ?

## 2022-04-24 ENCOUNTER — Ambulatory Visit: Payer: Self-pay | Admitting: Surgery

## 2022-04-24 DIAGNOSIS — K8050 Calculus of bile duct without cholangitis or cholecystitis without obstruction: Secondary | ICD-10-CM | POA: Diagnosis not present

## 2022-04-24 DIAGNOSIS — R1011 Right upper quadrant pain: Secondary | ICD-10-CM | POA: Diagnosis not present

## 2022-04-24 NOTE — H&P (Signed)
Veronica [name] [id number]    Referring Provider:  Beamer, Victoria Paige,*     Subjective    Chief Complaint: new patient (Right abdominal pain)       History of Present Illness:    30-year-old woman with history of obesity, GERD, hiatal hernia, asthma, and previous C-section x2 who presents for evaluation for cholecystectomy.  She was admitted to [place] about 6 weeks ago with right flank pain and found to have both gallstones and pyelonephritis.  Bladder was treated and she is subsequently been discharged.  In the last few weeks however she has had intermittent right upper quadrant pain which can be quite severe, radiating to the epigastrium, chest, and right upper back.  This has been associated with nausea and vomiting.  She has been taking Tylenol when needed.  Does note reflux related to her known hiatal hernia.  Abdominal surgery includes C-section x2, most recently 6 months ago.  She also notes that she has been losing weight, about 60 pounds in the last 4 months, though of note she did just have a baby.       Review of Systems: A complete review of systems was obtained from the patient.  I have reviewed this information and discussed as appropriate with the patient.  See HPI as well for other ROS.     Medical History: Past Medical History         Past Medical History:  Diagnosis Date   Anemia     Anxiety     Asthma, unspecified asthma severity, unspecified whether complicated, unspecified whether persistent     GERD (gastroesophageal reflux disease)     Hypertension          There is no problem list on file for this patient.     Past Surgical History           Past Surgical History:  Procedure Laterality Date   CESAREAN SECTION       REPEAT CESAREAN SECTION       TONSILLECTOMY & ADENOIDECTOMY            Allergies           Allergies  Allergen Reactions   Ibuprofen Unknown      Was told to not take this because it would flare stomach issues   Morphine  Other (See Comments)      Causes "chest to be heavy"                   Current Outpatient Medications on File Prior to Visit  Medication Sig Dispense Refill   docusate (COLACE) 100 MG capsule Take 100 mg by mouth 2 (two) times daily       cetirizine (ZYRTEC) 5 MG tablet Take 5 mg by mouth once daily       escitalopram oxalate (LEXAPRO) 10 MG tablet Take 10 mg by mouth once daily       FEROSUL 325 mg (65 mg iron) tablet Take 325 mg by mouth every other day       FLOVENT HFA 44 mcg/actuation inhaler INHALE 2 PUFFS BY MOUTH IN THE MORNING AND AT BEDTIME       hydrOXYzine (ATARAX) 25 MG tablet Take 25 mg by mouth 3 (three) times daily as needed       pantoprazole (PROTONIX) 20 MG DR tablet TAKE 1 TABLET BY MOUTH TWICE DAILY ( TAKE IF PEPCID DOSE NOT WORK )       SPRINTEC, 28, 0.25-35 mg-mcg   tablet Take 1 tablet by mouth once daily        No current facility-administered medications on file prior to visit.      Family History           Family History  Problem Relation Age of Onset   High blood pressure (Hypertension) Mother     Coronary Artery Disease (Blocked arteries around heart) Mother     Diabetes Mother     Obesity Father     Obesity Sister     Obesity Brother          Social History         Tobacco Use  Smoking Status Never  Smokeless Tobacco Never      Social History  Social History           Socioeconomic History   Marital status: Single  Tobacco Use   Smoking status: Never   Smokeless tobacco: Never  Vaping Use   Vaping Use: Never used  Substance and Sexual Activity   Alcohol use: Yes   Drug use: Never        Objective:           Vitals:    04/24/22 1021  BP: 118/78  Pulse: 103  Temp: 36.6 C (97.9 F)  SpO2: 98%  Weight: (!) 107 kg (236 lb)  Height: 157.5 cm (5' 2")    Body mass index is 43.16 kg/m.   Alert and well-appearing Unlabored respirations   Assessment and Plan:  Diagnoses and all orders for this visit:   Right upper  quadrant abdominal pain   Biliary colic     Gallstones noted on ultrasound in early April, and symptoms are classic for biliary etiology. I recommend proceeding with laparoscopic or robotic cholecystectomy with possible cholangiogram.  Discussed the relevant anatomy using a diagram to demonstrate, and went over surgical technique.  Discussed risks of surgery including bleeding, pain, scarring, intraabdominal injury specifically to the common bile duct and sequelae, bile leak, conversion to open surgery, subtotal cholecystectomy, failure to resolve symptoms, blood clots/ pulmonary embolus, heart attack, pneumonia, stroke, death. Questions welcomed and answered to patient's satisfaction.  We will schedule as soon as possible.   Dejuan Elman AMANDA Anabell Swint, MD  

## 2022-04-28 ENCOUNTER — Ambulatory Visit: Payer: Medicaid Other

## 2022-04-28 NOTE — Patient Instructions (Addendum)
DUE TO COVID-19 ONLY TWO VISITORS  (aged 30 and older)  ARE ALLOWED TO COME WITH YOU AND STAY IN THE WAITING ROOM ONLY DURING PRE OP AND PROCEDURE.   **NO VISITORS ARE ALLOWED IN THE SHORT STAY AREA OR RECOVERY ROOM!!**  IF YOU WILL BE ADMITTED INTO THE HOSPITAL YOU ARE ALLOWED ONLY FOUR SUPPORT PEOPLE DURING VISITATION HOURS ONLY (7 AM -8PM)   The support person(s) must pass our screening, gel in and out, and wear a mask at all times, including in the patient's room. Patients must also wear a mask when staff or their support person are in the room. Visitors GUEST BADGE MUST BE WORN VISIBLY  One adult visitor may remain with you overnight and MUST be in the room by 8 P.M.     Your procedure is scheduled on: 05/07/22   Report to [facility] Main Entrance    Report to admitting at  8:45 AM   Call this number if you have problems the morning of surgery [phone]   Do not eat food or drink :After Midnight.           If you have questions, please contact your surgeon's office.    Oral Hygiene is also important to reduce your risk of infection.                                    Remember - BRUSH YOUR TEETH THE MORNING OF SURGERY WITH YOUR REGULAR TOOTHPASTE   Take these medicines the morning of surgery with A SIP OF WATER: Lexapro, Pantoprazole, Zyrtec. Use your inhalers and bring them with you                              You may not have any metal on your body including hair pins, jewelry, and body piercing             Do not wear make-up, lotions, powders, perfumes, or deodorant  Do not wear nail polish including gel and S&S, artificial/acrylic nails, or any other type of covering on natural nails including finger and toenails. If you have artificial nails, gel coating, etc. that needs to be removed by a nail salon please have this removed prior to surgery or surgery may need to be canceled/ delayed if the surgeon/ anesthesia feels like they are unable to be safely monitored.    Do not shave  48 hours prior to surgery.     Do not bring valuables to the hospital. [place]    Patients discharged on the day of surgery will not be allowed to drive home.  Someone NEEDS to stay with you for the first 24 hours after anesthesia.              Please read over the following fact sheets you were given: IF YOU HAVE QUESTIONS ABOUT YOUR PRE-OP INSTRUCTIONS PLEASE CALL [phone]     [facility] - Preparing for Surgery Before surgery, you can play an important role.  Because skin is not sterile, your skin needs to be as free of germs as possible.  You can reduce the number of germs on your skin by washing with CHG (chlorahexidine gluconate) soap before surgery.  CHG is an antiseptic cleaner which kills  germs and bonds with the skin to continue killing germs even after washing. Please DO NOT use if you have an allergy to CHG or antibacterial soaps.  If your skin becomes reddened/irritated stop using the CHG and inform your nurse when you arrive at Short Stay. Do not shave (including legs and underarms) for at least 48 hours prior to the first CHG shower.   Please follow these instructions carefully:  1.  Shower with CHG Soap the night before surgery and the  morning of Surgery.  2.  If you choose to wash your hair, wash your hair first as usual with your  normal  shampoo.  3.  After you shampoo, rinse your hair and body thoroughly to remove the  shampoo.                            4.  Use CHG as you would any other liquid soap.  You can apply chg directly  to the skin and wash                       Gently with a scrungie or clean washcloth.  5.  Apply the CHG Soap to your body ONLY FROM THE NECK DOWN.   Do not use on face/ open                           Wound or open sores. Avoid contact with eyes, ears mouth and genitals (private parts).                       Wash face,  Genitals (private parts) with your normal soap.              6.  Wash thoroughly, paying special attention to the area where your surgery  will be performed.  7.  Thoroughly rinse your body with warm water from the neck down.  8.  DO NOT shower/wash with your normal soap after using and rinsing off  the CHG Soap.                9.  Pat yourself dry with a clean towel.            10.  Wear clean pajamas.            11.  Place clean sheets on your bed the night of your first shower and do not  sleep with pets. Day of Surgery : Do not apply any lotions/deodorants the morning of surgery.  Please wear clean clothes to the hospital/surgery center.  FAILURE TO FOLLOW THESE INSTRUCTIONS MAY RESULT IN THE CANCELLATION OF YOUR SURGERY    ________________________________________________________________________  [facility] - Preparing for Surgery Before surgery, you can play an important role.  Because skin is not sterile, your skin needs to be as free of germs as possible.  You can reduce the number of germs on your skin by washing with CHG (chlorahexidine gluconate) soap before surgery.  CHG is an antiseptic cleaner which kills germs and bonds with the skin to continue killing germs even after washing. Please DO NOT use if you have an allergy to CHG or antibacterial soaps.  If your skin becomes reddened/irritated stop using the CHG and inform your nurse when you arrive at Short Stay. Do not shave (including legs and underarms) for at least 48 hours prior to the first  CHG shower.  You may shave your face/neck.  Please follow these instructions carefully:  1.  Shower with CHG Soap the night before surgery and the  morning of surgery.  2.  If you choose to wash your hair, wash your hair first as usual with your normal  shampoo.  3.  After you shampoo, rinse your hair and body thoroughly to remove the shampoo.                             4.  Use CHG as you would any other liquid soap.  You can apply chg directly to the skin and wash.  Gently with a scrungie or  clean washcloth.  5.  Apply the CHG Soap to your body ONLY FROM THE NECK DOWN.   Do   not use on face/ open                           Wound or open sores. Avoid contact with eyes, ears mouth and   genitals (private parts).                       Wash face,  Genitals (private parts) with your normal soap.             6.  Wash thoroughly, paying special attention to the area where your    surgery  will be performed.  7.  Thoroughly rinse your body with warm water from the neck down.  8.  DO NOT shower/wash with your normal soap after using and rinsing off the CHG Soap.                9.  Pat yourself dry with a clean towel.            10.  Wear clean pajamas.            11.  Place clean sheets on your bed the night of your first shower and do not  sleep with pets. Day of Surgery : Do not apply any lotions/deodorants the morning of surgery.  Please wear clean clothes to the hospital/surgery center.  FAILURE TO FOLLOW THESE INSTRUCTIONS MAY RESULT IN THE CANCELLATION OF YOUR SURGERY  PATIENT SIGNATURE_________________________________  NURSE SIGNATURE__________________________________  ________________________________________________________________________

## 2022-04-29 ENCOUNTER — Ambulatory Visit: Payer: Medicaid Other | Admitting: Emergency Medicine

## 2022-04-29 ENCOUNTER — Ambulatory Visit (INDEPENDENT_AMBULATORY_CARE_PROVIDER_SITE_OTHER): Payer: Medicaid Other

## 2022-04-29 DIAGNOSIS — Z111 Encounter for screening for respiratory tuberculosis: Secondary | ICD-10-CM

## 2022-04-29 NOTE — Progress Notes (Signed)
Tuberculin skin test applied to left ventral forearm.  Patient to return on 5/25 to have site read.  Reminder card given.

## 2022-04-30 NOTE — Progress Notes (Signed)
COVID Vaccine Completed: yes x2  Date of COVID positive in last 90 days:  PCP - Sonia Side, DO Cardiologist - Candee Furbish, MD  Chest x-ray - 03/16/22 epic EKG - 03/11/22 Epic Stress Test -  ECHO -  Cardiac Cath -  Pacemaker/ICD device last checked: Spinal Cord Stimulator:  Bowel Prep -   Sleep Study -  CPAP -   Fasting Blood Sugar -  Checks Blood Sugar _____ times a day  Blood Thinner Instructions: Aspirin Instructions: Last Dose:  Activity level:  Can go up a flight of stairs and perform activities of daily living without stopping and without symptoms of chest pain or shortness of breath.   Able to exercise without symptoms  Unable to go up a flight of stairs without symptoms of      Anesthesia review:   Patient denies shortness of breath, fever, cough and chest pain at PAT appointment   Patient verbalized understanding of instructions that were given to them at the PAT appointment. Patient was also instructed that they will need to review over the PAT instructions again at home before surgery.

## 2022-05-01 ENCOUNTER — Ambulatory Visit: Payer: Medicaid Other

## 2022-05-01 ENCOUNTER — Encounter (HOSPITAL_COMMUNITY): Payer: Self-pay

## 2022-05-01 ENCOUNTER — Encounter (HOSPITAL_COMMUNITY)
Admission: RE | Admit: 2022-05-01 | Discharge: 2022-05-01 | Disposition: A | Discharge status: Home / Self Care | Payer: Medicaid Other | Source: Ambulatory Visit | Attending: Surgery | Admitting: Surgery

## 2022-05-01 DIAGNOSIS — Z111 Encounter for screening for respiratory tuberculosis: Secondary | ICD-10-CM

## 2022-05-01 DIAGNOSIS — N179 Acute kidney failure, unspecified: Secondary | ICD-10-CM | POA: Diagnosis not present

## 2022-05-01 DIAGNOSIS — Z01812 Encounter for preprocedural laboratory examination: Secondary | ICD-10-CM | POA: Diagnosis present

## 2022-05-01 DIAGNOSIS — Z01818 Encounter for other preprocedural examination: Secondary | ICD-10-CM

## 2022-05-01 HISTORY — DX: Unspecified pre-eclampsia, unspecified trimester: O14.90

## 2022-05-01 HISTORY — DX: Cardiac murmur, unspecified: R01.1

## 2022-05-01 HISTORY — DX: Anxiety disorder, unspecified: F41.9

## 2022-05-01 LAB — BASIC METABOLIC PANEL
Anion gap: 8 (ref 5–15)
BUN: 13 mg/dL (ref 6–20)
CO2: 26 mmol/L (ref 22–32)
Calcium: 9.4 mg/dL (ref 8.9–10.3)
Chloride: 107 mmol/L (ref 98–111)
Creatinine, Ser: 1.14 mg/dL — ABNORMAL HIGH (ref 0.44–1.00)
GFR, Estimated: >60 mL/min (ref >60)
Glucose, Bld: 60 mg/dL — ABNORMAL LOW (ref 70–99)
Potassium: 4.2 mmol/L (ref 3.5–5.1)
Sodium: 141 mmol/L (ref 135–145)

## 2022-05-01 LAB — CBC
HCT: 37.3 % (ref 36.0–46.0)
Hemoglobin: 12.2 g/dL (ref 12.0–15.0)
MCH: 27.3 pg (ref 26.0–34.0)
MCHC: 32.7 g/dL (ref 30.0–36.0)
MCV: 83.4 fL (ref 80.0–100.0)
Platelets: 472 10*3/uL — ABNORMAL HIGH (ref 150–400)
RBC: 4.47 MIL/uL (ref 3.87–5.11)
RDW: 15.1 % (ref 11.5–15.5)
WBC: 5.1 10*3/uL (ref 4.0–10.5)
nRBC: 0 % (ref 0.0–0.2)

## 2022-05-01 LAB — TB SKIN TEST
Induration: 0 mm
TB Skin Test: NEGATIVE

## 2022-05-01 NOTE — Progress Notes (Signed)
PPD Reading Note PPD read and results entered in EpicCare. Result: 0 mm induration. Interpretation: Negative Allergic reaction: No  

## 2022-05-02 ENCOUNTER — Encounter (HOSPITAL_COMMUNITY): Payer: Self-pay

## 2022-05-02 ENCOUNTER — Ambulatory Visit (HOSPITAL_COMMUNITY)
Admission: EM | Admit: 2022-05-02 | Discharge: 2022-05-02 | Disposition: A | Discharge status: Home / Self Care | Payer: Medicaid Other | Attending: Emergency Medicine | Admitting: Emergency Medicine

## 2022-05-02 DIAGNOSIS — N949 Unspecified condition associated with female genital organs and menstrual cycle: Secondary | ICD-10-CM | POA: Diagnosis not present

## 2022-05-02 LAB — POCT URINALYSIS DIPSTICK, ED / UC
Bilirubin Urine: NEGATIVE
Glucose, UA: NEGATIVE mg/dL
Hgb urine dipstick: NEGATIVE
Ketones, ur: NEGATIVE mg/dL
Nitrite: NEGATIVE
Protein, ur: 100 mg/dL — AB
Specific Gravity, Urine: 1.015 (ref 1.005–1.030)
Urobilinogen, UA: 0.2 mg/dL (ref 0.0–1.0)
pH: 6.5 (ref 5.0–8.0)

## 2022-05-02 MED ORDER — MICONAZOLE NITRATE 2 % EX CREA: 1.0000 "application " | TOPICAL_CREAM | Freq: Two times a day (BID) | CUTANEOUS | 0 refills | Status: DC

## 2022-05-02 MED ORDER — LIDOCAINE 5 % EX OINT: 1.0000 "application " | TOPICAL_OINTMENT | CUTANEOUS | 0 refills | Status: DC | PRN

## 2022-05-02 NOTE — ED Provider Notes (Signed)
[facility]    CSN: [id number] Arrival date & time: 05/02/22  4403      History   Chief Complaint No chief complaint on file.   HPI Ardean Simonich Majid is a 31 y.o. female.   Patient presents with dysuria, vaginal irritation and itching and urinary incontinence for 3 days.  Symptoms began in the last day of her menses which she is described as normal presentation.  Has been having to wear pads due to the increased leakage.  Has not attempted treatment of symptoms.  Sexually active.  Denies hematuria, urinary frequency, urgency, lower abdominal pain or pressure, flank pain, fever, chills, new rash or lesions.   Past Medical History:  Diagnosis Date   Abdominal pain 01/06/2012   Acute tension headache 07/27/2019   Anemia    Ankle pain    Anxiety    Asthma    Birth control counseling 08/26/2011   BMI 40.0-44.9, adult (HCC) 02/27/2011   GERD (gastroesophageal reflux disease)    Heart murmur    Hiatal hernia    Obesity (BMI 30-39.9)    Pre-eclampsia    off med since 4/23   Secondary amenorrhea 03/25/2018    Patient Active Problem List   Diagnosis Date Noted   Intertrigo 04/17/2022   Health maintenance examination 04/01/2022   Nocturnal oxygen desaturation 03/23/2022   AKI (acute kidney injury) (HCC) 03/23/2022   Gallstones    Sepsis (HCC)    Pyelonephritis 03/10/2022   Allergic rhinitis 03/07/2022   Asthma 08/31/2020   Back pain 02/13/2020   Hiatal hernia 11/02/2019   Epigastric pain 09/13/2019   Subclinical hypothyroidism 05/19/2019   GAD (generalized anxiety disorder) 05/09/2019   Chronic cough 10/16/2017   BMI 50.0-59.9, adult (HCC) 02/27/2011   Anemia 02/27/2011    Past Surgical History:  Procedure Laterality Date   CESAREAN SECTION  09/02/2010   For macrosomia, but son was 7 lb 12 oz   CESAREAN SECTION N/A 10/06/2021   Procedure: CESAREAN SECTION;  Surgeon: Bellefonte Bing, MD;  Location: [facility];  Service: Obstetrics;  Laterality: N/A;    TONSILLECTOMY AND ADENOIDECTOMY      OB History     Gravida  2   Para  2   Term  2   Preterm      AB      Living  2      SAB      IAB      Ectopic      Multiple  0   Live Births  2            Home Medications    Prior to Admission medications   Medication Sig Start Date End Date Taking? Authorizing Provider  acetaminophen (TYLENOL) 500 MG tablet Take 2 tablets (1,000 mg total) by mouth every 6 (six) hours as needed for moderate pain. 10/09/21   Cheral Marker, CNM  albuterol (VENTOLIN HFA) 108 (90 Base) MCG/ACT inhaler INHALE 2 PUFFS BY MOUTH EVERY 4 HOURS AS NEEDED FOR WHEEZING OR SHORTNESS OF BREATH (COUGH). 10/15/20   Allayne Stack, DO  cetirizine (ZYRTEC) 10 MG tablet Take 10 mg by mouth daily.    [provider]  docusate sodium (COLACE) 100 MG capsule Take 1 capsule (100 mg total) by mouth 2 (two) times daily. Patient taking differently: Take 100 mg by mouth daily as needed for moderate constipation. 06/01/21   Gerrit Heck, CNM  escitalopram (LEXAPRO) 10 MG tablet Take 1 tablet (10 mg total) by  mouth daily. 02/05/22   Cora Collum, DO  ferrous sulfate 325 (65 FE) MG tablet Take 1 tablet (325 mg total) by mouth every other day. 12/19/21   Cora Collum, DO  fluticasone (FLONASE) 50 MCG/ACT nasal spray Place 1 spray into both nostrils daily as needed for allergies or rhinitis. 1 spray in each nostril every day Patient taking differently: Place 1 spray into both nostrils daily. 1 spray in each nostril every day 04/07/22   Cora Collum, DO  fluticasone (FLOVENT HFA) 110 MCG/ACT inhaler Inhale 2 puffs into the lungs 2 (two) times daily. 12/20/21   Littie Deeds, MD  hydrOXYzine (ATARAX) 25 MG tablet Take 1 tablet (25 mg total) by mouth 3 (three) times daily as needed for anxiety. 01/30/22   Lauro Franklin, MD  Multiple Vitamins-Minerals (ONE-A-DAY WOMENS PO) Take 1 tablet by mouth daily.    [provider]  norgestimate-ethinyl  estradiol (SPRINTEC 28) 0.25-35 MG-MCG tablet Take 1 tablet by mouth daily. 12/19/21   Cora Collum, DO  nystatin cream (MYCOSTATIN) Apply 1 application. topically 2 (two) times daily. 04/17/22   Sabino Dick, DO  oxyCODONE (OXY IR/ROXICODONE) 5 MG immediate release tablet Take 1 tablet (5 mg total) by mouth every 4 (four) hours as needed for moderate pain or severe pain. 03/14/22   Alfredo Martinez, MD  pantoprazole (PROTONIX) 20 MG tablet Take 20 mg by mouth daily. 02/20/22   [provider]  polyethylene glycol (MIRALAX / GLYCOLAX) 17 g packet Take 17 g by mouth daily. Patient taking differently: Take 17 g by mouth daily as needed for moderate constipation. 06/01/21   Gerrit Heck, CNM  Prenatal Vit-Fe Fumarate-FA (PRENATAL COMPLETE) 14-0.4 MG TABS Take 1 tablet by mouth daily. Patient not taking: Reported on 04/28/2022 02/03/21   Linwood Dibbles, MD  Semaglutide,0.25 or 0.5MG /DOS, (OZEMPIC, 0.25 OR 0.5 MG/DOSE,) 2 MG/1.5ML SOPN Inject 0.25 mg into the skin once a week. Patient not taking: Reported on 03/10/2022 02/25/22   Cora Collum, DO  senna-docusate (SENOKOT-S) 8.6-50 MG tablet Take 1 tablet by mouth daily. Patient not taking: Reported on 04/28/2022 03/15/22   Alfredo Martinez, MD    Family History Family History  Problem Relation Age of Onset   Diabetes Mother    Hypertension Mother    Obesity Mother    Heart attack Mother 31       Death   Obesity Sister    Osteochondroma Sister    Diabetes Sister    Hypertension Sister     Social History Social History   Tobacco Use   Smoking status: Never   Smokeless tobacco: Never  Vaping Use   Vaping Use: Never used  Substance Use Topics   Alcohol use: Yes    Comment: occ   Drug use: Not Currently    Comment: last used in teens     Allergies   Morphine and related and Motrin [ibuprofen]   Review of Systems Review of Systems  Constitutional: Negative.   Respiratory: Negative.    Genitourinary:  Positive for dysuria  and vaginal pain. Negative for decreased urine volume, difficulty urinating, dyspareunia, enuresis, flank pain, frequency, genital sores, hematuria, menstrual problem, pelvic pain, urgency, vaginal bleeding and vaginal discharge.  Skin: Negative.   Neurological: Negative.     Physical Exam Triage Vital Signs ED Triage Vitals [05/02/22 0827]  Enc Vitals Group     BP 138/83     Pulse Rate 71     Resp 16  Temp 98.4 F (36.9 C)     Temp Source Oral     SpO2 98 %     Weight      Height      Head Circumference      Peak Flow      Pain Score      Pain Loc      Pain Edu?      Excl. in GC?    No data found.  Updated Vital Signs BP 138/83 (BP Location: Left Arm)   Pulse 71   Temp 98.4 F (36.9 C) (Oral)   Resp 16   LMP 04/25/2022 (Exact Date)   SpO2 98%   Visual Acuity Right Eye Distance:   Left Eye Distance:   Bilateral Distance:    Right Eye Near:   Left Eye Near:    Bilateral Near:     Physical Exam Constitutional:      Appearance: Normal appearance.  HENT:     Head: Normocephalic.  Eyes:     Extraocular Movements: Extraocular movements intact.  Pulmonary:     Effort: Pulmonary effort is normal.  Genitourinary:    Comments: A thin yellowish to brown discharge can be noted leaking from the vaginal area and within the vaginal canal, no abnormalities to the labia, urethra, clitoris, no rash or lesions noted Neurological:     Mental Status: She is alert and oriented to person, place, and time. Mental status is at baseline.  Psychiatric:        Mood and Affect: Mood normal.        Behavior: Behavior normal.     UC Treatments / Results  Labs (all labs ordered are listed, but only abnormal results are displayed) Labs Reviewed  POCT URINALYSIS DIPSTICK, ED / UC - Abnormal; Notable for the following components:      Result Value   Protein, ur 100 (*)    Leukocytes,Ua MODERATE (*)    All other components within normal limits    EKG   Radiology No  results found.  Procedures Procedures (including critical care time)  Medications Ordered in UC Medications - No data to display  Initial Impression / Assessment and Plan / UC Course  I have reviewed the triage vital signs and the nursing notes.  Pertinent labs & imaging results that were available during my care of the patient were reviewed by me and considered in my medical decision making (see chart for details).  Vaginal discomfort  Discharge is not consistent with typical presentation of STI and have it is present in the vaginal canal I do not believe it to be urine, discussed with patient, urinalysis negative for infection, STI labs are pending, will treat per protocol, discussed with patient, advised abstinence until lab results and symptoms have resolved, prescribed lidocaine and miconazole for general comfort, given walker referral to gynecology if all testing today negative and symptoms continue to persist, work note given Final Clinical Impressions(s) / UC Diagnoses   Final diagnoses:  None   Discharge Instructions   None    ED Prescriptions   None    PDMP not reviewed this encounter.   Valinda HoarWhite, Tristine Langi R, [place]NP 05/02/22 [phone]

## 2022-05-02 NOTE — ED Triage Notes (Signed)
C/o vaginal discharge and burning x 2-3 days.

## 2022-05-02 NOTE — Discharge Instructions (Addendum)
Today I am unsure the cause of your current symptoms, on exam I was able to see the leakage that you are concerned about present in your vaginal canal therefore most likely this is not urine, therefore we will try to minimize your symptoms until all lab work has returned  You may use lidocaine ointment on the external vaginal area as needed for general comfort  You may use miconazole cream on the external vaginal area twice daily for general comfort  Urinalysis negative  Labs pending 2-3 days, you will be contacted if positive for any sti and treatment will be sent to the pharmacy, you will have to return to the clinic if positive for gonorrhea to receive treatment   Please refrain from having sex until labs results, if positive please refrain from having sex until treatment complete and symptoms resolve   If positive for  Chlamydia  gonorrhea or trichomoniasis please notify partner or partners so they may tested as well  Moving forward, it is recommended you use some form of protection against the transmission of sti infections  such as condoms or dental dams with each sexual encounter    If All lab work today comes back negative, follow-up with urgent care or a gynecologist for further evaluation, you have been given information to [facility] if you do not have a gynecologist established

## 2022-05-03 ENCOUNTER — Ambulatory Visit (HOSPITAL_COMMUNITY)
Admission: EM | Admit: 2022-05-03 | Discharge: 2022-05-03 | Discharge status: Absent without leave | Payer: Medicaid Other

## 2022-05-06 ENCOUNTER — Telehealth (HOSPITAL_COMMUNITY): Payer: Self-pay

## 2022-05-06 ENCOUNTER — Telehealth: Payer: Self-pay

## 2022-05-06 ENCOUNTER — Telehealth (HOSPITAL_COMMUNITY): Payer: Self-pay | Admitting: Emergency Medicine

## 2022-05-06 LAB — CERVICOVAGINAL ANCILLARY ONLY
Bacterial Vaginitis (gardnerella): NEGATIVE
Candida Glabrata: NEGATIVE
Candida Vaginitis: NEGATIVE
Chlamydia: NEGATIVE
Comment: NEGATIVE
Comment: NEGATIVE
Comment: NEGATIVE
Comment: NEGATIVE
Comment: NEGATIVE
Comment: NORMAL
Neisseria Gonorrhea: NEGATIVE
Trichomonas: POSITIVE — AB

## 2022-05-06 MED ORDER — METRONIDAZOLE 500 MG PO TABS: 500.0000 mg | ORAL_TABLET | Freq: Two times a day (BID) | ORAL | 0 refills | Status: DC

## 2022-05-06 NOTE — Telephone Encounter (Signed)
Patient calls nurse line in regards to UC results  visible in mychart.   Patient positive for trichomonas. Patient counseled on infection and need for treatment. Patient advised to refrain from activity until all parties are treated.   Patient advised to reach out to [facility] for treatment.   Patient calls back reporting she spoke with someone there and they advised her to get "a'" medication OTC.   Patient is requesting treatment from PCP.   Will forward to PCP.

## 2022-05-06 NOTE — Telephone Encounter (Signed)
Pt tested positive for Trich and will need to be treated with Flagyl.

## 2022-05-07 ENCOUNTER — Telehealth (HOSPITAL_COMMUNITY): Payer: Self-pay | Admitting: Emergency Medicine

## 2022-05-07 ENCOUNTER — Ambulatory Visit (HOSPITAL_COMMUNITY): Admission: RE | Admit: 2022-05-07 | Payer: Medicaid Other | Source: Home / Self Care | Admitting: Surgery

## 2022-05-07 ENCOUNTER — Other Ambulatory Visit: Payer: Self-pay | Admitting: Family Medicine

## 2022-05-07 ENCOUNTER — Encounter (HOSPITAL_COMMUNITY): Admission: RE | Payer: Self-pay | Source: Home / Self Care

## 2022-05-07 DIAGNOSIS — Z01818 Encounter for other preprocedural examination: Secondary | ICD-10-CM

## 2022-05-07 DIAGNOSIS — N179 Acute kidney failure, unspecified: Secondary | ICD-10-CM

## 2022-05-07 SURGERY — LAPAROSCOPIC CHOLECYSTECTOMY
Anesthesia: General

## 2022-05-07 MED ORDER — METRONIDAZOLE 500 MG PO TABS: 500.0000 mg | ORAL_TABLET | Freq: Two times a day (BID) | ORAL | 0 refills | Status: DC

## 2022-05-07 NOTE — Telephone Encounter (Signed)
Opened in error, medication already sent 

## 2022-05-07 NOTE — Progress Notes (Signed)
Patient called nurse line for treatment for trichomonas that was tested at urgent care.  She was not prescribed medication, and requests it from me.  Ordered Flagyl 500 mg twice daily for 7 days.  Avoiding intercourse until both parties treated was already discussed by RN.

## 2022-05-08 DIAGNOSIS — Z419 Encounter for procedure for purposes other than remedying health state, unspecified: Secondary | ICD-10-CM | POA: Diagnosis not present

## 2022-05-09 ENCOUNTER — Ambulatory Visit (HOSPITAL_BASED_OUTPATIENT_CLINIC_OR_DEPARTMENT_OTHER): Payer: Medicaid Other | Admitting: Internal Medicine

## 2022-05-13 ENCOUNTER — Encounter: Payer: Self-pay | Admitting: *Deleted

## 2022-05-14 ENCOUNTER — Encounter: Payer: Self-pay | Admitting: Family Medicine

## 2022-05-14 ENCOUNTER — Ambulatory Visit (INDEPENDENT_AMBULATORY_CARE_PROVIDER_SITE_OTHER): Payer: Medicaid Other | Admitting: Family Medicine

## 2022-05-14 ENCOUNTER — Other Ambulatory Visit: Payer: Self-pay

## 2022-05-14 DIAGNOSIS — Z309 Encounter for contraceptive management, unspecified: Secondary | ICD-10-CM

## 2022-05-14 NOTE — Progress Notes (Signed)
    SUBJECTIVE:   CHIEF COMPLAINT / HPI:   Ms. Veronica [name] is a 31 yo who presents to discuss Nexplanon.   Patient states she is feeling well, has been working out every morning. Has not had any anxiety flares since starting Lexapro. Was prescribed 10mg . Has been cutting it in half which is working for her. Feels anxiety is much improved . Has been staying busy.   States since stopping her blood pressure medication her BP has been good. 128/79 last night after walking around. This morning 122/79  Tested positive for Trichomonas on 5/26. Prescribed Flagyl. Took last pill yesterday.  Vaginal itching and burning is gone. Has been taking her OCP every day but said she has been having slip ups of unprotected sex. Declines any missed OCP doses.   Upcoming appointments: July 10th will have gallbladder removed  Sleep study scheduled for 7/2  PERTINENT  PMH / PSH: Reviewed   OBJECTIVE:   BP 123/63   Pulse 83   Wt 240 lb 8 oz (109.1 kg)   LMP 04/25/2022 (Exact Date)   SpO2 99%   BMI 43.99 kg/m    Physical exam General: well appearing, NAD Cardiovascular: RRR, no murmurs Lungs: CTAB. Normal WOB Abdomen: soft, non-distended, non-tender Skin: warm, dry. No edema  ASSESSMENT/PLAN:   Contraception management Patient has been using OCPs and taking them daily but she would like a more permanent option. Dicussed options in detail and she is opting for Nexplanon. Discussed common side effects including intermittent spotting/bleeding. Patient would still like to proceed. Scheduled 6/12 appt for Nexplanon insertion and advised her to arrive a few minutes early. Questions and concerns addressed    Veronica [name], [place]

## 2022-05-14 NOTE — Patient Instructions (Signed)
It was great seeing you today!  I am glad to see your anxiety is doing a lot better on the Lexapro.  Continue taking the half pill since it is working well for you.  I have scheduled you for your Nexplanon insertion on 6/12 and as we discussed arrive 15 minutes early for that we have time.  We will be able to discuss anything else since that will take the entire time.  For your neck pain is likely muscle strain from sleeping on it wrong or moving in the wrong direction.  Continue to do exercises and stretching your neck and you can alternate between ice and heat as well.  You can take Tylenol as needed for the pain.  Schedule appointment to be seen if the pain worsens despite doing these things over the next 1 to 2 weeks.  Feel free to call with any questions or concerns at any time, at [phone].   Take care,  Dr. Shary Key [facility]

## 2022-05-19 ENCOUNTER — Other Ambulatory Visit: Payer: Self-pay

## 2022-05-19 ENCOUNTER — Encounter: Payer: Self-pay | Admitting: Family Medicine

## 2022-05-19 ENCOUNTER — Ambulatory Visit (INDEPENDENT_AMBULATORY_CARE_PROVIDER_SITE_OTHER): Payer: Medicaid Other | Admitting: Family Medicine

## 2022-05-19 VITALS — BP 112/62 | HR 64 | Wt 239.6 lb

## 2022-05-19 DIAGNOSIS — Z3046 Encounter for surveillance of implantable subdermal contraceptive: Secondary | ICD-10-CM | POA: Diagnosis not present

## 2022-05-19 DIAGNOSIS — Z30019 Encounter for initial prescription of contraceptives, unspecified: Secondary | ICD-10-CM | POA: Diagnosis not present

## 2022-05-19 DIAGNOSIS — Z309 Encounter for contraceptive management, unspecified: Secondary | ICD-10-CM

## 2022-05-19 DIAGNOSIS — Z30017 Encounter for initial prescription of implantable subdermal contraceptive: Secondary | ICD-10-CM | POA: Diagnosis present

## 2022-05-19 DIAGNOSIS — D649 Anemia, unspecified: Secondary | ICD-10-CM

## 2022-05-19 LAB — POCT URINE PREGNANCY: Preg Test, Ur: NEGATIVE

## 2022-05-19 LAB — POCT URINALYSIS DIP (MANUAL ENTRY)
Bilirubin, UA: NEGATIVE
Blood, UA: NEGATIVE
Glucose, UA: NEGATIVE mg/dL
Ketones, POC UA: NEGATIVE mg/dL
Leukocytes, UA: NEGATIVE
Nitrite, UA: NEGATIVE
Protein Ur, POC: NEGATIVE mg/dL
Spec Grav, UA: 1.015 (ref 1.010–1.025)
Urobilinogen, UA: 0.2 E.U./dL
pH, UA: 7.5 (ref 5.0–8.0)

## 2022-05-19 MED ORDER — FERROUS SULFATE 325 (65 FE) MG PO TABS: 325.0000 mg | ORAL_TABLET | ORAL | 1 refills | Status: DC

## 2022-05-19 NOTE — Patient Instructions (Signed)
  Nexplanon Instructions After Insertion  Keep bandage clean and dry for 24 hours  May use ice/Tylenol/Ibuprofen for soreness or pain  If you develop fever, drainage or increased warmth from incision site-contact office immediately   Feel free to call with any questions or concerns at any time, at [phone].   Take care,  Dr. Cora Collum  [facility]

## 2022-05-19 NOTE — Assessment & Plan Note (Signed)
Patient has been using OCPs and taking them daily but she would like a more permanent option. Dicussed options in detail and she is opting for Nexplanon. Discussed common side effects including intermittent spotting/bleeding. Patient would still like to proceed. Scheduled 6/12 appt for Nexplanon insertion and advised her to arrive a few minutes early. Questions and concerns addressed

## 2022-05-21 NOTE — Progress Notes (Signed)
    SUBJECTIVE:   CHIEF COMPLAINT / HPI:   Patient presents for Nexplanon insertion. Has been on OCPs, taking them daily without missing doses, but would like a longer acting option. Has had Nexplanon several years ago and it worked well for her.  Discussed risks and benefits of procedure, common side effects such as irregular bleeding/spotting, and all questions answered.   PERTINENT  PMH / PSH: Reviewed  OBJECTIVE:   BP 112/62   Pulse 64   Wt 239 lb 9.6 oz (108.7 kg)   LMP 04/25/2022 (Exact Date)   SpO2 94%   BMI 43.82 kg/m    Physical exam General: well appearing, NAD Lungs: Normal WOB Abdomen: soft, non-distended, non-tender Skin: warm, dry. No edema  ASSESSMENT/PLAN:   No problem-specific Assessment & Plan notes found for this encounter.    Nexplanon Insertion Procedure Patient was given informed consent, she signed consent form.  Patient does understand that irregular bleeding is a very common side effect of this medication. She was advised to have backup contraception for one week after placement. Pregnancy test in clinic today was negative.  Appropriate time out taken.  Patient's right arm was prepped and draped in the usual sterile fashion.. The ruler used to measure and mark insertion area.  Patient was prepped with alcohol swab and then injected with 3 ml of 1% lidocaine with epi.  She was prepped with betadine, Nexplanon removed from packaging,  Device confirmed in needle, then inserted full length of needle and withdrawn per handbook instructions. Nexplanon was able to palpated in the patient's arm; patient palpated the insert herself. There was minimal blood loss.  Patient insertion site covered with guaze and a pressure bandage to reduce any bruising.  The patient tolerated the procedure well and was given post procedure instructions.    Cora Collum, DO [facility]

## 2022-05-27 NOTE — Progress Notes (Addendum)
COVID Vaccine Completed: yes x2  Date of COVID positive in last 90 days: no  PCP - Franchot Erichsen, DO Cardiologist - Donato Schultz, MD  Chest x-ray - 03/13/22 Epic EKG - 03/11/22 Epic Stress Test - n/a ECHO - n/a Cardiac Cath - n/a Pacemaker/ICD device last checked: n/a Spinal Cord Stimulator: n/a  Bowel Prep - no  Sleep Study - getting done Sunday 06/08/22 CPAP -   Fasting Blood Sugar - n/a Checks Blood Sugar _____ times a day   Blood Thinner Instructions: n/a Aspirin Instructions: Last Dose:  Activity level: Can go up a flight of stairs and perform activities of daily living without stopping and without symptoms of chest pain or shortness of breath.   Anesthesia review:   Patient denies shortness of breath, fever, cough and chest pain at PAT appointment  Patient verbalized understanding of instructions that were given to them at the PAT appointment. Patient was also instructed that they will need to review over the PAT instructions again at home before surgery.

## 2022-05-27 NOTE — Patient Instructions (Addendum)
DUE TO COVID-19 ONLY TWO VISITORS  (aged 30 and older)  ARE ALLOWED TO COME WITH YOU AND STAY IN THE WAITING ROOM ONLY DURING PRE OP AND PROCEDURE.   **NO VISITORS ARE ALLOWED IN THE SHORT STAY AREA OR RECOVERY ROOM!!**   Your procedure is scheduled on: 06/16/22   Report to [facility] Main Entrance    Report to admitting at 5:15 AM   Call this number if you have problems the morning of surgery [phone]   Do not eat food :After Midnight.   After Midnight you may have the following liquids until 4:15 AM DAY OF SURGERY  Water Black Coffee (sugar ok, NO MILK/CREAM OR CREAMERS)  Tea (sugar ok, NO MILK/CREAM OR CREAMERS) regular and decaf                             Plain Jell-O (NO RED)                                           Fruit ices (not with fruit pulp, NO RED)                                     Popsicles (NO RED)                                                                  Juice: apple, WHITE grape, WHITE cranberry Sports drinks like Gatorade (NO RED) Clear broth(vegetable,chicken,beef)  FOLLOW BOWEL PREP AND ANY ADDITIONAL PRE OP INSTRUCTIONS YOU RECEIVED FROM YOUR SURGEON'S OFFICE!!!     Oral Hygiene is also important to reduce your risk of infection.                                    Remember - BRUSH YOUR TEETH THE MORNING OF SURGERY WITH YOUR REGULAR TOOTHPASTE   Take these medicines the morning of surgery with A SIP OF WATER: Tylenol, Inhalers, Zyrtec, Escitalopram, Pantoprazole                              You may not have any metal on your body including hair pins, jewelry, and body piercing             Do not wear make-up, lotions, powders, perfumes, or deodorant  Do not wear nail polish including gel and S&S, artificial/acrylic nails, or any other type of covering on natural nails including finger and toenails. If you have artificial nails, gel coating, etc. that needs to be removed by a nail salon please have this removed prior to surgery or  surgery may need to be canceled/ delayed if the surgeon/ anesthesia feels like they are unable to be safely monitored.   Do not shave  48 hours prior to surgery.    Do not bring valuables to the hospital. [facility] IS NOT  RESPONSIBLE   FOR VALUABLES.  DO NOT BRING YOUR HOME MEDICATIONS TO THE HOSPITAL. PHARMACY WILL DISPENSE MEDICATIONS LISTED ON YOUR MEDICATION LIST TO YOU DURING YOUR ADMISSION IN THE HOSPITAL!    Patients discharged on the day of surgery will not be allowed to drive home.  Someone NEEDS to stay with you for the first 24 hours after anesthesia.   Special Instructions: Bring a copy of your healthcare power of attorney and living will documents         the day of surgery if you haven't scanned them before.              Please read over the following fact sheets you were given: IF YOU HAVE QUESTIONS ABOUT YOUR PRE-OP INSTRUCTIONS PLEASE CALL [phone]- [facility] - Preparing for Surgery Before surgery, you can play an important role.  Because skin is not sterile, your skin needs to be as free of germs as possible.  You can reduce the number of germs on your skin by washing with CHG (chlorahexidine gluconate) soap before surgery.  CHG is an antiseptic cleaner which kills germs and bonds with the skin to continue killing germs even after washing. Please DO NOT use if you have an allergy to CHG or antibacterial soaps.  If your skin becomes reddened/irritated stop using the CHG and inform your nurse when you arrive at Short Stay. Do not shave (including legs and underarms) for at least 48 hours prior to the first CHG shower.  You may shave your face/neck.  Please follow these instructions carefully:  1.  Shower with CHG Soap the night before surgery and the  morning of surgery.  2.  If you choose to wash your hair, wash your hair first as usual with your normal  shampoo.  3.  After you shampoo, rinse your hair and body thoroughly to remove the shampoo.                              4.  Use CHG as you would any other liquid soap.  You can apply chg directly to the skin and wash.  Gently with a scrungie or clean washcloth.  5.  Apply the CHG Soap to your body ONLY FROM THE NECK DOWN.   Do   not use on face/ open                           Wound or open sores. Avoid contact with eyes, ears mouth and   genitals (private parts).                       Wash face,  Genitals (private parts) with your normal soap.             6.  Wash thoroughly, paying special attention to the area where your    surgery  will be performed.  7.  Thoroughly rinse your body with warm water from the neck down.  8.  DO NOT shower/wash with your normal soap after using and rinsing off the CHG Soap.                9.  Pat yourself dry with a clean towel.            10.  Wear clean pajamas.  11.  Place clean sheets on your bed the night of your first shower and do not  sleep with pets. Day of Surgery : Do not apply any lotions/deodorants the morning of surgery.  Please wear clean clothes to the hospital/surgery center.  FAILURE TO FOLLOW THESE INSTRUCTIONS MAY RESULT IN THE CANCELLATION OF YOUR SURGERY  PATIENT SIGNATURE_________________________________  NURSE SIGNATURE__________________________________  ________________________________________________________________________

## 2022-06-04 MED ORDER — ETONOGESTREL 68 MG ~~LOC~~ IMPL
68.0000 mg | DRUG_IMPLANT | Freq: Once | SUBCUTANEOUS | Status: AC
  Administered 2022-05-19: 68 mg via SUBCUTANEOUS

## 2022-06-04 NOTE — Addendum Note (Signed)
Addended by: Veronda Prude on: 06/04/2022 03:10 PM   Modules accepted: Orders

## 2022-06-06 ENCOUNTER — Encounter (HOSPITAL_COMMUNITY)
Admission: RE | Admit: 2022-06-06 | Discharge: 2022-06-06 | Disposition: A | Discharge status: Home / Self Care | Payer: Medicaid Other | Source: Ambulatory Visit | Attending: Surgery | Admitting: Surgery

## 2022-06-06 ENCOUNTER — Encounter (HOSPITAL_COMMUNITY): Payer: Self-pay

## 2022-06-06 VITALS — BP 125/81 | HR 71 | Temp 99.0°F | Resp 12 | Ht 62.0 in | Wt 238.0 lb

## 2022-06-06 DIAGNOSIS — Z01812 Encounter for preprocedural laboratory examination: Secondary | ICD-10-CM | POA: Insufficient documentation

## 2022-06-06 DIAGNOSIS — D649 Anemia, unspecified: Secondary | ICD-10-CM

## 2022-06-06 DIAGNOSIS — I2510 Atherosclerotic heart disease of native coronary artery without angina pectoris: Secondary | ICD-10-CM

## 2022-06-06 LAB — CBC
HCT: 37.4 % (ref 36.0–46.0)
Hemoglobin: 12.1 g/dL (ref 12.0–15.0)
MCH: 27.3 pg (ref 26.0–34.0)
MCHC: 32.4 g/dL (ref 30.0–36.0)
MCV: 84.4 fL (ref 80.0–100.0)
Platelets: 378 10*3/uL (ref 150–400)
RBC: 4.43 MIL/uL (ref 3.87–5.11)
RDW: 15.4 % (ref 11.5–15.5)
WBC: 5.8 10*3/uL (ref 4.0–10.5)
nRBC: 0 % (ref 0.0–0.2)

## 2022-06-06 LAB — BASIC METABOLIC PANEL
Anion gap: 6 (ref 5–15)
BUN: 11 mg/dL (ref 6–20)
CO2: 24 mmol/L (ref 22–32)
Calcium: 8.5 mg/dL — ABNORMAL LOW (ref 8.9–10.3)
Chloride: 107 mmol/L (ref 98–111)
Creatinine, Ser: 1.1 mg/dL — ABNORMAL HIGH (ref 0.44–1.00)
GFR, Estimated: >60 mL/min (ref >60)
Glucose, Bld: 77 mg/dL (ref 70–99)
Potassium: 4.1 mmol/L (ref 3.5–5.1)
Sodium: 137 mmol/L (ref 135–145)

## 2022-06-07 DIAGNOSIS — Z419 Encounter for procedure for purposes other than remedying health state, unspecified: Secondary | ICD-10-CM | POA: Diagnosis not present

## 2022-06-08 ENCOUNTER — Ambulatory Visit (HOSPITAL_BASED_OUTPATIENT_CLINIC_OR_DEPARTMENT_OTHER): Payer: Medicaid Other | Attending: Family Medicine | Admitting: Internal Medicine

## 2022-06-08 VITALS — Ht 62.0 in | Wt 240.0 lb

## 2022-06-08 DIAGNOSIS — G4734 Idiopathic sleep related nonobstructive alveolar hypoventilation: Secondary | ICD-10-CM | POA: Insufficient documentation

## 2022-06-09 ENCOUNTER — Ambulatory Visit (INDEPENDENT_AMBULATORY_CARE_PROVIDER_SITE_OTHER): Payer: Medicaid Other | Admitting: Emergency Medicine

## 2022-06-09 DIAGNOSIS — J4520 Mild intermittent asthma, uncomplicated: Secondary | ICD-10-CM

## 2022-06-09 LAB — PULMONARY FUNCTION TEST
DL/VA % pred: 127 %
DL/VA: 5.96 ml/min/mmHg/L
DLCO cor % pred: 122 %
DLCO cor: 25.63 ml/min/mmHg
DLCO unc % pred: 117 %
DLCO unc: 24.54 ml/min/mmHg
FEF 25-75 Post: 3.34 L/sec
FEF 25-75 Pre: 3.36 L/sec
FEF2575-%Change-Post: 0 %
FEF2575-%Pred-Post: 99 %
FEF2575-%Pred-Pre: 100 %
FEV1-%Change-Post: 0 %
FEV1-%Pred-Post: 91 %
FEV1-%Pred-Pre: 90 %
FEV1-Post: 2.75 L
FEV1-Pre: 2.73 L
FEV1FVC-%Change-Post: 3 %
FEV1FVC-%Pred-Pre: 101 %
FEV6-%Change-Post: -2 %
FEV6-%Pred-Post: 88 %
FEV6-%Pred-Pre: 90 %
FEV6-Post: 3.12 L
FEV6-Pre: 3.19 L
FEV6FVC-%Pred-Post: 100 %
FEV6FVC-%Pred-Pre: 100 %
FVC-%Change-Post: -2 %
FVC-%Pred-Post: 88 %
FVC-%Pred-Pre: 90 %
FVC-Post: 3.12 L
FVC-Pre: 3.19 L
Post FEV1/FVC ratio: 88 %
Post FEV6/FVC ratio: 100 %
Pre FEV1/FVC ratio: 86 %
Pre FEV6/FVC Ratio: 100 %
RV % pred: 111 %
RV: 1.43 L
TLC % pred: 96 %
TLC: 4.59 L

## 2022-06-09 NOTE — Progress Notes (Signed)
PFT done today. 

## 2022-06-15 DIAGNOSIS — G4734 Idiopathic sleep related nonobstructive alveolar hypoventilation: Secondary | ICD-10-CM | POA: Diagnosis not present

## 2022-06-15 NOTE — Procedures (Signed)
    Patient Name: Veronica [name], Veronica [name] Date: 06/08/2022 Gender: Female D.O.B: Mar 15, 1991 Age (years): 31 Referring Provider: Leighton Roach McDiarmid Height (inches): 62 Interpreting Physician: Jetty Duhamel MD, ABSM Weight (lbs): 240 RPSGT: Rosette Reveal BMI: 44 MRN: [id number] Neck Size: 15.00  CLINICAL INFORMATION Sleep Study Type: NPSG Indication for sleep study: Hypertension Epworth Sleepiness Score: 7  SLEEP STUDY TECHNIQUE As per the AASM Manual for the Scoring of Sleep and Associated Events v2.3 (April 2016) with a hypopnea requiring 4% desaturations.  The channels recorded and monitored were frontal, central and occipital EEG, electrooculogram (EOG), submentalis EMG (chin), nasal and oral airflow, thoracic and abdominal wall motion, anterior tibialis EMG, snore microphone, electrocardiogram, and pulse oximetry.  MEDICATIONS Medications self-administered by patient taken the night of the study : none reported  SLEEP ARCHITECTURE The study was initiated at 11:12:39 PM and ended at 5:42:44 AM.  Sleep onset time was 3.9 minutes and the sleep efficiency was 89.1%%. The total sleep time was 347.5 minutes.  Stage REM latency was 157.0 minutes.  The patient spent 2.4%% of the night in stage N1 sleep, 60.9%% in stage N2 sleep, 16.1%% in stage N3 and 20.6% in REM.  Alpha intrusion was absent.  Supine sleep was 14.68%.  RESPIRATORY PARAMETERS The overall apnea/hypopnea index (AHI) was 0.0 per hour. There were 0 total apneas, including 0 obstructive, 0 central and 0 mixed apneas. There were 0 hypopneas and 0 RERAs.  The AHI during Stage REM sleep was 0.0 per hour.  AHI while supine was 0.0 per hour.  The mean oxygen saturation was 96.8%. The minimum SpO2 during sleep was 94.0%.  soft snoring was noted during this study.  CARDIAC DATA The 2 lead EKG demonstrated sinus rhythm. The mean heart rate was 69.5 beats per minute. Other EKG findings include: None.  LEG MOVEMENT  DATA The total PLMS were 0 with a resulting PLMS index of 0.0. Associated arousal with leg movement index was 0.0 .  IMPRESSIONS - No significant obstructive sleep apnea occurred during this study (AHI = 0.0/h). - The patient had minimal or no oxygen desaturation during the study (Min O2 = 94.0%) - The patient snored with soft snoring volume. - No cardiac abnormalities were noted during this study. - Clinically significant periodic limb movements did not occur during sleep. No significant associated arousals.  DIAGNOSIS - Normal Study  RECOMMENDATIONS - Manage for symptoms based on clinical judgment. - Sleep hygiene should be reviewed to assess factors that may improve sleep quality. - Weight management and regular exercise should be initiated or continued if appropriate.  [Electronically signed] 06/15/2022 11:59 AM  Jetty Duhamel MD, ABSM Diplomate, American Board of Sleep Medicine NPI: [id number]                         Jetty Duhamel Diplomate, American Board of Sleep Medicine  ELECTRONICALLY SIGNED ON:  06/15/2022, 11:57 AM [facility] PH: [phone]   FX: [phone] ACCREDITED BY THE AMERICAN ACADEMY OF SLEEP MEDICINE

## 2022-06-15 NOTE — Anesthesia Preprocedure Evaluation (Addendum)
Anesthesia Evaluation  Patient identified by MRN, date of birth, ID band Patient awake    Reviewed: Allergy & Precautions, NPO status , Patient's Chart, lab work & pertinent test results  Airway Mallampati: II  TM Distance: >3 FB Neck ROM: Full    Dental no notable dental hx. (+) Teeth Intact, Dental Advisory Given   Pulmonary asthma ,    Pulmonary exam normal breath sounds clear to auscultation       Cardiovascular hypertension, Normal cardiovascular exam Rhythm:Regular Rate:Normal     Neuro/Psych  Headaches, PSYCHIATRIC DISORDERS Anxiety    GI/Hepatic Neg liver ROS, hiatal hernia, GERD  ,  Endo/Other  Hypothyroidism Morbid obesity (BMI 44)  Renal/GU negative Renal ROS  negative genitourinary   Musculoskeletal negative musculoskeletal ROS (+)   Abdominal   Peds  Hematology negative hematology ROS (+)   Anesthesia Other Findings   Reproductive/Obstetrics                            Anesthesia Physical Anesthesia Plan  ASA: 3  Anesthesia Plan: General   Post-op Pain Management: Tylenol PO (pre-op)*   Induction: Intravenous  PONV Risk Score and Plan: 3 and Midazolam, Dexamethasone and Ondansetron  Airway Management Planned: Oral ETT  Additional Equipment:   Intra-op Plan:   Post-operative Plan: Extubation in OR  Informed Consent: I have reviewed the patients History and Physical, chart, labs and discussed the procedure including the risks, benefits and alternatives for the proposed anesthesia with the patient or authorized representative who has indicated his/her understanding and acceptance.     Dental advisory given  Plan Discussed with: CRNA  Anesthesia Plan Comments:        Anesthesia Quick Evaluation

## 2022-06-16 ENCOUNTER — Ambulatory Visit (HOSPITAL_COMMUNITY)
Admission: RE | Admit: 2022-06-16 | Discharge: 2022-06-16 | Disposition: A | Discharge status: Home / Self Care | Payer: Medicaid Other | Source: Ambulatory Visit | Attending: Surgery | Admitting: Surgery

## 2022-06-16 ENCOUNTER — Encounter (HOSPITAL_COMMUNITY): Payer: Self-pay | Admitting: Surgery

## 2022-06-16 ENCOUNTER — Other Ambulatory Visit: Payer: Self-pay

## 2022-06-16 ENCOUNTER — Ambulatory Visit (HOSPITAL_BASED_OUTPATIENT_CLINIC_OR_DEPARTMENT_OTHER): Payer: Medicaid Other | Admitting: Anesthesiology

## 2022-06-16 ENCOUNTER — Ambulatory Visit (HOSPITAL_COMMUNITY): Payer: Medicaid Other | Admitting: Anesthesiology

## 2022-06-16 ENCOUNTER — Encounter (HOSPITAL_COMMUNITY)
Admission: RE | Disposition: A | Discharge status: Home / Self Care | Payer: Self-pay | Source: Ambulatory Visit | Attending: Surgery

## 2022-06-16 DIAGNOSIS — D649 Anemia, unspecified: Secondary | ICD-10-CM

## 2022-06-16 DIAGNOSIS — K219 Gastro-esophageal reflux disease without esophagitis: Secondary | ICD-10-CM | POA: Insufficient documentation

## 2022-06-16 DIAGNOSIS — I10 Essential (primary) hypertension: Secondary | ICD-10-CM | POA: Insufficient documentation

## 2022-06-16 DIAGNOSIS — F419 Anxiety disorder, unspecified: Secondary | ICD-10-CM | POA: Insufficient documentation

## 2022-06-16 DIAGNOSIS — Z01818 Encounter for other preprocedural examination: Secondary | ICD-10-CM

## 2022-06-16 DIAGNOSIS — K811 Chronic cholecystitis: Secondary | ICD-10-CM | POA: Diagnosis not present

## 2022-06-16 DIAGNOSIS — J45909 Unspecified asthma, uncomplicated: Secondary | ICD-10-CM | POA: Insufficient documentation

## 2022-06-16 DIAGNOSIS — E039 Hypothyroidism, unspecified: Secondary | ICD-10-CM | POA: Diagnosis not present

## 2022-06-16 DIAGNOSIS — K8064 Calculus of gallbladder and bile duct with chronic cholecystitis without obstruction: Secondary | ICD-10-CM | POA: Insufficient documentation

## 2022-06-16 DIAGNOSIS — Z6841 Body Mass Index (BMI) 40.0 and over, adult: Secondary | ICD-10-CM | POA: Diagnosis not present

## 2022-06-16 DIAGNOSIS — K760 Fatty (change of) liver, not elsewhere classified: Secondary | ICD-10-CM

## 2022-06-16 DIAGNOSIS — I2510 Atherosclerotic heart disease of native coronary artery without angina pectoris: Secondary | ICD-10-CM

## 2022-06-16 DIAGNOSIS — K8066 Calculus of gallbladder and bile duct with acute and chronic cholecystitis without obstruction: Secondary | ICD-10-CM | POA: Diagnosis not present

## 2022-06-16 HISTORY — PX: CHOLECYSTECTOMY: SHX55

## 2022-06-16 LAB — PREGNANCY, URINE: Preg Test, Ur: NEGATIVE

## 2022-06-16 SURGERY — LAPAROSCOPIC CHOLECYSTECTOMY
Anesthesia: General | Site: Abdomen

## 2022-06-16 MED ORDER — BUPIVACAINE LIPOSOME 1.3 % IJ SUSP
INTRAMUSCULAR | Status: AC
  Filled 2022-06-16: qty 20

## 2022-06-16 MED ORDER — BUPIVACAINE LIPOSOME 1.3 % IJ SUSP: 20.0000 mL | Freq: Once | INTRAMUSCULAR | Status: DC

## 2022-06-16 MED ORDER — TRAMADOL HCL 50 MG PO TABS: 50.0000 mg | ORAL_TABLET | Freq: Four times a day (QID) | ORAL | 0 refills | Status: AC | PRN

## 2022-06-16 MED ORDER — ORAL CARE MOUTH RINSE: 15.0000 mL | Freq: Once | OROMUCOSAL | Status: AC

## 2022-06-16 MED ORDER — OXYCODONE HCL 5 MG PO TABS
5.0000 mg | ORAL_TABLET | Freq: Once | ORAL | Status: AC | PRN
  Administered 2022-06-16: 5 mg via ORAL

## 2022-06-16 MED ORDER — MIDAZOLAM HCL 5 MG/5ML IJ SOLN
INTRAMUSCULAR | Status: DC | PRN
  Administered 2022-06-16: 2 mg via INTRAVENOUS

## 2022-06-16 MED ORDER — STERILE WATER FOR INJECTION IJ SOLN
INTRAMUSCULAR | Status: DC | PRN
  Administered 2022-06-16: 2 mL via INTRAVENOUS

## 2022-06-16 MED ORDER — ONDANSETRON HCL 4 MG/2ML IJ SOLN
INTRAMUSCULAR | Status: DC | PRN
  Administered 2022-06-16: 4 mg via INTRAVENOUS

## 2022-06-16 MED ORDER — BUPIVACAINE-EPINEPHRINE 0.25% -1:200000 IJ SOLN
INTRAMUSCULAR | Status: DC | PRN
  Administered 2022-06-16: 15 mL

## 2022-06-16 MED ORDER — OXYCODONE HCL 5 MG/5ML PO SOLN: 5.0000 mg | Freq: Once | ORAL | Status: AC | PRN

## 2022-06-16 MED ORDER — INDOCYANINE GREEN 25 MG IV SOLR
2.5000 mg | Freq: Once | INTRAVENOUS | Status: AC
  Administered 2022-06-16: 2.5 mg via INTRAVENOUS
  Filled 2022-06-16: qty 1

## 2022-06-16 MED ORDER — SUGAMMADEX SODIUM 500 MG/5ML IV SOLN
INTRAVENOUS | Status: DC | PRN
  Administered 2022-06-16: 400 mg via INTRAVENOUS

## 2022-06-16 MED ORDER — GABAPENTIN 300 MG PO CAPS
300.0000 mg | ORAL_CAPSULE | ORAL | Status: AC
  Administered 2022-06-16: 300 mg via ORAL
  Filled 2022-06-16: qty 1

## 2022-06-16 MED ORDER — DEXAMETHASONE SODIUM PHOSPHATE 10 MG/ML IJ SOLN
INTRAMUSCULAR | Status: DC | PRN
  Administered 2022-06-16: 5 mg via INTRAVENOUS

## 2022-06-16 MED ORDER — FENTANYL CITRATE (PF) 100 MCG/2ML IJ SOLN
INTRAMUSCULAR | Status: DC | PRN
Start: 2022-06-16 — End: 2022-06-16
  Administered 2022-06-16 (×6): 50 ug via INTRAVENOUS

## 2022-06-16 MED ORDER — BUPIVACAINE-EPINEPHRINE (PF) 0.25% -1:200000 IJ SOLN
INTRAMUSCULAR | Status: AC
  Filled 2022-06-16: qty 30

## 2022-06-16 MED ORDER — LIDOCAINE 2% (20 MG/ML) 5 ML SYRINGE
INTRAMUSCULAR | Status: DC | PRN
  Administered 2022-06-16: 100 mg via INTRAVENOUS

## 2022-06-16 MED ORDER — ROCURONIUM BROMIDE 10 MG/ML (PF) SYRINGE
PREFILLED_SYRINGE | INTRAVENOUS | Status: DC | PRN
  Administered 2022-06-16: 100 mg via INTRAVENOUS

## 2022-06-16 MED ORDER — LACTATED RINGERS IR SOLN
Status: DC | PRN
  Administered 2022-06-16: 1000 mL

## 2022-06-16 MED ORDER — ACETAMINOPHEN 500 MG PO TABS: 1000.0000 mg | ORAL_TABLET | ORAL | Status: DC

## 2022-06-16 MED ORDER — PROPOFOL 10 MG/ML IV BOLUS
INTRAVENOUS | Status: DC | PRN
  Administered 2022-06-16: 150 mg via INTRAVENOUS
  Administered 2022-06-16: 50 mg via INTRAVENOUS

## 2022-06-16 MED ORDER — FENTANYL CITRATE PF 50 MCG/ML IJ SOSY
25.0000 ug | PREFILLED_SYRINGE | INTRAMUSCULAR | Status: DC | PRN
  Administered 2022-06-16: 50 ug via INTRAVENOUS

## 2022-06-16 MED ORDER — ACETAMINOPHEN 500 MG PO TABS
1000.0000 mg | ORAL_TABLET | Freq: Once | ORAL | Status: AC
  Administered 2022-06-16: 1000 mg via ORAL
  Filled 2022-06-16: qty 2

## 2022-06-16 MED ORDER — CHLORHEXIDINE GLUCONATE 4 % EX LIQD: 60.0000 mL | Freq: Once | CUTANEOUS | Status: DC

## 2022-06-16 MED ORDER — CHLORHEXIDINE GLUCONATE 0.12 % MT SOLN
15.0000 mL | Freq: Once | OROMUCOSAL | Status: AC
  Administered 2022-06-16: 15 mL via OROMUCOSAL

## 2022-06-16 MED ORDER — CEFAZOLIN SODIUM-DEXTROSE 2-4 GM/100ML-% IV SOLN
2.0000 g | INTRAVENOUS | Status: AC
  Administered 2022-06-16: 2 g via INTRAVENOUS
  Filled 2022-06-16: qty 100

## 2022-06-16 MED ORDER — 0.9 % SODIUM CHLORIDE (POUR BTL) OPTIME
TOPICAL | Status: DC | PRN
  Administered 2022-06-16: 1000 mL

## 2022-06-16 MED ORDER — BUPIVACAINE LIPOSOME 1.3 % IJ SUSP
INTRAMUSCULAR | Status: DC | PRN
  Administered 2022-06-16: 15 mL

## 2022-06-16 MED ORDER — LACTATED RINGERS IV SOLN: INTRAVENOUS | Status: DC

## 2022-06-16 SURGICAL SUPPLY — 47 items
ADH SKN CLS APL DERMABOND .7 (GAUZE/BANDAGES/DRESSINGS) ×1
APL PRP STRL LF DISP 70% ISPRP (MISCELLANEOUS) ×1
APPLIER CLIP ROT 10 11.4 M/L (STAPLE) ×2
APR CLP MED LRG 11.4X10 (STAPLE) ×1
BAG COUNTER SPONGE SURGICOUNT (BAG) IMPLANT
BAG SPNG CNTER NS LX DISP (BAG)
CABLE HIGH FREQUENCY MONO STRZ (ELECTRODE) ×2 IMPLANT
CHLORAPREP W/TINT 26 (MISCELLANEOUS) ×2 IMPLANT
CLIP APPLIE ROT 10 11.4 M/L (STAPLE) ×1 IMPLANT
COVER MAYO STAND STRL (DRAPES) IMPLANT
COVER SURGICAL LIGHT HANDLE (MISCELLANEOUS) ×2 IMPLANT
DERMABOND ADVANCED (GAUZE/BANDAGES/DRESSINGS) ×1
DERMABOND ADVANCED .7 DNX12 (GAUZE/BANDAGES/DRESSINGS) ×1 IMPLANT
DRAPE C-ARM 42X120 X-RAY (DRAPES) IMPLANT
ELECT REM PT RETURN 15FT ADLT (MISCELLANEOUS) ×2 IMPLANT
GLOVE BIO SURGEON STRL SZ 6 (GLOVE) ×2 IMPLANT
GLOVE INDICATOR 6.5 STRL GRN (GLOVE) ×2 IMPLANT
GLOVE SS BIOGEL STRL SZ 6 (GLOVE) ×1 IMPLANT
GLOVE SUPERSENSE BIOGEL SZ 6 (GLOVE) ×1
GOWN STRL REUS W/ TWL LRG LVL3 (GOWN DISPOSABLE) ×1 IMPLANT
GOWN STRL REUS W/ TWL XL LVL3 (GOWN DISPOSABLE) IMPLANT
GOWN STRL REUS W/TWL LRG LVL3 (GOWN DISPOSABLE) ×2
GOWN STRL REUS W/TWL XL LVL3 (GOWN DISPOSABLE)
GRASPER SUT TROCAR 14GX15 (MISCELLANEOUS) ×2 IMPLANT
HEMOSTAT SNOW SURGICEL 2X4 (HEMOSTASIS) IMPLANT
IRRIG SUCT STRYKERFLOW 2 WTIP (MISCELLANEOUS) ×2
IRRIGATION SUCT STRKRFLW 2 WTP (MISCELLANEOUS) ×1 IMPLANT
KIT BASIN OR (CUSTOM PROCEDURE TRAY) ×2 IMPLANT
KIT IMAGING PINPOINTPAQ (MISCELLANEOUS) ×1 IMPLANT
KIT TURNOVER KIT A (KITS) IMPLANT
NDL INSUFFLATION 14GA 120MM (NEEDLE) ×1 IMPLANT
NEEDLE INSUFFLATION 14GA 120MM (NEEDLE) ×2 IMPLANT
PENCIL SMOKE EVACUATOR (MISCELLANEOUS) IMPLANT
SCISSORS LAP 5X35 DISP (ENDOMECHANICALS) ×2 IMPLANT
SET CHOLANGIOGRAPH MIX (MISCELLANEOUS) IMPLANT
SET TUBE SMOKE EVAC HIGH FLOW (TUBING) ×2 IMPLANT
SLEEVE Z-THREAD 5X100MM (TROCAR) ×4 IMPLANT
SPIKE FLUID TRANSFER (MISCELLANEOUS) ×2 IMPLANT
SUT MNCRL AB 4-0 PS2 18 (SUTURE) ×2 IMPLANT
SYS BAG RETRIEVAL 10MM (BASKET) ×2
SYSTEM BAG RETRIEVAL 10MM (BASKET) ×1 IMPLANT
TOWEL OR 17X26 10 PK STRL BLUE (TOWEL DISPOSABLE) ×2 IMPLANT
TOWEL OR NON WOVEN STRL DISP B (DISPOSABLE) IMPLANT
TRAY LAPAROSCOPIC (CUSTOM PROCEDURE TRAY) ×2 IMPLANT
TROCAR ADV FIXATION 12X100MM (TROCAR) ×1 IMPLANT
TROCAR Z THREAD OPTICAL 12X100 (TROCAR) ×1 IMPLANT
TROCAR Z-THREAD OPTICAL 5X100M (TROCAR) ×2 IMPLANT

## 2022-06-16 NOTE — Anesthesia Postprocedure Evaluation (Signed)
Anesthesia Post Note  Patient: Veronica [name]  Procedure(s) Performed: LAPAROSCOPIC  CHOLECYSTECTOMY (Abdomen)     Patient location during evaluation: PACU Anesthesia Type: General Level of consciousness: awake and alert Pain management: pain level controlled Vital Signs Assessment: post-procedure vital signs reviewed and stable Respiratory status: spontaneous breathing, nonlabored ventilation, respiratory function stable and patient connected to nasal cannula oxygen Cardiovascular status: blood pressure returned to baseline and stable Postop Assessment: no apparent nausea or vomiting Anesthetic complications: no   No notable events documented.  Last Vitals:  Vitals:   06/16/22 1000 06/16/22 1015  BP: (!) 161/90 (!) 162/94  Pulse: 98 96  Resp: 20 16  Temp:  36.7 C  SpO2: 93% 96%    Last Pain:  Vitals:   06/16/22 1015  TempSrc:   PainSc: 4                  Shawana Knoch L Maryiah Olvey

## 2022-06-16 NOTE — Transfer of Care (Signed)
Immediate Anesthesia Transfer of Care Note  Patient: Veronica [name] Ventola  Procedure(s) Performed: LAPAROSCOPIC  CHOLECYSTECTOMY (Abdomen)  Patient Location: PACU  Anesthesia Type:General  Level of Consciousness: drowsy  Airway & Oxygen Therapy: Patient Spontanous Breathing and Patient connected to face mask oxygen  Post-op Assessment: Report given to RN and Post -op Vital signs reviewed and stable  Post vital signs: Reviewed and stable  Last Vitals:  Vitals Value Taken Time  BP 138/71 06/16/22 0918  Temp    Pulse 96 06/16/22 0922  Resp 21 06/16/22 0922  SpO2 97 % 06/16/22 0922  Vitals shown include unvalidated device data.  Last Pain:  Vitals:   06/16/22 0615  TempSrc:   PainSc: 0-No pain         Complications: No notable events documented.

## 2022-06-16 NOTE — H&P (Signed)
Marijah Fencl [id number]    Referring Provider:  Lovell Sheehan,*     Subjective    Chief Complaint: new patient (Right abdominal pain)       History of Present Illness:    31 year old woman with history of obesity, GERD, hiatal hernia, asthma, and previous C-section x2 who presents for evaluation for cholecystectomy.  She was admitted to [facility] about 6 weeks ago with right flank pain and found to have both gallstones and pyelonephritis.  Bladder was treated and she is subsequently been discharged.  In the last few weeks however she has had intermittent right upper quadrant pain which can be quite severe, radiating to the epigastrium, chest, and right upper back.  This has been associated with nausea and vomiting.  She has been taking Tylenol when needed.  Does note reflux related to her known hiatal hernia.  Abdominal surgery includes C-section x2, most recently 6 months ago.  She also notes that she has been losing weight, about 60 pounds in the last 4 months, though of note she did just have a baby.       Review of Systems: A complete review of systems was obtained from the patient.  I have reviewed this information and discussed as appropriate with the patient.  See HPI as well for other ROS.     Medical History: Past Medical History         Past Medical History:  Diagnosis Date   Anemia     Anxiety     Asthma, unspecified asthma severity, unspecified whether complicated, unspecified whether persistent     GERD (gastroesophageal reflux disease)     Hypertension          There is no problem list on file for this patient.     Past Surgical History           Past Surgical History:  Procedure Laterality Date   CESAREAN SECTION       REPEAT CESAREAN SECTION       TONSILLECTOMY & ADENOIDECTOMY            Allergies           Allergies  Allergen Reactions   Ibuprofen Unknown      Was told to not take this because it would flare stomach issues   Morphine  Other (See Comments)      Causes "chest to be heavy"                   Current Outpatient Medications on File Prior to Visit  Medication Sig Dispense Refill   docusate (COLACE) 100 MG capsule Take 100 mg by mouth 2 (two) times daily       cetirizine (ZYRTEC) 5 MG tablet Take 5 mg by mouth once daily       escitalopram oxalate (LEXAPRO) 10 MG tablet Take 10 mg by mouth once daily       FEROSUL 325 mg (65 mg iron) tablet Take 325 mg by mouth every other day       FLOVENT HFA 44 mcg/actuation inhaler INHALE 2 PUFFS BY MOUTH IN THE MORNING AND AT BEDTIME       hydrOXYzine (ATARAX) 25 MG tablet Take 25 mg by mouth 3 (three) times daily as needed       pantoprazole (PROTONIX) 20 MG DR tablet TAKE 1 TABLET BY MOUTH TWICE DAILY ( TAKE IF PEPCID DOSE NOT WORK )       SPRINTEC, 28, 0.25-35 mg-mcg  tablet Take 1 tablet by mouth once daily        No current facility-administered medications on file prior to visit.      Family History           Family History  Problem Relation Age of Onset   High blood pressure (Hypertension) Mother     Coronary Artery Disease (Blocked arteries around heart) Mother     Diabetes Mother     Obesity Father     Obesity Sister     Obesity Brother          Social History         Tobacco Use  Smoking Status Never  Smokeless Tobacco Never      Social History  Social History           Socioeconomic History   Marital status: Single  Tobacco Use   Smoking status: Never   Smokeless tobacco: Never  Vaping Use   Vaping Use: Never used  Substance and Sexual Activity   Alcohol use: Yes   Drug use: Never        Objective:           Vitals:    04/24/22 1021  BP: 118/78  Pulse: 103  Temp: 36.6 C (97.9 F)  SpO2: 98%  Weight: (!) 107 kg (236 lb)  Height: 157.5 cm (5\' 2" )    Body mass index is 43.16 kg/m.   Alert and well-appearing Unlabored respirations   Assessment and Plan:  Diagnoses and all orders for this visit:   Right upper  quadrant abdominal pain   Biliary colic     Gallstones noted on ultrasound in early April, and symptoms are classic for biliary etiology. I recommend proceeding with laparoscopic or robotic cholecystectomy with possible cholangiogram.  Discussed the relevant anatomy using a diagram to demonstrate, and went over surgical technique.  Discussed risks of surgery including bleeding, pain, scarring, intraabdominal injury specifically to the common bile duct and sequelae, bile leak, conversion to open surgery, subtotal cholecystectomy, failure to resolve symptoms, blood clots/ pulmonary embolus, heart attack, pneumonia, stroke, death. Questions welcomed and answered to patient's satisfaction.  We will schedule as soon as possible.   Jayd Cadieux May, MD

## 2022-06-16 NOTE — Discharge Instructions (Signed)
LAPAROSCOPIC SURGERY: POST OP INSTRUCTIONS   EAT Gradually transition to a high fiber diet with a fiber supplement over the next few weeks after discharge.  Start with a pureed / full liquid diet (see below)  WALK Walk an hour a day (cumulative- not all at once).  Control your pain to do that.    CONTROL PAIN Control pain so that you can walk, sleep, tolerate sneezing/coughing, go up/down stairs.  HAVE A BOWEL MOVEMENT DAILY Keep your bowels regular to avoid problems.  OK to try a laxative to override constipation.  OK to use an antidairrheal to slow down diarrhea.  Call if not better after 2 tries  CALL IF YOU HAVE PROBLEMS/CONCERNS Call if you are still struggling despite following these instructions. Call if you have concerns not answered by these instructions    DIET: Follow a light bland diet & liquids the first 24 hours after arrival home, such as soup, liquids, starches, etc.  Be sure to drink plenty of fluids.  Quickly advance to a usual solid diet within a few days.  Avoid fast food or heavy meals as your are more likely to get nauseated or have irregular bowels.  A low-sugar, high-fiber diet for the rest of your life is ideal.  Take your usually prescribed home medications unless otherwise directed.  PAIN CONTROL: Pain is best controlled by a usual combination of three different methods TOGETHER: Ice/Heat Over the counter pain medication Prescription pain medication Most patients will experience some swelling and bruising around the incisions.  Ice packs or heating pads (30-60 minutes up to 6 times a day) will help. Use ice for the first few days to help decrease swelling and bruising, then switch to heat to help relax tight/sore spots and speed recovery.  Some people prefer to use ice alone, heat alone, alternating between ice & heat.  Experiment to what works for you.  Swelling and bruising can take several weeks to resolve.   It is helpful to take an over-the-counter pain  medication regularly for the first few days: Naproxen (Aleve, etc)  Two 220mg tabs twice a day OR Ibuprofen (Advil, etc) Three 200mg tabs four times a day (every meal & bedtime) AND Acetaminophen (Tylenol, etc) 500-650mg four times a day (every meal & bedtime) A  prescription for pain medication (such as oxycodone, hydrocodone, tramadol, gabapentin, methocarbamol, etc) should be given to you upon discharge.  Take your pain medication as prescribed, IF NEEDED.  If you are having problems/concerns with the prescription medicine (does not control pain, nausea, vomiting, rash, itching, etc), please call us [phone] to see if we need to switch you to a different pain medicine that will work better for you and/or control your side effect better. If you need a refill on your pain medication, please give us 48 hour notice.  contact your pharmacy.  They will contact our office to request authorization. Prescriptions will not be filled after 5 pm or on week-ends  Avoid getting constipated.   Between the surgery and the pain medications, it is common to experience some constipation.   Increasing fluid intake and taking a fiber supplement (such as Metamucil, Citrucel, FiberCon, MiraLax, etc) 1-2 times a day regularly will usually help prevent this problem from occurring.   A mild laxative (prune juice, Milk of Magnesia, MiraLax, etc) should be taken according to package directions if there are no bowel movements after 48 hours.   Watch out for diarrhea.   If you have many loose   bowel movements, simplify your diet to bland foods & liquids for a few days.   Stop any stool softeners and decrease your fiber supplement.   Switching to mild anti-diarrheal medications (Kayopectate, Pepto Bismol) can help.   If this worsens or does not improve, please call us.  Wash / shower every day.  You may shower over the skin glue which is waterproof  Glue will flake off after about 2 weeks.  You may leave the incision  open to air.  You may replace a dressing/Band-Aid to cover the incision for comfort if you wish.   ACTIVITIES as tolerated:   You may resume regular (light) daily activities beginning the next day--such as daily self-care, walking, climbing stairs--gradually increasing activities as tolerated.  If you can walk 30 minutes without difficulty, it is safe to try more intense activity such as jogging, treadmill, bicycling, low-impact aerobics, swimming, etc. Save the most intensive and strenuous activity for last such as sit-ups, heavy lifting, contact sports, etc  Refrain from any heavy lifting or straining until you are off narcotics for pain control.   DO NOT PUSH THROUGH PAIN.  Let pain be your guide: If it hurts to do something, don't do it.  Pain is your body warning you to avoid that activity for another week until the pain goes down. You may drive when you are no longer taking prescription pain medication, you can comfortably wear a seatbelt, and you can safely maneuver your car and apply brakes. You may have sexual intercourse when it is comfortable.  FOLLOW UP in our office Please call [facility] at [phone] to set up an appointment to see your surgeon in the office for a follow-up appointment approximately 2-3 weeks after your surgery. Make sure that you call for this appointment the day you arrive home to insure a convenient appointment time.  10. IF YOU HAVE DISABILITY OR FAMILY LEAVE FORMS, BRING THEM TO THE OFFICE FOR PROCESSING.  DO NOT GIVE THEM TO YOUR DOCTOR.   WHEN TO CALL US [phone]: Poor pain control Reactions / problems with new medications (rash/itching, nausea, etc)  Fever over 101.5 F (38.5 C) Inability to urinate Nausea and/or vomiting Worsening swelling or bruising Continued bleeding from incision. Increased pain, redness, or drainage from the incision   The clinic staff is available to answer your questions during regular business hours (8:30am-5pm).  Please  don't hesitate to call and ask to speak to one of our nurses for clinical concerns.   If you have a medical emergency, go to the nearest emergency room or call 911.  A surgeon from [facility] is always on call at the hospitals   [facility] [address], [address], [place], [place]  [address] ? MAIN: [phone] ? TOLL FREE: [phone] ?  FAX [phone] [email or web address]  

## 2022-06-16 NOTE — Anesthesia Procedure Notes (Signed)
Procedure Name: Intubation Date/Time: 06/16/2022 7:28 AM  Performed by: West Pugh, CRNAPre-anesthesia Checklist: Patient identified, Emergency Drugs available, Suction available, Patient being monitored and Timeout performed Patient Re-evaluated:Patient Re-evaluated prior to induction Oxygen Delivery Method: Circle system utilized Preoxygenation: Pre-oxygenation with 100% oxygen Induction Type: IV induction Ventilation: Mask ventilation without difficulty Laryngoscope Size: Mac and 3 Grade View: Grade I Tube type: Oral Tube size: 7.0 mm Number of attempts: 1 Airway Equipment and Method: Stylet Placement Confirmation: ETT inserted through vocal cords under direct vision, positive ETCO2, CO2 detector and breath sounds checked- equal and bilateral Secured at: 21 cm Tube secured with: Tape Dental Injury: Teeth and Oropharynx as per pre-operative assessment

## 2022-06-16 NOTE — Op Note (Signed)
Operative Note  Veronica [name] 31 y.o. female [id number]  06/16/2022  Surgeon: Berna Bue MD FACS (I performed the key and critical portions of this procedure and was present and scrubbed throughout the entire procedure, as documented in my operative note.)  Assistant: Zena Amos MD PGY3    Procedure performed: Laparoscopic Cholecystectomy  Preop diagnosis: Biliary colic Post-op diagnosis/intraop findings: chronic cholecystitis, hepatic steatosis  Specimens: gallbladder  Retained items: none  EBL: minimal  Complications: none  Description of procedure: After confirming informed consent the patient was brought to the operating room. Antibiotics were administered. SCD's were applied. General endotracheal anesthesia was initiated and a formal time-out was performed. The abdomen was prepped and draped in the usual sterile fashion and the abdomen was entered using an infraumbilical veress needle after instilling the site with local. Insufflation to was obtained, 62mm trocar and camera inserted, and gross inspection revealed no evidence of injury from our entry. There is a small amount of omentum adherent to the periumbilical abdominal wall and she has a notably enlarged liver with appearance consistent with hepatic steatosis. Two 40mm trocars were introduced in the right midclavicular and right anterior axillary lines under direct visualization and following infiltration with local. An 35mm trocar was placed in the epigastrium. The gallbladder fundus was retracted cephalad and the infundibulum was retracted laterally. Omental adhesions were carefully taken down with blunt dissection and cautery, taking care to protect the nearby duodenum, until the infundibulum could be retracted laterally. A combination of hook electrocautery and blunt dissection was utilized to clear the peritoneum from the neck and cystic duct, circumferentially isolating the cystic artery and cystic duct  and lifting the gallbladder from the cystic plate. The critical view of safety was achieved with the cystic artery, cystic duct, and liver bed visualized between them with no other structures. The artery was clipped with a two clips proximally and one distally and divided. The cystic duct was quite dilated and a stone was impacted. This was able to be milked back into the gallbladder. ICG had been injected and the anatomy was concordant on the firefly view; the common bile duct location was able to be visualized with ICG and was well away from our dissection. The gallbladder was dissected from the liver plate using electrocautery until the cystic duct was the only structure connecting the gallbladder to the patient. The cystic duct was ligated with two 0 PDS endoloops proximally and divided with scissors. Once freed the gallbladder was placed in an endocatch bag and removed intact through the epigastric trocar site. Hemostasis was once again confirmed, and reinspection of the abdomen revealed no injuries. The endoloops were well opposed without any bile leak from the duct or the liver bed on direct view nor with firefly. The 55mm trocar site in the epigastrium was closed with a 0 vicryl in the fascia under direct visualization using a PMI device. The abdomen was desufflated and all trocars removed. The skin incisions were closed with subcuticular 4-0 monocryl and Dermabond. The patient was awakened, extubated and transported to the recovery room in stable condition.    All counts were correct at the completion of the case.

## 2022-06-17 ENCOUNTER — Other Ambulatory Visit (HOSPITAL_COMMUNITY)
Admission: RE | Admit: 2022-06-17 | Discharge: 2022-06-17 | Disposition: A | Discharge status: Home / Self Care | Payer: Medicaid Other | Source: Ambulatory Visit | Attending: Family Medicine | Admitting: Family Medicine

## 2022-06-17 ENCOUNTER — Encounter (HOSPITAL_COMMUNITY): Payer: Self-pay | Admitting: Surgery

## 2022-06-17 ENCOUNTER — Ambulatory Visit (INDEPENDENT_AMBULATORY_CARE_PROVIDER_SITE_OTHER): Payer: Medicaid Other | Admitting: Family Medicine

## 2022-06-17 ENCOUNTER — Ambulatory Visit (INDEPENDENT_AMBULATORY_CARE_PROVIDER_SITE_OTHER): Payer: Medicaid Other | Admitting: Emergency Medicine

## 2022-06-17 ENCOUNTER — Encounter: Payer: Self-pay | Admitting: Emergency Medicine

## 2022-06-17 VITALS — BP 118/77 | HR 70 | Ht 62.0 in | Wt 240.0 lb

## 2022-06-17 DIAGNOSIS — N898 Other specified noninflammatory disorders of vagina: Secondary | ICD-10-CM

## 2022-06-17 DIAGNOSIS — A599 Trichomoniasis, unspecified: Secondary | ICD-10-CM | POA: Diagnosis present

## 2022-06-17 DIAGNOSIS — J4520 Mild intermittent asthma, uncomplicated: Secondary | ICD-10-CM

## 2022-06-17 HISTORY — DX: Trichomoniasis, unspecified: A59.9

## 2022-06-17 LAB — POCT WET PREP (WET MOUNT)
Clue Cells Wet Prep Whiff POC: NEGATIVE
WBC, Wet Prep HPF POC: >20

## 2022-06-17 LAB — SURGICAL PATHOLOGY

## 2022-06-17 MED ORDER — METRONIDAZOLE 500 MG PO TABS: 500.0000 mg | ORAL_TABLET | Freq: Two times a day (BID) | ORAL | 0 refills | Status: AC

## 2022-06-17 NOTE — Assessment & Plan Note (Signed)
Reassuring pulmonary function testing.  Suspect that some of her continued symptoms are upper airway in nature.  She did have more traditional asthma symptoms after COVID-19, while she was pregnant.  I think we can try stopping the Flovent to see how she tolerates.  She will keep albuterol available to use if needed.  We will try to control her exacerbating factors including GERD and rhinitis.  We reviewed your pulmonary function testing today.  This showed normal airflows with no evidence for active asthma Please stop your Flovent. Continue to keep albuterol available to use 2 puffs if needed for shortness of breath, chest tightness, wheezing. Continue your Protonix 20 mg once daily.  Agree with modifying your diet to help prevent esophageal reflux. Continue fluticasone nasal spray and Zyrtec as you have been taking them. We reviewed your sleep study today.  There is no evidence for obstructive sleep apnea. Follow Dr. Delton Coombes in 1 year.  Please call to be seen sooner if you have any flaring symptoms.

## 2022-06-17 NOTE — Patient Instructions (Signed)
It was nice seeing you today!  Will update you with results.  Stay well, Littie Deeds, MD [facility] [phone]  --  Make sure to check out at the front desk before you leave today.  Please arrive at least 15 minutes prior to your scheduled appointments.  If you had blood work today, I will send you a MyChart message or a letter if results are normal. Otherwise, I will give you a call.  If you had a referral placed, they will call you to set up an appointment. Please give [place] a call if you don't hear back in the next 2 weeks.  If you need additional refills before your next appointment, please call your pharmacy first.

## 2022-06-17 NOTE — Progress Notes (Signed)
Subjective:    Patient ID: Veronica [name], female    DOB: October 13, 1991, 31 y.o.   MRN: [id number]  HPI  ROV 03/07/22 --31 year old woman with a history of multifactorial shortness of breath in the setting of restrictive lung disease due to obesity and a hiatal hernia, upper airway irritation syndrome exacerbated by GERD and seasonal allergies, suspected asthma based on mid flow improvement on spirometry with bronchodilator.  She returns today for follow-up.  She reports that she had COVID-19 at the beginning of this month.  Describes fever, chills. She had a lot of cough, which has persisted. Completed a 5 days course pred. Received cough syrup, tessalon.   She has been on the flovent for several months. Was started when she was pregnant. Rare albuterol use Rare breakthrough GERD, on PPI. She remains on flonase prn, zyrtec.   She is on Protonix, Zyrtec, fluticasone nasal spray.    ROV 06/17/22 --31 year old woman who follows up today for mild intermittent asthma, chronic cough with component of upper airway irritation syndrome.  Exacerbated by GERD and seasonal allergies. We repeated her pulmonary function testing to assess degree of obstruction, help decide whether to continue her scheduled ICS - Flovent.  She is on cetirizine, fluticasone nasal spray, Protonix 20mg .  She is using albuterol approximately She underwent a laparoscopic cholecystectomy on 06/16/2022 for chronic cholecystitis  Pulmonary function testing performed on 06/09/2022 showed normal airflows without a bronchodilator response, normal lung volumes and normal diffusion capacity  Sleep study 06/08/2022 showed a total sleep time of 347.5 minutes, AHI 0/h without any evidence for obstructive sleep apnea, no desaturations.   Review of Systems As per HPI  Past Medical History:  Diagnosis Date   Abdominal pain 01/06/2012   Acute tension headache 07/27/2019   Anemia    Ankle pain    Anxiety    Asthma    Birth control  counseling 08/26/2011   BMI 40.0-44.9, adult (HCC) 02/27/2011   GERD (gastroesophageal reflux disease)    Heart murmur    Hiatal hernia    Obesity (BMI 30-39.9)    Pre-eclampsia    off med since 4/23   Secondary amenorrhea 03/25/2018     Family History  Problem Relation Age of Onset   Diabetes Mother    Hypertension Mother    Obesity Mother    Heart attack Mother 79       Death   Obesity Sister    Osteochondroma Sister    Diabetes Sister    Hypertension Sister      Social History   Socioeconomic History   Marital status: Significant Other    Spouse name: Not on file   Number of children: Not on file   Years of education: Not on file   Highest education level: Not on file  Occupational History   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    Attending [employer] to obtain GED.  Mom's fiance smokes outside house.    Pet: dog               Social Determinants of Health   Financial Resource Strain: Low Risk  (01/29/2022)   Overall Financial Resource Strain (CARDIA)    Difficulty of Paying Living Expenses: Not hard at all  Food Insecurity: No Food Insecurity (08/13/2021)   Hunger Vital Sign    Worried About Running Out of Food in the Last Year: Never true    Ran Out of Food in the Last Year: Never true  Transportation Needs: No Transportation Needs (08/13/2021)   PRAPARE - Administrator, Civil Service (Medical): No    Lack of Transportation (Non-Medical): No  Physical  Activity: Sufficiently Active (01/29/2022)   Exercise Vital Sign    Days of Exercise per Week: 3 days    Minutes of Exercise per Session: 60 min  Stress: Stress Concern Present (01/29/2022)   [employer] of Occupational Health - Occupational Stress Questionnaire    Feeling of Stress : Very much  Social Connections: Socially Integrated (01/29/2022)   Social Connection and Isolation Panel [NHANES]    Frequency of Communication with Friends and Family: Three times a week    Frequency of Social Gatherings with Friends and Family: Twice a week    Attends Religious Services: More than 4 times per year    Active Member of [employer] or Organizations: Yes    Attends Banker Meetings: More than 4 times per year    Marital Status: Living with partner  Intimate Partner Violence: Not At Risk (01/29/2022)   Humiliation, Afraid, Rape, and Kick questionnaire    Fear of Current or Ex-Partner: No    Emotionally Abused: No    Physically Abused: No    Sexually Abused: No   She works in Teacher, 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  [place], [place] as a child.  No military Some exposure to cleaning solutions No other exposures.    Allergies  Allergen Reactions   Morphine And Related Other (See Comments)    Causes "chest to be heavy"   Motrin [Ibuprofen]     Was told to not take this because it would flare stomach issues     Outpatient Medications Prior to Visit  Medication Sig Dispense Refill   acetaminophen (TYLENOL) 500 MG tablet Take 2 tablets (1,000 mg total) by mouth every 6 (six) hours as needed for moderate pain. 60 tablet 0   albuterol (VENTOLIN HFA) 108 (90 Base) MCG/ACT inhaler INHALE 2 PUFFS BY MOUTH EVERY 4 HOURS AS NEEDED FOR WHEEZING OR SHORTNESS OF BREATH (COUGH). 18 g 0   cetirizine (ZYRTEC) 10 MG tablet Take 10 mg by mouth daily.     docusate sodium (COLACE) 100 MG capsule Take 1 capsule (100 mg total) by mouth 2 (two) times daily. (Patient taking  differently: Take 100 mg by mouth daily.) 60 capsule 1   escitalopram (LEXAPRO) 10 MG tablet Take 1 tablet (10 mg total) by mouth daily. 30 tablet 3   etonogestrel (NEXPLANON) 68 MG IMPL implant 1 each by Subdermal route once.     ferrous sulfate 325 (65 FE) MG tablet Take 1 tablet (325 mg total) by mouth every other day. (Patient taking differently: Take 325 mg by mouth daily.) 30 tablet 1   fluticasone (FLONASE) 50 MCG/ACT nasal spray Place 1 spray into both nostrils daily as needed for allergies or rhinitis. 1 spray in each nostril every day (Patient taking  differently: Place 1 spray into both nostrils daily. 1 spray in each nostril every day) 16 g 3   fluticasone (FLOVENT HFA) 110 MCG/ACT inhaler Inhale 2 puffs into the lungs 2 (two) times daily. 1 each 12   hydrOXYzine (ATARAX) 25 MG tablet Take 1 tablet (25 mg total) by mouth 3 (three) times daily as needed for anxiety. 90 tablet 1   metroNIDAZOLE (FLAGYL) 500 MG tablet Take 1 tablet (500 mg total) by mouth 2 (two) times daily for 7 days. 14 tablet 0   Multiple Vitamins-Minerals (ONE-A-DAY WOMENS PO) Take 1 tablet by mouth daily.     pantoprazole (PROTONIX) 20 MG tablet Take 20 mg by mouth daily.     polyethylene glycol (MIRALAX / GLYCOLAX) 17 g packet Take 17 g by mouth daily. (Patient taking differently: Take 17 g by mouth daily as needed for moderate constipation.) 14 each 1   traMADol (ULTRAM) 50 MG tablet Take 1 tablet (50 mg total) by mouth every 6 (six) hours as needed for up to 5 days (pain not relieved by tylenol, ibuprofen, rest or ice). 20 tablet 0   No facility-administered medications prior to visit.        Objective:   Physical Exam Vitals:   06/17/22 1619  BP: 128/74  Pulse: 76  Temp: 98.8 F (37.1 C)  TempSrc: Oral  SpO2: 97%  Weight: 240 lb 6.4 oz (109 kg)  Height: 5\' 2"  (1.575 m)   Gen: Pleasant overweight woman, in no distress,  normal affect  ENT: No lesions,  mouth clear, tongue piercings, oropharynx clear,  no postnasal drip  Neck: No JVD, no stridor  Lungs: No use of accessory muscles, no crackles or wheezing on normal respiration, no wheeze on forced expiration  Cardiovascular: RRR, heart sounds normal, no murmur or gallops, no peripheral edema  Musculoskeletal: No deformities, no cyanosis or clubbing  Neuro: alert, awake, non focal  Skin: Warm, no lesions or rash      Assessment & Plan:  Asthma Reassuring pulmonary function testing.  Suspect that some of her continued symptoms are upper airway in nature.  She did have more traditional asthma symptoms after COVID-19, while she was pregnant.  I think we can try stopping the Flovent to see how she tolerates.  She will keep albuterol available to use if needed.  We will try to control her exacerbating factors including GERD and rhinitis.  We reviewed your pulmonary function testing today.  This showed normal airflows with no evidence for active asthma Please stop your Flovent. Continue to keep albuterol available to use 2 puffs if needed for shortness of breath, chest tightness, wheezing. Continue your Protonix 20 mg once daily.  Agree with modifying your diet to help prevent esophageal reflux. Continue fluticasone nasal spray and Zyrtec as you have been taking them. We reviewed your sleep study today.  There is no evidence for obstructive sleep apnea. Follow Dr. in 1 year.  Please call to be seen sooner if you have any flaring symptoms.    Delton Coombes, MD, PhD 06/17/2022, 4:46 PM [facility] [phone] or if no answer [phone]

## 2022-06-17 NOTE — Progress Notes (Signed)
    SUBJECTIVE:   CHIEF COMPLAINT / HPI:  Chief Complaint  Patient presents with   Vaginal Itching    X3 days    Reports vaginal itching and burning for 3 days. Also reports watery vaginal discharge but appears brown on her underwear. Last sexual intercourse was 4 weeks ago, denies new partners in the past year. Denies fever, chills.  She had gallbladder surgery yesterday.  PERTINENT  PMH / PSH: history of trichomonas  Patient Care Team: Cora Collum, DO as PCP - General (Family Medicine) Jake Bathe, MD as PCP - Cardiology (Cardiology)   OBJECTIVE:   BP 118/77   Pulse 70   Ht 5\' 2"  (1.575 m)   Wt 240 lb (108.9 kg)   LMP 06/03/2022 (Exact Date)   SpO2 98%   BMI 43.90 kg/m   Physical Exam Exam conducted with a chaperone present.  Constitutional:      General: She is not in acute distress. Cardiovascular:     Rate and Rhythm: Normal rate.  Pulmonary:     Effort: Pulmonary effort is normal. No respiratory distress.  Genitourinary:    Cervix: Normal.     Adnexa:        Right: No tenderness.         Left: No tenderness.       Comments: Thin white discharge noted in the vaginal vault. Musculoskeletal:     Cervical back: Neck supple.  Neurological:     Mental Status: She is alert.         05/19/2022    2:01 PM  Depression screen PHQ 2/9  Decreased Interest 0  Down, Depressed, Hopeless 0  PHQ - 2 Score 0  Altered sleeping 1  Tired, decreased energy 0  Change in appetite 1  Feeling bad or failure about yourself  0  Trouble concentrating 0  Moving slowly or fidgety/restless 0  Suicidal thoughts 0  PHQ-9 Score 2  Difficult doing work/chores Not difficult at all     {Show previous vital signs (optional):23777}    ASSESSMENT/PLAN:   Trichomonas infection Noted on wet prep. Discussed results with patient over the phone. - metronidazole 500 mg BID x 7d - abstain from sexual intercourse at least 7 days after treatment - advised partner treatment,  can send in treatment if needed - retest in 3 months - HIV, RPR pending - GC/chlamydia pending    Return if symptoms worsen or fail to improve.   4/9, MD [facility]

## 2022-06-17 NOTE — Patient Instructions (Signed)
We reviewed your pulmonary function testing today.  This showed normal airflows with no evidence for active asthma Please stop your Flovent. Continue to keep albuterol available to use 2 puffs if needed for shortness of breath, chest tightness, wheezing. Continue your Protonix 20 mg once daily.  Agree with modifying your diet to help prevent esophageal reflux. Continue fluticasone nasal spray and Zyrtec as you have been taking them. We reviewed your sleep study today.  There is no evidence for obstructive sleep apnea. Follow Dr. Delton Coombes in 1 year.  Please call to be seen sooner if you have any flaring symptoms.

## 2022-06-17 NOTE — Assessment & Plan Note (Signed)
Noted on wet prep. Discussed results with patient over the phone. - metronidazole 500 mg BID x 7d - abstain from sexual intercourse at least 7 days after treatment - advised partner treatment, can send in treatment if needed - retest in 3 months - HIV, RPR pending - GC/chlamydia pending

## 2022-06-18 LAB — HIV ANTIBODY (ROUTINE TESTING W REFLEX): HIV Screen 4th Generation wRfx: NONREACTIVE

## 2022-06-18 LAB — CERVICOVAGINAL ANCILLARY ONLY
Chlamydia: NEGATIVE
Comment: NEGATIVE
Comment: NEGATIVE
Comment: NORMAL
Neisseria Gonorrhea: NEGATIVE
Trichomonas: POSITIVE — AB

## 2022-06-18 LAB — RPR: RPR Ser Ql: NONREACTIVE

## 2022-06-30 ENCOUNTER — Ambulatory Visit (INDEPENDENT_AMBULATORY_CARE_PROVIDER_SITE_OTHER): Payer: Medicaid Other | Admitting: Family Medicine

## 2022-06-30 ENCOUNTER — Encounter: Payer: Self-pay | Admitting: Family Medicine

## 2022-06-30 DIAGNOSIS — F419 Anxiety disorder, unspecified: Secondary | ICD-10-CM

## 2022-06-30 DIAGNOSIS — K219 Gastro-esophageal reflux disease without esophagitis: Secondary | ICD-10-CM | POA: Diagnosis not present

## 2022-06-30 DIAGNOSIS — D649 Anemia, unspecified: Secondary | ICD-10-CM

## 2022-06-30 MED ORDER — FAMOTIDINE 20 MG PO TABS: 20.0000 mg | ORAL_TABLET | Freq: Every day | ORAL | 0 refills | Status: DC

## 2022-06-30 MED ORDER — FERROUS SULFATE 325 (65 FE) MG PO TABS: 325.0000 mg | ORAL_TABLET | ORAL | 1 refills | Status: DC

## 2022-06-30 NOTE — Patient Instructions (Addendum)
It was great seeing you today!  Glad you are feeling well after your surgery!  I have refilled some of your medication.  Try to reduce the amount of fried foods and sweets as we discussed.    You can drop off the Medicaid form and I will sign it and we will fax it to them.  Visit Reminders: - Stop by the pharmacy to pick up your prescriptions  - Continue to work on your healthy eating habits and incorporating exercise into your daily life.   Feel free to call with any questions or concerns at any time, at [phone].   Take care,  Dr. Cora Collum [facility]

## 2022-06-30 NOTE — Progress Notes (Unsigned)
foll

## 2022-06-30 NOTE — Progress Notes (Unsigned)
    SUBJECTIVE:   CHIEF COMPLAINT / HPI:   Patient presents for follow up after her cholecystectomy.States she has been feeling ok sicne her gall bladder removal. States she still is still eating fried foods. Drinking lemonade and gingerale. States she has been eating a lot of sweets lately. Denies any increased stress  Denies any abdominal pain. Does endorse having increased heartburn since surgery.   Takes Lexapro 10mg  daily feels mood has been good. No recent panic attacks. Tolerating medication well  Discussed sleep study results  Requests refill of iron   PERTINENT  PMH / PSH: Reviewed   OBJECTIVE:   BP 114/74   Pulse 75   Resp 18   Wt 242 lb 2 oz (109.8 kg)   LMP 06/03/2022 (Exact Date)   SpO2 100%   BMI 44.29 kg/m    General: alert, pleasant, NAD CV: RRR no murmurs  Resp: CTAB normal WOB GI: soft, non distended, non tender Derm: warm, dry. No LE edema   ASSESSMENT/PLAN:   GERD (gastroesophageal reflux disease) Suspect fatty foods and alcohol are contributing to worsening symptoms particularly after recent cholecystectomy. No abdominal pain. Normal Bms. Discussed avoiding triggers. Will trial a different acid blocker and switch from protonix to pepcid 20mg  daily.   Anxiety Currently on Lexapro 10mg  recently increased from 5mg  and tolerating well. Feels improvement in mood. Will continue current regimen.      06/05/2022, DO [facility]

## 2022-07-01 NOTE — Assessment & Plan Note (Signed)
Suspect fatty foods and alcohol are contributing to worsening symptoms particularly after recent cholecystectomy. No abdominal pain. Normal Bms. Discussed avoiding triggers. Will trial a different acid blocker and switch from protonix to pepcid 20mg  daily.

## 2022-07-01 NOTE — Assessment & Plan Note (Signed)
>>  ASSESSMENT AND PLAN FOR ANXIETY WRITTEN ON 07/01/2022 10:24 PM BY PAIGE, VICTORIA J, DO  Currently on Lexapro 10mg  recently increased from 5mg  and tolerating well. Feels improvement in mood. Will continue current regimen.

## 2022-07-01 NOTE — Assessment & Plan Note (Signed)
Currently on Lexapro 10mg  recently increased from 5mg  and tolerating well. Feels improvement in mood. Will continue current regimen.

## 2022-07-08 DIAGNOSIS — Z419 Encounter for procedure for purposes other than remedying health state, unspecified: Secondary | ICD-10-CM | POA: Diagnosis not present

## 2022-08-01 ENCOUNTER — Encounter: Payer: Self-pay | Admitting: Family Medicine

## 2022-08-01 ENCOUNTER — Ambulatory Visit (INDEPENDENT_AMBULATORY_CARE_PROVIDER_SITE_OTHER): Payer: Medicaid Other | Admitting: Family Medicine

## 2022-08-01 ENCOUNTER — Other Ambulatory Visit: Payer: Self-pay

## 2022-08-01 VITALS — BP 119/70 | HR 75 | Wt 251.4 lb

## 2022-08-01 DIAGNOSIS — A599 Trichomoniasis, unspecified: Secondary | ICD-10-CM

## 2022-08-01 DIAGNOSIS — Z6841 Body Mass Index (BMI) 40.0 and over, adult: Secondary | ICD-10-CM | POA: Diagnosis not present

## 2022-08-01 DIAGNOSIS — R300 Dysuria: Secondary | ICD-10-CM | POA: Diagnosis not present

## 2022-08-01 LAB — POCT GLYCOSYLATED HEMOGLOBIN (HGB A1C): Hemoglobin A1C: 5.3 % (ref 4.0–5.6)

## 2022-08-01 LAB — POCT URINALYSIS DIP (MANUAL ENTRY)
Bilirubin, UA: NEGATIVE
Glucose, UA: NEGATIVE mg/dL
Ketones, POC UA: NEGATIVE mg/dL
Nitrite, UA: NEGATIVE
Spec Grav, UA: 1.02 (ref 1.010–1.025)
Urobilinogen, UA: 0.2 E.U./dL
pH, UA: 6.5 (ref 5.0–8.0)

## 2022-08-01 LAB — POCT UA - MICROSCOPIC ONLY
Epithelial cells, urine per micros: >20
WBC, Ur, HPF, POC: >20 (ref 0–5)

## 2022-08-01 MED ORDER — CEPHALEXIN 500 MG PO CAPS: 500.0000 mg | ORAL_CAPSULE | Freq: Four times a day (QID) | ORAL | 0 refills | Status: AC

## 2022-08-01 NOTE — Assessment & Plan Note (Signed)
Health maintenance - A1c - Lipid panel

## 2022-08-01 NOTE — Progress Notes (Signed)
    SUBJECTIVE:   CHIEF COMPLAINT / HPI:   Patient has been having suprapubic pressure. Discomfort and pain when peeing. Has not seen hematuria. Pt mentioned symptoms for about one week. No subjective fevers. Mentions increased frequency and feels like she has incomplete emptying. Patient had urosepsis in March and was hospitalized and received antibiotics. Had trichomonas one month ago.   Had sexual intercourse last Friday and did not urinate afterwards.   No back pain. No nausea or vomiting.   PERTINENT  PMH / PSH: Obesity, Ecoli urosepsis 1 month prior  OBJECTIVE:   BP 119/70   Pulse 75   Wt 251 lb 6.4 oz (114 kg)   SpO2 99%   BMI 45.98 kg/m   General: Well-appearing CV: RRR, well perfused Respiratory: Normal work of breathing, clear to auscultation bilaterally Abdomen: Soft, nondistended, nontender to palpation   ASSESSMENT/PLAN:   BMI 50.0-59.9, adult (HCC) Health maintenance - A1c - Lipid panel  Trichomonas infection No current symptoms - Follow-up in 2 months for 27-month post check  Dysuria Most likely UTI given urinalysis positive for leukocytes and blood.  Most likely simple given patient denies CVA tenderness and no fevers.  Recent urosepsis with E. coli. - Keflex 4 times daily for 7 days - Counseled patient on strict return precautions     Lockie Mola, MD [facility]

## 2022-08-01 NOTE — Assessment & Plan Note (Signed)
No current symptoms - Follow-up in 2 months for 43-month post check

## 2022-08-01 NOTE — Patient Instructions (Signed)
It was great to see you today! Thank you for choosing [facility] for your primary care. Veronica [name] was seen for urinary discomfort .  Today we addressed: Urinary issues - you have a urinary tract infection. I have sent in antibiotics for you to take. Please take the tablet 4 times a day for 7 days. Please go to the emergency room if you have worsening pain, stop being able to pee, have back pain, or fevers.  For your trichomonas please make an appointment in 2 months to recheck.   If you haven't already, sign up for My Chart to have easy access to your labs results, and communication with your primary care physician.  We are checking some labs today. If they are abnormal, I will call you. If they are normal, I will send you a MyChart message (if it is active) or a letter in the mail. If you do not hear about your labs in the next 2 weeks, please call the office.   You should return to our clinic Return in about 2 months (around 10/01/2022) for trichomonas recheck .  I recommend that you always bring your medications to each appointment as this makes it easy to ensure you are on the correct medications and helps [place] not miss refills when you need them.  Please arrive 15 minutes before your appointment to ensure smooth check in process.  We appreciate your efforts in making this happen.  Please call the clinic at [phone] if your symptoms worsen or you have any concerns.  Thank you for allowing me to participate in your care, Lockie Mola, MD 08/01/2022, 4:46 PM PGY-1, [facility]

## 2022-08-01 NOTE — Assessment & Plan Note (Signed)
Most likely UTI given urinalysis positive for leukocytes and blood.  Most likely simple given patient denies CVA tenderness and no fevers.  Recent urosepsis with E. coli. - Keflex 4 times daily for 7 days - Counseled patient on strict return precautions

## 2022-08-02 LAB — LIPID PANEL
Chol/HDL Ratio: 3.3 ratio (ref 0.0–4.4)
Cholesterol, Total: 120 mg/dL (ref 100–199)
HDL: 36 mg/dL — ABNORMAL LOW (ref >39)
LDL Chol Calc (NIH): 68 mg/dL (ref 0–99)
Triglycerides: 82 mg/dL (ref 0–149)
VLDL Cholesterol Cal: 16 mg/dL (ref 5–40)

## 2022-08-04 LAB — URINE CULTURE

## 2022-08-06 ENCOUNTER — Other Ambulatory Visit: Payer: Self-pay | Admitting: Family Medicine

## 2022-08-08 ENCOUNTER — Encounter (HOSPITAL_COMMUNITY): Payer: Self-pay

## 2022-08-08 ENCOUNTER — Ambulatory Visit (HOSPITAL_COMMUNITY)
Admission: EM | Admit: 2022-08-08 | Discharge: 2022-08-08 | Disposition: A | Discharge status: Home / Self Care | Payer: Medicaid Other | Attending: Family Medicine | Admitting: Family Medicine

## 2022-08-08 DIAGNOSIS — Z113 Encounter for screening for infections with a predominantly sexual mode of transmission: Secondary | ICD-10-CM | POA: Insufficient documentation

## 2022-08-08 DIAGNOSIS — B3731 Acute candidiasis of vulva and vagina: Secondary | ICD-10-CM | POA: Insufficient documentation

## 2022-08-08 DIAGNOSIS — Z20822 Contact with and (suspected) exposure to covid-19: Secondary | ICD-10-CM | POA: Insufficient documentation

## 2022-08-08 DIAGNOSIS — Z419 Encounter for procedure for purposes other than remedying health state, unspecified: Secondary | ICD-10-CM | POA: Diagnosis not present

## 2022-08-08 DIAGNOSIS — B379 Candidiasis, unspecified: Secondary | ICD-10-CM | POA: Diagnosis not present

## 2022-08-08 DIAGNOSIS — N898 Other specified noninflammatory disorders of vagina: Secondary | ICD-10-CM | POA: Diagnosis not present

## 2022-08-08 LAB — HEPATITIS C ANTIBODY: HCV Ab: NONREACTIVE

## 2022-08-08 LAB — POCT URINALYSIS DIPSTICK, ED / UC
Bilirubin Urine: NEGATIVE
Glucose, UA: NEGATIVE mg/dL
Hgb urine dipstick: NEGATIVE
Ketones, ur: NEGATIVE mg/dL
Nitrite: NEGATIVE
Protein, ur: NEGATIVE mg/dL
Specific Gravity, Urine: 1.015 (ref 1.005–1.030)
Urobilinogen, UA: 0.2 mg/dL (ref 0.0–1.0)
pH: 7 (ref 5.0–8.0)

## 2022-08-08 LAB — RPR: RPR Ser Ql: NONREACTIVE

## 2022-08-08 LAB — HIV ANTIBODY (ROUTINE TESTING W REFLEX): HIV Screen 4th Generation wRfx: NONREACTIVE

## 2022-08-08 MED ORDER — FLUCONAZOLE 150 MG PO TABS: ORAL_TABLET | ORAL | 0 refills | Status: DC

## 2022-08-08 NOTE — Discharge Instructions (Addendum)
You were seen today for vaginal discharge.  This is likely a yeast infection.  I have sent out diflucan for you today.  Your swab and blood work will be resulted tomorrow.  If there is anything more to treat we will call and notify you.

## 2022-08-08 NOTE — ED Provider Notes (Addendum)
[place]    CSN: [id number] Arrival date & time: 08/08/22  0854      History   Chief Complaint Chief Complaint  Patient presents with   Vaginal Discharge   Vaginal Itching   Dysuria    HPI Veronica [name] is a 31 y.o. female.   Patient is here for vaginal itching and burning x 5 days.  Some white clumpy d/c noted today.  No painful urination, only with wiping.  She was seen last week Friday for urinary symptoms, on abx currently for this.  Pansensative, on keflex.  New sexual partner, no condoms use.  No known exposure.    Past Medical History:  Diagnosis Date   Abdominal pain 01/06/2012   Acute tension headache 07/27/2019   Anemia    Ankle pain    Anxiety    Asthma    Birth control counseling 08/26/2011   BMI 40.0-44.9, adult ([place]) 02/27/2011   GERD (gastroesophageal reflux disease)    Heart murmur    Hiatal hernia    Obesity (BMI 30-39.9)    Pre-eclampsia    off med since 4/23   Secondary amenorrhea 03/25/2018    Patient Active Problem List   Diagnosis Date Noted   Trichomonas infection 06/17/2022   Nexplanon insertion 05/19/2022   Intertrigo 04/17/2022   Health maintenance examination 04/01/2022   Nocturnal oxygen desaturation 03/23/2022   AKI (acute kidney injury) ([place]) 03/23/2022   Gallstones    Sepsis ([place])    Pyelonephritis 03/10/2022   Allergic rhinitis 03/07/2022   Anxiety 11/26/2020   Asthma 08/31/2020   Back pain 02/13/2020   Hiatal hernia 11/02/2019   Epigastric pain 09/13/2019   Subclinical hypothyroidism 05/19/2019   GAD (generalized anxiety disorder) 05/09/2019   Chronic cough 10/16/2017   Dysuria 01/13/2013   GERD (gastroesophageal reflux disease) 03/28/2011   BMI 50.0-59.9, adult ([place]) 02/27/2011   Anemia 02/27/2011    Past Surgical History:  Procedure Laterality Date   CESAREAN SECTION  09/02/2010   For macrosomia, but son was 7 lb 12 oz   CESAREAN SECTION N/A 10/06/2021   Procedure: CESAREAN SECTION;  Surgeon:  Aletha Halim, MD;  Location: [facility] ORS;  Service: Obstetrics;  Laterality: N/A;   CHOLECYSTECTOMY N/A 06/16/2022   Procedure: LAPAROSCOPIC  CHOLECYSTECTOMY;  Surgeon: Clovis Riley, MD;  Location: [facility] ORS;  Service: General;  Laterality: N/A;   TONSILLECTOMY AND ADENOIDECTOMY      OB History     Gravida  2   Para  2   Term  2   Preterm      AB      Living  2      SAB      IAB      Ectopic      Multiple  0   Live Births  2            Home Medications    Prior to Admission medications   Medication Sig Start Date End Date Taking? Authorizing Provider  albuterol (VENTOLIN HFA) 108 (90 Base) MCG/ACT inhaler INHALE 2 PUFFS BY MOUTH EVERY 4 HOURS AS NEEDED FOR WHEEZING OR SHORTNESS OF BREATH (COUGH). 10/15/20  Yes Darrelyn Hillock N, DO  cetirizine (ZYRTEC) 10 MG tablet Take 10 mg by mouth daily.   Yes [provider]  escitalopram (LEXAPRO) 10 MG tablet Take 1 tablet (10 mg total) by mouth daily. 02/05/22  Yes Paige, Weldon Picking, DO  etonogestrel (NEXPLANON) 68 MG IMPL implant 1 each by Subdermal  route once.   Yes [provider]  famotidine (PEPCID) 20 MG tablet Take 1 tablet by mouth once daily 08/07/22  Yes Paige, Victoria J, DO  ferrous sulfate 325 (65 FE) MG tablet Take 1 tablet (325 mg total) by mouth every other day. 06/30/22  Yes Paige, Lucas Mallow, DO  hydrOXYzine (ATARAX) 25 MG tablet Take 1 tablet (25 mg total) by mouth 3 (three) times daily as needed for anxiety. 01/30/22  Yes Pashayan, Mardelle Matte, MD  Multiple Vitamins-Minerals (ONE-A-DAY WOMENS PO) Take 1 tablet by mouth daily.   Yes [provider]  polyethylene glycol (MIRALAX / GLYCOLAX) 17 g packet Take 17 g by mouth daily. 06/01/21  Yes Gerrit Heck, CNM  acetaminophen (TYLENOL) 500 MG tablet Take 2 tablets (1,000 mg total) by mouth every 6 (six) hours as needed for moderate pain. Patient not taking: Reported on 08/01/2022 10/09/21   Cheral Marker, CNM  cephALEXin (KEFLEX)  500 MG capsule Take 1 capsule (500 mg total) by mouth 4 (four) times daily for 7 days. 08/01/22 08/08/22  Lockie Mola, MD  docusate sodium (COLACE) 100 MG capsule Take 1 capsule (100 mg total) by mouth 2 (two) times daily. Patient not taking: Reported on 08/01/2022 06/01/21   Gerrit Heck, CNM  fluticasone [facility]) 50 MCG/ACT nasal spray Place 1 spray into both nostrils daily as needed for allergies or rhinitis. 1 spray in each nostril every day Patient taking differently: Place 1 spray into both nostrils daily. 1 spray in each nostril every day 04/07/22   Cora Collum, DO  fluticasone (FLOVENT HFA) 110 MCG/ACT inhaler Inhale 2 puffs into the lungs 2 (two) times daily. 12/20/21   Littie Deeds, MD  pantoprazole (PROTONIX) 20 MG tablet Take 20 mg by mouth daily. Patient not taking: Reported on 08/01/2022 02/20/22   [provider]    Family History Family History  Problem Relation Age of Onset   Diabetes Mother    Hypertension Mother    Obesity Mother    Heart attack Mother 20       Death   Obesity Sister    Osteochondroma Sister    Diabetes Sister    Hypertension Sister     Social History Social History   Tobacco Use   Smoking status: Never   Smokeless tobacco: Never  Vaping Use   Vaping Use: Never used  Substance Use Topics   Alcohol use: Yes    Comment: occ   Drug use: Not Currently    Comment: last used in teens     Allergies   Morphine and related and Motrin [ibuprofen]   Review of Systems Review of Systems  Constitutional: Negative.   HENT: Negative.    Respiratory: Negative.    Cardiovascular: Negative.   Gastrointestinal: Negative.   Genitourinary:  Positive for dysuria and vaginal discharge.     Physical Exam Triage Vital Signs ED Triage Vitals  Enc Vitals Group     BP 08/08/22 0925 106/73     Pulse Rate 08/08/22 0925 (!) 58     Resp 08/08/22 0925 16     Temp 08/08/22 0925 99.2 F (37.3 C)     Temp Source 08/08/22 0925 Oral     SpO2  08/08/22 0925 98 %     Weight 08/08/22 0927 252 lb (114.3 kg)     Height 08/08/22 0927 5\' 2"  (1.575 m)     Head Circumference --      Peak Flow --  Pain Score 08/08/22 0927 6     Pain Loc --      Pain Edu? --      Excl. in GC? --    No data found.  Updated Vital Signs BP 106/73 (BP Location: Left Arm)   Pulse (!) 58   Temp 99.2 F (37.3 C) (Oral)   Resp 16   Ht 5\' 2"  (1.575 m)   Wt 114.3 kg   LMP 05/24/2022 (Approximate)   SpO2 98%   Breastfeeding No   BMI 46.09 kg/m   Visual Acuity Right Eye Distance:   Left Eye Distance:   Bilateral Distance:    Right Eye Near:   Left Eye Near:    Bilateral Near:     Physical Exam Constitutional:      Appearance: Normal appearance.  Cardiovascular:     Rate and Rhythm: Normal rate and regular rhythm.  Pulmonary:     Effort: Pulmonary effort is normal.  Abdominal:     Palpations: Abdomen is soft.     Tenderness: There is no abdominal tenderness. There is no right CVA tenderness or left CVA tenderness.  Skin:    General: Skin is warm.  Neurological:     General: No focal deficit present.     Mental Status: She is alert.  Psychiatric:        Mood and Affect: Mood normal.      UC Treatments / Results  Labs (all labs ordered are listed, but only abnormal results are displayed) Labs Reviewed  POCT URINALYSIS DIPSTICK, ED / UC - Abnormal; Notable for the following components:      Result Value   Leukocytes,Ua MODERATE (*)    All other components within normal limits  CERVICOVAGINAL ANCILLARY ONLY    EKG   Radiology No results found.  Procedures Procedures (including critical care time)  Medications Ordered in UC Medications - No data to display  Initial Impression / Assessment and Plan / UC Course  I have reviewed the triage vital signs and the nursing notes.  Pertinent labs & imaging results that were available during my care of the patient were reviewed by me and considered in my medical decision  making (see chart for details).  After the visit the patient requested covid testing.  No symptoms, but her boss is asking for this.  This was done today as requested.    Final Clinical Impressions(s) / UC Diagnoses   Final diagnoses:  Vaginal discharge  Yeast infection  Screening examination for STD (sexually transmitted disease)     Discharge Instructions      You were seen today for vaginal discharge.  This is likely a yeast infection.  I have sent out diflucan for you today.  Your swab and blood work will be resulted tomorrow.  If there is anything more to treat we will call and notify you.     ED Prescriptions     Medication Sig Dispense Auth. Provider   fluconazole (DIFLUCAN) 150 MG tablet Take 1 tab po today, and repeat in 3 days 2 tablet 05/26/2022, MD      PDMP not reviewed this encounter.   Jannifer Franklin, MD 08/08/22 10/08/22    [id number], MD 08/08/22 1001

## 2022-08-08 NOTE — ED Triage Notes (Signed)
Patient having vaginal itching and burning for the last 5 days. Patient states she has a new sexual partner. Their partner is not having symptoms.  Patient having brown discharge and burning with urination. Patient having low back pain on the left side.

## 2022-08-09 LAB — SARS CORONAVIRUS 2 (TAT 6-24 HRS): SARS Coronavirus 2: NEGATIVE

## 2022-08-12 ENCOUNTER — Telehealth (HOSPITAL_COMMUNITY): Payer: Self-pay | Admitting: Emergency Medicine

## 2022-08-12 LAB — CERVICOVAGINAL ANCILLARY ONLY
Bacterial Vaginitis (gardnerella): POSITIVE — AB
Candida Glabrata: NEGATIVE
Candida Vaginitis: POSITIVE — AB
Chlamydia: NEGATIVE
Comment: NEGATIVE
Comment: NEGATIVE
Comment: NEGATIVE
Comment: NEGATIVE
Comment: NEGATIVE
Comment: NORMAL
Neisseria Gonorrhea: NEGATIVE
Trichomonas: NEGATIVE

## 2022-08-12 MED ORDER — FLUCONAZOLE 150 MG PO TABS: 150.0000 mg | ORAL_TABLET | Freq: Once | ORAL | 0 refills | Status: AC

## 2022-08-12 MED ORDER — METRONIDAZOLE 500 MG PO TABS: 500.0000 mg | ORAL_TABLET | Freq: Two times a day (BID) | ORAL | 0 refills | Status: DC

## 2022-08-20 ENCOUNTER — Telehealth: Payer: Self-pay | Admitting: Emergency Medicine

## 2022-08-20 NOTE — Telephone Encounter (Signed)
Patient called pharmacy for albuterol inhaler refill, but she did not remember the providers name. Please advise on refill of albuterol sent to [facility] on [address].

## 2022-08-22 MED ORDER — ALBUTEROL SULFATE HFA 108 (90 BASE) MCG/ACT IN AERS: INHALATION_SPRAY | RESPIRATORY_TRACT | 5 refills | Status: DC

## 2022-08-22 NOTE — Telephone Encounter (Signed)
Refill of albuterol sent. Nothing further needed

## 2022-08-29 ENCOUNTER — Other Ambulatory Visit: Payer: Self-pay | Admitting: Family Medicine

## 2022-08-29 MED ORDER — FLUTICASONE PROPIONATE 50 MCG/ACT NA SUSP: 2.0000 | Freq: Every day | NASAL | 6 refills | Status: DC

## 2022-08-29 MED ORDER — ALBUTEROL SULFATE HFA 108 (90 BASE) MCG/ACT IN AERS
INHALATION_SPRAY | RESPIRATORY_TRACT | 5 refills | Status: DC
Start: 2022-08-29 — End: 2023-01-30

## 2022-08-29 NOTE — Progress Notes (Signed)
Spoke to patient during son's well-child check, and she requests for albuterol and Flonase to be sent to pharmacy.  Refilling prescriptions.

## 2022-09-07 DIAGNOSIS — Z419 Encounter for procedure for purposes other than remedying health state, unspecified: Secondary | ICD-10-CM | POA: Diagnosis not present

## 2022-09-08 ENCOUNTER — Ambulatory Visit (INDEPENDENT_AMBULATORY_CARE_PROVIDER_SITE_OTHER): Payer: Medicaid Other

## 2022-09-08 DIAGNOSIS — Z23 Encounter for immunization: Secondary | ICD-10-CM | POA: Diagnosis not present

## 2022-09-08 NOTE — Progress Notes (Signed)
Patient presents to nurse clinic for flu vaccination. Administered in RD, site unremarkable, tolerated injection well.   Safia Panzer C Flint Hakeem, RN  

## 2022-09-11 ENCOUNTER — Emergency Department (HOSPITAL_COMMUNITY)
Admission: EM | Admit: 2022-09-11 | Discharge: 2022-09-11 | Disposition: A | Discharge status: Home / Self Care | Payer: Medicaid Other | Attending: Emergency Medicine | Admitting: Emergency Medicine

## 2022-09-11 ENCOUNTER — Other Ambulatory Visit: Payer: Self-pay

## 2022-09-11 ENCOUNTER — Emergency Department (HOSPITAL_COMMUNITY): Payer: Medicaid Other

## 2022-09-11 DIAGNOSIS — M545 Low back pain, unspecified: Secondary | ICD-10-CM | POA: Insufficient documentation

## 2022-09-11 DIAGNOSIS — R079 Chest pain, unspecified: Secondary | ICD-10-CM | POA: Diagnosis not present

## 2022-09-11 DIAGNOSIS — Z79899 Other long term (current) drug therapy: Secondary | ICD-10-CM | POA: Insufficient documentation

## 2022-09-11 DIAGNOSIS — J45909 Unspecified asthma, uncomplicated: Secondary | ICD-10-CM | POA: Diagnosis not present

## 2022-09-11 DIAGNOSIS — M549 Dorsalgia, unspecified: Secondary | ICD-10-CM

## 2022-09-11 DIAGNOSIS — R0602 Shortness of breath: Secondary | ICD-10-CM | POA: Diagnosis not present

## 2022-09-11 LAB — CBC WITH DIFFERENTIAL/PLATELET
Abs Immature Granulocytes: 0.02 10*3/uL (ref 0.00–0.07)
Basophils Absolute: 0 10*3/uL (ref 0.0–0.1)
Basophils Relative: 1 %
Eosinophils Absolute: 0.1 10*3/uL (ref 0.0–0.5)
Eosinophils Relative: 2 %
HCT: 37.8 % (ref 36.0–46.0)
Hemoglobin: 12.5 g/dL (ref 12.0–15.0)
Immature Granulocytes: 0 %
Lymphocytes Relative: 40 %
Lymphs Abs: 2 10*3/uL (ref 0.7–4.0)
MCH: 28.4 pg (ref 26.0–34.0)
MCHC: 33.1 g/dL (ref 30.0–36.0)
MCV: 85.9 fL (ref 80.0–100.0)
Monocytes Absolute: 0.3 10*3/uL (ref 0.1–1.0)
Monocytes Relative: 6 %
Neutro Abs: 2.7 10*3/uL (ref 1.7–7.7)
Neutrophils Relative %: 51 %
Platelets: 314 10*3/uL (ref 150–400)
RBC: 4.4 MIL/uL (ref 3.87–5.11)
RDW: 14.4 % (ref 11.5–15.5)
WBC: 5.1 10*3/uL (ref 4.0–10.5)
nRBC: 0 % (ref 0.0–0.2)

## 2022-09-11 LAB — BASIC METABOLIC PANEL
Anion gap: 6 (ref 5–15)
BUN: 10 mg/dL (ref 6–20)
CO2: 23 mmol/L (ref 22–32)
Calcium: 8.9 mg/dL (ref 8.9–10.3)
Chloride: 109 mmol/L (ref 98–111)
Creatinine, Ser: 0.95 mg/dL (ref 0.44–1.00)
GFR, Estimated: >60 mL/min (ref >60)
Glucose, Bld: 96 mg/dL (ref 70–99)
Potassium: 3.6 mmol/L (ref 3.5–5.1)
Sodium: 138 mmol/L (ref 135–145)

## 2022-09-11 MED ORDER — CYCLOBENZAPRINE HCL 10 MG PO TABS: 10.0000 mg | ORAL_TABLET | Freq: Two times a day (BID) | ORAL | 0 refills | Status: AC | PRN

## 2022-09-11 NOTE — Discharge Instructions (Addendum)
You have been seen today for your complaint of back pain and shortness of breath. Your lab work was reassuring and showed no abnormalities. Your imaging was reassuring and showed no abnormalities. Your discharge medications include flexeril. This is a muscle relaxer. You should only take this as needed and only take it at night until you know how it affects you. You should not drink, driver or operate heavy machinery while taking this medication.  You may also take up to 1000 mg of tylenol every 6 hours for pain. Your chart says to avoid ibuprofen due to stomach issues, but you may still take ibuprofen sparingly in between doses of tylenol for better pain control.  Home care instructions are as follows:  You should perform the exercises as listed in this packet.  Follow up with: your asthma doctor regarding your increasing shortness of breath if it does not improve with pain control and back exercises. Please seek immediate medical care if you develop any of the following symptoms: You have pain that is not relieved with rest or medicine. You have increasing pain going down into your legs or buttocks. Your pain does not improve after 2 weeks. You have pain at night. You lose weight without trying. You have a fever or chills. You develop nausea or vomiting. You develop abdominal pain. At this time there does not appear to be the presence of an emergent medical condition, however there is always the potential for conditions to change. Please read and follow the below instructions.  Do not take your medicine if  develop an itchy rash, swelling in your mouth or lips, or difficulty breathing; call 911 and seek immediate emergency medical attention if this occurs.  You may review your lab tests and imaging results in their entirety on your MyChart account.  Please discuss all results of fully with your primary care provider and other specialist at your follow-up visit.  Note: Portions of this text  may have been transcribed using voice recognition software. Every effort was made to ensure accuracy; however, inadvertent computerized transcription errors may still be present.

## 2022-09-11 NOTE — ED Triage Notes (Signed)
Pt. Stated, Im having back all over my back for 4 days. Denies any injury

## 2022-09-11 NOTE — ED Provider Notes (Signed)
[facility] EMERGENCY DEPARTMENT Provider Note   CSN: [id number] Arrival date & time: 09/11/22  0935     History  Chief Complaint  Patient presents with   Back Pain    Veronica [name] is a 31 y.o. female.  History of chronic cough, GERD anxiety, asthma, anemia who presents ED for evaluation of 3 days of back pain and increasing shortness of breath.  She reports no inciting injury.  Describes the pain as encompassing the entirety of her back.  States she gets sharp pains that last for a few seconds and then resolve with lingering aching pains. Pain is worse with twisting and forward flexion. Also states she was taken off her flovent in July because her breathing tests were normal and reports not breathing as well since then. States she developed more shortness of breath with the onset of her back pain. Shortness of breath is worse during her episodes of back pain and improved between episodes. She has been using her inhaler at home with mild relief. Has a chronic, intermittent dry cough. No recent changes, no new production. Denies fevers, chills, numbness, weakness, tingling, saddle paresthesia, urinary or fecal incontinence   Back Pain      Home Medications Prior to Admission medications   Medication Sig Start Date End Date Taking? Authorizing Provider  cyclobenzaprine (FLEXERIL) 10 MG tablet Take 1 tablet (10 mg total) by mouth 2 (two) times daily as needed for up to 5 days for muscle spasms. 09/11/22 09/16/22 Yes Lyndall Bellot, [place], [name]  albuterol (VENTOLIN HFA) 108 (90 Base) MCG/ACT inhaler INHALE 2 PUFFS BY MOUTH EVERY 4 HOURS AS NEEDED FOR WHEEZING OR SHORTNESS OF BREATH (COUGH). 08/29/22   Shary Key, DO  cetirizine (ZYRTEC) 10 MG tablet Take 10 mg by mouth daily.    [provider]  escitalopram (LEXAPRO) 10 MG tablet Take 1 tablet (10 mg total) by mouth daily. 02/05/22   Shary Key, DO  etonogestrel (NEXPLANON) 68 MG IMPL implant 1 each  by Subdermal route once.    [provider]  famotidine (PEPCID) 20 MG tablet Take 1 tablet by mouth once daily 08/07/22   Shary Key, DO  ferrous sulfate 325 (65 FE) MG tablet Take 1 tablet (325 mg total) by mouth every other day. 06/30/22   Shary Key, DO  fluticasone (FLONASE) 50 MCG/ACT nasal spray Place 2 sprays into both nostrils daily. 08/29/22   Shary Key, DO  fluticasone (FLOVENT HFA) 110 MCG/ACT inhaler Inhale 2 puffs into the lungs 2 (two) times daily. 12/20/21   Zola Button, MD  hydrOXYzine (ATARAX) 25 MG tablet Take 1 tablet (25 mg total) by mouth 3 (three) times daily as needed for anxiety. 01/30/22   Briant Cedar, MD  metroNIDAZOLE (FLAGYL) 500 MG tablet Take 1 tablet (500 mg total) by mouth 2 (two) times daily. 08/12/22   Lamptey, Myrene Galas, MD  Multiple Vitamins-Minerals (ONE-A-DAY WOMENS PO) Take 1 tablet by mouth daily.    [provider]  polyethylene glycol (MIRALAX / GLYCOLAX) 17 g packet Take 17 g by mouth daily. 06/01/21   Gavin Pound, CNM      Allergies    Morphine and related and Motrin [ibuprofen]    Review of Systems   Review of Systems  Respiratory:  Positive for shortness of breath.   Musculoskeletal:  Positive for back pain.  All other systems reviewed and are negative.   Physical Exam Updated Vital Signs BP 103/74 (BP  Location: Right Arm)   Pulse 63   Temp 98.5 F (36.9 C) (Oral)   Resp 18   Ht 5\' 2"  (1.575 m)   Wt 112.9 kg   SpO2 99%   BMI 45.54 kg/m  Physical Exam Vitals and nursing note reviewed.  Constitutional:      General: She is not in acute distress.    Appearance: Normal appearance. She is obese. She is not ill-appearing or toxic-appearing.  HENT:     Head: Normocephalic and atraumatic.  Eyes:     Extraocular Movements: Extraocular movements intact.     Pupils: Pupils are equal, round, and reactive to light.  Cardiovascular:     Rate and Rhythm: Normal rate and regular rhythm.     Pulses:  Normal pulses.  Pulmonary:     Effort: Pulmonary effort is normal. No respiratory distress.     Breath sounds: Normal breath sounds. No stridor. No wheezing, rhonchi or rales.  Abdominal:     General: Abdomen is flat.  Musculoskeletal:        General: Normal range of motion.     Cervical back: Normal range of motion and neck supple. No bony tenderness.     Thoracic back: No bony tenderness.     Lumbar back: No bony tenderness.     Right lower leg: No edema.     Left lower leg: No edema.     Comments: Ambulatory without difficulty  Skin:    General: Skin is warm and dry.     Capillary Refill: Capillary refill takes less than 2 seconds.  Neurological:     General: No focal deficit present.     Mental Status: She is alert and oriented to person, place, and time.     Comments: Sensation intact, strength 5/5 in all extremities  Psychiatric:        Mood and Affect: Mood normal.        Behavior: Behavior normal.     ED Results / Procedures / Treatments   Labs (all labs ordered are listed, but only abnormal results are displayed) Labs Reviewed  BASIC METABOLIC PANEL  CBC WITH DIFFERENTIAL/PLATELET    EKG None  Radiology DG Chest 2 View  Result Date: 09/11/2022 CLINICAL DATA:  Shortness of breath with back pain for 4 days. History of asthma. EXAM: CHEST - 2 VIEW COMPARISON:  Radiographs 03/13/2022 and 03/10/2022.  CT 05/05/2019. FINDINGS: The heart size and mediastinal contours are normal. The lungs are clear. There is no pleural effusion or pneumothorax. No acute osseous findings are identified. IMPRESSION: No active cardiopulmonary disease. Electronically Signed   By: Richardean Sale M.D.   On: 09/11/2022 10:52    Procedures Procedures    Medications Ordered in ED Medications - No data to display  ED Course/ Medical Decision Making/ A&P                           Medical Decision Making Amount and/or Complexity of Data Reviewed Labs: ordered. Radiology:  ordered.  Risk Prescription drug management.  This patient presents to the ED for concern of back pain and shortness of breath, this involves an extensive number of treatment options, and is a complaint that carries with it a high risk of complications and morbidity.   The emergent differential diagnosis for back pain includes but is not limited to fracture, muscle strain, cauda equina, spinal stenosis. DDD, ankylosing spondylitis, acute ligamentous injury, disk herniation, spondylolisthesis, Epidural compression syndrome,  metastatic cancer, transverse myelitis, vertebral osteomyelitis, diskitis, kidney stone, pyelonephritis, AAA, Perforated ulcer, Retrocecal appendicitis, pancreatitis, bowel obstruction, retroperitoneal hemorrhage or mass, meningitis.   The emergent differential diagnosis for shortness of breath includes, but is not limited to, Pulmonary edema, bronchoconstriction, Pneumonia, Pulmonary embolism, Pneumotherax/ Hemothorax, Dysrythmia, ACS.  Patient is stable and I have low suspicion for emergent causes of shortness of breath.   Co morbidities that complicate the patient evaluation  Asthma, anxiety  My initial workup includes chest x ray and basic labs  Additional history obtained from: Nursing notes from this visit. Previous records within EMR system Pulmonology visit on 06/17/2022 for evaluation of asthma, flovent was stopped  I ordered, reviewed and interpreted labs which include: cbc, bmp. All labs within normal limits   I ordered imaging studies including chest x ray I independently visualized and interpreted imaging which showed no cardiopulmonary abnormalities I agree with the radiologist interpretation  Afebrile, hemodynamically stable.  Patient is a 31 year old female presents ED for evaluation of 3 days of spasmodic back pain and increase in her baseline shortness of breath.  Patient reported no inciting injury or red flag symptoms. Pain is reproducible with  twisting or forward flexion. I do not believe imaging of the spine is necessary. History and physical exam correlate most closely with back muscle spasms. I will send a prescription for flexeril. I instructed patient not to drive, operate heavy machinery, or work while taking this medication. Educated patient on appropriate dosing of tylenol and to take ibuprofen sparingly as it has caused gastritis in the past. Did obtain CXR due to SOB which was normal. Basic labs all resulted as normal. I have low suspicion of emergent diagnoses for her SOB due to vital signs and nature of SOB (long-standing, worse with back pain). I have instructed patient to follow up with her pulmonologist if symptoms do not improve with pain control. Gave strict return precautions. Stable at discharge.   At this time there does not appear to be any evidence of an acute emergency medical condition and the patient appears stable for discharge with appropriate outpatient follow up. Diagnosis was discussed with patient who verbalizes understanding of care plan and is agreeable to discharge. I have discussed return precautions with patient who verbalizes understanding. Patient encouraged to follow-up with their PCP within 1 week. All questions answered.  Note: Portions of this report may have been transcribed using voice recognition software. Every effort was made to ensure accuracy; however, inadvertent computerized transcription errors may still be present.          Final Clinical Impression(s) / ED Diagnoses Final diagnoses:  Acute bilateral back pain, unspecified back location    Rx / DC Orders ED Discharge Orders          Ordered    cyclobenzaprine (FLEXERIL) 10 MG tablet  2 times daily PRN        09/11/22 1429              Roylene Reason, [name] 09/11/22 [place], DO 09/12/22 [phone]

## 2022-09-11 NOTE — ED Provider Triage Note (Signed)
Emergency Medicine Provider Triage Evaluation Note  Veronica [name] , a 31 y.o. female  was evaluated in triage.  Pt complains of stress with back pains and shortness of breath for the past 4 days.  She has a history of asthma, has been using her inhaler however states he does not improve her shortness of breath.  Back pain involves the entirety of her back, and she describes the pain as sharp and shooting and lasting for seconds at a time before completely resolving.  This makes her shortness of breath worse.  Pain completely resolves after each episode and shortness of breath improves.  No inciting injury.  Episodes do occur more frequently with twisting or bending of the spine.  Review of Systems  Positive: As above Negative: Fevers, chills, numbness, weakness, tingling, IV drug use, fecal or urinary incontinence  Physical Exam  BP 122/76 (BP Location: Right Arm)   Pulse 86   Temp 98.7 F (37.1 C) (Oral)   Resp 16   SpO2 96%  Gen:   Awake, no distress   Resp:  Normal effort  MSK:   Moves extremities without difficulty no TTP to the C, T, L-spine Other:    Medical Decision Making  Medically screening exam initiated at 10:15 AM.  Appropriate orders placed.  Veronica [name] was informed that the remainder of the evaluation will be completed by another provider, this initial triage assessment does not replace that evaluation, and the importance of remaining in the ED until their evaluation is complete.  Patient requesting EKG.  We will also order chest x-ray and basic labs.  Has a history of anemia   Roylene Reason, [place] 09/11/22 1018

## 2022-09-16 ENCOUNTER — Other Ambulatory Visit: Payer: Self-pay | Admitting: Family Medicine

## 2022-10-04 DIAGNOSIS — L03114 Cellulitis of left upper limb: Secondary | ICD-10-CM | POA: Diagnosis not present

## 2022-10-13 ENCOUNTER — Ambulatory Visit (INDEPENDENT_AMBULATORY_CARE_PROVIDER_SITE_OTHER): Payer: Medicaid Other | Admitting: Family Medicine

## 2022-10-13 ENCOUNTER — Other Ambulatory Visit (HOSPITAL_COMMUNITY)
Admission: RE | Admit: 2022-10-13 | Discharge: 2022-10-13 | Disposition: A | Discharge status: Home / Self Care | Payer: Self-pay | Source: Ambulatory Visit | Attending: Family Medicine | Admitting: Family Medicine

## 2022-10-13 VITALS — BP 131/69 | HR 75 | Wt 250.4 lb

## 2022-10-13 DIAGNOSIS — N898 Other specified noninflammatory disorders of vagina: Secondary | ICD-10-CM | POA: Insufficient documentation

## 2022-10-13 DIAGNOSIS — D649 Anemia, unspecified: Secondary | ICD-10-CM

## 2022-10-13 DIAGNOSIS — B3731 Acute candidiasis of vulva and vagina: Secondary | ICD-10-CM

## 2022-10-13 LAB — POCT WET PREP (WET MOUNT)
Clue Cells Wet Prep Whiff POC: NEGATIVE
Trichomonas Wet Prep HPF POC: ABSENT

## 2022-10-13 MED ORDER — ESCITALOPRAM OXALATE 10 MG PO TABS: 10.0000 mg | ORAL_TABLET | Freq: Every day | ORAL | 0 refills | Status: DC

## 2022-10-13 MED ORDER — FERROUS SULFATE 325 (65 FE) MG PO TABS: 325.0000 mg | ORAL_TABLET | ORAL | 0 refills | Status: DC

## 2022-10-13 MED ORDER — FLUCONAZOLE 150 MG PO TABS: 150.0000 mg | ORAL_TABLET | Freq: Once | ORAL | 0 refills | Status: AC

## 2022-10-13 NOTE — Patient Instructions (Addendum)
It was nice seeing you today!  I will update you with your results when available.  Think about starting PrEP which is a daily medication to help prevent HIV. Schedule an appointment to discuss further if you are interested in starting. Information included below.  Stay well, Zola Button, MD [place] [phone]  --  Make sure to check out at the front desk before you leave today.  Please arrive at least 15 minutes prior to your scheduled appointments.  If you had blood work today, I will send you a MyChart message or a letter if results are normal. Otherwise, I will give you a call.  If you had a referral placed, they will call you to set up an appointment. Please give [place] a call if you don't hear back in the next 2 weeks.  If you need additional refills before your next appointment, please call your pharmacy first.   We talked about measures of prevention today. PREP (pre-exposure prophylaxis) is a daily medication to prevent HIV (Human Immunodeficiency Virus).   Below is some information about the medication--most commonly called Truvada. This is a once daily pill.

## 2022-10-13 NOTE — Progress Notes (Signed)
    SUBJECTIVE:   CHIEF COMPLAINT / HPI:  Chief Complaint  Patient presents with   Vaginal Itching    Reports vaginal itching and burning that started 3 days ago. She has had some dark red vaginal discharge which she thinks is due to her period (she is coming off her period). Last sexual intercourse was about 1 week ago, unprotected with a new partner. Denies fever, chills. Would like to be tested for STIs.  Also needs refills of iron supplement and escitalopram. She reports she is doing well on those medications.  PERTINENT  PMH / PSH: trichomonas infection (negative on recent retesting)  Patient Care Team: Shary Key, DO as PCP - General (Family Medicine) Jerline Pain, MD as PCP - Cardiology (Cardiology)   OBJECTIVE:   BP 131/69   Pulse 75   Wt 250 lb 6.4 oz (113.6 kg)   SpO2 96%   BMI 45.80 kg/m   Physical Exam Exam conducted with a chaperone present.  Constitutional:      General: She is not in acute distress. Cardiovascular:     Rate and Rhythm: Normal rate.  Pulmonary:     Effort: Pulmonary effort is normal. No respiratory distress.  Genitourinary:    General: Normal vulva.     Comments: Small amount of dark red vaginal discharge / clotted blood Musculoskeletal:     Cervical back: Neck supple.  Neurological:     Mental Status: She is alert.         08/01/2022    4:10 PM  Depression screen PHQ 2/9  Decreased Interest 0  Down, Depressed, Hopeless 0  PHQ - 2 Score 0  Altered sleeping 0  Tired, decreased energy 2  Change in appetite 1  Feeling bad or failure about yourself  0  Trouble concentrating 0  Moving slowly or fidgety/restless 0  Suicidal thoughts 0  PHQ-9 Score 3  Difficult doing work/chores Not difficult at all     {Show previous vital signs (optional):23777}    ASSESSMENT/PLAN:   Vaginal candidiasis Obtaining STI testing given recent unprotected intercourse with new partner. - wet mount consistent with yeast infection -  fluconazole - GC/chlamydia - check HIV and RPR  - brief counseling and provided information on PrEP  Refill for escitalopram and iron supplement provided  Return if symptoms worsen or fail to improve.   Zola Button, MD [place]

## 2022-10-14 LAB — RPR: RPR Ser Ql: NONREACTIVE

## 2022-10-14 LAB — HIV ANTIBODY (ROUTINE TESTING W REFLEX): HIV Screen 4th Generation wRfx: NONREACTIVE

## 2022-10-15 LAB — CERVICOVAGINAL ANCILLARY ONLY
Chlamydia: NEGATIVE
Comment: NEGATIVE
Comment: NORMAL
Neisseria Gonorrhea: NEGATIVE

## 2022-10-23 ENCOUNTER — Other Ambulatory Visit: Payer: Self-pay | Admitting: Family Medicine

## 2022-11-06 ENCOUNTER — Other Ambulatory Visit (HOSPITAL_COMMUNITY)
Admission: RE | Admit: 2022-11-06 | Discharge: 2022-11-06 | Disposition: A | Discharge status: Home / Self Care | Payer: Self-pay | Source: Ambulatory Visit | Attending: Family Medicine | Admitting: Family Medicine

## 2022-11-06 ENCOUNTER — Ambulatory Visit (INDEPENDENT_AMBULATORY_CARE_PROVIDER_SITE_OTHER): Payer: Self-pay | Admitting: Family Medicine

## 2022-11-06 ENCOUNTER — Encounter: Payer: Self-pay | Admitting: Family Medicine

## 2022-11-06 VITALS — BP 122/72 | HR 82 | Ht 62.0 in | Wt 259.2 lb

## 2022-11-06 DIAGNOSIS — N898 Other specified noninflammatory disorders of vagina: Secondary | ICD-10-CM | POA: Insufficient documentation

## 2022-11-06 DIAGNOSIS — R300 Dysuria: Secondary | ICD-10-CM

## 2022-11-06 DIAGNOSIS — R350 Frequency of micturition: Secondary | ICD-10-CM

## 2022-11-06 LAB — POCT WET PREP (WET MOUNT)
Clue Cells Wet Prep Whiff POC: NEGATIVE
Trichomonas Wet Prep HPF POC: ABSENT

## 2022-11-06 LAB — POCT URINALYSIS DIP (MANUAL ENTRY)
Bilirubin, UA: NEGATIVE
Glucose, UA: NEGATIVE mg/dL
Ketones, POC UA: NEGATIVE mg/dL
Nitrite, UA: NEGATIVE
Protein Ur, POC: NEGATIVE mg/dL
Spec Grav, UA: 1.01 (ref 1.010–1.025)
Urobilinogen, UA: 0.2 E.U./dL
pH, UA: 7 (ref 5.0–8.0)

## 2022-11-06 LAB — POCT GLYCOSYLATED HEMOGLOBIN (HGB A1C): Hemoglobin A1C: 5.2 % (ref 4.0–5.6)

## 2022-11-06 LAB — POCT URINE PREGNANCY: Preg Test, Ur: NEGATIVE

## 2022-11-06 MED ORDER — FLUCONAZOLE 150 MG PO TABS: 150.0000 mg | ORAL_TABLET | Freq: Once | ORAL | 0 refills | Status: AC

## 2022-11-06 MED ORDER — CEPHALEXIN 500 MG PO CAPS: 500.0000 mg | ORAL_CAPSULE | Freq: Three times a day (TID) | ORAL | 0 refills | Status: AC

## 2022-11-06 NOTE — Patient Instructions (Addendum)
It was wonderful to see you today.  Please bring ALL of your medications with you to every visit.   Today we talked about:  I will give you a call if your results from today are abnormal.  If they are normal, I will send you a message on MyChart.    Thank you for coming to your visit as scheduled. We have had a large "no-show" problem lately, and this significantly limits our ability to see and care for patients. As a friendly reminder- if you cannot make your appointment please call to cancel. We do have a no show policy for those who do not cancel within 24 hours. Our policy is that if you miss or fail to cancel an appointment within 24 hours, 3 times in a 71-month period, you may be dismissed from our clinic.   Thank you for choosing [facility].   Please call [phone] with any questions about today's appointment.  Please be sure to schedule follow up at the front  desk before you leave today.   Vonna Drafts, MD PGY-1 Family Medicine

## 2022-11-06 NOTE — Progress Notes (Signed)
SUBJECTIVE:   CHIEF COMPLAINT / HPI:   Veronica [name] is a 31 y.o. female who presents to the [facility] clinic today to discuss the following concerns:   Itching, Burning in vaginal area x3 days -Desires G/C and trichomonas testing -No fevers, significant discharge -Endorses dysuria. Denies hematuria -Sexually active, most recently last week -LMP in October. Nexplanon in place -Desires pregnancy test today  Urinary frequency x2 weeks and weight gain x several months -Pt desires A1c check today -Has been little bit more stressed out than usual since April.  Reports that she has gained 20 to 30 pounds since then -Has been experiencing increased urinary frequency for the past 2 weeks.  Endorses dysuria x 3 days as above. -Currently not on any medications for diabetes or prediabetes -Last Hgb A1c in 07/2022 was 5.3. She has no previous hx of diabetes but did have prediabetes in 05/2019.    PERTINENT  PMH / PSH: Trichomonas   OBJECTIVE:   BP 122/72   Pulse 82   Ht 5\' 2"  (1.575 m)   Wt 259 lb 3.2 oz (117.6 kg)   SpO2 100%   BMI 47.41 kg/m    General: NAD, pleasant, able to participate in exam Abd: Soft, NT/ND Pelvic: Speculum exam completed.  White discharge noted around cervical os and in vaginal canal.  No significant erythema or irritation noted to the vaginal canal.  Patient tolerated the procedure well.  Bimanual exam not completed.  ASSESSMENT/PLAN:   Vaginal irritation Assessment & Plan: Ongoing x 3 days.  Patient denies vaginal discharge but significant white discharge was noted on speculum exam.  Will obtain wet prep and G/C.  Will also obtain urinalysis due to dysuria and urine pregnancy test per patient request.  Wet prep positive for yeast.  Negative for BV and trichomonas.  Will treat with Diflucan  Orders: -     POCT urine pregnancy -     Cervicovaginal ancillary only -     POCT Wet Prep (Wet Mount) -     Fluconazole; Take 1 tablet (150 mg total) by mouth once  for 1 dose. Take 1 tablet now. You can take another tablet 24hrs after completing your antibiotic (Keflex/cephalexin) course if symptoms do not resolve.  Dispense: 2 tablet; Refill: 0  Dysuria Assessment & Plan: Ongoing x 3 days.  Denies hematuria, fevers.  No abdominal or back pain.  Obtaining UA as above.  UA showing 2+ leukocytes and blood.  Sample inadequate for microscopic analysis.  Will treat with Keflex 3 times daily x 3 days for uncomplicated UTI.  Will advise patient to take additional dose of Diflucan after completing this course of antibiotics to prevent/treat recurrence of yeast infection.  Orders: -     POCT urinalysis dipstick -     POCT urine pregnancy -     Urine Microscopic -     Cephalexin; Take 1 capsule (500 mg total) by mouth 3 (three) times daily for 3 days.  Dispense: 9 capsule; Refill: 0  Urinary frequency Assessment & Plan: Ongoing x 2 weeks.  Patient is also experienced some weight gain due to increased stress over the past several months.  Patient is concerned that her blood sugar may be rising due to this.  Last A1c was under prediabetic range.  Will recheck today - counseled on appropriate diet and exercise  Orders: -     POCT glycosylated hemoglobin (Hb A1C)     F/u as needed  , MD [facility]

## 2022-11-06 NOTE — Assessment & Plan Note (Signed)
Ongoing x 2 weeks.  Patient is also experienced some weight gain due to increased stress over the past several months.  Patient is concerned that her blood sugar may be rising due to this.  Last A1c was under prediabetic range.  Will recheck today - counseled on appropriate diet and exercise

## 2022-11-06 NOTE — Assessment & Plan Note (Addendum)
Ongoing x 3 days.  Denies hematuria, fevers.  No abdominal or back pain.  Obtaining UA as above.  UA showing 2+ leukocytes and blood.  Sample inadequate for microscopic analysis.  Will treat with Keflex 3 times daily x 3 days for uncomplicated UTI.  Will advise patient to take additional dose of Diflucan after completing this course of antibiotics to prevent/treat recurrence of yeast infection.

## 2022-11-06 NOTE — Assessment & Plan Note (Addendum)
Ongoing x 3 days.  Patient denies vaginal discharge but significant white discharge was noted on speculum exam.  Will obtain wet prep and G/C.  Will also obtain urinalysis due to dysuria and urine pregnancy test per patient request.  Wet prep positive for yeast.  Negative for BV and trichomonas.  Will treat with Diflucan

## 2022-11-07 LAB — URINALYSIS, MICROSCOPIC ONLY
Bacteria, UA: NONE SEEN
Casts: NONE SEEN /lpf
RBC, Urine: NONE SEEN /hpf (ref 0–2)

## 2022-11-10 ENCOUNTER — Telehealth: Payer: Self-pay | Admitting: Family Medicine

## 2022-11-10 DIAGNOSIS — A549 Gonococcal infection, unspecified: Secondary | ICD-10-CM

## 2022-11-10 LAB — CERVICOVAGINAL ANCILLARY ONLY
Chlamydia: NEGATIVE
Comment: NEGATIVE
Comment: NEGATIVE
Comment: NORMAL
Neisseria Gonorrhea: POSITIVE — AB
Trichomonas: NEGATIVE

## 2022-11-10 MED ORDER — CEFTRIAXONE SODIUM 500 MG IJ SOLR: 500.0000 mg | Freq: Once | INTRAMUSCULAR | Status: DC

## 2022-11-10 NOTE — Telephone Encounter (Signed)
Called pt to discuss positive gonorrhea result. Pt agrees to come in for a nurse visit tomorrow AM at 9am to receive 1 time dose of CTX. Pt agrees to discuss w/ her partner to have him schedule an appointment with his PCP for evaluation/treatment. No further concerns  Vonna Drafts, MD

## 2022-11-11 ENCOUNTER — Ambulatory Visit (INDEPENDENT_AMBULATORY_CARE_PROVIDER_SITE_OTHER): Payer: Self-pay

## 2022-11-11 DIAGNOSIS — A549 Gonococcal infection, unspecified: Secondary | ICD-10-CM

## 2022-11-12 MED ORDER — CEFTRIAXONE SODIUM 250 MG IJ SOLR
250.0000 mg | Freq: Once | INTRAMUSCULAR | Status: AC
  Administered 2022-11-11: 250 mg via INTRAMUSCULAR

## 2022-11-12 NOTE — Progress Notes (Signed)
Patient in nurse clinic today for STD treatment of gonorrhea.    Patient advised to abstain from sex for 7-10 days after treatment of self and partner.    Ceftriaxone 250 mg administered in RUOQ and 250 mg administered in LUOQ. Patient tolerated injections well, sites unremarkable. Observed in clinic 15 minutes after injection. No signs of adverse reaction.   Patient to follow up in 2-3 months for re-screening.    STD report form fax completed and faxed to [facility] at [phone] (STD department).     Veronda Prude, RN

## 2022-12-13 ENCOUNTER — Other Ambulatory Visit: Payer: Self-pay | Admitting: Family Medicine

## 2022-12-15 ENCOUNTER — Encounter: Payer: Self-pay | Admitting: Student

## 2022-12-15 ENCOUNTER — Ambulatory Visit (INDEPENDENT_AMBULATORY_CARE_PROVIDER_SITE_OTHER): Payer: Self-pay | Admitting: Student

## 2022-12-15 ENCOUNTER — Ambulatory Visit (HOSPITAL_COMMUNITY): Payer: Self-pay

## 2022-12-15 ENCOUNTER — Other Ambulatory Visit (HOSPITAL_COMMUNITY)
Admission: RE | Admit: 2022-12-15 | Discharge: 2022-12-15 | Disposition: A | Discharge status: Home / Self Care | Payer: Self-pay | Source: Ambulatory Visit | Attending: Family Medicine | Admitting: Family Medicine

## 2022-12-15 VITALS — BP 107/71 | HR 62 | Ht 62.0 in | Wt 260.2 lb

## 2022-12-15 DIAGNOSIS — N898 Other specified noninflammatory disorders of vagina: Secondary | ICD-10-CM

## 2022-12-15 DIAGNOSIS — R002 Palpitations: Secondary | ICD-10-CM

## 2022-12-15 LAB — POCT WET PREP (WET MOUNT)
Clue Cells Wet Prep Whiff POC: NEGATIVE
Trichomonas Wet Prep HPF POC: ABSENT

## 2022-12-15 NOTE — Progress Notes (Signed)
    SUBJECTIVE:   CHIEF COMPLAINT / HPI:   32 y.o.  year old female presents with complaint of irritation and burning sensation that started yesterday.  She also endorses increased frequency but not urgency.  She is sexually active with one partner and no protection. Endorses mild vaginal discharge, itchiness but no odor.  No hematuria or recent change in medication. LMP was 2 months ago, she's on Nexplanon   Palpitations Patient report intermittent palpations in the last week  Denies any chest pain, vision changes or loss of consciousness These episodes are spontaneous and short lived No heat or cold intolerance intolerance She has history of anxiety on Lexapro   PERTINENT  PMH / PSH: Reviewed  OBJECTIVE:   BP 107/71   Pulse 62   Ht 5\' 2"  (1.575 m)   Wt 260 lb 3.2 oz (118 kg)   SpO2 100%   BMI 47.59 kg/m    Physical Exam General: Alert, well appearing, NAD Cardiovascular: RRR, No Murmurs, Normal S2/S2 Respiratory: CTAB, No wheezing or Rales Abdomen: No distension or tenderness Extremities: No edema on extremities   Genitalia:  Normal introitus for age, no external lesions, white vaginal discharge, mucosa pink and moist, no vaginal or cervical lesions, no vaginal atrophy, no friaility or hemorrhage, normal uterus size and position.  CMA [employer] served as Producer, television/film/video for the exam.    ASSESSMENT/PLAN:   STD screening Patient with complain of general irritation with burning sensation, and vaginal discharge and odor.Her exam showed mild discharge at the cervical ox.  -Follow up with lab for GC, Chlamydia, Trichomonas.  Palpitations His history and exam findings are likely secondary to anxiety. EKG showed NSR. -Discussed considering increasing her lexapro if symptoms persist -Advised patient she would benefit from behavioral therapy.  She currently is without insurance, recommend she reaches out to the [facility] to help with finsing  her a therapist.  -Reviewed return precautions, which patient verbalized understanding and amenable to plan.        Alen Bleacher, MD [place]

## 2022-12-15 NOTE — Patient Instructions (Signed)
It was wonderful to meet you today. Thank you for allowing me to be a part of your care. Below is a short summary of what we discussed at your visit today:  Collected samples today for STD check, we are testing for gonorrhea, chlamydia, trichomonas and candida.  In terms of your occasional flutters your EKG today was normal.  I suspect this could be related to your anxiety.  We can go up on your Lexapro and I believe you will also benefit from seeing a therapist as well.  Please bring all of your medications to every appointment!  If you have any questions or concerns, please do not hesitate to contact us via phone or MyChart message.   Alen Bleacher, MD [facility]

## 2022-12-17 ENCOUNTER — Other Ambulatory Visit: Payer: Self-pay | Admitting: Student

## 2022-12-17 DIAGNOSIS — A599 Trichomoniasis, unspecified: Secondary | ICD-10-CM

## 2022-12-17 LAB — CERVICOVAGINAL ANCILLARY ONLY
Chlamydia: NEGATIVE
Comment: NEGATIVE
Comment: NEGATIVE
Comment: NORMAL
Neisseria Gonorrhea: NEGATIVE
Trichomonas: POSITIVE — AB

## 2022-12-17 MED ORDER — METRONIDAZOLE 500 MG PO TABS: 500.0000 mg | ORAL_TABLET | Freq: Two times a day (BID) | ORAL | 0 refills | Status: AC

## 2022-12-17 NOTE — Progress Notes (Signed)
Called patient and verified her.  Informed patient she tested positive for trichomonas and I will be sending in prescription for metronidazole 500 mg  BID for 7 days. Patient verbalized understanding and agreeable to plan

## 2022-12-26 ENCOUNTER — Other Ambulatory Visit: Payer: Self-pay

## 2022-12-26 DIAGNOSIS — D649 Anemia, unspecified: Secondary | ICD-10-CM

## 2022-12-28 MED ORDER — FERROUS SULFATE 325 (65 FE) MG PO TABS
325.0000 mg | ORAL_TABLET | ORAL | 0 refills | Status: DC
Start: 2022-12-28 — End: 2023-02-13

## 2023-01-12 ENCOUNTER — Encounter: Payer: Self-pay | Admitting: Family Medicine

## 2023-01-12 ENCOUNTER — Other Ambulatory Visit (HOSPITAL_COMMUNITY)
Admission: RE | Admit: 2023-01-12 | Discharge: 2023-01-12 | Disposition: A | Discharge status: Home / Self Care | Payer: Self-pay | Source: Ambulatory Visit | Attending: Family Medicine | Admitting: Family Medicine

## 2023-01-12 ENCOUNTER — Ambulatory Visit (INDEPENDENT_AMBULATORY_CARE_PROVIDER_SITE_OTHER): Payer: Self-pay | Admitting: Family Medicine

## 2023-01-12 VITALS — BP 112/68 | HR 68 | Ht 62.0 in | Wt 255.0 lb

## 2023-01-12 DIAGNOSIS — B9689 Other specified bacterial agents as the cause of diseases classified elsewhere: Secondary | ICD-10-CM

## 2023-01-12 DIAGNOSIS — N898 Other specified noninflammatory disorders of vagina: Secondary | ICD-10-CM | POA: Insufficient documentation

## 2023-01-12 DIAGNOSIS — B3731 Acute candidiasis of vulva and vagina: Secondary | ICD-10-CM

## 2023-01-12 DIAGNOSIS — Z113 Encounter for screening for infections with a predominantly sexual mode of transmission: Secondary | ICD-10-CM

## 2023-01-12 DIAGNOSIS — K219 Gastro-esophageal reflux disease without esophagitis: Secondary | ICD-10-CM

## 2023-01-12 LAB — POCT WET PREP (WET MOUNT)
Clue Cells Wet Prep Whiff POC: NEGATIVE
Trichomonas Wet Prep HPF POC: ABSENT

## 2023-01-12 LAB — POCT UA - MICROSCOPIC ONLY: RBC, Urine, Miroscopic: NONE SEEN (ref 0–2)

## 2023-01-12 LAB — POCT URINALYSIS DIP (MANUAL ENTRY)
Bilirubin, UA: NEGATIVE
Blood, UA: NEGATIVE
Glucose, UA: NEGATIVE mg/dL
Ketones, POC UA: NEGATIVE mg/dL
Nitrite, UA: NEGATIVE
Spec Grav, UA: 1.025 (ref 1.010–1.025)
Urobilinogen, UA: 0.2 E.U./dL
pH, UA: 6 (ref 5.0–8.0)

## 2023-01-12 MED ORDER — FLUCONAZOLE 150 MG PO TABS: 150.0000 mg | ORAL_TABLET | Freq: Once | ORAL | 0 refills | Status: AC

## 2023-01-12 MED ORDER — METRONIDAZOLE 500 MG PO TABS: 500.0000 mg | ORAL_TABLET | Freq: Two times a day (BID) | ORAL | 0 refills | Status: DC

## 2023-01-12 NOTE — Patient Instructions (Addendum)
It was great to see you today! Here's what we talked about:  You have a yeast infection and bacterial vaginosis. I have sent in diflucan to treat the yeast and metronidazole to treat the BV. Let me know if you continue to have symptoms. Stop taking famotidine for now and start the omeprazole 20 mg daily. This should help your reflux.   Please let me know if you have any other questions.  Dr. Marcha Dutton

## 2023-01-13 ENCOUNTER — Telehealth: Payer: Self-pay | Admitting: Family Medicine

## 2023-01-13 LAB — CERVICOVAGINAL ANCILLARY ONLY
Chlamydia: NEGATIVE
Comment: NEGATIVE
Comment: NORMAL
Neisseria Gonorrhea: NEGATIVE

## 2023-01-13 NOTE — Assessment & Plan Note (Signed)
Advised patient to trial omeprazole 20 mg over-the-counter for her acid reflux and stop Pepcid. Doubt any alarming etiology given weight has been stable and no evidence of anemia on CBC in October of last year. If her symptoms continue, recommend returning for further evaluation.

## 2023-01-13 NOTE — Assessment & Plan Note (Signed)
Patient has had multiple visits over the last couple of weeks for vaginal irritation.  She has been treated for trichomonas.  On exam, she appears to have a yeast infection, and wet prep did return positive.  Will prescribe Diflucan x 1 dose.  GC/chlamydia still pending.  Will also obtain RPR and HIV antibody today.  Will follow-up results.

## 2023-01-13 NOTE — Progress Notes (Cosign Needed Addendum)
    SUBJECTIVE:   CHIEF COMPLAINT / HPI:   Vaginal irritation Presents with vaginal itching and burning for the last couple of weeks.  Was recently seen at the beginning of this month and tested positive for trichomonas for which she was treated with metronidazole for 7 days.  She notices the symptoms mostly whenever she is washing the outside of the vagina.  She denies any burning with urination.  She recently had sexual intercourse around 1 month ago.  She has not noticed any abnormal discharge.  She has not noticed any lesions.  She would like to get tested for any STIs.  GERD Patient has been taking famotidine daily.  The medication used to help her GERD, though her GERD has now gotten worse while on this medication.  She would like to try another medication at this time.  OBJECTIVE:   BP 112/68   Pulse 68   Ht 5\' 2"  (1.575 m)   Wt 255 lb (115.7 kg)   SpO2 100%   Breastfeeding No   BMI 46.64 kg/m   General: Alert and oriented, in NAD Skin: Warm, dry HEENT: NCAT, EOM grossly normal, midline nasal septum Cardiac: Regular rate Respiratory: Breathing and speaking comfortably on RA Extremities: Moves all extremities grossly equally GU: External vagina normal, internal vaginal canal without erythema or lesions, copious thick white discharge surrounding cervical os, cervix without lesions or bleeding Neurological: No gross focal deficit Psychiatric: Appropriate mood and affect  ASSESSMENT/PLAN:   Vaginal irritation Patient has had multiple visits over the last couple of weeks for vaginal irritation.  She has been treated for trichomonas.  On exam, she appears to have a yeast infection, and wet prep did return positive.  Will prescribe Diflucan x 1 dose.  GC/chlamydia still pending.  Will also obtain RPR and HIV antibody today.  Will follow-up results.  GERD (gastroesophageal reflux disease) Advised patient to trial omeprazole 20 mg over-the-counter for her acid reflux and stop  Pepcid. Doubt any alarming etiology given weight has been stable and no evidence of anemia on CBC in October of last year. If her symptoms continue, recommend returning for further evaluation.  Ethelene Hal, MD [place]

## 2023-01-13 NOTE — Telephone Encounter (Signed)
Called patient to discuss STI results.  She has taken Diflucan for her yeast infection.  I advised her to no longer take the metronidazole, as I erroneously read her test results wrong for BV.  We discussed her negative HIV antibody as well as her positive RPR with a titer of 1:1.  I let her know that we are still awaiting antibodies, and I will give her a call if those return positive and if IM penicillin was needed.  Patient was thankful, appreciative, and amenable to plan.

## 2023-01-14 LAB — RPR: RPR Ser Ql: REACTIVE — AB

## 2023-01-14 LAB — RPR, QUANT+TP ABS (REFLEX)
Rapid Plasma Reagin, Quant: 1:1 {titer} — ABNORMAL HIGH
T Pallidum Abs: NONREACTIVE

## 2023-01-14 LAB — HIV ANTIBODY (ROUTINE TESTING W REFLEX): HIV Screen 4th Generation wRfx: NONREACTIVE

## 2023-01-15 ENCOUNTER — Encounter: Payer: Self-pay | Admitting: Family Medicine

## 2023-01-15 MED ORDER — FLUCONAZOLE 150 MG PO TABS: 150.0000 mg | ORAL_TABLET | Freq: Once | ORAL | 0 refills | Status: AC

## 2023-01-15 NOTE — Progress Notes (Addendum)
Patient reported continued vaginal itching yesterday (01/14/2023) on our phone call to discuss false-positive RPR results. Sent patient MyChart message stating I would send in two more doses of diflucan 150 mg to be taken now (72 hours since first dose) and to be taken in another 72 hours should her symptoms continue. Advised patient to follow up with me should her symptoms remain uncontrolled.

## 2023-01-15 NOTE — Addendum Note (Signed)
Addended by: Jacelyn Grip B on: 01/15/2023 02:54 PM   Modules accepted: Orders

## 2023-01-30 ENCOUNTER — Encounter (HOSPITAL_COMMUNITY): Payer: Self-pay

## 2023-01-30 ENCOUNTER — Ambulatory Visit (HOSPITAL_COMMUNITY)
Admission: EM | Admit: 2023-01-30 | Discharge: 2023-01-30 | Disposition: A | Discharge status: Home / Self Care | Payer: Medicaid Other | Attending: Physician Assistant | Admitting: Physician Assistant

## 2023-01-30 DIAGNOSIS — Z8616 Personal history of COVID-19: Secondary | ICD-10-CM | POA: Diagnosis not present

## 2023-01-30 DIAGNOSIS — J4541 Moderate persistent asthma with (acute) exacerbation: Secondary | ICD-10-CM | POA: Diagnosis not present

## 2023-01-30 DIAGNOSIS — U071 COVID-19: Secondary | ICD-10-CM | POA: Diagnosis not present

## 2023-01-30 DIAGNOSIS — J069 Acute upper respiratory infection, unspecified: Secondary | ICD-10-CM

## 2023-01-30 MED ORDER — ALBUTEROL SULFATE HFA 108 (90 BASE) MCG/ACT IN AERS: INHALATION_SPRAY | RESPIRATORY_TRACT | 0 refills | Status: DC

## 2023-01-30 MED ORDER — PREDNISONE 10 MG (21) PO TBPK: ORAL_TABLET | ORAL | 0 refills | Status: DC

## 2023-01-30 MED ORDER — FLUTICASONE PROPIONATE HFA 110 MCG/ACT IN AERO: 2.0000 | INHALATION_SPRAY | Freq: Two times a day (BID) | RESPIRATORY_TRACT | 0 refills | Status: DC

## 2023-01-30 NOTE — Discharge Instructions (Addendum)
I believe you have a virus/allergies that flared your asthma.  We will contact you if you are positive for COVID.  Please start prednisone taper.  Do not take NSAIDs with this medication including aspirin, ibuprofen/Advil, naproxen/Aleve.  You can use acetaminophen/Tylenol as well as Mucinex and Flonase to help manage your symptoms.  I have called in a refill of your albuterol inhaler to have on hand.  I have also refilled her fluticasone.  Please continue this as previously prescribed.  Make sure to rinse your mouth following use of this medication to prevent thrush.  Follow-up with your primary care next week if symptoms have not resolved.  If you have any worsening symptoms including persistent cough/shortness of breath, wheezing, weakness, nausea, vomiting you need to be seen immediately.

## 2023-01-30 NOTE — ED Triage Notes (Signed)
Chief Complaint: eyes watering, nose stopped up, cough, chest congestion   Onset: 2 days   Prescriptions or OTC medications tried: Yes- cough drops     with no relief  Sick exposure: No  New foods, medications, or products: No  Recent Travel: No

## 2023-01-30 NOTE — ED Provider Notes (Signed)
[place]    CSN: [id number] Arrival date & time: 01/30/23  1502      History   Chief Complaint Chief Complaint  Patient presents with   Cough   Nasal Congestion    HPI Veronica [name] is a 32 y.o. female.   Patient presents today with a 2-day history of URI symptoms including congestion, cough, shortness of breath, itchy/watery eyes.  She denies any fever, chest pain, nausea, vomiting, diarrhea.  Denies any known sick contacts.  She has tried over-the-counter medication without improvement of symptoms.  She has had COVID in the past with last episode approximately 1 year ago.  She has had COVID-19 vaccinations.  She has a history of asthma and believes that her asthma has been flared she has been using her albuterol inhaler more frequently since onset.  Denies any recent antibiotics or steroids.  She does not smoke and denies any history of diabetes.  She is confident that she is not pregnant.  She does have allergies and uses fluticasone and cetirizine regularly to manage the symptoms.  Reports compliance with this regimen.    Past Medical History:  Diagnosis Date   Abdominal pain 01/06/2012   Acute tension headache 07/27/2019   Anemia    Ankle pain    Anxiety    Asthma    Birth control counseling 08/26/2011   BMI 40.0-44.9, adult ([place]) 02/27/2011   GERD (gastroesophageal reflux disease)    Heart murmur    Hiatal hernia    Obesity (BMI 30-39.9)    Pre-eclampsia    off med since 4/23   Secondary amenorrhea 03/25/2018    Patient Active Problem List   Diagnosis Date Noted   Trichomonas infection 06/17/2022   Nexplanon insertion 05/19/2022   Intertrigo 04/17/2022   Health maintenance examination 04/01/2022   Nocturnal oxygen desaturation 03/23/2022   AKI (acute kidney injury) ([place]) 03/23/2022   Gallstones    Sepsis ([place])    Pyelonephritis 03/10/2022   Allergic rhinitis 03/07/2022   Anxiety 11/26/2020   Asthma 08/31/2020   Back pain 02/13/2020    Hiatal hernia 11/02/2019   Epigastric pain 09/13/2019   Vaginal irritation 06/23/2019   Subclinical hypothyroidism 05/19/2019   GAD (generalized anxiety disorder) 05/09/2019   Chronic cough 10/16/2017   Dysuria 01/13/2013   Urinary frequency 09/23/2012   GERD (gastroesophageal reflux disease) 03/28/2011   BMI 50.0-59.9, adult ([place]) 02/27/2011   Anemia 02/27/2011    Past Surgical History:  Procedure Laterality Date   CESAREAN SECTION  09/02/2010   For macrosomia, but son was 7 lb 12 oz   CESAREAN SECTION N/A 10/06/2021   Procedure: CESAREAN SECTION;  Surgeon: Aletha Halim, MD;  Location: [facility];  Service: Obstetrics;  Laterality: N/A;   CHOLECYSTECTOMY N/A 06/16/2022   Procedure: LAPAROSCOPIC  CHOLECYSTECTOMY;  Surgeon: Clovis Riley, MD;  Location: [facility] ORS;  Service: General;  Laterality: N/A;   TONSILLECTOMY AND ADENOIDECTOMY      OB History     Gravida  2   Para  2   Term  2   Preterm      AB      Living  2      SAB      IAB      Ectopic      Multiple  0   Live Births  2            Home Medications    Prior to Admission medications   Medication Sig Start  Date End Date Taking? Authorizing Provider  cetirizine (ZYRTEC) 10 MG tablet Take 10 mg by mouth daily.   Yes [provider]  escitalopram (LEXAPRO) 10 MG tablet Take 1 tablet (10 mg total) by mouth daily. 10/13/22  Yes Zola Button, MD  ferrous sulfate 325 (65 FE) MG tablet Take 1 tablet (325 mg total) by mouth every other day. 12/28/22  Yes Paige, Victoria J, DO  fluticasone (FLONASE) 50 MCG/ACT nasal spray Place 2 sprays into both nostrils daily. 08/29/22  Yes Paige, Weldon Picking, DO  hydrOXYzine (ATARAX) 25 MG tablet Take 1 tablet (25 mg total) by mouth 3 (three) times daily as needed for anxiety. 01/30/22  Yes Pashayan, Redgie Grayer, MD  Multiple Vitamins-Minerals (ONE-A-DAY WOMENS PO) Take 1 tablet by mouth daily.   Yes [provider]  omeprazole (PRILOSEC) 20 MG capsule  Take 20 mg by mouth daily.   Yes [provider]  predniSONE (STERAPRED UNI-PAK 21 TAB) 10 MG (21) TBPK tablet As directed 01/30/23  Yes Adiba Fargnoli K, [name]  albuterol (VENTOLIN HFA) 108 (90 Base) MCG/ACT inhaler INHALE 2 PUFFS BY MOUTH EVERY 4 HOURS AS NEEDED FOR WHEEZING OR SHORTNESS OF BREATH (COUGH). 01/30/23   Morgaine Kimball, Derry Skill, [name]  etonogestrel (NEXPLANON) 68 MG IMPL implant 1 each by Subdermal route once.    [provider]  famotidine (PEPCID) 20 MG tablet Take 1 tablet by mouth once daily 12/15/22   Arby Barrette, [place] J, DO  fluticasone (FLOVENT HFA) 110 MCG/ACT inhaler Inhale 2 puffs into the lungs 2 (two) times daily. 01/30/23   Analy Bassford, Junie Panning K, [name]  polyethylene glycol (MIRALAX / GLYCOLAX) 17 g packet Take 17 g by mouth daily. 06/01/21   Gavin Pound, CNM    Family History Family History  Problem Relation Age of Onset   Diabetes Mother    Hypertension Mother    Obesity Mother    Heart attack Mother 53       Death   Obesity Sister    Osteochondroma Sister    Diabetes Sister    Hypertension Sister     Social History Social History   Tobacco Use   Smoking status: Never   Smokeless tobacco: Never  Vaping Use   Vaping Use: Never used  Substance Use Topics   Alcohol use: Yes    Comment: occ   Drug use: Not Currently    Comment: last used in teens     Allergies   Morphine and related and Motrin [ibuprofen]   Review of Systems Review of Systems  Constitutional:  Positive for activity change. Negative for appetite change, fatigue and fever.  HENT:  Positive for congestion. Negative for sinus pressure, sneezing and sore throat.   Respiratory:  Positive for cough and shortness of breath.   Cardiovascular:  Negative for chest pain.  Gastrointestinal:  Negative for abdominal pain, diarrhea, nausea and vomiting.  Neurological:  Negative for dizziness, light-headedness and headaches.     Physical Exam Triage Vital Signs ED Triage Vitals  Enc Vitals  Group     BP 01/30/23 1616 128/84     Pulse Rate 01/30/23 1616 75     Resp 01/30/23 1616 16     Temp 01/30/23 1616 99.1 F (37.3 C)     Temp Source 01/30/23 1616 Oral     SpO2 01/30/23 1616 97 %     Weight 01/30/23 1616 259 lb (117.5 kg)     Height 01/30/23 1616 '5\' 2"'$  (1.575 m)     Head  Circumference --      Peak Flow --      Pain Score 01/30/23 1613 0     Pain Loc --      Pain Edu? --      Excl. in [place]? --    No data found.  Updated Vital Signs BP 128/84 (BP Location: Left Arm)   Pulse 75   Temp 99.1 F (37.3 C) (Oral)   Resp 16   Ht '5\' 2"'$  (1.575 m)   Wt 259 lb (117.5 kg)   LMP  (LMP Unknown)   SpO2 97%   Breastfeeding No   BMI 47.37 kg/m   Visual Acuity Right Eye Distance:   Left Eye Distance:   Bilateral Distance:    Right Eye Near:   Left Eye Near:    Bilateral Near:     Physical Exam Vitals reviewed.  Constitutional:      General: She is awake. She is not in acute distress.    Appearance: Normal appearance. She is well-developed. She is not ill-appearing.     Comments: Very pleasant female appears stated age in no acute distress sitting comfortably in exam room  HENT:     Head: Normocephalic and atraumatic.     Right Ear: Ear canal and external ear normal. A middle ear effusion is present. Tympanic membrane is not erythematous or bulging.     Left Ear: Ear canal and external ear normal. A middle ear effusion is present. Tympanic membrane is not erythematous or bulging.     Nose:     Right Sinus: No maxillary sinus tenderness or frontal sinus tenderness.     Left Sinus: No maxillary sinus tenderness or frontal sinus tenderness.     Mouth/Throat:     Pharynx: Uvula midline. No oropharyngeal exudate or posterior oropharyngeal erythema.  Cardiovascular:     Rate and Rhythm: Normal rate and regular rhythm.     Heart sounds: Normal heart sounds, S1 normal and S2 normal. No murmur heard. Pulmonary:     Effort: Pulmonary effort is normal.     Breath sounds:  Normal breath sounds. No wheezing, rhonchi or rales.  Psychiatric:        Behavior: Behavior is cooperative.      UC Treatments / Results  Labs (all labs ordered are listed, but only abnormal results are displayed) Labs Reviewed  SARS CORONAVIRUS 2 (TAT 6-24 HRS)    EKG   Radiology No results found.  Procedures Procedures (including critical care time)  Medications Ordered in UC Medications - No data to display  Initial Impression / Assessment and Plan / UC Course  I have reviewed the triage vital signs and the nursing notes.  Pertinent labs & imaging results that were available during my care of the patient were reviewed by me and considered in my medical decision making (see chart for details).     Patient is well-appearing, afebrile, nontoxic, nontachycardic.  No evidence of acute infection on physical exam that warrant initiation of antibiotics.  She has been symptomatic for 2 to 3 days and so outside the window effectiveness for Tamiflu.  Influenza testing was deferred.  COVID testing was obtained and is pending.  Given her history of asthma she is a candidate for antiviral therapy.  Metabolic panel from [id number] showed normal kidney function with creatinine of 0.95 and EGFR of greater than 60 mL/min.  She would need to hold her hydroxyzine while on Paxlovid and for 3 days after completing course.  I am  concerned that her asthma has been flared by the recent viral infection.  She was provided a refill of her albuterol as well fluticasone maintenance medication.  Will start prednisone taper.  She was instructed not to take NSAIDs with this medication but can use additional over-the-counter medications including Mucinex, Flonase, Tylenol for symptom relief.  She is to rest and drink plenty of fluid.  Discussed that if she has any worsening or changing symptoms including persistent shortness of breath despite medication, worsening cough, fever, nausea, vomiting, weakness she needs  to be seen immediately.  Strict return precautions given.  Work excuse note with current CDC return to work guidelines based on COVID test result provided during visit today.  Final Clinical Impressions(s) / UC Diagnoses   Final diagnoses:  Upper respiratory tract infection, unspecified type  Moderate persistent asthma with acute exacerbation     Discharge Instructions      I believe you have a virus/allergies that flared your asthma.  We will contact you if you are positive for COVID.  Please start prednisone taper.  Do not take NSAIDs with this medication including aspirin, ibuprofen/Advil, naproxen/Aleve.  You can use acetaminophen/Tylenol as well as Mucinex and Flonase to help manage your symptoms.  I have called in a refill of your albuterol inhaler to have on hand.  I have also refilled her fluticasone.  Please continue this as previously prescribed.  Make sure to rinse your mouth following use of this medication to prevent thrush.  Follow-up with your primary care next week if symptoms have not resolved.  If you have any worsening symptoms including persistent cough/shortness of breath, wheezing, weakness, nausea, vomiting you need to be seen immediately.     ED Prescriptions     Medication Sig Dispense Auth. Provider   albuterol (VENTOLIN HFA) 108 (90 Base) MCG/ACT inhaler INHALE 2 PUFFS BY MOUTH EVERY 4 HOURS AS NEEDED FOR WHEEZING OR SHORTNESS OF BREATH (COUGH). 18 g Melanee Cordial K, [name]   predniSONE (STERAPRED UNI-PAK 21 TAB) 10 MG (21) TBPK tablet As directed 21 tablet Emoree Sasaki K, [name]   fluticasone (FLOVENT HFA) 110 MCG/ACT inhaler Inhale 2 puffs into the lungs 2 (two) times daily. 1 each Dailee Manalang, Derry Skill, [name]      PDMP not reviewed this encounter.   Terrilee Croak, [name] 01/30/23 1646

## 2023-01-31 LAB — SARS CORONAVIRUS 2 (TAT 6-24 HRS): SARS Coronavirus 2: POSITIVE — AB

## 2023-02-06 DIAGNOSIS — Z419 Encounter for procedure for purposes other than remedying health state, unspecified: Secondary | ICD-10-CM | POA: Diagnosis not present

## 2023-02-13 ENCOUNTER — Ambulatory Visit (INDEPENDENT_AMBULATORY_CARE_PROVIDER_SITE_OTHER): Payer: Medicaid Other | Admitting: Family Medicine

## 2023-02-13 ENCOUNTER — Encounter: Payer: Self-pay | Admitting: Family Medicine

## 2023-02-13 ENCOUNTER — Other Ambulatory Visit: Payer: Self-pay

## 2023-02-13 VITALS — BP 118/78 | HR 76 | Wt 257.0 lb

## 2023-02-13 DIAGNOSIS — F411 Generalized anxiety disorder: Secondary | ICD-10-CM | POA: Diagnosis not present

## 2023-02-13 DIAGNOSIS — D649 Anemia, unspecified: Secondary | ICD-10-CM | POA: Diagnosis not present

## 2023-02-13 DIAGNOSIS — K219 Gastro-esophageal reflux disease without esophagitis: Secondary | ICD-10-CM | POA: Diagnosis present

## 2023-02-13 MED ORDER — FERROUS SULFATE 325 (65 FE) MG PO TABS: 325.0000 mg | ORAL_TABLET | ORAL | 0 refills | Status: DC

## 2023-02-13 MED ORDER — ESCITALOPRAM OXALATE 10 MG PO TABS: 10.0000 mg | ORAL_TABLET | Freq: Every day | ORAL | 0 refills | Status: DC

## 2023-02-13 MED ORDER — HYDROXYZINE HCL 25 MG PO TABS: 25.0000 mg | ORAL_TABLET | Freq: Three times a day (TID) | ORAL | 1 refills | Status: DC | PRN

## 2023-02-13 NOTE — Patient Instructions (Signed)
It was great seeing you today!  I sent in a referral to GI for your acid reflux and they will call you to schedule that appointment  Continue your Pecid daily and Tums as needed until then  I have also refilled some of your medication  Feel free to call with any questions or concerns at any time, at [phone].   Take care,  Dr. Shary Key [facility]

## 2023-02-13 NOTE — Progress Notes (Unsigned)
    SUBJECTIVE:   CHIEF COMPLAINT / HPI:   Presents for follow up on acid reflux  Stopped pepcid and started PPI. Was having headaches and sdtomach pain on the PPI but now after stopping it a week ago feels better. Would like GI referral since not improving despite. Open to keeping log about   Needs refills of Lexapro and hydroxyzine as well as iron   Needs pepcid   Started back at the gym on Monday  Was previuosly eating    PERTINENT  PMH / PSH: ***  OBJECTIVE:   BP 118/78   Pulse 76   Wt 257 lb (116.6 kg)   LMP  (LMP Unknown)   SpO2 100%   BMI 47.01 kg/m   ***  ASSESSMENT/PLAN:   No problem-specific Assessment & Plan notes found for this encounter.     [place]

## 2023-02-16 ENCOUNTER — Other Ambulatory Visit: Payer: Self-pay | Admitting: Family Medicine

## 2023-02-16 NOTE — Assessment & Plan Note (Signed)
Has had several months of severe burning after food intake, currently on Pepcid given she was not able to tolerate a PPI.  Referral to GI for further evaluation given symptoms not improved with medication.  Discussed foods to avoid with her reflux, and recommended keeping a food log of what she eats when the symptoms occur - refilled Pepcid - GI referral placed

## 2023-02-27 ENCOUNTER — Encounter: Payer: Self-pay | Admitting: Adult Health

## 2023-02-27 ENCOUNTER — Ambulatory Visit: Payer: Medicaid Other | Admitting: Adult Health

## 2023-02-27 VITALS — BP 102/80 | HR 82 | Temp 99.1°F | Ht 62.0 in | Wt 261.0 lb

## 2023-02-27 DIAGNOSIS — K219 Gastro-esophageal reflux disease without esophagitis: Secondary | ICD-10-CM

## 2023-02-27 DIAGNOSIS — J4520 Mild intermittent asthma, uncomplicated: Secondary | ICD-10-CM | POA: Diagnosis not present

## 2023-02-27 LAB — POCT EXHALED NITRIC OXIDE: FeNO level (ppb): 15

## 2023-02-27 MED ORDER — PANTOPRAZOLE SODIUM 40 MG PO TBEC: 40.0000 mg | DELAYED_RELEASE_TABLET | Freq: Every day | ORAL | 1 refills | Status: DC

## 2023-02-27 MED ORDER — FLUTICASONE PROPIONATE HFA 110 MCG/ACT IN AERO: 2.0000 | INHALATION_SPRAY | Freq: Two times a day (BID) | RESPIRATORY_TRACT | 5 refills | Status: DC

## 2023-02-27 NOTE — Assessment & Plan Note (Signed)
Severe reflux.  Patient has been referred to GI by her primary care provider.  In the meantime would restart PPI therapy.  Begin Protonix daily.  Continue on Pepcid at bedtime.  GERD diet.  May try Gas-X with meals for bloating.  Plan  Patient Instructions  Restart Flovent 2 puffs Twice daily, rinse after use.  Albuterol inhaler As needed   Change Zyrtec to Claritin daily  Begin Protonix 40mg  daily in am .  GERD diet  Continue Pepcid 20mg  At bedtime   Try Gas X with meals.  Follow up GI as planned  Follow up 3 months and As needed   Please contact office for sooner follow up if symptoms do not improve or worsen or seek emergency care

## 2023-02-27 NOTE — Progress Notes (Signed)
@Patient  ID: Veronica [name], female    DOB: 28-Jan-1991, 32 y.o.   MRN: [id number]  Chief Complaint  Patient presents with   Follow-up    Referring provider: Shary Key, DO  HPI: 32 year old female never smoker followed for mild intermittent asthma, chronic cough aggravated by GERD and seasonal allergies  TEST/EVENTS :  Pulmonary function testing performed on 06/09/2022 showed normal airflows without a bronchodilator response, normal lung volumes and normal diffusion capacity   Sleep study 06/08/2022 showed a total sleep time of 347.5 minutes, AHI 0/h without any evidence for obstructive sleep apnea, no desaturations.  02/27/2023 Follow up : Mild intermittent asthma, chronic cough Patient presents for a acute office visit. Complains over the last 4 weeks she has had increased asthma symptoms.  She had COVID-19 infection with upper respiratory symptoms 1 month ago. Seen at urgent care with treated with a prednisone taper.  Patient says this did help but she continues to have some intermittent cough wheezing and shortness of breath.  She denies any fever or discolored mucus.  Says mainly she has been having lots of reflux. Having severe reflux into her throat . Previously on Protonix which seemed to work okay with reflux. Was stopped unsure why. Started on Prilosec but unable to tolerate. Now on Pepcid At bedtime  but is not working. Gets worse eating and associated with shortness of breath feels when she eats she can't get deep breath in. Feels bloated.  GI referral is pending .  She was taken off of Flovent last year says that her breathing did okay but she feels like she was better while taking Flovent.  She has been using her albuterol with some relief. Exhaled nitric oxide testing today is normal 15ppb   Allergies  Allergen Reactions   Morphine And Related Other (See Comments)    Causes "chest to be heavy"   Motrin [Ibuprofen]     Was told to not take this because it would  flare stomach issues    Immunization History  Administered Date(s) Administered   Influenza Split 10/20/2011   Influenza,inj,Quad PF,6+ Mos 10/07/2019, 08/31/2020, 08/28/2021, 09/08/2022   Moderna Sars-Covid-2 Vaccination 10/29/2020, 11/26/2020   PPD Test 09/07/2018, 04/29/2022   Tdap 10/20/2011, 07/10/2021    Past Medical History:  Diagnosis Date   Abdominal pain 01/06/2012   Acute tension headache 07/27/2019   Anemia    Ankle pain    Anxiety    Asthma    Birth control counseling 08/26/2011   BMI 40.0-44.9, adult ([place]) 02/27/2011   GERD (gastroesophageal reflux disease)    Heart murmur    Hiatal hernia    Obesity (BMI 30-39.9)    Pre-eclampsia    off med since 4/23   Secondary amenorrhea 03/25/2018    Tobacco History: Social History   Tobacco Use  Smoking Status Never  Smokeless Tobacco Never   Counseling given: Not Answered   Outpatient Medications Prior to Visit  Medication Sig Dispense Refill   albuterol (VENTOLIN HFA) 108 (90 Base) MCG/ACT inhaler INHALE 2 PUFFS BY MOUTH EVERY 4 HOURS AS NEEDED FOR WHEEZING OR SHORTNESS OF BREATH (COUGH). 18 g 0   cetirizine (ZYRTEC) 10 MG tablet Take 10 mg by mouth daily.     escitalopram (LEXAPRO) 10 MG tablet Take 1 tablet (10 mg total) by mouth daily. 30 tablet 0   etonogestrel (NEXPLANON) 68 MG IMPL implant 1 each by Subdermal route once.     famotidine (PEPCID) 20 MG tablet Take 1 tablet by  mouth once daily 30 tablet 0   ferrous sulfate 325 (65 FE) MG tablet Take 1 tablet (325 mg total) by mouth every other day. 30 tablet 0   fluticasone (FLONASE) 50 MCG/ACT nasal spray Place 2 sprays into both nostrils daily. 16 g 6   hydrOXYzine (ATARAX) 25 MG tablet Take 1 tablet (25 mg total) by mouth 3 (three) times daily as needed for anxiety. 90 tablet 1   Multiple Vitamins-Minerals (ONE-A-DAY WOMENS PO) Take 1 tablet by mouth daily.     polyethylene glycol (MIRALAX / GLYCOLAX) 17 g packet Take 17 g by mouth daily. 14 each 1    predniSONE (STERAPRED UNI-PAK 21 TAB) 10 MG (21) TBPK tablet As directed 21 tablet 0   omeprazole (PRILOSEC) 20 MG capsule Take 20 mg by mouth daily.     fluticasone (FLOVENT HFA) 110 MCG/ACT inhaler Inhale 2 puffs into the lungs 2 (two) times daily. (Patient not taking: Reported on 02/27/2023) 1 each 0   No facility-administered medications prior to visit.     Review of Systems:   Constitutional:   No  weight loss, night sweats,  Fevers, chills, fatigue, or  lassitude.  HEENT:   No headaches,  Difficulty swallowing,  Tooth/dental problems, or  Sore throat,                No sneezing, itching, ear ache, +++nasal congestion, post nasal drip,   CV:  No chest pain,  Orthopnea, PND, swelling in lower extremities, anasarca, dizziness, palpitations, syncope.   GI  No abdominal pain, nausea, vomiting, diarrhea, change in bowel habits, loss of appetite, bloody stools.   Resp: .  No chest wall deformity  Skin: no rash or lesions.  GU: no dysuria, change in color of urine, no urgency or frequency.  No flank pain, no hematuria   MS:  No joint pain or swelling.  No decreased range of motion.  No back pain.    Physical Exam  BP 102/80 (BP Location: Left Arm, Patient Position: Sitting, Cuff Size: Large)   Pulse 82   Temp 99.1 F (37.3 C) (Oral)   Ht 5\' 2"  (1.575 m)   Wt 261 lb (118.4 kg)   LMP  (LMP Unknown)   SpO2 100%   BMI 47.74 kg/m   GEN: A/Ox3; pleasant , NAD, well nourished    HEENT:  [place]/AT,  EACs-clear, TMs-wnl, NOSE-clear, THROAT-clear, no lesions, no postnasal drip or exudate noted.   NECK:  Supple w/ fair ROM; no JVD; normal carotid impulses w/o bruits; no thyromegaly or nodules palpated; no lymphadenopathy.  No stridor.  RESP  Clear  P & A; w/o, wheezes/ rales/ or rhonchi. no accessory muscle use, no dullness to percussion speaks in full sentences.  CARD:  RRR, no m/r/g, no peripheral edema, pulses intact, no cyanosis or clubbing.  GI:   Soft & nt; nml bowel sounds;  no organomegaly or masses detected.   Musco: Warm bil, no deformities or joint swelling noted.   Neuro: alert, no focal deficits noted.    Skin: Warm, no lesions or rashes    Lab Results:  CBC   BMET  ProBNP No results found for: "PROBNP"  Imaging: No results found.       Latest Ref Rng & Units 06/09/2022    3:32 PM 04/06/2020    1:56 PM  PFT Results  FVC-Pre L 3.19  2.89   FVC-Predicted Pre % 90  95   FVC-Post L 3.12  3.01   FVC-Predicted Post %  88  99   Pre FEV1/FVC % % 86  78   Post FEV1/FCV % % 88  82   FEV1-Pre L 2.73  2.24   FEV1-Predicted Pre % 90  85   FEV1-Post L 2.75  2.47   DLCO uncorrected ml/min/mmHg 24.54  29.10   DLCO UNC% % 117  139   DLCO corrected ml/min/mmHg 25.63  29.10   DLCO COR %Predicted % 122  139   DLVA Predicted % 127  149   TLC L 4.59  4.35   TLC % Predicted % 96  91   RV % Predicted % 111  102     No results found for: "NITRICOXIDE"      Assessment & Plan:   Asthma Mild intermittent asthma versus reactive airways with increased symptom burden after recent viral illness.  Also component of GERD and chronic rhinitis. Control for triggers.  Hold on additional steroids at this time.  Will restart Flovent.  Change Zyrtec to Claritin.  Begin Protonix.  Plan Patient Instructions  Restart Flovent 2 puffs Twice daily, rinse after use.  Albuterol inhaler As needed   Change Zyrtec to Claritin daily  Begin Protonix 40mg  daily in am .  GERD diet  Continue Pepcid 20mg  At bedtime   Try Gas X with meals.  Follow up GI as planned  Follow up 3 months and As needed   Please contact office for sooner follow up if symptoms do not improve or worsen or seek emergency care         GERD (gastroesophageal reflux disease) Severe reflux.  Patient has been referred to GI by her primary care provider.  In the meantime would restart PPI therapy.  Begin Protonix daily.  Continue on Pepcid at bedtime.  GERD diet.  May try Gas-X with meals for  bloating.  Plan  Patient Instructions  Restart Flovent 2 puffs Twice daily, rinse after use.  Albuterol inhaler As needed   Change Zyrtec to Claritin daily  Begin Protonix 40mg  daily in am .  GERD diet  Continue Pepcid 20mg  At bedtime   Try Gas X with meals.  Follow up GI as planned  Follow up 3 months and As needed   Please contact office for sooner follow up if symptoms do not improve or worsen or seek emergency care           Rexene Edison, NP 02/27/2023

## 2023-02-27 NOTE — Assessment & Plan Note (Signed)
Mild intermittent asthma versus reactive airways with increased symptom burden after recent viral illness.  Also component of GERD and chronic rhinitis. Control for triggers.  Hold on additional steroids at this time.  Will restart Flovent.  Change Zyrtec to Claritin.  Begin Protonix.  Plan Patient Instructions  Restart Flovent 2 puffs Twice daily, rinse after use.  Albuterol inhaler As needed   Change Zyrtec to Claritin daily  Begin Protonix 40mg  daily in am .  GERD diet  Continue Pepcid 20mg  At bedtime   Try Gas X with meals.  Follow up GI as planned  Follow up 3 months and As needed   Please contact office for sooner follow up if symptoms do not improve or worsen or seek emergency care

## 2023-02-27 NOTE — Patient Instructions (Addendum)
Restart Flovent 2 puffs Twice daily, rinse after use.  Albuterol inhaler As needed   Change Zyrtec to Claritin daily  Begin Protonix 40mg  daily in am .  GERD diet  Continue Pepcid 20mg  At bedtime   Try Gas X with meals.  Follow up GI as planned  Follow up 3 months and As needed   Please contact office for sooner follow up if symptoms do not improve or worsen or seek emergency care

## 2023-03-13 ENCOUNTER — Encounter: Payer: Self-pay | Admitting: Family Medicine

## 2023-03-13 ENCOUNTER — Ambulatory Visit (INDEPENDENT_AMBULATORY_CARE_PROVIDER_SITE_OTHER): Payer: Medicaid Other | Admitting: Family Medicine

## 2023-03-13 VITALS — BP 128/84 | HR 63 | Wt 258.4 lb

## 2023-03-13 DIAGNOSIS — Z6841 Body Mass Index (BMI) 40.0 and over, adult: Secondary | ICD-10-CM

## 2023-03-13 DIAGNOSIS — R5383 Other fatigue: Secondary | ICD-10-CM | POA: Diagnosis present

## 2023-03-13 DIAGNOSIS — F419 Anxiety disorder, unspecified: Secondary | ICD-10-CM

## 2023-03-13 DIAGNOSIS — D649 Anemia, unspecified: Secondary | ICD-10-CM

## 2023-03-13 LAB — POCT GLYCOSYLATED HEMOGLOBIN (HGB A1C): Hemoglobin A1C: 5.4 % (ref 4.0–5.6)

## 2023-03-13 NOTE — Patient Instructions (Addendum)
It was great seeing you today!  If your anxiety is worsening you can start taking 2 of your Lexapro (20mg ). If this helps let me know and I can send in a new prescription for 20mg .   I have placed your future lab orders and you can come back to the clinic whenever you are able to get that done  You can call the GI doctor to schedule an appointment [facility] Address: [address], [place], [place] [address] Phone: [phone]  Feel free to call with any questions or concerns at any time, at [phone].   Take care,  Dr. Cora Collum [facility]

## 2023-03-13 NOTE — Progress Notes (Unsigned)
    SUBJECTIVE:   CHIEF COMPLAINT / HPI:   Patient presents to  check thyroid, cholesterol and sugar  States she feels tired. Goes to bed at 10pm and wakes 6:30am. States she has had a sleep study done which did not show anything. Feels like she is having increased anxiety waking her up about twice a week. Currently on Lexapro 10mg  and open to increasing dose    Takes Protonix 40mg  daily for acid reflux and feels it is helping her reflux better  Asks to  check on GI referral    PERTINENT  PMH / PSH: Reviewed   OBJECTIVE:   BP 128/84 (BP Location: Left Arm, Patient Position: Sitting, Cuff Size: Large)   Pulse 63   Wt 258 lb 6.4 oz (117.2 kg)   SpO2 100%   BMI 47.26 kg/m    Physical exam General: well appearing, NAD Cardiovascular: RRR, no murmurs Lungs: CTAB. Normal WOB Abdomen: soft, non-distended, non-tender Skin: warm, dry. No edema  ASSESSMENT/PLAN:   Anxiety Having increased anxiety lately that she feels is waking her up out of her sleep a couple of times a week. Currently on Lexapro 10mg  but is hesitant about increasing dose. Advised her to take 2 (20mg ) and if she is able to tolerate it I will send in new Rx for 20mg  daily. Will continue to monitor   Fatigue Patient feels tired despite getting adequate amount of sleep. Sleep study performed previously was unremarkable. Discussed weight loss would be beneficial and may help her symptoms. She has not been exercising. Discussed dietary changes and exercise. Increased anxiety and night wakenings because of it could be contributing. We have tried prescribing GLP-1 without success but will try again after labs return. Will check CBC, TSH, A1c, lipid panel, insulin level. Lab closed so future orders placed.      Cora Collum, DO [facility]

## 2023-03-15 NOTE — Assessment & Plan Note (Signed)
Having increased anxiety lately that she feels is waking her up out of her sleep a couple of times a week. Currently on Lexapro 10mg  but is hesitant about increasing dose. Advised her to take 2 (20mg ) and if she is able to tolerate it I will send in new Rx for 20mg  daily. Will continue to monitor

## 2023-03-15 NOTE — Assessment & Plan Note (Signed)
>>  ASSESSMENT AND PLAN FOR ANXIETY WRITTEN ON 03/15/2023  1:47 PM BY PAIGE, VICTORIA J, DO  Having increased anxiety lately that she feels is waking her up out of her sleep a couple of times a week. Currently on Lexapro 10mg  but is hesitant about increasing dose. Advised her to take 2 (20mg ) and if she is able to tolerate it I will send in new Rx for 20mg  daily. Will continue to monitor

## 2023-03-15 NOTE — Assessment & Plan Note (Signed)
Patient feels tired despite getting adequate amount of sleep. Sleep study performed previously was unremarkable. Discussed weight loss would be beneficial and may help her symptoms. She has not been exercising. Discussed dietary changes and exercise. Increased anxiety and night wakenings because of it could be contributing. We have tried prescribing GLP-1 without success but will try again after labs return. Will check CBC, TSH, A1c, lipid panel, insulin level. Lab closed so future orders placed.

## 2023-03-16 ENCOUNTER — Ambulatory Visit (INDEPENDENT_AMBULATORY_CARE_PROVIDER_SITE_OTHER): Payer: Medicaid Other | Admitting: Family Medicine

## 2023-03-16 ENCOUNTER — Other Ambulatory Visit: Payer: Self-pay | Admitting: Family Medicine

## 2023-03-16 DIAGNOSIS — D649 Anemia, unspecified: Secondary | ICD-10-CM | POA: Diagnosis not present

## 2023-03-16 DIAGNOSIS — Z6841 Body Mass Index (BMI) 40.0 and over, adult: Secondary | ICD-10-CM

## 2023-03-16 DIAGNOSIS — F419 Anxiety disorder, unspecified: Secondary | ICD-10-CM | POA: Diagnosis not present

## 2023-03-16 NOTE — Progress Notes (Signed)
Patient presented today for lab visit since the lab was closed at our visit 4/5. Signing encounter since it was scheduled as an office visit and not lab visit.

## 2023-03-17 ENCOUNTER — Other Ambulatory Visit: Payer: Self-pay | Admitting: Family Medicine

## 2023-03-17 DIAGNOSIS — E88819 Insulin resistance, unspecified: Secondary | ICD-10-CM

## 2023-03-17 LAB — CBC
Hematocrit: 37.3 % (ref 34.0–46.6)
Hemoglobin: 12.2 g/dL (ref 11.1–15.9)
MCH: 28 pg (ref 26.6–33.0)
MCHC: 32.7 g/dL (ref 31.5–35.7)
MCV: 86 fL (ref 79–97)
Platelets: 321 10*3/uL (ref 150–450)
RBC: 4.36 x10E6/uL (ref 3.77–5.28)
RDW: 13.4 % (ref 11.7–15.4)
WBC: 7 10*3/uL (ref 3.4–10.8)

## 2023-03-17 LAB — INSULIN, RANDOM: INSULIN: 45.2 u[IU]/mL — ABNORMAL HIGH (ref 2.6–24.9)

## 2023-03-17 LAB — LIPID PANEL
Chol/HDL Ratio: 3.9 ratio (ref 0.0–4.4)
Cholesterol, Total: 136 mg/dL (ref 100–199)
HDL: 35 mg/dL — ABNORMAL LOW (ref >39)
LDL Chol Calc (NIH): 75 mg/dL (ref 0–99)
Triglycerides: 148 mg/dL (ref 0–149)
VLDL Cholesterol Cal: 26 mg/dL (ref 5–40)

## 2023-03-17 LAB — TSH: TSH: 1.99 u[IU]/mL (ref 0.450–4.500)

## 2023-03-17 MED ORDER — TIRZEPATIDE 2.5 MG/0.5ML ~~LOC~~ SOAJ: 2.5000 mg | SUBCUTANEOUS | 0 refills | Status: DC

## 2023-03-18 ENCOUNTER — Encounter (HOSPITAL_COMMUNITY): Payer: Self-pay

## 2023-03-18 ENCOUNTER — Ambulatory Visit (HOSPITAL_COMMUNITY)
Admission: EM | Admit: 2023-03-18 | Discharge: 2023-03-18 | Disposition: A | Discharge status: Home / Self Care | Payer: Medicaid Other | Attending: Family Medicine | Admitting: Family Medicine

## 2023-03-18 DIAGNOSIS — J309 Allergic rhinitis, unspecified: Secondary | ICD-10-CM | POA: Diagnosis not present

## 2023-03-18 DIAGNOSIS — H6993 Unspecified Eustachian tube disorder, bilateral: Secondary | ICD-10-CM | POA: Diagnosis not present

## 2023-03-18 DIAGNOSIS — J029 Acute pharyngitis, unspecified: Secondary | ICD-10-CM

## 2023-03-18 MED ORDER — PREDNISONE 20 MG PO TABS: 40.0000 mg | ORAL_TABLET | Freq: Every day | ORAL | 0 refills | Status: AC

## 2023-03-18 NOTE — ED Triage Notes (Signed)
Pt states when she swallows her thoat and ears hurt x 2days , pt has a little nasal congestion . Pt has not used any OTC meds .pt also has bennhaving SOB.

## 2023-03-18 NOTE — ED Provider Notes (Signed)
[facility]    CSN: [id number] Arrival date & time: 03/18/23  1451      History   Chief Complaint Chief Complaint  Patient presents with   Sore Throat   Otalgia    HPI Veronica [name] is a 32 y.o. female.   Patient is here for sore throat with swallowing, as well as ear pain.  Started 2 days ago.   Mild runny nose, not a lot of congestion.  She is having a mild cough as well.  No fevers/chills.   No otc meds taken.        Past Medical History:  Diagnosis Date   Abdominal pain 01/06/2012   Acute tension headache 07/27/2019   Anemia    Ankle pain    Anxiety    Asthma    Birth control counseling 08/26/2011   BMI 40.0-44.9, adult 02/27/2011   GERD (gastroesophageal reflux disease)    Heart murmur    Hiatal hernia    Obesity (BMI 30-39.9)    Pre-eclampsia    off med since 4/23   Secondary amenorrhea 03/25/2018    Patient Active Problem List   Diagnosis Date Noted   Trichomonas infection 06/17/2022   Nexplanon insertion 05/19/2022   Intertrigo 04/17/2022   Health maintenance examination 04/01/2022   Nocturnal oxygen desaturation 03/23/2022   AKI (acute kidney injury) 03/23/2022   Gallstones    Sepsis    Pyelonephritis 03/10/2022   Allergic rhinitis 03/07/2022   Anxiety 11/26/2020   Asthma 08/31/2020   Back pain 02/13/2020   Hiatal hernia 11/02/2019   Epigastric pain 09/13/2019   Vaginal irritation 06/23/2019   Subclinical hypothyroidism 05/19/2019   GAD (generalized anxiety disorder) 05/09/2019   Chronic cough 10/16/2017   Fatigue 05/09/2016   Dysuria 01/13/2013   Urinary frequency 09/23/2012   GERD (gastroesophageal reflux disease) 03/28/2011   BMI 50.0-59.9, adult 02/27/2011   Anemia 02/27/2011    Past Surgical History:  Procedure Laterality Date   CESAREAN SECTION  09/02/2010   For macrosomia, but son was 7 lb 12 oz   CESAREAN SECTION N/A 10/06/2021   Procedure: CESAREAN SECTION;  Surgeon: [place] BingPickens, Charlie, MD;  Location: [facility];  Service: Obstetrics;  Laterality: N/A;   CHOLECYSTECTOMY N/A 06/16/2022   Procedure: LAPAROSCOPIC  CHOLECYSTECTOMY;  Surgeon: Berna Bueonnor, Chelsea A, MD;  Location: [facility];  Service: General;  Laterality: N/A;   TONSILLECTOMY AND ADENOIDECTOMY      OB History     Gravida  2   Para  2   Term  2   Preterm      AB      Living  2      SAB      IAB      Ectopic      Multiple  0   Live Births  2            Home Medications    Prior to Admission medications   Medication Sig Start Date End Date Taking? Authorizing Provider  escitalopram (LEXAPRO) 10 MG tablet Take 1 tablet (10 mg total) by mouth daily. 02/13/23  Yes Paige, Lucas MallowVictoria J, DO  etonogestrel (NEXPLANON) 68 MG IMPL implant 1 each by Subdermal route once.   Yes [provider]  famotidine (PEPCID) 20 MG tablet Take 1 tablet by mouth once daily 02/17/23  Yes Paige, Victoria J, DO  ferrous sulfate 325 (65 FE) MG tablet Take 1 tablet (325 mg total) by mouth every other day. 02/13/23  Yes  Idalia Needle, Turkey J, DO  fluticasone (FLONASE) 50 MCG/ACT nasal spray Place 2 sprays into both nostrils daily. 08/29/22  Yes Paige, Turkey J, DO  fluticasone (FLOVENT HFA) 110 MCG/ACT inhaler Inhale 2 puffs into the lungs 2 (two) times daily. 02/27/23  Yes Parrett, Tammy S, NP  hydrOXYzine (ATARAX) 25 MG tablet Take 1 tablet (25 mg total) by mouth 3 (three) times daily as needed for anxiety. 02/13/23  Yes Paige, Lucas Mallow, DO  Multiple Vitamins-Minerals (ONE-A-DAY WOMENS PO) Take 1 tablet by mouth daily.   Yes [provider]  pantoprazole (PROTONIX) 40 MG tablet Take 1 tablet (40 mg total) by mouth daily. 02/27/23 02/27/24 Yes Parrett, Tammy S, NP  polyethylene glycol (MIRALAX / GLYCOLAX) 17 g packet Take 17 g by mouth daily. 06/01/21  Yes Gerrit Heck, CNM  albuterol (VENTOLIN HFA) 108 (90 Base) MCG/ACT inhaler INHALE 2 PUFFS BY MOUTH EVERY 4 HOURS AS NEEDED FOR WHEEZING OR SHORTNESS OF BREATH (COUGH). 01/30/23   Raspet,  Noberto Retort, [name]  cetirizine (ZYRTEC) 10 MG tablet Take 10 mg by mouth daily.    [provider]  predniSONE (STERAPRED UNI-PAK 21 TAB) 10 MG (21) TBPK tablet As directed 01/30/23   Raspet, Naethan Bracewell K, [name]  tirzepatide [facility]) 2.5 MG/0.5ML Pen Inject 2.5 mg into the skin once a week. 03/17/23   Cora Collum, DO    Family History Family History  Problem Relation Age of Onset   Diabetes Mother    Hypertension Mother    Obesity Mother    Heart attack Mother 55       Death   Obesity Sister    Osteochondroma Sister    Diabetes Sister    Hypertension Sister     Social History Social History   Tobacco Use   Smoking status: Never   Smokeless tobacco: Never  Vaping Use   Vaping Use: Never used  Substance Use Topics   Alcohol use: Yes    Comment: occ   Drug use: Not Currently    Comment: last used in teens     Allergies   Morphine, Ibuprofen, and Morphine and related   Review of Systems Review of Systems  Constitutional: Negative.   HENT:  Positive for congestion, ear pain, rhinorrhea and sore throat.   Gastrointestinal: Negative.   Musculoskeletal: Negative.   Psychiatric/Behavioral: Negative.       Physical Exam Triage Vital Signs ED Triage Vitals  Enc Vitals Group     BP 03/18/23 1634 136/83     Pulse Rate 03/18/23 1634 81     Resp 03/18/23 1634 16     Temp 03/18/23 1634 98.5 F (36.9 C)     Temp Source 03/18/23 1634 Oral     SpO2 03/18/23 1634 97 %     Weight --      Height --      Head Circumference --      Peak Flow --      Pain Score 03/18/23 1637 6     Pain Loc --      Pain Edu? --      Excl. in GC? --    No data found.  Updated Vital Signs BP 136/83 (BP Location: Right Arm)   Pulse 81   Temp 98.5 F (36.9 C) (Oral)   Resp 16   SpO2 97%   Visual Acuity Right Eye Distance:   Left Eye Distance:   Bilateral Distance:    Right Eye Near:   Left Eye Near:  Bilateral Near:     Physical Exam Constitutional:      Appearance: She  is well-developed.  HENT:     Right Ear: A middle ear effusion is present.     Left Ear: A middle ear effusion is present.     Mouth/Throat:     Mouth: Mucous membranes are moist. No oral lesions.     Pharynx: No pharyngeal swelling, oropharyngeal exudate, posterior oropharyngeal erythema or uvula swelling.     Tonsils: No tonsillar exudate. 0 on the right. 0 on the left.  Cardiovascular:     Rate and Rhythm: Normal rate and regular rhythm.  Pulmonary:     Effort: Pulmonary effort is normal.  Musculoskeletal:     Cervical back: Normal range of motion and neck supple.  Lymphadenopathy:     Cervical: No cervical adenopathy.  Neurological:     General: No focal deficit present.     Mental Status: She is alert.  Psychiatric:        Mood and Affect: Mood normal.      UC Treatments / Results  Labs (all labs ordered are listed, but only abnormal results are displayed) Labs Reviewed - No data to display  EKG   Radiology No results found.  Procedures Procedures (including critical care time)  Medications Ordered in UC Medications - No data to display  Initial Impression / Assessment and Plan / UC Course  I have reviewed the triage vital signs and the nursing notes.  Pertinent labs & imaging results that were available during my care of the patient were reviewed by me and considered in my medical decision making (see chart for details).   Final Clinical Impressions(s) / UC Diagnoses   Final diagnoses:  Allergic rhinitis, unspecified seasonality, unspecified trigger  Sore throat  Eustachian tube dysfunction, bilateral     Discharge Instructions      You were seen today for sore throat and ear pain.  I see no evidence of bacterial infection at this time.  I think this is allergy related.  I recommend you take over the counter claritin or allegra daily.  I have sent out an oral prednisone to take daily x 5 days.  After that you may use over the counter flonase if  needed.  If you continue with symptoms that are worsening, or you develop a fever then please return for further evaluation.      ED Prescriptions     Medication Sig Dispense Auth. Provider   predniSONE (DELTASONE) 20 MG tablet Take 2 tablets (40 mg total) by mouth daily for 5 days. 10 tablet Jannifer Franklin, MD      PDMP not reviewed this encounter.   Jannifer Franklin, MD 03/20/23 [id number]

## 2023-03-18 NOTE — Discharge Instructions (Signed)
You were seen today for sore throat and ear pain.  I see no evidence of bacterial infection at this time.  I think this is allergy related.  I recommend you take over the counter claritin or allegra daily.  I have sent out an oral prednisone to take daily x 5 days.  After that you may use over the counter flonase if needed.  If you continue with symptoms that are worsening, or you develop a fever then please return for further evaluation.

## 2023-03-26 ENCOUNTER — Encounter: Payer: Self-pay | Admitting: Family Medicine

## 2023-03-26 ENCOUNTER — Ambulatory Visit (INDEPENDENT_AMBULATORY_CARE_PROVIDER_SITE_OTHER): Payer: Medicaid Other | Admitting: Family Medicine

## 2023-03-26 VITALS — BP 115/69 | HR 79 | Ht 62.0 in | Wt 257.8 lb

## 2023-03-26 DIAGNOSIS — E88819 Insulin resistance, unspecified: Secondary | ICD-10-CM

## 2023-03-26 DIAGNOSIS — Z6841 Body Mass Index (BMI) 40.0 and over, adult: Secondary | ICD-10-CM | POA: Diagnosis not present

## 2023-03-26 MED ORDER — TIRZEPATIDE 5 MG/0.5ML ~~LOC~~ SOAJ: 5.0000 mg | SUBCUTANEOUS | 0 refills | Status: DC

## 2023-03-26 NOTE — Progress Notes (Signed)
    SUBJECTIVE:   CHIEF COMPLAINT / HPI:   Patient presents for medication follow up. She started [facility] on Tuesday. States she felt an increased energy then. On wed started having more back pain but has been working out, last work out on Tuesday ngiht. Denies any nausea, stomach upset, diarrhea or constipation. She has Nexplanon in place and wants to make sure the medication is fine and wont decrease efficacy of Nexplanon.   PERTINENT  PMH / PSH: Reviewed   OBJECTIVE:   BP 115/69   Pulse 79   Ht  (1.575 m)   Wt 257 lb 12.8 oz (116.9 kg)   SpO2 100%   BMI 47.15 kg/m    Physical exam General: well appearing, NAD Cardiovascular: RRR, no murmurs Lungs: CTAB. Normal WOB Abdomen: soft, non-distended, non-tender Skin: warm, dry. No edema  ASSESSMENT/PLAN:   Obesity BMI 47.15. Took first dose of Mounjaro 3 days ago and tolerating it well without any side effects. She also has been watching what she eats and exercising more. Will increase dose to  for next refill and continue to titrate dose if she continues to tolerate it.     Cora Collum, DO [facility]

## 2023-03-26 NOTE — Patient Instructions (Signed)
It was great seeing you today!  Keep taking your mounjaro- when you finish with your last shot, start taking  a week which I already sent to your pharmacy.  Please check-out at the front desk before leaving the clinic. I'd like to see you back in about a month for follow up, but if you need to be seen earlier than that for any new issues we're happy to fit you in, just give [place] a call!  Visit Reminders: - Stop by the pharmacy to pick up your prescriptions  - Continue to work on your healthy eating habits and incorporating exercise into your daily life.    Feel free to call with any questions or concerns at any time, at [phone].   Take care,  Dr. Cora Collum [facility]

## 2023-03-28 DIAGNOSIS — E669 Obesity, unspecified: Secondary | ICD-10-CM | POA: Insufficient documentation

## 2023-03-28 NOTE — Assessment & Plan Note (Signed)
BMI 47.15. Took first dose of Mounjaro 3 days ago and tolerating it well without any side effects. She also has been watching what she eats and exercising more. Will increase dose to  for next refill and continue to titrate dose if she continues to tolerate it.

## 2023-03-28 NOTE — Assessment & Plan Note (Signed)
>>  ASSESSMENT AND PLAN FOR OBESITY WRITTEN ON 03/28/2023 12:09 PM BY PAIGE, VICTORIA J, DO  BMI 47.15. Took first dose of Mounjaro 3 days ago and tolerating it well without any side effects. She also has been watching what she eats and exercising more. Will increase dose to 5mg  for next refill and continue to titrate dose if she continues to tolerate it.

## 2023-04-06 ENCOUNTER — Other Ambulatory Visit: Payer: Self-pay

## 2023-04-06 MED ORDER — FLUTICASONE PROPIONATE 50 MCG/ACT NA SUSP: 2.0000 | Freq: Every day | NASAL | 6 refills | Status: DC

## 2023-04-10 ENCOUNTER — Other Ambulatory Visit: Payer: Self-pay

## 2023-04-10 MED ORDER — ESCITALOPRAM OXALATE 10 MG PO TABS: 10.0000 mg | ORAL_TABLET | Freq: Every day | ORAL | 0 refills | Status: DC

## 2023-04-17 ENCOUNTER — Telehealth: Payer: Self-pay

## 2023-04-17 ENCOUNTER — Other Ambulatory Visit: Payer: Self-pay | Admitting: Family Medicine

## 2023-04-17 MED ORDER — MOUNJARO 2.5 MG/0.5ML ~~LOC~~ SOAJ: 2.5000 mg | SUBCUTANEOUS | 0 refills | Status: DC

## 2023-04-17 NOTE — Telephone Encounter (Signed)
Patient calls nurse line in regards to [facility] 5mg .   She reports the pharmacy is out of stock and reports a backorder on their end.   Patient advised she can continue the 2.5mg  or call around to other pharmacies to see if they have medication.   Patient would like to continue the 2.5mg  until [facility] gets medication for now.   Please send in 2.5mg  to [facility].

## 2023-04-20 ENCOUNTER — Telehealth: Payer: Self-pay

## 2023-04-20 NOTE — Telephone Encounter (Signed)
Opened in error.   Ryosuke Ericksen C Jolonda Gomm, RN  

## 2023-04-20 NOTE — Telephone Encounter (Signed)
Patient returns call to nurse line. She was able to find 5 mg Mounjaro at [facility] on [address].   She is requesting that we cancel the 2.5 mg at [facility] and send in 5 mg to [facility].   Called and canceled prescription at [facility]. Pended Mounjaro 5 mg to [facility] on [address].   Veronda Prude, RN

## 2023-04-21 ENCOUNTER — Telehealth: Payer: Self-pay | Admitting: Family Medicine

## 2023-04-21 ENCOUNTER — Other Ambulatory Visit: Payer: Self-pay | Admitting: Family Medicine

## 2023-04-21 MED ORDER — MOUNJARO 2.5 MG/0.5ML ~~LOC~~ SOAJ: 2.5000 mg | SUBCUTANEOUS | 0 refills | Status: DC

## 2023-04-21 NOTE — Telephone Encounter (Signed)
Patient came in stating that she talked to the pharmacy today about her [facility] prescription. She was told that she needs to get a pre-authorization for it. Please call if there are any qustions.

## 2023-04-21 NOTE — Telephone Encounter (Signed)
Patient calls nurse line again.   She reports [facility] does not have 5 mg.   Patient will pick up 2.5mg  at [facility].

## 2023-04-22 ENCOUNTER — Telehealth: Payer: Self-pay

## 2023-04-22 ENCOUNTER — Other Ambulatory Visit (HOSPITAL_COMMUNITY): Payer: Self-pay

## 2023-04-22 NOTE — Telephone Encounter (Signed)
Medication not covered by medicaid. Sent new telephone note to Dr. Idalia Needle.

## 2023-04-22 NOTE — Telephone Encounter (Signed)
Rec'd Pa request for patients Mounjaro medication.   Medicaid doesn't cover this medication. Preferred meds are Bydureon, Byetta, Trulicity, Victoza & Ozempic  - for dx type II diabetes only. No injections for weight loss.

## 2023-04-23 ENCOUNTER — Other Ambulatory Visit: Payer: Self-pay

## 2023-04-23 ENCOUNTER — Other Ambulatory Visit (HOSPITAL_COMMUNITY)
Admission: RE | Admit: 2023-04-23 | Discharge: 2023-04-23 | Disposition: A | Discharge status: Home / Self Care | Payer: Medicaid Other | Source: Ambulatory Visit | Attending: Family Medicine | Admitting: Family Medicine

## 2023-04-23 ENCOUNTER — Ambulatory Visit (INDEPENDENT_AMBULATORY_CARE_PROVIDER_SITE_OTHER): Payer: Medicaid Other | Admitting: Family Medicine

## 2023-04-23 ENCOUNTER — Encounter: Payer: Self-pay | Admitting: Family Medicine

## 2023-04-23 VITALS — BP 107/70 | HR 72 | Ht 62.0 in | Wt 250.0 lb

## 2023-04-23 DIAGNOSIS — E88819 Insulin resistance, unspecified: Secondary | ICD-10-CM | POA: Diagnosis not present

## 2023-04-23 DIAGNOSIS — N898 Other specified noninflammatory disorders of vagina: Secondary | ICD-10-CM | POA: Insufficient documentation

## 2023-04-23 DIAGNOSIS — Z113 Encounter for screening for infections with a predominantly sexual mode of transmission: Secondary | ICD-10-CM | POA: Diagnosis not present

## 2023-04-23 LAB — POCT WET PREP (WET MOUNT)
Clue Cells Wet Prep Whiff POC: NEGATIVE
Trichomonas Wet Prep HPF POC: ABSENT

## 2023-04-23 MED ORDER — ZEPBOUND 2.5 MG/0.5ML ~~LOC~~ SOAJ: 2.5000 mg | SUBCUTANEOUS | 0 refills | Status: DC

## 2023-04-23 NOTE — Patient Instructions (Signed)
It was great seeing you today!  We checked for STIs and I will call you if anything is abnormal or will send a mychart message.  Lets see if the Zepbound will be covered by insurance. If not we can switch to Victoza   Please check-out at the front desk before leaving the clinic. I'd like to see you back in 1 month with your son, but if you need to be seen earlier than that for any new issues we're happy to fit you in, just give [place] a call!  Feel free to call with any questions or concerns at any time, at [phone].   Take care,  Dr. Cora Collum [facility]

## 2023-04-23 NOTE — Progress Notes (Signed)
    SUBJECTIVE:   CHIEF COMPLAINT / HPI:   Vaginal Discharge: Patient is a 32 y.o. female presenting with vaginal itching for 3 days. Had intercourse with a new partner this weekend. Denies other vaginal symptoms or urinary symptoms.   She is interested in screening for sexually transmitted infections today. Has Nexplanon in place for contraception.    OBJECTIVE:   BP 107/70   Pulse 72   Ht 5\' 2"  (1.575 m)   Wt 250 lb (113.4 kg)   SpO2 100%   BMI 45.73 kg/m    General: NAD, pleasant, able to participate in exam Respiratory: Normal effort, no obvious respiratory distress Pelvic: VULVA: normal appearing vulva with no masses, tenderness or lesions, VAGINA: Normal appearing vagina with normal color, no lesions, with scant and clear discharge present, CERVIX: No lesions, scant and clear discharge present,  Chaperone Dayshia Ottley present for pelvic exam  ASSESSMENT/PLAN:   No problem-specific Assessment & Plan notes found for this encounter.   Assessment:  32 y.o. female with vaginal itching x 3 days and recent intercourse with new partner. Physical exam unremarkable.  Wet prep performed today shows only bacteria. Patient is interested in STI screening.   Plan: -GC/chlamydia pending -Will check HIV and RPR  Obesity BMI 45.73. Insurance will not cover Mounjaro, trying Zepbound instead. Will send in for 2.5mg  weekly   Cora Collum, DO [facility]

## 2023-04-24 ENCOUNTER — Telehealth: Payer: Self-pay

## 2023-04-24 LAB — CERVICOVAGINAL ANCILLARY ONLY
Bacterial Vaginitis (gardnerella): NEGATIVE
Candida Glabrata: NEGATIVE
Candida Vaginitis: POSITIVE — AB
Chlamydia: NEGATIVE
Comment: NEGATIVE
Comment: NEGATIVE
Comment: NEGATIVE
Comment: NEGATIVE
Comment: NEGATIVE
Comment: NORMAL
Neisseria Gonorrhea: NEGATIVE
Trichomonas: NEGATIVE

## 2023-04-24 NOTE — Telephone Encounter (Signed)
Patient calls nurse line reporting Zepbound is not being covered by medicaid.   She is requesting a script for Victoza.   Will forward to PCP.

## 2023-04-25 ENCOUNTER — Other Ambulatory Visit: Payer: Self-pay | Admitting: Family Medicine

## 2023-04-25 MED ORDER — FLUCONAZOLE 150 MG PO TABS: 150.0000 mg | ORAL_TABLET | Freq: Once | ORAL | 0 refills | Status: AC

## 2023-04-25 MED ORDER — LIRAGLUTIDE 18 MG/3ML ~~LOC~~ SOPN: 1.2000 mg | PEN_INJECTOR | Freq: Every day | SUBCUTANEOUS | 0 refills | Status: DC

## 2023-04-27 IMAGING — US US MFM OB FOLLOW-UP
1 series · 13 of 28 positions shown · non-contrast
Comparison: none

[Series 1: us mfm ob follow-up · 50 acquisitions, 13 frames shown]
[im 2/50]
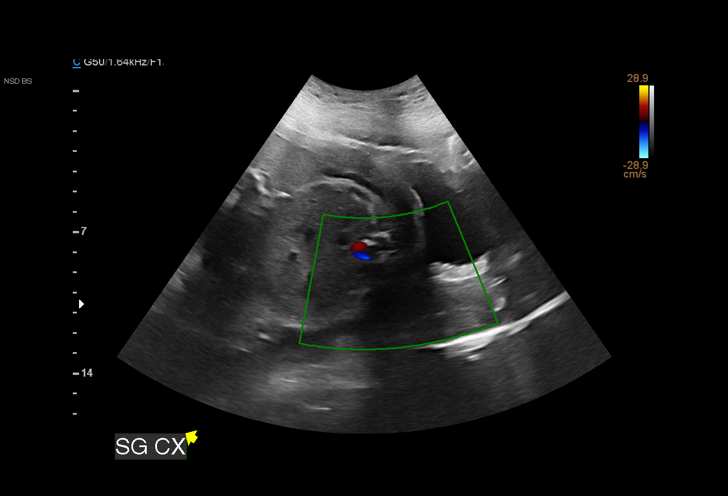
[im 6/50]
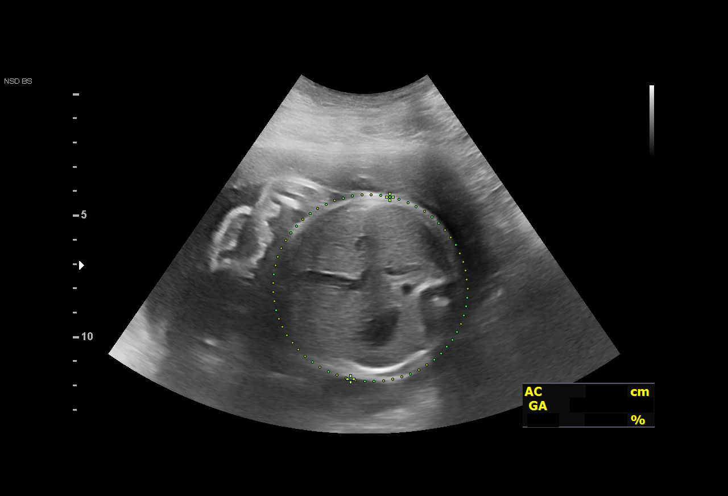
[im 10/50]
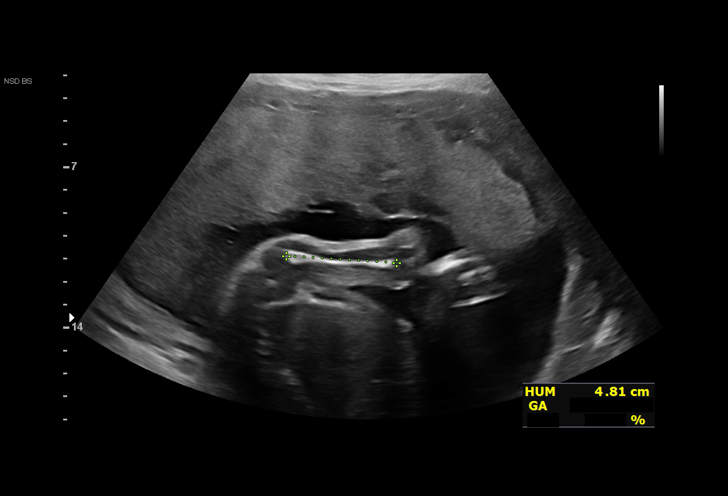
[im 13/50]
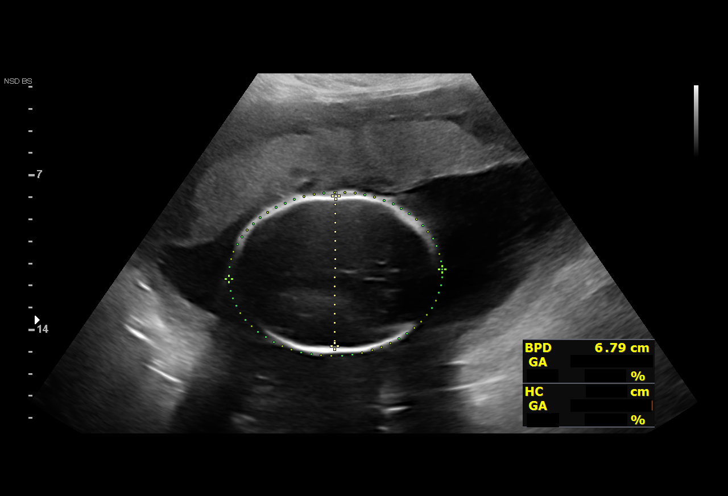
[im 17/50]
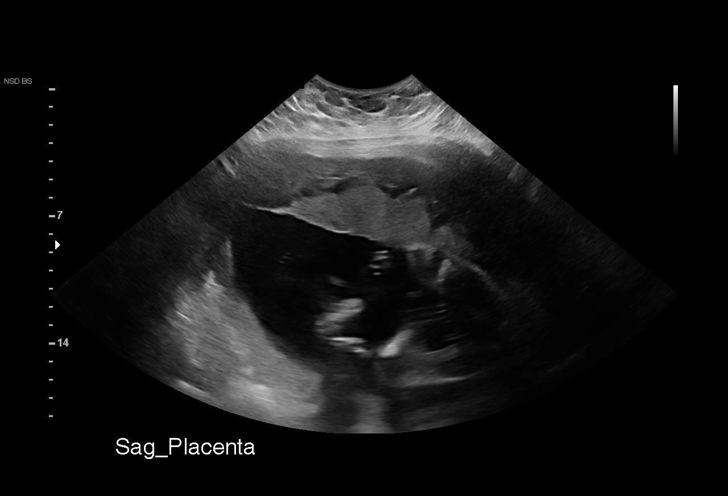
[im 20/50]
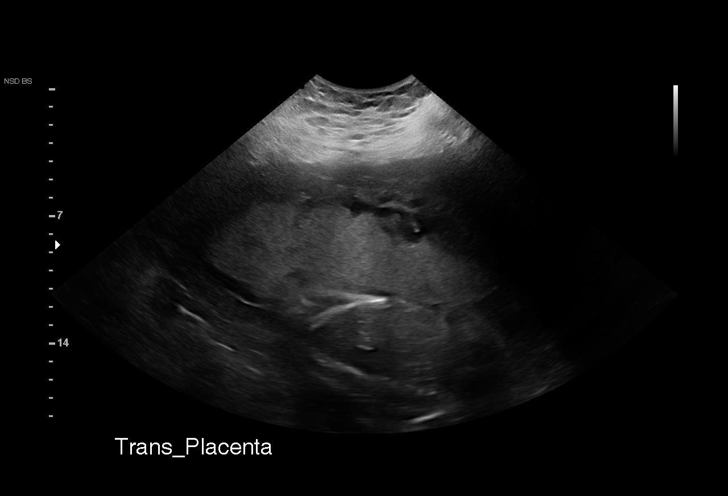
[im 26/50]
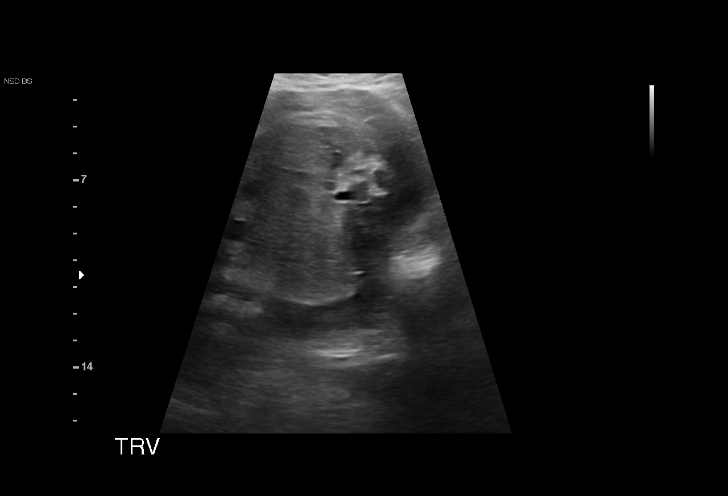
[im 30/50]
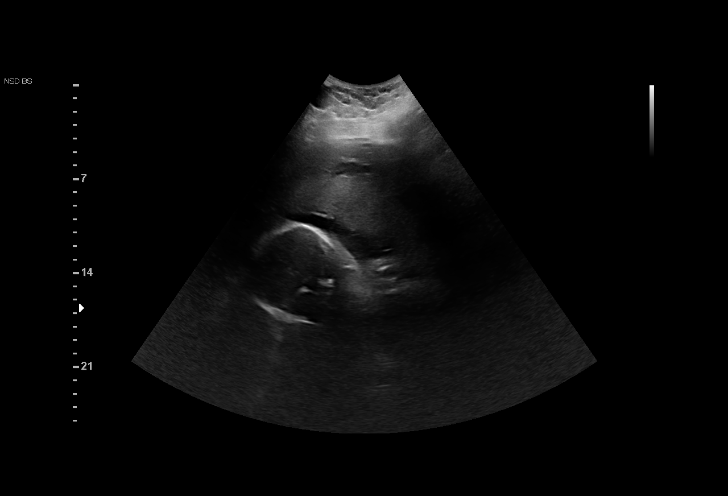
[im 33/50]
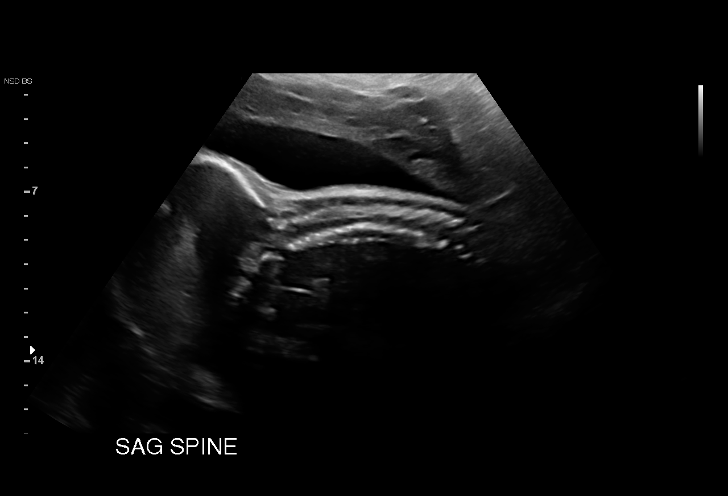
[im 37/50]
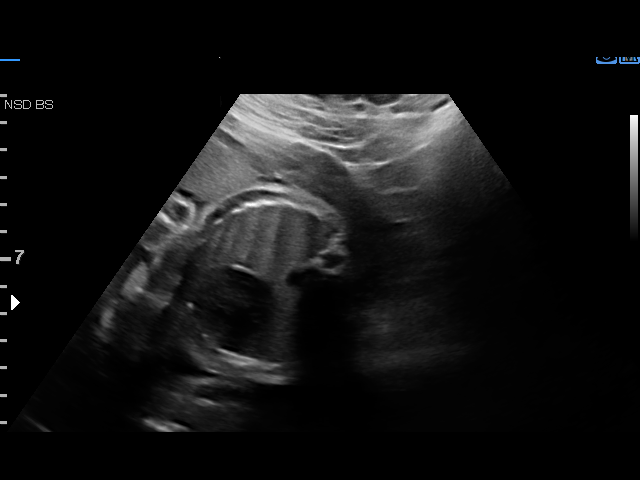
[im 40/50]
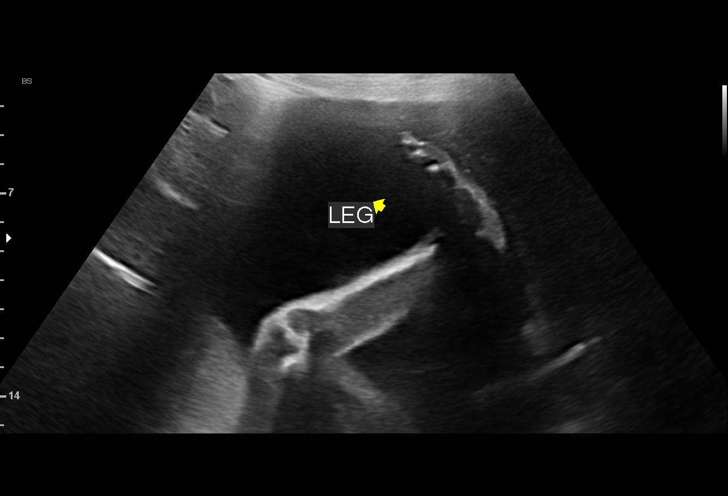
[im 44/50]
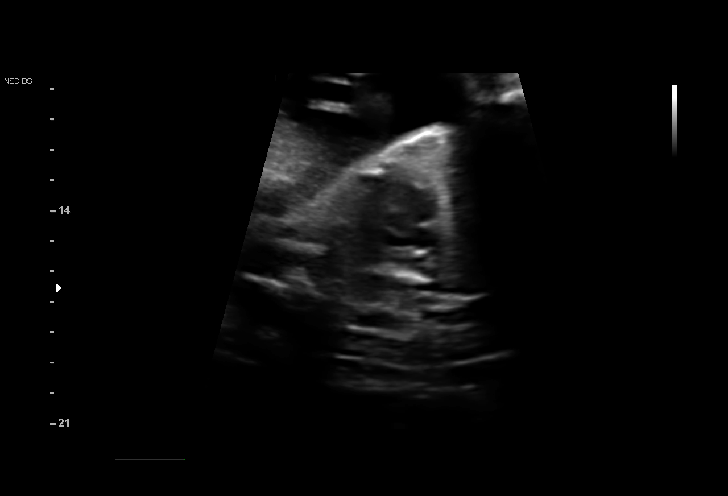
[im 48/50]
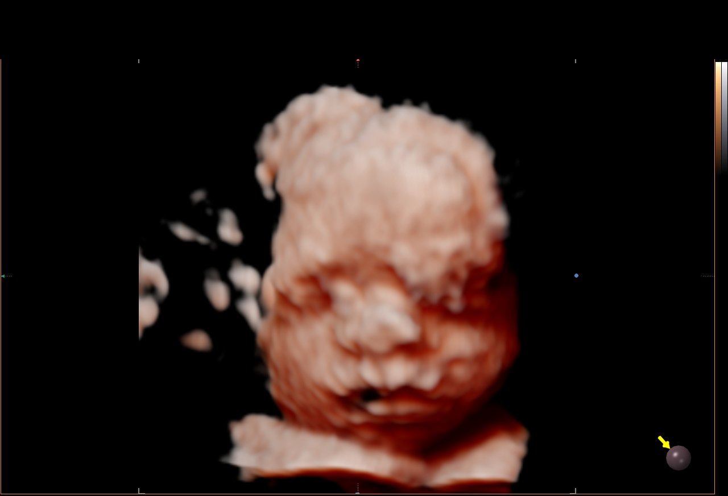

[13 of 28 positions shown; findings below may reference images not displayed]

Indications

 Thyroid disease in pregnancy                   O99.280,
 27 weeks gestation of pregnancy
 Encounter for antenatal screening for
 malformations
 History of cesarean delivery, currently
 pregnant
 Anxiety during pregnancy, second trimester     O99.342,
 Anemia during pregnancy in second trimester
 Obesity complicating pregnancy, second
 trimester (BMI 51)
 LR NIPS/ Negative Horizon/ Negative AFP
Fetal Evaluation

 Num Of Fetuses:         1
 Cardiac Activity:       Observed
 Presentation:           Breech
 Placenta:               Anterior
 P. Cord Insertion:      Previously seen as normal

 Amniotic Fluid
 AFI FV:      Within normal limits

                             Largest Pocket(cm)

Biometry
 BPD:      68.7  mm     G. Age:  27w 4d         40  %    CI:        66.98   %    70 - 86
                                                         FL/HC:      18.3   %    18.8 -
 HC:      268.9  mm     G. Age:  29w 2d         76  %    HC/AC:      1.12        1.05 -
 AC:      240.1  mm     G. Age:  28w 2d         65  %    FL/BPD:     71.8   %    71 - 87
 FL:       49.3  mm     G. Age:  26w 4d         12  %    FL/AC:      20.5   %    20 - 24
 HUM:      47.8  mm     G. Age:  28w 0d         57  %

 Est. FW:    3313  gm      2 lb 8 oz     45  %
OB History

 Blood Type:   AB+
 Gravidity:    2         Term:   1        Prem:   0        SAB:   0
 TOP:          0       Ectopic:  0        Living: 1
Gestational Age

 LMP:           27w 4d        Date:  12/29/20                 EDD:   10/05/21
 U/S Today:     28w 0d                                        EDD:   10/02/21
 Best:          27w 4d     Det. By:  LMP  (12/29/20)          EDD:   10/05/21
Anatomy

 Cranium:               Appears normal         LVOT:                   Appears normal
 Cavum:                 Previously seen        Aortic Arch:            Not well visualized
 Ventricles:            Previously seen        Ductal Arch:            Previously seen
 Choroid Plexus:        Previously seen        Diaphragm:              Previously seen
 Cerebellum:            Previously seen        Stomach:                Appears normal, left
                                                                       sided
 Posterior Fossa:       Previously seen        Abdomen:                Appears normal
 Nuchal Fold:           Not applicable (>20    Abdominal Wall:         Previously seen
                        wks GA)
 Face:                  Orbits and profile     Cord Vessels:           Previously seen
                        previously seen
 Lips:                  Previously seen        Kidneys:                Appear normal
 Palate:                Not well visualized    Bladder:                Appears normal
 Thoracic:              Appears normal         Spine:                  Limited views
                                                                       appear normal
 Heart:                 Appears normal         Upper Extremities:      Previously seen
                        (4CH, axis, and
                        situs)
 RVOT:                  Appears normal         Lower Extremities:      Previously seen

 Other:  Male gender previously seen. Heels previously seen. Limited 3VV
         and 3VTV previously visualized. Technically difficult due to maternal
         habitus and fetal position.
Cervix Uterus Adnexa

 Cervix
 Not visualized (advanced GA >74wks)

 Right Ovary
 Previously seen

 Left Ovary
 Previously seen.
Impression

 Follow up growth due to elevated BMI with suboptimal views
 of the fetal anatomy
 Normal interval growth with measurements consistent with
 dates
 Good fetal movement and amniotic fluid volume

 Suboptimal views again were limited due to fetal position and
 elevated BMI.
Recommendations

 Follow up growth 4-6 weeks.

## 2023-04-28 LAB — RPR W/REFLEX TO TREPSURE: RPR: NONREACTIVE

## 2023-04-28 LAB — HIV ANTIBODY (ROUTINE TESTING W REFLEX): HIV Screen 4th Generation wRfx: NONREACTIVE

## 2023-04-28 LAB — T PALLIDUM ANTIBODY, EIA: T pallidum Antibody, EIA: NEGATIVE

## 2023-04-29 ENCOUNTER — Other Ambulatory Visit: Payer: Self-pay | Admitting: Adult Health

## 2023-05-05 ENCOUNTER — Ambulatory Visit (HOSPITAL_COMMUNITY)
Admission: EM | Admit: 2023-05-05 | Discharge: 2023-05-05 | Disposition: A | Discharge status: Home / Self Care | Payer: Medicaid Other

## 2023-05-05 ENCOUNTER — Ambulatory Visit (INDEPENDENT_AMBULATORY_CARE_PROVIDER_SITE_OTHER): Payer: Medicaid Other | Admitting: Student

## 2023-05-05 VITALS — BP 112/62 | HR 71 | Ht 62.0 in | Wt 244.8 lb

## 2023-05-05 DIAGNOSIS — J4521 Mild intermittent asthma with (acute) exacerbation: Secondary | ICD-10-CM | POA: Diagnosis present

## 2023-05-05 MED ORDER — FLUTICASONE PROPIONATE HFA 110 MCG/ACT IN AERO: 2.0000 | INHALATION_SPRAY | Freq: Two times a day (BID) | RESPIRATORY_TRACT | 5 refills | Status: DC

## 2023-05-05 NOTE — Assessment & Plan Note (Addendum)
Mild intermittent asthma with acute exacerbation secondary to viral illness.  Advised scheduled Flovent use with as needed albuterol.  Vitals and physical exam appropriate today not requiring further acute intervention.  Advised Afrin temporarily for nasal congestion.

## 2023-05-05 NOTE — Progress Notes (Signed)
  SUBJECTIVE:   CHIEF COMPLAINT / HPI:   Sore throat when swallowing, intermittent dry cough, ear pain, began yesterday. These symptoms are bothering her breathing - shortness of breath. Denies fever, abdominal pain, diarrhea.  She is not consistently using the albuterol inhaler or her Flovent inhaler.  She notes that she took her albuterol inhaler this morning which helped her shortness of breath.  She feels tight in her chest.  PERTINENT  PMH / PSH: Allergic rhinitis, asthma, GAD  Patient Care Team: Cora Collum, DO as PCP - General (Family Medicine) Jake Bathe, MD as PCP - Cardiology (Cardiology) OBJECTIVE:  BP 112/62   Pulse 71   Ht 5\' 2"  (1.575 m)   Wt 244 lb 12.8 oz (111 kg)   SpO2 98%   BMI 44.77 kg/m  Physical Exam Constitutional:      General: She is not in acute distress.    Appearance: She is well-developed.  HENT:     Right Ear: Tympanic membrane normal.     Left Ear: Tympanic membrane normal.     Nose: Congestion present.     Mouth/Throat:     Mouth: Mucous membranes are moist.     Pharynx: Oropharynx is clear.     Tonsils: No tonsillar exudate.  Cardiovascular:     Rate and Rhythm: Normal rate and regular rhythm.     Heart sounds: Normal heart sounds.  Pulmonary:     Effort: Pulmonary effort is normal.     Breath sounds: Normal breath sounds.  Abdominal:     General: Bowel sounds are normal.     Palpations: Abdomen is soft.     Tenderness: There is no abdominal tenderness.  Musculoskeletal:     Cervical back: Neck supple.  Lymphadenopathy:     Cervical: No cervical adenopathy.  Neurological:     General: No focal deficit present.     Mental Status: She is alert.    ASSESSMENT/PLAN:  Mild intermittent asthma with acute exacerbation Assessment & Plan: Mild intermittent asthma with acute exacerbation secondary to viral illness.  Advised scheduled Flovent use with as needed albuterol.  Vitals and physical exam appropriate today not requiring  further acute intervention.  Advised Afrin temporarily for nasal congestion.  Orders: -     Fluticasone Propionate HFA; Inhale 2 puffs into the lungs 2 (two) times daily.  Dispense: 1 each; Refill: 5  Return if symptoms worsen or fail to improve. [name], DO 05/05/2023, 2:07 PM PGY-2, [facility]

## 2023-05-05 NOTE — Patient Instructions (Addendum)
It was great to see you today! Thank you for choosing [facility] for your primary care. Veronica [name] was seen for sore throat and shortness of breath.  Today we addressed: I suspect you are having a mild asthma exacerbation related to a viral illness.  Taking your Flovent inhaler as prescribed with albuterol inhaler as needed will be helpful while you are going through this viral illness.  I have submitted a new prescription for you.  You may also use a decongestant such as Afrin for a couple days.  Please do not use it longer than a couple days.  If you haven't already, sign up for My Chart to have easy access to your labs results, and communication with your primary care physician.  I recommend that you always bring your medications to each appointment as this makes it easy to ensure you are on the correct medications and helps [place] not miss refills when you need them. Call the clinic at [phone] if your symptoms worsen or you have any concerns.  You should return to our clinic Return if symptoms worsen or fail to improve. Please arrive 15 minutes before your appointment to ensure smooth check in process.  We appreciate your efforts in making this happen.  Thank you for allowing me to participate in your care, [name], DO 05/05/2023, 10:35 AM PGY-2, [facility]

## 2023-05-09 ENCOUNTER — Other Ambulatory Visit: Payer: Self-pay

## 2023-05-09 ENCOUNTER — Encounter (HOSPITAL_COMMUNITY): Payer: Self-pay

## 2023-05-09 ENCOUNTER — Emergency Department (HOSPITAL_COMMUNITY): Payer: Medicaid Other

## 2023-05-09 ENCOUNTER — Emergency Department (HOSPITAL_COMMUNITY)
Admission: EM | Admit: 2023-05-09 | Discharge: 2023-05-09 | Disposition: A | Discharge status: Home / Self Care | Payer: Medicaid Other | Attending: Emergency Medicine | Admitting: Emergency Medicine

## 2023-05-09 DIAGNOSIS — N939 Abnormal uterine and vaginal bleeding, unspecified: Secondary | ICD-10-CM | POA: Insufficient documentation

## 2023-05-09 DIAGNOSIS — R102 Pelvic and perineal pain: Secondary | ICD-10-CM | POA: Diagnosis present

## 2023-05-09 LAB — URINALYSIS, ROUTINE W REFLEX MICROSCOPIC
Bilirubin Urine: NEGATIVE
Glucose, UA: NEGATIVE mg/dL
Ketones, ur: NEGATIVE mg/dL
Leukocytes,Ua: NEGATIVE
Nitrite: NEGATIVE
Protein, ur: 30 mg/dL — AB
Specific Gravity, Urine: 1.004 — ABNORMAL LOW (ref 1.005–1.030)
pH: 6 (ref 5.0–8.0)

## 2023-05-09 LAB — CBC WITH DIFFERENTIAL/PLATELET
Abs Immature Granulocytes: 0.02 10*3/uL (ref 0.00–0.07)
Basophils Absolute: 0 10*3/uL (ref 0.0–0.1)
Basophils Relative: 1 %
Eosinophils Absolute: 0.1 10*3/uL (ref 0.0–0.5)
Eosinophils Relative: 2 %
HCT: 37.8 % (ref 36.0–46.0)
Hemoglobin: 12.5 g/dL (ref 12.0–15.0)
Immature Granulocytes: 0 %
Lymphocytes Relative: 37 %
Lymphs Abs: 2.2 10*3/uL (ref 0.7–4.0)
MCH: 28.3 pg (ref 26.0–34.0)
MCHC: 33.1 g/dL (ref 30.0–36.0)
MCV: 85.7 fL (ref 80.0–100.0)
Monocytes Absolute: 0.4 10*3/uL (ref 0.1–1.0)
Monocytes Relative: 7 %
Neutro Abs: 3.2 10*3/uL (ref 1.7–7.7)
Neutrophils Relative %: 53 %
Platelets: 313 10*3/uL (ref 150–400)
RBC: 4.41 MIL/uL (ref 3.87–5.11)
RDW: 13.8 % (ref 11.5–15.5)
WBC: 6 10*3/uL (ref 4.0–10.5)
nRBC: 0 % (ref 0.0–0.2)

## 2023-05-09 LAB — WET PREP, GENITAL
Clue Cells Wet Prep HPF POC: NONE SEEN
Sperm: NONE SEEN
Trich, Wet Prep: NONE SEEN
WBC, Wet Prep HPF POC: <10 (ref <10)
Yeast Wet Prep HPF POC: NONE SEEN

## 2023-05-09 LAB — COMPREHENSIVE METABOLIC PANEL
ALT: 14 U/L (ref 0–44)
AST: 10 U/L — ABNORMAL LOW (ref 15–41)
Albumin: 3.7 g/dL (ref 3.5–5.0)
Alkaline Phosphatase: 55 U/L (ref 38–126)
Anion gap: 8 (ref 5–15)
BUN: 7 mg/dL (ref 6–20)
CO2: 23 mmol/L (ref 22–32)
Calcium: 8.8 mg/dL — ABNORMAL LOW (ref 8.9–10.3)
Chloride: 106 mmol/L (ref 98–111)
Creatinine, Ser: 0.97 mg/dL (ref 0.44–1.00)
GFR, Estimated: >60 mL/min (ref >60)
Glucose, Bld: 84 mg/dL (ref 70–99)
Potassium: 3.5 mmol/L (ref 3.5–5.1)
Sodium: 137 mmol/L (ref 135–145)
Total Bilirubin: 0.6 mg/dL (ref 0.3–1.2)
Total Protein: 7.1 g/dL (ref 6.5–8.1)

## 2023-05-09 LAB — I-STAT BETA HCG BLOOD, ED (MC, WL, AP ONLY): I-stat hCG, quantitative: <5 m[IU]/mL (ref <5)

## 2023-05-09 LAB — LIPASE, BLOOD: Lipase: 29 U/L (ref 11–51)

## 2023-05-09 LAB — HIV ANTIBODY (ROUTINE TESTING W REFLEX): HIV Screen 4th Generation wRfx: NONREACTIVE

## 2023-05-09 NOTE — ED Triage Notes (Signed)
Pt present to ED with c/o RLQ pain, vaginal bleeding, heavy in nature. Onset of symptoms began yesterday. Pt states to taking Victoza injections for weight loss.

## 2023-05-09 NOTE — Discharge Instructions (Signed)
Please follow-up with the women's health clinic regarding recent symptoms and ER visit.  Today your labs and ultrasound were all reassuring.  Please follow-up on the MyChart app regarding your syphilis, HIV, gonorrhea chlamydia results.  Symptoms worsen or change please return to ER.

## 2023-05-09 NOTE — ED Provider Notes (Signed)
[place] EMERGENCY DEPARTMENT AT [facility] Provider Note   CSN: [id number] Arrival date & time: 05/09/23  2130     History  Chief Complaint  Patient presents with   Abdominal Pain    Veronica [name] is a 32 y.o. female history of GAD, hypothyroidism, anemia presented with vaginal bleeding that began yesterday.  Patient states that Veronica [name] has gone through 3 pads since yesterday and that her last period was at the beginning of the month and that this would be early for her.  Patient states that Veronica [name] woke up this morning with right-sided pelvic pain.  Patient denies blood clots or being on blood thinners.  Patient states Veronica [name] is able to urinate and have bowel movements without issue.  Patient denies any fevers, nausea/vomiting, abdominal pain, vaginal discharge.  Patient states Veronica [name] would like to be tested for STDs as well as several send HIV as well.  Patient currently has Nexplanon in place.  Triage note states that patient has back pain however patient denied back pain with me  Home Medications Prior to Admission medications   Medication Sig Start Date End Date Taking? Authorizing Provider  albuterol (VENTOLIN HFA) 108 (90 Base) MCG/ACT inhaler INHALE 2 PUFFS BY MOUTH EVERY 4 HOURS AS NEEDED FOR WHEEZING OR SHORTNESS OF BREATH (COUGH). 01/30/23   Raspet, Noberto Retort, [name]  cetirizine (ZYRTEC) 10 MG tablet Take 10 mg by mouth daily.    [provider]  escitalopram (LEXAPRO) 10 MG tablet Take 1 tablet (10 mg total) by mouth daily. 04/10/23   Cora Collum, DO  etonogestrel (NEXPLANON) 68 MG IMPL implant 1 each by Subdermal route once.    [provider]  famotidine (PEPCID) 20 MG tablet Take 1 tablet by mouth once daily 02/17/23   Cora Collum, DO  ferrous sulfate 325 (65 FE) MG tablet Take 1 tablet (325 mg total) by mouth every other day. 02/13/23   Cora Collum, DO  fluticasone (FLONASE) 50 MCG/ACT nasal spray Place 2 sprays into both nostrils daily. 04/06/23    Cora Collum, DO  fluticasone (FLOVENT HFA) 110 MCG/ACT inhaler Inhale 2 puffs into the lungs 2 (two) times daily. 05/05/23   [name], DO  hydrOXYzine (ATARAX) 25 MG tablet Take 1 tablet (25 mg total) by mouth 3 (three) times daily as needed for anxiety. 02/13/23   Cora Collum, DO  liraglutide (VICTOZA) 18 MG/3ML SOPN Inject 1.2 mg into the skin daily. 04/25/23   Cora Collum, DO  Multiple Vitamins-Minerals (ONE-A-DAY WOMENS PO) Take 1 tablet by mouth daily.    [provider]  pantoprazole (PROTONIX) 40 MG tablet Take 1 tablet by mouth once daily 04/29/23   Parrett, Tammy S, NP  polyethylene glycol (MIRALAX / GLYCOLAX) 17 g packet Take 17 g by mouth daily. 06/01/21   Gerrit Heck, CNM      Allergies    Morphine, Ibuprofen, and Morphine and codeine    Review of Systems   Review of Systems See HPI Physical Exam Updated Vital Signs BP 119/79   Pulse 70   Temp 98.3 F (36.8 C)   Resp 18   Ht 5\' 2"  (1.575 m)   Wt 112 kg   SpO2 95%   BMI 45.18 kg/m  Physical Exam Exam conducted with a chaperone present.  Constitutional:      General: Veronica [name] is not in acute distress. Cardiovascular:     Pulses: Normal pulses.  Pulmonary:     Effort: Pulmonary  effort is normal.  Abdominal:     Palpations: Abdomen is soft.     Tenderness: There is no abdominal tenderness. There is no guarding or rebound.  Genitourinary:    Exam position: Lithotomy position.     Labia:        Right: No rash, tenderness, lesion or injury.        Left: No rash, tenderness, lesion or injury.      Vagina: Bleeding present.     Cervix: Normal.     Uterus: Normal.      Adnexa: Right adnexa normal and left adnexa normal.     Comments: Chaperone: Cindy, RN Skin:    General: Skin is warm and dry.     Comments: No overlying skin color changes  Neurological:     Mental Status: Veronica [name] is alert.  Psychiatric:        Mood and Affect: Mood normal.     ED Results / Procedures / Treatments    Labs (all labs ordered are listed, but only abnormal results are displayed) Labs Reviewed  COMPREHENSIVE METABOLIC PANEL - Abnormal; Notable for the following components:      Result Value   Calcium 8.8 (*)    AST 10 (*)    All other components within normal limits  URINALYSIS, ROUTINE W REFLEX MICROSCOPIC - Abnormal; Notable for the following components:   Color, Urine STRAW (*)    APPearance CLOUDY (*)    Specific Gravity, Urine 1.004 (*)    Hgb urine dipstick LARGE (*)    Protein, ur 30 (*)    Bacteria, UA FEW (*)    All other components within normal limits  WET PREP, GENITAL  CBC WITH DIFFERENTIAL/PLATELET  LIPASE, BLOOD  HIV ANTIBODY (ROUTINE TESTING W REFLEX)  RPR  I-STAT BETA HCG BLOOD, ED ([facility], [facility], [facility] ONLY)  GC/CHLAMYDIA PROBE AMP ([facility]) NOT AT [facility]    EKG None  Radiology US Pelvis Complete  Result Date: 05/09/2023 CLINICAL DATA:  Vaginal bleeding.  Right groin pain. EXAM: TRANSABDOMINAL ULTRASOUND OF PELVIS DOPPLER ULTRASOUND OF OVARIES TECHNIQUE: Transabdominal ultrasound examination of the pelvis was performed including evaluation of the uterus, ovaries, adnexal regions, and pelvic cul-de-sac. Color and duplex Doppler ultrasound was utilized to evaluate blood flow to the ovaries. COMPARISON:  CT, 03/13/2022. FINDINGS: Uterus Measurements: 9.1 x 3.8 x 4.1 cm = volume: 76 mL. No fibroids or other mass visualized. Endometrium Thickness: 2 mm.  No focal abnormality visualized. Right ovary Measurements: 2.1 x 1.5 x 1.7 cm = volume: 2.8 mL. Normal appearance/no adnexal mass. Left ovary Measurements: 2.0 x 1.1 x 2.0 cm = volume: 2.0 mL. Normal appearance/no adnexal mass. Pulsed Doppler evaluation demonstrates normal low-resistance arterial and venous waveforms in both ovaries. Other: None. IMPRESSION: 1. Normal transabdominal pelvic imaging. Normal ovarian Doppler analysis. Electronically Signed   By: Amie Portland M.D.   On: 05/09/2023 11:06   [place] Art/Ven Flow Abd Pelv  Doppler  Result Date: 05/09/2023 CLINICAL DATA:  Vaginal bleeding.  Right groin pain. EXAM: TRANSABDOMINAL ULTRASOUND OF PELVIS DOPPLER ULTRASOUND OF OVARIES TECHNIQUE: Transabdominal ultrasound examination of the pelvis was performed including evaluation of the uterus, ovaries, adnexal regions, and pelvic cul-de-sac. Color and duplex Doppler ultrasound was utilized to evaluate blood flow to the ovaries. COMPARISON:  CT, 03/13/2022. FINDINGS: Uterus Measurements: 9.1 x 3.8 x 4.1 cm = volume: 76 mL. No fibroids or other mass visualized. Endometrium Thickness: 2 mm.  No focal abnormality visualized. Right ovary Measurements: 2.1 x 1.5 x  1.7 cm = volume: 2.8 mL. Normal appearance/no adnexal mass. Left ovary Measurements: 2.0 x 1.1 x 2.0 cm = volume: 2.0 mL. Normal appearance/no adnexal mass. Pulsed Doppler evaluation demonstrates normal low-resistance arterial and venous waveforms in both ovaries. Other: None. IMPRESSION: 1. Normal transabdominal pelvic imaging. Normal ovarian Doppler analysis. Electronically Signed   By: Amie Portland M.D.   On: 05/09/2023 11:06    Procedures Procedures    Medications Ordered in ED Medications - No data to display  ED Course/ Medical Decision Making/ A&P                             Medical Decision Making Amount and/or Complexity of Data Reviewed Labs: ordered. Radiology: ordered.   Jinan R Stankey 32 y.o. presented today for vaginal bleeding. Working DDx that I considered at this time includes, but not limited to, UTI, menstrual cycle, AUB, trauma, PID, endometrial CA, hypothyroidism, FB, STI, hemorrhagic cyst, ovarian torsion.  LMP: 3 weeks ago  R/o DDx: UTI, AUB, trauma, PID, endometrial cancer, hypothyroidism, FB, STI, hemorrhagic cyst, ovarian torsion: These are considered less likely due to history of present illness and physical exam findings  Review of prior external notes: 04/27/2023 progress Notes  Unique Tests and My Interpretation:  CBC:  Unremarkable UA: Large hemoglobin I-STAT beta-hCG: Negative CMP: Unremarkable Lipase: Negative Type & Screen: Pending G/C: Pending Wet Mount: Negative RPR: Pending HIV: Pending Pelvic U/S: No acute pelvic abnormalities  Discussion with Independent Historian: None  Discussion of Management of Tests: None  Risk: Low: based on diagnostic testing/clinical impression and treatment plan  Risk Stratification Score: None  Plan: Patient presented for vaginal bleeding. On exam patient was in no acute distress and stable vitals.  Patient's physical exam was also unremarkable as patient was nontender to her abdomen and did not have any external pelvic pain to palpation.  Labs and ultrasound were ordered to further evaluate and patient would not ultrasound for was able to do a pelvic exam.  Patient stated that Veronica [name] also wanted to be tested for STDs including syphilis and HIV.  Patient's pelvic exam did show blood in the vaginal vault however rest of her blood exam was unremarkable.  Patient's ultrasound is reassuring.  Patient's wet mount came back negative.  I spoke to the patient how Veronica [name] will need to follow-up on the MyChart app regarding the rest of her STD tests.  Since has been 3 weeks since patient's last period this could be the patient having an early period.  Patient be referred to the women's health clinic for further evaluation and long-term management as Veronica [name] does not see GYN.  This plan was relayed to the patient and Veronica [name] verbalized acceptance of this plan.  Patient was given return precautions. Patient stable for discharge at this time.  Patient verbalized understanding of plan.         Final Clinical Impression(s) / ED Diagnoses Final diagnoses:  Vaginal bleeding    Rx / DC Orders ED Discharge Orders     None         Remi Deter 05/09/23 1305    Gwyneth Sprout, MD 05/09/23 2052

## 2023-05-10 LAB — RPR: RPR Ser Ql: NONREACTIVE

## 2023-05-11 LAB — GC/CHLAMYDIA PROBE AMP (~~LOC~~) NOT AT ARMC
Chlamydia: NEGATIVE
Comment: NEGATIVE
Comment: NORMAL
Neisseria Gonorrhea: NEGATIVE

## 2023-05-12 ENCOUNTER — Encounter (HOSPITAL_COMMUNITY): Payer: Self-pay | Admitting: Emergency Medicine

## 2023-05-12 ENCOUNTER — Other Ambulatory Visit: Payer: Self-pay

## 2023-05-12 ENCOUNTER — Ambulatory Visit: Payer: Medicaid Other | Admitting: Family Medicine

## 2023-05-12 ENCOUNTER — Emergency Department (HOSPITAL_COMMUNITY)
Admission: EM | Admit: 2023-05-12 | Discharge: 2023-05-13 | Disposition: A | Discharge status: Home / Self Care | Payer: Medicaid Other | Attending: Emergency Medicine | Admitting: Emergency Medicine

## 2023-05-12 ENCOUNTER — Emergency Department (HOSPITAL_COMMUNITY): Payer: Medicaid Other

## 2023-05-12 DIAGNOSIS — J45909 Unspecified asthma, uncomplicated: Secondary | ICD-10-CM | POA: Insufficient documentation

## 2023-05-12 DIAGNOSIS — R0602 Shortness of breath: Secondary | ICD-10-CM | POA: Diagnosis not present

## 2023-05-12 DIAGNOSIS — R059 Cough, unspecified: Secondary | ICD-10-CM | POA: Insufficient documentation

## 2023-05-12 DIAGNOSIS — Z1152 Encounter for screening for COVID-19: Secondary | ICD-10-CM | POA: Insufficient documentation

## 2023-05-12 DIAGNOSIS — R0600 Dyspnea, unspecified: Secondary | ICD-10-CM | POA: Diagnosis not present

## 2023-05-12 LAB — BASIC METABOLIC PANEL
Anion gap: 9 (ref 5–15)
BUN: 9 mg/dL (ref 6–20)
CO2: 24 mmol/L (ref 22–32)
Calcium: 8.9 mg/dL (ref 8.9–10.3)
Chloride: 105 mmol/L (ref 98–111)
Creatinine, Ser: 1.12 mg/dL — ABNORMAL HIGH (ref 0.44–1.00)
GFR, Estimated: >60 mL/min (ref >60)
Glucose, Bld: 92 mg/dL (ref 70–99)
Potassium: 3.6 mmol/L (ref 3.5–5.1)
Sodium: 138 mmol/L (ref 135–145)

## 2023-05-12 LAB — CBC WITH DIFFERENTIAL/PLATELET
Abs Immature Granulocytes: 0.01 10*3/uL (ref 0.00–0.07)
Basophils Absolute: 0 10*3/uL (ref 0.0–0.1)
Basophils Relative: 0 %
Eosinophils Absolute: 0.1 10*3/uL (ref 0.0–0.5)
Eosinophils Relative: 1 %
HCT: 38.5 % (ref 36.0–46.0)
Hemoglobin: 12.5 g/dL (ref 12.0–15.0)
Immature Granulocytes: 0 %
Lymphocytes Relative: 54 %
Lymphs Abs: 3.8 10*3/uL (ref 0.7–4.0)
MCH: 27.5 pg (ref 26.0–34.0)
MCHC: 32.5 g/dL (ref 30.0–36.0)
MCV: 84.6 fL (ref 80.0–100.0)
Monocytes Absolute: 0.6 10*3/uL (ref 0.1–1.0)
Monocytes Relative: 9 %
Neutro Abs: 2.5 10*3/uL (ref 1.7–7.7)
Neutrophils Relative %: 36 %
Platelets: 349 10*3/uL (ref 150–400)
RBC: 4.55 MIL/uL (ref 3.87–5.11)
RDW: 13.9 % (ref 11.5–15.5)
WBC: 7 10*3/uL (ref 4.0–10.5)
nRBC: 0 % (ref 0.0–0.2)

## 2023-05-12 LAB — RESP PANEL BY RT-PCR (RSV, FLU A&B, COVID)  RVPGX2
Influenza A by PCR: NEGATIVE
Influenza B by PCR: NEGATIVE
Resp Syncytial Virus by PCR: NEGATIVE
SARS Coronavirus 2 by RT PCR: NEGATIVE

## 2023-05-12 LAB — BRAIN NATRIURETIC PEPTIDE: B Natriuretic Peptide: 5.2 pg/mL (ref 0.0–100.0)

## 2023-05-12 LAB — TROPONIN I (HIGH SENSITIVITY): Troponin I (High Sensitivity): <2 ng/L (ref <18)

## 2023-05-12 LAB — MAGNESIUM: Magnesium: 2 mg/dL (ref 1.7–2.4)

## 2023-05-12 NOTE — ED Provider Triage Note (Signed)
Emergency Medicine Provider Triage Evaluation Note  Veronica [name] , a 32 y.o. female  was evaluated in triage.  Pt complains of shortness of breath since this morning.  Mostly exertional.  No associated chest pain.  Does endorse some cough but denies other URI symptoms.  No leg swelling.  Endorses two-pillow orthopnea but is unsure if this is chronic.Marland Kitchen  Review of Systems  Positive: As above Negative: As above  Physical Exam  BP 113/84 (BP Location: Right Arm)   Pulse 95   Temp 99.1 F (37.3 C) (Oral)   Resp 18   SpO2 100%  Gen:   Awake, no distress   Resp:  Normal effort  MSK:   Moves extremities without difficulty  Other:    Medical Decision Making  Medically screening exam initiated at 8:12 PM.  Appropriate orders placed.  Veronica [name] was informed that the remainder of the evaluation will be completed by another provider, this initial triage assessment does not replace that evaluation, and the importance of remaining in the ED until their evaluation is complete.     Marita Kansas, [name] 05/12/23 2013

## 2023-05-12 NOTE — ED Triage Notes (Signed)
Pt arrives via POV c/o SOB that started while she was getting ready for work. SOB has been ongoing throughout that day. Endorses productive cough x 3 weeks. Denies Chest Pain. HX Asthma.

## 2023-05-13 ENCOUNTER — Emergency Department (HOSPITAL_COMMUNITY): Payer: Medicaid Other

## 2023-05-13 DIAGNOSIS — R059 Cough, unspecified: Secondary | ICD-10-CM | POA: Diagnosis not present

## 2023-05-13 DIAGNOSIS — R0602 Shortness of breath: Secondary | ICD-10-CM | POA: Diagnosis not present

## 2023-05-13 LAB — D-DIMER, QUANTITATIVE: D-Dimer, Quant: 0.55 ug/mL-FEU — ABNORMAL HIGH (ref 0.00–0.50)

## 2023-05-13 LAB — TROPONIN I (HIGH SENSITIVITY): Troponin I (High Sensitivity): <2 ng/L (ref <18)

## 2023-05-13 MED ORDER — IOHEXOL 350 MG/ML SOLN
75.0000 mL | Freq: Once | INTRAVENOUS | Status: AC | PRN
  Administered 2023-05-13: 75 mL via INTRAVENOUS

## 2023-05-13 MED ORDER — ALBUTEROL SULFATE (2.5 MG/3ML) 0.083% IN NEBU
5.0000 mg | INHALATION_SOLUTION | Freq: Once | RESPIRATORY_TRACT | Status: AC
  Administered 2023-05-13: 5 mg via RESPIRATORY_TRACT
  Filled 2023-05-13: qty 6

## 2023-05-13 NOTE — ED Provider Notes (Signed)
[place] EMERGENCY DEPARTMENT AT [facility] Provider Note   CSN: [id number] Arrival date & time: 05/12/23  2001     History  Chief Complaint  Patient presents with   Shortness of Breath    Veronica [name] is a 32 y.o. female.  The history is provided by the patient and medical records.  Shortness of Breath  Veronica [name] is a 32 y.o. female who presents to the Emergency Department complaining of shortness of breath.  She presents to the emergency department for evaluation of difficulty breathing that started this morning.  She complains of cough productive of clear mucus.  No hemoptysis.  No fever, chest pain, leg swelling or pain.  No known sick contacts.  She does have a history of asthma and takes Flovent and albuterol.  Started on Victoza 2 weeks ago for weight loss.  No hx/o DVT/PE.  No recent surgeries.  Has nexplanon.   No tobacco, alcohol, drugs.      Home Medications Prior to Admission medications   Medication Sig Start Date End Date Taking? Authorizing Provider  albuterol (VENTOLIN HFA) 108 (90 Base) MCG/ACT inhaler INHALE 2 PUFFS BY MOUTH EVERY 4 HOURS AS NEEDED FOR WHEEZING OR SHORTNESS OF BREATH (COUGH). 01/30/23   Raspet, Noberto Retort, [name]  cetirizine (ZYRTEC) 10 MG tablet Take 10 mg by mouth daily.    [provider]  escitalopram (LEXAPRO) 10 MG tablet Take 1 tablet (10 mg total) by mouth daily. 04/10/23   Cora Collum, DO  etonogestrel (NEXPLANON) 68 MG IMPL implant 1 each by Subdermal route once.    [provider]  famotidine (PEPCID) 20 MG tablet Take 1 tablet by mouth once daily 02/17/23   Cora Collum, DO  ferrous sulfate 325 (65 FE) MG tablet Take 1 tablet (325 mg total) by mouth every other day. 02/13/23   Cora Collum, DO  fluticasone (FLONASE) 50 MCG/ACT nasal spray Place 2 sprays into both nostrils daily. 04/06/23   Cora Collum, DO  fluticasone (FLOVENT HFA) 110 MCG/ACT inhaler Inhale 2 puffs into the lungs 2  (two) times daily. 05/05/23   [name], DO  hydrOXYzine (ATARAX) 25 MG tablet Take 1 tablet (25 mg total) by mouth 3 (three) times daily as needed for anxiety. 02/13/23   Cora Collum, DO  liraglutide (VICTOZA) 18 MG/3ML SOPN Inject 1.2 mg into the skin daily. 04/25/23   Cora Collum, DO  Multiple Vitamins-Minerals (ONE-A-DAY WOMENS PO) Take 1 tablet by mouth daily.    [provider]  pantoprazole (PROTONIX) 40 MG tablet Take 1 tablet by mouth once daily 04/29/23   Parrett, Tammy S, NP  polyethylene glycol (MIRALAX / GLYCOLAX) 17 g packet Take 17 g by mouth daily. 06/01/21   Gerrit Heck, CNM      Allergies    Morphine, Ibuprofen, and Morphine and codeine    Review of Systems   Review of Systems  Respiratory:  Positive for shortness of breath.   All other systems reviewed and are negative.   Physical Exam Updated Vital Signs BP 132/81   Pulse 87   Temp 98.2 F (36.8 C) (Oral)   Resp 17   Ht 5\' 2"  (1.575 m)   Wt 112 kg   LMP 05/09/2023   SpO2 94%   BMI 45.18 kg/m  Physical Exam Vitals and nursing note reviewed.  Constitutional:      Appearance: She is well-developed.  HENT:     Head: Normocephalic and  atraumatic.  Cardiovascular:     Rate and Rhythm: Normal rate and regular rhythm.     Heart sounds: No murmur heard. Pulmonary:     Effort: Pulmonary effort is normal. No respiratory distress.     Breath sounds: Normal breath sounds.  Abdominal:     Palpations: Abdomen is soft.     Tenderness: There is no abdominal tenderness. There is no guarding or rebound.  Musculoskeletal:        General: No swelling or tenderness.  Skin:    General: Skin is warm and dry.  Neurological:     Mental Status: She is alert and oriented to person, place, and time.  Psychiatric:        Behavior: Behavior normal.     ED Results / Procedures / Treatments   Labs (all labs ordered are listed, but only abnormal results are displayed) Labs Reviewed  BASIC METABOLIC  PANEL - Abnormal; Notable for the following components:      Result Value   Creatinine, Ser 1.12 (*)    All other components within normal limits  D-DIMER, QUANTITATIVE - Abnormal; Notable for the following components:   D-Dimer, Quant 0.55 (*)    All other components within normal limits  RESP PANEL BY RT-PCR (RSV, FLU A&B, COVID)  RVPGX2  CBC WITH DIFFERENTIAL/PLATELET  BRAIN NATRIURETIC PEPTIDE  MAGNESIUM  TROPONIN I (HIGH SENSITIVITY)  TROPONIN I (HIGH SENSITIVITY)    EKG None  Radiology CT Angio Chest PE W/Cm &/Or Wo Cm  Result Date: 05/13/2023 CLINICAL DATA:  Pulmonary embolism suspected, low to intermediate probability, positive D-dimer. Shortness of breath and productive cough. History of asthma. EXAM: CT ANGIOGRAPHY CHEST WITH CONTRAST TECHNIQUE: Multidetector CT imaging of the chest was performed using the standard protocol during bolus administration of intravenous contrast. Multiplanar CT image reconstructions and MIPs were obtained to evaluate the vascular anatomy. RADIATION DOSE REDUCTION: This exam was performed according to the departmental dose-optimization program which includes automated exposure control, adjustment of the mA and/or kV according to patient size and/or use of iterative reconstruction technique. CONTRAST:  75mL OMNIPAQUE IOHEXOL 350 MG/ML SOLN COMPARISON:  04/27/2019 FINDINGS: Cardiovascular: The heart is normal in size and there is no pericardial effusion. The aorta is normal in caliber. There is mild dilatation of the pulmonary trunk which may be associated with underlying pulmonary artery hypertension. No definite evidence of pulmonary embolism. Examination is limited due to mixing artifact. Mediastinum/Nodes: No mediastinal, hilar, or axillary lymphadenopathy. The thyroid gland, trachea, and esophagus are within normal limits. There is a small hiatal hernia. Lungs/Pleura: Lungs are clear. No pleural effusion or pneumothorax. Upper Abdomen: The gallbladder is  surgically absent. No acute abnormality. Musculoskeletal: No acute osseous abnormality. Review of the MIP images confirms the above findings. IMPRESSION: 1. No evidence of pulmonary embolism or other acute process. 2. Small hiatal hernia. Electronically Signed   By: Thornell Sartorius M.D.   On: 05/13/2023 03:27   DG Chest 2 View  Result Date: 05/12/2023 CLINICAL DATA:  Dyspnea EXAM: CHEST - 2 VIEW COMPARISON:  09/11/2022 FINDINGS: The heart size and mediastinal contours are within normal limits. Both lungs are clear. The visualized skeletal structures are unremarkable. IMPRESSION: No active cardiopulmonary disease. Electronically Signed   By: Deatra Robinson M.D.   On: 05/12/2023 20:54    Procedures Procedures    Medications Ordered in ED Medications  albuterol (PROVENTIL) (2.5 MG/3ML) 0.083% nebulizer solution 5 mg (5 mg Nebulization Given 05/13/23 0133)  iohexol (OMNIPAQUE) 350 MG/ML injection 75  mL (75 mLs Intravenous Contrast Given 05/13/23 0310)    ED Course/ Medical Decision Making/ A&P                             Medical Decision Making Amount and/or Complexity of Data Reviewed Labs: ordered. Radiology: ordered.  Risk Prescription drug management.   Patient with history of asthma here for evaluation of difficulty breathing.  Lungs are clear on examination with no respiratory distress.  Chest x-ray is negative for acute infiltrate-images personally reviewed and interpreted, agree with radiologist interpretation.  She is low risk for DVT/PE.  A D-dimer was obtained, which was mildly elevated.  CTA was obtained, which is negative for PE or pneumonia.  BNP and troponin are negative.  Current clinical picture is not consistent with ACS, acute CHF.  She was trialed with albuterol nebulizer treatment with no significant change in lung exam or symptoms.  She is able to ambulate the department without hypoxia.  Discussed with patient unclear source of her symptoms.  Feel she is stable for PCP  follow-up for further evaluation with close return precautions for new or concerning symptoms.        Final Clinical Impression(s) / ED Diagnoses Final diagnoses:  Shortness of breath    Rx / DC Orders ED Discharge Orders     None         Tilden Fossa, MD 05/13/23 [phone]

## 2023-05-13 NOTE — ED Notes (Signed)
Pt ambulated without difficulty and pulse ox in place with O2 sats remaining between 96%-98%RA

## 2023-05-13 NOTE — ED Notes (Signed)
Patient verbalizes understanding of discharge instructions. Opportunity for questioning and answers were provided. Armband removed by staff, pt discharged from ED. Ambulated out to lobby  

## 2023-05-14 ENCOUNTER — Ambulatory Visit (HOSPITAL_COMMUNITY)
Admission: EM | Admit: 2023-05-14 | Discharge: 2023-05-14 | Disposition: A | Discharge status: Home / Self Care | Payer: Medicaid Other | Attending: Physician Assistant | Admitting: Physician Assistant

## 2023-05-14 ENCOUNTER — Other Ambulatory Visit: Payer: Self-pay

## 2023-05-14 ENCOUNTER — Encounter (HOSPITAL_COMMUNITY): Payer: Self-pay | Admitting: Emergency Medicine

## 2023-05-14 DIAGNOSIS — G44209 Tension-type headache, unspecified, not intractable: Secondary | ICD-10-CM

## 2023-05-14 MED ORDER — DEXAMETHASONE SODIUM PHOSPHATE 10 MG/ML IJ SOLN
INTRAMUSCULAR | Status: AC
  Filled 2023-05-14: qty 1

## 2023-05-14 MED ORDER — KETOROLAC TROMETHAMINE 30 MG/ML IJ SOLN
30.0000 mg | Freq: Once | INTRAMUSCULAR | Status: AC
  Administered 2023-05-14: 30 mg via INTRAMUSCULAR

## 2023-05-14 MED ORDER — KETOROLAC TROMETHAMINE 30 MG/ML IJ SOLN
INTRAMUSCULAR | Status: AC
  Filled 2023-05-14: qty 1

## 2023-05-14 MED ORDER — BACLOFEN 10 MG PO TABS: 10.0000 mg | ORAL_TABLET | Freq: Every evening | ORAL | 0 refills | Status: DC | PRN

## 2023-05-14 MED ORDER — DEXAMETHASONE SODIUM PHOSPHATE 10 MG/ML IJ SOLN
10.0000 mg | Freq: Once | INTRAMUSCULAR | Status: AC
  Administered 2023-05-14: 10 mg via INTRAMUSCULAR

## 2023-05-14 NOTE — ED Provider Notes (Signed)
[facility]    CSN: [id number] Arrival date & time: 05/14/23  1810      History   Chief Complaint Chief Complaint  Patient presents with   Headache    HPI Veronica [name] is a 32 y.o. female.   Patient presents today with a 12+ hour history of headache.  She reports that she woke up this morning with a headache and it has waxed and waned in intensity but not resolved.  Currently pain is rated 9 on a scheduled pain scale, localized to frontal region, described as throbbing with periodic pressure and sharp pains, no alleviating factors identified.  Has tried Tylenol without improvement of symptoms.  She is unable to take NSAIDs due to history of GERD and hiatal hernia.  She has had headaches in the past but denies formal diagnosis of primary headache disorder.  Denies history of migraines in her first-degree relatives.  Denies any associated dysarthria, nausea, vomiting, visual disturbance, focal weakness, numbness, paresthesias.  She denies any recent head trauma.  Denies any recent medication changes.  She does report she has had some mild nasal congestion but denies any additional URI symptoms.  She was seen in the emergency room a few days ago for shortness of breath but reports that this is significantly improved; had negative workup at that time and was discharged with albuterol inhaler.  She is eating and drinking normally.    Past Medical History:  Diagnosis Date   Abdominal pain 01/06/2012   Acute tension headache 07/27/2019   Anemia    Ankle pain    Anxiety    Asthma    Birth control counseling 08/26/2011   BMI 40.0-44.9, adult (HCC) 02/27/2011   GERD (gastroesophageal reflux disease)    Heart murmur    Hiatal hernia    Obesity (BMI 30-39.9)    Pre-eclampsia    off med since 4/23   Secondary amenorrhea 03/25/2018    Patient Active Problem List   Diagnosis Date Noted   Obesity 03/28/2023   Trichomonas infection 06/17/2022   Nexplanon insertion  05/19/2022   Intertrigo 04/17/2022   Health maintenance examination 04/01/2022   Nocturnal oxygen desaturation 03/23/2022   Gallstones    Allergic rhinitis 03/07/2022   Anxiety 11/26/2020   Asthma 08/31/2020   Back pain 02/13/2020   Hiatal hernia 11/02/2019   Epigastric pain 09/13/2019   Vaginal irritation 06/23/2019   Subclinical hypothyroidism 05/19/2019   GAD (generalized anxiety disorder) 05/09/2019   Chronic cough 10/16/2017   Fatigue 05/09/2016   Dysuria 01/13/2013   GERD (gastroesophageal reflux disease) 03/28/2011   BMI 50.0-59.9, adult (HCC) 02/27/2011   Anemia 02/27/2011    Past Surgical History:  Procedure Laterality Date   CESAREAN SECTION  09/02/2010   For macrosomia, but son was 7 lb 12 oz   CESAREAN SECTION N/A 10/06/2021   Procedure: CESAREAN SECTION;  Surgeon: Franklin Bing, MD;  Location: [facility];  Service: Obstetrics;  Laterality: N/A;   CHOLECYSTECTOMY N/A 06/16/2022   Procedure: LAPAROSCOPIC  CHOLECYSTECTOMY;  Surgeon: Berna Bue, MD;  Location: [facility];  Service: General;  Laterality: N/A;   TONSILLECTOMY AND ADENOIDECTOMY      OB History     Gravida  2   Para  2   Term  2   Preterm      AB      Living  2      SAB      IAB      Ectopic  Multiple  0   Live Births  2            Home Medications    Prior to Admission medications   Medication Sig Start Date End Date Taking? Authorizing Provider  baclofen (LIORESAL) 10 MG tablet Take 1 tablet (10 mg total) by mouth at bedtime as needed for muscle spasms. 05/14/23  Yes Junior Kenedy K, [name]  albuterol (VENTOLIN HFA) 108 (90 Base) MCG/ACT inhaler INHALE 2 PUFFS BY MOUTH EVERY 4 HOURS AS NEEDED FOR WHEEZING OR SHORTNESS OF BREATH (COUGH). 01/30/23   Debbrah Sampedro, Noberto Retort, [name]  cetirizine (ZYRTEC) 10 MG tablet Take 10 mg by mouth daily.    [provider]  escitalopram (LEXAPRO) 10 MG tablet Take 1 tablet (10 mg total) by mouth daily. 04/10/23   Cora Collum, DO   etonogestrel (NEXPLANON) 68 MG IMPL implant 1 each by Subdermal route once.    [provider]  famotidine (PEPCID) 20 MG tablet Take 1 tablet by mouth once daily 02/17/23   Cora Collum, DO  ferrous sulfate 325 (65 FE) MG tablet Take 1 tablet (325 mg total) by mouth every other day. 02/13/23   Cora Collum, DO  fluticasone (FLONASE) 50 MCG/ACT nasal spray Place 2 sprays into both nostrils daily. 04/06/23   Cora Collum, DO  fluticasone (FLOVENT HFA) 110 MCG/ACT inhaler Inhale 2 puffs into the lungs 2 (two) times daily. 05/05/23   [name], DO  hydrOXYzine (ATARAX) 25 MG tablet Take 1 tablet (25 mg total) by mouth 3 (three) times daily as needed for anxiety. 02/13/23   Cora Collum, DO  liraglutide (VICTOZA) 18 MG/3ML SOPN Inject 1.2 mg into the skin daily. 04/25/23   Cora Collum, DO  Multiple Vitamins-Minerals (ONE-A-DAY WOMENS PO) Take 1 tablet by mouth daily.    [provider]  pantoprazole (PROTONIX) 40 MG tablet Take 1 tablet by mouth once daily 04/29/23   Parrett, Tammy S, NP  polyethylene glycol (MIRALAX / GLYCOLAX) 17 g packet Take 17 g by mouth daily. 06/01/21   Gerrit Heck, CNM    Family History Family History  Problem Relation Age of Onset   Diabetes Mother    Hypertension Mother    Obesity Mother    Heart attack Mother 61       Death   Obesity Sister    Osteochondroma Sister    Diabetes Sister    Hypertension Sister     Social History Social History   Tobacco Use   Smoking status: Never   Smokeless tobacco: Never  Vaping Use   Vaping Use: Never used  Substance Use Topics   Alcohol use: Yes    Comment: occ   Drug use: Not Currently    Comment: last used in teens     Allergies   Morphine, Ibuprofen, and Morphine and codeine   Review of Systems Review of Systems  Constitutional:  Positive for activity change. Negative for appetite change, fatigue and fever.  HENT:  Negative for congestion, sinus pressure, sneezing  and sore throat.   Eyes:  Negative for photophobia and visual disturbance.  Respiratory:  Negative for cough (intermittent but improved) and shortness of breath (improved).   Cardiovascular:  Negative for chest pain.  Gastrointestinal:  Negative for abdominal pain, diarrhea, nausea and vomiting.  Musculoskeletal:  Negative for arthralgias and myalgias.  Neurological:  Positive for headaches. Negative for dizziness, syncope, facial asymmetry, speech difficulty, weakness, light-headedness and numbness.     Physical  Exam Triage Vital Signs ED Triage Vitals  Enc Vitals Group     BP 05/14/23 1909 122/84     Pulse Rate 05/14/23 1909 80     Resp 05/14/23 1909 18     Temp 05/14/23 1909 98 F (36.7 C)     Temp Source 05/14/23 1909 Oral     SpO2 05/14/23 1909 97 %     Weight --      Height --      Head Circumference --      Peak Flow --      Pain Score 05/14/23 1907 9     Pain Loc --      Pain Edu? --      Excl. in GC? --    No data found.  Updated Vital Signs BP 122/84 (BP Location: Right Arm)   Pulse 80   Temp 98 F (36.7 C) (Oral)   Resp 18   LMP 05/09/2023   SpO2 97%   Breastfeeding No   Visual Acuity Right Eye Distance:   Left Eye Distance:   Bilateral Distance:    Right Eye Near:   Left Eye Near:    Bilateral Near:     Physical Exam Vitals reviewed.  Constitutional:      General: She is awake. She is not in acute distress.    Appearance: Normal appearance. She is well-developed. She is not ill-appearing.     Comments: Very pleasant female appears stated age in no acute distress sitting comfortably in exam room  HENT:     Head: Normocephalic and atraumatic. No raccoon eyes, Battle's sign or contusion.     Right Ear: Tympanic membrane, ear canal and external ear normal. No hemotympanum.     Left Ear: Tympanic membrane, ear canal and external ear normal. No hemotympanum.     Nose: Nose normal.     Mouth/Throat:     Tongue: Tongue does not deviate from midline.      Pharynx: Uvula midline. No oropharyngeal exudate or posterior oropharyngeal erythema.  Eyes:     Extraocular Movements: Extraocular movements intact.     Conjunctiva/sclera: Conjunctivae normal.     Pupils: Pupils are equal, round, and reactive to light.  Cardiovascular:     Rate and Rhythm: Normal rate and regular rhythm.     Heart sounds: Normal heart sounds, S1 normal and S2 normal. No murmur heard. Pulmonary:     Effort: Pulmonary effort is normal.     Breath sounds: Normal breath sounds. No wheezing, rhonchi or rales.     Comments: Clear to auscultation bilaterally Musculoskeletal:     Cervical back: Normal range of motion and neck supple. No tenderness or bony tenderness.     Thoracic back: No tenderness or bony tenderness.     Lumbar back: No tenderness or bony tenderness.     Comments: Strength 5/5 bilateral upper and lower extremities  Neurological:     General: No focal deficit present.     Mental Status: She is alert and oriented to person, place, and time.     Motor: Motor function is intact.     Coordination: Coordination is intact.     Gait: Gait is intact.  Psychiatric:        Behavior: Behavior is cooperative.      UC Treatments / Results  Labs (all labs ordered are listed, but only abnormal results are displayed) Labs Reviewed - No data to display  EKG   Radiology CT Angio Chest PE  W/Cm &/Or Wo Cm  Result Date: 05/13/2023 CLINICAL DATA:  Pulmonary embolism suspected, low to intermediate probability, positive D-dimer. Shortness of breath and productive cough. History of asthma. EXAM: CT ANGIOGRAPHY CHEST WITH CONTRAST TECHNIQUE: Multidetector CT imaging of the chest was performed using the standard protocol during bolus administration of intravenous contrast. Multiplanar CT image reconstructions and MIPs were obtained to evaluate the vascular anatomy. RADIATION DOSE REDUCTION: This exam was performed according to the departmental dose-optimization program  which includes automated exposure control, adjustment of the mA and/or kV according to patient size and/or use of iterative reconstruction technique. CONTRAST:  75mL OMNIPAQUE IOHEXOL 350 MG/ML SOLN COMPARISON:  04/27/2019 FINDINGS: Cardiovascular: The heart is normal in size and there is no pericardial effusion. The aorta is normal in caliber. There is mild dilatation of the pulmonary trunk which may be associated with underlying pulmonary artery hypertension. No definite evidence of pulmonary embolism. Examination is limited due to mixing artifact. Mediastinum/Nodes: No mediastinal, hilar, or axillary lymphadenopathy. The thyroid gland, trachea, and esophagus are within normal limits. There is a small hiatal hernia. Lungs/Pleura: Lungs are clear. No pleural effusion or pneumothorax. Upper Abdomen: The gallbladder is surgically absent. No acute abnormality. Musculoskeletal: No acute osseous abnormality. Review of the MIP images confirms the above findings. IMPRESSION: 1. No evidence of pulmonary embolism or other acute process. 2. Small hiatal hernia. Electronically Signed   By: Thornell Sartorius M.D.   On: 05/13/2023 03:27   DG Chest 2 View  Result Date: 05/12/2023 CLINICAL DATA:  Dyspnea EXAM: CHEST - 2 VIEW COMPARISON:  09/11/2022 FINDINGS: The heart size and mediastinal contours are within normal limits. Both lungs are clear. The visualized skeletal structures are unremarkable. IMPRESSION: No active cardiopulmonary disease. Electronically Signed   By: Deatra Robinson M.D.   On: 05/12/2023 20:54    Procedures Procedures (including critical care time)  Medications Ordered in UC Medications  ketorolac (TORADOL) 30 MG/ML injection 30 mg (30 mg Intramuscular Given 05/14/23 1945)  dexamethasone (DECADRON) injection 10 mg (10 mg Intramuscular Given 05/14/23 1945)    Initial Impression / Assessment and Plan / UC Course  I have reviewed the triage vital signs and the nursing notes.  Pertinent labs & imaging  results that were available during my care of the patient were reviewed by me and considered in my medical decision making (see chart for details).     Patient is well-appearing, afebrile, nontoxic, nontachycardic.  Vital signs and physical exam are reassuring with no indication for emergent evaluation or imaging.  Initially patient reported that this was for second of her life and we discussed that this would warrant going to the emergency room for head CT.  Patient declined ER workup prefer to try medication.  She was given 10 mg of Decadron and 30 mg of Toradol in clinic with significant improvement of symptoms; her pain reduced from a 9 to a 4.  Discussed that this is very reassuring and she should drink plenty of fluid and eat small frequent meals.  She was given baclofen for additional symptom relief but we discussed that this can be sedating and she is not to drive or drink alcohol while taking it.  If her symptoms or not improving by tomorrow with the medication regimen or if she has any worsening symptoms she needs to be seen immediately.  We discussed that if her symptoms worsen in any way and she has worsening headache, nausea, vomiting, dysarthria, focal weakness, visual disturbance she needs to go to the  emergency room immediately to which she expressed understanding.  Strict return precautions given.  Work excuse note provided.   Final Clinical Impressions(s) / UC Diagnoses   Final diagnoses:  Tension headache     Discharge Instructions      I am very glad that you are feeling much better after the medication.  Take baclofen at night.  This should help with the headache.  This will make you sleepy so do not drive or drink alcohol with taking it.  You can take Tylenol.  You should not take any NSAIDs including aspirin, ibuprofen/Advil, naproxen/Aleve for minimum of 12 hours from when we gave you the injection.  Make sure that you are drinking plenty fluids and eating small frequent  meals.  Follow-up with your primary care.  If you have any worsening symptoms including sudden severe headache, nausea, vomiting, weakness, trouble speaking, vision change you need to be seen immediately.     ED Prescriptions     Medication Sig Dispense Auth. Provider   baclofen (LIORESAL) 10 MG tablet Take 1 tablet (10 mg total) by mouth at bedtime as needed for muscle spasms. 10 each Maalle Starrett, Noberto Retort, [name]      PDMP not reviewed this encounter.   Jeani Hawking, [name] 05/14/23 2024

## 2023-05-14 NOTE — ED Triage Notes (Signed)
Patient presents to [facility] for evaluation of headache since waking this morning.  Patient c/o aching and pressure sensation to the front of her head.  C/o nausea intermittent, denies vomiting.  C/o intermittent cough.

## 2023-05-14 NOTE — Discharge Instructions (Signed)
I am very glad that you are feeling much better after the medication.  Take baclofen at night.  This should help with the headache.  This will make you sleepy so do not drive or drink alcohol with taking it.  You can take Tylenol.  You should not take any NSAIDs including aspirin, ibuprofen/Advil, naproxen/Aleve for minimum of 12 hours from when we gave you the injection.  Make sure that you are drinking plenty fluids and eating small frequent meals.  Follow-up with your primary care.  If you have any worsening symptoms including sudden severe headache, nausea, vomiting, weakness, trouble speaking, vision change you need to be seen immediately.

## 2023-05-15 ENCOUNTER — Ambulatory Visit (INDEPENDENT_AMBULATORY_CARE_PROVIDER_SITE_OTHER): Payer: Medicaid Other | Admitting: Family Medicine

## 2023-05-15 ENCOUNTER — Encounter: Payer: Self-pay | Admitting: Family Medicine

## 2023-05-15 VITALS — BP 110/60 | HR 70 | Ht 62.0 in | Wt 242.0 lb

## 2023-05-15 DIAGNOSIS — Z6841 Body Mass Index (BMI) 40.0 and over, adult: Secondary | ICD-10-CM

## 2023-05-15 DIAGNOSIS — K5900 Constipation, unspecified: Secondary | ICD-10-CM | POA: Diagnosis not present

## 2023-05-15 DIAGNOSIS — G44209 Tension-type headache, unspecified, not intractable: Secondary | ICD-10-CM

## 2023-05-15 DIAGNOSIS — R519 Headache, unspecified: Secondary | ICD-10-CM

## 2023-05-15 MED ORDER — POLYETHYLENE GLYCOL 3350 17 G PO PACK: 17.0000 g | PACK | Freq: Two times a day (BID) | ORAL | 1 refills | Status: DC

## 2023-05-15 MED ORDER — SENNA 8.6 MG PO TABS
1.0000 | ORAL_TABLET | Freq: Two times a day (BID) | ORAL | 0 refills | Status: AC
Start: 2023-05-15 — End: ?

## 2023-05-15 NOTE — Patient Instructions (Addendum)
It was wonderful to see you today. Thank you for allowing me to be a part of your care. Below is a short summary of what we discussed at your visit today:  Weight loss desired STOP Victoza  I have referred you to bariatrics for weight loss counseling.  Someone from their office should be calling you in 1 to 2 weeks to schedule an appointment.  If you do not hear from them, let us know. We may need to nudge along the referral.    Headache Try tylenol, aspirin, or aleve.  I believe once we stop the Victoza these will get better.  Let us know if these headaches do not improve in about 5 or so days after stopping the Victoza. We can send in migraine medication.   Constipation Get a 32 ounce gatorade. Dump into a big bottle or pitcher. Mix in 8 scoops of miralax. Drink throughout the day and stay by the bathroom!  START miralax and senna.  Take miralax twice daily. Follow with a LOT of water.  Take senna twice a day.  Do this until you are having normal bowel movements.   Allergies Try benadryl at night.  Other non-drowsy medications besides zyrtec and claritin include xyzal and allegra.   Please bring all of your medications to every appointment! If you have any questions or concerns, please do not hesitate to contact us via phone or MyChart message.   Fayette Pho, MD s

## 2023-05-15 NOTE — Progress Notes (Unsigned)
    SUBJECTIVE:   CHIEF COMPLAINT / HPI:   Veronica [name] is a pleasant 32 yo woman here with concerns for side effects of her new medication Victoza.   She reports starting Victoza 2 weeks ago as an adjunct to help with diabetes and weight loss. She reports that since then, she has experienced headache, shortness of breath, chest pain, and constipation. She has been needing to use not only her daily flovent but her rescue albuterol too daily.   Was seen in our office 5/28 for SOB and then the ED 6/01 for vaginal bleeding, 6/04 for shortness of breath, and then urgent care 6/06 for a terrible headache. Labs thus far re-assuring. TVUS, CXR, and CT angio chest all negative.   She believes these symptoms are related to the Victoza and she would like to stop, but she was unsure if there would be any side effects or rebound effects from stopping suddenly.   PERTINENT  PMH / PSH:  Patient Active Problem List   Diagnosis Date Noted   Constipation 05/17/2023   Obesity 03/28/2023   Nexplanon insertion 05/19/2022   Intertrigo 04/17/2022   Health maintenance examination 04/01/2022   Nocturnal oxygen desaturation 03/23/2022   Gallstones    Allergic rhinitis 03/07/2022   Anxiety 11/26/2020   Asthma 08/31/2020   Back pain 02/13/2020   Hiatal hernia 11/02/2019   Vaginal irritation 06/23/2019   Subclinical hypothyroidism 05/19/2019   GAD (generalized anxiety disorder) 05/09/2019   Nonintractable episodic headache 11/15/2018   Chronic cough 10/16/2017   Fatigue 05/09/2016   GERD (gastroesophageal reflux disease) 03/28/2011   BMI 50.0-59.9, adult (HCC) 02/27/2011   Anemia 02/27/2011    OBJECTIVE:   BP 110/60   Pulse 70   Ht 5\' 2"  (1.575 m)   Wt 242 lb (109.8 kg)   LMP 05/09/2023   Breastfeeding No   BMI 44.26 kg/m    PHQ-9:     05/15/2023    3:07 PM 05/05/2023   10:40 AM 04/23/2023    3:15 PM  Depression screen PHQ 2/9  Decreased Interest 0 0 0  Down, Depressed, Hopeless 0 0 0  PHQ -  2 Score 0 0 0  Altered sleeping 1 1 1   Tired, decreased energy 1 1 1   Change in appetite 1 0 0  Feeling bad or failure about yourself  0 0 0  Trouble concentrating 0 0 0  Moving slowly or fidgety/restless 0 0 0  Suicidal thoughts 0 0 0  PHQ-9 Score 3 2 2   Difficult doing work/chores  Not difficult at all     Physical Exam General: Awake, alert, oriented HEENT: sclera anicteric and non-injected, EOM intact, PEARRL (non-painful), moist oral mucosa without lesion Neck: no palpable lympadenopathy Cardiovascular: Regular rate and rhythm, S1 and S2 present, no murmurs auscultated Respiratory: Lung fields clear to auscultation bilaterally  ASSESSMENT/PLAN:   BMI 50.0-59.9, adult (HCC) Will stop Victoza. Patient amenable to bariatrics referral for the comprehensive weight loss services, even if she decides against surgery.   Constipation No red flags. Last BM one week ago. Start miralax and senna. See AVS for more.   Nonintractable episodic headache Would like to see if these improve once we stop the Victoza. Discussed tylenol, aspirin, caffeine, and sleep to help. If headaches are no better after stopping the Victoza, could consider migraine abortifacient medication.      Fayette Pho, MD [facility]

## 2023-05-17 ENCOUNTER — Encounter: Payer: Self-pay | Admitting: Family Medicine

## 2023-05-17 DIAGNOSIS — K5900 Constipation, unspecified: Secondary | ICD-10-CM | POA: Insufficient documentation

## 2023-05-17 NOTE — Assessment & Plan Note (Signed)
No red flags. Last BM one week ago. Start miralax and senna. See AVS for more.

## 2023-05-17 NOTE — Progress Notes (Incomplete)
    SUBJECTIVE:   CHIEF COMPLAINT / HPI:   *** Headahce, SOB  Went to ED 6/04 for SOB, chest pain, headache  6/06 for headache - worst of her life  Constipation too, no BM in a week  Needing to use the inhaler every day, both flovent and albuterol  All these symptoms started after beginning Victoza on 5/20  Headache - sensitive to sound yesterday - hurts with turning head and sneezing - nausea with headaches - better with toradol  PERTINENT  PMH / PSH:  Patient Active Problem List   Diagnosis Date Noted  . Obesity 03/28/2023  . Trichomonas infection 06/17/2022  . Nexplanon insertion 05/19/2022  . Intertrigo 04/17/2022  . Health maintenance examination 04/01/2022  . Nocturnal oxygen desaturation 03/23/2022  . Gallstones   . Allergic rhinitis 03/07/2022  . Anxiety 11/26/2020  . Asthma 08/31/2020  . Back pain 02/13/2020  . Hiatal hernia 11/02/2019  . Epigastric pain 09/13/2019  . Vaginal irritation 06/23/2019  . Subclinical hypothyroidism 05/19/2019  . GAD (generalized anxiety disorder) 05/09/2019  . Chronic cough 10/16/2017  . Fatigue 05/09/2016  . Dysuria 01/13/2013  . GERD (gastroesophageal reflux disease) 03/28/2011  . BMI 50.0-59.9, adult (HCC) 02/27/2011  . Anemia 02/27/2011    OBJECTIVE:   BP 110/60   Pulse 70   Ht 5\' 2"  (1.575 m)   Wt 242 lb (109.8 kg)   LMP 05/09/2023   Breastfeeding No   BMI 44.26 kg/m   ***  ASSESSMENT/PLAN:   No problem-specific Assessment & Plan notes found for this encounter.     Fayette Pho, MD [facility]

## 2023-05-17 NOTE — Assessment & Plan Note (Signed)
Would like to see if these improve once we stop the Victoza. Discussed tylenol, aspirin, caffeine, and sleep to help. If headaches are no better after stopping the Victoza, could consider migraine abortifacient medication.

## 2023-05-17 NOTE — Assessment & Plan Note (Signed)
Will stop Victoza. Patient amenable to bariatrics referral for the comprehensive weight loss services, even if she decides against surgery.

## 2023-05-20 ENCOUNTER — Other Ambulatory Visit: Payer: Self-pay | Admitting: Family Medicine

## 2023-05-20 DIAGNOSIS — D649 Anemia, unspecified: Secondary | ICD-10-CM

## 2023-05-20 MED ORDER — PANTOPRAZOLE SODIUM 40 MG PO TBEC: 40.0000 mg | DELAYED_RELEASE_TABLET | Freq: Every day | ORAL | 3 refills | Status: DC

## 2023-05-22 ENCOUNTER — Encounter: Payer: Self-pay | Admitting: Family Medicine

## 2023-05-22 ENCOUNTER — Ambulatory Visit (INDEPENDENT_AMBULATORY_CARE_PROVIDER_SITE_OTHER): Payer: Medicaid Other | Admitting: Family Medicine

## 2023-05-22 ENCOUNTER — Other Ambulatory Visit: Payer: Self-pay

## 2023-05-22 VITALS — BP 109/72 | HR 69 | Ht 62.0 in | Wt 243.2 lb

## 2023-05-22 DIAGNOSIS — J302 Other seasonal allergic rhinitis: Secondary | ICD-10-CM

## 2023-05-22 DIAGNOSIS — Z6841 Body Mass Index (BMI) 40.0 and over, adult: Secondary | ICD-10-CM | POA: Diagnosis present

## 2023-05-22 MED ORDER — LORATADINE 10 MG PO TABS
10.0000 mg | ORAL_TABLET | Freq: Every day | ORAL | 11 refills | Status: DC
Start: 2023-05-22 — End: 2024-03-21

## 2023-05-22 NOTE — Patient Instructions (Signed)
It was great seeing you today!  I am glad you are doing well! Below is a list of therapists you can reach out to and schedule an appointment with. I have also sent in for allergy medication. Continue your flonase daily.    Therapy and Counseling Resources Most providers on this list will take Medicaid. Patients with commercial insurance or Medicare should contact their insurance company to get a list of in network providers.  [facility] (takes children) Location 1: [address] [place], [place] [address] Location 2: [address] [place], [place] [address] [phone]   [facility] (spanish speaking therapist available)(habla espanol)(take medicare and medicaid)  [address], [place], [place] [address], [email or web address] [phone]  [facility]Psychiatry and Counseling [address] [facility] [address] [place], [place] [address] [phone]  [facility]   [address] [place], [place] [address]      [phone]  [facility] (spanish available) [address]  [place], [place] [address] [phone]  [facility] (take [facility] and medicare) [address]  [place], [place] [address]       [phone]     [facility] (virtual only) [phone]  Jovita Kussmaul [facility] [address], [place], [place] [phone]  [facility]:  [address] [place] [phone]  [facility]:  [address] [place] [place] [phone]  [facility] (under & uninsured) [address]   [place] [phone]    [email or web address]    [facility] [address]  [place]    [phone]  [facility] -[address]     Phone:  [phone]     [facility]-  [address]  [phone]   [facility] [address]      [place], [place] [phone]  [facility]: [address] [place], [place], [place]  [phone]   [facility] (Spanish therapist) [email or web address]  [address] [place]  [address]   [place] [address]    [phone]    [facility]   [address],  [place], [place]  [phone]   [facility]  [address] [place]  [phone]  [facility]  [address] [place], [place]        [phone]  Open Access/Walk In Clinic under & uninsured  [facility]  [address] [place], [place] [phone] Crisis [phone]  [facility],  (Spanish)   [address], [place]: [phone]) 8:30 - 12; 1 - 2:30  [facility] [employer],  [address] [place], [place]    [phone]):8:30 - 12; 2 - 3PM  [facility] [employer],  [address],  [place]; [phone]):   Mon - Fri 8 AM - 5 PM  [facility] [address] [place]  MWF 12:30 to 3:00 or call to schedule an appointment  [phone]  Specific Provider options Psychology Today  [email or web address] click on find a therapist  enter your zip code left side and select or tailor a therapist for your specific need.   [facility] Provider Directory [email or web address]  (Medicaid)   Follow all drop down to find a provider  Social Support program [facility] [place] [phone] or  PhotoS[employer]pl [address], [place], [place] [facility] support and educational   24- Hour Availability:   [facility]  [address] [place], [place] [phone] Crisis [phone]  [facility] [phone]  [facility] [facility]  [phone]   [facility]  [phone] (after  hours)  Therapeutic Alternative/Mobile Crisis   [phone]  [place] National Suicide Hotline  [phone] Len Childs)  Call 911 or go to emergency room  [facility]  [phone]);  [place] and [employer]  [phone]); [place], [place], [place], [place], [place], [place], [place]     Feel free to call with any questions or concerns at any time, at [phone].   Take care,  Dr. Cora Collum [facility]

## 2023-05-22 NOTE — Progress Notes (Unsigned)
    SUBJECTIVE:   CHIEF COMPLAINT / HPI:   Stopped taking the   Was having shortness of breath and had a number of tets done and she states it was presumed to be asthma and was sent home with albuterol. About a week ago had a really bad headache states she had a shot that took it away. She says urgent care told her that her SOB was due to constipation. Feels the headache and constipation was due to the Victoza so she stopped taking it.  Endorses allergies has been doing  States zyrtec dried her throat out and causing ear pain, open to a different   Anxiety flared last night and instead of taking medication she counted down from 10 to 1 and was able to calm herself down.  Open to therapist   PERTINENT  PMH / PSH: ***  OBJECTIVE:   BP 109/72   Pulse 69   Ht 5\' 2"  (1.575 m)   Wt 243 lb 3.2 oz (110.3 kg)   LMP 05/09/2023   SpO2 99%   BMI 44.48 kg/m   ***  ASSESSMENT/PLAN:   No problem-specific Assessment & Plan notes found for this encounter.     Cora Collum, DO [facility]

## 2023-05-23 NOTE — Progress Notes (Incomplete)
    SUBJECTIVE:   CHIEF COMPLAINT / HPI:   Ms.Veronica [name] presents for follow up on her medication.   States she was recently having shortness of breath and had a number of tests done in the ED and she states it was presumed to be asthma and was sent home with albuterol. About a week ago had a really bad headache states she was given a shot in urgent care that took it away. She says urgent care told her that her SOB was due to constipation. Feels the headache and constipation was due to the Victoza so she stopped taking it.  Endorses allergies, has been using zyrtec and flonase. States zyrtec dried her throat out and causing ear pain, would like to try a different medication.   Anxiety flared last night and instead of taking medication she counted down from 10 to 1 and was able to calm herself down.  Open to therapist   PERTINENT  PMH / PSH: Reviewed   OBJECTIVE:   BP 109/72   Pulse 69   Ht 5\' 2"  (1.575 m)   Wt 243 lb 3.2 oz (110.3 kg)   LMP 05/09/2023   SpO2 99%   BMI 44.48 kg/m    General: alert, pleasant, NAD CV: RRR no murmurs Resp: CTAB normal WOB GI: soft, non distended   ASSESSMENT/PLAN:   No problem-specific Assessment & Plan notes found for this encounter.     Cora Collum, DO [facility]

## 2023-05-24 NOTE — Assessment & Plan Note (Signed)
Will continue Flonase daily and switch from Cetirizine to Loratadine 10mg  daily

## 2023-05-24 NOTE — Assessment & Plan Note (Addendum)
BMI currently 44. Wanted to discuss stopping Victoza due to side effects. Has discussed this and bariatric surgery at a visit with another resident earlier this month. Will continue to monitor and encourage lifestyle changes.

## 2023-05-25 ENCOUNTER — Ambulatory Visit (INDEPENDENT_AMBULATORY_CARE_PROVIDER_SITE_OTHER): Payer: Medicaid Other | Admitting: Family Medicine

## 2023-05-25 ENCOUNTER — Encounter: Payer: Self-pay | Admitting: Family Medicine

## 2023-05-25 ENCOUNTER — Other Ambulatory Visit (HOSPITAL_COMMUNITY)
Admission: RE | Admit: 2023-05-25 | Discharge: 2023-05-25 | Disposition: A | Discharge status: Home / Self Care | Payer: Medicaid Other | Source: Ambulatory Visit | Attending: Family Medicine | Admitting: Family Medicine

## 2023-05-25 VITALS — BP 129/87 | HR 90 | Ht 62.0 in | Wt 243.2 lb

## 2023-05-25 DIAGNOSIS — N898 Other specified noninflammatory disorders of vagina: Secondary | ICD-10-CM | POA: Diagnosis present

## 2023-05-25 LAB — POCT WET PREP (WET MOUNT)
Clue Cells Wet Prep Whiff POC: NEGATIVE
Trichomonas Wet Prep HPF POC: ABSENT

## 2023-05-25 MED ORDER — FLUCONAZOLE 150 MG PO TABS: 150.0000 mg | ORAL_TABLET | Freq: Once | ORAL | 0 refills | Status: AC

## 2023-05-25 NOTE — Patient Instructions (Signed)
Your immediate testing was negative for yeast and bacterial vaginosis.  Your other testing will take a few days to come back, I will let you know the results via MyChart.

## 2023-05-25 NOTE — Progress Notes (Signed)
    SUBJECTIVE:   CHIEF COMPLAINT / HPI:   Vaginal pruritus and irritation - started a few days ago - is wanting to have STD testing - has newer long term partner - has nexplanon for birth control  PERTINENT  PMH / PSH: Reviewed  OBJECTIVE:   BP 129/87   Pulse 90   Ht 5\' 2"  (1.575 m)   Wt 243 lb 3.2 oz (110.3 kg)   LMP 05/09/2023   SpO2 98%   BMI 44.48 kg/m   General: NAD, well-appearing, well-nourished Respiratory: No respiratory distress, breathing comfortably, able to speak in full sentences Skin: warm and dry, no rashes noted on exposed skin Psych: Appropriate affect and mood Pelvic exam: VULVA: normal appearing vulva with no masses, tenderness or lesions, VAGINA: vaginal discharge - white and creamy, WET MOUNT done - results: excessive bacteria, exam chaperoned by Cleatrice Burke, CMA.  ASSESSMENT/PLAN:   Vaginal Irritation Patient with vaginal irritation and pruritus of the introitus region.  Physical exam more consistent with yeast infection, though wet prep testing only showed bacteria present.  Will go ahead and treat as yeast and follow further STD testing. - Follow-up gonorrhea/committee testing - Diflucan 150 mg x 1 dose - Follow-up if no improvement   Jadrien Narine, DO [facility]

## 2023-05-27 LAB — CERVICOVAGINAL ANCILLARY ONLY
Chlamydia: POSITIVE — AB
Comment: NEGATIVE
Comment: NEGATIVE
Comment: NORMAL
Neisseria Gonorrhea: NEGATIVE
Trichomonas: POSITIVE — AB

## 2023-05-28 ENCOUNTER — Ambulatory Visit (INDEPENDENT_AMBULATORY_CARE_PROVIDER_SITE_OTHER): Payer: Medicaid Other | Admitting: Family Medicine

## 2023-05-28 ENCOUNTER — Telehealth: Payer: Self-pay | Admitting: Family Medicine

## 2023-05-28 VITALS — BP 121/74 | HR 71 | Ht 62.0 in | Wt 241.4 lb

## 2023-05-28 DIAGNOSIS — Z202 Contact with and (suspected) exposure to infections with a predominantly sexual mode of transmission: Secondary | ICD-10-CM

## 2023-05-28 MED ORDER — DOXYCYCLINE HYCLATE 100 MG PO TABS: 100.0000 mg | ORAL_TABLET | Freq: Two times a day (BID) | ORAL | 0 refills | Status: DC

## 2023-05-28 MED ORDER — METRONIDAZOLE 500 MG PO TABS: 2000.0000 mg | ORAL_TABLET | Freq: Once | ORAL | 0 refills | Status: AC

## 2023-05-28 NOTE — Patient Instructions (Addendum)
We are going to get your tests for HIV and syphilis.

## 2023-05-28 NOTE — Progress Notes (Signed)
    SUBJECTIVE:   CHIEF COMPLAINT / HPI:   STD testing - Recently tested positive for trichomoniasis and chlamydia - Antibiotics were prescribed earlier this morning  - No longer with the partner that she got the infections from - She has not heard back from him about getting himself treated  PERTINENT  PMH / PSH: Reviewed  OBJECTIVE:   BP 121/74   Pulse 71   Ht 5\' 2"  (1.575 m)   Wt 241 lb 6 oz (109.5 kg)   LMP 05/09/2023   SpO2 100%   BMI 44.15 kg/m   General: NAD, well-appearing, well-nourished Respiratory: No respiratory distress, breathing comfortably, able to speak in full sentences Skin: warm and dry, no rashes noted on exposed skin Psych: Appropriate affect and mood  ASSESSMENT/PLAN:   STD exposures Recently tested positive for chlamydia and trichomoniasis, has been sent the prescription for the antibiotics earlier this morning.  Patient would like to proceed with blood testing for syphilis and HIV. - HIV and RPR ordered - Encouraged condom use - If prior partner would like to get treated she can let our office know, or he should go to be seen at urgent care/his PCP   Evelena Leyden, DO [facility]

## 2023-05-28 NOTE — Telephone Encounter (Signed)
Called patient regarding trich and chlamydial infections. Patient prescribed one time dose of Metronidazole 2g (trich treatment) as well as doxycycline for the chlamydial infection.   Discussed with patient potential for partner to be treated, she states they are no longer together. If he wishes to be treated we can try to facilitate if he is agreeable.   Prescriptions sent and patient instructed to re-test in 3 months   Veronica Revak, DO

## 2023-05-29 LAB — HIV ANTIBODY (ROUTINE TESTING W REFLEX): HIV Screen 4th Generation wRfx: NONREACTIVE

## 2023-05-29 LAB — RPR: RPR Ser Ql: NONREACTIVE

## 2023-06-01 ENCOUNTER — Telehealth: Payer: Self-pay

## 2023-06-01 NOTE — Telephone Encounter (Signed)
-----   Message from Jennette Bill, CMA sent at 06/01/2023 10:17 AM EDT ----- Regarding: STI reporting Pos chlamydia

## 2023-06-01 NOTE — Telephone Encounter (Signed)
Sti form faxed to [facility].        Aquilla Solian, CMA

## 2023-06-04 ENCOUNTER — Encounter: Payer: Self-pay | Admitting: Nurse Practitioner

## 2023-06-04 ENCOUNTER — Ambulatory Visit (INDEPENDENT_AMBULATORY_CARE_PROVIDER_SITE_OTHER): Payer: Medicaid Other | Admitting: Nurse Practitioner

## 2023-06-04 VITALS — BP 118/62 | HR 78 | Ht 62.0 in | Wt 242.0 lb

## 2023-06-04 DIAGNOSIS — D509 Iron deficiency anemia, unspecified: Secondary | ICD-10-CM

## 2023-06-04 DIAGNOSIS — K219 Gastro-esophageal reflux disease without esophagitis: Secondary | ICD-10-CM | POA: Diagnosis not present

## 2023-06-04 DIAGNOSIS — D500 Iron deficiency anemia secondary to blood loss (chronic): Secondary | ICD-10-CM

## 2023-06-04 DIAGNOSIS — K5900 Constipation, unspecified: Secondary | ICD-10-CM

## 2023-06-04 NOTE — Progress Notes (Signed)
Primary GI: Erick Blinks, MD  Assessment / Plan   32 y.o. yo female with a past medical history consisting of, but not necessarily limited to GERD with hiatal hernia, obesity, asthma, anxiety, ureteral stones, iron deficiency anemia.  Patient referred by PCP for GERD  Chronic GERD with hiatal hernia ( 4 cm on EGD in 2020). Recent breakthrough symptoms on 20 mg Pantoprazole, now .  Symptoms nearly resolved after increase in dose to 40 mg daily  (was also prescribed Pepcid at bedtime but no needed since pantoprazole dose was increased)  -Discussed anti-reflux measures such as avoidance of late meals / bedtime snacks, HOB elevation (or use of wedge pillow), weight reduction ( if applicable)  / maintaining a healthy BMI ( body mass index),  and avoidance of trigger foods and caffeine.  -Continue pantoprazole 30 minutes before breakfast. -Follow-up with me in about 3 months.  She has been working diligently to lose weight.  Hopefully with weight loss/lifestyle modifications she can get back to a lower dose of pantoprazole.   Early satiety without nausea or weight loss.  Monitor for now. Will reevaluate at time of follow up visit  Constipation, acute.   Patient on chronic iron but previously never struggled with constipation because she took stool softeners with iron .  Developed constipation recently, thinks was related to Victoza .  Constipation resolved with senna and MiraLAX .  No longer taking Victoza  and constipation improved. Only taking Senna TIW. -Would like to see if we can get her back on previous bowel regimen which worked. Will stop senna for now. Take two stool s softeners at bedtime -60 oz of water daily -Can take Miralax,  1 capful in 8 oz of water daily as needed for constipation. -She can take senna as needed if above measures fail -Follow up with me in 2-3 months.  -TSH is normal  Ongoing shortness of breath.  Etiology unclear. Has been addressed by PCP and also sees  Pulmonary, ? Has asthma. Recent ED [facility] and CTA negative for PE. Hgb in 12 range  Chronic IDA, evaluated by [place] for this in 2013.  Had Flex sig ( rectal bleeding and anemia ) and EGD without findings to explain chronic anemia. Saw Hematology last in 2014. IDA thought possibly IDA was 2/2 to menstruation.  On chronic iron. Hgb stable at 12.5 though iron deficienct as recently as a year ago. Menstrual probably cannot account for persistent anemia as she has Nexplanon implant with reduced menstrual cycles.  -Consider endoscopic evaluation with EGD and colonoscopy at some point but probably low yield given chronicity of iron deficiency.   Recent + testing for STD -on antibiotics.    History of Present Illness   Chief Complaint: Constipation, GERD symptoms(improved)  Veronica [name] has chronic GERD. She was taking 20 mg of Pantoprazole daily but having breakthrough GERD symptoms.  PCP added Pepcid at night.  She is followed by Pulmonary for asthma versus reactive airway .  Pulmonary thinks GERD may be contributing to her pulmonary symptoms . In late March 2024 Pulmonary increased pantoprazole to 40 mg daily , continued the Pepcid at bedtime.  She has been doing much better since the increase in her pantoprazole which she takes in the morning before breakfast.  Since the increase she has not had much in the way of heartburn.  In fact she is not even taking nighttime dose of Pepcid anymore  Veronica [name] complains of shortness of breath after eating . She has  some early satiety.  No associated weight loss . No nausea.  Recent evaluation at the ED for shortness of breath . D-dimer mildly elevated.  CTA was obtained and negative for PE or pneumonia.  Troponins were normal.  Not felt to have ACS.  Her CBC was normal  Veronica [name] has been on oral iron since the age of 78.  She has not really had problems with constipation, possibly because she always took stool softeners with iron .  She did recently develop  constipation but  thinks it was 2/2 to Victoza .  Stool softeners were stopped, she was started on senna and MiraLAX as needed .  Victoza was discontinued .  Since then constipation has improved, now only taking Senna three times a week, no need for the MiraLAX.    Previous Endoscopies / Labs /  Imaging   Flexible sigmoidoscopy September 2013 for evaluation of iron deficiency anemia, abdominal pain and rectal bleeding.  Extent of exam to the descending colon.  Prep was excellent -Colonic mucosa appeared normal nothing found to explain the rectal bleeding or iron deficiency  November 2020 EGD for globus sensation -4 cm hiatal hernia, exam otherwise normal.  No specimens collected      Latest Ref Rng & Units 05/12/2023    8:23 PM 05/09/2023    9:40 AM 03/16/2023    5:23 PM  CBC  WBC 4.0 - 10.5 K/uL 7.0  6.0  7.0   Hemoglobin 12.0 - 15.0 g/dL 16.1  09.6  04.5   Hematocrit 36.0 - 46.0 % 38.5  37.8  37.3   Platelets 150 - 400 K/uL 349  313  321     Lab Results  Component Value Date   LIPASE 29 05/09/2023      Latest Ref Rng & Units 05/12/2023    8:23 PM 05/09/2023    9:40 AM 09/11/2022   10:29 AM  CMP  Glucose 70 - 99 mg/dL 92  84  96   BUN 6 - 20 mg/dL 9  7  10    Creatinine 0.44 - 1.00 mg/dL 4.09  8.11  9.14   Sodium 135 - 145 mmol/L 138  137  138   Potassium 3.5 - 5.1 mmol/L 3.6  3.5  3.6   Chloride 98 - 111 mmol/L 105  106  109   CO2 22 - 32 mmol/L 24  23  23    Calcium 8.9 - 10.3 mg/dL 8.9  8.8  8.9   Total Protein 6.5 - 8.1 g/dL  7.1    Total Bilirubin 0.3 - 1.2 mg/dL  0.6    Alkaline Phos 38 - 126 U/L  55    AST 15 - 41 U/L  10    ALT 0 - 44 U/L  14        CT Angio Chest PE W/Cm &/Or Wo Cm CLINICAL DATA:  Pulmonary embolism suspected, low to intermediate probability, positive D-dimer. Shortness of breath and productive cough. History of asthma.  EXAM: CT ANGIOGRAPHY CHEST WITH CONTRAST  TECHNIQUE: Multidetector CT imaging of the chest was performed using the standard  protocol during bolus administration of intravenous contrast. Multiplanar CT image reconstructions and MIPs were obtained to evaluate the vascular anatomy.  RADIATION DOSE REDUCTION: This exam was performed according to the departmental dose-optimization program which includes automated exposure control, adjustment of the mA and/or kV according to patient size and/or use of iterative reconstruction technique.  CONTRAST:  75mL OMNIPAQUE IOHEXOL 350 MG/ML SOLN  COMPARISON:  04/27/2019  FINDINGS: Cardiovascular: The heart is normal in size and there is no pericardial effusion. The aorta is normal in caliber. There is mild dilatation of the pulmonary trunk which may be associated with underlying pulmonary artery hypertension. No definite evidence of pulmonary embolism. Examination is limited due to mixing artifact.  Mediastinum/Nodes: No mediastinal, hilar, or axillary lymphadenopathy. The thyroid gland, trachea, and esophagus are within normal limits. There is a small hiatal hernia.  Lungs/Pleura: Lungs are clear. No pleural effusion or pneumothorax.  Upper Abdomen: The gallbladder is surgically absent. No acute abnormality.  Musculoskeletal: No acute osseous abnormality.  Review of the MIP images confirms the above findings.  IMPRESSION: 1. No evidence of pulmonary embolism or other acute process. 2. Small hiatal hernia.  Electronically Signed   By: Thornell Sartorius M.D.   On: 05/13/2023 03:27  CTAP with contrast 03/13/22 IMPRESSION: 1. Similar right-sided perinephric fat stranding, most conspicuously about the inferior pole, with small foci of renal cortical hypoenhancement. Findings remain consistent with pyelonephritis. 2. Fat stranding about the incompletely opacified right ureter. There are again rounded calculi in the vicinity of the bilateral distal ureters,    Past Medical History:  Diagnosis Date   Abdominal pain 01/06/2012   Acute tension headache 07/27/2019    Anemia    Ankle pain    Anxiety    Asthma    Birth control counseling 08/26/2011   BMI 40.0-44.9, adult (HCC) 02/27/2011   Dysuria 01/13/2013   Epigastric pain 09/13/2019   GERD (gastroesophageal reflux disease)    Heart murmur    Hiatal hernia    Obesity (BMI 30-39.9)    Pre-eclampsia    off med since 4/23   Secondary amenorrhea 03/25/2018   Trichomonas infection 06/17/2022   Past Surgical History:  Procedure Laterality Date   CESAREAN SECTION  09/02/2010   For macrosomia, but son was 7 lb 12 oz   CESAREAN SECTION N/A 10/06/2021   Procedure: CESAREAN SECTION;  Surgeon: Turnerville Bing, MD;  Location: [facility] LD ORS;  Service: Obstetrics;  Laterality: N/A;   CHOLECYSTECTOMY N/A 06/16/2022   Procedure: LAPAROSCOPIC  CHOLECYSTECTOMY;  Surgeon: Berna Bue, MD;  Location: [facility] ORS;  Service: General;  Laterality: N/A;   TONSILLECTOMY AND ADENOIDECTOMY     Family History  Problem Relation Age of Onset   Diabetes Mother    Hypertension Mother    Obesity Mother    Heart attack Mother 16       Death   Obesity Sister    Osteochondroma Sister    Diabetes Sister    Hypertension Sister    Social History   Tobacco Use   Smoking status: Never   Smokeless tobacco: Never  Vaping Use   Vaping Use: Never used  Substance Use Topics   Alcohol use: Yes    Comment: occ   Drug use: Not Currently    Comment: last used in teens   Current Outpatient Medications  Medication Sig Dispense Refill   albuterol (VENTOLIN HFA) 108 (90 Base) MCG/ACT inhaler INHALE 2 PUFFS BY MOUTH EVERY 4 HOURS AS NEEDED FOR WHEEZING OR SHORTNESS OF BREATH (COUGH). 18 g 0   baclofen (LIORESAL) 10 MG tablet Take 1 tablet (10 mg total) by mouth at bedtime as needed for muscle spasms. 10 each 0   doxycycline (VIBRA-TABS) 100 MG tablet Take 1 tablet (100 mg total) by mouth 2 (two) times daily for 7 days. 14 tablet 0   escitalopram (LEXAPRO) 10 MG tablet Take 1 tablet (10 mg total)  by mouth daily. 30 tablet 0    etonogestrel (NEXPLANON) 68 MG IMPL implant 1 each by Subdermal route once.     famotidine (PEPCID) 20 MG tablet Take 1 tablet by mouth once daily 30 tablet 0   Ferrous Sulfate (IRON) 325 (65 Fe) MG TABS TAKE 1 TABLET BY MOUTH EVERY OTHER DAY 30 tablet 0   fluticasone (FLONASE) 50 MCG/ACT nasal spray Place 2 sprays into both nostrils daily. 16 g 6   fluticasone (FLOVENT HFA) 110 MCG/ACT inhaler Inhale 2 puffs into the lungs 2 (two) times daily. 1 each 5   hydrOXYzine (ATARAX) 25 MG tablet Take 1 tablet (25 mg total) by mouth 3 (three) times daily as needed for anxiety. 90 tablet 1   loratadine (CLARITIN) 10 MG tablet Take 1 tablet (10 mg total) by mouth daily. 30 tablet 11   Multiple Vitamins-Minerals (ONE-A-DAY WOMENS PO) Take 1 tablet by mouth daily.     pantoprazole (PROTONIX) 40 MG tablet Take 1 tablet (40 mg total) by mouth daily. 30 tablet 3   polyethylene glycol (MIRALAX / GLYCOLAX) 17 g packet Take 17 g by mouth 2 (two) times daily. 14 each 1   senna (SENOKOT) 8.6 MG TABS tablet Take 1 tablet (8.6 mg total) by mouth 2 (two) times daily. 120 tablet 0   No current facility-administered medications for this visit.   Allergies  Allergen Reactions   Morphine Anaphylaxis   Ibuprofen Nausea And Vomiting    Was told to not take this because it would flare stomach issues   Morphine And Codeine Other (See Comments)    Causes "chest to be heavy"     Review of Systems: Positive for anxiety, back pain, depression, headaches, heart rhythm changes, shortness of breath.  All other systems reviewed and negative except where noted in HPI.   Wt Readings from Last 3 Encounters:  05/28/23 241 lb 6 oz (109.5 kg)  05/25/23 243 lb 3.2 oz (110.3 kg)  05/22/23 243 lb 3.2 oz (110.3 kg)    Physical Exam:  LMP 05/09/2023  Constitutional:  Pleasant, obese female in no acute distress. Psychiatric:  Normal mood and affect. Behavior is normal. EENT: Pupils normal.  Conjunctivae are normal. No scleral  icterus. Neck supple.  Cardiovascular: Normal rate, regular rhythm.  Pulmonary/chest: Effort normal and breath sounds normal. No wheezing, rales or rhonchi. Abdominal: Soft, nondistended, nontender. Bowel sounds active throughout. There are no masses palpable. No hepatomegaly. Neurological: Alert and oriented to person place and time.  Willette Cluster, NP  06/04/2023, 9:38 AM

## 2023-06-04 NOTE — Patient Instructions (Addendum)
_______________________________________________________  If your blood pressure at your visit was 140/90 or greater, please contact your primary care physician to follow up on this.  If you are age 31 or younger, your body mass index should be between 19-25. Your Body mass index is 44.26 kg/m. If this is out of the aformentioned range listed, please consider follow up with your Primary Care Provider.  ________________________________________________________  The  GI providers would like to encourage you to use [facility] to communicate with providers for non-urgent requests or questions.  Due to long hold times on the telephone, sending your provider a message by [facility] may be a faster and more efficient way to get a response.  Please allow 48 business hours for a response.  Please remember that this is for non-urgent requests.  _______________________________________________________  Acid Reflux  Below are some measures you can take to possibly improve acid reflux symptoms . We may have discussed some of these today in the office. Not everything on this list may apply to you   --If you are taking anti-reflux ( GERD) medication be sure to take it 30 minutes before breakfast and if taking twice daily then also second dose should be 30 minutes before dinner.   --Avoid late meals / bedtime snacks.   --Avoid trigger foods ( foods which you know tend to aggravate you reflux symptoms). Some common trigger foods include spicy foods, fatty foods, acidic foods, chocolate and caffeine.  --Elevate the head of bed 6-8 inches on blocks or bricks. If not able to elevate the head of the bed consider purchasing a wedge pillow to sleep on.    --Weight reduction / maintain a healthy BMI ( body mass index) may be help with reflux symptoms  --Sometimes with the above mentioned "lifestyle changes" patients are able to reduce the amount of GERD medications they take. Our goal is to have you on the lowest effective  dose of medication  -stop senna -resume stool softeners, take two at bedtime -60 oz of water daily -Miralax  1 capful in 8 oz of water daily as needed for constipation.   You are scheduled to follow up in our office on 08-17-23 at 10am.  Thank you for entrusting me with your care and choosing [facility].  Gunnar Fusi, NP

## 2023-06-15 ENCOUNTER — Other Ambulatory Visit: Payer: Self-pay

## 2023-06-15 MED ORDER — ESCITALOPRAM OXALATE 10 MG PO TABS: 10.0000 mg | ORAL_TABLET | Freq: Every day | ORAL | 2 refills | Status: DC

## 2023-06-29 ENCOUNTER — Other Ambulatory Visit (HOSPITAL_COMMUNITY)
Admission: RE | Admit: 2023-06-29 | Discharge: 2023-06-29 | Disposition: A | Discharge status: Home / Self Care | Payer: Medicaid Other | Source: Ambulatory Visit | Attending: Family Medicine | Admitting: Family Medicine

## 2023-06-29 ENCOUNTER — Ambulatory Visit: Payer: Medicaid Other

## 2023-06-29 VITALS — BP 117/74 | HR 81 | Ht 62.0 in | Wt 243.8 lb

## 2023-06-29 DIAGNOSIS — Z113 Encounter for screening for infections with a predominantly sexual mode of transmission: Secondary | ICD-10-CM

## 2023-06-29 DIAGNOSIS — R350 Frequency of micturition: Secondary | ICD-10-CM | POA: Diagnosis present

## 2023-06-29 LAB — POCT URINALYSIS DIP (MANUAL ENTRY)
Bilirubin, UA: NEGATIVE
Blood, UA: NEGATIVE
Glucose, UA: NEGATIVE mg/dL
Ketones, POC UA: NEGATIVE mg/dL
Leukocytes, UA: NEGATIVE
Nitrite, UA: NEGATIVE
Protein Ur, POC: NEGATIVE mg/dL
Spec Grav, UA: 1.015 (ref 1.010–1.025)
Urobilinogen, UA: 0.2 E.U./dL
pH, UA: 6 (ref 5.0–8.0)

## 2023-06-29 LAB — POCT WET PREP (WET MOUNT)
Clue Cells Wet Prep Whiff POC: NEGATIVE
Trichomonas Wet Prep HPF POC: ABSENT

## 2023-06-29 NOTE — Progress Notes (Unsigned)
  SUBJECTIVE:   CHIEF COMPLAINT / HPI:   Presents today requesting urine and STD screening.  Sometimes she has pressure and increased frequency when she urinates and notes some vaginal discharge.  She does not have a new partner and has Nexplanon for contraception but just wants to be safe.  Patient also requests that she have her sugar checked.  Of note her A1c was 5.4 in early April.  PERTINENT  PMH / PSH: Anxiety  Patient Care Team: Bess Kinds, MD as PCP - General (Family Medicine) Jake Bathe, MD as PCP - Cardiology (Cardiology) OBJECTIVE:  BP 117/74   Pulse 81   Ht 5\' 2"  (1.575 m)   Wt 243 lb 12.8 oz (110.6 kg)   SpO2 100%   BMI 44.59 kg/m  Pelvic exam: VULVA: normal appearing vulva with no masses, tenderness or lesions, VAGINA: vaginal discharge - white and creamy, CERVIX: normal appearing cervix without discharge or lesions.  Chaperone: Jake Seats, CMA  ASSESSMENT/PLAN:  Urine frequency Normal urine. No UTI. -     POCT urinalysis dipstick  Screen for STD (sexually transmitted disease) Awaiting results.  -     POCT Wet Prep [facility]) -     Cervicovaginal ancillary only  [name], DO 06/30/2023, 7:21 AM PGY-3, [facility]

## 2023-06-29 NOTE — Patient Instructions (Signed)
It was great to see you today! Thank you for choosing [facility] for your primary care.  Today we addressed: Your urine shows no signs of UTI.  I will be in touch with you regarding the results of the swab.  If you haven't already, sign up for My Chart to have easy access to your labs results, and communication with your primary care physician.  We are checking some labs today. If they are abnormal, I will call you. If they are normal, I will send you a MyChart message (if it is active) or a letter in the mail. If you do not hear about your labs in the next 2 weeks, please call the office.  Please arrive 15 minutes before your appointment to ensure smooth check in process.  We appreciate your efforts in making this happen.  Thank you for allowing me to participate in your care, [name], DO 06/29/2023, 4:28 PM PGY-3, [facility]

## 2023-06-30 IMAGING — US US MFM FETAL BPP W/O NON-STRESS
1 series · 13 of 28 positions shown · non-contrast
Comparison: none

[Series 1: us mfm fetal bpp w/o non-stress · 35 acquisitions, 13 frames shown]
[im 2/35]
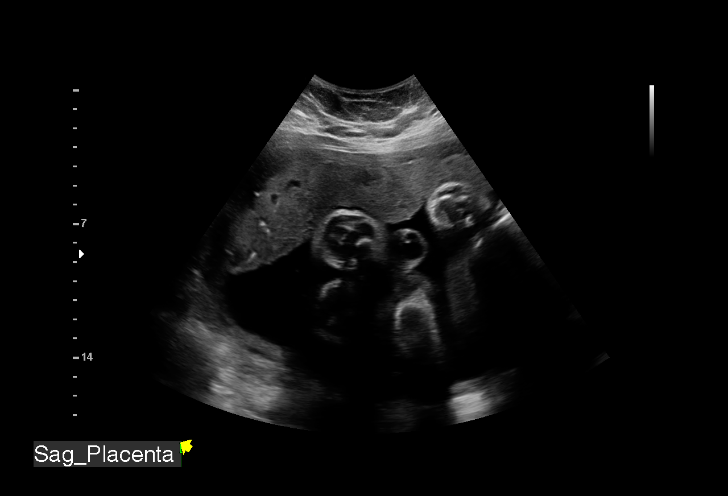
[im 4/35]
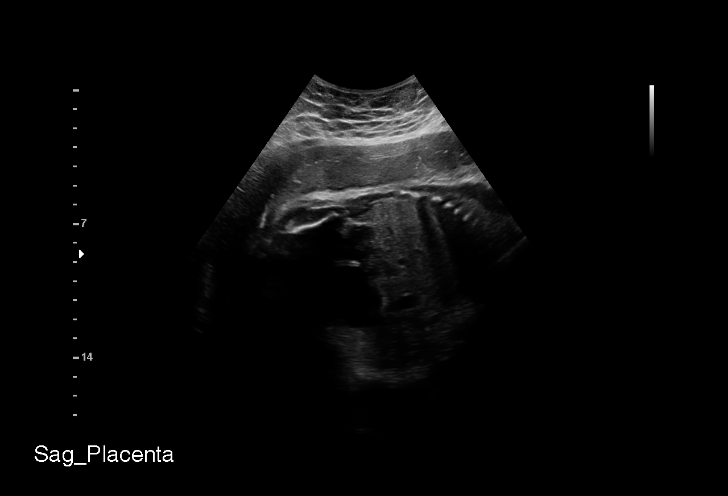
[im 7/35]
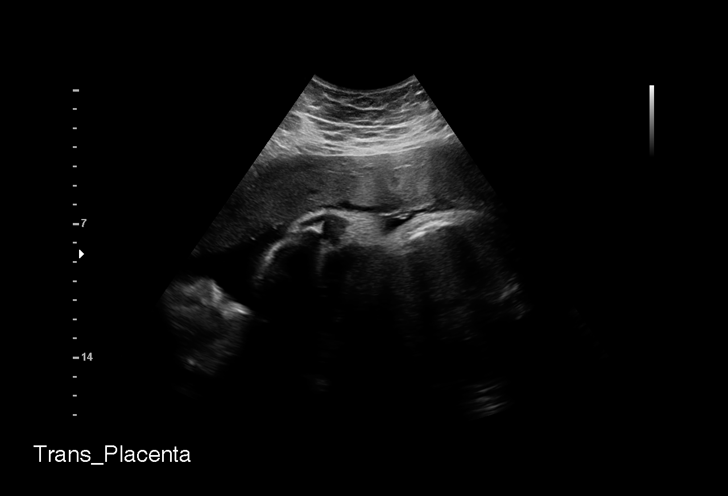
[im 9/35]
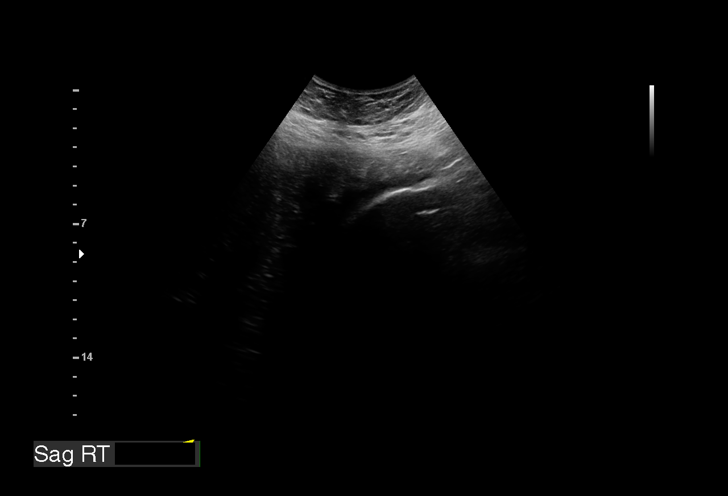
[im 12/35]
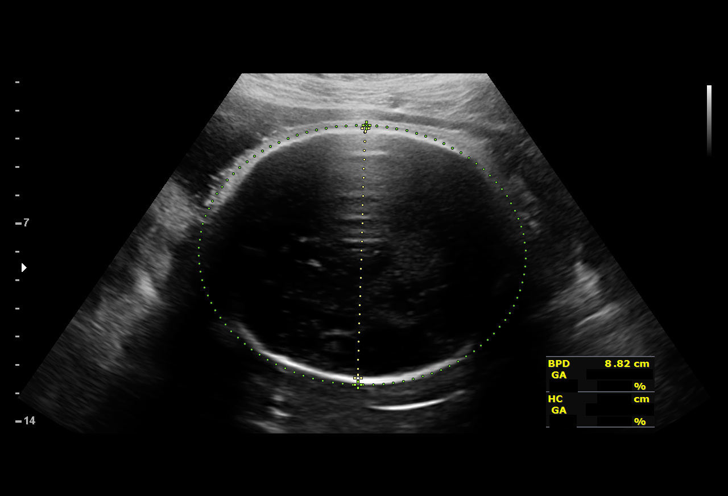
[im 14/35]
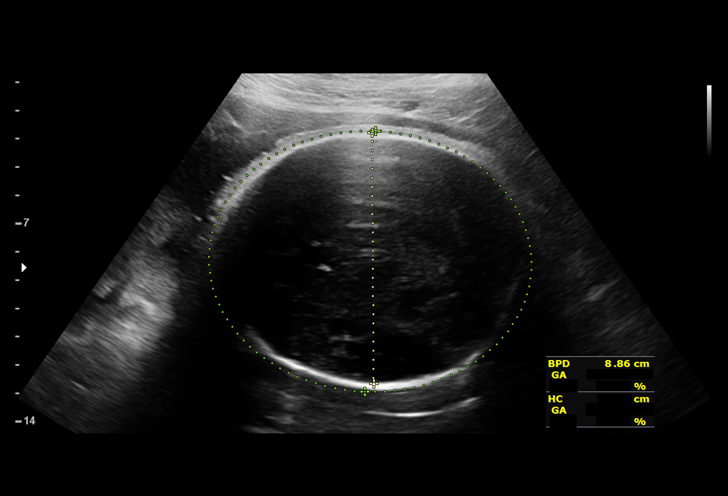
[im 18/35]
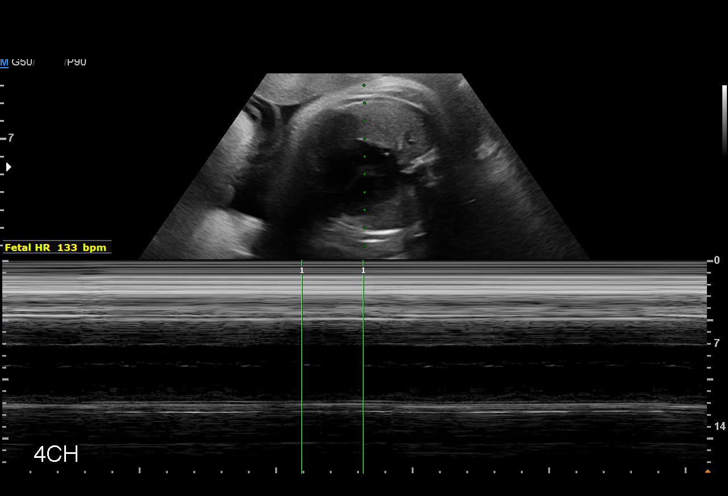
[im 21/35]
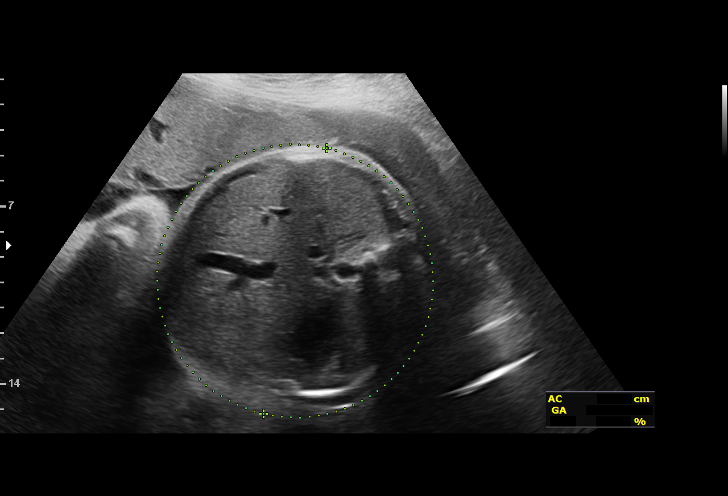
[im 23/35]
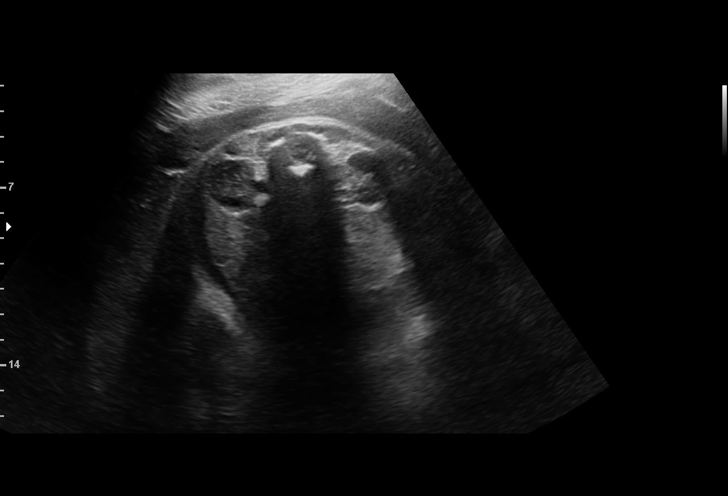
[im 26/35]
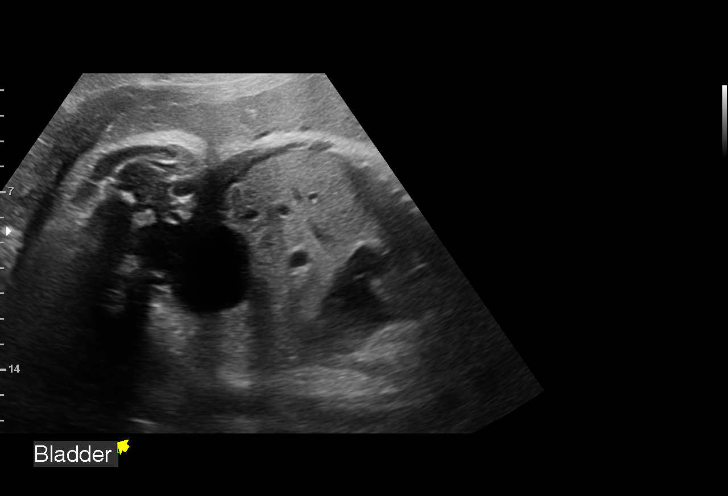
[im 28/35]
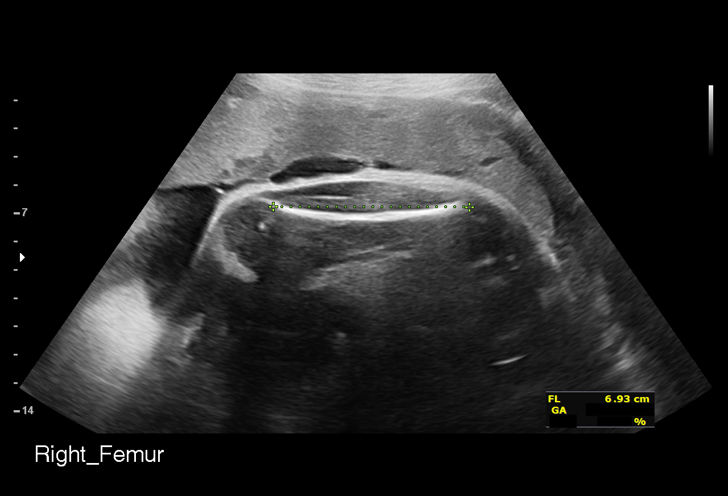
[im 31/35]
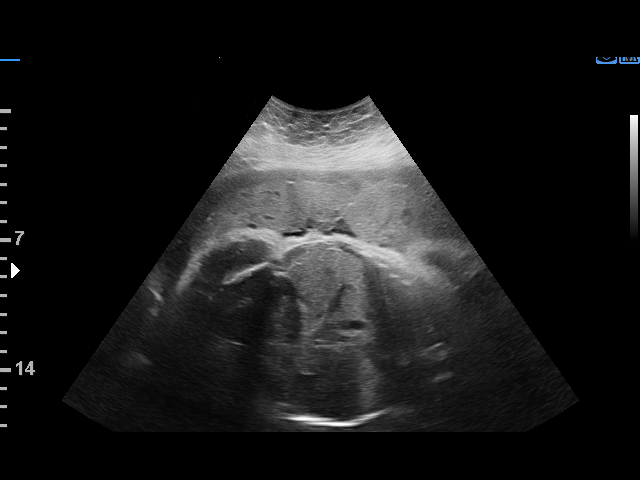
[im 33/35]
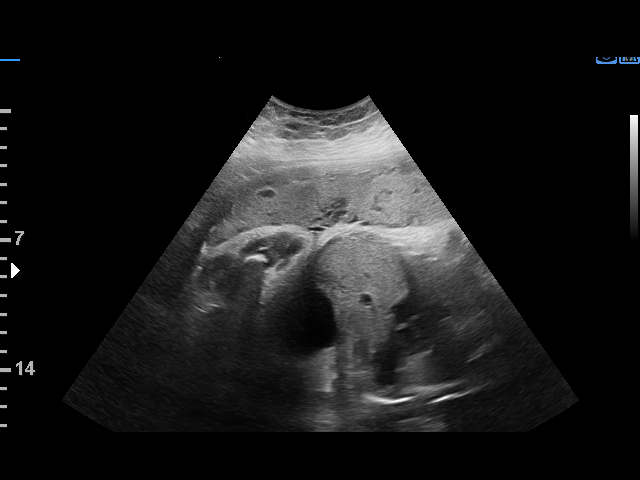

[13 of 28 positions shown; findings below may reference images not displayed]

Indications

 Obesity complicating pregnancy, second
 trimester (BMI 51)
 History of cesarean delivery, currently
 pregnant
 36 weeks gestation of pregnancy
 Anxiety during pregnancy, second trimester      O99.342,
 Anemia during pregnancy in second trimester
 LR NIPS/ Negative Horizon/ Negative AFP
Fetal Evaluation

 Num Of Fetuses:          1
 Fetal Heart Rate(bpm):   133
 Cardiac Activity:        Observed
 Presentation:            Cephalic
 Placenta:                Anterior
 P. Cord Insertion:       Previously Visualized

 Amniotic Fluid
 AFI FV:      Within normal limits

 AFI Sum(cm)     %Tile       Largest Pocket(cm)
 16.81           63

 RUQ(cm)       RLQ(cm)       LUQ(cm)        LLQ(cm)

Biophysical Evaluation

 Amniotic F.V:   Within normal limits       F. Tone:         Observed
 F. Movement:    Observed                   Score:           [DATE]
 F. Breathing:   Observed
Biometry

 BPD:      88.4  mm     G. Age:  35w 5d         35  %    CI:        74.17   %    70 - 86
                                                         FL/HC:       21.3  %    20.8 -
 HC:      325.9  mm     G. Age:  37w 0d         28  %    HC/AC:       0.97       0.92 -
 AC:      335.8  mm     G. Age:  37w 3d         82  %    FL/BPD:      78.4  %    71 - 87
 FL:       69.3  mm     G. Age:  35w 4d         20  %    FL/AC:       20.6  %    20 - 24
 LV:        6.6  mm

 Est. FW:    5820   gm    6 lb 11 oz     56  %
OB History

 Blood Type:   AB+
 Gravidity:    2         Term:   1        Prem:   0        SAB:   0
 TOP:          0       Ectopic:  0        Living: 1
Gestational Age

 LMP:           36w 5d        Date:  12/29/20                 EDD:   10/05/21
 U/S Today:     36w 3d                                        EDD:   10/07/21
 Best:          36w 5d     Det. By:  LMP  (12/29/20)          EDD:   10/05/21
Anatomy

 Cranium:               Appears normal         LVOT:                   Previously seen
 Cavum:                 Previously seen        Aortic Arch:            Not well visualized
 Ventricles:            Appears normal         Ductal Arch:            Previously seen
 Choroid Plexus:        Previously seen        Diaphragm:              Appears normal
 Cerebellum:            Previously seen        Stomach:                Appears normal, left
                                                                       sided
 Posterior Fossa:       Previously seen        Abdomen:                Previously seen
 Nuchal Fold:           Not applicable (>20    Abdominal Wall:         Previously seen
                        wks GA)
 Face:                  Orbits and profile     Cord Vessels:           Previously seen
                        previously seen
 Lips:                  Previously seen        Kidneys:                Appear normal
 Palate:                Not well visualized    Bladder:                Appears normal
 Thoracic:              Appears normal         Spine:                  Previously seen
 Heart:                 Previously seen        Upper Extremities:      Previously seen
 RVOT:                  Previously seen        Lower Extremities:      Previously seen

 Other:  Male Male gender previously seen. Heels previously seen. Limited
         views of the 3VV and 3VT previously seen. Technically difficult due to
         maternal habitus and fetal position.
Cervix Uterus Adnexa

 Cervix
 Not visualized (advanced GA >68wks)
 Uterus
 No abnormality visualized.

 Right Ovary
 Not visualized.

 Left Ovary
 Not visualized.

 Adnexa
 No adnexal mass visualized.
Impression

 Follow up growth due to elevated BMI
 Normal interval growth with measurements consistent with
 dates
 Good fetal movement and amniotic fluid volume
 Biophysical profile is [DATE]
Recommendations

 Continue weekly testing until delivery.

## 2023-07-01 LAB — CERVICOVAGINAL ANCILLARY ONLY
Chlamydia: NEGATIVE
Comment: NEGATIVE
Comment: NORMAL
Neisseria Gonorrhea: NEGATIVE

## 2023-07-03 ENCOUNTER — Telehealth: Payer: Self-pay

## 2023-07-03 ENCOUNTER — Other Ambulatory Visit: Payer: Self-pay | Admitting: Student

## 2023-07-03 MED ORDER — FLUCONAZOLE 150 MG PO TABS: 150.0000 mg | ORAL_TABLET | Freq: Once | ORAL | 0 refills | Status: AC

## 2023-07-03 NOTE — Telephone Encounter (Signed)
Patient calls nurse line in regards to results.   She reports she was told by provider she had BV and yeast, however medication has not been sent to the pharmacy yet.   I advised her results were negative for BV and yeast, as well as other STDs.  Patient reports she is still experiencing itching "towards the inside of my vagina." She denies any vaginal discharge or vaginal odors.  She is requesting a round of Diflucan.   Advised will send a message to provider.

## 2023-07-06 ENCOUNTER — Telehealth: Payer: Self-pay

## 2023-07-06 NOTE — Telephone Encounter (Signed)
Patient calls nurse line requesting new prescription for BP monitor be sent to [facility].   I have pended this order to the encounter. Please include applicable diagnosis code.   Thanks.   Veronda Prude, RN

## 2023-07-07 ENCOUNTER — Other Ambulatory Visit: Payer: Self-pay | Admitting: *Deleted

## 2023-07-07 DIAGNOSIS — F411 Generalized anxiety disorder: Secondary | ICD-10-CM

## 2023-07-07 IMAGING — US US MFM FETAL BPP W/O NON-STRESS
1 series · 15 of 28 positions shown · non-contrast
Comparison: none

[Series 1: us mfm fetal bpp w/o non-stress · 31 acquisitions, 15 frames shown]
[im 1/31]
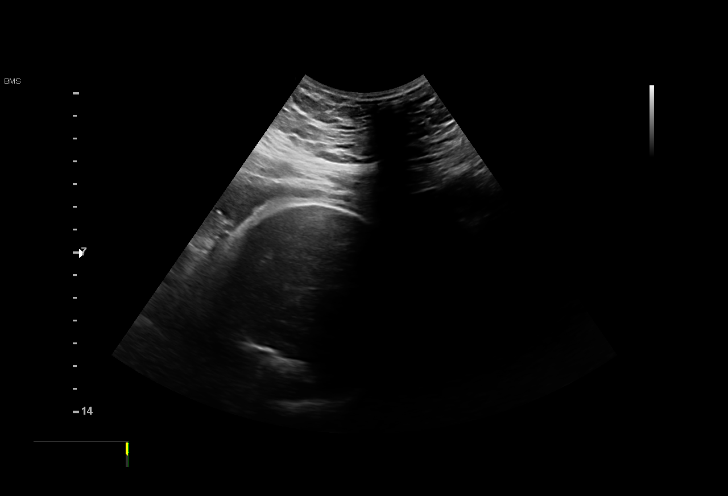
[im 3/31]
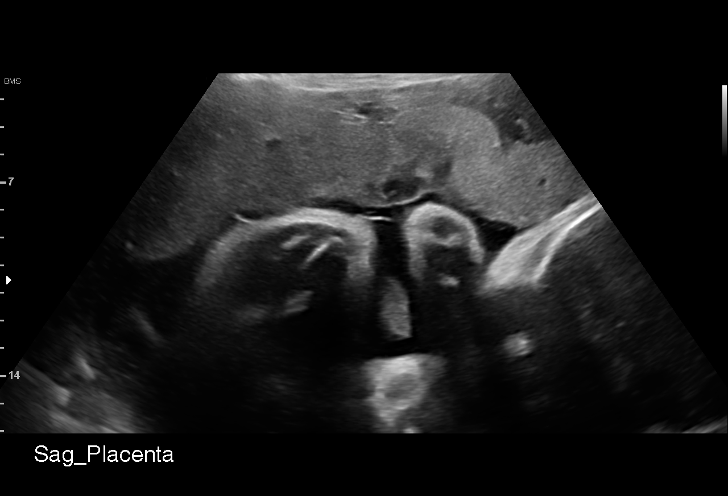
[im 5/31]
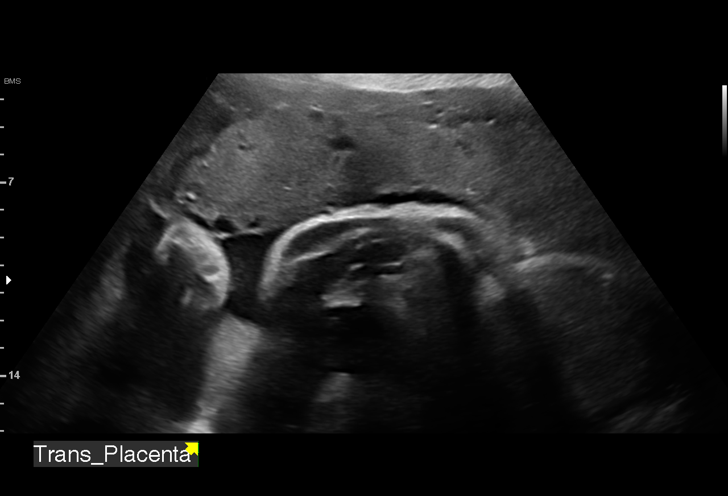
[im 7/31]
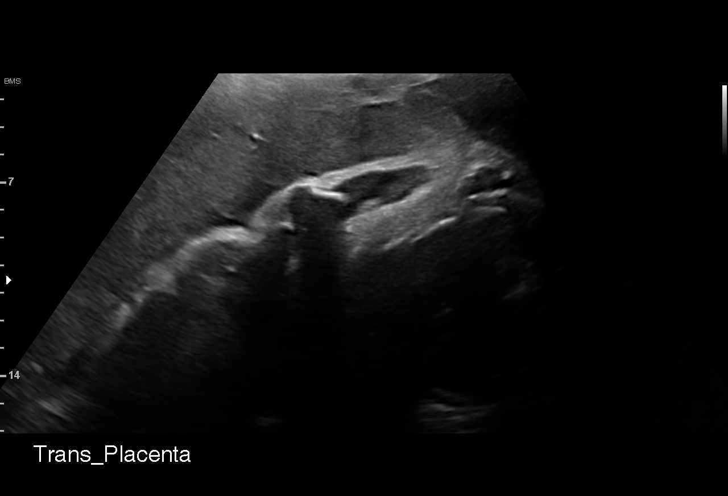
[im 9/31]
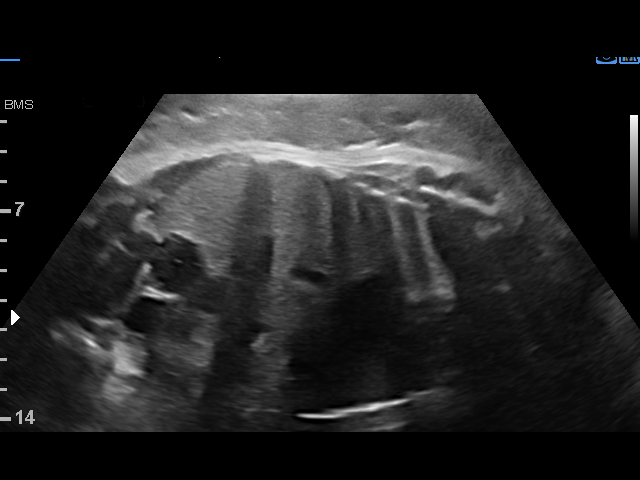
[im 12/31]
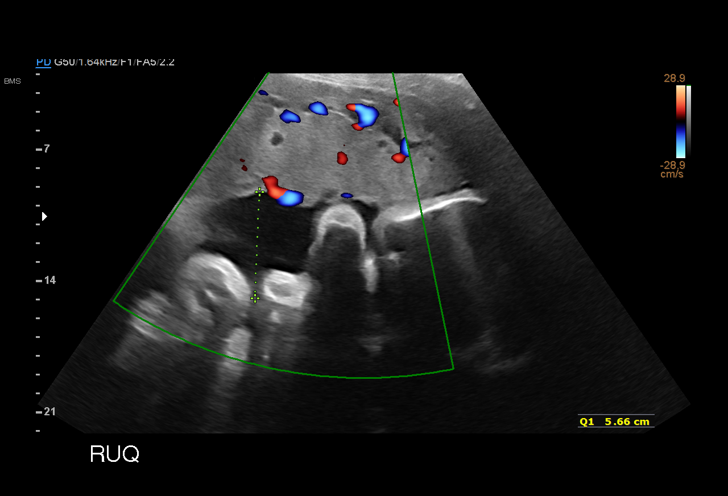
[im 14/31]
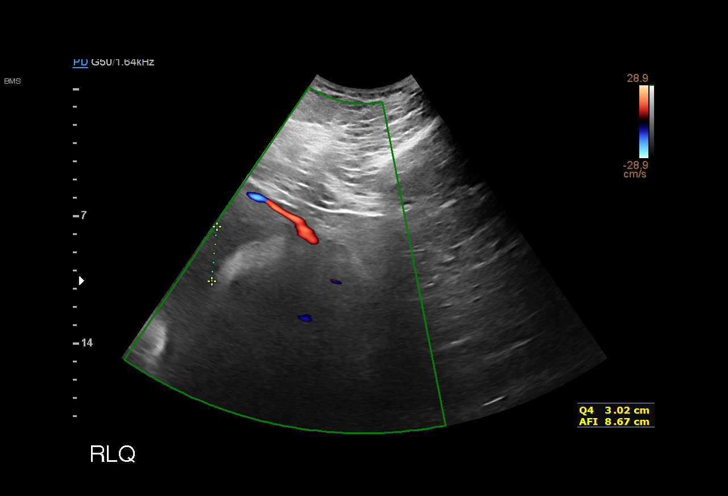
[im 16/31]
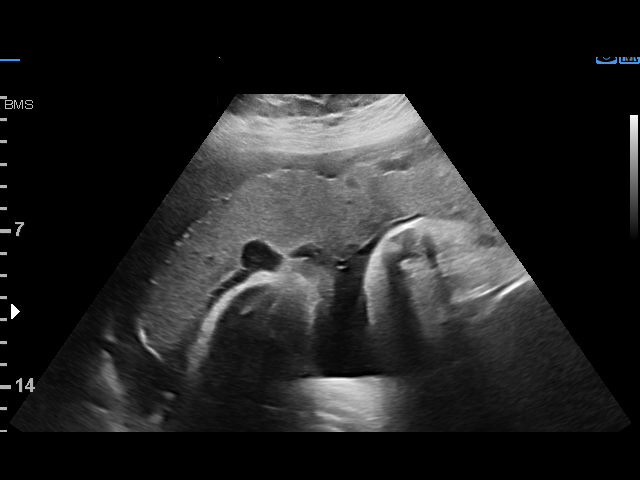
[im 17/31]
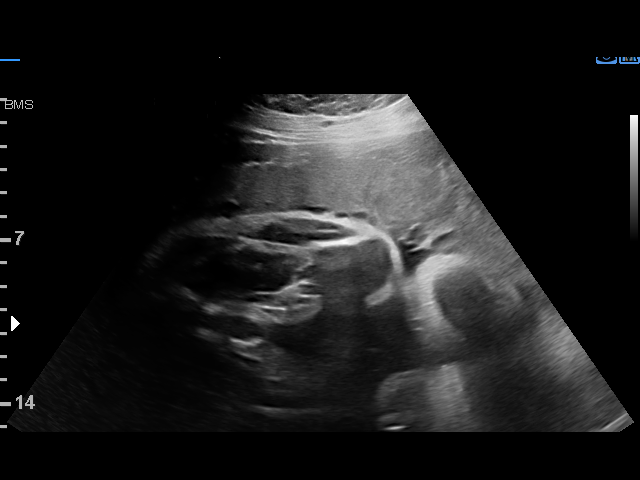
[im 19/31]
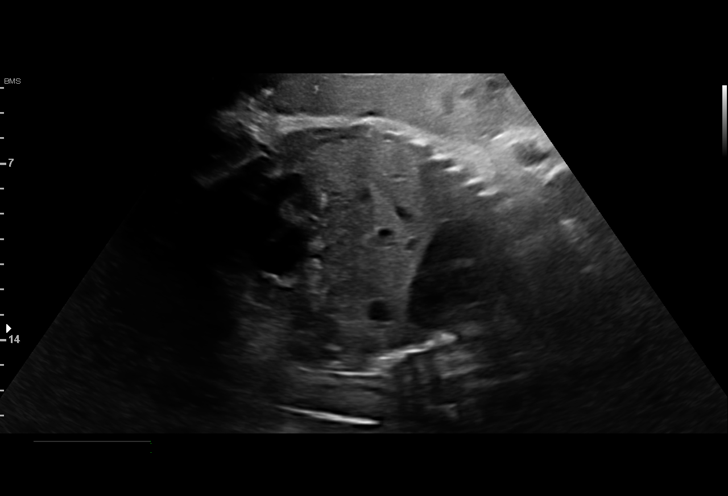
[im 22/31]
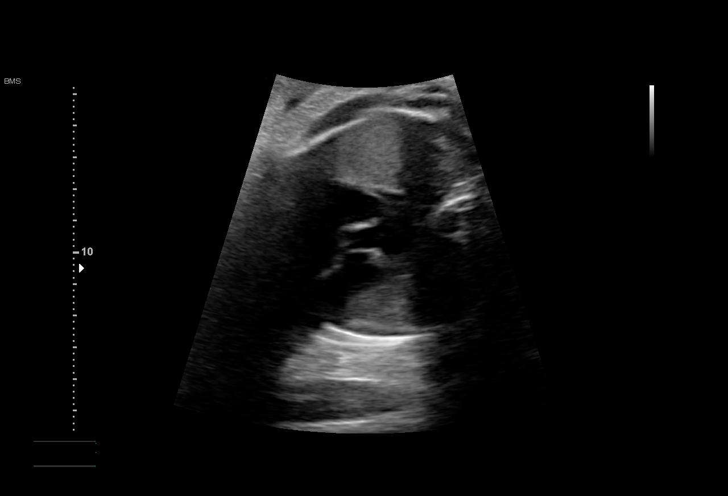
[im 24/31]
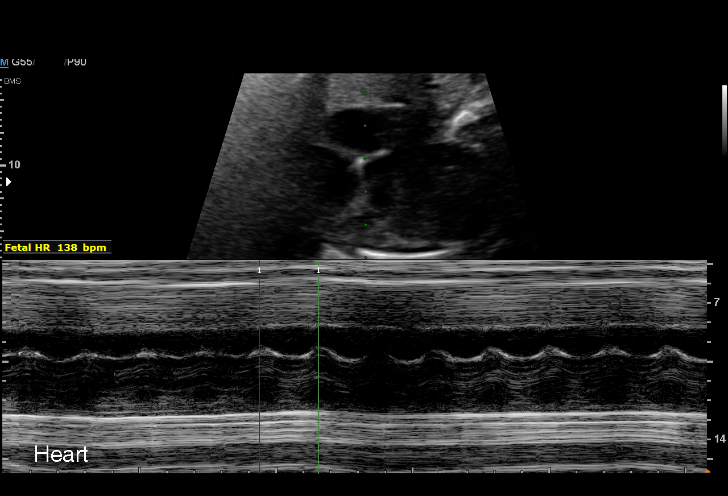
[im 26/31]
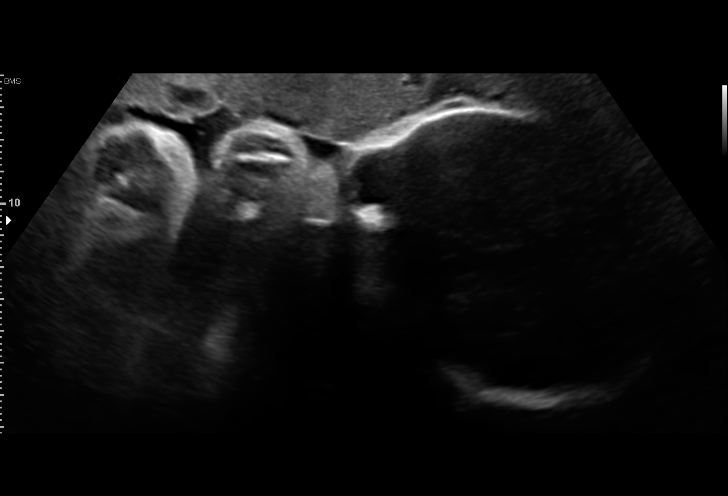
[im 28/31]
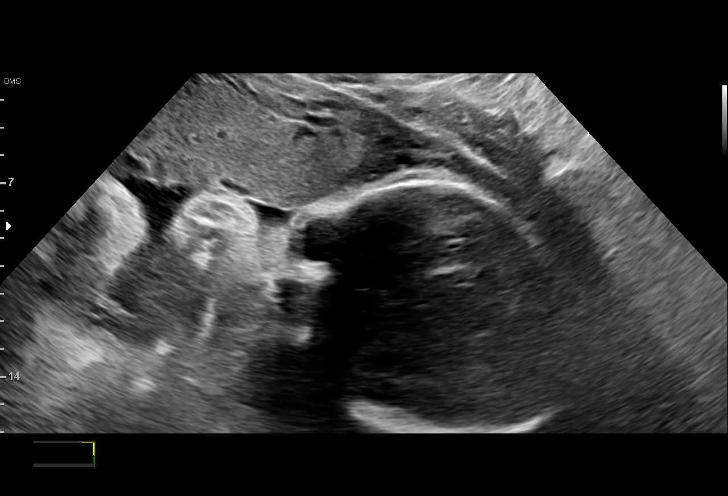
[im 31/31]
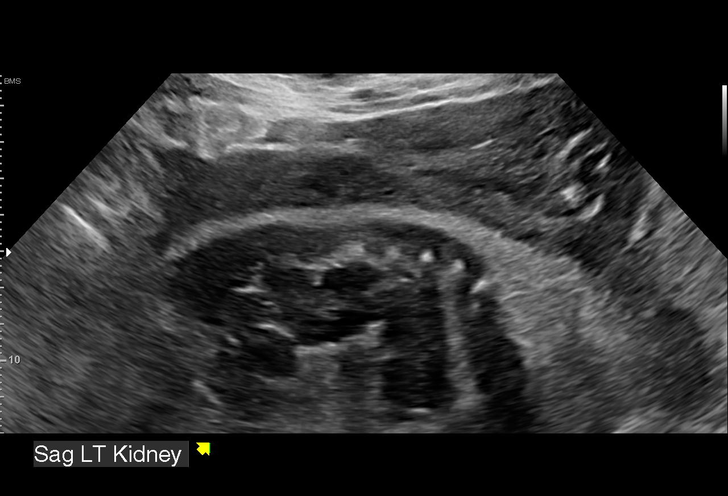

[15 of 28 positions shown; findings below may reference images not displayed]

Indications

 Obesity complicating pregnancy, second
 trimester (BMI 51)
 History of cesarean delivery, currently
 pregnant
 Anxiety during pregnancy, second trimester     O99.342,
 Anemia during pregnancy in second trimester
 LR NIPS/ Negative Horizon/ Negative AFP
 37 weeks gestation of pregnancy
Fetal Evaluation

 Num Of Fetuses:         1
 Fetal Heart Rate(bpm):  138
 Cardiac Activity:       Observed
 Presentation:           Cephalic
 Placenta:               Anterior
 P. Cord Insertion:      Previously Visualized

 Amniotic Fluid
 AFI FV:      Within normal limits

 AFI Sum(cm)     %Tile       Largest Pocket(cm)
 8.68            15

 RUQ(cm)       RLQ(cm)       LUQ(cm)        LLQ(cm)
 5.66          3.02          0              0
Biophysical Evaluation

 Amniotic F.V:   Within normal limits       F. Tone:        Observed
 F. Movement:    Observed                   Score:          [DATE]
 F. Breathing:   Observed
Biometry

 LV:        2.4  mm
OB History

 Blood Type:   AB+
 Gravidity:    2         Term:   1        Prem:   0        SAB:   0
 TOP:          0       Ectopic:  0        Living: 1
Gestational Age

 LMP:           37w 5d        Date:  12/29/20                 EDD:   10/05/21
 Best:          37w 5d     Det. By:  LMP  (12/29/20)          EDD:   10/05/21
Anatomy

 Ventricles:            Appears normal         Kidneys:                Appear normal
 Diaphragm:             Appears normal         Bladder:                Appears normal
 Stomach:               Appears normal, left
                        sided
Cervix Uterus Adnexa

 Cervix
 Not visualized (advanced GA >75wks)
Comments

 This patient was seen for a biophysical profile due to
 maternal obesity with a BMI of 51.  She denies any problems
 since her last exam.
 A biophysical profile performed today was [DATE].
 There was normal amniotic fluid noted on today's ultrasound
 exam.
 She will return in 1 week for another biophysical profile.
 Labor precautions were reviewed.

## 2023-07-08 MED ORDER — HYDROXYZINE HCL 25 MG PO TABS
25.0000 mg | ORAL_TABLET | Freq: Three times a day (TID) | ORAL | 1 refills | Status: DC | PRN
Start: 2023-07-08 — End: 2023-11-12

## 2023-07-09 IMAGING — DX DG CHEST 1V PORT
1 series · 1 of 1 positions shown · non-contrast
Comparison: 03/16/2020

CLINICAL DATA: Shortness of breath

EXAM:
PORTABLE CHEST 1 VIEW

[chest ap]
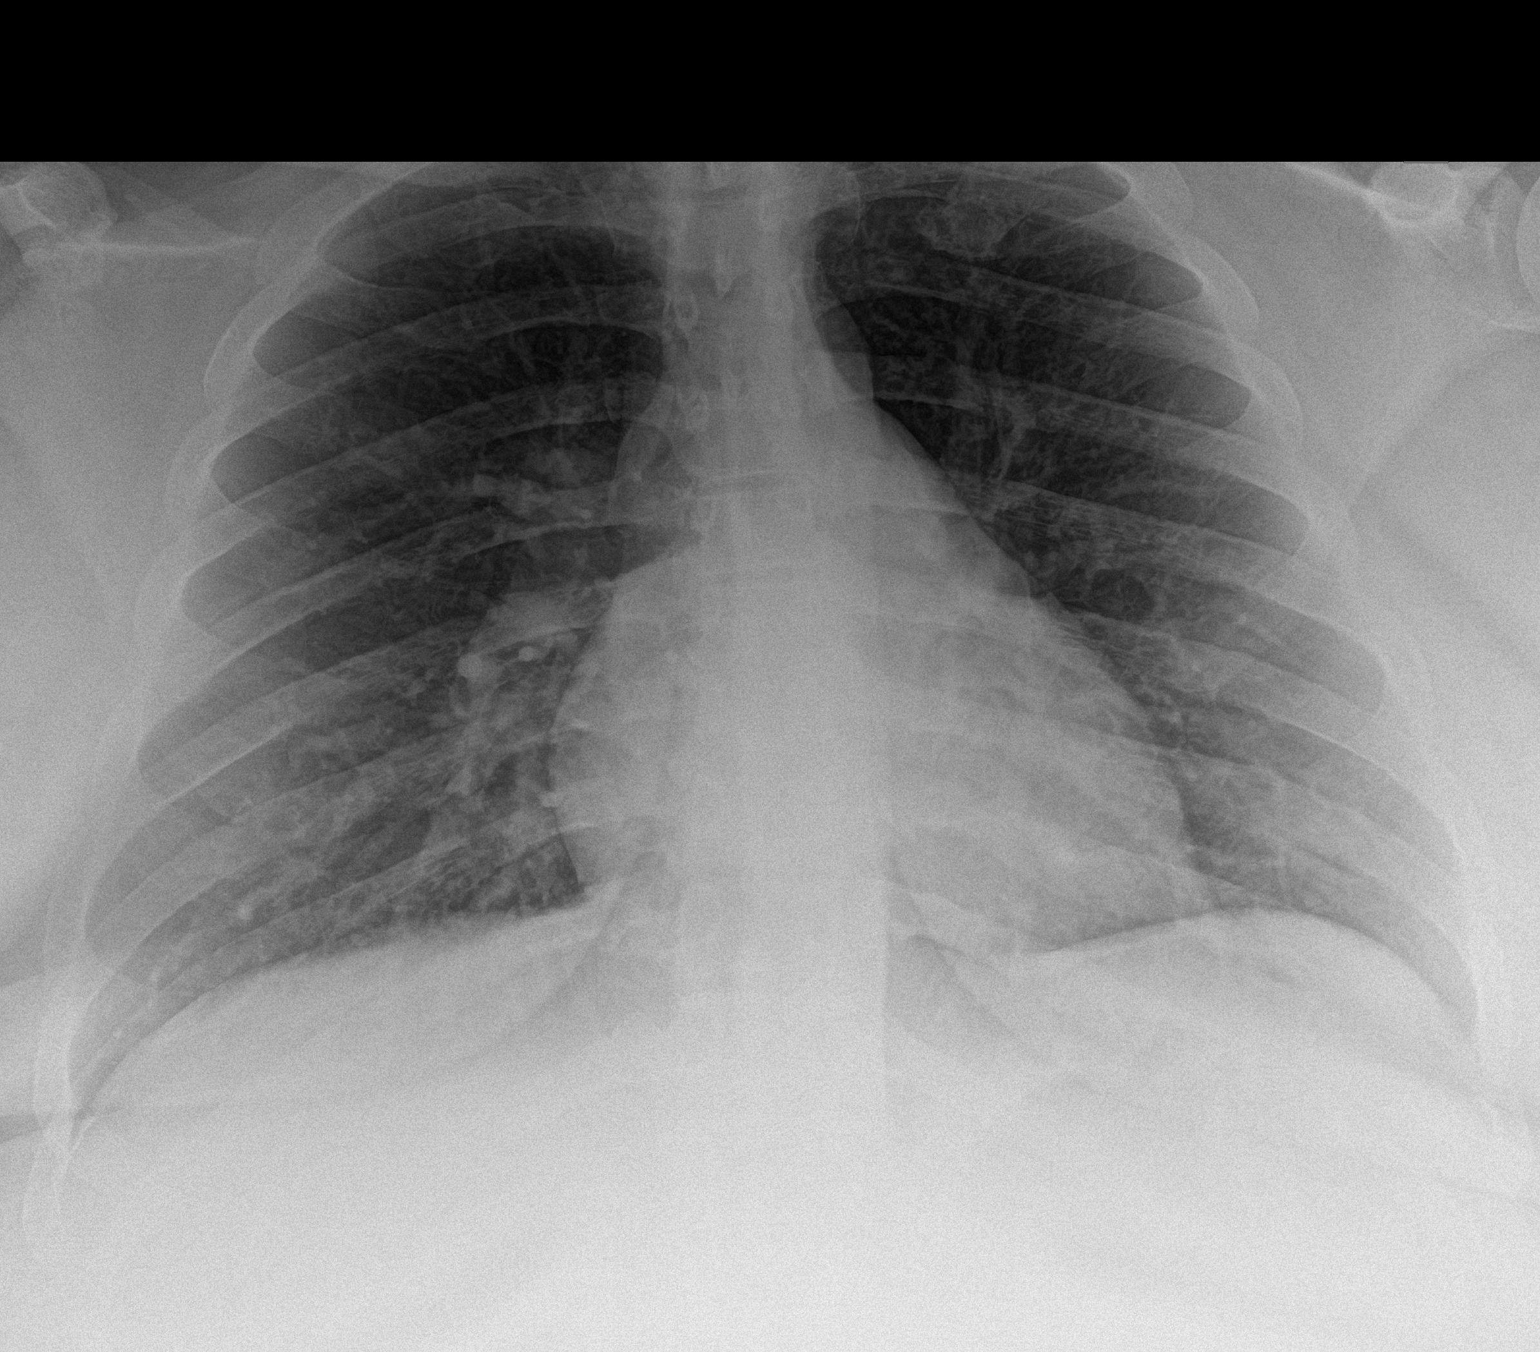

[1 of 1 positions shown; findings below may reference images not displayed]

FINDINGS: Lungs are clear.  No pleural effusion or pneumothorax.

The heart is normal in size.
IMPRESSION: No evidence of acute cardiopulmonary disease.

## 2023-07-11 ENCOUNTER — Encounter (HOSPITAL_COMMUNITY): Payer: Self-pay | Admitting: *Deleted

## 2023-07-11 ENCOUNTER — Ambulatory Visit (HOSPITAL_COMMUNITY)
Admission: EM | Admit: 2023-07-11 | Discharge: 2023-07-11 | Disposition: A | Discharge status: Home / Self Care | Payer: Medicaid Other | Source: Home / Self Care

## 2023-07-11 ENCOUNTER — Other Ambulatory Visit: Payer: Self-pay

## 2023-07-11 DIAGNOSIS — J029 Acute pharyngitis, unspecified: Secondary | ICD-10-CM

## 2023-07-11 DIAGNOSIS — H9203 Otalgia, bilateral: Secondary | ICD-10-CM

## 2023-07-11 LAB — POCT RAPID STREP A (OFFICE): Rapid Strep A Screen: NEGATIVE

## 2023-07-11 MED ORDER — LIDOCAINE VISCOUS HCL 2 % MT SOLN: 15.0000 mL | OROMUCOSAL | 0 refills | Status: DC | PRN

## 2023-07-11 MED ORDER — IPRATROPIUM BROMIDE 0.03 % NA SOLN: 2.0000 | Freq: Two times a day (BID) | NASAL | 12 refills | Status: DC

## 2023-07-11 MED ORDER — CIPROFLOXACIN-DEXAMETHASONE 0.3-0.1 % OT SUSP: 4.0000 [drp] | Freq: Two times a day (BID) | OTIC | 0 refills | Status: DC

## 2023-07-11 NOTE — Discharge Instructions (Signed)
Your symptoms today are most likely being caused by a virus and should steadily improve in time it can take up to 7 to 10 days before you truly start to see a turnaround however things will get better  On exam there is no signs of infection to the ear, after attempt of supportive treatment if no improvement seen you may start use of Ciprodex drops which is an antibiotic, Place 4 drops into both ears every morning and every evening for 7 days  Attempt use of Atrovent nasal spray every morning and every evening  Attempted use of lidocaine mouth rinse, gargle and spit every 4 hours to provide temporary relief to the throat Rapid strep test this negative for bacteria    You can take Tylenol and/or Ibuprofen as needed for fever reduction and pain relief.   For sore throat: try warm salt water gargles, cepacol lozenges, throat spray, warm tea or water with lemon/honey, popsicles or ice, or OTC cold relief medicine for throat discomfort.   It is important to stay hydrated: drink plenty of fluids (water, gatorade/powerade/pedialyte, juices, or teas) to keep your throat moisturized and help further relieve irritation/discomfort.

## 2023-07-11 NOTE — ED Provider Notes (Signed)
[facility]    CSN: [id number] Arrival date & time: 07/11/23  1530      History   Chief Complaint Chief Complaint  Patient presents with   Otalgia   Sore Throat    HPI Moana Munford Menees is a 32 y.o. female.   Patient presents for evaluation of sore throat and bilateral ear pain beginning 1 day ago.  Symptoms exacerbated when swallowing.  No known sick contacts.  Tolerating food and liquids.  Denies presence of fever chills congestion cough.  Has not attempted treatment.   Past Medical History:  Diagnosis Date   Abdominal pain 01/06/2012   Acute tension headache 07/27/2019   Anemia    Ankle pain    Anxiety    Asthma    Birth control counseling 08/26/2011   BMI 40.0-44.9, adult (HCC) 02/27/2011   Dysuria 01/13/2013   Epigastric pain 09/13/2019   GERD (gastroesophageal reflux disease)    Heart murmur    Hiatal hernia    Obesity (BMI 30-39.9)    Pre-eclampsia    off med since 4/23   Secondary amenorrhea 03/25/2018   Trichomonas infection 06/17/2022    Patient Active Problem List   Diagnosis Date Noted   Constipation 05/17/2023   Nexplanon insertion 05/19/2022   Nocturnal oxygen desaturation 03/23/2022   Gallstones    Allergic rhinitis 03/07/2022   Asthma 08/31/2020   Back pain 02/13/2020   Hiatal hernia 11/02/2019   Subclinical hypothyroidism 05/19/2019   GAD (generalized anxiety disorder) 05/09/2019   Nonintractable episodic headache 11/15/2018   Seasonal allergies 03/25/2018   Chronic cough 10/16/2017   Fatigue 05/09/2016   GERD (gastroesophageal reflux disease) 03/28/2011   BMI 40.0-44.9, adult (HCC) 02/27/2011    Past Surgical History:  Procedure Laterality Date   CESAREAN SECTION  09/02/2010   For macrosomia, but son was 7 lb 12 oz   CESAREAN SECTION N/A 10/06/2021   Procedure: CESAREAN SECTION;  Surgeon: Matawan Bing, MD;  Location: [facility];  Service: Obstetrics;  Laterality: N/A;   CHOLECYSTECTOMY N/A 06/16/2022   Procedure:  LAPAROSCOPIC  CHOLECYSTECTOMY;  Surgeon: Berna Bue, MD;  Location: [facility];  Service: General;  Laterality: N/A;   TONSILLECTOMY AND ADENOIDECTOMY      OB History     Gravida  2   Para  2   Term  2   Preterm      AB      Living  2      SAB      IAB      Ectopic      Multiple  0   Live Births  2            Home Medications    Prior to Admission medications   Medication Sig Start Date End Date Taking? Authorizing Provider  albuterol (VENTOLIN HFA) 108 (90 Base) MCG/ACT inhaler INHALE 2 PUFFS BY MOUTH EVERY 4 HOURS AS NEEDED FOR WHEEZING OR SHORTNESS OF BREATH (COUGH). 01/30/23  Yes Raspet, Erin K, [name]  baclofen (LIORESAL) 10 MG tablet Take 1 tablet (10 mg total) by mouth at bedtime as needed for muscle spasms. 05/14/23  Yes Raspet, Erin K, [name]  escitalopram (LEXAPRO) 10 MG tablet Take 1 tablet (10 mg total) by mouth daily. 06/15/23  Yes Sowell, Apolinar Junes, MD  etonogestrel (NEXPLANON) 68 MG IMPL implant 1 each by Subdermal route once.   Yes [provider]  famotidine (PEPCID) 20 MG tablet Take 1 tablet by mouth once daily 02/17/23  Yes  Cora Collum, DO  Ferrous Sulfate (IRON) 325 (65 Fe) MG TABS TAKE 1 TABLET BY MOUTH EVERY OTHER DAY 05/20/23  Yes Paige, Victoria J, DO  fluticasone (FLONASE) 50 MCG/ACT nasal spray Place 2 sprays into both nostrils daily. 04/06/23  Yes Paige, Turkey J, DO  fluticasone (FLOVENT HFA) 110 MCG/ACT inhaler Inhale 2 puffs into the lungs 2 (two) times daily. 05/05/23  Yes [name], DO  hydrOXYzine (ATARAX) 25 MG tablet Take 1 tablet (25 mg total) by mouth 3 (three) times daily as needed for anxiety. 07/08/23  Yes Westley Chandler, MD  Multiple Vitamins-Minerals (ONE-A-DAY WOMENS PO) Take 1 tablet by mouth daily.   Yes [provider]  pantoprazole (PROTONIX) 40 MG tablet Take 1 tablet (40 mg total) by mouth daily. 05/20/23  Yes Paige, Turkey J, DO  polyethylene glycol (MIRALAX / GLYCOLAX) 17 g packet Take 17 g by  mouth 2 (two) times daily. 05/15/23  Yes Valetta Close, MD  senna (SENOKOT) 8.6 MG TABS tablet Take 1 tablet (8.6 mg total) by mouth 2 (two) times daily. 05/15/23  Yes Valetta Close, MD  loratadine (CLARITIN) 10 MG tablet Take 1 tablet (10 mg total) by mouth daily. 05/22/23   Cora Collum, DO    Family History Family History  Problem Relation Age of Onset   Diabetes Mother    Hypertension Mother    Obesity Mother    Heart attack Mother 78       Death   Obesity Sister    Osteochondroma Sister    Diabetes Sister    Hypertension Sister    Esophageal cancer Neg Hx    Colon cancer Neg Hx    Liver disease Neg Hx     Social History Social History   Tobacco Use   Smoking status: Never   Smokeless tobacco: Never  Vaping Use   Vaping status: Never Used  Substance Use Topics   Alcohol use: Yes    Comment: occ   Drug use: Not Currently    Comment: last used in teens     Allergies   Morphine, Ibuprofen, and Morphine and codeine   Review of Systems Review of Systems  Constitutional: Negative.   HENT:  Positive for ear pain and sore throat. Negative for congestion, dental problem, drooling, ear discharge, facial swelling, hearing loss, mouth sores, nosebleeds, postnasal drip, rhinorrhea, sinus pressure, sinus pain, sneezing, tinnitus, trouble swallowing and voice change.   Respiratory: Negative.    Cardiovascular: Negative.   Gastrointestinal: Negative.      Physical Exam Triage Vital Signs ED Triage Vitals  Encounter Vitals Group     BP 07/11/23 1618 118/76     Systolic BP Percentile --      Diastolic BP Percentile --      Pulse Rate 07/11/23 1618 (!) 59     Resp 07/11/23 1618 18     Temp 07/11/23 1618 98.2 F (36.8 C)     Temp src --      SpO2 07/11/23 1618 98 %     Weight --      Height --      Head Circumference --      Peak Flow --      Pain Score 07/11/23 1615 6     Pain Loc --      Pain Education --      Exclude from Growth Chart --    No data  found.  Updated Vital Signs BP 118/76  Pulse (!) 59   Temp 98.2 F (36.8 C)   Resp 18   LMP 07/11/2023   SpO2 98%   Visual Acuity Right Eye Distance:   Left Eye Distance:   Bilateral Distance:    Right Eye Near:   Left Eye Near:    Bilateral Near:     Physical Exam Constitutional:      Appearance: She is well-developed.  HENT:     Head: Normocephalic.     Right Ear: Tympanic membrane and ear canal normal.     Left Ear: Tympanic membrane and ear canal normal.     Nose: No congestion or rhinorrhea.     Mouth/Throat:     Mouth: Mucous membranes are moist.     Pharynx: No posterior oropharyngeal erythema.     Tonsils: No tonsillar exudate. 0 on the right. 0 on the left.  Pulmonary:     Effort: Pulmonary effort is normal.  Skin:    General: Skin is warm and dry.  Neurological:     Mental Status: She is alert.      UC Treatments / Results  Labs (all labs ordered are listed, but only abnormal results are displayed) Labs Reviewed  POCT RAPID STREP A (OFFICE)    EKG   Radiology No results found.  Procedures Procedures (including critical care time)  Medications Ordered in UC Medications - No data to display  Initial Impression / Assessment and Plan / UC Course  I have reviewed the triage vital signs and the nursing notes.  Pertinent labs & imaging results that were available during my care of the patient were reviewed by me and considered in my medical decision making (see chart for details).  Otalgia of both ears, sore throat  Vitals are stable, Patient is in no signs of distress nontoxic-appearing, no erythema, tonsillar adenopathy or exudate present on exam, rapid strep test negative, no abnormalities to the bilateral ears, discussed this with patient, etiology most likely viral, advised to monitor, prescribed Atrovent nasal spray and viscous lidocaine for supportive care, recommend over-the-counter analgesics, warm compresses to the external ear and  advised against ear cleaning, Ciprodex prescribed as a watchful wait if symptoms continue to persist or worsen, may follow-up with urgent care as needed for any concerns Final Clinical Impressions(s) / UC Diagnoses   Final diagnoses:  None   Discharge Instructions   None    ED Prescriptions   None    PDMP not reviewed this encounter.   Valinda Hoar, NP 07/11/23 1710

## 2023-07-11 NOTE — ED Triage Notes (Signed)
Pt reports sore throat and bil. Ear pain. Pain worse when Pt swallows. Pain started yesterday.

## 2023-07-15 MED ORDER — BLOOD PRESSURE KIT: PACK | 0 refills | Status: AC

## 2023-07-15 NOTE — Telephone Encounter (Signed)
Order signed.  Terisa Starr, MD  Family Medicine Teaching Service

## 2023-07-15 NOTE — Telephone Encounter (Signed)
Called patient and advised that machine had been sent to [facility] pharmacy.   Patient appreciative.   Veronda Prude, RN

## 2023-07-16 DIAGNOSIS — I10 Essential (primary) hypertension: Secondary | ICD-10-CM | POA: Diagnosis not present

## 2023-07-29 ENCOUNTER — Other Ambulatory Visit: Payer: Self-pay | Admitting: *Deleted

## 2023-07-29 DIAGNOSIS — D649 Anemia, unspecified: Secondary | ICD-10-CM

## 2023-07-29 MED ORDER — IRON 325 (65 FE) MG PO TABS
1.0000 | ORAL_TABLET | ORAL | 0 refills | Status: DC
Start: 2023-07-29 — End: 2023-09-25

## 2023-08-17 ENCOUNTER — Encounter: Payer: Self-pay | Admitting: Nurse Practitioner

## 2023-08-17 ENCOUNTER — Ambulatory Visit (INDEPENDENT_AMBULATORY_CARE_PROVIDER_SITE_OTHER): Payer: Medicaid Other | Admitting: Nurse Practitioner

## 2023-08-17 ENCOUNTER — Ambulatory Visit: Payer: Medicaid Other | Admitting: Nurse Practitioner

## 2023-08-17 VITALS — BP 118/80 | HR 79 | Ht 62.0 in | Wt 244.0 lb

## 2023-08-17 DIAGNOSIS — K219 Gastro-esophageal reflux disease without esophagitis: Secondary | ICD-10-CM

## 2023-08-17 MED ORDER — PANTOPRAZOLE SODIUM 20 MG PO TBEC: 20.0000 mg | DELAYED_RELEASE_TABLET | Freq: Every morning | ORAL | 5 refills | Status: DC

## 2023-08-17 NOTE — Patient Instructions (Addendum)
Decrease Omeprazole 20 mg 30 minutes before breakfast, Can resume to 40 mg if not resolved.  We have scheduled you for a follow up appointment with Dr.Pyrtle for 12/16/22 at 9:10.  Acid Reflux  Below are some measures you can take to possibly improve acid reflux symptoms . We may have discussed some of these today in the office. Not everything on this list may apply to you   --If you are taking anti-reflux ( GERD) medication be sure to take it 30 minutes before breakfast and if taking twice daily then also second dose should be 30 minutes before dinner.   --Avoid late meals / bedtime snacks.   --Avoid trigger foods ( foods which you know tend to aggravate you reflux symptoms). Some common trigger foods include spicy foods, fatty foods, acidic foods, chocolate and caffeine.  --Elevate the head of bed 6-8 inches on blocks or bricks. If not able to elevate the head of the bed consider purchasing a wedge pillow to sleep on.    --Weight reduction / maintain a healthy BMI ( body mass index) may be help with reflux symptoms  --Sometimes with the above mentioned "lifestyle changes" patients are able to reduce the amount of GERD medications they take. Our goal is to have you on the lowest effective dose of medication

## 2023-08-17 NOTE — Progress Notes (Unsigned)
ASSESSMENT & PLAN   32 y.o.  female known to Dr. Rhea Belton  GERD --Will try to decrease Pantoprazole back to 20 mg daily not that she is trying to follow anti relfux meausea and  --Can resume 40 mg pantoprazole if GERD symptoms recu --Anti-reflux measures discussed again. Will try to do a better job gong to bed on empty stomach. Needs wedge pillow or elevate HOB since coughing at night.   History of ***.   See PMH below for additional history  HPI   Brief GI history      Chief complaint :         Procedure risk assessment:  No history of CHF.  No supplemental 02 use at home.  Not a known difficult airway Anticoagulant:     Previous GI Endoscopies / Labs / Imaging   **May not include all endoscopic evaluations         Latest Ref Rng & Units 05/09/2023    9:40 AM 03/25/2022    3:23 PM 03/10/2022    8:30 AM  Hepatic Function  Total Protein 6.5 - 8.1 g/dL 7.1  7.7  6.4   Albumin 3.5 - 5.0 g/dL 3.7  4.1  2.7   AST 15 - 41 U/L 10  12  11    ALT 0 - 44 U/L 14  19  16    Alk Phosphatase 38 - 126 U/L 55  93  76   Total Bilirubin 0.3 - 1.2 mg/dL 0.6  0.3  0.7   Bilirubin, Direct 0.0 - 0.2 mg/dL   0.2        Latest Ref Rng & Units 05/12/2023    8:23 PM 05/09/2023    9:40 AM 03/16/2023    5:23 PM  CBC  WBC 4.0 - 10.5 K/uL 7.0  6.0  7.0   Hemoglobin 12.0 - 15.0 g/dL 16.1  09.6  04.5   Hematocrit 36.0 - 46.0 % 38.5  37.8  37.3   Platelets 150 - 400 K/uL 349  313  321      Past Medical History:  Diagnosis Date   Abdominal pain 01/06/2012   Acute tension headache 07/27/2019   Anemia    Ankle pain    Anxiety    Asthma    Birth control counseling 08/26/2011   BMI 40.0-44.9, adult (HCC) 02/27/2011   Dysuria 01/13/2013   Epigastric pain 09/13/2019   GERD (gastroesophageal reflux disease)    Heart murmur    Hiatal hernia    Obesity (BMI 30-39.9)    Pre-eclampsia    off med since 4/23   Secondary amenorrhea 03/25/2018   Trichomonas infection 06/17/2022     Past Surgical History:  Procedure Laterality Date   CESAREAN SECTION  09/02/2010   For macrosomia, but son was 7 lb 12 oz   CESAREAN SECTION N/A 10/06/2021   Procedure: CESAREAN SECTION;  Surgeon: DeQuincy Bing, MD;  Location: [facility] ORS;  Service: Obstetrics;  Laterality: N/A;   CHOLECYSTECTOMY N/A 06/16/2022   Procedure: LAPAROSCOPIC  CHOLECYSTECTOMY;  Surgeon: Berna Bue, MD;  Location: [facility] ORS;  Service: General;  Laterality: N/A;   TONSILLECTOMY AND ADENOIDECTOMY      Family History  Problem Relation Age of Onset   Diabetes Mother    Hypertension Mother    Obesity Mother    Heart attack Mother 44       Death   Obesity Sister    Osteochondroma Sister    Diabetes Sister  Hypertension Sister    Esophageal cancer Neg Hx    Colon cancer Neg Hx    Liver disease Neg Hx     Current Medications, Allergies, Family History and Social History were reviewed in [employer] record.     Current Outpatient Medications  Medication Sig Dispense Refill   albuterol (VENTOLIN HFA) 108 (90 Base) MCG/ACT inhaler INHALE 2 PUFFS BY MOUTH EVERY 4 HOURS AS NEEDED FOR WHEEZING OR SHORTNESS OF BREATH (COUGH). 18 g 0   baclofen (LIORESAL) 10 MG tablet Take 1 tablet (10 mg total) by mouth at bedtime as needed for muscle spasms. 10 each 0   Blood Pressure KIT Please use to check blood pressure as needed. 1 kit 0   ciprofloxacin-dexamethasone (CIPRODEX) OTIC suspension Place 4 drops into both ears 2 (two) times daily. 7.5 mL 0   escitalopram (LEXAPRO) 10 MG tablet Take 1 tablet (10 mg total) by mouth daily. 30 tablet 2   etonogestrel (NEXPLANON) 68 MG IMPL implant 1 each by Subdermal route once.     famotidine (PEPCID) 20 MG tablet Take 1 tablet by mouth once daily 30 tablet 0   Ferrous Sulfate (IRON) 325 (65 Fe) MG TABS Take 1 tablet (325 mg total) by mouth every other day. 30 tablet 0   fluticasone (FLONASE) 50 MCG/ACT nasal spray Place 2 sprays into both nostrils  daily. 16 g 6   fluticasone (FLOVENT HFA) 110 MCG/ACT inhaler Inhale 2 puffs into the lungs 2 (two) times daily. 1 each 5   hydrOXYzine (ATARAX) 25 MG tablet Take 1 tablet (25 mg total) by mouth 3 (three) times daily as needed for anxiety. 90 tablet 1   ipratropium (ATROVENT) 0.03 % nasal spray Place 2 sprays into both nostrils every 12 (twelve) hours. 30 mL 12   lidocaine (XYLOCAINE) 2 % solution Use as directed 15 mLs in the mouth or throat every 4 (four) hours as needed for mouth pain. 100 mL 0   loratadine (CLARITIN) 10 MG tablet Take 1 tablet (10 mg total) by mouth daily. 30 tablet 11   Multiple Vitamins-Minerals (ONE-A-DAY WOMENS PO) Take 1 tablet by mouth daily.     pantoprazole (PROTONIX) 40 MG tablet Take 1 tablet (40 mg total) by mouth daily. 30 tablet 3   polyethylene glycol (MIRALAX / GLYCOLAX) 17 g packet Take 17 g by mouth 2 (two) times daily. 14 each 1   senna (SENOKOT) 8.6 MG TABS tablet Take 1 tablet (8.6 mg total) by mouth 2 (two) times daily. 120 tablet 0   No current facility-administered medications for this visit.    Review of Systems: No chest pain. No shortness of breath. No urinary complaints.    Physical Exam  Wt Readings from Last 3 Encounters:  08/17/23 244 lb (110.7 kg)  06/29/23 243 lb 12.8 oz (110.6 kg)  06/04/23 242 lb (109.8 kg)    BP 118/80   Pulse 79   Ht 5\' 2"  (1.575 m)   Wt 244 lb (110.7 kg)   BMI 44.63 kg/m  Constitutional:  Pleasant, generally well appearing ***female in no acute distress. Psychiatric: Normal mood and affect. Behavior is normal. EENT: Pupils normal.  Conjunctivae are normal. No scleral icterus. Neck supple.  Cardiovascular: Normal rate, regular rhythm.  Pulmonary/chest: Effort normal and breath sounds normal. No wheezing, rales or rhonchi. Abdominal: Soft, nondistended, nontender. Bowel sounds active throughout. There are no masses palpable. No hepatomegaly. Neurological: Alert and oriented to person place and time.   Extremities: *** edema  Willette Cluster, NP  08/17/2023, 3:19 PM  Cc:  Bess Kinds, MD

## 2023-08-18 ENCOUNTER — Encounter: Payer: Self-pay | Admitting: Nurse Practitioner

## 2023-08-24 NOTE — Progress Notes (Signed)
Addendum: Reviewed and agree with assessment and management plan. Kadijah Shamoon M, MD  

## 2023-09-25 ENCOUNTER — Other Ambulatory Visit: Payer: Self-pay | Admitting: Student

## 2023-09-25 DIAGNOSIS — D649 Anemia, unspecified: Secondary | ICD-10-CM

## 2023-10-01 ENCOUNTER — Ambulatory Visit: Payer: Self-pay

## 2023-10-01 ENCOUNTER — Ambulatory Visit: Payer: Medicaid Other | Admitting: Family Medicine

## 2023-10-01 ENCOUNTER — Other Ambulatory Visit: Payer: Self-pay

## 2023-10-01 ENCOUNTER — Other Ambulatory Visit (HOSPITAL_COMMUNITY)
Admission: RE | Admit: 2023-10-01 | Discharge: 2023-10-01 | Disposition: A | Discharge status: Home / Self Care | Payer: Medicaid Other | Source: Ambulatory Visit | Attending: Family Medicine | Admitting: Family Medicine

## 2023-10-01 VITALS — BP 122/79 | HR 70 | Ht 62.0 in | Wt 243.6 lb

## 2023-10-01 DIAGNOSIS — M79601 Pain in right arm: Secondary | ICD-10-CM

## 2023-10-01 DIAGNOSIS — N898 Other specified noninflammatory disorders of vagina: Secondary | ICD-10-CM | POA: Insufficient documentation

## 2023-10-01 LAB — POCT UA - MICROSCOPIC ONLY
RBC, Urine, Miroscopic: NONE SEEN (ref 0–2)
WBC, Ur, HPF, POC: NONE SEEN (ref 0–5)

## 2023-10-01 LAB — POCT WET PREP (WET MOUNT)
Clue Cells Wet Prep Whiff POC: NEGATIVE
Trichomonas Wet Prep HPF POC: ABSENT
WBC, Wet Prep HPF POC: NONE SEEN

## 2023-10-01 LAB — POCT URINE PREGNANCY: Preg Test, Ur: NEGATIVE

## 2023-10-01 NOTE — Patient Instructions (Addendum)
Good to see you today - Thank you for coming in  Things we discussed today:  1) We collected a few labs to check what could be causing your vaginal symptoms.  2) For the pain in your right arm - We will get a ultrasound - Take tylenol or ibuprofen as needed for pain - You can apply ice to the area for 5 minutes 4 times a day   Please seek further medical attention if: - you have trouble breathing - the rest of your arm starts to swell or get painful - you get fevers of 100.18F or higher for 3 days in a row - you have vaginal bleeding

## 2023-10-01 NOTE — Progress Notes (Signed)
    SUBJECTIVE:   CHIEF COMPLAINT / HPI:   KM is a 32yo F w/ hx of asthma that p/f vaginal itching and R arm pain. - 3 day of vaginal burning and itching.  - Also having more urinary frequency - Has had unprotected intercourse with a new partner, unsure if this partner is having symptoms too. - Denies fevers, dysuria, vaginal discharge.   STD test - GC/Ch, trich/yeast/BV, RPR, HIV,  R arm pain - 1 wk hx of R arm pain near her elbow - no SOB -  no swelling or pain in rest of arm  OBJECTIVE:   BP 122/79   Pulse 70   Ht 5\' 2"  (1.575 m)   Wt 243 lb 9.6 oz (110.5 kg)   SpO2 100%   BMI 44.56 kg/m   General: Alert, pleasant woman. NAD. HEENT: NCAT. MMM. CV: RRR, no murmurs. Cap refill <2. Resp: CTAB, no wheezing or crackles. Normal WOB on RA.  Abm: Soft, nontender, nondistended. BS present. Skin: Warm, well perfused  Ext: Tenderness of R antecubital fossa with palpable vessel underneath. No overlying skin changes. Radial pulses 2+. Strength grossly intact in BL arms. Normal ROM of R elbow.  GU: No discharge noted in vaginal vault.  ASSESSMENT/PLAN:   Assessment & Plan Vaginal irritation Testing was negative for GC, chlamydia, trichomoniasis, BV, yeast, UTI, HIV, RPR.  See 10/29 note regarding empiric treatment for yeast infection with fluconazole. Right arm pain Most likely MSK muscle strain or other injury.  However given palpable vessel in the area, will get ultrasound for further workup.  However DVT is less likely given no risk factors, rest of arm does not look swollen or painful, and no shortness of breath.  Discussed return precautions.   Lincoln Brigham, MD [facility]

## 2023-10-02 ENCOUNTER — Emergency Department (HOSPITAL_COMMUNITY)
Admission: EM | Admit: 2023-10-02 | Discharge: 2023-10-03 | Disposition: A | Discharge status: Home / Self Care | Payer: Medicaid Other | Attending: Emergency Medicine | Admitting: Emergency Medicine

## 2023-10-02 ENCOUNTER — Other Ambulatory Visit: Payer: Self-pay

## 2023-10-02 DIAGNOSIS — M79601 Pain in right arm: Secondary | ICD-10-CM | POA: Insufficient documentation

## 2023-10-02 LAB — RPR: RPR Ser Ql: NONREACTIVE

## 2023-10-02 LAB — HIV ANTIBODY (ROUTINE TESTING W REFLEX): HIV Screen 4th Generation wRfx: NONREACTIVE

## 2023-10-02 NOTE — ED Triage Notes (Signed)
Patient reports right arm pain , "veins poking out" onset last week , denies injury/no swelling .

## 2023-10-03 ENCOUNTER — Ambulatory Visit (HOSPITAL_COMMUNITY)
Admission: RE | Admit: 2023-10-03 | Discharge: 2023-10-03 | Disposition: A | Discharge status: Home / Self Care | Payer: Medicaid Other | Source: Ambulatory Visit | Attending: Emergency Medicine | Admitting: Emergency Medicine

## 2023-10-03 DIAGNOSIS — M79601 Pain in right arm: Secondary | ICD-10-CM | POA: Insufficient documentation

## 2023-10-03 DIAGNOSIS — M79602 Pain in left arm: Secondary | ICD-10-CM | POA: Insufficient documentation

## 2023-10-03 NOTE — Discharge Instructions (Signed)
You were seen in the ER today for your arm pain. Please see the attached instructions for returning for your ultrasound today.  You may take ibuprofen 400- 600 mg every 6 hours as needed for your discomfort.  You may ice the area.  Return to the ER with any new severe symptoms.

## 2023-10-03 NOTE — ED Notes (Signed)
Discharge instructions reviewed.   Opportunity for questions and concerns provided.   Encouraged to follow up as directed on paperwork for Ultrasound study today.   Alert, oriented and ambulatory. No signs of distress.

## 2023-10-03 NOTE — ED Provider Notes (Signed)
[place] EMERGENCY DEPARTMENT AT [facility] Provider Note   CSN: [id number] Arrival date & time: 10/02/23  2037     History  Chief Complaint  Patient presents with   Right Arm Pain     Veronica [name] is a 32 y.o. female who presents on referral from her primary care doctor for evaluation of possible blood clot in her right upper extremity.  Patient has been having "veins poking out" over the anterior elbow for the last week, burning type pain and tender to the touch.  No recent travel or prolonged immobilization surgeries, history of clotting.  She has a progesterone implant for contraceptive in the same arm.  No recent trauma to the arm.  No numbness tingling or weakness in hand.  Anxiety.  HPI     Home Medications Prior to Admission medications   Medication Sig Start Date End Date Taking? Authorizing Provider  albuterol (VENTOLIN HFA) 108 (90 Base) MCG/ACT inhaler INHALE 2 PUFFS BY MOUTH EVERY 4 HOURS AS NEEDED FOR WHEEZING OR SHORTNESS OF BREATH (COUGH). 01/30/23   Raspet, Noberto Retort, [name]  baclofen (LIORESAL) 10 MG tablet Take 1 tablet (10 mg total) by mouth at bedtime as needed for muscle spasms. 05/14/23   Raspet, Noberto Retort, [name]  Blood Pressure KIT Please use to check blood pressure as needed. 07/15/23   Westley Chandler, MD  ciprofloxacin-dexamethasone (CIPRODEX) OTIC suspension Place 4 drops into both ears 2 (two) times daily. 07/11/23   White, Elita Boone, NP  escitalopram (LEXAPRO) 10 MG tablet Take 1 tablet (10 mg total) by mouth daily. 06/15/23   Bess Kinds, MD  etonogestrel (NEXPLANON) 68 MG IMPL implant 1 each by Subdermal route once.    [provider]  famotidine (PEPCID) 20 MG tablet Take 1 tablet by mouth once daily 02/17/23   Cora Collum, DO  Ferrous Sulfate (IRON) 325 (65 Fe) MG TABS TAKE 1 TABLET BY MOUTH EVERY OTHER DAY 09/25/23   Bess Kinds, MD  fluticasone (FLONASE) 50 MCG/ACT nasal spray Place 2 sprays into both nostrils daily. 04/06/23    Cora Collum, DO  fluticasone (FLOVENT HFA) 110 MCG/ACT inhaler Inhale 2 puffs into the lungs 2 (two) times daily. 05/05/23   [name], DO  hydrOXYzine (ATARAX) 25 MG tablet Take 1 tablet (25 mg total) by mouth 3 (three) times daily as needed for anxiety. 07/08/23   Westley Chandler, MD  ipratropium (ATROVENT) 0.03 % nasal spray Place 2 sprays into both nostrils every 12 (twelve) hours. 07/11/23   White, Elita Boone, NP  lidocaine (XYLOCAINE) 2 % solution Use as directed 15 mLs in the mouth or throat every 4 (four) hours as needed for mouth pain. 07/11/23   White, Elita Boone, NP  loratadine (CLARITIN) 10 MG tablet Take 1 tablet (10 mg total) by mouth daily. 05/22/23   Cora Collum, DO  Multiple Vitamins-Minerals (ONE-A-DAY WOMENS PO) Take 1 tablet by mouth daily.    [provider]  pantoprazole (PROTONIX) 20 MG tablet Take 1 tablet (20 mg total) by mouth in the morning. 08/17/23   Meredith Pel, NP  pantoprazole (PROTONIX) 40 MG tablet Take 1 tablet (40 mg total) by mouth daily. 05/20/23   Cora Collum, DO  polyethylene glycol (MIRALAX / GLYCOLAX) 17 g packet Take 17 g by mouth 2 (two) times daily. 05/15/23   Valetta Close, MD  senna (SENOKOT) 8.6 MG TABS tablet Take 1 tablet (8.6 mg total) by mouth 2 (two)  times daily. 05/15/23   Valetta Close, MD      Allergies    Morphine, Ibuprofen, and Morphine and codeine    Review of Systems   Review of Systems  Musculoskeletal:        Right arm pain     Physical Exam Updated Vital Signs BP 119/70 (BP Location: Left Arm)   Pulse 71   Temp 98.3 F (36.8 C) (Oral)   Resp 17   SpO2 100%  Physical Exam Vitals and nursing note reviewed.  Constitutional:      Appearance: She is not ill-appearing or toxic-appearing.  HENT:     Head: Normocephalic and atraumatic.  Eyes:     General: No scleral icterus.       Right eye: No discharge.        Left eye: No discharge.     Conjunctiva/sclera: Conjunctivae normal.   Cardiovascular:     Heart sounds: Normal heart sounds. No murmur heard. Pulmonary:     Effort: Pulmonary effort is normal.  Musculoskeletal:       Arms:  Skin:    General: Skin is warm and dry.  Neurological:     General: No focal deficit present.     Mental Status: She is alert.  Psychiatric:        Mood and Affect: Mood normal.     ED Results / Procedures / Treatments   Labs (all labs ordered are listed, but only abnormal results are displayed) Labs Reviewed - No data to display  EKG None  Radiology No results found.  Procedures Procedures    Medications Ordered in ED Medications - No data to display  ED Course/ Medical Decision Making/ A&P                                 Medical Decision Making 32 -year-old emale who presents with right arm pain.  Normal vitals on intake.  Physical exam as above.  Differential diagnosis includes superficial thrombophlebitis, versus muscular strain versus DVT.   DVT study ordered for later this morning.  Patient will return for image.  Neurovascularly intact in the hand.  Clinical concern for emergent Etiology of her symptoms that would warrant further ED workup or inpatient management is exceedingly low.  [name] voiced understanding of her medical evaluation and treatment plan. Each of their questions answered to their expressed satisfaction.  Return precautions were given.  Patient is well-appearing, stable, and was discharged in good condition.  This chart was dictated using voice recognition software, Dragon. Despite the best efforts of this provider to proofread and correct errors, errors may still occur which can change documentation meaning.         Final Clinical Impression(s) / ED Diagnoses Final diagnoses:  Right arm pain    Rx / DC Orders ED Discharge Orders          Ordered    UE Venous Duplex       Comments: MPORTANT PATIENT INSTRUCTIONS:  You have been scheduled for an Outpatient Vascular Study  at [facility].    If tomorrow is a Saturday, Sunday or holiday, please go to the [facility] Emergency Department Registration Desk at 11 am tomorrow morning and tell them you are there for a vascular study.   If tomorrow is a weekday (Monday-Friday), please go to [facility] Registration at 11 am and  tell them you are there for a vascular study.   10/03/23 0414              Bill Mcvey, Eugene Gavia, [name] 10/03/23 [id number]    Gilda Crease, MD 10/03/23 [phone]

## 2023-10-05 LAB — CERVICOVAGINAL ANCILLARY ONLY
Chlamydia: NEGATIVE
Comment: NORMAL
Comment: NEGATIVE
Comment: NEGATIVE
Neisseria Gonorrhea: NEGATIVE
Trichomonas: NEGATIVE

## 2023-10-06 ENCOUNTER — Ambulatory Visit (HOSPITAL_COMMUNITY): Admission: RE | Admit: 2023-10-06 | Payer: Medicaid Other | Source: Ambulatory Visit

## 2023-10-06 ENCOUNTER — Other Ambulatory Visit: Payer: Self-pay | Admitting: Family Medicine

## 2023-10-06 ENCOUNTER — Telehealth: Payer: Self-pay

## 2023-10-06 DIAGNOSIS — N898 Other specified noninflammatory disorders of vagina: Secondary | ICD-10-CM

## 2023-10-06 MED ORDER — FLUCONAZOLE 150 MG PO TABS
150.0000 mg | ORAL_TABLET | Freq: Once | ORAL | 0 refills | Status: AC
Start: 2023-10-06 — End: 2023-10-06

## 2023-10-06 NOTE — Progress Notes (Signed)
Pt labs negative thus far for cause of vaginal itching and burning sensation, however pt reports that she still has contnued symptoms and request treatment for yeast infxn. She is noting thick white discharge and itching. I think it is appropriate to treat empirically with flucanozole and return to clinic if symptoms still persisting after 1 week.

## 2023-10-06 NOTE — Telephone Encounter (Signed)
Patient has been updated.

## 2023-10-06 NOTE — Telephone Encounter (Signed)
Patient calls nurse line in regards to negative results.   She reports she saw where all of her testing was negative, however she reports she feels she has a yeast infection.   She reports continued burning and itching and reports she started seeing "clumpy white" discharge. Denies odors.  She denies changing soaps, detergents or lotions.   Advised will see if the provider will empirically teat her.

## 2023-10-08 ENCOUNTER — Ambulatory Visit: Payer: Self-pay

## 2023-10-08 ENCOUNTER — Encounter (HOSPITAL_COMMUNITY): Payer: Medicaid Other

## 2023-10-08 ENCOUNTER — Ambulatory Visit: Payer: Medicaid Other

## 2023-10-08 DIAGNOSIS — Z23 Encounter for immunization: Secondary | ICD-10-CM | POA: Diagnosis present

## 2023-10-08 NOTE — Progress Notes (Signed)
Patient presents to nurse clinic for Flu vaccine.  Vaccine administered without complication.  See admin for details.

## 2023-10-24 ENCOUNTER — Encounter (HOSPITAL_COMMUNITY): Payer: Self-pay

## 2023-10-24 ENCOUNTER — Ambulatory Visit (HOSPITAL_COMMUNITY)
Admission: EM | Admit: 2023-10-24 | Discharge: 2023-10-24 | Disposition: A | Discharge status: Home / Self Care | Payer: Medicaid Other | Attending: Physician Assistant | Admitting: Physician Assistant

## 2023-10-24 DIAGNOSIS — H109 Unspecified conjunctivitis: Secondary | ICD-10-CM

## 2023-10-24 MED ORDER — HYPROMELLOSE (GONIOSCOPIC) 2.5 % OP SOLN: 1.0000 [drp] | Freq: Four times a day (QID) | OPHTHALMIC | 0 refills | Status: AC | PRN

## 2023-10-24 MED ORDER — POLYMYXIN B-TRIMETHOPRIM 10000-0.1 UNIT/ML-% OP SOLN: 1.0000 [drp] | Freq: Four times a day (QID) | OPHTHALMIC | 0 refills | Status: DC

## 2023-10-24 NOTE — Discharge Instructions (Signed)
Use Polytrim drop as prescribed.  You can use lubricating eyedrops for symptom relief.  If your symptoms are not improving quickly please follow-up with ophthalmology; call to schedule an appointment.  If anything worsens and you have eye pain, vision change, sensitivity to light, fever, nausea, vomiting you should be seen immediately.

## 2023-10-24 NOTE — ED Provider Notes (Signed)
[facility]    CSN: [id number] Arrival date & time: 10/24/23  1523      History   Chief Complaint Chief Complaint  Patient presents with   Conjunctivitis    HPI Veronica [name] is a 32 y.o. female.   Patient presents today with a week and a half long history of left medial eye irritation and redness.  She does report occasional draining as well as itching but denies any significant pain, visual disturbance, photophobia.  Denies any recent illness including cough, congestion, fever, nausea, vomiting.  She does not wear glasses or contacts.  Denies any exposure to fine particulate matter or chemicals.  She has not tried any over-the-counter medication for symptom management.  She is not currently followed by ophthalmology.  She denies any ocular trauma.  Denies any foreign body sensation.    Past Medical History:  Diagnosis Date   Abdominal pain 01/06/2012   Acute tension headache 07/27/2019   Anemia    Ankle pain    Anxiety    Asthma    Birth control counseling 08/26/2011   BMI 40.0-44.9, adult (HCC) 02/27/2011   Dysuria 01/13/2013   Epigastric pain 09/13/2019   GERD (gastroesophageal reflux disease)    Heart murmur    Hiatal hernia    Obesity (BMI 30-39.9)    Pre-eclampsia    off med since 4/23   Secondary amenorrhea 03/25/2018   Trichomonas infection 06/17/2022    Patient Active Problem List   Diagnosis Date Noted   Constipation 05/17/2023   Nexplanon insertion 05/19/2022   Nocturnal oxygen desaturation 03/23/2022   Gallstones    Allergic rhinitis 03/07/2022   Asthma 08/31/2020   Back pain 02/13/2020   Hiatal hernia 11/02/2019   Subclinical hypothyroidism 05/19/2019   GAD (generalized anxiety disorder) 05/09/2019   Nonintractable episodic headache 11/15/2018   Seasonal allergies 03/25/2018   Chronic cough 10/16/2017   Fatigue 05/09/2016   GERD (gastroesophageal reflux disease) 03/28/2011   BMI 40.0-44.9, adult (HCC) 02/27/2011    Past  Surgical History:  Procedure Laterality Date   CESAREAN SECTION  09/02/2010   For macrosomia, but son was 7 lb 12 oz   CESAREAN SECTION N/A 10/06/2021   Procedure: CESAREAN SECTION;  Surgeon: [place] Bing, MD;  Location: [facility];  Service: Obstetrics;  Laterality: N/A;   CHOLECYSTECTOMY N/A 06/16/2022   Procedure: LAPAROSCOPIC  CHOLECYSTECTOMY;  Surgeon: Berna Bue, MD;  Location: [facility];  Service: General;  Laterality: N/A;   TONSILLECTOMY AND ADENOIDECTOMY      OB History     Gravida  2   Para  2   Term  2   Preterm      AB      Living  2      SAB      IAB      Ectopic      Multiple  0   Live Births  2            Home Medications    Prior to Admission medications   Medication Sig Start Date End Date Taking? Authorizing Provider  albuterol (VENTOLIN HFA) 108 (90 Base) MCG/ACT inhaler INHALE 2 PUFFS BY MOUTH EVERY 4 HOURS AS NEEDED FOR WHEEZING OR SHORTNESS OF BREATH (COUGH). 01/30/23  Yes Nayellie Sanseverino K, [name]  escitalopram (LEXAPRO) 10 MG tablet Take 1 tablet (10 mg total) by mouth daily. 06/15/23  Yes Sowell, Apolinar Junes, MD  etonogestrel (NEXPLANON) 68 MG IMPL implant 1 each by Subdermal route once.  Yes [provider]  famotidine (PEPCID) 20 MG tablet Take 1 tablet by mouth once daily 02/17/23  Yes Paige, Turkey J, DO  fluticasone (FLOVENT HFA) 110 MCG/ACT inhaler Inhale 2 puffs into the lungs 2 (two) times daily. 05/05/23  Yes [name], DO  hydroxypropyl methylcellulose / hypromellose (ISOPTO TEARS / GONIOVISC) 2.5 % ophthalmic solution Place 1 drop into both eyes 4 (four) times daily as needed for dry eyes. 10/24/23  Yes Saxon Barich K, [name]  hydrOXYzine (ATARAX) 25 MG tablet Take 1 tablet (25 mg total) by mouth 3 (three) times daily as needed for anxiety. 07/08/23  Yes Westley Chandler, MD  loratadine (CLARITIN) 10 MG tablet Take 1 tablet (10 mg total) by mouth daily. 05/22/23  Yes Paige, Victoria J, DO  pantoprazole (PROTONIX) 20 MG  tablet Take 1 tablet (20 mg total) by mouth in the morning. 08/17/23  Yes Meredith Pel, NP  pantoprazole (PROTONIX) 40 MG tablet Take 1 tablet (40 mg total) by mouth daily. 05/20/23  Yes Paige, Lucas Mallow, DO  trimethoprim-polymyxin b (POLYTRIM) ophthalmic solution Place 1 drop into the left eye every 6 (six) hours. 10/24/23  Yes Shamus Desantis K, [name]  Blood Pressure KIT Please use to check blood pressure as needed. 07/15/23   Westley Chandler, MD  Ferrous Sulfate (IRON) 325 (65 Fe) MG TABS TAKE 1 TABLET BY MOUTH EVERY OTHER DAY 09/25/23   Bess Kinds, MD  polyethylene glycol (MIRALAX / GLYCOLAX) 17 g packet Take 17 g by mouth 2 (two) times daily. 05/15/23   Valetta Close, MD  senna (SENOKOT) 8.6 MG TABS tablet Take 1 tablet (8.6 mg total) by mouth 2 (two) times daily. 05/15/23   Valetta Close, MD    Family History Family History  Problem Relation Age of Onset   Diabetes Mother    Hypertension Mother    Obesity Mother    Heart attack Mother 27       Death   Obesity Sister    Osteochondroma Sister    Diabetes Sister    Hypertension Sister    Esophageal cancer Neg Hx    Colon cancer Neg Hx    Liver disease Neg Hx     Social History Social History   Tobacco Use   Smoking status: Never   Smokeless tobacco: Never  Vaping Use   Vaping status: Never Used  Substance Use Topics   Alcohol use: Yes    Comment: occ   Drug use: Not Currently    Comment: last used in teens     Allergies   Morphine, Ibuprofen, and Morphine and codeine   Review of Systems Review of Systems  Constitutional:  Negative for activity change, appetite change, fatigue and fever.  HENT:  Negative for congestion.   Eyes:  Positive for redness and itching. Negative for photophobia, pain, discharge and visual disturbance.  Respiratory:  Negative for cough.   Neurological:  Negative for dizziness, light-headedness and headaches.     Physical Exam Triage Vital Signs ED Triage Vitals [10/24/23 1700]   Encounter Vitals Group     BP 110/76     Systolic BP Percentile      Diastolic BP Percentile      Pulse Rate 70     Resp 16     Temp 98.5 F (36.9 C)     Temp Source Oral     SpO2 98 %     Weight      Height  Head Circumference      Peak Flow      Pain Score      Pain Loc      Pain Education      Exclude from Growth Chart    No data found.  Updated Vital Signs BP 110/76 (BP Location: Left Arm)   Pulse 70   Temp 98.5 F (36.9 C) (Oral)   Resp 16   LMP 10/24/2023 (Approximate)   SpO2 98%   Visual Acuity Right Eye Distance: 20/20 Left Eye Distance: 20/20 Bilateral Distance: 20/20  Right Eye Near: R Near: 20/20 Left Eye Near:  L Near: 20/20 Bilateral Near:  20/20  Physical Exam Vitals reviewed.  Constitutional:      General: She is awake. She is not in acute distress.    Appearance: Normal appearance. She is well-developed. She is not ill-appearing.     Comments: Very pleasant female appears stated age in no acute distress sitting comfortably in exam room  HENT:     Head: Normocephalic and atraumatic.  Eyes:     General:        Right eye: No hordeolum.        Left eye: No hordeolum.     Extraocular Movements: Extraocular movements intact.     Conjunctiva/sclera:     Right eye: Right conjunctiva is not injected. No chemosis.    Left eye: Left conjunctiva is injected. No chemosis.    Pupils: Pupils are equal, round, and reactive to light.      Comments: Left eye: Injection noted medial left eye with increased drainage in medial canthus.  Normal extraocular movements.  Cardiovascular:     Rate and Rhythm: Normal rate and regular rhythm.     Heart sounds: No murmur heard. Pulmonary:     Effort: Pulmonary effort is normal.     Breath sounds: Normal breath sounds. No wheezing, rhonchi or rales.  Musculoskeletal:     Right lower leg: No edema.     Left lower leg: No edema.  Lymphadenopathy:     Head:     Right side of head: No preauricular adenopathy.      Left side of head: No preauricular adenopathy.  Psychiatric:        Behavior: Behavior is cooperative.      UC Treatments / Results  Labs (all labs ordered are listed, but only abnormal results are displayed) Labs Reviewed - No data to display  EKG   Radiology No results found.  Procedures Procedures (including critical care time)  Medications Ordered in UC Medications - No data to display  Initial Impression / Assessment and Plan / UC Course  I have reviewed the triage vital signs and the nursing notes.  Pertinent labs & imaging results that were available during my care of the patient were reviewed by me and considered in my medical decision making (see chart for details).     Patient is well-appearing, afebrile, nontoxic, nontachycardic.  Fluorescein staining was deferred as patient denies foreign body sensation or recent trauma.  Concern for bacterial conjunctivitis given unilateral presentation.  Will cover with Polytrim drops.  No indication for fluoroquinolone as patient does not use contacts.  She was also given prescription for artificial tears to help manage discomfort.  Discussed that if her symptoms are not improving within a few days she should follow-up with ophthalmology and was given contact information for local provider with instruction to call to schedule an appointment.  Discussed that if anything worsens and  she has eye pain, visual disturbance, photophobia, fever, nausea, vomiting she should be seen immediately.  Final Clinical Impressions(s) / UC Diagnoses   Final diagnoses:  Bacterial conjunctivitis of left eye     Discharge Instructions      Use Polytrim drop as prescribed.  You can use lubricating eyedrops for symptom relief.  If your symptoms are not improving quickly please follow-up with ophthalmology; call to schedule an appointment.  If anything worsens and you have eye pain, vision change, sensitivity to light, fever, nausea, vomiting  you should be seen immediately.     ED Prescriptions     Medication Sig Dispense Auth. Provider   trimethoprim-polymyxin b (POLYTRIM) ophthalmic solution Place 1 drop into the left eye every 6 (six) hours. 10 mL Skyelyn Scruggs K, [name]   hydroxypropyl methylcellulose / hypromellose (ISOPTO TEARS / GONIOVISC) 2.5 % ophthalmic solution Place 1 drop into both eyes 4 (four) times daily as needed for dry eyes. 15 mL Keiera Strathman K, [name]      PDMP not reviewed this encounter.   Jeani Hawking, [name] 10/24/23 [id number]

## 2023-10-24 NOTE — ED Triage Notes (Signed)
Patient reports left red eye. Pt reports she woke up with eye redness. Denies any itching or drainage.

## 2023-11-12 ENCOUNTER — Other Ambulatory Visit: Payer: Self-pay

## 2023-11-12 DIAGNOSIS — F411 Generalized anxiety disorder: Secondary | ICD-10-CM

## 2023-11-12 DIAGNOSIS — J4521 Mild intermittent asthma with (acute) exacerbation: Secondary | ICD-10-CM

## 2023-11-12 NOTE — Telephone Encounter (Signed)
Patient calls nurse line requesting prescription refills.   Pended to encounter. She is also requesting refill on flonase nasal spray. However, this is not on current med list.   Please advise.   Veronda Prude, RN

## 2023-11-13 MED ORDER — HYDROXYZINE HCL 25 MG PO TABS
25.0000 mg | ORAL_TABLET | Freq: Three times a day (TID) | ORAL | 0 refills | Status: DC | PRN
Start: 2023-11-13 — End: 2024-02-13

## 2023-11-13 MED ORDER — ESCITALOPRAM OXALATE 10 MG PO TABS: 10.0000 mg | ORAL_TABLET | Freq: Every day | ORAL | 0 refills | Status: DC

## 2023-11-13 MED ORDER — FLUTICASONE PROPIONATE HFA 110 MCG/ACT IN AERO
2.0000 | INHALATION_SPRAY | Freq: Two times a day (BID) | RESPIRATORY_TRACT | 1 refills | Status: DC
Start: 2023-11-13 — End: 2024-04-12

## 2023-11-13 MED ORDER — ALBUTEROL SULFATE HFA 108 (90 BASE) MCG/ACT IN AERS: INHALATION_SPRAY | RESPIRATORY_TRACT | 1 refills | Status: DC

## 2023-11-27 ENCOUNTER — Encounter: Payer: Self-pay | Admitting: Student

## 2023-11-27 ENCOUNTER — Ambulatory Visit (INDEPENDENT_AMBULATORY_CARE_PROVIDER_SITE_OTHER): Payer: Medicaid Other | Admitting: Student

## 2023-11-27 VITALS — BP 110/64 | HR 64 | Ht 62.0 in | Wt 251.2 lb

## 2023-11-27 DIAGNOSIS — Z13228 Encounter for screening for other metabolic disorders: Secondary | ICD-10-CM | POA: Diagnosis not present

## 2023-11-27 DIAGNOSIS — E611 Iron deficiency: Secondary | ICD-10-CM

## 2023-11-27 DIAGNOSIS — Z6841 Body Mass Index (BMI) 40.0 and over, adult: Secondary | ICD-10-CM | POA: Diagnosis not present

## 2023-11-27 DIAGNOSIS — E88819 Insulin resistance, unspecified: Secondary | ICD-10-CM

## 2023-11-27 LAB — POCT GLYCOSYLATED HEMOGLOBIN (HGB A1C): Hemoglobin A1C: 5.3 % (ref 4.0–5.6)

## 2023-11-27 MED ORDER — TIRZEPATIDE-WEIGHT MANAGEMENT 2.5 MG/0.5ML ~~LOC~~ SOLN: 2.5000 mg | SUBCUTANEOUS | 0 refills | Status: DC

## 2023-11-27 NOTE — Patient Instructions (Addendum)
It was great to see you! Thank you for allowing me to participate in your care!   Our plans for today:  - Iron, CBC, A1c, LDL - I have sent in zepbound for you   Take care and seek immediate care sooner if you develop any concerns.  Levin Erp, MD

## 2023-11-27 NOTE — Progress Notes (Signed)
    SUBJECTIVE:   CHIEF COMPLAINT / HPI: Follow up and Weight  Expresses concern about their cholesterol levels, as they consume a 3 eggs daily. The patient also mentions a recent increase in their exercise regimen, from two to five times a week. They have a history of low iron levels and are interested in having these levels checked. Has less heavy period due to nexplanon.  The patient is also worried about their weight. They have experienced significant weight fluctuations, from a high of 320 pounds to a low of 238 pounds, and are currently weighing in at 251 pounds. They express frustration with their weight gain despite adhering to a healthy diet and regular exercise. The patient has previously been on tirzepatide for weight management, which they found effective, but was discontinued due to insurance no longer approving it. They were then switched to Victoza, which they did not tolerate well due to severe headaches and nausea.  Denies any personal history of pancreatitis, MEN-1/2.  Does have a history of reflux and hiatal hernia however she tolerated Mounjaro well before.  PERTINENT  PMH / PSH: GERD, asthma  OBJECTIVE:   BP 110/64   Pulse 64   Ht 5\' 2"  (1.575 m)   Wt 251 lb 3.2 oz (113.9 kg)   SpO2 100%   BMI 45.95 kg/m   General: Well appearing, NAD, awake, alert, responsive to questions Head: Normocephalic atraumatic Respiratory: chest rises symmetrically,  no increased work of breathing Extremities: Moves upper and lower extremities freely ASSESSMENT/PLAN:   Assessment & Plan BMI 45.0-49.9, adult (HCC) Patient would benefit from GLP-1 therapy and has in the past.  No medical contraindications for medication.  We did discuss possibility of reflux worsening with this medicine however patient states that she has tolerated this in the past.  A1c today was 5.3. -Zepbound 2.5 mg weekly Encounter for screening for other metabolic disorders - LDL Cholesterol, Direct Iron  deficiency Last ferritin 14 2023. - Continue ferrous sulfate every other day - Iron, TIBC and Ferritin Panel  - CBC   Levin Erp, MD [facility]

## 2023-11-28 LAB — IRON,TIBC AND FERRITIN PANEL
Ferritin: 23 ng/mL (ref 15–150)
Iron Saturation: 12 % — ABNORMAL LOW (ref 15–55)
Iron: 30 ug/dL (ref 27–159)
Total Iron Binding Capacity: 252 ug/dL (ref 250–450)
UIBC: 222 ug/dL (ref 131–425)

## 2023-11-28 LAB — CBC
Hematocrit: 35.5 % (ref 34.0–46.6)
Hemoglobin: 11.3 g/dL (ref 11.1–15.9)
MCH: 27.6 pg (ref 26.6–33.0)
MCHC: 31.8 g/dL (ref 31.5–35.7)
MCV: 87 fL (ref 79–97)
Platelets: 321 10*3/uL (ref 150–450)
RBC: 4.1 x10E6/uL (ref 3.77–5.28)
RDW: 12.9 % (ref 11.7–15.4)
WBC: 5.8 10*3/uL (ref 3.4–10.8)

## 2023-11-28 LAB — LDL CHOLESTEROL, DIRECT: LDL Direct: 66 mg/dL (ref 0–99)

## 2023-11-29 ENCOUNTER — Encounter: Payer: Self-pay | Admitting: Student

## 2023-11-30 ENCOUNTER — Other Ambulatory Visit: Payer: Self-pay

## 2023-12-17 ENCOUNTER — Ambulatory Visit: Payer: Medicaid Other | Admitting: Internal Medicine

## 2023-12-22 ENCOUNTER — Telehealth: Payer: Self-pay

## 2023-12-22 NOTE — Telephone Encounter (Signed)
 Patient calls nurse line requesting a round of Diflucan .   She reports symptoms started ~3 days ago. She reports thick white discharge with a yeast smell. She reports mild irritation.   She reports last sexual encounter was months ago.  Advised she may need to make an apt, however she reports with work this month she will be unable to.   Advised will forward to PCP.

## 2023-12-23 NOTE — Telephone Encounter (Signed)
 Can we get patient scheduled for a video visit? I want her talked to and evaluated, before we treat anything.   -BS

## 2023-12-24 NOTE — Telephone Encounter (Signed)
Attempted to call patient to schedule an office visit.   However, no answer. I left a VM asking her to call back to schedule an apt.

## 2023-12-27 ENCOUNTER — Ambulatory Visit (HOSPITAL_COMMUNITY)
Admission: EM | Admit: 2023-12-27 | Discharge: 2023-12-27 | Disposition: A | Discharge status: Home / Self Care | Payer: Medicaid Other | Attending: Emergency Medicine | Admitting: Emergency Medicine

## 2023-12-27 ENCOUNTER — Encounter (HOSPITAL_COMMUNITY): Payer: Self-pay

## 2023-12-27 DIAGNOSIS — J029 Acute pharyngitis, unspecified: Secondary | ICD-10-CM

## 2023-12-27 LAB — POC COVID19/FLU A&B COMBO
Covid Antigen, POC: NEGATIVE
Influenza A Antigen, POC: NEGATIVE
Influenza B Antigen, POC: NEGATIVE

## 2023-12-27 LAB — POCT RAPID STREP A (OFFICE): Rapid Strep A Screen: NEGATIVE

## 2023-12-27 MED ORDER — LIDOCAINE VISCOUS HCL 2 % MT SOLN: 15.0000 mL | OROMUCOSAL | 0 refills | Status: DC | PRN

## 2023-12-27 NOTE — ED Provider Notes (Signed)
[facility]    CSN: [id number] Arrival date & time: 12/27/23  1146      History   Chief Complaint No chief complaint on file.   HPI Veronica [name] is a 33 y.o. female.   Patient presents with sore throat with sensation of swelling and right ear pain since yesterday.  Denies fever, congestion, cough, and difficulty breathing.     Past Medical History:  Diagnosis Date   Abdominal pain 01/06/2012   Acute tension headache 07/27/2019   Anemia    Ankle pain    Anxiety    Asthma    Birth control counseling 08/26/2011   BMI 40.0-44.9, adult (HCC) 02/27/2011   Dysuria 01/13/2013   Epigastric pain 09/13/2019   GERD (gastroesophageal reflux disease)    Heart murmur    Hiatal hernia    Obesity (BMI 30-39.9)    Pre-eclampsia    off med since 4/23   Secondary amenorrhea 03/25/2018   Trichomonas infection 06/17/2022    Patient Active Problem List   Diagnosis Date Noted   Constipation 05/17/2023   Nexplanon insertion 05/19/2022   Nocturnal oxygen desaturation 03/23/2022   Allergic rhinitis 03/07/2022   Asthma 08/31/2020   Back pain 02/13/2020   Hiatal hernia 11/02/2019   Subclinical hypothyroidism 05/19/2019   GAD (generalized anxiety disorder) 05/09/2019   Nonintractable episodic headache 11/15/2018   Seasonal allergies 03/25/2018   Chronic cough 10/16/2017   Fatigue 05/09/2016   GERD (gastroesophageal reflux disease) 03/28/2011   BMI 40.0-44.9, adult (HCC) 02/27/2011    Past Surgical History:  Procedure Laterality Date   CESAREAN SECTION  09/02/2010   For macrosomia, but son was 7 lb 12 oz   CESAREAN SECTION N/A 10/06/2021   Procedure: CESAREAN SECTION;  Surgeon: [place] Bing, MD;  Location: [facility];  Service: Obstetrics;  Laterality: N/A;   CHOLECYSTECTOMY N/A 06/16/2022   Procedure: LAPAROSCOPIC  CHOLECYSTECTOMY;  Surgeon: Berna Bue, MD;  Location: [facility];  Service: General;  Laterality: N/A;   TONSILLECTOMY AND ADENOIDECTOMY       OB History     Gravida  2   Para  2   Term  2   Preterm      AB      Living  2      SAB      IAB      Ectopic      Multiple  0   Live Births  2            Home Medications    Prior to Admission medications   Medication Sig Start Date End Date Taking? Authorizing Provider  lidocaine (XYLOCAINE) 2 % solution Use as directed 15 mLs in the mouth or throat as needed for mouth pain. 12/27/23  Yes Susann Givens, Sundeep Destin A, NP  albuterol (VENTOLIN HFA) 108 (90 Base) MCG/ACT inhaler INHALE 2 PUFFS BY MOUTH EVERY 4 HOURS AS NEEDED FOR WHEEZING OR SHORTNESS OF BREATH (COUGH). 11/13/23   Carney Living, MD  Blood Pressure KIT Please use to check blood pressure as needed. 07/15/23   Westley Chandler, MD  escitalopram (LEXAPRO) 10 MG tablet Take 1 tablet (10 mg total) by mouth daily. 11/13/23   Carney Living, MD  etonogestrel (NEXPLANON) 68 MG IMPL implant 1 each by Subdermal route once.    [provider]  famotidine (PEPCID) 20 MG tablet Take 1 tablet by mouth once daily 02/17/23   Cora Collum, DO  Ferrous Sulfate (IRON) 325 (65 Fe)  MG TABS TAKE 1 TABLET BY MOUTH EVERY OTHER DAY 09/25/23   Bess Kinds, MD  fluticasone (FLOVENT HFA) 110 MCG/ACT inhaler Inhale 2 puffs into the lungs 2 (two) times daily. 11/13/23   Carney Living, MD  hydroxypropyl methylcellulose / hypromellose (ISOPTO TEARS / GONIOVISC) 2.5 % ophthalmic solution Place 1 drop into both eyes 4 (four) times daily as needed for dry eyes. 10/24/23   Raspet, Noberto Retort, [name]  hydrOXYzine (ATARAX) 25 MG tablet Take 1 tablet (25 mg total) by mouth 3 (three) times daily as needed for anxiety. 11/13/23   Carney Living, MD  loratadine (CLARITIN) 10 MG tablet Take 1 tablet (10 mg total) by mouth daily. 05/22/23   Cora Collum, DO  pantoprazole (PROTONIX) 20 MG tablet Take 1 tablet (20 mg total) by mouth in the morning. 08/17/23   Meredith Pel, NP  pantoprazole (PROTONIX) 40 MG tablet  Take 1 tablet (40 mg total) by mouth daily. 05/20/23   Cora Collum, DO  polyethylene glycol (MIRALAX / GLYCOLAX) 17 g packet Take 17 g by mouth 2 (two) times daily. 05/15/23   Valetta Close, MD  senna (SENOKOT) 8.6 MG TABS tablet Take 1 tablet (8.6 mg total) by mouth 2 (two) times daily. 05/15/23   Valetta Close, MD  tirzepatide [facility]) 2.5 MG/0.5ML injection vial Inject 2.5 mg into the skin once a week. 11/27/23   Levin Erp, MD  trimethoprim-polymyxin b (POLYTRIM) ophthalmic solution Place 1 drop into the left eye every 6 (six) hours. 10/24/23   Raspet, Noberto Retort, [name]    Family History Family History  Problem Relation Age of Onset   Diabetes Mother    Hypertension Mother    Obesity Mother    Heart attack Mother 9       Death   Obesity Sister    Osteochondroma Sister    Diabetes Sister    Hypertension Sister    Esophageal cancer Neg Hx    Colon cancer Neg Hx    Liver disease Neg Hx     Social History Social History   Tobacco Use   Smoking status: Never   Smokeless tobacco: Never  Vaping Use   Vaping status: Never Used  Substance Use Topics   Alcohol use: Yes    Comment: occ   Drug use: Not Currently    Comment: last used in teens     Allergies   Morphine, Ibuprofen, and Morphine and codeine   Review of Systems Review of Systems  Constitutional:  Negative for chills, fatigue and fever.  HENT:  Positive for ear pain and sore throat. Negative for congestion.   Respiratory:  Negative for cough and shortness of breath.      Physical Exam Triage Vital Signs ED Triage Vitals  Encounter Vitals Group     BP 12/27/23 1229 107/72     Systolic BP Percentile --      Diastolic BP Percentile --      Pulse Rate 12/27/23 1217 98     Resp 12/27/23 1217 18     Temp 12/27/23 1217 99.6 F (37.6 C)     Temp Source 12/27/23 1217 Oral     SpO2 12/27/23 1217 99 %     Weight --      Height --      Head Circumference --      Peak Flow --      Pain Score  12/27/23 1215 6     Pain Loc --  Pain Education --      Exclude from Growth Chart --    No data found.  Updated Vital Signs BP 107/72 (BP Location: Left Arm)   Pulse 70   Temp 99.6 F (37.6 C) (Oral)   Resp 18   LMP 12/26/2023   SpO2 99%   Visual Acuity Right Eye Distance:   Left Eye Distance:   Bilateral Distance:    Right Eye Near:   Left Eye Near:    Bilateral Near:     Physical Exam Vitals and nursing note reviewed.  Constitutional:      General: She is awake. She is not in acute distress.    Appearance: Normal appearance. She is well-developed and well-groomed. She is not ill-appearing.  HENT:     Right Ear: Tympanic membrane, ear canal and external ear normal.     Left Ear: Tympanic membrane, ear canal and external ear normal.     Nose: Rhinorrhea present. No congestion.     Mouth/Throat:     Mouth: Mucous membranes are moist.     Pharynx: Posterior oropharyngeal erythema and postnasal drip present. No oropharyngeal exudate.     Tonsils: No tonsillar exudate.  Cardiovascular:     Rate and Rhythm: Normal rate and regular rhythm.  Pulmonary:     Effort: Pulmonary effort is normal.     Breath sounds: Normal breath sounds.  Skin:    General: Skin is warm and dry.  Neurological:     Mental Status: She is alert.  Psychiatric:        Behavior: Behavior is cooperative.      UC Treatments / Results  Labs (all labs ordered are listed, but only abnormal results are displayed) Labs Reviewed  POCT RAPID STREP A (OFFICE)  POC COVID19/FLU A&B COMBO    EKG   Radiology No results found.  Procedures Procedures (including critical care time)  Medications Ordered in UC Medications - No data to display  Initial Impression / Assessment and Plan / UC Course  I have reviewed the triage vital signs and the nursing notes.  Pertinent labs & imaging results that were available during my care of the patient were reviewed by me and considered in my medical  decision making (see chart for details).     Patient presented with sore throat with sensation of swelling in the right ear pain since yesterday.  Upon assessment mild erythema and postnasal drip noted to pharynx and rhinorrhea present.  No other significant findings upon exam.  Rapid strep and COVID/flu negative.  Prescribed lidocaine as needed for sore throat.  Recommended Tylenol ibuprofen as needed.  Discussed return and strict ER precautions. Final Clinical Impressions(s) / UC Diagnoses   Final diagnoses:  Viral pharyngitis     Discharge Instructions      You can use lidocaine solution as needed for sore throat.  Gargle and spit this, do not swallow.  Otherwise alternate between Tylenol and ibuprofen as needed for pain.  Return here if symptoms persist.  If you develop severe swelling, trouble breathing, or difficulty swallowing please seek immediate medical treatment in the ER.    ED Prescriptions     Medication Sig Dispense Auth. Provider   lidocaine (XYLOCAINE) 2 % solution Use as directed 15 mLs in the mouth or throat as needed for mouth pain. 100 mL Wynonia Lawman A, NP      PDMP not reviewed this encounter.   Wynonia Lawman A, NP 12/27/23 [phone]

## 2023-12-27 NOTE — Discharge Instructions (Signed)
You can use lidocaine solution as needed for sore throat.  Gargle and spit this, do not swallow.  Otherwise alternate between Tylenol and ibuprofen as needed for pain.  Return here if symptoms persist.  If you develop severe swelling, trouble breathing, or difficulty swallowing please seek immediate medical treatment in the ER.

## 2023-12-27 NOTE — ED Triage Notes (Signed)
Pt reports she feels as though her throat is closing and has right ear pain x 1 day

## 2024-01-07 DIAGNOSIS — F411 Generalized anxiety disorder: Secondary | ICD-10-CM | POA: Diagnosis not present

## 2024-01-07 NOTE — Patient Instructions (Signed)
It was great to see you! Thank you for allowing me to participate in your care!  I recommend that you always bring your medications to each appointment as this makes it easy to ensure we are on the correct medications and helps [place] not miss when refills are needed.  Our plans for today:  - Dizziness Today we are working you up for dizziness. We'll see if your blood shows any issues with electrolytes/kidneys/anemia  BMP, CBC  We are checking some labs today, I will call you if they are abnormal will send you a MyChart message or a letter if they are normal.  If you do not hear about your labs in the next 2 weeks please let us know.  Take care and seek immediate care sooner if you develop any concerns.   Dr. Bess Kinds, MD [facility]

## 2024-01-07 NOTE — Progress Notes (Signed)
  SUBJECTIVE:   CHIEF COMPLAINT / HPI:   Dizziness and Anxiety meds Has been on lexapro for 2.5 years. Started feeling light headed and faint for last week. Dizziness happens randomly, and can be during the am or pm. Can happen 3 + hours after a workout and she usually goes to sleep to make it better. Feels like she might faint when it happens.  Drinks good fluids before gym, but less so after. No emesis or diarrhea, but does note she can have heavy bleeding with her periods. Pt reports I can leave VM with results.  PERTINENT  PMH / PSH:    OBJECTIVE:  BP (!) 88/57   Pulse 78   Ht 5\' 2"  (1.575 m)   Wt 246 lb (111.6 kg)   LMP 12/26/2023   SpO2 100%   BMI 44.99 kg/m  Physical Exam Constitutional:      General: She is not in acute distress.    Appearance: Normal appearance. She is not ill-appearing.  Cardiovascular:     Rate and Rhythm: Normal rate and regular rhythm.     Pulses: Normal pulses.     Heart sounds: Normal heart sounds. No murmur heard.    No friction rub. No gallop.  Pulmonary:     Effort: Pulmonary effort is normal. No respiratory distress.     Breath sounds: Normal breath sounds. No stridor. No wheezing, rhonchi or rales.  Neurological:     Mental Status: She is alert.  Psychiatric:        Mood and Affect: Affect normal. Mood is anxious.      ASSESSMENT/PLAN:   Assessment & Plan Dizziness Patient comes in with complaint of dizziness, and has been an issue for the last week.  Patient appreciates she randomly has periods where she feels like she might pass out, has to lay down, for symptoms to pass.  Patient appreciates that she has been working out more, and may not be drinking fluids afterwards, also appreciates that she has heavy periods.  Patient negative for orthostatic vitals, though blood pressure was soft when she first came into clinic.  Will check blood work for anemia/electrolyte derangement.EKG was normal. - CBC, BMP No follow-ups on file. Bess Kinds, MD 01/08/2024, 3:11 PM PGY-3, [facility]

## 2024-01-08 ENCOUNTER — Ambulatory Visit (INDEPENDENT_AMBULATORY_CARE_PROVIDER_SITE_OTHER): Payer: Medicaid Other | Admitting: Student

## 2024-01-08 ENCOUNTER — Other Ambulatory Visit: Payer: Self-pay

## 2024-01-08 ENCOUNTER — Encounter: Payer: Self-pay | Admitting: Student

## 2024-01-08 VITALS — BP 117/78 | HR 72 | Ht 62.0 in | Wt 246.0 lb

## 2024-01-08 DIAGNOSIS — R42 Dizziness and giddiness: Secondary | ICD-10-CM | POA: Insufficient documentation

## 2024-01-08 NOTE — Assessment & Plan Note (Addendum)
Patient comes in with complaint of dizziness, and has been an issue for the last week.  Patient appreciates she randomly has periods where she feels like she might pass out, has to lay down, for symptoms to pass.  Patient appreciates that she has been working out more, and may not be drinking fluids afterwards, also appreciates that she has heavy periods.  Patient negative for orthostatic vitals, though blood pressure was soft when she first came into clinic.  Will check blood work for anemia/electrolyte derangement.EKG was normal. - CBC, BMP

## 2024-01-09 LAB — CBC
Hematocrit: 39.6 % (ref 34.0–46.6)
Hemoglobin: 13.1 g/dL (ref 11.1–15.9)
MCH: 28.4 pg (ref 26.6–33.0)
MCHC: 33.1 g/dL (ref 31.5–35.7)
MCV: 86 fL (ref 79–97)
Platelets: 324 10*3/uL (ref 150–450)
RBC: 4.61 x10E6/uL (ref 3.77–5.28)
RDW: 13 % (ref 11.7–15.4)
WBC: 6.3 10*3/uL (ref 3.4–10.8)

## 2024-01-09 LAB — BASIC METABOLIC PANEL
BUN/Creatinine Ratio: 10 (ref 9–23)
BUN: 11 mg/dL (ref 6–20)
CO2: 23 mmol/L (ref 20–29)
Calcium: 9.4 mg/dL (ref 8.7–10.2)
Chloride: 101 mmol/L (ref 96–106)
Creatinine, Ser: 1.1 mg/dL — ABNORMAL HIGH (ref 0.57–1.00)
Glucose: 102 mg/dL — ABNORMAL HIGH (ref 70–99)
Potassium: 4.2 mmol/L (ref 3.5–5.2)
Sodium: 139 mmol/L (ref 134–144)
eGFR: 69 mL/min/{1.73_m2} (ref >59)

## 2024-01-12 IMAGING — US US RENAL
1 series · 14 of 25 positions shown · non-contrast
Comparison: None.

CLINICAL DATA: Right flank pain for a week.

EXAM:
RENAL / URINARY TRACT ULTRASOUND COMPLETE

[Series 1: us renal · 14 of 37 slices shown]
[im 1/37]
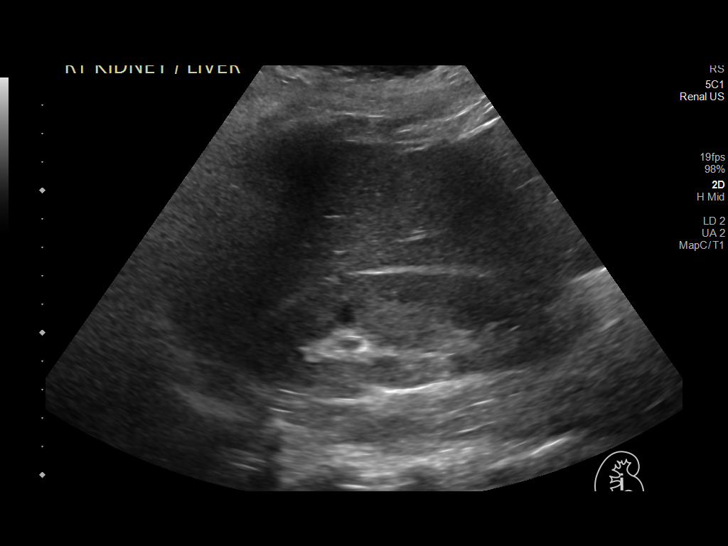
[im 4/37]
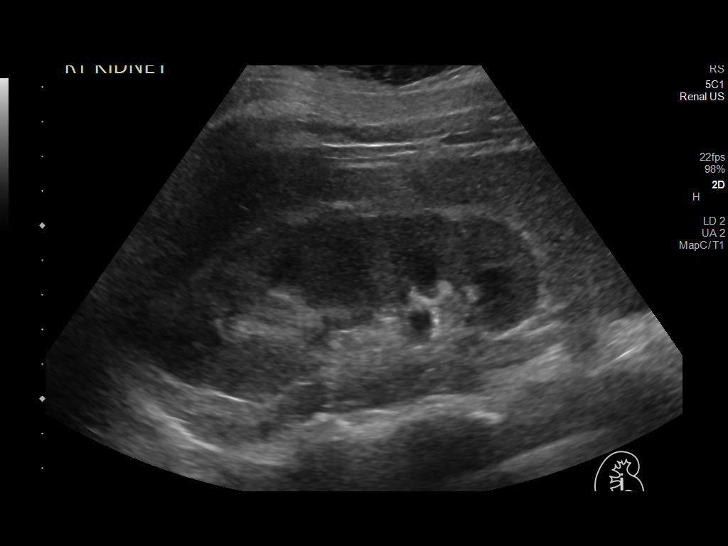
[im 7/37]
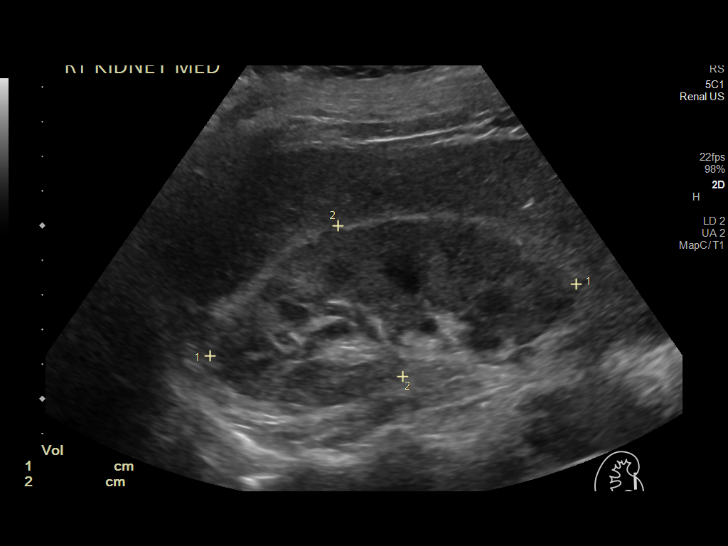
[im 10/37]
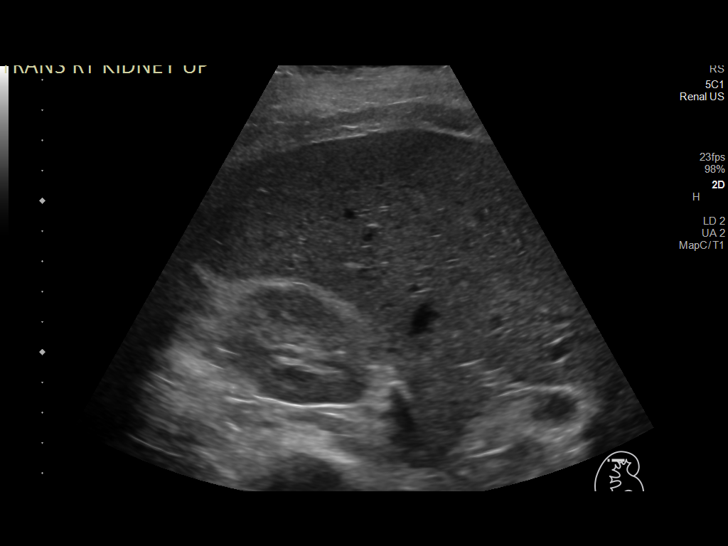
[im 13/37]
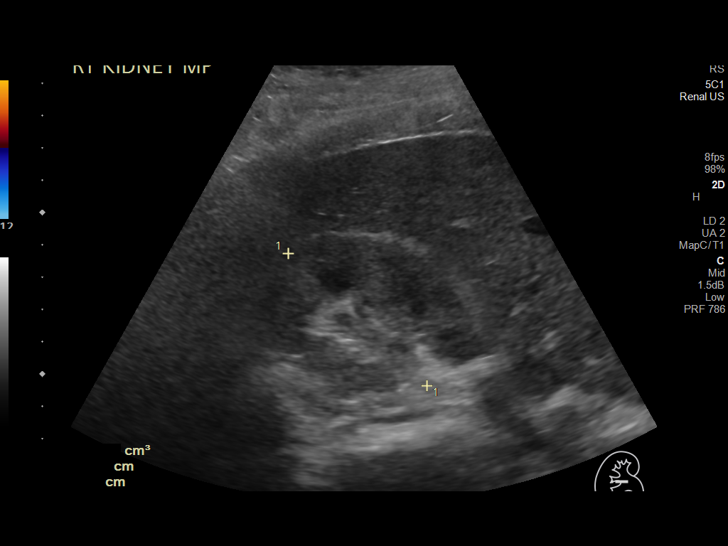
[im 14/37]
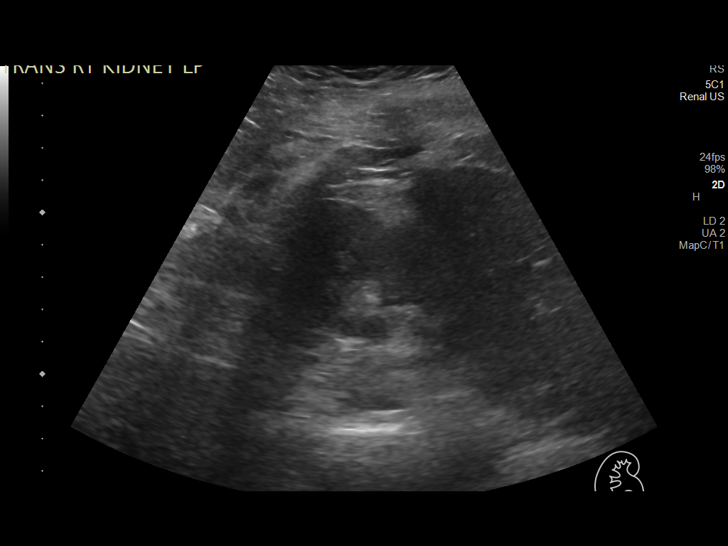
[im 17/37]
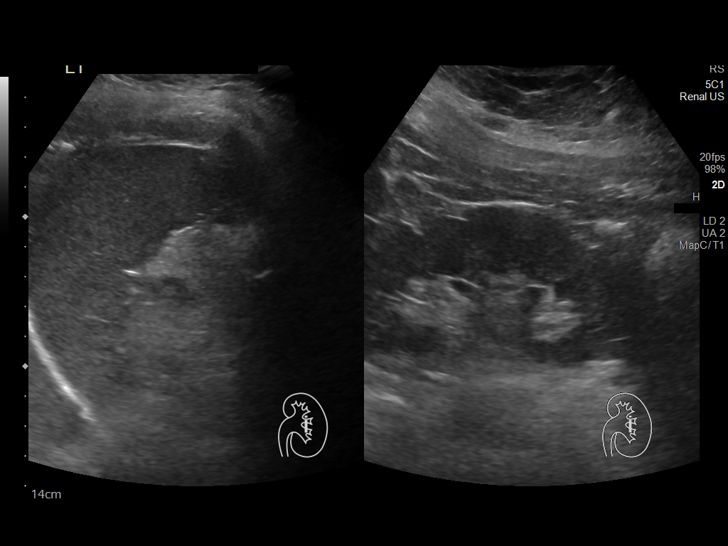
[im 20/37]
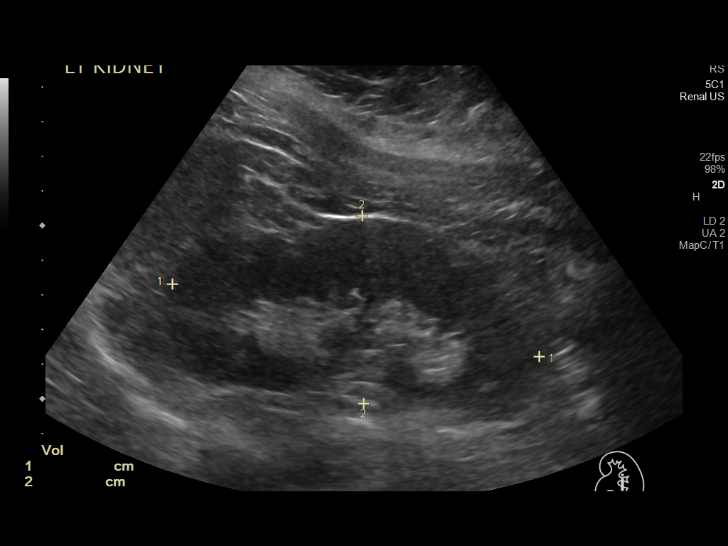
[im 23/37]
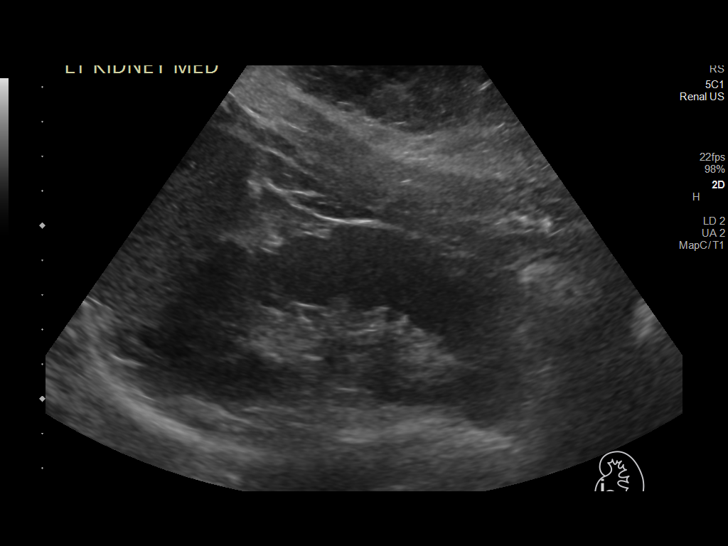
[im 25/37]
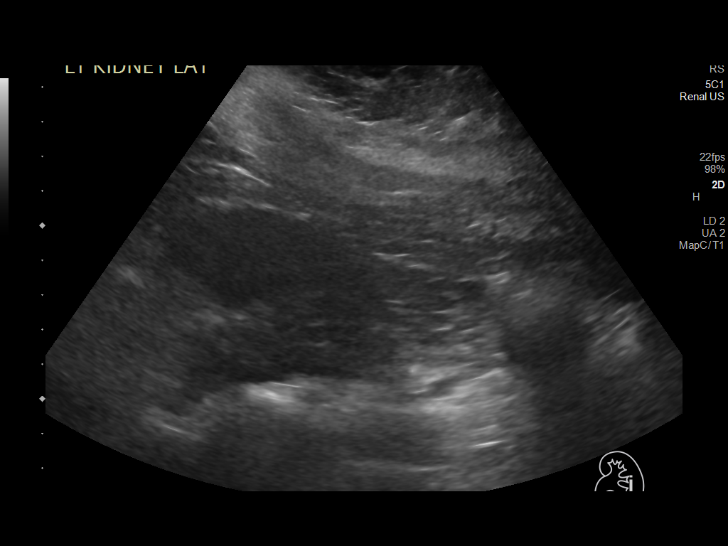
[im 28/37]
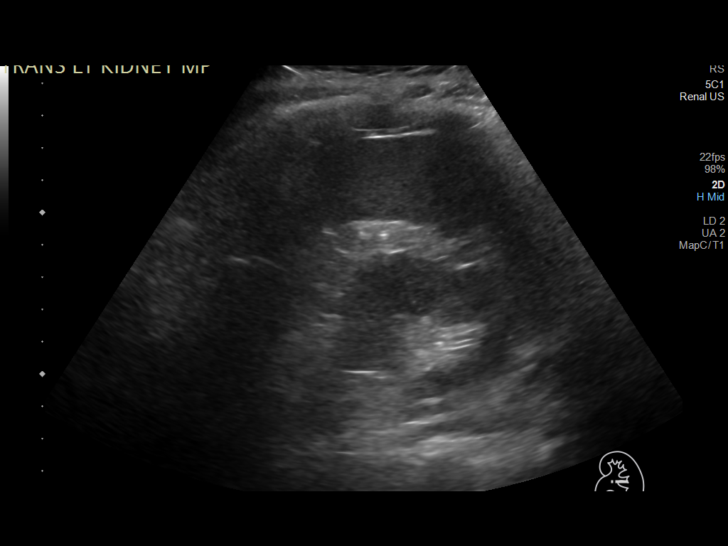
[im 31/37]
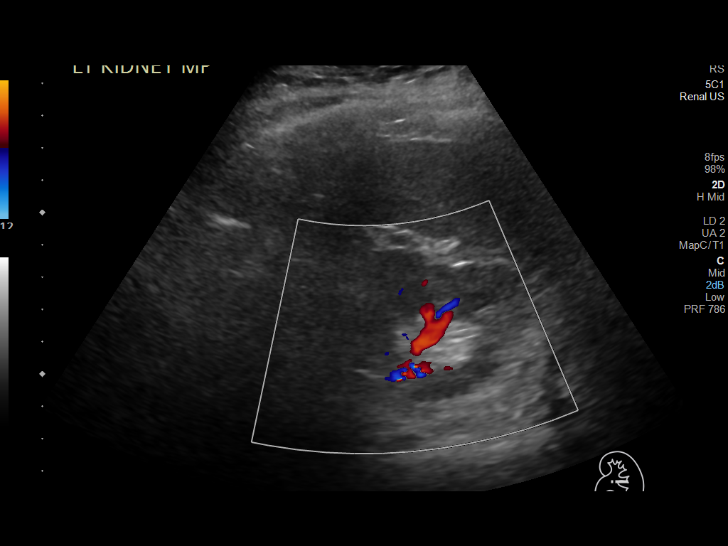
[im 34/37]
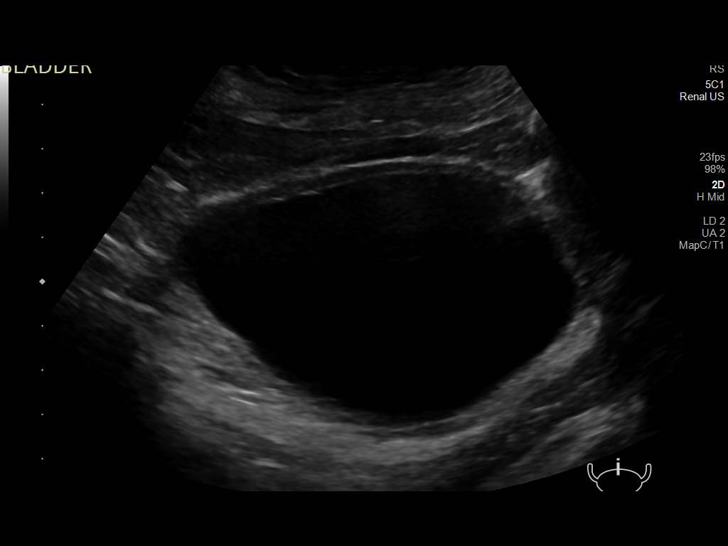
[im 37/37]
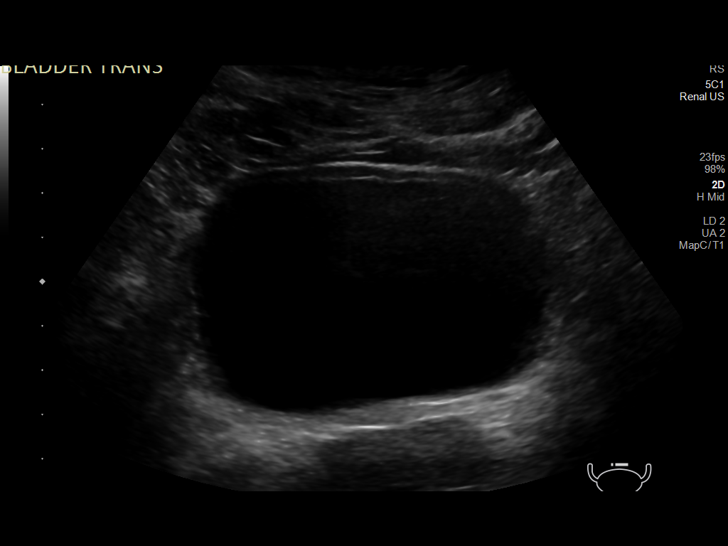

[14 of 25 positions shown; findings below may reference images not displayed]

FINDINGS: Right Kidney:

Renal measurements: 10.8 x 4.7 x 5.9 cm = volume: 158.2 mL.
Echogenicity within normal limits. No mass or hydronephrosis
visualized.

Left Kidney:

Renal measurements: 10.8 x 5.4 x 4.0 cm = volume: 123 mL.
Echogenicity within normal limits. No mass or hydronephrosis
visualized.

Bladder:

Appears normal for degree of bladder distention.

Other:

None.
IMPRESSION: Normal study. No evidence of stone or obstruction. No cause for
symptoms identified.

## 2024-01-16 ENCOUNTER — Other Ambulatory Visit: Payer: Self-pay | Admitting: Student

## 2024-01-16 DIAGNOSIS — D649 Anemia, unspecified: Secondary | ICD-10-CM

## 2024-01-18 ENCOUNTER — Other Ambulatory Visit: Payer: Self-pay | Admitting: Student

## 2024-01-18 DIAGNOSIS — Z6841 Body Mass Index (BMI) 40.0 and over, adult: Secondary | ICD-10-CM

## 2024-01-20 NOTE — Progress Notes (Unsigned)
Chief Complaint: follow-up on GERD Primary GI Doctor: Dr. Rhea Belton  HPI:  Patient is a  33  year old female patient with past medical history of obesity, asthma, anxiety, GERD, IDA who presents  for follow up on GERD.    On 08/17/23 patient seen in GI office by Gunnar Fusi, NP for follow-up on GERD.  Recent breakthrough symptoms improved after increasing pantoprazole from 20 mg daily to 40 mg daily.  They discussed trying to decrease her dosage back down to 20 mg to see if she can tolerate.  Constipation is improved.  Taking MiraLAX as needed.  Interval History  Patient admits/denies GERD Patient admits/denies dysphagia Patient admits/denies nausea, vomiting, or weight loss  Patient admits/denies altered bowel habits Patient admits/denies abdominal pain Patient admits/denies rectal bleeding   Denies/Admits alcohol Denies/Admits smoking Denies/Admits NSAID use. Denies/Admits they are on blood thinners.  Patients last colonoscopy Patients last EGD  Patient's family history includes  Wt Readings from Last 3 Encounters:  01/08/24 246 lb (111.6 kg)  11/27/23 251 lb 3.2 oz (113.9 kg)  10/01/23 243 lb 9.6 oz (110.5 kg)      Past Medical History:  Diagnosis Date   Abdominal pain 01/06/2012   Acute tension headache 07/27/2019   Anemia    Ankle pain    Anxiety    Asthma    Birth control counseling 08/26/2011   BMI 40.0-44.9, adult (HCC) 02/27/2011   Dysuria 01/13/2013   Epigastric pain 09/13/2019   GERD (gastroesophageal reflux disease)    Heart murmur    Hiatal hernia    Obesity (BMI 30-39.9)    Pre-eclampsia    off med since 4/23   Secondary amenorrhea 03/25/2018   Trichomonas infection 06/17/2022    Past Surgical History:  Procedure Laterality Date   CESAREAN SECTION  09/02/2010   For macrosomia, but son was 7 lb 12 oz   CESAREAN SECTION N/A 10/06/2021   Procedure: CESAREAN SECTION;  Surgeon: Perry Hall Bing, MD;  Location: [facility] LD ORS;  Service: Obstetrics;   Laterality: N/A;   CHOLECYSTECTOMY N/A 06/16/2022   Procedure: LAPAROSCOPIC  CHOLECYSTECTOMY;  Surgeon: Berna Bue, MD;  Location: [facility] ORS;  Service: General;  Laterality: N/A;   TONSILLECTOMY AND ADENOIDECTOMY      Current Outpatient Medications  Medication Sig Dispense Refill   albuterol (VENTOLIN HFA) 108 (90 Base) MCG/ACT inhaler INHALE 2 PUFFS BY MOUTH EVERY 4 HOURS AS NEEDED FOR WHEEZING OR SHORTNESS OF BREATH (COUGH). 18 g 1   Blood Pressure KIT Please use to check blood pressure as needed. 1 kit 0   escitalopram (LEXAPRO) 10 MG tablet Take 1 tablet (10 mg total) by mouth daily. 90 tablet 0   etonogestrel (NEXPLANON) 68 MG IMPL implant 1 each by Subdermal route once.     famotidine (PEPCID) 20 MG tablet Take 1 tablet by mouth once daily 30 tablet 0   ferrous sulfate (FEROSUL) 325 (65 FE) MG tablet TAKE 1 TABLET BY MOUTH EVERY OTHER DAY 30 tablet 2   fluticasone (FLOVENT HFA) 110 MCG/ACT inhaler Inhale 2 puffs into the lungs 2 (two) times daily. 1 each 1   hydroxypropyl methylcellulose / hypromellose (ISOPTO TEARS / GONIOVISC) 2.5 % ophthalmic solution Place 1 drop into both eyes 4 (four) times daily as needed for dry eyes. 15 mL 0   hydrOXYzine (ATARAX) 25 MG tablet Take 1 tablet (25 mg total) by mouth 3 (three) times daily as needed for anxiety. 90 tablet 0   lidocaine (XYLOCAINE) 2 % solution Use  as directed 15 mLs in the mouth or throat as needed for mouth pain. 100 mL 0   loratadine (CLARITIN) 10 MG tablet Take 1 tablet (10 mg total) by mouth daily. 30 tablet 11   pantoprazole (PROTONIX) 20 MG tablet Take 1 tablet (20 mg total) by mouth in the morning. 30 tablet 5   pantoprazole (PROTONIX) 40 MG tablet Take 1 tablet (40 mg total) by mouth daily. 30 tablet 3   polyethylene glycol (MIRALAX / GLYCOLAX) 17 g packet Take 17 g by mouth 2 (two) times daily. 14 each 1   senna (SENOKOT) 8.6 MG TABS tablet Take 1 tablet (8.6 mg total) by mouth 2 (two) times daily. 120 tablet 0    tirzepatide (ZEPBOUND) 2.5 MG/0.5ML injection vial Inject 2.5 mg into the skin once a week. 2 mL 0   trimethoprim-polymyxin b (POLYTRIM) ophthalmic solution Place 1 drop into the left eye every 6 (six) hours. 10 mL 0   No current facility-administered medications for this visit.    Allergies as of 01/21/2024 - Review Complete 01/08/2024  Allergen Reaction Noted   Morphine Anaphylaxis 10/04/2022   Ibuprofen Nausea And Vomiting 10/19/2017   Morphine and codeine Other (See Comments) 02/06/2013    Family History  Problem Relation Age of Onset   Diabetes Mother    Hypertension Mother    Obesity Mother    Heart attack Mother 59       Death   Obesity Sister    Osteochondroma Sister    Diabetes Sister    Hypertension Sister    Esophageal cancer Neg Hx    Colon cancer Neg Hx    Liver disease Neg Hx     Review of Systems:    Constitutional: No weight loss, fever, chills, weakness or fatigue HEENT: Eyes: No change in vision               Ears, Nose, Throat:  No change in hearing or congestion Skin: No rash or itching Cardiovascular: No chest pain, chest pressure or palpitations   Respiratory: No SOB or cough Gastrointestinal: See HPI and otherwise negative Genitourinary: No dysuria or change in urinary frequency Neurological: No headache, dizziness or syncope Musculoskeletal: No new muscle or joint pain Hematologic: No bleeding or bruising Psychiatric: No history of depression or anxiety    Physical Exam:  Vital signs: LMP 12/26/2023   Constitutional:   Pleasant Caucasian female*** appears to be in NAD, Well developed, Well nourished, alert and cooperative Head:  Normocephalic and atraumatic. Eyes:   PEERL, EOMI. No icterus. Conjunctiva pink. Ears:  Normal auditory acuity. Neck:  Supple Throat: Oral cavity and pharynx without inflammation, swelling or lesion.  Respiratory: Respirations even and unlabored. Lungs clear to auscultation bilaterally.   No wheezes, crackles, or  rhonchi.  Cardiovascular: Normal S1, S2. Regular rate and rhythm. No peripheral edema, cyanosis or pallor.  Gastrointestinal:  Soft, nondistended, nontender. No rebound or guarding. Normal bowel sounds. No appreciable masses or hepatomegaly. Rectal:  Not performed.  Anoscopy: Msk:  Symmetrical without gross deformities. Without edema, no deformity or joint abnormality.  Neurologic:  Alert and  oriented x4;  grossly normal neurologically.  Skin:   Dry and intact without significant lesions or rashes. Psychiatric: Oriented to person, place and time. Demonstrates good judgement and reason without abnormal affect or behaviors.  RELEVANT LABS AND IMAGING: CBC    Latest Ref Rng & Units 01/08/2024    4:20 PM 11/27/2023    2:53 PM 05/12/2023    8:23  PM  CBC  WBC 3.4 - 10.8 x10E3/uL 6.3  5.8  7.0   Hemoglobin 11.1 - 15.9 g/dL 04.5  40.9  81.1   Hematocrit 34.0 - 46.6 % 39.6  35.5  38.5   Platelets 150 - 450 x10E3/uL 324  321  349      CMP     Latest Ref Rng & Units 01/08/2024    4:20 PM 05/12/2023    8:23 PM 05/09/2023    9:40 AM  CMP  Glucose 70 - 99 mg/dL 914  92  84   BUN 6 - 20 mg/dL 11  9  7    Creatinine 0.57 - 1.00 mg/dL 7.82  9.56  2.13   Sodium 134 - 144 mmol/L 139  138  137   Potassium 3.5 - 5.2 mmol/L 4.2  3.6  3.5   Chloride 96 - 106 mmol/L 101  105  106   CO2 20 - 29 mmol/L 23  24  23    Calcium 8.7 - 10.2 mg/dL 9.4  8.9  8.8   Total Protein 6.5 - 8.1 g/dL   7.1   Total Bilirubin 0.3 - 1.2 mg/dL   0.6   Alkaline Phos 38 - 126 U/L   55   AST 15 - 41 U/L   10   ALT 0 - 44 U/L   14     Lab Results  Component Value Date   TSH 1.990 03/16/2023    Flexible sigmoidoscopy September 2013 for evaluation of iron deficiency anemia, abdominal pain and rectal bleeding.  Extent of exam to the descending colon.  Prep was excellent -Colonic mucosa appeared normal nothing found to explain the rectal bleeding or iron deficiency   November 2020 EGD for globus sensation -4 cm hiatal hernia,  exam otherwise normal.  No specimens collected  Assessment: 1. ***  Plan: 1. ***   Thank you for the courtesy of this consult. Please call me with any questions or concerns.   Jeniffer Culliver, FNP-C [facility] 01/20/2024, 11:43 AM  Cc: Bess Kinds, MD

## 2024-01-21 ENCOUNTER — Ambulatory Visit: Payer: Medicaid Other | Admitting: Gastroenterology

## 2024-01-28 DIAGNOSIS — F411 Generalized anxiety disorder: Secondary | ICD-10-CM | POA: Diagnosis not present

## 2024-02-04 ENCOUNTER — Encounter: Payer: Self-pay | Admitting: Family Medicine

## 2024-02-04 ENCOUNTER — Ambulatory Visit (INDEPENDENT_AMBULATORY_CARE_PROVIDER_SITE_OTHER): Payer: Medicaid Other | Admitting: Family Medicine

## 2024-02-04 VITALS — BP 110/78 | HR 79 | Ht 62.0 in | Wt 246.1 lb

## 2024-02-04 DIAGNOSIS — F411 Generalized anxiety disorder: Secondary | ICD-10-CM | POA: Diagnosis present

## 2024-02-04 DIAGNOSIS — K219 Gastro-esophageal reflux disease without esophagitis: Secondary | ICD-10-CM | POA: Diagnosis not present

## 2024-02-04 DIAGNOSIS — R079 Chest pain, unspecified: Secondary | ICD-10-CM | POA: Diagnosis not present

## 2024-02-04 MED ORDER — PANTOPRAZOLE SODIUM 40 MG PO TBEC: 40.0000 mg | DELAYED_RELEASE_TABLET | Freq: Every day | ORAL | 3 refills | Status: DC

## 2024-02-04 MED ORDER — ESCITALOPRAM OXALATE 20 MG PO TABS: 20.0000 mg | ORAL_TABLET | Freq: Every day | ORAL | 0 refills | Status: DC

## 2024-02-04 NOTE — Progress Notes (Signed)
    SUBJECTIVE:   CHIEF COMPLAINT / HPI:   Anxiety Patient feels like her anxiety has gotten worse lately. NO significant new life stressors identified. Has been taking the same dose of Lexapro for years. Has Atarax for PRN use but does not use much as it makes her sleepy. No current SI/HI. Has started therapy recently.   GERD  Chest tightness Notes upper abdomen pain that makes it difficult for her to breathe at times. Pain is worse after eating. Comes and goes. Has been taking 20 mg protonix, reports the 40mg  worked better in the past. A couple weeks ago, patient had an anxiety attack accompanied by chest tightness that lasted for 3 days. No chest pain currently. Difficulty breathing not exacerbated by exertion. Chest tightness and difficulty breathing only occurs with the stomach pain. Did not ever take Zepbound because insurance coverage.   PERTINENT  PMH / PSH: GAD, GERD, hiatal hernia, Asthma  OBJECTIVE:   BP 110/78   Pulse 79   Ht 5\' 2"  (1.575 m)   Wt 246 lb 2 oz (111.6 kg)   SpO2 98%   BMI 45.02 kg/m   General: Well-appearing. Resting comfortably in room. CV: Normal S1/S2. No extra heart sounds. Warm and well-perfused. Pulm: Breathing comfortably on room air. CTAB. No increased WOB. Abd: Soft, non-tender, non-distended. Skin:  Warm, dry. Psych: Pleasant and appropriate.   ASSESSMENT/PLAN:   Assessment & Plan GAD (generalized anxiety disorder) Worsening anxiety symptoms. No SI/HI. Suspect chest tightness and difficulty breathing secondary to anxiety. Has been on 10mg  dose of Lexapro for years without increase.  - Increase to Lexpro 20 mg daily  - Continue therapy Gastroesophageal reflux disease, unspecified whether esophagitis present Suspect worsening reflux symptoms in setting of decreased protonix. Appears that patient has both 20mg  and 40 mg prescriptions. - Refilled protonix 40 mg Chest pain, unspecified type EKG obtained today with normal findings, low concern  for ischemic etiology. Suspect symptoms secondary to anxiety and reflux.  - Increased Lexapro per above  - Protonix 40 mg per above - CTM  Return to clinic in 2 weeks for follow up.   Ivery Quale, MD [facility]

## 2024-02-04 NOTE — Patient Instructions (Addendum)
 Thank you for visiting clinic today - it is always our pleasure to care for you.  Today we discussed your mood. We increased your Lexpro dosage to 20mg  daily. If you notice any worsening of your symptoms, please return to your previous dose and let us know.   Your EKG today was normal. Please take the 40 mg of Protonix daily to help with your reflux symptoms. If this is not helping, please let us know.   Please schedule an appointment in 2 weeks for follow up.   Reach out any time with any questions or concerns you may have - we are here for you!  Ivery Quale, MD [facility] [phone]

## 2024-02-06 NOTE — Assessment & Plan Note (Signed)
 Worsening anxiety symptoms. No SI/HI. Suspect chest tightness and difficulty breathing secondary to anxiety. Has been on 10mg  dose of Lexapro for years without increase.  - Increase to Lexpro 20 mg daily  - Continue therapy

## 2024-02-06 NOTE — Assessment & Plan Note (Signed)
 Suspect worsening reflux symptoms in setting of decreased protonix. Appears that patient has both 20mg  and 40 mg prescriptions. - Refilled protonix 40 mg

## 2024-02-10 ENCOUNTER — Emergency Department (HOSPITAL_COMMUNITY)

## 2024-02-10 ENCOUNTER — Emergency Department (HOSPITAL_COMMUNITY)
Admission: EM | Admit: 2024-02-10 | Discharge: 2024-02-10 | Disposition: A | Discharge status: Home / Self Care | Attending: Emergency Medicine | Admitting: Emergency Medicine

## 2024-02-10 ENCOUNTER — Other Ambulatory Visit: Payer: Self-pay

## 2024-02-10 DIAGNOSIS — F419 Anxiety disorder, unspecified: Secondary | ICD-10-CM | POA: Diagnosis not present

## 2024-02-10 DIAGNOSIS — J45909 Unspecified asthma, uncomplicated: Secondary | ICD-10-CM | POA: Diagnosis not present

## 2024-02-10 DIAGNOSIS — R079 Chest pain, unspecified: Secondary | ICD-10-CM | POA: Diagnosis not present

## 2024-02-10 DIAGNOSIS — R0789 Other chest pain: Secondary | ICD-10-CM | POA: Insufficient documentation

## 2024-02-10 LAB — BASIC METABOLIC PANEL
Anion gap: 14 (ref 5–15)
BUN: 18 mg/dL (ref 6–20)
CO2: 21 mmol/L — ABNORMAL LOW (ref 22–32)
Calcium: 9.5 mg/dL (ref 8.9–10.3)
Chloride: 103 mmol/L (ref 98–111)
Creatinine, Ser: 1.17 mg/dL — ABNORMAL HIGH (ref 0.44–1.00)
GFR, Estimated: >60 mL/min (ref >60)
Glucose, Bld: 117 mg/dL — ABNORMAL HIGH (ref 70–99)
Potassium: 3.6 mmol/L (ref 3.5–5.1)
Sodium: 138 mmol/L (ref 135–145)

## 2024-02-10 LAB — TROPONIN I (HIGH SENSITIVITY)
Troponin I (High Sensitivity): 3 ng/L (ref <18)
Troponin I (High Sensitivity): 3 ng/L (ref <18)

## 2024-02-10 LAB — CBC
HCT: 39.9 % (ref 36.0–46.0)
Hemoglobin: 13.3 g/dL (ref 12.0–15.0)
MCH: 28.2 pg (ref 26.0–34.0)
MCHC: 33.3 g/dL (ref 30.0–36.0)
MCV: 84.5 fL (ref 80.0–100.0)
Platelets: 380 10*3/uL (ref 150–400)
RBC: 4.72 MIL/uL (ref 3.87–5.11)
RDW: 13.5 % (ref 11.5–15.5)
WBC: 9.5 10*3/uL (ref 4.0–10.5)
nRBC: 0 % (ref 0.0–0.2)

## 2024-02-10 LAB — HCG, SERUM, QUALITATIVE: Preg, Serum: NEGATIVE

## 2024-02-10 NOTE — Discharge Instructions (Signed)
 You were seen in the emergency department for chest pain shortness of breath.  You had blood work EKG chest x-ray that did not show any serious findings.  There was no evidence of heart attack.  This may be related to your anxiety.  Please continue your anxiety medication and follow-up with your therapist as scheduled.  Return to the emergency department if any worsening or concerning symptoms.

## 2024-02-10 NOTE — ED Triage Notes (Signed)
 Pt states that she left the gym at approx 1930 last night and had an anxiety attack. Pt states that she began having dull chest pressure and SOB and wanted to come be checked out to make sure it wasn't cardiac related.

## 2024-02-10 NOTE — ED Provider Notes (Signed)
 [place] EMERGENCY DEPARTMENT AT [facility] Provider Note   CSN: [id number] Arrival date & time: 02/10/24  0211     History  Chief Complaint  Patient presents with   Anxiety   Chest Pain    Veronica [name] is a 33 y.o. female.  She has a history of asthma and anxiety.  She said she was coming home from the gym when she began experiencing feeling anxious.  It progressed to pain across her chest and shortness of breath.  She has had anxiety before but is never caused chest pain.  She had an EKG at triage and lab work and chest x-ray.  She feels her symptoms are somewhat improved although she still has some tightness across her chest.  She was initially tachycardic on arrival.  She denies any stimulants or drugs.  She is seeing a therapist but its only been a few weeks.  She is on medication for anxiety  The history is provided by the patient.  Anxiety This is a recurrent problem. The current episode started 6 to 12 hours ago. The problem has been gradually improving. Associated symptoms include chest pain and shortness of breath. Pertinent negatives include no abdominal pain. Nothing aggravates the symptoms. Nothing relieves the symptoms. Treatments tried: anxiety medication. The treatment provided mild relief.  Chest Pain Pain location:  Substernal area Pain quality: pressure   Pain severity:  Moderate Chronicity:  New Relieved by:  Nothing Associated symptoms: anxiety and shortness of breath   Associated symptoms: no abdominal pain, no fever and no syncope        Home Medications Prior to Admission medications   Medication Sig Start Date End Date Taking? Authorizing Provider  albuterol (VENTOLIN HFA) 108 (90 Base) MCG/ACT inhaler INHALE 2 PUFFS BY MOUTH EVERY 4 HOURS AS NEEDED FOR WHEEZING OR SHORTNESS OF BREATH (COUGH). 11/13/23   Carney Living, MD  Blood Pressure KIT Please use to check blood pressure as needed. 07/15/23   Westley Chandler, MD  escitalopram  (LEXAPRO) 20 MG tablet Take 1 tablet (20 mg total) by mouth daily. 02/04/24   Ivery Quale, MD  etonogestrel (NEXPLANON) 68 MG IMPL implant 1 each by Subdermal route once.    [provider]  ferrous sulfate (FEROSUL) 325 (65 FE) MG tablet TAKE 1 TABLET BY MOUTH EVERY OTHER DAY 01/18/24   Bess Kinds, MD  fluticasone (FLOVENT HFA) 110 MCG/ACT inhaler Inhale 2 puffs into the lungs 2 (two) times daily. 11/13/23   Carney Living, MD  hydroxypropyl methylcellulose / hypromellose (ISOPTO TEARS / GONIOVISC) 2.5 % ophthalmic solution Place 1 drop into both eyes 4 (four) times daily as needed for dry eyes. 10/24/23   Raspet, Noberto Retort, [name]  hydrOXYzine (ATARAX) 25 MG tablet Take 1 tablet (25 mg total) by mouth 3 (three) times daily as needed for anxiety. 11/13/23   Carney Living, MD  lidocaine (XYLOCAINE) 2 % solution Use as directed 15 mLs in the mouth or throat as needed for mouth pain. 12/27/23   Wynonia Lawman A, NP  loratadine (CLARITIN) 10 MG tablet Take 1 tablet (10 mg total) by mouth daily. 05/22/23   Cora Collum, DO  pantoprazole (PROTONIX) 40 MG tablet Take 1 tablet (40 mg total) by mouth daily. 02/04/24   Ivery Quale, MD  polyethylene glycol (MIRALAX / GLYCOLAX) 17 g packet Take 17 g by mouth 2 (two) times daily. 05/15/23   Valetta Close, MD  senna (SENOKOT) 8.6 MG TABS tablet  Take 1 tablet (8.6 mg total) by mouth 2 (two) times daily. 05/15/23   Valetta Close, MD  trimethoprim-polymyxin b Joaquim Lai) ophthalmic solution Place 1 drop into the left eye every 6 (six) hours. 10/24/23   Raspet, Noberto Retort, [name]      Allergies    Morphine, Ibuprofen, and Morphine and codeine    Review of Systems   Review of Systems  Constitutional:  Negative for fever.  Respiratory:  Positive for shortness of breath.   Cardiovascular:  Positive for chest pain. Negative for syncope.  Gastrointestinal:  Negative for abdominal pain.    Physical Exam Updated Vital Signs BP 133/84   Pulse  85   Temp 98.9 F (37.2 C)   Resp 17   SpO2 100%  Physical Exam Vitals and nursing note reviewed.  Constitutional:      General: She is not in acute distress.    Appearance: She is well-developed.  HENT:     Head: Normocephalic and atraumatic.  Eyes:     Conjunctiva/sclera: Conjunctivae normal.  Cardiovascular:     Rate and Rhythm: Normal rate and regular rhythm.     Heart sounds: Normal heart sounds. No murmur heard. Pulmonary:     Effort: Pulmonary effort is normal. No respiratory distress.     Breath sounds: Normal breath sounds.  Abdominal:     Palpations: Abdomen is soft.     Tenderness: There is no abdominal tenderness.  Musculoskeletal:        General: No swelling.     Cervical back: Neck supple.     Right lower leg: No tenderness. No edema.     Left lower leg: No tenderness. No edema.  Skin:    General: Skin is warm and dry.     Capillary Refill: Capillary refill takes less than 2 seconds.  Neurological:     General: No focal deficit present.     Mental Status: She is alert.     ED Results / Procedures / Treatments   Labs (all labs ordered are listed, but only abnormal results are displayed) Labs Reviewed  BASIC METABOLIC PANEL - Abnormal; Notable for the following components:      Result Value   CO2 21 (*)    Glucose, Bld 117 (*)    Creatinine, Ser 1.17 (*)    All other components within normal limits  CBC  HCG, SERUM, QUALITATIVE  TROPONIN I (HIGH SENSITIVITY)  TROPONIN I (HIGH SENSITIVITY)    EKG None  Radiology DG Chest 2 View Result Date: 02/10/2024 CLINICAL DATA:  Chest pain EXAM: CHEST - 2 VIEW COMPARISON:  05/12/2023 FINDINGS: The heart size and mediastinal contours are within normal limits. Both lungs are clear. The visualized skeletal structures are unremarkable. IMPRESSION: No active cardiopulmonary disease. Electronically Signed   By: Alcide Clever M.D.   On: 02/10/2024 03:03    Procedures Procedures    Medications Ordered in  ED Medications - No data to display  ED Course/ Medical Decision Making/ A&P Clinical Course as of 02/10/24 0830  Wed Feb 10, 2024  0829 EKG not crossing in epic.  Sinus tachycardia, no acute ST-T changes.  Similar to prior EKG [MB]    Clinical Course User Index [MB] Terrilee Files, MD                                 Medical Decision Making Amount and/or Complexity of Data Reviewed Labs: ordered.  Radiology: ordered.   This patient complains of chest tightness shortness of breath; this involves an extensive number of treatment Options and is a complaint that carries with it a high risk of complications and morbidity. The differential includes anxiety, ACS, pneumonia, pneumothorax, PE, vascular  I ordered, reviewed and interpreted labs, which included CBC normal chemistries normal other than mildly elevated glucose and low bicarb, pregnancy negative, troponins flat I ordered imaging studies which included chest x-ray and I independently    visualized and interpreted imaging which showed no acute findings Previous records obtained and reviewed in epic no recent admissions  Cardiac monitoring reviewed, normal sinus rhythm Social determinants considered, stress Critical Interventions: None  After the interventions stated above, I reevaluated the patient and found patient to be still feeling symptoms although improved from when she arrived Admission and further testing considered, no indications for admission or further workup at this time.  PERC negative.  Recommended close follow-up with outpatient treatment team.  Return instructions discussed         Final Clinical Impression(s) / ED Diagnoses Final diagnoses:  Anxiety  Nonspecific chest pain    Rx / DC Orders ED Discharge Orders     None         Terrilee Files, MD 02/10/24 1655

## 2024-02-13 ENCOUNTER — Encounter (HOSPITAL_COMMUNITY): Payer: Self-pay

## 2024-02-13 ENCOUNTER — Emergency Department (HOSPITAL_COMMUNITY)
Admission: EM | Admit: 2024-02-13 | Discharge: 2024-02-13 | Disposition: A | Discharge status: Home / Self Care | Attending: Emergency Medicine | Admitting: Emergency Medicine

## 2024-02-13 DIAGNOSIS — F410 Panic disorder [episodic paroxysmal anxiety] without agoraphobia: Secondary | ICD-10-CM | POA: Diagnosis not present

## 2024-02-13 DIAGNOSIS — F411 Generalized anxiety disorder: Secondary | ICD-10-CM | POA: Diagnosis present

## 2024-02-13 DIAGNOSIS — R0789 Other chest pain: Secondary | ICD-10-CM | POA: Diagnosis not present

## 2024-02-13 DIAGNOSIS — I10 Essential (primary) hypertension: Secondary | ICD-10-CM | POA: Diagnosis not present

## 2024-02-13 DIAGNOSIS — F419 Anxiety disorder, unspecified: Secondary | ICD-10-CM | POA: Diagnosis not present

## 2024-02-13 DIAGNOSIS — R0602 Shortness of breath: Secondary | ICD-10-CM | POA: Diagnosis not present

## 2024-02-13 LAB — CBC WITH DIFFERENTIAL/PLATELET
Abs Immature Granulocytes: 0.03 10*3/uL (ref 0.00–0.07)
Basophils Absolute: 0.1 10*3/uL (ref 0.0–0.1)
Basophils Relative: 1 %
Eosinophils Absolute: 0 10*3/uL (ref 0.0–0.5)
Eosinophils Relative: 0 %
HCT: 42 % (ref 36.0–46.0)
Hemoglobin: 13.9 g/dL (ref 12.0–15.0)
Immature Granulocytes: 0 %
Lymphocytes Relative: 19 %
Lymphs Abs: 1.9 10*3/uL (ref 0.7–4.0)
MCH: 28.3 pg (ref 26.0–34.0)
MCHC: 33.1 g/dL (ref 30.0–36.0)
MCV: 85.5 fL (ref 80.0–100.0)
Monocytes Absolute: 0.4 10*3/uL (ref 0.1–1.0)
Monocytes Relative: 4 %
Neutro Abs: 7.5 10*3/uL (ref 1.7–7.7)
Neutrophils Relative %: 76 %
Platelets: 400 10*3/uL (ref 150–400)
RBC: 4.91 MIL/uL (ref 3.87–5.11)
RDW: 13.6 % (ref 11.5–15.5)
WBC: 10 10*3/uL (ref 4.0–10.5)
nRBC: 0 % (ref 0.0–0.2)

## 2024-02-13 LAB — COMPREHENSIVE METABOLIC PANEL
ALT: 19 U/L (ref 0–44)
AST: 16 U/L (ref 15–41)
Albumin: 4.3 g/dL (ref 3.5–5.0)
Alkaline Phosphatase: 69 U/L (ref 38–126)
Anion gap: 11 (ref 5–15)
BUN: 10 mg/dL (ref 6–20)
CO2: 24 mmol/L (ref 22–32)
Calcium: 10.1 mg/dL (ref 8.9–10.3)
Chloride: 102 mmol/L (ref 98–111)
Creatinine, Ser: 1.06 mg/dL — ABNORMAL HIGH (ref 0.44–1.00)
GFR, Estimated: >60 mL/min (ref >60)
Glucose, Bld: 90 mg/dL (ref 70–99)
Potassium: 3.7 mmol/L (ref 3.5–5.1)
Sodium: 137 mmol/L (ref 135–145)
Total Bilirubin: 0.8 mg/dL (ref 0.0–1.2)
Total Protein: 8.7 g/dL — ABNORMAL HIGH (ref 6.5–8.1)

## 2024-02-13 LAB — TROPONIN I (HIGH SENSITIVITY): Troponin I (High Sensitivity): 3 ng/L (ref <18)

## 2024-02-13 MED ORDER — ESCITALOPRAM OXALATE 10 MG PO TABS: 15.0000 mg | ORAL_TABLET | Freq: Every day | ORAL | Status: DC

## 2024-02-13 MED ORDER — HYDROXYZINE HCL 25 MG PO TABS: 50.0000 mg | ORAL_TABLET | Freq: Three times a day (TID) | ORAL | 0 refills | Status: DC | PRN

## 2024-02-13 MED ORDER — LORAZEPAM 1 MG PO TABS: 1.0000 mg | ORAL_TABLET | Freq: Two times a day (BID) | ORAL | 0 refills | Status: DC | PRN

## 2024-02-13 MED ORDER — LORAZEPAM 1 MG PO TABS
2.0000 mg | ORAL_TABLET | Freq: Once | ORAL | Status: AC
Start: 2024-02-13 — End: 2024-02-13
  Administered 2024-02-13: 2 mg via ORAL
  Filled 2024-02-13: qty 2

## 2024-02-13 MED ORDER — ESCITALOPRAM OXALATE 5 MG PO TABS: 15.0000 mg | ORAL_TABLET | Freq: Every day | ORAL | 0 refills | Status: DC

## 2024-02-13 NOTE — ED Triage Notes (Addendum)
 Per EMS, pt, from home, c/o anxiety x3 days.  Denies pain.  Pt reports having Lexapro increased x3 days ago.    NAD noted.  Pt easily speaking full sentences.    Pt noted to be fixated on vital signs and anxious they are high.

## 2024-02-13 NOTE — Consult Note (Cosign Needed Addendum)
 [facility] Psychiatric Consult Initial  Patient Name: .Veronica [name]  MRN: [id number]  DOB: 14-Jul-1991  Consult Order details:  Orders (From admission, onward)     Start     Ordered   02/13/24 1525  CONSULT TO CALL ACT TEAM       Ordering Provider: Heide Scales, MD  Provider:  (Not yet assigned)  Question:  Reason for Consult?  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 on lexapro and recently was increased. bounced back for worsend episodes   02/13/24 1524             Mode of Visit: In person    Psychiatry Consult Evaluation  Service Date: February 13, 2024 LOS:  LOS: 0 days  Chief Complaint: "I've just been having trouble with panic attacks lately and I don't know why"  Primary Psychiatric Diagnoses  Panic attacks 2.  GAD (generalized anxiety disorder)   Assessment   Veronica [name] is a 33 y.o. AA female with a past psychiatric history of GAD, with pertinent medical comorbidities/history that includes asthma and obesity, who presented this encounter by way of self with endorsements of difficult to control anxiety.  Patient is currently medically clear at this time, as well as voluntary, per EDP team.  Upon evaluation, patient presents with symptomology that is most consistent with an acute decompensation of the patient's primary mental health condition of GAD and panic attacks. Evidence of this is appreciable from the patient's endorsements of growing and progressively difficult to control anxiety over the last several days, which has now evolved into the development of having panic attacks.  Discussed with patient that it is very likely that while the psychosocial stressors that she has been experiencing have caused an overall increase in troubles with anxiety, the recent doubling of the patient's Lexapro from 10 mg to 20 mg has very likely also contributed to an increase in anxiety and now panic  attacks.  Discussed that this provider's recommendation would be to reduce the patient's Lexapro to 15 mg p.o. daily, with further titrations as needed, follow-up closely with the patient's outpatient provider for medication management, utilize hydroxyzine as needed with adjustments recommended, and if needed further, utilize Ativan as needed as needed for onset of severe panic attack, and if severe panic attacks continue to be appreciable, further in-depth conversation needs to be had with the patient's primary care provider who is addressing her medication management at this time.  Patient verbalized understanding.  From evaluation conducted, patient does not present as an imminent risk for self or others, thus the recommendation is for psychiatric clearance, as well as the additional recommendations listed below.  Spoke to Dr. Enedina Finner who who agrees with recommendation for discharge, as well as additional recommendations listed below.  Diagnoses:  Active Hospital problems: Principal Problem:   Panic attacks Active Problems:   GAD (generalized anxiety disorder)    Plan   # GAD # Panic attacks  ## Psychiatric Medication Recommendations:   -Continue/decrease Lexapro from 20 mg p.o. daily--> 15 mg p.o. daily -Continue/increase hydroxyzine from 25 mg p.o. 3 times daily as needed --> 50 mg p.o. 3 times daily as needed for anxiety -Recommend Ativan 1 mg p.o. as needed twice daily for onset of severe panic attack; if needed for more than 3 days in a row, please schedule close follow-up with medication management outpatient provider -Recommend close outpatient follow-up with the patient's  medication management provider -Recommend if safety concerns arise, please return back to emergency services for safety and further care  ## Medical Decision Making Capacity: Not specifically addressed in this encounter  ## Further Work-up: None at this time  ## Disposition:-- There are no psychiatric  contraindications to discharge at this time  ## Behavioral / Environmental: - No specific recommendations at this time.     ## Safety and Observation Level:  - Based on my clinical evaluation, I estimate the patient to be at low risk of self harm in the current setting and upon recommendation for discharge. - At this time, we recommend  routine. This decision is based on my review of the chart including patient's history and current presentation, interview of the patient, mental status examination, and consideration of suicide risk including evaluating suicidal ideation, plan, intent, suicidal or self-harm behaviors, risk factors, and protective factors. This judgment is based on our ability to directly address suicide risk, implement suicide prevention strategies, and develop a safety plan while the patient is in the clinical setting. Please contact our team if there is a concern that risk level has changed.  CSSR Risk Category:C-SSRS RISK CATEGORY: No Risk  Suicide Risk Assessment: Patient has following modifiable risk factors for suicide: None, which we are addressing by treatment recommendations/evaluation. Patient has following non-modifiable or demographic risk factors for suicide: None Patient has the following protective factors against suicide: Access to outpatient mental health care, Supportive family, Supportive friends, Cultural, spiritual, or religious beliefs that discourage suicide, Pets in the home, Frustration tolerance, no history of suicide attempts, and no history of NSSIB  Thank you for this consult request. Recommendations have been communicated to the primary team.  We will sign off at this time.   Lenox Ponds, NP       History of Present Illness   Veronica [name] is a 33 y.o. AA female with a past psychiatric history of GAD, with pertinent medical comorbidities/history that includes asthma and obesity, who presented this encounter by way of self with endorsements  of difficult to control anxiety.  Patient is currently medically clear at this time, as well as voluntary, per EDP team.  Patient seen today at the [facility] emergency department for face-to-face psychiatric evaluation.  Patient endorses that she has presented this encounter, because she tried to use her as needed medication given to her by her primary care provider for when she has increased anxiety and panic, but after trying the recommended as needed medication with no success, states that she became so overwhelmed and with chest pain, that she felt that she needed to come in to get checked out.  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   Patient endorses that because of growing anxiety she recently had her Lexapro 10 mg p.o. daily that she has been on for many years increased to try to help by her primary care provider, but states that unfortunately she feels that this has actually made things worse, and that over the last several days now,  she has been having panic attacks, of which has led to recent emergency department visits. Per chart review, presented to the emergency department on 02/10/2024 for panic attack and troubles with anxiety, and after medical clearance, was subsequently sent home to follow-up with outpatient resources.  Patient endorses that in addition to Lexapro 20 mg p.o. daily, she is additionally on hydroxyzine 25 mg p.o. 3 times daily as needed, states that she feels that the hydroxyzine helps "a little", but the anxiety and development of panic attacks  has been resistant.  Patient endorses that she feels that she is tolerating the Lexapro 20 mg p.o. daily, and her hydroxyzine, just states that the main issue is the anxiety and new onset of panic attacks.  Patient endorses that since about, "Tuesday or Wednesday" she has had approximately 3-4 panic attacks.  Patient endorses normal sleeping and eating.  Patient endorses no problems with depression or depressive symptomology.  Patient endorses no mental health history outside of GAD that is treated by her primary care provider.  Patient endorses no inpatient and/or outpatient mental health service utilization, outside of primary care treatment and management.  Patient endorses no drugs, tobacco, and/or EtOH use.  Patient endorses no history of suicide attempts and/or self-injurious behavior.  Patient endorses no homicidal ideations, denies auditory and or visual hallucinations, and objectively, does not appear to be presenting with psychotic features.  Discussed with patient recommendations for as needed medication for aborting severe panic attacks, adjustments that can be made for the patient's current as needed medication, as well as the recommendation to reduce the patient's current Lexapro down to 15 mg; after extensive review and discussion, patient verbalized understanding, and expressed appreciation for adjustments and recommendations.  Patient endorsed no problem with safe discharge to follow-up with outpatient resources.  Review of Systems  Psychiatric/Behavioral:  Negative for depression, hallucinations, substance abuse and suicidal ideas. The patient is not nervous/anxious and does not have insomnia.   All other systems reviewed and are negative.   Psychiatric and Social History  Psychiatric History:  Information collected from Chart review/patient  Prev Dx/Sx: GAD panic attacks Current Psych Provider: Dr. Threasa Beards Home Meds (current): Lexapro 20 mg p.o. daily, hydroxyzine 25 mg p.o. 3 times  daily as needed Previous Med Trials: Lexapro 10 mg p.o. daily Therapy: None endorsed  Prior Psych Hospitalization: None endorsed Prior Self Harm: None endorsed Prior Violence: None endorsed  Family Psych History: None endorsed Family Hx suicide: None endorsed  Social History:  Developmental Hx: WDL Educational Hx: Some college Occupational Hx: Works as a Software engineer Hx: None reported Living Situation: Lives with family Spiritual Hx: None reported  Access to weapons/lethal means: None reported  Substance History Alcohol: Denies Type of alcohol : Denies Last Drink : Denies Number of drinks per day : Denies History of alcohol withdrawal seizures : Denies History of DT's : Denies Tobacco: Denies Illicit drugs: Denies Prescription drug abuse: Denies Rehab hx: Denies  Exam Findings  Physical Exam: As below Vital Signs:  Temp:  [99 F (37.2 C)] 99 F (37.2 C) (03/08 1436) Pulse Rate:  [85] 85 (03/08 1436) Resp:  [18] 18 (03/08 1436) BP: (129)/(78) 129/78 (03/08 1436) SpO2:  [97 %-100 %] 100 % (03/08 1436) Weight:  [111.6 kg] 111.6 kg (03/08 1436) Blood pressure 129/78, pulse 85, temperature 99 F (37.2 C), temperature source Oral, resp. rate 18, height 5\' 2"  (1.575 m), weight 111.6 kg, SpO2 100%. Body mass index is 44.99 kg/m.  Physical Exam Vitals and  nursing note reviewed.  Constitutional:      General: She is not in acute distress.    Appearance: She is obese. She is not ill-appearing, toxic-appearing or diaphoretic.  Pulmonary:     Effort: Pulmonary effort is normal.  Skin:    General: Skin is warm and dry.  Neurological:     Mental Status: She is alert and oriented to person, place, and time.     Motor: No weakness or tremor.  Psychiatric:        Attention and Perception: Attention and perception normal. She does not perceive auditory or visual hallucinations.        Mood and Affect: Mood and affect normal.        Speech: Speech normal.        Behavior:  Behavior is not agitated, slowed, aggressive, withdrawn, hyperactive or combative. Behavior is cooperative.        Thought Content: Thought content normal. Thought content is not paranoid or delusional. Thought content does not include homicidal or suicidal ideation.        Cognition and Memory: Cognition and memory normal.        Judgment: Judgment normal.   Mental Status Exam: General Appearance: Casual  Orientation:  Full (Time, Place, and Person)  Memory:   WDL  Concentration:  Concentration: Good and Attention Span: Good  Recall:  Good  Attention  Good  Eye Contact:  Good  Speech:  Clear and Coherent and Normal Rate  Language:  Good  Volume:  Normal  Mood: Euthymic, has been anxious though  Affect:  Appropriate  Thought Process:  Coherent, Goal Directed, and Linear  Thought Content:  Logical  Suicidal Thoughts:  No  Homicidal Thoughts:  No  Judgement:  Intact  Insight:  Good  Psychomotor Activity:  Normal  Akathisia:  No  Fund of Knowledge:  Good      Assets:  Communication Skills Desire for Improvement Financial Resources/Insurance Housing Intimacy Leisure Time Physical Health Resilience Social Support Talents/Skills Transportation Vocational/Educational  Cognition:  WNL  ADL's:  Intact  AIMS (if indicated):   0     Other History   These have been pulled in through the EMR, reviewed, and updated if appropriate.  Family History:  The patient's family history includes Diabetes in her mother and sister; Heart attack (age of onset: 75) in her mother; Hypertension in her mother and sister; Obesity in her mother and sister; Osteochondroma in her sister.  Medical History: Past Medical History:  Diagnosis Date   Abdominal pain 01/06/2012   Acute tension headache 07/27/2019   Anemia    Ankle pain    Anxiety    Asthma    Birth control counseling 08/26/2011   BMI 40.0-44.9, adult (HCC) 02/27/2011   Dysuria 01/13/2013   Epigastric pain 09/13/2019   GERD  (gastroesophageal reflux disease)    Heart murmur    Hiatal hernia    Obesity (BMI 30-39.9)    Pre-eclampsia    off med since 4/23   Secondary amenorrhea 03/25/2018   Trichomonas infection 06/17/2022    Surgical History: Past Surgical History:  Procedure Laterality Date   CESAREAN SECTION  09/02/2010   For macrosomia, but son was 7 lb 12 oz   CESAREAN SECTION N/A 10/06/2021   Procedure: CESAREAN SECTION;  Surgeon: Belview Bing, MD;  Location: [facility] ORS;  Service: Obstetrics;  Laterality: N/A;   CHOLECYSTECTOMY N/A 06/16/2022   Procedure: LAPAROSCOPIC  CHOLECYSTECTOMY;  Surgeon: Berna Bue, MD;  Location:  [facility] ORS;  Service: General;  Laterality: N/A;   TONSILLECTOMY AND ADENOIDECTOMY       Medications:  No current facility-administered medications for this encounter.  Current Outpatient Medications:    albuterol (VENTOLIN HFA) 108 (90 Base) MCG/ACT inhaler, INHALE 2 PUFFS BY MOUTH EVERY 4 HOURS AS NEEDED FOR WHEEZING OR SHORTNESS OF BREATH (COUGH)., Disp: 18 g, Rfl: 1   Blood Pressure KIT, Please use to check blood pressure as needed., Disp: 1 kit, Rfl: 0   escitalopram (LEXAPRO) 20 MG tablet, Take 1 tablet (20 mg total) by mouth daily., Disp: 30 tablet, Rfl: 0   etonogestrel (NEXPLANON) 68 MG IMPL implant, 1 each by Subdermal route once., Disp: , Rfl:    ferrous sulfate (FEROSUL) 325 (65 FE) MG tablet, TAKE 1 TABLET BY MOUTH EVERY OTHER DAY, Disp: 30 tablet, Rfl: 2   fluticasone (FLOVENT HFA) 110 MCG/ACT inhaler, Inhale 2 puffs into the lungs 2 (two) times daily., Disp: 1 each, Rfl: 1   hydroxypropyl methylcellulose / hypromellose (ISOPTO TEARS / GONIOVISC) 2.5 % ophthalmic solution, Place 1 drop into both eyes 4 (four) times daily as needed for dry eyes., Disp: 15 mL, Rfl: 0   hydrOXYzine (ATARAX) 25 MG tablet, Take 1 tablet (25 mg total) by mouth 3 (three) times daily as needed for anxiety., Disp: 90 tablet, Rfl: 0   lidocaine (XYLOCAINE) 2 % solution, Use as directed 15  mLs in the mouth or throat as needed for mouth pain., Disp: 100 mL, Rfl: 0   loratadine (CLARITIN) 10 MG tablet, Take 1 tablet (10 mg total) by mouth daily., Disp: 30 tablet, Rfl: 11   pantoprazole (PROTONIX) 40 MG tablet, Take 1 tablet (40 mg total) by mouth daily., Disp: 30 tablet, Rfl: 3   polyethylene glycol (MIRALAX / GLYCOLAX) 17 g packet, Take 17 g by mouth 2 (two) times daily., Disp: 14 each, Rfl: 1   senna (SENOKOT) 8.6 MG TABS tablet, Take 1 tablet (8.6 mg total) by mouth 2 (two) times daily., Disp: 120 tablet, Rfl: 0   trimethoprim-polymyxin b (POLYTRIM) ophthalmic solution, Place 1 drop into the left eye every 6 (six) hours., Disp: 10 mL, Rfl: 0  Allergies: Allergies  Allergen Reactions   Morphine Anaphylaxis   Ibuprofen Nausea And Vomiting    Was told to not take this because it would flare stomach issues   Morphine And Codeine Other (See Comments)    Causes "chest to be heavy"    Lenox Ponds, NP

## 2024-02-13 NOTE — Discharge Instructions (Signed)
 Please continue/decrease Lexapro from 20 mg by mouth  daily--> 15 mg by mouth  daily Please continue/increase hydroxyzine from 25 mg by mouth  3 times daily as needed --> 50 mg by mouth 3 times daily as needed for anxiety Please utilize Ativan 1 mg by mouth  as needed twice daily for onset of severe panic attack; if needed for more than 3 days in a row, please schedule close follow-up with medication management outpatient provider Please perform close outpatient follow-up with the patient's medication management provider Please return if safety concerns arise back to emergency services for safety and further care

## 2024-02-13 NOTE — ED Provider Notes (Signed)
 [place] EMERGENCY DEPARTMENT AT [facility] Provider Note   CSN: [id number] Arrival date & time: 02/13/24  1431     History {Add pertinent medical, surgical, social history, OB history to HPI:1} Chief Complaint  Patient presents with  . Anxiety    Veronica [name] is a 33 y.o. female.  The history is provided by the patient and medical records. No language interpreter was used.  Anxiety This is a recurrent problem. The current episode started more than 2 days ago. The problem occurs daily. The problem has not changed since onset.Associated symptoms include chest pain. Pertinent negatives include no abdominal pain, no headaches and no shortness of breath. Nothing aggravates the symptoms. Nothing relieves the symptoms. She has tried nothing for the symptoms. The treatment provided no relief.       Home Medications Prior to Admission medications   Medication Sig Start Date End Date Taking? Authorizing Provider  albuterol (VENTOLIN HFA) 108 (90 Base) MCG/ACT inhaler INHALE 2 PUFFS BY MOUTH EVERY 4 HOURS AS NEEDED FOR WHEEZING OR SHORTNESS OF BREATH (COUGH). 11/13/23   Carney Living, MD  Blood Pressure KIT Please use to check blood pressure as needed. 07/15/23   Westley Chandler, MD  escitalopram (LEXAPRO) 20 MG tablet Take 1 tablet (20 mg total) by mouth daily. 02/04/24   Ivery Quale, MD  etonogestrel (NEXPLANON) 68 MG IMPL implant 1 each by Subdermal route once.    [provider]  ferrous sulfate (FEROSUL) 325 (65 FE) MG tablet TAKE 1 TABLET BY MOUTH EVERY OTHER DAY 01/18/24   Bess Kinds, MD  fluticasone (FLOVENT HFA) 110 MCG/ACT inhaler Inhale 2 puffs into the lungs 2 (two) times daily. 11/13/23   Carney Living, MD  hydroxypropyl methylcellulose / hypromellose (ISOPTO TEARS / GONIOVISC) 2.5 % ophthalmic solution Place 1 drop into both eyes 4 (four) times daily as needed for dry eyes. 10/24/23   Raspet, Noberto Retort, [name]  hydrOXYzine (ATARAX) 25 MG tablet  Take 1 tablet (25 mg total) by mouth 3 (three) times daily as needed for anxiety. 11/13/23   Carney Living, MD  lidocaine (XYLOCAINE) 2 % solution Use as directed 15 mLs in the mouth or throat as needed for mouth pain. 12/27/23   Wynonia Lawman A, NP  loratadine (CLARITIN) 10 MG tablet Take 1 tablet (10 mg total) by mouth daily. 05/22/23   Cora Collum, DO  pantoprazole (PROTONIX) 40 MG tablet Take 1 tablet (40 mg total) by mouth daily. 02/04/24   Ivery Quale, MD  polyethylene glycol (MIRALAX / GLYCOLAX) 17 g packet Take 17 g by mouth 2 (two) times daily. 05/15/23   Valetta Close, MD  senna (SENOKOT) 8.6 MG TABS tablet Take 1 tablet (8.6 mg total) by mouth 2 (two) times daily. 05/15/23   Valetta Close, MD  trimethoprim-polymyxin b Joaquim Lai) ophthalmic solution Place 1 drop into the left eye every 6 (six) hours. 10/24/23   Raspet, Noberto Retort, [name]      Allergies    Morphine, Ibuprofen, and Morphine and codeine    Review of Systems   Review of Systems  Constitutional:  Negative for chills, fatigue and fever.  HENT:  Negative for congestion.   Respiratory:  Negative for cough, chest tightness, shortness of breath and wheezing.   Cardiovascular:  Positive for chest pain. Negative for palpitations.  Gastrointestinal:  Negative for abdominal pain, constipation, diarrhea, nausea and vomiting.  Genitourinary:  Negative for dysuria and flank pain.  Musculoskeletal:  Negative  for back pain, neck pain and neck stiffness.  Skin:  Negative for rash and wound.  Neurological:  Negative for dizziness, weakness, light-headedness and headaches.  Psychiatric/Behavioral:  Negative for agitation and confusion. The patient is nervous/anxious.   All other systems reviewed and are negative.   Physical Exam Updated Vital Signs BP 129/78 (BP Location: Left Arm)   Pulse 85   Temp 99 F (37.2 C) (Oral)   Resp 18   Ht 5\' 2"  (1.575 m)   Wt 111.6 kg   SpO2 100%   BMI 44.99 kg/m  Physical  Exam Vitals and nursing note reviewed.  Constitutional:      General: She is not in acute distress.    Appearance: She is well-developed. She is not ill-appearing, toxic-appearing or diaphoretic.  HENT:     Head: Normocephalic and atraumatic.     Nose: No congestion or rhinorrhea.     Mouth/Throat:     Mouth: Mucous membranes are moist.  Eyes:     Extraocular Movements: Extraocular movements intact.     Conjunctiva/sclera: Conjunctivae normal.     Pupils: Pupils are equal, round, and reactive to light.  Cardiovascular:     Rate and Rhythm: Normal rate and regular rhythm.     Heart sounds: No murmur heard. Pulmonary:     Effort: Pulmonary effort is normal. No respiratory distress.     Breath sounds: Normal breath sounds.  Abdominal:     Palpations: Abdomen is soft.     Tenderness: There is no abdominal tenderness. There is no right CVA tenderness or left CVA tenderness.  Musculoskeletal:        General: No swelling or tenderness.     Cervical back: Neck supple.     Right lower leg: No edema.     Left lower leg: No edema.  Skin:    General: Skin is warm and dry.     Capillary Refill: Capillary refill takes less than 2 seconds.     Findings: No erythema.  Neurological:     General: No focal deficit present.     Mental Status: She is alert.     Sensory: No sensory deficit.     Motor: No weakness.  Psychiatric:        Mood and Affect: Mood is anxious.    ED Results / Procedures / Treatments   Labs (all labs ordered are listed, but only abnormal results are displayed) Labs Reviewed - No data to display  EKG None  Radiology No results found.  Procedures Procedures  {Document cardiac monitor, telemetry assessment procedure when appropriate:1}  Medications Ordered in ED Medications - No data to display  ED Course/ Medical Decision Making/ A&P   {   Click here for ABCD2, HEART and other calculatorsREFRESH Note before signing :1}                               Medical Decision Making Amount and/or Complexity of Data Reviewed Labs: ordered.  Risk Prescription drug management.   ***   8:32 PM Patient reassessed and she is feeling better.  Psychiatry saw her and feel she needs a prescription for short course of Ativan to help as rescue medication and they will make changes to her hydroxyzine and Lexapro.  Patient agrees to this plan and follow-up and will be discharged for outpatient follow-up.  {Document critical care time when appropriate:1} {Document review of labs and clinical decision tools ie  heart score, Chads2Vasc2 etc:1}  {Document your independent review of radiology images, and any outside records:1} {Document your discussion with family members, caretakers, and with consultants:1} {Document social determinants of health affecting pt's care:1} {Document your decision making why or why not admission, treatments were needed:1} Final Clinical Impression(s) / ED Diagnoses Final diagnoses:  None    Rx / DC Orders ED Discharge Orders     None

## 2024-02-14 NOTE — ED Provider Notes (Incomplete)
 [place] EMERGENCY DEPARTMENT AT [facility] Provider Note   CSN: [id number] Arrival date & time: 02/13/24  1431     History {Add pertinent medical, surgical, social history, OB history to HPI:1} Chief Complaint  Patient presents with  . Anxiety    Veronica [name] is a 33 y.o. female.  The history is provided by the patient and medical records. No language interpreter was used.  Anxiety This is a recurrent problem. The current episode started more than 2 days ago. The problem occurs daily. The problem has not changed since onset.Associated symptoms include chest pain. Pertinent negatives include no abdominal pain, no headaches and no shortness of breath. Nothing aggravates the symptoms. Nothing relieves the symptoms. She has tried nothing for the symptoms. The treatment provided no relief.       Home Medications Prior to Admission medications   Medication Sig Start Date End Date Taking? Authorizing Provider  albuterol (VENTOLIN HFA) 108 (90 Base) MCG/ACT inhaler INHALE 2 PUFFS BY MOUTH EVERY 4 HOURS AS NEEDED FOR WHEEZING OR SHORTNESS OF BREATH (COUGH). 11/13/23   Carney Living, MD  Blood Pressure KIT Please use to check blood pressure as needed. 07/15/23   Westley Chandler, MD  escitalopram (LEXAPRO) 20 MG tablet Take 1 tablet (20 mg total) by mouth daily. 02/04/24   Ivery Quale, MD  etonogestrel (NEXPLANON) 68 MG IMPL implant 1 each by Subdermal route once.    [provider]  ferrous sulfate (FEROSUL) 325 (65 FE) MG tablet TAKE 1 TABLET BY MOUTH EVERY OTHER DAY 01/18/24   Bess Kinds, MD  fluticasone (FLOVENT HFA) 110 MCG/ACT inhaler Inhale 2 puffs into the lungs 2 (two) times daily. 11/13/23   Carney Living, MD  hydroxypropyl methylcellulose / hypromellose (ISOPTO TEARS / GONIOVISC) 2.5 % ophthalmic solution Place 1 drop into both eyes 4 (four) times daily as needed for dry eyes. 10/24/23   Raspet, Noberto Retort, [name]  hydrOXYzine (ATARAX) 25 MG tablet  Take 1 tablet (25 mg total) by mouth 3 (three) times daily as needed for anxiety. 11/13/23   Carney Living, MD  lidocaine (XYLOCAINE) 2 % solution Use as directed 15 mLs in the mouth or throat as needed for mouth pain. 12/27/23   Wynonia Lawman A, NP  loratadine (CLARITIN) 10 MG tablet Take 1 tablet (10 mg total) by mouth daily. 05/22/23   Cora Collum, DO  pantoprazole (PROTONIX) 40 MG tablet Take 1 tablet (40 mg total) by mouth daily. 02/04/24   Ivery Quale, MD  polyethylene glycol (MIRALAX / GLYCOLAX) 17 g packet Take 17 g by mouth 2 (two) times daily. 05/15/23   Valetta Close, MD  senna (SENOKOT) 8.6 MG TABS tablet Take 1 tablet (8.6 mg total) by mouth 2 (two) times daily. 05/15/23   Valetta Close, MD  trimethoprim-polymyxin b Joaquim Lai) ophthalmic solution Place 1 drop into the left eye every 6 (six) hours. 10/24/23   Raspet, Noberto Retort, [name]      Allergies    Morphine, Ibuprofen, and Morphine and codeine    Review of Systems   Review of Systems  Constitutional:  Negative for chills, fatigue and fever.  HENT:  Negative for congestion.   Respiratory:  Negative for cough, chest tightness, shortness of breath and wheezing.   Cardiovascular:  Positive for chest pain. Negative for palpitations.  Gastrointestinal:  Negative for abdominal pain, constipation, diarrhea, nausea and vomiting.  Genitourinary:  Negative for dysuria and flank pain.  Musculoskeletal:  Negative  for back pain, neck pain and neck stiffness.  Skin:  Negative for rash and wound.  Neurological:  Negative for dizziness, weakness, light-headedness and headaches.  Psychiatric/Behavioral:  Negative for agitation and confusion. The patient is nervous/anxious.   All other systems reviewed and are negative.   Physical Exam Updated Vital Signs BP 129/78 (BP Location: Left Arm)   Pulse 85   Temp 99 F (37.2 C) (Oral)   Resp 18   Ht 5\' 2"  (1.575 m)   Wt 111.6 kg   SpO2 100%   BMI 44.99 kg/m  Physical  Exam Vitals and nursing note reviewed.  Constitutional:      General: She is not in acute distress.    Appearance: She is well-developed. She is not ill-appearing, toxic-appearing or diaphoretic.  HENT:     Head: Normocephalic and atraumatic.     Nose: No congestion or rhinorrhea.     Mouth/Throat:     Mouth: Mucous membranes are moist.  Eyes:     Extraocular Movements: Extraocular movements intact.     Conjunctiva/sclera: Conjunctivae normal.     Pupils: Pupils are equal, round, and reactive to light.  Cardiovascular:     Rate and Rhythm: Normal rate and regular rhythm.     Heart sounds: No murmur heard. Pulmonary:     Effort: Pulmonary effort is normal. No respiratory distress.     Breath sounds: Normal breath sounds.  Abdominal:     Palpations: Abdomen is soft.     Tenderness: There is no abdominal tenderness. There is no right CVA tenderness or left CVA tenderness.  Musculoskeletal:        General: No swelling or tenderness.     Cervical back: Neck supple.     Right lower leg: No edema.     Left lower leg: No edema.  Skin:    General: Skin is warm and dry.     Capillary Refill: Capillary refill takes less than 2 seconds.     Findings: No erythema.  Neurological:     General: No focal deficit present.     Mental Status: She is alert.     Sensory: No sensory deficit.     Motor: No weakness.  Psychiatric:        Mood and Affect: Mood is anxious.    ED Results / Procedures / Treatments   Labs (all labs ordered are listed, but only abnormal results are displayed) Labs Reviewed  COMPREHENSIVE METABOLIC PANEL - Abnormal; Notable for the following components:      Result Value   Creatinine, Ser 1.06 (*)    Total Protein 8.7 (*)    All other components within normal limits  CBC WITH DIFFERENTIAL/PLATELET  TROPONIN I (HIGH SENSITIVITY)    EKG EKG Interpretation Date/Time:  Saturday February 13 2024 15:36:58 EST Ventricular Rate:  81 PR Interval:  188 QRS  Duration:  88 QT Interval:  368 QTC Calculation: 427 R Axis:   23  Text Interpretation: Normal sinus rhythm Normal ECG When compared with ECG of 10-Feb-2024 02:18, PREVIOUS ECG IS PRESENT when compared to prior, similar appearance. No STEMI Confirmed by Theda Belfast ([id number]) on 02/13/2024 5:52:41 PM  Radiology No results found.  Procedures Procedures  {Document cardiac monitor, telemetry assessment procedure when appropriate:1}  Medications Ordered in ED Medications  escitalopram (LEXAPRO) tablet 15 mg (has no administration in time range)  LORazepam (ATIVAN) tablet 2 mg (2 mg Oral Given 02/13/24 1538)    ED Course/ Medical Decision Making/ A&P   {  Click here for ABCD2, HEART and other calculatorsREFRESH Note before signing :1}                              Medical Decision Making Amount and/or Complexity of Data Reviewed Labs: ordered.  Risk Prescription drug management.    Momina Hunton Reece is a 33 y.o. female with a past medical history significant for anxiety and panic attacks as well as GERD and obesity who presents with recurrent anxiety and panic attacks.  According to patient, she has had some anxiety for several years and has been on Lexapro.  They recently increased her Lexapro from 10-20.  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  She is comes emergency department before and had workup that was reassuring from a cardiac standpoint.  She is reporting chest tightness and chest discomfort with shortness of breath.  She had an episode again today and presents for reevaluation.  She is denying any      8:32 PM Patient reassessed and she is feeling better.  Psychiatry saw her and feel she needs a prescription for short course of Ativan to help as rescue medication and they will make changes to her hydroxyzine and Lexapro.  Patient agrees to this plan and follow-up  and will be discharged for outpatient follow-up.  {Document critical care time when appropriate:1} {Document review of labs and clinical decision tools ie heart score, Chads2Vasc2 etc:1}  {Document your independent review of radiology images, and any outside records:1} {Document your discussion with family members, caretakers, and with consultants:1} {Document social determinants of health affecting pt's care:1} {Document your decision making why or why not admission, treatments were needed:1} Final Clinical Impression(s) / ED Diagnoses Final diagnoses:  Anxiety attack    Rx / DC Orders ED Discharge Orders          Ordered    escitalopram (LEXAPRO) 5 MG tablet  Daily        02/13/24 1836    hydrOXYzine (ATARAX) 25 MG tablet  3 times daily PRN        02/13/24 1836    LORazepam (ATIVAN) 1 MG tablet  2 times daily PRN        02/13/24 2034           Clinical Impression: 1. Anxiety attack   2. GAD (generalized anxiety disorder)     Disposition: Discharge  Condition: Good  I have discussed the results, Dx and Tx plan with the pt(& family if present). He/she/they expressed understanding and agree(s) with the plan. Discharge instructions discussed at great length. Strict return precautions discussed and pt &/or family have verbalized understanding of the instructions. No further questions at time of discharge.    Discharge Medication List as of 02/13/2024  9:01 PM     START taking these medications   Details  LORazepam (ATIVAN) 1 MG tablet Take 1 tablet (1 mg total) by mouth 2 (two) times daily as needed for anxiety (panice attacks)., Starting Sat 02/13/2024, Normal        Follow Up: No follow-up provider specified.

## 2024-02-15 ENCOUNTER — Ambulatory Visit: Payer: Self-pay

## 2024-02-15 ENCOUNTER — Ambulatory Visit (HOSPITAL_COMMUNITY)
Admission: EM | Admit: 2024-02-15 | Discharge: 2024-02-15 | Disposition: A | Discharge status: Home / Self Care | Attending: Psychiatry | Admitting: Psychiatry

## 2024-02-15 DIAGNOSIS — F410 Panic disorder [episodic paroxysmal anxiety] without agoraphobia: Secondary | ICD-10-CM | POA: Insufficient documentation

## 2024-02-15 DIAGNOSIS — F411 Generalized anxiety disorder: Secondary | ICD-10-CM | POA: Insufficient documentation

## 2024-02-15 NOTE — Discharge Instructions (Signed)
 F/u with PCP.

## 2024-02-15 NOTE — Progress Notes (Signed)
   02/15/24 1444  [facility] Triage Screening (Walk-ins at [facility] only)  How Did You Hear About [place]? Self  What Is the Reason for Your Visit/Call Today? Pt presents to [facility] volunatirly unaccompanied. Pt states that she started experenicing panic attacks last wedesnday. Pt went to [facility] ED Wednesday and saturaday for panic attacks. Pt is experincing somatic symptoms through out her upper body as well as lack of focus. Pt is diagnosed with GAD, and taking hydroxizine, lexapro (upped 2 weeks ago, by [facility]) lorazapam prescribed by the ED on satruday. PT states that last Monday she watched a patient at work die from failed CPR which was the same thing that happened to her mother in 2017. Pt denies AHV, Abuse, Alcohol/Drug use  How Long Has This Been Causing You Problems? 1 wk - 1 month  Have You Recently Had Any Thoughts About Hurting Yourself? No  Are You Planning to Commit Suicide/Harm Yourself At This time? No  Have you Recently Had Thoughts About Hurting Someone Karolee Ohs? No  Are You Planning To Harm Someone At This Time? No  Physical Abuse Denies  Verbal Abuse Denies  Sexual Abuse Denies  Exploitation of patient/patient's resources Denies  Self-Neglect Denies  Are you currently experiencing any auditory, visual or other hallucinations? No  Have You Used Any Alcohol or Drugs in the Past 24 Hours? No  Clinician description of patient physical appearance/behavior: Pt is clear spoke, midly axious and cooperative  What Do You Feel Would Help You the Most Today? Treatment for Depression or other mood problem;Medication(s)  If access to [facility] was not available, would you have sought care in the Emergency Department? No  Determination of Need Urgent (48 hours)  Options For Referral Medication Management;Outpatient Therapy;Inpatient Hospitalization;Intensive Outpatient Therapy  Determination of Need filed? Yes

## 2024-02-15 NOTE — ED Provider Notes (Signed)
 Behavioral Health Urgent Care Medical Screening Exam  Patient Name: Veronica [name] MRN: [id number] Date of Evaluation: 02/16/24 Chief Complaint:  increase panic attack Diagnosis:  Final diagnoses:  Panic attack  Generalized anxiety disorder    History of Present illness: Veronica [name] is a 33 y.o. female. With a history of GAD and anxiety,  presented to [facility], voluntarily.  Per the patient she has been having increased panic attacks and she went to the ED over the weekend and they did all the tests and say that it was a panic attack.  Patient also stated that they gave her lorazepam.  Per the patient she has not seen a psychiatrist or therapist but her PCP does write her Lexapro and she was on 10 mg but they increased it to 20 mg.  According to patient and she does have heart palpitations and her stomach feel like it is racing and she already went to the ED a couple times and they told her it was a panic attack.  Writer discussed with patient that she need to reach out to her PCP to see if he can write the lorazepam however I will not be able to give her lorazepam tonight.  Writer did offer patient hydroxyzine patient stated that she already have hydroxyzine at home and it does not work.  Face-to-face evaluation of patient, patient is alert and oriented x 4, speech is clear, maintaining eye contact.  Patient very pleasant does not appear to be in any distress at this time.  Patient denies SI, HI, AVH or paranoia.  Denies alcohol use, denies illicit drug use, denies smoking.  According to patient she lives with her 2 kids.  Denies ever being hospitalized for any psychiatric problems.  Denies access to guns.  At this time patient does not seem to be influenced by internal or external stimuli does not pose a risk to herself or others.  Patient is advised to call 911 or return to the nearest ED should she experience any suicidal ideation homicidal thoughts or hallucination.  Patient verbalized  understanding.  At the time of this evaluation patient is both medically and psychologically cleared for discharge.  Recommend discharge for patient to follow up with her PCP  Flowsheet Row ED from 02/15/2024 in [facility] ED from 02/13/2024 in [facility] Emergency Department at [facility] ED from 02/10/2024 in [facility] Emergency Department at [facility]  C-SSRS RISK CATEGORY No Risk No Risk No Risk       Psychiatric Specialty Exam  Presentation  General Appearance:Casual  Eye Contact:Good  Speech:Clear and Coherent  Speech Volume:No data recorded Handedness:Right   Mood and Affect  Mood: Anxious  Affect: Appropriate   Thought Process  Thought Processes: Coherent  Descriptions of Associations:Intact  Orientation:Full (Time, Place and Person)  Thought Content:Logical  Diagnosis of Schizophrenia or Schizoaffective disorder in past: No data recorded  Hallucinations:None  Ideas of Reference:None  Suicidal Thoughts:No  Homicidal Thoughts:No   Sensorium  Memory: Immediate Good  Judgment: Fair  Insight: Fair   Executive Functions  Concentration: Good  Attention Span: Good  Recall: Good  Fund of Knowledge: Good  Language: Good   Psychomotor Activity  Psychomotor Activity: Normal   Assets  Assets: Desire for Improvement   Sleep  Sleep: Fair  Number of hours:  5   Physical Exam: Physical Exam HENT:     Head: Normocephalic.     Nose: Nose normal.  Eyes:  Pupils: Pupils are equal, round, and reactive to light.  Cardiovascular:     Rate and Rhythm: Normal rate.  Pulmonary:     Effort: Pulmonary effort is normal.  Musculoskeletal:        General: Normal range of motion.     Cervical back: Normal range of motion.  Neurological:     General: No focal deficit present.     Mental Status: She is alert.  Psychiatric:        Mood and Affect: Mood normal.    Review of  Systems  Constitutional: Negative.   HENT: Negative.    Eyes: Negative.   Respiratory: Negative.    Cardiovascular: Negative.   Gastrointestinal: Negative.   Genitourinary: Negative.   Musculoskeletal: Negative.   Skin: Negative.   Neurological: Negative.   Psychiatric/Behavioral:  The patient is nervous/anxious.    Blood pressure 135/80, pulse 94, temperature 99.5 F (37.5 C), temperature source Oral, resp. rate 18, SpO2 99%. There is no height or weight on file to calculate BMI.  Musculoskeletal: Strength & Muscle Tone: within normal limits Gait & Station: normal Patient leans: N/A   [facility] MSE Discharge Disposition for Follow up and Recommendations: Based on my evaluation the patient does not appear to have an emergency medical condition and can be discharged with resources and follow up care in outpatient services for Medication Management and Individual Therapy   Sindy Guadeloupe, NP 02/16/2024, 5:14 AM

## 2024-02-16 ENCOUNTER — Encounter: Payer: Self-pay | Admitting: Student

## 2024-02-16 ENCOUNTER — Ambulatory Visit (INDEPENDENT_AMBULATORY_CARE_PROVIDER_SITE_OTHER): Admitting: Student

## 2024-02-16 VITALS — BP 118/79 | HR 92 | Ht 62.0 in | Wt 234.4 lb

## 2024-02-16 DIAGNOSIS — F411 Generalized anxiety disorder: Secondary | ICD-10-CM | POA: Diagnosis present

## 2024-02-16 MED ORDER — BUSPIRONE HCL 5 MG PO TABS: 5.0000 mg | ORAL_TABLET | Freq: Three times a day (TID) | ORAL | 0 refills | Status: DC

## 2024-02-16 NOTE — Assessment & Plan Note (Signed)
 Panic attacks likely triggered by acute stress reaction. Lorazepam effective for acute attacks but not suitable long-term which was discussed with patient.  - Continue Lexapro 20 mg daily - Prescribe buspirone 5 mg three times daily, increase to 10 mg if needed. - Patient has some lorazepam as needed for acute panic attacks (11 pills, discussed not long term medication) - Continue hydroxyzine as needed - Refer to psychiatry for medication management. - Encouraged follow-up with therapist - social work referral for potential in person therapy - Provided a note for a week off work

## 2024-02-16 NOTE — Progress Notes (Signed)
    SUBJECTIVE:   CHIEF COMPLAINT / HPI: Anxiety/panic attacks  Presents with increased anxiety and panic attacks following a traumatic event at work.  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 Recently increased Lexapro to 20mg  daily and hydroxyzine 25mg  as needed for anxiety, and was recently prescribed 1mg  BID lorazepam for acute panic attacks (15 tablets by EDP)  Reports that the Lexapro doesn't seem to help Started the increased dose of lexapro on 2/28 and did not have any symptoms until the next Wednesday (after work event) The patient has also been experiencing high blood pressure readings, which she believes are related to the anxiety and panic attacks      02/16/2024   11:39 AM 02/04/2024    3:15 PM 01/08/2024    3:06 PM 10/01/2023    3:20 PM 05/28/2023   11:15 AM  Depression screen PHQ 2/9  Decreased Interest 1 0 0 0 0  Down, Depressed, Hopeless 1 1 0 0 0  PHQ - 2 Score 2 1 0 0 0  Altered sleeping 1 0 0 0 1  Tired, decreased energy 0 0 0 0 0  Change in appetite 1 0 0 0 1  Feeling bad or failure about yourself  0 0 0 0 0  Trouble concentrating 0 0 0 0 0  Moving slowly or fidgety/restless 0 0 0 0 0  Suicidal thoughts 0 0 0 0 0  PHQ-9 Score 4 1 0 0 2  Difficult doing work/chores Not difficult at all Not difficult at all   Not difficult at all     PERTINENT  PMH / PSH: asthma, GERD,   OBJECTIVE:   BP 118/79   Pulse 92   Ht 5\' 2"  (1.575 m)   Wt 234 lb 6.4 oz (106.3 kg)   SpO2 100%   BMI 42.87 kg/m   General: anxious appearing,awake, alert, responsive to questions Psych: Does not appear to be responding to any internal stimuli,  intermittently tearful Head: Normocephalic atraumatic Respiratory: chest rises symmetrically,  no increased work of breathing Extremities: Moves upper and lower extremities freely  ASSESSMENT/PLAN:   Assessment & Plan GAD (generalized anxiety disorder) Panic attacks likely triggered by acute stress reaction. Lorazepam effective for acute attacks but not suitable long-term which was discussed with patient.  - Continue Lexapro 20 mg daily - Prescribe buspirone 5 mg three times daily, increase to 10 mg if needed. - Patient has some lorazepam as needed for acute panic attacks (11 pills, discussed not long term medication) - Continue hydroxyzine as needed - Refer to psychiatry for medication management. - Encouraged follow-up with therapist - social work referral for potential in person therapy - Provided a note for a week off work   Levin Erp, MD [facility]

## 2024-02-16 NOTE — Patient Instructions (Signed)
 It was great to see you! Thank you for allowing me to participate in your care!   Our plans for today:  - I am going to send in buspar for you to try as well - I recommend reaching out to therapy to help in this acute phase - I can also place psychiatry referral for medication management  Take care and seek immediate care sooner if you develop any concerns.  Levin Erp, MD

## 2024-02-17 ENCOUNTER — Telehealth: Payer: Self-pay

## 2024-02-17 DIAGNOSIS — F411 Generalized anxiety disorder: Secondary | ICD-10-CM

## 2024-02-17 MED ORDER — BUSPIRONE HCL 5 MG PO TABS: 5.0000 mg | ORAL_TABLET | Freq: Three times a day (TID) | ORAL | 0 refills | Status: DC

## 2024-02-17 NOTE — Telephone Encounter (Signed)
 Patient calls nurse line regarding Buspirone 5 mg tablets.   She reports that she picked up prescription, however, spilt half of them on the ground.   She reports that she has 31 pills left.   She will need a new prescription to be sent to the pharmacy for remaining 59 pills.   Informed patient that I will forward message to provider for additional prescription, however, her insurance will likely not cover due to being too early to fill. Advised that she could contact her insurance company regarding incident and they may be able to provider her with an override.   Patient also asks to schedule follow up with PCP. Scheduled with PCP on 02/22/24.  Veronda Prude, RN

## 2024-02-19 ENCOUNTER — Telehealth: Payer: Self-pay

## 2024-02-19 NOTE — Telephone Encounter (Signed)
 Patient calls nurse line regarding concerns with possible medication interactions between Lexapro and Buspar.   She reports doing research on these medications and that they are high risk to be taken together. She is worried about having seizures or fainting.   She has stopped taking Lexapro and is only taking Buspar.   Informed patient that I would forward message to provider. She is still planning on keeping follow up visit on Monday with PCP.   Veronda Prude, RN

## 2024-02-22 ENCOUNTER — Encounter: Payer: Self-pay | Admitting: Student

## 2024-02-22 ENCOUNTER — Encounter (HOSPITAL_COMMUNITY): Payer: Self-pay | Admitting: *Deleted

## 2024-02-22 ENCOUNTER — Emergency Department (HOSPITAL_COMMUNITY)
Admission: EM | Admit: 2024-02-22 | Discharge: 2024-02-22 | Disposition: A | Discharge status: Home / Self Care | Attending: Emergency Medicine | Admitting: Emergency Medicine

## 2024-02-22 ENCOUNTER — Emergency Department (HOSPITAL_COMMUNITY)

## 2024-02-22 ENCOUNTER — Ambulatory Visit (INDEPENDENT_AMBULATORY_CARE_PROVIDER_SITE_OTHER): Admitting: Student

## 2024-02-22 ENCOUNTER — Other Ambulatory Visit: Payer: Self-pay

## 2024-02-22 DIAGNOSIS — F419 Anxiety disorder, unspecified: Secondary | ICD-10-CM

## 2024-02-22 DIAGNOSIS — F410 Panic disorder [episodic paroxysmal anxiety] without agoraphobia: Secondary | ICD-10-CM | POA: Diagnosis not present

## 2024-02-22 DIAGNOSIS — F411 Generalized anxiety disorder: Secondary | ICD-10-CM | POA: Diagnosis present

## 2024-02-22 DIAGNOSIS — R0789 Other chest pain: Secondary | ICD-10-CM | POA: Diagnosis not present

## 2024-02-22 DIAGNOSIS — R079 Chest pain, unspecified: Secondary | ICD-10-CM | POA: Diagnosis not present

## 2024-02-22 LAB — CBC
HCT: 38.5 % (ref 36.0–46.0)
Hemoglobin: 12.5 g/dL (ref 12.0–15.0)
MCH: 28.2 pg (ref 26.0–34.0)
MCHC: 32.5 g/dL (ref 30.0–36.0)
MCV: 86.9 fL (ref 80.0–100.0)
Platelets: 328 10*3/uL (ref 150–400)
RBC: 4.43 MIL/uL (ref 3.87–5.11)
RDW: 13.6 % (ref 11.5–15.5)
WBC: 8.8 10*3/uL (ref 4.0–10.5)
nRBC: 0 % (ref 0.0–0.2)

## 2024-02-22 LAB — COMPREHENSIVE METABOLIC PANEL
ALT: 15 U/L (ref 0–44)
AST: 11 U/L — ABNORMAL LOW (ref 15–41)
Albumin: 3.6 g/dL (ref 3.5–5.0)
Alkaline Phosphatase: 55 U/L (ref 38–126)
Anion gap: 9 (ref 5–15)
BUN: 13 mg/dL (ref 6–20)
CO2: 23 mmol/L (ref 22–32)
Calcium: 9.2 mg/dL (ref 8.9–10.3)
Chloride: 106 mmol/L (ref 98–111)
Creatinine, Ser: 1.15 mg/dL — ABNORMAL HIGH (ref 0.44–1.00)
GFR, Estimated: >60 mL/min (ref >60)
Glucose, Bld: 102 mg/dL — ABNORMAL HIGH (ref 70–99)
Potassium: 3.5 mmol/L (ref 3.5–5.1)
Sodium: 138 mmol/L (ref 135–145)
Total Bilirubin: 0.5 mg/dL (ref 0.0–1.2)
Total Protein: 7.1 g/dL (ref 6.5–8.1)

## 2024-02-22 LAB — HCG, SERUM, QUALITATIVE: Preg, Serum: NEGATIVE

## 2024-02-22 LAB — TROPONIN I (HIGH SENSITIVITY)
Troponin I (High Sensitivity): <2 ng/L (ref <18)
Troponin I (High Sensitivity): <2 ng/L (ref <18)

## 2024-02-22 LAB — RESP PANEL BY RT-PCR (RSV, FLU A&B, COVID)  RVPGX2
Influenza A by PCR: NEGATIVE
Influenza B by PCR: NEGATIVE
Resp Syncytial Virus by PCR: NEGATIVE
SARS Coronavirus 2 by RT PCR: NEGATIVE

## 2024-02-22 LAB — D-DIMER, QUANTITATIVE: D-Dimer, Quant: 0.39 ug{FEU}/mL (ref 0.00–0.50)

## 2024-02-22 MED ORDER — BUSPIRONE HCL 10 MG PO TABS: 10.0000 mg | ORAL_TABLET | Freq: Three times a day (TID) | ORAL | 3 refills | Status: DC

## 2024-02-22 MED ORDER — LORAZEPAM 1 MG PO TABS
1.0000 mg | ORAL_TABLET | Freq: Once | ORAL | Status: AC
  Administered 2024-02-22: 1 mg via ORAL
  Filled 2024-02-22: qty 1

## 2024-02-22 MED ORDER — ESCITALOPRAM OXALATE 20 MG PO TABS
20.0000 mg | ORAL_TABLET | Freq: Every day | ORAL | 4 refills | Status: DC
Start: 2024-02-22 — End: 2024-03-03

## 2024-02-22 NOTE — ED Triage Notes (Signed)
 The pt is c/o chest pain and through to her posterior chest all day sob  lmp 2 weeks ago

## 2024-02-22 NOTE — ED Provider Notes (Addendum)
 [place] EMERGENCY DEPARTMENT AT [facility] Provider Note   CSN: [id number] Arrival date & time: 02/22/24  0151     History  Chief Complaint  Patient presents with   Chest Pain    Veronica [name] is a 33 y.o. female.   Chest Pain    33 year old female with medical history significant for GERD, obesity, hiatal hernia, anemia, anxiety presenting to the emergency department with chest discomfort.  The patient states that she has been seen twice in the past 2 weeks for a similar complaint and both times has been diagnosed with a panic attack.  She states that last night she developed acute onset chest tightness that did not resolve with hydroxyzine.  She endorses tightness across her back.  Symptoms lasted for around 20 minutes and have since resolved.  She denies any history of DVT or PE, denies any pleuritic discomfort.  No fevers, chills or cough.  Home Medications Prior to Admission medications   Medication Sig Start Date End Date Taking? Authorizing Provider  albuterol (VENTOLIN HFA) 108 (90 Base) MCG/ACT inhaler INHALE 2 PUFFS BY MOUTH EVERY 4 HOURS AS NEEDED FOR WHEEZING OR SHORTNESS OF BREATH (COUGH). 11/13/23   Carney Living, MD  Blood Pressure KIT Please use to check blood pressure as needed. 07/15/23   Westley Chandler, MD  busPIRone (BUSPAR) 5 MG tablet Take 1 tablet (5 mg total) by mouth 3 (three) times daily. 02/17/24   Levin Erp, MD  escitalopram (LEXAPRO) 5 MG tablet Take 3 tablets (15 mg total) by mouth daily. 02/14/24   Lenox Ponds, NP  etonogestrel (NEXPLANON) 68 MG IMPL implant 1 each by Subdermal route once.    [provider]  ferrous sulfate (FEROSUL) 325 (65 FE) MG tablet TAKE 1 TABLET BY MOUTH EVERY OTHER DAY 01/18/24   Bess Kinds, MD  fluticasone (FLOVENT HFA) 110 MCG/ACT inhaler Inhale 2 puffs into the lungs 2 (two) times daily. 11/13/23   Carney Living, MD  hydroxypropyl methylcellulose / hypromellose (ISOPTO  TEARS / GONIOVISC) 2.5 % ophthalmic solution Place 1 drop into both eyes 4 (four) times daily as needed for dry eyes. 10/24/23   Raspet, Noberto Retort, [name]  hydrOXYzine (ATARAX) 25 MG tablet Take 2 tablets (50 mg total) by mouth 3 (three) times daily as needed for anxiety. 02/13/24   Lenox Ponds, NP  lidocaine (XYLOCAINE) 2 % solution Use as directed 15 mLs in the mouth or throat as needed for mouth pain. 12/27/23   Wynonia Lawman A, NP  loratadine (CLARITIN) 10 MG tablet Take 1 tablet (10 mg total) by mouth daily. 05/22/23   Cora Collum, DO  LORazepam (ATIVAN) 1 MG tablet Take 1 tablet (1 mg total) by mouth 2 (two) times daily as needed for anxiety (panice attacks). 02/13/24   Tegeler, Canary Brim, MD  pantoprazole (PROTONIX) 40 MG tablet Take 1 tablet (40 mg total) by mouth daily. 02/04/24   Ivery Quale, MD  polyethylene glycol (MIRALAX / GLYCOLAX) 17 g packet Take 17 g by mouth 2 (two) times daily. 05/15/23   Valetta Close, MD  senna (SENOKOT) 8.6 MG TABS tablet Take 1 tablet (8.6 mg total) by mouth 2 (two) times daily. 05/15/23   Valetta Close, MD  trimethoprim-polymyxin b Joaquim Lai) ophthalmic solution Place 1 drop into the left eye every 6 (six) hours. 10/24/23   Raspet, Noberto Retort, [name]      Allergies    Morphine, Ibuprofen, and Morphine and codeine  Review of Systems   Review of Systems  Cardiovascular:  Positive for chest pain.  All other systems reviewed and are negative.   Physical Exam Updated Vital Signs BP 129/77 (BP Location: Right Arm)   Pulse 93   Temp 98.8 F (37.1 C)   Resp 18   Ht 5\' 2"  (1.575 m)   Wt 106.3 kg   LMP 02/08/2024 (Exact Date)   SpO2 100%   BMI 42.86 kg/m  Physical Exam Vitals and nursing note reviewed.  Constitutional:      General: She is not in acute distress.    Appearance: She is well-developed.  HENT:     Head: Normocephalic and atraumatic.  Eyes:     Conjunctiva/sclera: Conjunctivae normal.  Cardiovascular:     Rate and Rhythm:  Normal rate and regular rhythm.  Pulmonary:     Effort: Pulmonary effort is normal. No respiratory distress.     Breath sounds: Normal breath sounds.  Chest:     Chest wall: No tenderness.  Abdominal:     Palpations: Abdomen is soft.     Tenderness: There is no abdominal tenderness.  Musculoskeletal:        General: No swelling.     Cervical back: Neck supple.  Skin:    General: Skin is warm and dry.     Capillary Refill: Capillary refill takes less than 2 seconds.  Neurological:     Mental Status: She is alert.  Psychiatric:        Mood and Affect: Mood normal.     ED Results / Procedures / Treatments   Labs (all labs ordered are listed, but only abnormal results are displayed) Labs Reviewed  COMPREHENSIVE METABOLIC PANEL - Abnormal; Notable for the following components:      Result Value   Glucose, Bld 102 (*)    Creatinine, Ser 1.15 (*)    AST 11 (*)    All other components within normal limits  RESP PANEL BY RT-PCR (RSV, FLU A&B, COVID)  RVPGX2  CBC  HCG, SERUM, QUALITATIVE  D-DIMER, QUANTITATIVE  TROPONIN I (HIGH SENSITIVITY)  TROPONIN I (HIGH SENSITIVITY)    EKG None  Radiology No results found.  Procedures Procedures    Medications Ordered in ED Medications  LORazepam (ATIVAN) tablet 1 mg (1 mg Oral Given 02/22/24 0800)    ED Course/ Medical Decision Making/ A&P Clinical Course as of 02/22/24 0905  Mon Feb 22, 2024  0829 D-Dimer, Quant: 0.39 [JL]    Clinical Course User Index [JL] Ernie Avena, MD             HEART Score: 1                    Medical Decision Making Amount and/or Complexity of Data Reviewed Labs: ordered. Decision-making details documented in ED Course. Radiology: ordered.  Risk Prescription drug management.    33 year old female with medical history significant for GERD, obesity, hiatal hernia, anemia, anxiety presenting to the emergency department with chest discomfort.  The patient states that she has been seen  twice in the past 2 weeks for a similar complaint and both times has been diagnosed with a panic attack.  She states that last night she developed acute onset chest tightness that did not resolve with hydroxyzine.  She endorses tightness across her back.  Symptoms lasted for around 20 minutes and have since resolved.  She denies any history of DVT or PE, denies any pleuritic discomfort.  No fevers, chills  or cough.  Medical Decision Making: Veronica [name] is a 33 y.o. female who presented to the ED today with chest pain, detailed above.  Based on patient's comorbidities, patient has a heart score of 1.     Complete initial physical exam performed, notably the patient was CTAB, in no distress.   Reviewed and confirmed nursing documentation for past medical history, family history, social history.    Initial Assessment: With the patient's presentation of left-sided chest pain, most likely diagnosis is panic attack/anxiety vs musculoskeletal chest pain versus GERD, although ACS remains on the differential. Other diagnoses were considered including (but not limited to) pulmonary embolism, community-acquired pneumonia, aortic dissection, pneumothorax, underlying bony abnormality, anemia. These are considered less likely due to history of present illness and physical exam findings.    In particular, concerning pulmonary embolism: Patient is PERC positive due to initial tachycardia and the they deny malignancy, recent surgery, history of DVT, or calf tenderness leading to a low risk Wells score. Aortic Dissection also considered but seems less likely based on the location, quality, onset, and severity of symptoms in this case.   Initial Plan: Evaluate for ACS with delta troponin and EKG evaluated as below  Evaluate for dissection, bony abnormality, or pneumonia with chest x-ray and screening laboratory evaluation including CBC, BMP  Further evaluation for pulmonary embolism indicated at this time based on  patient's PERC and Wells score.  Further evaluation for Thoracic Aortic Dissection not indicated at this time based on patient's clinical history and PE findings.   Initial Study Results: EKG was reviewed independently. Rate, rhythm, axis, intervals all examined and without medically relevant abnormality. ST segments without concerns for elevations.    Laboratory  Delta  troponin demonstrated normal values.  Dimer negative.   CBC and BMP without obvious metabolic or inflammatory abnormalities requiring further evaluation   Radiology  DG Chest 2 View Result Date: 02/10/2024 CLINICAL DATA:  Chest pain EXAM: CHEST - 2 VIEW COMPARISON:  05/12/2023 FINDINGS: The heart size and mediastinal contours are within normal limits. Both lungs are clear. The visualized skeletal structures are unremarkable. IMPRESSION: No active cardiopulmonary disease. Electronically Signed   By: Alcide Clever M.D.   On: 02/10/2024 03:03    Final Assessment and Plan: Patient feeling symptomatically improved after Ativan.  Chest x-ray on my review revealed no evidence of pneumothorax or pneumonia, no other acute pulmonary or cardiac abnormality.  Cardiac troponins x 2 were negative and the patient's D-dimer was negative.  Patient symptoms are very consistent with panic attack, have since resolved.  Advised outpatient PCP follow-up regarding the patient's anxiety and panic disorder symptoms.  Currently taking hydroxyzine, Buspar, Lexapro. Advised to follow-up with her primary care to discuss further long term medication management.   Final Clinical Impression(s) / ED Diagnoses Final diagnoses:  Panic attack  Anxiety    Rx / DC Orders ED Discharge Orders     None         Ernie Avena, MD 02/22/24 9147    Ernie Avena, MD 02/22/24 [id number]

## 2024-02-22 NOTE — Assessment & Plan Note (Addendum)
 Patient comes in for follow-up of her anxiety.  Patient reports that she recently went to the ED for panic attacks.  Patient is been to the ED 3-4 times in the last month for panic attacks.  Patient reports panic attacks have been an issue since seen patient coded at work, similar to how her mom passed away.  Patient reports that she stopped taking her Lexapro, but was taking BuSpar, because she read about interaction causing seizures.  I have encouraged patient to keep taking both medications.  Will follow-up in 2 to 4 weeks.  Patient also has short acting benzo for rescue symptoms. - Lexapro 20 mg daily - BuSpar 10 mg 3 times daily prn for anxiety - Hydroxyzine 50 mg 3 times daily as needed, for breakthrough anxiety and sleep - Follow-up 2 to 4 weeks - Half tab lorazepam sparingly for persistent symptoms

## 2024-02-22 NOTE — Patient Instructions (Addendum)
 It was great to see you! Thank you for allowing me to participate in your care!  I recommend that you always bring your medications to each appointment as this makes it easy to ensure we are on the correct medications and helps [place] not miss when refills are needed.  Our plans for today:  - Anxiety  Restart Lexapro 20 mg daily Continue Buspar 10 mg daily (take one pill daily, and take 1-2 more pills as needed for anxiety Take Hydroxyzine 50 mg for breakthrough anxiety (allow 40-60 min to work) Lorazepam 1/2 tab (0.5 mg) for persistent symptoms if buspar and hydroxyzine don't work.  Find Therapist from list below  Follow up 4 weeks    Take care and seek immediate care sooner if you develop any concerns.   Dr. Bess Kinds, MD [facility]    Therapy and Counseling Resources Most providers on this list will take Medicaid. Patients with commercial insurance or Medicare should contact their insurance company to get a list of in network providers.  [facility] (takes children) Location 1: [address] [place], [place] [address] Location 2: [address] [place], [place] [address] [phone]   [facility] (spanish speaking therapist available)(habla espanol)(take medicare and medicaid)  [address], [place], [place] [address], [email or web address] [phone]  [facility]Psychiatry and Counseling [address] [facility] [address] [place], [place] [address] [phone]  [facility]   [address], [address] [place], [place] [address]      [phone]  [facility] (spanish available) [address]  [place], [place] [address] [phone]  [facility] (take [facility] and medicare) [address], [address]  [place], [place] [address]       [phone]     [facility] (virtual only) [phone]  [facility] [address] [place], [place]  [phone]  [facility]:  [address] [place] [phone]  [facility]:  [address] [place] [phone]  [facility] (under & uninsured) [address]   [place] [phone]    [email or web address]    [facility] [address]  [place]    [phone]  [facility] -[address]     Phone:  [phone]     [facility]-  [address]  [phone]   [facility] [address]      [place], [place] [phone]  [facility]: [address] [place], [place], [place]  [phone]   [facility] (Spanish therapist) [email or web address]  [address] [place]  [address]   [place] [address]    [phone]    [facility]   [address],  [place], [place]  [phone]   [facility]  [address] [place]  [phone]  [facility]  [address] [place], [place]        [phone]  Open Access/Walk In Clinic under & uninsured  [facility]  [address] [place], [place] [phone] Crisis [phone]  [facility] [address],  (Spanish)   [address], [place]: [phone]) 8:30 - 12; 1 - 2:30  [facility] [employer],  [address] [place], [place]    [phone]):8:30 - 12; 2 - 3PM  [facility] [employer],  [address],  [place]; [phone]):   Mon - Fri 8 AM - 5 PM  [facility] [address] [place]  MWF 12:30 to 3:00 or call to schedule an appointment  [phone]  Specific Provider options Psychology Today  [email or web address] click on find a therapist  enter your zip code left side and select or tailor a therapist for your specific need.   [facility] Provider  Directory [email or web address]  (Medicaid)   Follow all drop down to find a provider  Social Support program [facility] [place] [phone] or [employer] [address], [place], [place] Recovery support and educational   24- Hour Availability:   [facility]  [address] [place], [place] [phone] Crisis [phone]  [facility] [phone]  [place] [facility]  [phone]   [facility]  [phone] (after hours)  Therapeutic Alternative/Mobile Crisis   [phone]  [place] National Suicide Hotline  [phone] Len Childs)  Call 911 or go to emergency room  [facility]  [phone]);  [place] and [employer]  [phone]); [place], [place], [place], [place], [place], [place], [place]

## 2024-02-22 NOTE — Progress Notes (Cosign Needed Addendum)
  SUBJECTIVE:   CHIEF COMPLAINT / HPI:   Anxiety -ED visit 3/5, 3/8, 3/10 > started on Lexapro, hydroxizine, lorazepam -Clinic visit 3/12 > started on buspar 5 mg & referral to psych  Today: Pt went to ED for panic attack, notes she has not been on lexapro for last few days and only taking buspar. Is open to seeing therapist. Notes history of high anxiety, but it's been worse/causing panic attacks because she saw a patient coded at work, like her mom was.   PERTINENT  PMH / PSH:    OBJECTIVE:  BP 124/81   Pulse (!) 101   Ht 5\' 2"  (1.575 m)   Wt 238 lb (108 kg)   LMP 02/08/2024 (Exact Date)   SpO2 100%   BMI 43.53 kg/m  Physical Exam Constitutional:      General: She is not in acute distress.    Appearance: Normal appearance. She is not ill-appearing.  Cardiovascular:     Rate and Rhythm: Normal rate and regular rhythm.     Pulses: Normal pulses.     Heart sounds: No murmur heard.    No friction rub. No gallop.  Pulmonary:     Effort: Pulmonary effort is normal. No respiratory distress.     Breath sounds: Normal breath sounds. No stridor. No wheezing, rhonchi or rales.  Neurological:     Mental Status: She is alert.      ASSESSMENT/PLAN:   Assessment & Plan GAD (generalized anxiety disorder) Patient comes in for follow-up of her anxiety.  Patient reports that she recently went to the ED for panic attacks.  Patient is been to the ED 3-4 times in the last month for panic attacks.  Patient reports panic attacks have been an issue since seen patient coded at work, similar to how her mom passed away.  Patient reports that she stopped taking her Lexapro, but was taking BuSpar, because she read about interaction causing seizures.  I have encouraged patient to keep taking both medications.  Will follow-up in 2 to 4 weeks.  Patient also has short acting benzo for rescue symptoms. - Lexapro 20 mg daily - BuSpar 10 mg 3 times daily prn for anxiety - Hydroxyzine 50 mg 3 times daily  as needed, for breakthrough anxiety and sleep - Follow-up 2 to 4 weeks - Half tab lorazepam sparingly for persistent symptoms No follow-ups on file. Bess Kinds, MD 02/22/2024, 3:03 PM PGY-3, [facility]

## 2024-02-22 NOTE — Discharge Instructions (Addendum)
 Your cardiac workup was overall reassuring with normal cardiac enzymes and a blood test that ruled out a blood clot in your lungs.  Your chest x-ray was clear.  Your EKG was reassuring.  Symptoms are consistent with likely panic attack.  Follow-up with a primary care provider for longer-term medical management of anxiety.

## 2024-02-22 NOTE — ED Notes (Signed)
 The pt was here for the same time twice in the past 2 weeks before tonight  the last time was 2 days ago  the two past times  she was diagnosed with anxiety and panic attacks  no chest xrays ordered  this time  she already had 2 in the past 2 weeks  she is not hyperventilating

## 2024-02-25 ENCOUNTER — Other Ambulatory Visit: Payer: Self-pay

## 2024-02-25 NOTE — Telephone Encounter (Signed)
 Patient calls nurse line requesting a refill on Lorazepam.   She reports she discussed this with PCP at recent visit and was told to call for a refill when time.   Will forward to PCP.

## 2024-02-25 NOTE — Telephone Encounter (Signed)
 Called to discuss ativan refill with patient. Patient notes that this is the only thing that helps her sleep. I told patient this is not a good medicine to manage anxiety, and not supposed to be used for sleep. This medicine is addictive and can become less helpful as time goes one. We will try to get by without this medication.   Patient notes that she is having problems sleeping, and this provider discussed that she needs to continue the lexapro and buspar. Once they get to treatment doses in body, overall anxiety should come down.   Patient noted that she was not taking the buspar, because she was worried that it might cause shaking for her. I told her not to worry about that (tremors related to serotonin syndrome), and that these two medications were safe together, and exactly what I wanted her on.   Patient in agreement with plan to take lexapro daily, Buspar at least once a day, and every 8 hours if anxiety continues, with hydroxyzine for break through anxiety. Will hold off on lorazepam for now.

## 2024-02-26 ENCOUNTER — Encounter (HOSPITAL_COMMUNITY): Payer: Self-pay

## 2024-02-26 ENCOUNTER — Other Ambulatory Visit: Payer: Self-pay

## 2024-02-26 ENCOUNTER — Emergency Department (HOSPITAL_COMMUNITY)

## 2024-02-26 ENCOUNTER — Emergency Department (HOSPITAL_COMMUNITY)
Admission: EM | Admit: 2024-02-26 | Discharge: 2024-02-27 | Disposition: A | Discharge status: Home / Self Care | Attending: Emergency Medicine | Admitting: Emergency Medicine

## 2024-02-26 DIAGNOSIS — R0789 Other chest pain: Secondary | ICD-10-CM | POA: Diagnosis not present

## 2024-02-26 DIAGNOSIS — J45909 Unspecified asthma, uncomplicated: Secondary | ICD-10-CM | POA: Diagnosis not present

## 2024-02-26 DIAGNOSIS — F410 Panic disorder [episodic paroxysmal anxiety] without agoraphobia: Secondary | ICD-10-CM | POA: Diagnosis not present

## 2024-02-26 DIAGNOSIS — Z7951 Long term (current) use of inhaled steroids: Secondary | ICD-10-CM | POA: Diagnosis not present

## 2024-02-26 DIAGNOSIS — F411 Generalized anxiety disorder: Secondary | ICD-10-CM | POA: Diagnosis not present

## 2024-02-26 DIAGNOSIS — R079 Chest pain, unspecified: Secondary | ICD-10-CM | POA: Diagnosis not present

## 2024-02-26 LAB — CBC
HCT: 38.9 % (ref 36.0–46.0)
Hemoglobin: 12.8 g/dL (ref 12.0–15.0)
MCH: 28.2 pg (ref 26.0–34.0)
MCHC: 32.9 g/dL (ref 30.0–36.0)
MCV: 85.7 fL (ref 80.0–100.0)
Platelets: 331 10*3/uL (ref 150–400)
RBC: 4.54 MIL/uL (ref 3.87–5.11)
RDW: 13.4 % (ref 11.5–15.5)
WBC: 9.2 10*3/uL (ref 4.0–10.5)
nRBC: 0 % (ref 0.0–0.2)

## 2024-02-26 LAB — HCG, SERUM, QUALITATIVE: Preg, Serum: NEGATIVE

## 2024-02-26 LAB — BASIC METABOLIC PANEL
Anion gap: 10 (ref 5–15)
BUN: 7 mg/dL (ref 6–20)
CO2: 22 mmol/L (ref 22–32)
Calcium: 8.9 mg/dL (ref 8.9–10.3)
Chloride: 103 mmol/L (ref 98–111)
Creatinine, Ser: 0.97 mg/dL (ref 0.44–1.00)
GFR, Estimated: >60 mL/min (ref >60)
Glucose, Bld: 105 mg/dL — ABNORMAL HIGH (ref 70–99)
Potassium: 3.3 mmol/L — ABNORMAL LOW (ref 3.5–5.1)
Sodium: 135 mmol/L (ref 135–145)

## 2024-02-26 LAB — TROPONIN I (HIGH SENSITIVITY): Troponin I (High Sensitivity): <2 ng/L (ref <18)

## 2024-02-26 NOTE — ED Triage Notes (Signed)
 Patient c/o a substernal burning sensation for about 45 mins PTA. Patient has hx of anxiety of panic attacks and GERD, reports this feels like her first panic attack. Patient reports that she feels jittery on the inside too.

## 2024-02-27 LAB — TROPONIN I (HIGH SENSITIVITY): Troponin I (High Sensitivity): 2 ng/L (ref <18)

## 2024-02-27 MED ORDER — POTASSIUM CHLORIDE CRYS ER 20 MEQ PO TBCR
40.0000 meq | EXTENDED_RELEASE_TABLET | Freq: Once | ORAL | Status: AC
  Administered 2024-02-27: 40 meq via ORAL
  Filled 2024-02-27: qty 2

## 2024-02-27 MED ORDER — LORAZEPAM 1 MG PO TABS
1.0000 mg | ORAL_TABLET | Freq: Once | ORAL | Status: AC
  Administered 2024-02-27: 1 mg via ORAL
  Filled 2024-02-27: qty 1

## 2024-02-27 NOTE — ED Provider Notes (Signed)
 [place] EMERG[facility] DE[facility]MENT AT [facility] Provider Note   CSN: [id number] Arrival date & time: 02/26/24  2151     History  Chief Complaint  Patient presents with   Chest Pain    Veronica [name] is a 33 y.o. female.  33 year old female presents today for concern of panic attack that occurred last night.  During the panic attack she had a burning sensation through her entire body as well as chest pain.  She states she has had now 3 episodes in the past week.  2 of those episodes were accompanied with chest pain.  She does not have prior history of hypertension, or CAD. He does state that her mom passed away from heart attack at age 32.  She states that she recently had her Lexapro increased within the past week to 20 mg and buspirone initiated as well.  She recently started the buspirone yesterday.  She states that she takes Atarax which was increased to 50 occasionally but that has not seemed to help.  The history is provided by the patient. No language interpreter was used.       Home Medications Prior to Admission medications   Medication Sig Start Date End Date Taking? Authorizing Provider  albuterol (VENTOLIN HFA) 108 (90 Base) MCG/ACT inhaler INHALE 2 PUFFS BY MOUTH EVERY 4 HOURS AS NEEDED FOR WHEEZING OR SHORTNESS OF BREATH (COUGH). 11/13/23   Carney Living, MD  Blood Pressure KIT Please use to check blood pressure as needed. 07/15/23   Westley Chandler, MD  busPIRone (BUSPAR) 10 MG tablet Take 1 tablet (10 mg total) by mouth 3 (three) times daily. 02/22/24   Bess Kinds, MD  escitalopram (LEXAPRO) 20 MG tablet Take 1 tablet (20 mg total) by mouth daily. 02/22/24   Bess Kinds, MD  etonogestrel (NEXPLANON) 68 MG IMPL implant 1 each by Subdermal route once.    [provider]  ferrous sulfate (FEROSUL) 325 (65 FE) MG tablet TAKE 1 TABLET BY MOUTH EVERY OTHER DAY 01/18/24   Bess Kinds, MD  fluticasone (FLOVENT HFA) 110 MCG/ACT inhaler Inhale  2 puffs into the lungs 2 (two) times daily. 11/13/23   Carney Living, MD  hydroxypropyl methylcellulose / hypromellose (ISOPTO TEARS / GONIOVISC) 2.5 % ophthalmic solution Place 1 drop into both eyes 4 (four) times daily as needed for dry eyes. 10/24/23   Raspet, Noberto Retort, [name]  hydrOXYzine (ATARAX) 25 MG tablet Take 2 tablets (50 mg total) by mouth 3 (three) times daily as needed for anxiety. 02/13/24   Lenox Ponds, NP  lidocaine (XYLOCAINE) 2 % solution Use as directed 15 mLs in the mouth or throat as needed for mouth pain. 12/27/23   Wynonia Lawman A, NP  loratadine (CLARITIN) 10 MG tablet Take 1 tablet (10 mg total) by mouth daily. 05/22/23   Cora Collum, DO  LORazepam (ATIVAN) 1 MG tablet Take 1 tablet (1 mg total) by mouth 2 (two) times daily as needed for anxiety (panice attacks). 02/13/24   Tegeler, Canary Brim, MD  pantoprazole (PROTONIX) 40 MG tablet Take 1 tablet (40 mg total) by mouth daily. 02/04/24   Ivery Quale, MD  polyethylene glycol (MIRALAX / GLYCOLAX) 17 g packet Take 17 g by mouth 2 (two) times daily. 05/15/23   Valetta Close, MD  senna (SENOKOT) 8.6 MG TABS tablet Take 1 tablet (8.6 mg total) by mouth 2 (two) times daily. 05/15/23   Valetta Close, MD  trimethoprim-polymyxin b Joaquim Lai) ophthalmic  solution Place 1 drop into the left eye every 6 (six) hours. 10/24/23   Raspet, Noberto Retort, [name]      Allergies    Morphine, Ibuprofen, and Morphine and codeine    Review of Systems   Review of Systems  Constitutional:  Negative for chills and fever.  Respiratory:  Negative for shortness of breath.   Cardiovascular:  Positive for chest pain (Now resolved).  Neurological:  Negative for light-headedness.  All other systems reviewed and are negative.   Physical Exam Updated Vital Signs BP 126/77 (BP Location: Right Arm)   Pulse 71   Temp 98.1 F (36.7 C) (Oral)   Resp 20   Ht 5\' 2"  (1.575 m)   Wt 108 kg   LMP 02/08/2024 (Exact Date)   SpO2 98%   BMI 43.53  kg/m  Physical Exam Vitals and nursing note reviewed.  Constitutional:      General: She is not in acute distress.    Appearance: Normal appearance. She is not ill-appearing.  HENT:     Head: Normocephalic and atraumatic.     Nose: Nose normal.  Eyes:     General: No scleral icterus.    Extraocular Movements: Extraocular movements intact.     Conjunctiva/sclera: Conjunctivae normal.  Cardiovascular:     Rate and Rhythm: Normal rate and regular rhythm.     Pulses: Normal pulses.     Heart sounds: Normal heart sounds.  Pulmonary:     Effort: Pulmonary effort is normal. No respiratory distress.     Breath sounds: Normal breath sounds. No wheezing or rales.  Musculoskeletal:        General: Normal range of motion.     Cervical back: Normal range of motion.  Skin:    General: Skin is warm and dry.  Neurological:     General: No focal deficit present.     Mental Status: She is alert. Mental status is at baseline.     ED Results / Procedures / Treatments   Labs (all labs ordered are listed, but only abnormal results are displayed) Labs Reviewed  BASIC METABOLIC PANEL - Abnormal; Notable for the following components:      Result Value   Potassium 3.3 (*)    Glucose, Bld 105 (*)    All other components within normal limits  CBC  HCG, SERUM, QUALITATIVE  TROPONIN I (HIGH SENSITIVITY)  TROPONIN I (HIGH SENSITIVITY)    EKG None  Radiology DG Chest 2 View Result Date: 02/26/2024 CLINICAL DATA:  chest pain EXAM: CHEST - 2 VIEW COMPARISON:  Chest x-ray 02/22/2024 FINDINGS: The heart and mediastinal contours are within normal limits. No focal consolidation. No pulmonary edema. No pleural effusion. No pneumothorax. No acute osseous abnormality. IMPRESSION: No active cardiopulmonary disease. Electronically Signed   By: Tish Frederickson M.D.   On: 02/26/2024 23:36    Procedures Procedures    Medications Ordered in ED Medications - No data to display  ED Course/ Medical  Decision Making/ A&P                                 Medical Decision Making Amount and/or Complexity of Data Reviewed Labs: ordered. Radiology: ordered.  Risk Prescription drug management.   Medical Decision Making / ED Course   This patient presents to the ED for concern of panic attack, chest pain, this involves an extensive number of treatment options, and is a complaint that carries  with it a high risk of complications and morbidity.  The differential diagnosis includes panic attack, anxiety, ACS, MSK etiology, pneumonia  MDM: 33 year old female presents today for concern of panic attack that occurred last night.  This was associated with some chest pain.  Chest pain has since resolved.  She states she still has had intermittent burning throughout her body since last night.  Consistent with a previous episode of panic attack in the past week.  She also has premature family history of CAD.  Feel like she may benefit from cardiology follow-up.  Otherwise low heart score.  Troponin negative x 2.  CBC unremarkable, BMP with potassium of 3.3, glucose of 105.  Chest x-ray without acute cardiopulmonary process. EKG without acute ischemic change.  Low suspicion for ACS.  Likely chest pain and presentation symptoms or anxiety related.  Discussed close follow-up with PCP.  Resource information for behavioral health urgent care provided as well.  Cardiology referral given.  Patient voices understanding and is in agreement with plan.  Discharged in stable condition.  Lab Tests: -I ordered, reviewed, and interpreted labs.   The pertinent results include:   Labs Reviewed  BASIC METABOLIC PANEL - Abnormal; Notable for the following components:      Result Value   Potassium 3.3 (*)    Glucose, Bld 105 (*)    All other components within normal limits  CBC  HCG, SERUM, QUALITATIVE  TROPONIN I (HIGH SENSITIVITY)  TROPONIN I (HIGH SENSITIVITY)      EKG  EKG Interpretation Date/Time:     Ventricular Rate:    PR Interval:    QRS Duration:    QT Interval:    QTC Calculation:   R Axis:      Text Interpretation:           Imaging Studies ordered: I ordered imaging studies including cxr I independently visualized and interpreted imaging. I agree with the radiologist interpretation   Medicines ordered and prescription drug management: No orders of the defined types were placed in this encounter.   -I have reviewed the patients home medicines and have made adjustments as needed  Reevaluation: After the interventions noted above, I reevaluated the patient and found that they have :resolved  Co morbidities that complicate the patient evaluation  Past Medical History:  Diagnosis Date   Abdominal pain 01/06/2012   Acute tension headache 07/27/2019   Anemia    Ankle pain    Anxiety    Asthma    Birth control counseling 08/26/2011   BMI 40.0-44.9, adult (HCC) 02/27/2011   Dysuria 01/13/2013   Epigastric pain 09/13/2019   GERD (gastroesophageal reflux disease)    Heart murmur    Hiatal hernia    Obesity (BMI 30-39.9)    Pre-eclampsia    off med since 4/23   Secondary amenorrhea 03/25/2018   Trichomonas infection 06/17/2022      Dispostion: Discharged in stable condition.  Return precaution discussed.  Patient voices understanding and is in agreement with plan.   Final Clinical Impression(s) / ED Diagnoses Final diagnoses:  Atypical chest pain  Panic attack    Rx / DC Orders ED Discharge Orders          Ordered    Ambulatory referral to Cardiology       Comments: If you have not heard from the Cardiology office within the next 72 hours please call [phone].   02/27/24 [id number]  Marita Kansas, [name] 02/27/24 0981    Lonell Grandchild, MD 02/27/24 [phone]

## 2024-02-27 NOTE — Discharge Instructions (Signed)
 Your workup today is reassuring.  Have given you referral to cardiology given your family history for heart disease.  For any emergent symptoms please return to the emergency department.  Otherwise please follow-up with your primary care doctor.  I have also given you information for the behavioral health urgent care that she can follow-up with.

## 2024-03-01 ENCOUNTER — Ambulatory Visit: Admitting: Family Medicine

## 2024-03-01 ENCOUNTER — Encounter: Payer: Self-pay | Admitting: Family Medicine

## 2024-03-01 ENCOUNTER — Encounter: Payer: Self-pay | Admitting: Emergency Medicine

## 2024-03-01 VITALS — BP 122/74 | HR 78 | Ht 62.0 in | Wt 234.6 lb

## 2024-03-01 DIAGNOSIS — F410 Panic disorder [episodic paroxysmal anxiety] without agoraphobia: Secondary | ICD-10-CM | POA: Diagnosis present

## 2024-03-01 MED ORDER — TRAZODONE HCL 50 MG PO TABS: 50.0000 mg | ORAL_TABLET | Freq: Every evening | ORAL | 3 refills | Status: DC | PRN

## 2024-03-01 NOTE — Patient Instructions (Addendum)
 Good to see you today - Thank you for coming in  Things we discussed today:  1) Continue taking lexapro for your anxiety  2) Find a counselor (for therapy) and a psychiatrist (for medications). You can use PsychologyToday.com for both  3) Start taking Trazadone 50mg  1 hour before bed time to help with sleep  4) When you have panic attacks, you can use the 5-4-3-2-1 exercise: - Describe 5 things you can see, 4 things you can touch, 3 things you can hear, 2 things you can smell, and 1 thing you can taste.  - This exercise helps to ground you and control panic attacks when you have them - Remember that panic attacks usually last 10-30 minutes  5) Continue exercising to release your energy. It can help with sleep and anxiety.   6) Make an appointment with Dr. Raymondo Band for an "ambulatory blood pressure monitoring". This will help check your blood pressure.   Come back in 1 month to follow-up with Dr. Barbaraann Faster.

## 2024-03-01 NOTE — Progress Notes (Unsigned)
    SUBJECTIVE:   CHIEF COMPLAINT / HPI:   Veronica [name] is a 33yo F w/ hx of panic/anxiety that presents for follow-up of anxiety. - Had a traumatic experience at work where someone passed away from a heart attack.  Her mom also died in her 2s due to a heart attack.  This experience has triggered panic for her that she may also have a heart attack.  When she gets panic attacks, it feels like something is wrong with her heart. - Stopped taking Buspar due to chest pain - Got a therapist through Medicaid.  She wants a different one though because she would rather see someone in person.  Her current therapist is only online. - Denies SI  OBJECTIVE:   BP 122/74   Pulse 78   Ht 5\' 2"  (1.575 m)   Wt 234 lb 9.6 oz (106.4 kg)   LMP 02/08/2024 (Exact Date)   SpO2 98%   BMI 42.91 kg/m   General: Alert, pleasant but slightly anxious appearing woman. NAD. HEENT: NCAT. MMM. CV: RRR, no murmurs.  Resp: CTAB, no wheezing or crackles. Normal WOB on RA.  Ext: Moves all ext spontaneously Skin: Warm, well perfused   ASSESSMENT/PLAN:   Assessment & Plan Panic disorder Uncontrolled.  Provided reassurance that her vital signs are stable and she is unlikely having heart attacks, these episodes are most consistent with panic attacks.  Counseled patient that panic attacks are time-limited and can cause all of the symptoms that she is describing.  Given multiple ED visits, medication changes, and persistent symptoms, will need to establish with a psychiatrist for further medication titration. -Start trazodone 50 mg nightly to help with sleep -Continue Lexapro 20 mg daily -Stop BuSpar given potential side effects -Patient made appointment with a new therapist and psychiatrist while in office today.    Lincoln Brigham, MD [facility]

## 2024-03-03 ENCOUNTER — Ambulatory Visit (INDEPENDENT_AMBULATORY_CARE_PROVIDER_SITE_OTHER): Payer: Medicaid Other | Admitting: Gastroenterology

## 2024-03-03 ENCOUNTER — Encounter: Payer: Self-pay | Admitting: Gastroenterology

## 2024-03-03 ENCOUNTER — Ambulatory Visit: Payer: Self-pay | Admitting: *Deleted

## 2024-03-03 ENCOUNTER — Ambulatory Visit: Admitting: Family Medicine

## 2024-03-03 VITALS — BP 112/70 | HR 73 | Ht 62.0 in | Wt 233.0 lb

## 2024-03-03 VITALS — BP 127/75 | HR 77 | Ht 62.0 in | Wt 234.2 lb

## 2024-03-03 DIAGNOSIS — K5903 Drug induced constipation: Secondary | ICD-10-CM | POA: Diagnosis not present

## 2024-03-03 DIAGNOSIS — F411 Generalized anxiety disorder: Secondary | ICD-10-CM | POA: Diagnosis not present

## 2024-03-03 DIAGNOSIS — K219 Gastro-esophageal reflux disease without esophagitis: Secondary | ICD-10-CM | POA: Diagnosis not present

## 2024-03-03 MED ORDER — ESCITALOPRAM OXALATE 10 MG PO TABS: 10.0000 mg | ORAL_TABLET | Freq: Every day | ORAL | 1 refills | Status: DC

## 2024-03-03 MED ORDER — PANTOPRAZOLE SODIUM 40 MG PO TBEC: 40.0000 mg | DELAYED_RELEASE_TABLET | Freq: Two times a day (BID) | ORAL | 3 refills | Status: DC

## 2024-03-03 NOTE — Assessment & Plan Note (Signed)
 Multiple recent visits for ongoing uncontrolled symptoms. Would greatly benefit from consistent follow up with the same physician. Focused on coping strategies and utilizing medications as prescribed, patient and sister in agreement. Continue follow up with counseling tomorrow and provided psychiatry resources. Will decrease back to Lexapro 10mg  which she was stable on for years. -Decrease to Lexapro 10mg  daily -Continue Hydroxyzine 500mg  TID prn and Trazodone 50mg  at bedtime prn -F/u with counseling tomorrow (2/28) -F/u with PCP in 2 weeks for mood

## 2024-03-03 NOTE — Patient Instructions (Addendum)
 It was wonderful to see you today! Thank you for choosing [facility].   Please bring ALL of your medications with you to every visit.   Today we talked about:  I decreased your Lexapro back down to 10 mg daily to make sure there is not side effects with the medication.  Please utilize the hydroxyzine throughout the day as needed and trazodone prior to bed.  I think counseling is going to do well for you to continue to develop coping skills.  I included a list of psychiatry resources below, please utilize them to try to get in psychiatry care for further evaluation.  I think follow-up every 2 weeks until you are improving with the same physician would be beneficial as they can better assess your improvement. Please keep in mind your physical symptoms such as palpitations and stomach upset are likely a manifestation of your anxiety.  It is safe for you to exercise, I highly recommend getting back to walking and trying deep breathing and mindfulness exercises.  Please follow up in 2 weeks  Call the clinic at [phone] if your symptoms worsen or you have any concerns.  Please be sure to schedule follow up at the front desk before you leave today.   Elberta Fortis, DO Family Medicine     Psychiatry Resource List (Adults and Children) Most of these providers will take Medicaid. please consult your insurance for a complete and updated list of available providers. When calling to make an appointment have your insurance information available to confirm you are covered.   [facility]Psychiatry and Counseling [address] [place], [place] [address] [phone]  [facility]  [address] [place], [place] [phone] Crisis [phone]   [name]:   [facility]: [address]     [phone]   [facility]: [address] [place]. [place],        [phone] [place]: [address] Suite [phone],     [phone] [place]: [phone] [address],                   [phone] Children: [facility] [address]         [phone]  [facility] (virtual only) [phone]   [facility]  (Psychiatry only; Adults /children 12 and over, will take Medicaid)  [address] Laurell Josephs [address], [place], [place] [address]       [phone]   [facility] (Psychiatry & counseling ; adults & children ; will take Medicaid [address]  [address]  [place] [address]  Go on-line to complete referral ( [email or web address]://[email or web address]/en/make-[email or web address]-referral [phone]    (Spanish speaking therapists)  [facility]  Psychiatry & counseling; Adults and children;  Call Registration prior to scheduling an appointment [phone] [address]    [place], [place] [address]    [phone]  [facility] (Psychiatry & counseling; adults & children; Medicare no Medicaid)  [address]   [place], [place]  [address]      [phone]    [facility] (up to age 42)  Psychiatry & counseling ,will take Medicaid, must do counseling to receive psychiatry services  [address] [place] [address]        [phone]  [facility] (Psychiatry & counseling; adults & children; will take Medicaid) Will need a referral from provider  [address],  [place], [place]  [phone]   [facility] --- Walk-In Mon-Friday 8am-3pm ( will take Medicaid, Psychiatry, Adults & children,  [address], [place], [place]   [phone]   [facility] [place], Walk-in M-F 8am-12pm and 1pm -3pm   (Counseling, Psychiatry, will take Medicaid, adults & children)  [address], [place], [place]  [phone]

## 2024-03-03 NOTE — Progress Notes (Signed)
 Chief Complaint: uncontrolled GERD Primary GI Doctor:Dr. Rhea Belton  HPI:  Patient is a  33  year old African American female patient with past medical history of GERD, hiatal hernia, generalized anxiety disorder, iron deficiency anemia, who presents for a complaint of uncontrolled GERD  06/04/2023 patient last seen in GI office by Gunnar Fusi, NP for complaints of GERD.  Patient was recently on pantoprazole 20 mg that was increased to 40 mg and since then has seen improvement.  Patient also had issues with constipation thought to be related to iron supplementation for IDA or Victoza.  Patient currently taking MiraLAX and senna.  Chronic IDA, evaluated by [place] in 2013.  Interval History    Patient presents with main complaint of increased GERD along with chest discomfort since March 5th. She states she has started to have panic attacks on daily basis where she is having a hard time catching her breath. She states she has regurgitated food up several hours later.Patient currently on pantoprazole 40 mg p.o. daily.  She has cardiac evaluation is in June, mother had heart attack at age 82.  She has history of constipation from being on oral iron that is managed with OTC  MiraLAX and senna prn.  She is seeing counselor tomorrow and tying to find psychiatrist to evaluate her. She has hydroxyzine prn she uses for anxiety.   Wt Readings from Last 3 Encounters:  03/03/24 233 lb (105.7 kg)  03/03/24 234 lb 3.2 oz (106.2 kg)  03/01/24 234 lb 9.6 oz (106.4 kg)     Past Medical History:  Diagnosis Date   Abdominal pain 01/06/2012   Acute tension headache 07/27/2019   Anemia    Ankle pain    Anxiety    Asthma    Birth control counseling 08/26/2011   BMI 40.0-44.9, adult (HCC) 02/27/2011   Dysuria 01/13/2013   Epigastric pain 09/13/2019   GERD (gastroesophageal reflux disease)    Heart murmur    Hiatal hernia    Obesity (BMI 30-39.9)    Pre-eclampsia    off med since 4/23   Secondary amenorrhea  03/25/2018   Trichomonas infection 06/17/2022    Past Surgical History:  Procedure Laterality Date   CESAREAN SECTION  09/02/2010   For macrosomia, but son was 7 lb 12 oz   CESAREAN SECTION N/A 10/06/2021   Procedure: CESAREAN SECTION;  Surgeon: McBee Bing, MD;  Location: [facility];  Service: Obstetrics;  Laterality: N/A;   CHOLECYSTECTOMY N/A 06/16/2022   Procedure: LAPAROSCOPIC  CHOLECYSTECTOMY;  Surgeon: Berna Bue, MD;  Location: [facility] ORS;  Service: General;  Laterality: N/A;   TONSILLECTOMY AND ADENOIDECTOMY      Current Outpatient Medications  Medication Sig Dispense Refill   albuterol (VENTOLIN HFA) 108 (90 Base) MCG/ACT inhaler INHALE 2 PUFFS BY MOUTH EVERY 4 HOURS AS NEEDED FOR WHEEZING OR SHORTNESS OF BREATH (COUGH). 18 g 1   Blood Pressure KIT Please use to check blood pressure as needed. 1 kit 0   escitalopram (LEXAPRO) 10 MG tablet Take 1 tablet (10 mg total) by mouth daily. 90 tablet 1   etonogestrel (NEXPLANON) 68 MG IMPL implant 1 each by Subdermal route once.     ferrous sulfate (FEROSUL) 325 (65 FE) MG tablet TAKE 1 TABLET BY MOUTH EVERY OTHER DAY 30 tablet 2   fluticasone (FLOVENT HFA) 110 MCG/ACT inhaler Inhale 2 puffs into the lungs 2 (two) times daily. 1 each 1   hydroxypropyl methylcellulose / hypromellose (ISOPTO TEARS / GONIOVISC) 2.5 %  ophthalmic solution Place 1 drop into both eyes 4 (four) times daily as needed for dry eyes. 15 mL 0   hydrOXYzine (ATARAX) 25 MG tablet Take 2 tablets (50 mg total) by mouth 3 (three) times daily as needed for anxiety. 90 tablet 0   lidocaine (XYLOCAINE) 2 % solution Use as directed 15 mLs in the mouth or throat as needed for mouth pain. 100 mL 0   loratadine (CLARITIN) 10 MG tablet Take 1 tablet (10 mg total) by mouth daily. 30 tablet 11   LORazepam (ATIVAN) 1 MG tablet Take 1 tablet (1 mg total) by mouth 2 (two) times daily as needed for anxiety (panice attacks). 15 tablet 0   pantoprazole (PROTONIX) 40 MG tablet Take 1  tablet (40 mg total) by mouth daily. 30 tablet 3   polyethylene glycol (MIRALAX / GLYCOLAX) 17 g packet Take 17 g by mouth 2 (two) times daily. 14 each 1   senna (SENOKOT) 8.6 MG TABS tablet Take 1 tablet (8.6 mg total) by mouth 2 (two) times daily. 120 tablet 0   traZODone (DESYREL) 50 MG tablet Take 1 tablet (50 mg total) by mouth at bedtime as needed for sleep. 30 tablet 3   trimethoprim-polymyxin b (POLYTRIM) ophthalmic solution Place 1 drop into the left eye every 6 (six) hours. 10 mL 0   No current facility-administered medications for this visit.    Allergies as of 03/03/2024 - Review Complete 03/03/2024  Allergen Reaction Noted   Morphine Anaphylaxis 10/04/2022   Ibuprofen Nausea And Vomiting 10/19/2017   Morphine and codeine Other (See Comments) 02/06/2013    Family History  Problem Relation Age of Onset   Diabetes Mother    Hypertension Mother    Obesity Mother    Heart attack Mother 96       Death   Other Sister        per-diabetic   Esophageal cancer Neg Hx    Colon cancer Neg Hx    Liver disease Neg Hx     Review of Systems:    Constitutional: No weight loss, fever, chills, weakness or fatigue HEENT: Eyes: No change in vision               Ears, Nose, Throat:  No change in hearing or congestion Skin: No rash or itching Cardiovascular: No chest pain, chest pressure or palpitations   Respiratory: No SOB or cough Gastrointestinal: See HPI and otherwise negative Genitourinary: No dysuria or change in urinary frequency Neurological: No headache, dizziness or syncope Musculoskeletal: No new muscle or joint pain Hematologic: No bleeding or bruising Psychiatric: No history of depression or anxiety    Physical Exam:  Vital signs: BP 112/70 Comment: large cuff  Pulse 73   Ht 5\' 2"  (1.575 m)   Wt 233 lb (105.7 kg)   LMP 02/08/2024 (Exact Date)   BMI 42.62 kg/m   Constitutional:   Pleasant African American female appears to be in NAD, Well developed, Well  nourished, alert and cooperative Throat: Oral cavity and pharynx without inflammation, swelling or lesion.  Respiratory: Respirations even and unlabored. Lungs clear to auscultation bilaterally.   No wheezes, crackles, or rhonchi.  Cardiovascular: Normal S1, S2. Regular rate and rhythm. No peripheral edema, cyanosis or pallor.  Gastrointestinal:  Soft, nondistended, nontender. No rebound or guarding. Normal bowel sounds. No appreciable masses or hepatomegaly. Rectal:  Not performed.  Msk:  Symmetrical without gross deformities. Without edema, no deformity or joint abnormality.  Neurologic:  Alert and  oriented x4;  grossly normal neurologically.  Skin:   Dry and intact without significant lesions or rashes. Psychiatric: Oriented to person, place and time. Demonstrates good judgement and reason without abnormal affect or behaviors.  RELEVANT LABS AND IMAGING: CBC    Latest Ref Rng & Units 02/26/2024   11:01 PM 02/22/2024    2:49 AM 02/13/2024    3:45 PM  CBC  WBC 4.0 - 10.5 K/uL 9.2  8.8  10.0   Hemoglobin 12.0 - 15.0 g/dL 16.1  09.6  04.5   Hematocrit 36.0 - 46.0 % 38.9  38.5  42.0   Platelets 150 - 400 K/uL 331  328  400      CMP     Latest Ref Rng & Units 02/26/2024   11:01 PM 02/22/2024    2:49 AM 02/13/2024    3:45 PM  CMP  Glucose 70 - 99 mg/dL 409  811  90   BUN 6 - 20 mg/dL 7  13  10    Creatinine 0.44 - 1.00 mg/dL 9.14  7.82  9.56   Sodium 135 - 145 mmol/L 135  138  137   Potassium 3.5 - 5.1 mmol/L 3.3  3.5  3.7   Chloride 98 - 111 mmol/L 103  106  102   CO2 22 - 32 mmol/L 22  23  24    Calcium 8.9 - 10.3 mg/dL 8.9  9.2  21.3   Total Protein 6.5 - 8.1 g/dL  7.1  8.7   Total Bilirubin 0.0 - 1.2 mg/dL  0.5  0.8   Alkaline Phos 38 - 126 U/L  55  69   AST 15 - 41 U/L  11  16   ALT 0 - 44 U/L  15  19      Lab Results  Component Value Date   TSH 1.990 03/16/2023  03/13/2022 CT abdomen pelvis with contrast IMPRESSION: 1. Similar right-sided perinephric fat stranding, most  conspicuously about the inferior pole, with small foci of renal cortical hypoenhancement. Findings remain consistent with pyelonephritis. 2. Fat stranding about the incompletely opacified right ureter. There are again rounded calculi in the vicinity of the bilateral distal ureters, most likely phleboliths, ureteral calculi not favored. 3. Trace right pleural effusion. 03/10/2022 ultrasound abdomen right upper quadrant IMPRESSION: Cholelithiasis with borderline wall thickening and positive sonographic Murphy sign, findings indicative of acute cholecystitis. 10/27/2019 EGD with Dr. Leone Payor Impression:  - 4 cm hiatal hernia.  - The examination was otherwise normal.  - No specimens collected. 08/13/2012 flexible sigmoidoscopy/EGD with Dr. Rhea Belton for IDA Flex sig Endoscopic impression: The colonic mucosa appeared normal in the descending colon, sigmoid colon, and rectum.  No source found to explain rectal bleeding or iron deficiency anemia EGD Endoscopic impression There was LA class a esophagitis noted The esophagus was otherwise normal 6 cm hiatal hernia Of close of the stomach appeared normal The duodenal mucosa showed no abnormalities in the bulb and second portion of the duodenum  Assessment: Encounter Diagnoses  Name Primary?   Gastroesophageal reflux disease, unspecified whether esophagitis present Yes   GAD (generalized anxiety disorder)    Drug-induced constipation        33 year old female patient that presents with history of GERD with esophagitis not controlled with pantoprazole 40 mg p.o. daily.  Patient does also have history of generalized anxiety which is not controlled at this time.  She is seeing a Film/video editor but having difficulty finding a psychiatrist. I suspect most of her symptoms are due  tot he anxiety, however I told her we could try Pantoprazole 40 mg twice daily for a short time. Reinforced strict GERD diet, no late meals. Recommended she follow-up as  scheduled with counselor.     Her [facility] managed with OTC Miralax and Senna as needed. No changes needed. Plan: -Increase Pantoprazole 40 mg daily to 40 mg twice daily -Strict GERD diet, no late meals 3-4 hours before bed -Gave her handout on [facility] psychiatrist list  Thank you for the courtesy of this consult. Please call me with any questions or concerns.   Lakaisha Danish, FNP-C [facility] 03/03/2024, 3:14 PM  Cc: Bess Kinds, MD

## 2024-03-03 NOTE — Progress Notes (Signed)
    SUBJECTIVE:   CHIEF COMPLAINT / HPI:   Anxiety Seen 03/01/2024 for similar issue, she was started on Trazodone for sleep and stopped Buspar due to side effects. Multiple visits over the past month for recurrent anxiety attacks.  Today patient feels she has been jumpy since increasing Lexapro on 02/04/2024. Relates this all started a month ago when a close client died at work and she took it hard. She now has recurrent fear that her heart will stop and she will die. Mother with CHF at young age, worried she is stressing her heart.  Has been on Lexapro since 2023, was doing well in 10mg  until a month ago. Using Hydroxyzine twice per day. Did not take Trazodone due to concern it worsening her menstrual bleeding. Starting counseling tomorrow.  Still feeling jumpy and fearful. Sometimes stomach upset. Chest tightness during episodes.  PERTINENT  PMH / PSH: GAD, GERD, asthma, allergies  OBJECTIVE:   BP 127/75   Pulse 77   Ht 5\' 2"  (1.575 m)   Wt 234 lb 3.2 oz (106.2 kg)   LMP 02/08/2024 (Exact Date)   SpO2 98%   BMI 42.84 kg/m    General: Alert, no apparent distress, well groomed HEENT: Normocephalic, atraumatic, moist mucus membranes, neck supple Respiratory: Normal respiratory effort GI: Non-distended Skin: No rashes, no jaundice Psych: Intermittently tearful. Changing from ideas quickly. Fidgety.  ASSESSMENT/PLAN:   Assessment & Plan GAD (generalized anxiety disorder) Multiple recent visits for ongoing uncontrolled symptoms. Would greatly benefit from consistent follow up with the same physician. Focused on coping strategies and utilizing medications as prescribed, patient and sister in agreement. Continue follow up with counseling tomorrow and provided psychiatry resources. Will decrease back to Lexapro 10mg  which she was stable on for years. -Decrease to Lexapro 10mg  daily -Continue Hydroxyzine 500mg  TID prn and Trazodone 50mg  at bedtime prn -F/u with counseling tomorrow  (2/28) -F/u with PCP in 2 weeks for mood    Dr. Elberta Fortis, DO [facility]

## 2024-03-03 NOTE — Patient Instructions (Addendum)
 _______________________________________________________  If your blood pressure at your visit was 140/90 or greater, please contact your primary care physician to follow up on this.  _______________________________________________________  If you are age 32 or older, your body mass index should be between 23-30. Your Body mass index is 42.62 kg/m. If this is out of the aforementioned range listed, please consider follow up with your Primary Care Provider.  If you are age 67 or younger, your body mass index should be between 19-25. Your Body mass index is 42.62 kg/m. If this is out of the aformentioned range listed, please consider follow up with your Primary Care Provider.   ________________________________________________________  The [facility] providers would like to encourage you to use [facility] to communicate with providers for non-urgent requests or questions.  Due to long hold times on the telephone, sending your provider a message by [facility] may be a faster and more efficient way to get a response.  Please allow 48 business hours for a response.  Please remember that this is for non-urgent requests.  _______________________________________________________  We have sent the following medications to your pharmacy for you to pick up at your convenience: Protonix 2 times a day   It was a pleasure to see you today!  Thank you for trusting me with your gastrointestinal care!

## 2024-03-04 ENCOUNTER — Other Ambulatory Visit: Payer: Self-pay | Admitting: Student

## 2024-03-04 DIAGNOSIS — D649 Anemia, unspecified: Secondary | ICD-10-CM

## 2024-03-04 DIAGNOSIS — F410 Panic disorder [episodic paroxysmal anxiety] without agoraphobia: Secondary | ICD-10-CM | POA: Diagnosis not present

## 2024-03-04 DIAGNOSIS — F411 Generalized anxiety disorder: Secondary | ICD-10-CM | POA: Diagnosis not present

## 2024-03-04 MED ORDER — FERROUS SULFATE 325 (65 FE) MG PO TABS: 325.0000 mg | ORAL_TABLET | ORAL | 2 refills | Status: DC

## 2024-03-04 MED ORDER — HYDROXYZINE HCL 50 MG PO TABS: 50.0000 mg | ORAL_TABLET | Freq: Three times a day (TID) | ORAL | 1 refills | Status: AC | PRN

## 2024-03-04 NOTE — Telephone Encounter (Signed)
 Patient called stating she is needing her medications refilled and sent to a different pharmacy.   Pharmacy:  [facility] on [address]  Medications:  Ferrous Sulfate HydrOXYzine

## 2024-03-04 NOTE — Addendum Note (Signed)
 Addended by: Pamelia Hoit on: 03/04/2024 04:37 PM   Modules accepted: Orders

## 2024-03-07 ENCOUNTER — Encounter: Payer: Self-pay | Admitting: Family Medicine

## 2024-03-07 NOTE — Progress Notes (Signed)
 Addendum: Reviewed and agree with assessment and management plan. Asha Grumbine, Carie Caddy, MD

## 2024-03-08 ENCOUNTER — Telehealth: Payer: Self-pay | Admitting: Student

## 2024-03-08 MED ORDER — DOXEPIN HCL 10 MG PO CAPS
10.0000 mg | ORAL_CAPSULE | Freq: Every day | ORAL | 0 refills | Status: DC
Start: 2024-03-08 — End: 2024-04-07

## 2024-03-08 NOTE — Telephone Encounter (Signed)
 Called patient to discuss issues with sleep. Patient notes that she's been taking trazodone for last 4 days, it helps her go to sleep, but she wakes up with anxiety and has a hard time falling back to sleep. She note's she is getting 3 hours of sleep a night. She notes she will try to take the hydroxizine for panic symptoms, when she wakes up, it will help her doze off, but she doesn't stay asleep long.  I discussed with patient that we would try doxepin, to see if it would help her sleep, and stop the trazodone. Patient in agreement with plan. I let patient know that she may have her anxiety to high for any medicine to work, and this may continue to be an issue until we can better control her anxiety.  Patient notes that she saw her therapist x1 last week, and will start seeing her weekly next week. She also notes that they can manage medicine at that place, and I encouraged her to have them look into her meds, to see what they could offer for anxiety, or possibly offer a cheek swab to see what medicine she is sensitive to. Patient was in agreement with plan.

## 2024-03-09 ENCOUNTER — Emergency Department (HOSPITAL_COMMUNITY)

## 2024-03-09 ENCOUNTER — Other Ambulatory Visit: Payer: Self-pay

## 2024-03-09 ENCOUNTER — Emergency Department (HOSPITAL_COMMUNITY)
Admission: EM | Admit: 2024-03-09 | Discharge: 2024-03-09 | Disposition: A | Discharge status: Home / Self Care | Attending: Emergency Medicine | Admitting: Emergency Medicine

## 2024-03-09 DIAGNOSIS — R0789 Other chest pain: Secondary | ICD-10-CM | POA: Insufficient documentation

## 2024-03-09 DIAGNOSIS — F419 Anxiety disorder, unspecified: Secondary | ICD-10-CM | POA: Insufficient documentation

## 2024-03-09 DIAGNOSIS — Z9049 Acquired absence of other specified parts of digestive tract: Secondary | ICD-10-CM | POA: Diagnosis not present

## 2024-03-09 DIAGNOSIS — R109 Unspecified abdominal pain: Secondary | ICD-10-CM | POA: Diagnosis not present

## 2024-03-09 DIAGNOSIS — R1013 Epigastric pain: Secondary | ICD-10-CM | POA: Diagnosis not present

## 2024-03-09 DIAGNOSIS — R079 Chest pain, unspecified: Secondary | ICD-10-CM | POA: Diagnosis not present

## 2024-03-09 LAB — COMPREHENSIVE METABOLIC PANEL WITH GFR
ALT: 18 U/L (ref 0–44)
AST: 13 U/L — ABNORMAL LOW (ref 15–41)
Albumin: 3.7 g/dL (ref 3.5–5.0)
Alkaline Phosphatase: 61 U/L (ref 38–126)
Anion gap: 11 (ref 5–15)
BUN: 13 mg/dL (ref 6–20)
CO2: 19 mmol/L — ABNORMAL LOW (ref 22–32)
Calcium: 9 mg/dL (ref 8.9–10.3)
Chloride: 103 mmol/L (ref 98–111)
Creatinine, Ser: 1.17 mg/dL — ABNORMAL HIGH (ref 0.44–1.00)
GFR, Estimated: >60 mL/min (ref >60)
Glucose, Bld: 93 mg/dL (ref 70–99)
Potassium: 4.3 mmol/L (ref 3.5–5.1)
Sodium: 133 mmol/L — ABNORMAL LOW (ref 135–145)
Total Bilirubin: 1 mg/dL (ref 0.0–1.2)
Total Protein: 7.4 g/dL (ref 6.5–8.1)

## 2024-03-09 LAB — CBC WITH DIFFERENTIAL/PLATELET
Abs Immature Granulocytes: 0.03 10*3/uL (ref 0.00–0.07)
Basophils Absolute: 0 10*3/uL (ref 0.0–0.1)
Basophils Relative: 1 %
Eosinophils Absolute: 0 10*3/uL (ref 0.0–0.5)
Eosinophils Relative: 1 %
HCT: 41 % (ref 36.0–46.0)
Hemoglobin: 13.2 g/dL (ref 12.0–15.0)
Immature Granulocytes: 1 %
Lymphocytes Relative: 26 %
Lymphs Abs: 1.6 10*3/uL (ref 0.7–4.0)
MCH: 28.3 pg (ref 26.0–34.0)
MCHC: 32.2 g/dL (ref 30.0–36.0)
MCV: 87.8 fL (ref 80.0–100.0)
Monocytes Absolute: 0.4 10*3/uL (ref 0.1–1.0)
Monocytes Relative: 6 %
Neutro Abs: 4.1 10*3/uL (ref 1.7–7.7)
Neutrophils Relative %: 65 %
Platelets: 327 10*3/uL (ref 150–400)
RBC: 4.67 MIL/uL (ref 3.87–5.11)
RDW: 13.4 % (ref 11.5–15.5)
WBC: 6.2 10*3/uL (ref 4.0–10.5)
nRBC: 0 % (ref 0.0–0.2)

## 2024-03-09 LAB — URINALYSIS, ROUTINE W REFLEX MICROSCOPIC
Bilirubin Urine: NEGATIVE
Glucose, UA: NEGATIVE mg/dL
Ketones, ur: NEGATIVE mg/dL
Leukocytes,Ua: NEGATIVE
Nitrite: NEGATIVE
Protein, ur: NEGATIVE mg/dL
Specific Gravity, Urine: 1.006 (ref 1.005–1.030)
pH: 6 (ref 5.0–8.0)

## 2024-03-09 LAB — POC OCCULT BLOOD, ED: Fecal Occult Bld: NEGATIVE

## 2024-03-09 LAB — TROPONIN I (HIGH SENSITIVITY): Troponin I (High Sensitivity): <2 ng/L (ref <18)

## 2024-03-09 LAB — LIPASE, BLOOD: Lipase: 24 U/L (ref 11–51)

## 2024-03-09 LAB — PREGNANCY, URINE: Preg Test, Ur: NEGATIVE

## 2024-03-09 MED ORDER — IOHEXOL 350 MG/ML SOLN
75.0000 mL | Freq: Once | INTRAVENOUS | Status: AC | PRN
  Administered 2024-03-09: 75 mL via INTRAVENOUS

## 2024-03-09 MED ORDER — LORAZEPAM 2 MG/ML IJ SOLN
1.0000 mg | Freq: Once | INTRAMUSCULAR | Status: AC
  Administered 2024-03-09: 1 mg via INTRAVENOUS
  Filled 2024-03-09: qty 1

## 2024-03-09 NOTE — ED Provider Notes (Signed)
 [place] EMERGENCY DEPARTMENT AT [facility] Provider Note   CSN: [id number] Arrival date & time: 03/09/24  4098     History  Chief Complaint  Patient presents with   Chest Pain   Anxiety   Flank Pain   Abdominal Pain   Melena    Veronica [name] is a 33 y.o. female with a history of obesity, GERD, and anxiety presents the ED today for multiple concerns.  Patient reports that her anxiety has been worse over the past several weeks.  States that one of her friends at work recently passed away and she has been having increased number of panic attacks since then.  Reports chest pain and generalized abdominal pain associated with the panic attacks.  States that the symptoms resolve once the panic attacks are over.  She also endorses left-sided flank pain that is been going on for several weeks as well without associated dysuria or hematuria.  Lastly she reports black stools that started yesterday.  She takes iron supplementation for which she has been taking for years but states she has never had dark stools until today.  Denies any chronic NSAID use, alcohol use or history of GI bleeds in the past.    Home Medications Prior to Admission medications   Medication Sig Start Date End Date Taking? Authorizing Provider  albuterol (VENTOLIN HFA) 108 (90 Base) MCG/ACT inhaler INHALE 2 PUFFS BY MOUTH EVERY 4 HOURS AS NEEDED FOR WHEEZING OR SHORTNESS OF BREATH (COUGH). 11/13/23   Carney Living, MD  Blood Pressure KIT Please use to check blood pressure as needed. 07/15/23   Westley Chandler, MD  doxepin (SINEQUAN) 10 MG capsule Take 1 capsule (10 mg total) by mouth at bedtime. 03/08/24   Bess Kinds, MD  escitalopram (LEXAPRO) 10 MG tablet Take 1 tablet (10 mg total) by mouth daily. 03/03/24   Elberta Fortis, MD  etonogestrel (NEXPLANON) 68 MG IMPL implant 1 each by Subdermal route once.    [provider]  ferrous sulfate (FEROSUL) 325 (65 FE) MG tablet Take 1 tablet (325  mg total) by mouth every other day. 03/04/24   Bess Kinds, MD  fluticasone (FLOVENT HFA) 110 MCG/ACT inhaler Inhale 2 puffs into the lungs 2 (two) times daily. 11/13/23   Carney Living, MD  hydroxypropyl methylcellulose / hypromellose (ISOPTO TEARS / GONIOVISC) 2.5 % ophthalmic solution Place 1 drop into both eyes 4 (four) times daily as needed for dry eyes. 10/24/23   Raspet, Noberto Retort, [name]  hydrOXYzine (ATARAX) 50 MG tablet Take 1 tablet (50 mg total) by mouth 3 (three) times daily as needed for anxiety. 03/04/24 04/03/24  Bess Kinds, MD  lidocaine (XYLOCAINE) 2 % solution Use as directed 15 mLs in the mouth or throat as needed for mouth pain. 12/27/23   Wynonia Lawman A, NP  loratadine (CLARITIN) 10 MG tablet Take 1 tablet (10 mg total) by mouth daily. 05/22/23   Cora Collum, DO  LORazepam (ATIVAN) 1 MG tablet Take 1 tablet (1 mg total) by mouth 2 (two) times daily as needed for anxiety (panice attacks). 02/13/24   Tegeler, Canary Brim, MD  pantoprazole (PROTONIX) 40 MG tablet Take 1 tablet (40 mg total) by mouth 2 (two) times daily. 03/03/24   May, Deanna J, NP  polyethylene glycol (MIRALAX / GLYCOLAX) 17 g packet Take 17 g by mouth 2 (two) times daily. 05/15/23   Valetta Close, MD  senna (SENOKOT) 8.6 MG TABS tablet Take 1 tablet (8.6  mg total) by mouth 2 (two) times daily. 05/15/23   Valetta Close, MD  trimethoprim-polymyxin b Joaquim Lai) ophthalmic solution Place 1 drop into the left eye every 6 (six) hours. 10/24/23   Raspet, Noberto Retort, [name]      Allergies    Morphine, Ibuprofen, and Morphine and codeine    Review of Systems   Review of Systems  Cardiovascular:  Positive for chest pain.  All other systems reviewed and are negative.   Physical Exam Updated Vital Signs BP 126/75 (BP Location: Right Arm)   Pulse 77   Temp 98.8 F (37.1 C) (Oral)   Resp 16   LMP 02/29/2024 (Exact Date)   SpO2 98%  Physical Exam Vitals and nursing note reviewed. Exam conducted with  a chaperone present.  Constitutional:      General: She is not in acute distress.    Appearance: Normal appearance.  HENT:     Head: Normocephalic and atraumatic.     Mouth/Throat:     Mouth: Mucous membranes are moist.  Eyes:     Conjunctiva/sclera: Conjunctivae normal.     Pupils: Pupils are equal, round, and reactive to light.  Cardiovascular:     Rate and Rhythm: Normal rate and regular rhythm.     Pulses: Normal pulses.     Heart sounds: Normal heart sounds.  Pulmonary:     Effort: Pulmonary effort is normal.     Breath sounds: Normal breath sounds.  Abdominal:     Palpations: Abdomen is soft.     Tenderness: There is abdominal tenderness. There is left CVA tenderness.     Comments: Tenderness to the epigastrium on palpation without guarding or rebound as well as left CVA tenderness  Genitourinary:    Rectum: Normal. Guaiac result negative.  Musculoskeletal:        General: Normal range of motion.     Cervical back: Normal range of motion.     Right lower leg: No edema.     Left lower leg: No edema.  Skin:    General: Skin is warm and dry.     Findings: No rash.  Neurological:     General: No focal deficit present.     Mental Status: She is alert.     Sensory: No sensory deficit.     Motor: No weakness.     Gait: Gait normal.  Psychiatric:        Mood and Affect: Mood normal.        Behavior: Behavior normal.    ED Results / Procedures / Treatments   Labs (all labs ordered are listed, but only abnormal results are displayed) Labs Reviewed  URINALYSIS, ROUTINE W REFLEX MICROSCOPIC - Abnormal; Notable for the following components:      Result Value   Color, Urine STRAW (*)    Hgb urine dipstick LARGE (*)    Bacteria, UA RARE (*)    All other components within normal limits  COMPREHENSIVE METABOLIC PANEL WITH GFR - Abnormal; Notable for the following components:   Sodium 133 (*)    CO2 19 (*)    Creatinine, Ser 1.17 (*)    AST 13 (*)    All other components  within normal limits  PREGNANCY, URINE  CBC WITH DIFFERENTIAL/PLATELET  LIPASE, BLOOD  POC OCCULT BLOOD, ED  TROPONIN I (HIGH SENSITIVITY)    EKG None  Radiology CT ABDOMEN PELVIS W CONTRAST Result Date: 03/09/2024 CLINICAL DATA:  Abdominal pain, acute, nonlocalized EXAM: CT ABDOMEN AND PELVIS  WITH CONTRAST TECHNIQUE: Multidetector CT imaging of the abdomen and pelvis was performed using the standard protocol following bolus administration of intravenous contrast. RADIATION DOSE REDUCTION: This exam was performed according to the departmental dose-optimization program which includes automated exposure control, adjustment of the mA and/or kV according to patient size and/or use of iterative reconstruction technique. CONTRAST:  75mL OMNIPAQUE IOHEXOL 350 MG/ML SOLN COMPARISON:  CT abdomen/pelvis dated March 13, 2022. FINDINGS: Lower chest: No acute abnormality. Hepatobiliary: No focal liver abnormality is seen. Status post cholecystectomy. No biliary dilatation. Pancreas: Unremarkable. No pancreatic ductal dilatation or surrounding inflammatory changes. Spleen: Normal in size without focal abnormality. Adrenals/Urinary Tract: Adrenal glands are unremarkable. Kidneys are normal, without renal calculi, focal lesion, or hydronephrosis. Bladder is unremarkable. Stomach/Bowel: Stomach is within normal limits. Appendix appears normal. No evidence of bowel wall thickening, distention, or inflammatory changes. Vascular/Lymphatic: No significant vascular findings are present. No enlarged abdominal or pelvic lymph nodes. Reproductive: Uterus and bilateral adnexa are unremarkable. Other: No abdominal wall hernia or abnormality. No abdominopelvic ascites. Musculoskeletal: No acute or significant osseous findings. IMPRESSION: No acute localizing findings in the abdomen or pelvis. Electronically Signed   By: Hart Robinsons M.D.   On: 03/09/2024 15:28   DG Chest 2 View Result Date: 03/09/2024 CLINICAL DATA:  Chest  pain. EXAM: CHEST - 2 VIEW COMPARISON:  Chest radiograph dated 02/26/2024. FINDINGS: The heart size and mediastinal contours are within normal limits. Both lungs are clear. No pleural effusion or pneumothorax. No acute osseous abnormality. IMPRESSION: No acute cardiopulmonary findings. Electronically Signed   By: Hart Robinsons M.D.   On: 03/09/2024 11:21    Procedures Procedures: not indicated.   Medications Ordered in ED Medications  LORazepam (ATIVAN) injection 1 mg (1 mg Intravenous Given 03/09/24 1338)  iohexol (OMNIPAQUE) 350 MG/ML injection 75 mL (75 mLs Intravenous Contrast Given 03/09/24 1337)    ED Course/ Medical Decision Making/ A&P                                 Medical Decision Making Amount and/or Complexity of Data Reviewed Labs: ordered. Radiology: ordered.  Risk Prescription drug management.   This patient presents to the ED for concern of chest pain, this involves an extensive number of treatment options, and is a complaint that carries with it a high risk of complications and morbidity.   Differential diagnosis includes: ACS, costochondritis, pleurisy, acute anxiety, IBS, IBD, gastroenteritis, gastritis, gastric ulcer, GI bleed, UTI, pyelonephritis, nephrolithiasis, urolithiasis, etc. PERC negative - low suspicion for PE.   Comorbidities  See HPI above   Additional History  Additional history obtained from prior records   Cardiac Monitoring / EKG  The patient was maintained on a cardiac monitor.  I personally viewed and interpreted the cardiac monitored which showed: NSR with a heart rate of 73 bpm.   Lab Tests  I ordered and personally interpreted labs.  The pertinent results include:   FOBT is negative for occult blood Troponin is negative CMP and CBC are reassuring Lipase is unremarkable   Imaging Studies  I ordered imaging studies including CXR and CT abdomen/pelvis I independently visualized and interpreted imaging which showed:  No  acute cardiopulmonary findings on CXR. No acute localizing findings in the abdomen or pelvis. I agree with the radiologist interpretation   Problem List / ED Course / Critical Interventions / Medication Management  Patient reports increased panic attacks over the past several weeks  after one of her close friends/coworkers passed away.  States that this happened was when her female were passed away.  She reports panic attack prior to arrival with pain in the chest and abdomen.  Additionally she reports left flank pain that has been going on intermittently for several weeks.  She also noticed black stools last night.  She takes ferrous sulfate but states has been on for a while several black stools until last night.  States that the consistency of her bowel movements have been the same.  No recent alcohol use or chronic NSAID use.  No history of GI bleeds in the past. I ordered medications including: Lorazepam for acute anxiety Reevaluation of the patient after these medicines showed that the patient improved I have reviewed the patients home medicines and have made adjustments as needed   Social Determinants of Health  Access to healthcare   Test / Admission - Considered  Discussed findings with patient.  All questions were answered. She is hemodynamically stable to fever at home. Return precautions given.       Final Clinical Impression(s) / ED Diagnoses Final diagnoses:  Anxiety  Other chest pain    Rx / DC Orders ED Discharge Orders     None         Maxwell Marion, [name] 03/09/24 1540    Gloris Manchester, MD 03/09/24 1544

## 2024-03-09 NOTE — Discharge Instructions (Addendum)
 As discussed, your labs and imaging are reassuring.  Follow-up with your primary care provider in the next week for reevaluation of your symptoms.  Please follow-up with your counselor as well for further evaluation.  Get help right away if: Your heart feels like it is racing. You have shortness of breath. You have thoughts of hurting yourself or others.

## 2024-03-09 NOTE — ED Triage Notes (Signed)
 Pt. Stated, Ive had anxiety for 3 weeks. Im also having chest pain, left flank pain, stomach pain, and my stool is black. Im not sure if this all from anxiety .

## 2024-03-10 ENCOUNTER — Encounter (INDEPENDENT_AMBULATORY_CARE_PROVIDER_SITE_OTHER): Payer: Self-pay | Admitting: Internal Medicine

## 2024-03-10 ENCOUNTER — Encounter (INDEPENDENT_AMBULATORY_CARE_PROVIDER_SITE_OTHER): Payer: Self-pay

## 2024-03-10 DIAGNOSIS — F411 Generalized anxiety disorder: Secondary | ICD-10-CM | POA: Diagnosis not present

## 2024-03-11 ENCOUNTER — Telehealth: Payer: Self-pay

## 2024-03-14 ENCOUNTER — Encounter (INDEPENDENT_AMBULATORY_CARE_PROVIDER_SITE_OTHER): Payer: Self-pay

## 2024-03-14 ENCOUNTER — Encounter: Payer: Self-pay | Admitting: Student

## 2024-03-14 NOTE — Progress Notes (Signed)
 Complex Care Management Note  Care Guide Note 03/14/2024 Name: Veronica [name] MRN: [id number] DOB: 10/10/1991  Veronica [name] is a 33 y.o. year old female who sees Bess Kinds, MD for primary care. I reached out to Calcium R Childress by phone today to offer complex care management services.  [name] was given information about Complex Care Management services today including:   The Complex Care Management services include support from the care team which includes your Nurse Care Manager, Clinical Social Worker, or Pharmacist.  The Complex Care Management team is here to help remove barriers to the health concerns and goals most important to you. Complex Care Management services are voluntary, and the patient may decline or stop services at any time by request to their care team member.   Complex Care Management Consent Status: Patient did not agree to participate in complex care management services at this time.  Encounter Outcome:  Patient Refused  Baruch Gouty [facility], [facility] Care Management Assistant Direct Dial: [phone]  Fax: [phone]

## 2024-03-15 DIAGNOSIS — F410 Panic disorder [episodic paroxysmal anxiety] without agoraphobia: Secondary | ICD-10-CM | POA: Diagnosis not present

## 2024-03-17 ENCOUNTER — Encounter (INDEPENDENT_AMBULATORY_CARE_PROVIDER_SITE_OTHER): Payer: Self-pay

## 2024-03-17 ENCOUNTER — Ambulatory Visit: Payer: Self-pay | Admitting: *Deleted

## 2024-03-18 ENCOUNTER — Other Ambulatory Visit: Payer: Self-pay | Admitting: Student

## 2024-03-18 DIAGNOSIS — F410 Panic disorder [episodic paroxysmal anxiety] without agoraphobia: Secondary | ICD-10-CM

## 2024-03-18 NOTE — Progress Notes (Signed)
 Pt wanting thyroid checked for her anxiety symptoms. Order placed and patient messaged through myChart to set up a lab appointment.

## 2024-03-21 ENCOUNTER — Ambulatory Visit (INDEPENDENT_AMBULATORY_CARE_PROVIDER_SITE_OTHER): Admitting: Student

## 2024-03-21 ENCOUNTER — Encounter: Payer: Self-pay | Admitting: Student

## 2024-03-21 VITALS — BP 106/64 | HR 67 | Ht 62.0 in | Wt 229.6 lb

## 2024-03-21 DIAGNOSIS — E038 Other specified hypothyroidism: Secondary | ICD-10-CM

## 2024-03-21 DIAGNOSIS — F411 Generalized anxiety disorder: Secondary | ICD-10-CM

## 2024-03-21 DIAGNOSIS — J302 Other seasonal allergic rhinitis: Secondary | ICD-10-CM

## 2024-03-21 MED ORDER — FEXOFENADINE HCL 60 MG PO TABS: 60.0000 mg | ORAL_TABLET | Freq: Two times a day (BID) | ORAL | 0 refills | Status: DC

## 2024-03-21 NOTE — Progress Notes (Signed)
    SUBJECTIVE:   CHIEF COMPLAINT / HPI:   The patient, with a history of anxiety, panic attacks, and acid reflux, presents with a sensation of something sitting in her throat for the past week and a half. She reports that this sensation leads to frequent throat clearing and the production of what she believes to be mucus. She also notes that she has been sneezing, suggesting possible allergies.  Regarding her anxiety and panic attacks, the patient reports that she has recently switched from Lexapro to Prozac, a change made by her counselor's nurse practitioner on April 8th. She notes that the Prozac makes her feel "jittery" and as though her heart rate and blood pressure are elevated, though these were reported as normal at her last check.   The patient's acid reflux is being managed with pantoprazole, which she takes twice daily. Despite this, she continues to experience symptoms, including a sensation of something sitting in her throat.  PERTINENT  PMH / PSH: Reviewed.  OBJECTIVE:   BP 106/64   Pulse 67   Ht 5\' 2"  (1.575 m)   Wt 229 lb 9.6 oz (104.1 kg)   LMP 02/29/2024 (Exact Date)   SpO2 100%   BMI 41.99 kg/m    Physical Exam General: Alert, morbidly obese, well-appearing, NAD Cardiovascular: RRR, No Murmurs, Normal S2/S2 Respiratory: CTAB, No wheezing or Rales Abdomen: No distension or tenderness Extremities: No edema on extremities     ASSESSMENT/PLAN:   Allergic rhinitis Known history of allergic rhinitis suspect the throat irritation and cough is possibly due to allergy rather than GERD given that patient is on adequate therapy for reflux.  Given intolerance to Zyrtec and Claritin we will prescribe Allegra which patient will take 2 times daily.  GAD (generalized anxiety disorder) The being managed with counseling and recent change to Prozac.  Given reported remittent palpitations with these episodes we will follow with thyroid etiology.  Historically chart review shows  normal heart rates in prior visits and no episodes of syncope is reassuring. - Ordered TSH levels - Encourage patient to continue with counseling and discussed management with psychiatrist NP. - Precautions discussed with patient. - Palpitations continue could consider Zio patch for long-term monitoring.     Goble Last, MD [facility]

## 2024-03-21 NOTE — Assessment & Plan Note (Signed)
 Known history of allergic rhinitis suspect the throat irritation and cough is possibly due to allergy rather than GERD given that patient is on adequate therapy for reflux.  Given intolerance to Zyrtec and Claritin we will prescribe Allegra which patient will take 2 times daily.

## 2024-03-21 NOTE — Assessment & Plan Note (Signed)
 The being managed with counseling and recent change to Prozac.  Given reported remittent palpitations with these episodes we will follow with thyroid etiology.  Historically chart review shows normal heart rates in prior visits and no episodes of syncope is reassuring. - Ordered TSH levels - Encourage patient to continue with counseling and discussed management with psychiatrist NP. - Precautions discussed with patient. - Palpitations continue could consider Zio patch for long-term monitoring.

## 2024-03-21 NOTE — Patient Instructions (Signed)
 Pleasure to see you today.  Please continue to see your counselor and-year-old psychiatrist nurse practitioner to manage your anxiety and panic attack.  Today have ordered labs to check your thyroid level.  Your heart rate and blood pressure today were normal.  Irritation in your throat with the cough I suspect this is allergy I have sent in prescription for Allegra which you will take 2 times daily.

## 2024-03-22 ENCOUNTER — Other Ambulatory Visit: Payer: Self-pay | Admitting: Student

## 2024-03-22 ENCOUNTER — Encounter: Payer: Self-pay | Admitting: Student

## 2024-03-22 LAB — TSH: TSH: 1.26 u[IU]/mL (ref 0.450–4.500)

## 2024-03-22 MED ORDER — CETIRIZINE HCL 10 MG PO TABS: 10.0000 mg | ORAL_TABLET | Freq: Every day | ORAL | 11 refills | Status: DC | PRN

## 2024-03-22 NOTE — Progress Notes (Signed)
 Pt transitioning from Lexapro to Prozac and having allergy symptoms. Patient seen in office and started on allegra. Pt ask about interaction of Prozac and allegra, there is some, that could lead to allegra lasting longer.   Will instead use zyrtec as there no intersection with Prozac.

## 2024-03-24 DIAGNOSIS — F411 Generalized anxiety disorder: Secondary | ICD-10-CM | POA: Diagnosis not present

## 2024-03-28 ENCOUNTER — Other Ambulatory Visit: Payer: Self-pay | Admitting: Gastroenterology

## 2024-03-28 ENCOUNTER — Encounter: Payer: Self-pay | Admitting: Student

## 2024-03-28 ENCOUNTER — Telehealth: Payer: Self-pay

## 2024-03-28 DIAGNOSIS — K219 Gastro-esophageal reflux disease without esophagitis: Secondary | ICD-10-CM

## 2024-03-28 MED ORDER — OMEPRAZOLE 40 MG PO CPDR: 40.0000 mg | DELAYED_RELEASE_CAPSULE | Freq: Two times a day (BID) | ORAL | 1 refills | Status: DC

## 2024-03-28 NOTE — Progress Notes (Signed)
 Switched patient to omeprazole  for uncontrolled GERD

## 2024-03-28 NOTE — Telephone Encounter (Signed)
 Patient calls nurse line requesting refills on flonase  nasal spray and albuterol  inhaler.   Flonase  is not on current medication list.   She is requesting that both refills be sent to [facility] on [address].   Please advise.   Elsie Halo, RN

## 2024-03-30 ENCOUNTER — Other Ambulatory Visit: Payer: Self-pay | Admitting: Student

## 2024-03-30 DIAGNOSIS — F411 Generalized anxiety disorder: Secondary | ICD-10-CM | POA: Diagnosis not present

## 2024-03-30 MED ORDER — FLUTICASONE PROPIONATE 50 MCG/ACT NA SUSP
2.0000 | Freq: Every day | NASAL | 6 refills | Status: DC
Start: 2024-03-30 — End: 2024-04-25

## 2024-03-30 MED ORDER — ALBUTEROL SULFATE HFA 108 (90 BASE) MCG/ACT IN AERS: INHALATION_SPRAY | RESPIRATORY_TRACT | 0 refills | Status: DC

## 2024-03-30 NOTE — Progress Notes (Signed)
 Pt request refill of albuterol  and flonaze

## 2024-03-30 NOTE — Telephone Encounter (Signed)
 Rx sent

## 2024-03-31 ENCOUNTER — Ambulatory Visit

## 2024-03-31 VITALS — BP 132/78 | HR 75 | Ht 62.0 in | Wt 233.2 lb

## 2024-03-31 DIAGNOSIS — F410 Panic disorder [episodic paroxysmal anxiety] without agoraphobia: Secondary | ICD-10-CM

## 2024-03-31 DIAGNOSIS — F411 Generalized anxiety disorder: Secondary | ICD-10-CM | POA: Diagnosis not present

## 2024-03-31 DIAGNOSIS — E871 Hypo-osmolality and hyponatremia: Secondary | ICD-10-CM

## 2024-03-31 DIAGNOSIS — R42 Dizziness and giddiness: Secondary | ICD-10-CM

## 2024-03-31 NOTE — Patient Instructions (Signed)
 It was great to see you! Thank you for allowing me to participate in your care!   Our plans for today:  -Please schedule an appointment with your PCP in 2 weeks to further discuss your anxiety - The drop in your blood pressure was borderline for orthostatic hypotension which is likely causing your dizziness.  Do the below to reduce the risk of orthostatic hypotension -avoid dehydration. Often it requires high volumes of fluids, often with salt/electrolytes included, to stay hydrated. People with orthostasis are very sensitive to fluid shifts and dehydration. Oral rehydration is preferred, and routine use of IV fluids is not recommended. -slow position changes are recommended -if there is a feeling of severe lightheadedness, like near to passing out, recommend lying on the floor on the back, with legs elevated up on a chair or up against the wall. -the best long term management is gradual exercise conditioning. I recommend seated exercises such as bike to start, to avoid the risk of falling with lightheadedness. Exercise programs, either through supervised programs like cardiac rehab or through personal programs, should focus on gradually increasing exercise tolerance and conditioning.    We are checking some labs today, I will call you if they are abnormal will send you a MyChart message or a letter if they are normal.  If you do not hear about your labs in the next 2 weeks please let us  know.  Take care and seek immediate care sooner if you develop any concerns.   Dr. Glenn Lange, DO [facility]

## 2024-03-31 NOTE — Progress Notes (Unsigned)
    SUBJECTIVE:   CHIEF COMPLAINT / HPI:   The patient presents with concerns about their current medications and overall health. They report experiencing anxiety and panic attacks since March, which have been partially alleviated since starting Prozac  two weeks ago. They also report mild lightheadedness upon standing which began around the same time as the panic attacks. The patient expresses concerns about their vitamin levels and electrolyte balance as sodium was slightly low at 133 in the ED recently.  She wonders if she can get her vitamin levels checked as she was told by friends that her vitamin levels could be causing worsening anxiety.  She is currently following with psychiatry who is prescribing her medications and undergoing therapy for anxiety.  PERTINENT  PMH / PSH:  anxiety  OBJECTIVE:   BP 132/78   Pulse 75   Ht 5\' 2"  (1.575 m)   Wt 233 lb 3.2 oz (105.8 kg)   LMP 02/29/2024 (Exact Date)   SpO2 100%   BMI 42.65 kg/m    General: NAD, well-appearing  Cardiac: RRR, no murmurs. Respiratory: CTAB, normal effort, No wheezes, rales or rhonchi Skin: warm and dry, no rashes noted Neuro: alert, cranial nerves II through XII intact, sensation intact, finger-nose-finger test normal Psych: Anxious appearing, rapidly switching from 1 health topic to another  ASSESSMENT/PLAN:   Panic attacks Recent onset of panic attacks over the past month. Slight improvement on Prozac  noted but has only been on it for 2 weeks.  She seems to have a lot of anxiety centered around her health, regular PCP visits may be helpful to alleviate that. She was reassured that we do not typically check for vitamin deficiencies unless there is an indication to check certain ones.  -return for PCP visit in about 2 weeks, continue to follow with psychiatry  Light headedness She is close to diagnostic threshold for orthostatic hypotension with 9 point drop in diastolic pressure.  Neuroexam is reassuring.  As  lightheadedness occurs only with standing, I suspect it is orthostatic in nature. CBC checked earlier this month and she was not anemic - Check electrolytes for abnormalities with BMP - Advise on slow position changes to manage symptoms. - Encourage adequate hydration. - PCP follow-up in about 2 weeks to monitor symptoms - Advise on gradual exercise conditioning.     Dr. Glenn Lange, DO [facility]

## 2024-04-01 ENCOUNTER — Other Ambulatory Visit: Payer: Self-pay | Admitting: *Deleted

## 2024-04-01 ENCOUNTER — Other Ambulatory Visit

## 2024-04-01 DIAGNOSIS — E871 Hypo-osmolality and hyponatremia: Secondary | ICD-10-CM

## 2024-04-01 DIAGNOSIS — R42 Dizziness and giddiness: Secondary | ICD-10-CM | POA: Diagnosis not present

## 2024-04-01 DIAGNOSIS — F410 Panic disorder [episodic paroxysmal anxiety] without agoraphobia: Secondary | ICD-10-CM

## 2024-04-01 NOTE — Assessment & Plan Note (Addendum)
 She is close to diagnostic threshold for orthostatic hypotension with 9 point drop in diastolic pressure.  Neuroexam is reassuring.  As lightheadedness occurs only with standing, I suspect it is orthostatic in nature. CBC checked earlier this month and she was not anemic - Check electrolytes for abnormalities with BMP - Advise on slow position changes to manage symptoms. - Encourage adequate hydration. - PCP follow-up in about 2 weeks to monitor symptoms - Advise on gradual exercise conditioning.

## 2024-04-01 NOTE — Assessment & Plan Note (Addendum)
 Recent onset of panic attacks over the past month. Slight improvement on Prozac  noted but has only been on it for 2 weeks.  We discussed it may take a couple more weeks before it is fully effective. She seems to have a lot of anxiety centered around her health, regular PCP visits may be helpful to alleviate that. She was reassured that we do not typically check for vitamin deficiencies unless there is an indication to check certain ones.  -return for PCP visit in about 2 weeks, continue to follow with psychiatry

## 2024-04-02 ENCOUNTER — Encounter: Payer: Self-pay | Admitting: Student

## 2024-04-02 LAB — BASIC METABOLIC PANEL WITH GFR
BUN/Creatinine Ratio: 10 (ref 9–23)
BUN: 11 mg/dL (ref 6–20)
CO2: 20 mmol/L (ref 20–29)
Calcium: 9.1 mg/dL (ref 8.7–10.2)
Chloride: 101 mmol/L (ref 96–106)
Creatinine, Ser: 1.12 mg/dL — ABNORMAL HIGH (ref 0.57–1.00)
Glucose: 101 mg/dL — ABNORMAL HIGH (ref 70–99)
Potassium: 3.9 mmol/L (ref 3.5–5.2)
Sodium: 136 mmol/L (ref 134–144)
eGFR: 67 mL/min/{1.73_m2} (ref >59)

## 2024-04-02 LAB — TSH RFX ON ABNORMAL TO FREE T4: TSH: 1.45 u[IU]/mL (ref 0.450–4.500)

## 2024-04-04 ENCOUNTER — Encounter (HOSPITAL_COMMUNITY): Payer: Self-pay | Admitting: Emergency Medicine

## 2024-04-04 ENCOUNTER — Emergency Department (HOSPITAL_COMMUNITY)
Admission: EM | Admit: 2024-04-04 | Discharge: 2024-04-04 | Disposition: A | Discharge status: Home / Self Care | Attending: Emergency Medicine | Admitting: Emergency Medicine

## 2024-04-04 ENCOUNTER — Other Ambulatory Visit: Payer: Self-pay

## 2024-04-04 DIAGNOSIS — R42 Dizziness and giddiness: Secondary | ICD-10-CM | POA: Diagnosis not present

## 2024-04-04 LAB — CBC WITH DIFFERENTIAL/PLATELET
Abs Immature Granulocytes: 0.02 10*3/uL (ref 0.00–0.07)
Basophils Absolute: 0 10*3/uL (ref 0.0–0.1)
Basophils Relative: 0 %
Eosinophils Absolute: 0.1 10*3/uL (ref 0.0–0.5)
Eosinophils Relative: 1 %
HCT: 38 % (ref 36.0–46.0)
Hemoglobin: 12.6 g/dL (ref 12.0–15.0)
Immature Granulocytes: 0 %
Lymphocytes Relative: 30 %
Lymphs Abs: 1.9 10*3/uL (ref 0.7–4.0)
MCH: 28.4 pg (ref 26.0–34.0)
MCHC: 33.2 g/dL (ref 30.0–36.0)
MCV: 85.6 fL (ref 80.0–100.0)
Monocytes Absolute: 0.4 10*3/uL (ref 0.1–1.0)
Monocytes Relative: 7 %
Neutro Abs: 4 10*3/uL (ref 1.7–7.7)
Neutrophils Relative %: 62 %
Platelets: 283 10*3/uL (ref 150–400)
RBC: 4.44 MIL/uL (ref 3.87–5.11)
RDW: 13.6 % (ref 11.5–15.5)
WBC: 6.5 10*3/uL (ref 4.0–10.5)
nRBC: 0 % (ref 0.0–0.2)

## 2024-04-04 LAB — BASIC METABOLIC PANEL WITH GFR
Anion gap: 8 (ref 5–15)
BUN: 8 mg/dL (ref 6–20)
CO2: 22 mmol/L (ref 22–32)
Calcium: 8.8 mg/dL — ABNORMAL LOW (ref 8.9–10.3)
Chloride: 104 mmol/L (ref 98–111)
Creatinine, Ser: 1.03 mg/dL — ABNORMAL HIGH (ref 0.44–1.00)
GFR, Estimated: >60 mL/min (ref >60)
Glucose, Bld: 96 mg/dL (ref 70–99)
Potassium: 3.4 mmol/L — ABNORMAL LOW (ref 3.5–5.1)
Sodium: 134 mmol/L — ABNORMAL LOW (ref 135–145)

## 2024-04-04 LAB — URINALYSIS, ROUTINE W REFLEX MICROSCOPIC
Bilirubin Urine: NEGATIVE
Glucose, UA: NEGATIVE mg/dL
Hgb urine dipstick: NEGATIVE
Ketones, ur: NEGATIVE mg/dL
Leukocytes,Ua: NEGATIVE
Nitrite: NEGATIVE
Protein, ur: NEGATIVE mg/dL
Specific Gravity, Urine: 1.002 — ABNORMAL LOW (ref 1.005–1.030)
pH: 7 (ref 5.0–8.0)

## 2024-04-04 LAB — PREGNANCY, URINE: Preg Test, Ur: NEGATIVE

## 2024-04-04 NOTE — ED Provider Notes (Signed)
 [place] EMERGENCY DEPARTMENT AT [facility] Provider Note   CSN: [id number] Arrival date & time: 04/04/24  8469     History  No chief complaint on file.   Veronica [name] is a 33 y.o. female history of anxiety, panic attacks, GERD presented for lightheadedness when she stands up.  Patient was seen at her primary care provider and states that she was borderline orthostatic but still continues to have symptoms.  Patient denies LOC, blood thinners, chest pain or shortness of breath weakness.  Patient states symptoms get better after a moment of standing.  Patient denies any fevers, dysuria, abdominal pain.    Home Medications Prior to Admission medications   Medication Sig Start Date End Date Taking? Authorizing Provider  omeprazole  (PRILOSEC) 40 MG capsule Take 1 capsule (40 mg total) by mouth 2 (two) times daily before a meal. 03/28/24   May, Deanna J, NP  albuterol  (VENTOLIN  HFA) 108 (90 Base) MCG/ACT inhaler INHALE 2 PUFFS BY MOUTH EVERY 4 HOURS AS NEEDED FOR WHEEZING OR SHORTNESS OF BREATH (COUGH). 03/30/24   Wilhemena Harbour, MD  Blood Pressure KIT Please use to check blood pressure as needed. 07/15/23   Azell Boll, MD  cetirizine  (ZYRTEC ) 10 MG tablet Take 1 tablet (10 mg total) by mouth daily as needed for allergies. 03/22/24   Wilhemena Harbour, MD  doxepin  (SINEQUAN ) 10 MG capsule Take 1 capsule (10 mg total) by mouth at bedtime. 03/08/24   Sowell, Brandon, MD  escitalopram  (LEXAPRO ) 10 MG tablet Take 1 tablet (10 mg total) by mouth daily. 03/03/24   Jonne Netters, MD  etonogestrel  (NEXPLANON ) 68 MG IMPL implant 1 each by Subdermal route once.    [provider]  ferrous sulfate  (FEROSUL) 325 (65 FE) MG tablet Take 1 tablet (325 mg total) by mouth every other day. 03/04/24   Wilhemena Harbour, MD  fexofenadine  (ALLEGRA ) 60 MG tablet Take 1 tablet (60 mg total) by mouth 2 (two) times daily. 03/21/24   Goble Last, MD  FLUoxetine  (PROZAC ) 20 MG tablet Take 20 mg by  mouth daily.    [provider]  fluticasone  (FLONASE ) 50 MCG/ACT nasal spray Place 2 sprays into both nostrils daily. 03/30/24   Sowell, Brandon, MD  fluticasone  (FLOVENT  HFA) 110 MCG/ACT inhaler Inhale 2 puffs into the lungs 2 (two) times daily. 11/13/23   Marveen Slick, MD  hydroxypropyl methylcellulose / hypromellose (ISOPTO TEARS / GONIOVISC) 2.5 % ophthalmic solution Place 1 drop into both eyes 4 (four) times daily as needed for dry eyes. 10/24/23   Raspet, Erin K, [name]  lidocaine  (XYLOCAINE ) 2 % solution Use as directed 15 mLs in the mouth or throat as needed for mouth pain. 12/27/23   Levora Reas A, NP  LORazepam  (ATIVAN ) 1 MG tablet Take 1 tablet (1 mg total) by mouth 2 (two) times daily as needed for anxiety (panice attacks). 02/13/24   Tegeler, Marine Sia, MD  polyethylene glycol (MIRALAX  / GLYCOLAX ) 17 g packet Take 17 g by mouth 2 (two) times daily. 05/15/23   Santana Cue, MD  senna (SENOKOT) 8.6 MG TABS tablet Take 1 tablet (8.6 mg total) by mouth 2 (two) times daily. 05/15/23   Santana Cue, MD  trimethoprim -polymyxin b  (POLYTRIM ) ophthalmic solution Place 1 drop into the left eye every 6 (six) hours. 10/24/23   Raspet, Erin K, [name]      Allergies    Morphine , Ibuprofen , and Morphine  and codeine     Review of Systems  Review of Systems  Physical Exam Updated Vital Signs BP 121/70   Pulse 79   Temp 98 F (36.7 C) (Oral)   Resp 18   Ht 5\' 2"  (1.575 m)   Wt 104.3 kg   LMP 02/29/2024 (Exact Date)   SpO2 97%   BMI 42.07 kg/m  Physical Exam Vitals reviewed.  Constitutional:      General: She is not in acute distress.    Comments: Gets lightheaded when I ask her to stand and heart rate does jump to 113 in the room  HENT:     Head: Normocephalic and atraumatic.  Eyes:     Extraocular Movements: Extraocular movements intact.     Conjunctiva/sclera: Conjunctivae normal.     Pupils: Pupils are equal, round, and reactive to light.   Cardiovascular:     Rate and Rhythm: Normal rate and regular rhythm.     Pulses: Normal pulses.     Heart sounds: Normal heart sounds.     Comments: 2+ bilateral radial/dorsalis pedis pulses with regular rate Pulmonary:     Effort: Pulmonary effort is normal. No respiratory distress.     Breath sounds: Normal breath sounds.  Abdominal:     Palpations: Abdomen is soft.     Tenderness: There is no abdominal tenderness. There is no guarding or rebound.  Musculoskeletal:        General: Normal range of motion.     Cervical back: Normal range of motion and neck supple.     Comments: 5 out of 5 bilateral grip/leg extension strength  Skin:    General: Skin is warm and dry.     Capillary Refill: Capillary refill takes less than 2 seconds.  Neurological:     General: No focal deficit present.     Mental Status: She is alert and oriented to person, place, and time.     Sensory: Sensation is intact.     Motor: Motor function is intact.     Coordination: Coordination is intact.     Gait: Gait is intact.     Comments: Sensation intact in all 4 limbs Cranial nerves III through XII intact Vision grossly intact  Psychiatric:        Mood and Affect: Mood normal.     ED Results / Procedures / Treatments   Labs (all labs ordered are listed, but only abnormal results are displayed) Labs Reviewed  BASIC METABOLIC PANEL WITH GFR - Abnormal; Notable for the following components:      Result Value   Sodium 134 (*)    Potassium 3.4 (*)    Creatinine, Ser 1.03 (*)    Calcium  8.8 (*)    All other components within normal limits  URINALYSIS, ROUTINE W REFLEX MICROSCOPIC - Abnormal; Notable for the following components:   APPearance CLOUDY (*)    Specific Gravity, Urine 1.002 (*)    All other components within normal limits  CBC WITH DIFFERENTIAL/PLATELET  PREGNANCY, URINE    EKG EKG Interpretation Date/Time:  Monday April 04 2024 09:06:05 EDT Ventricular Rate:  89 PR Interval:  186 QRS  Duration:  88 QT Interval:  355 QTC Calculation: 432 R Axis:   66  Text Interpretation: Sinus rhythm Borderline T abnormalities, anterior leads Confirmed by Jerald Molly [phone]) on 04/04/2024 9:37:18 AM  Radiology No results found.  Procedures Procedures    Medications Ordered in ED Medications - No data to display  ED Course/ Medical Decision Making/ A&P  Medical Decision Making Amount and/or Complexity of Data Reviewed Labs: ordered.   Kiandria R Rhyne 33 y.o. presented today for lightheaded. Working DDx that I considered at this time includes, but not limited to, orthostatic, anemia, electrolyte abnormalities, UTI.  R/o DDx: anemia, electrolyte abnormalities, UTI: These are considered less likely due to history of present illness, physical exam, labs/imaging findings  Review of prior external notes: 09/26/2022 ED  Unique Tests and My Independent Interpretation:  CBC: Unremarkable BMP: Unremarkable Urine pregnancy: Negative UA: Unremarkable EKG: Sinus 89 bpm, no ST elevations or depressions noted, no overt signs of right heart strain  Social Determinants of Health: none  Discussion with Independent Historian: None  Discussion of Management of Tests: None  Risk: Low: based on diagnostic testing/clinical impression and treatment plan  Risk Stratification Score: None  Plan: On exam patient was no acute distress stable vitals.  Patient's neuroexam was reassuring however when I asked her to stand she became lightheaded and heart rate went from 98 on the monitor to 113.  I do suspect patient has orthostatic hypotension she was borderline in the office when she went to go see her primary care provider.  Will obtain blood work and urine to rule out other causes but I did discuss with the patient at length lifestyle changes and to go from a lying to standing position slowly to help decrease her symptoms which she verbalized understanding  and acceptance of.  Orthostatic show 13 point difference when going from a lying to standing position for heart rate.  As patient had 15 point difference earlier with me in the room I do feel patient does have some mild orthostatic hypotension causing her symptoms in the setting of no neurologic deficits or paresthesias.  Will get urine to rule out UTI or pregnancy causing patient's symptoms but do feel patient will be safe to be discharged and follow-up with her primary care provider to which patient agrees with.  Labs are ultimately reassuring.  Patient is able to ambulate without difficulty and has no neurologic deficits that would require further imaging at this time.  Will discharge primary care follow-up.  Patient was given return precautions. Patient stable for discharge at this time.  Patient verbalized understanding of plan.  This chart was dictated using voice recognition software.  Despite best efforts to proofread,  errors can occur which can change the documentation meaning.         Final Clinical Impression(s) / ED Diagnoses Final diagnoses:  Lightheaded    Rx / DC Orders ED Discharge Orders     None         Elex Grimmer 04/04/24 1231    Arvilla Birmingham, MD 04/04/24 [phone]

## 2024-04-04 NOTE — ED Triage Notes (Addendum)
 Pt came in for lightheadedness/dizziness x 1 week.  Pt does not endorse any pain or other neuro deficits at this time. Pt does endorse a change to her anxiety medication on 4/9 with hx of anxiety and panic attacks.  Pt is calm and does not appear anxious at this time.

## 2024-04-04 NOTE — Discharge Instructions (Signed)
 Please follow-up your primary care provider in regards to recent symptoms and ER visit.  Today your labs are all reassuring and you most likely have orthostatic hypotension causing your symptoms.  Please remain hydrated and when you go from a lying to a standing position please go slowly.  If symptoms change or worsen please return to the ER.

## 2024-04-04 NOTE — ED Notes (Signed)
Pt able to ambulate to RR

## 2024-04-05 ENCOUNTER — Encounter: Payer: Self-pay | Admitting: Student

## 2024-04-06 NOTE — Progress Notes (Signed)
   SUBJECTIVE:   CHIEF COMPLAINT / HPI: ED follow up  Patient was seen in the ED for lightheadedness. She reports this primarily from lying to standing, there was concern for orthostatic hypotension. She reports that the lightheadedness is positional and that started after starting Prozac  and she endorses some blurry vision. Diet consists of low sodium and isn't very regular (sometimes eats 2x vs 3x/ day).  Acid Reflux Switched from Pantoprazole  to Omeprazole  recently but that caused her to start having headaches, so she has continued to the take the Pantoprazole .    PERTINENT  PMH / PSH: anxiety, panic attacks, hiatal hernia  OBJECTIVE:   BP 111/75   Pulse 79   Ht 5\' 2"  (1.575 m)   Wt 229 lb (103.9 kg)   LMP 02/29/2024 (Exact Date)   SpO2 98%   BMI 41.88 kg/m   General: Awake and Alert in NAD HEENT: NCAT. Sclera anicteric. No rhinorrhea. Cardiovascular: RRR. No M/R/G Respiratory: CTAB, normal WOB on RA. No wheezing, crackles, rhonchi, or diminished breath sounds. Abdomen: Soft, non-tender, non-distended. Bowel sounds normoactive Extremities: Able to move all extremities. No BLE edema, no deformities or significant joint findings. Skin: Warm and dry. No abrasions or rashes noted. Neuro: No focal neurological deficits.  ASSESSMENT/PLAN:   Assessment & Plan Lightheadedness S/p ED visit for lightheadedness and previous office visit for same situation. She was borderline for orthostatics at that time. Electrolytes and CBC were recently checked and were stable, reassurance was provided. - Orthostatic VS repeated today, which did not meet criteria - Slow position changes to manage symptoms - Diet education and proper hydration recommended - Advised her to discuss medications changes with her psychiatrist - Reassurance provided that her BP is stable today and these dietary modifications could impact her health  Clyda Dark, DO [facility]

## 2024-04-07 ENCOUNTER — Ambulatory Visit: Payer: Self-pay | Admitting: Adult Health

## 2024-04-07 ENCOUNTER — Other Ambulatory Visit: Payer: Self-pay

## 2024-04-07 ENCOUNTER — Encounter: Payer: Self-pay | Admitting: Family Medicine

## 2024-04-07 ENCOUNTER — Ambulatory Visit: Admitting: Family Medicine

## 2024-04-07 VITALS — BP 111/75 | HR 79 | Ht 62.0 in | Wt 229.0 lb

## 2024-04-07 DIAGNOSIS — R42 Dizziness and giddiness: Secondary | ICD-10-CM | POA: Diagnosis present

## 2024-04-07 DIAGNOSIS — F411 Generalized anxiety disorder: Secondary | ICD-10-CM | POA: Diagnosis not present

## 2024-04-07 MED ORDER — PANTOPRAZOLE SODIUM 40 MG PO TBEC: DELAYED_RELEASE_TABLET | ORAL | 0 refills | Status: DC

## 2024-04-07 NOTE — Patient Instructions (Addendum)
 It was great to see you today! Thank you for choosing [facility] for your primary care. Veronica [name] was seen for ED follow up.  Today we addressed: Lightheadedness We checked your orthostatic vital signs which were normal and didn't meet orthostatic criteria today. Recommend slow position changes to manage symptoms. Make sure that you are eating a regular diet consistently (3 meals a day with snacks if needed) and making sure you stay hydrated. Since not eating at the same time/consistent meals can cause your blood sugar to drop and make you feel lightheaded as well. Your Prozac  could play a role in orthostatic hypotension (your blood pressure getting low due to positional changes).  I would discuss making changes with your psychiatrist if this continues  2. Continue your pantoprazole , but think about making these changes as well Lifestyle modifications for heart burn: Eating smaller, more frequent meals Avoiding spicy/acidic foods, chocolate, caffeine, and alcohol Avoid lying down after eating, wait at least 2-3 hours Elevate your head in bed Maintain a healthy weight  You should return to our clinic No follow-ups on file. Please arrive 15 minutes before your appointment to ensure smooth check in process.  We appreciate your efforts in making this happen.  Thank you for allowing me to participate in your care, Clyda Dark, DO 04/07/2024, 4:22 PM PGY-1, [facility]

## 2024-04-07 NOTE — Telephone Encounter (Addendum)
 Chief Complaint: mild SOB Symptoms: panic attack, GERD Frequency: x 1 month Pertinent Negatives: Patient denies fever, CP, severe SOB Disposition: [] ED /[] Urgent Care (no appt availability in office) / [x] Appointment(In office/virtual)/ []  [place] [facility]/ [] Home Care/ [] Refused Recommended Disposition /[] [place] [facility]/ []  Follow-up with PCP Additional Notes: Pt c/o mild SOB, panic attacks and GERD. Pt unsure if SOB r/t panic attacks and would like to rule out pulmonary origin. Pt reports that she was recently seen in ED last week for similar sx and discharge dx was panic attacks. Triager does not appreciate any audible SOB/wheezing during call, and pt speaking in full sentences. Pt reports taking respiratory INH not as prescribed, and has albuterol . Triager reinforced albuterol  usage. Triager attempted to schedule per protocol, but pt had to disconnect prematurely. Triager will forward encounter for Tammy, NP 's office to review and schedule. Patient verbalized understanding and is expecting call back from office to schedule.    Copied from CRM [id number]. Topic: Clinical - Red Word Triage >> Apr 07, 2024 12:54 PM Veronica [name] wrote: Red Word that prompted transfer to Nurse Triage: SOB/panic attacks Reason for Disposition  [1] MILD longstanding difficulty breathing AND [2]  SAME as normal  Answer Assessment - Initial Assessment Questions E2C2 Pulmonary Triage - Initial Assessment Questions "Chief Complaint (e.g., cough, sob, wheezing, fever, chills, sweat or additional symptoms) *Go to specific symptom protocol after initial questions. SOB Unknown if r/t panic attacks  "How long have symptoms been present?" Last month  Have you tested for COVID or Flu? Note: If not, ask patient if a home test can be taken. If so, instruct patient to call back for positive results. No  MEDICINES:   "Have you used any OTC meds to help with symptoms?" Yes If yes, ask "What medications?" Endorses  taking allergy meds  "Have you used your inhalers/maintenance medication?" Yes If yes, "What medications?" Flovent   - 2 puffs twice daily Albuterol  - has not used in a while  If inhaler, ask "How many puffs and how often?" Note: Review instructions on medication in the chart. See above  OXYGEN: "Do you wear supplemental oxygen?" No If yes, "How many liters are you supposed to use?" N/a  "Do you monitor your oxygen levels?" Yes If yes, "What is your reading (oxygen level) today?" Reports did not check today, yesterday was 98  "What is your usual oxygen saturation reading?"  (Note: Pulmonary O2 sats should be 90% or greater) High 90s   3. PATTERN "Does the difficult breathing come and go, or has it been constant since it started?"      intermittent 4. SEVERITY: "How bad is your breathing?" (e.g., mild, moderate, severe)    - MILD: No SOB at rest, mild SOB with walking, speaks normally in sentences, can lie down, no retractions, pulse < 100.    - MODERATE: SOB at rest, SOB with minimal exertion and prefers to sit, cannot lie down flat, speaks in phrases, mild retractions, audible wheezing, pulse 100-120.    - SEVERE: Very SOB at rest, speaks in single words, struggling to breathe, sitting hunched forward, retractions, pulse > 120      Mild - SOB with exertion 5. RECURRENT SYMPTOM: "Have you had difficulty breathing before?" If Yes, ask: "When was the last time?" and "What happened that time?"      no 6. CARDIAC HISTORY: "Do you have any history of heart disease?" (e.g., heart attack, angina, bypass surgery, angioplasty)      denies  7. LUNG HISTORY: "Do you have any history of lung disease?"  (e.g., pulmonary embolus, asthma, emphysema)     asthma 8. CAUSE: "What do you think is causing the breathing problem?"      Asthma vs panic attacks 9. OTHER SYMPTOMS: "Do you have any other symptoms? (e.g., dizziness, runny nose, cough, chest pain, fever)     Denies Endorses  anxiety  Protocols used: Breathing Difficulty-A-AH

## 2024-04-07 NOTE — Telephone Encounter (Signed)
 I have held Dr Reine Caraway 05/13/24 at 10:15 slot  Called the pt to get her scheduled for acute visit- there was no answer- LMTCB

## 2024-04-11 NOTE — Telephone Encounter (Signed)
 I will route to the front office to try and have pt scheduled.

## 2024-04-12 ENCOUNTER — Encounter: Payer: Self-pay | Admitting: Pulmonary Disease

## 2024-04-12 ENCOUNTER — Ambulatory Visit (INDEPENDENT_AMBULATORY_CARE_PROVIDER_SITE_OTHER): Admitting: Pulmonary Disease

## 2024-04-12 VITALS — BP 110/79 | Ht 62.0 in | Wt 227.6 lb

## 2024-04-12 DIAGNOSIS — J4520 Mild intermittent asthma, uncomplicated: Secondary | ICD-10-CM | POA: Diagnosis not present

## 2024-04-12 MED ORDER — FLUTICASONE-SALMETEROL 115-21 MCG/ACT IN AERO: 2.0000 | INHALATION_SPRAY | Freq: Two times a day (BID) | RESPIRATORY_TRACT | 12 refills | Status: AC

## 2024-04-12 NOTE — Progress Notes (Signed)
 Synopsis: Hx of asthma  Subjective:   PATIENT ID: Veronica Oiler R [name] GENDER: female DOB: [birth year], MRN: [id number]   HPI  Chief Complaint  Patient presents with   Acute Visit    Pt states after eating SOB    Veronica [name] is a 33 year old, never smoker with anxiety, GERD and mild intermittent asthma who returns to pulmonary clinic for acute visit due to shortness of breath after eating.  She experiences shortness of breath after eating, regardless of meal size, with a sensation of food being stuck in her throat, especially after consuming waffles and eggs. Drinking water  alleviates this sensation. She has asthma and uses an albuterol  inhaler. She has not been using her Flovent  inhaler regularly due to concerns about worsening her breathing. Shortness of breath occurs even with small snacks and is sometimes accompanied by bloating. She has lost significant weight, from 320 lbs to 227 lbs, and noticed an improvement in her breathing during weight loss, though symptoms have recently returned.  She has a history of GERD and hiatal hernia, with a previous EGD showing no other abnormalities. She takes pantoprazole  for GERD. She experiences a 'Globus sensation' in her throat, which she associates with anxiety. She takes fluoxetine , clonazepam, and hydroxyzine  for anxiety, and Zyrtec  for allergies. She reports dry mouth and throat, and frequent throat clearing with mucus production. She attempted to walk at the park recently but had to stop due to increased shortness of breath.  Past Medical History:  Diagnosis Date   Abdominal pain 01/06/2012   Acute tension headache 07/27/2019   Anemia    Ankle pain    Anxiety    Asthma    Birth control counseling 08/26/2011   BMI 40.0-44.9, adult (HCC) 02/27/2011   Dysuria 01/13/2013   Epigastric pain 09/13/2019   GERD (gastroesophageal reflux disease)    Heart murmur    Hiatal hernia    Obesity (BMI 30-39.9)    Pre-eclampsia    off med since 4/23    Secondary amenorrhea 03/25/2018   Trichomonas infection 06/17/2022     Family History  Problem Relation Age of Onset   Diabetes Mother    Hypertension Mother    Obesity Mother    Heart attack Mother 15       Death   Other Sister        per-diabetic   Esophageal cancer Neg Hx    Colon cancer Neg Hx    Liver disease Neg Hx      Social History   Socioeconomic History   Marital status: Significant Other    Spouse name: Not on file   Number of children: 2   Years of education: Not on file   Highest education level: Not on file  Occupational History   Occupation: home health care  Tobacco Use   Smoking status: Never   Smokeless tobacco: Never  Vaping Use   Vaping status: Never Used  Substance and Sexual Activity   Alcohol use: Not Currently   Drug use: Not Currently    Comment: last used in teens   Sexual activity: Yes    Partners: Male    Birth control/protection: Implant  Other Topics Concern   Not on file  Social History Narrative   Lives son ([name] 09/02/2010 ), sister Irena Manners Pike 10/28/94), and mom (Mary Richert-Gillis) and mom's fiance.    Attending [employer] to obtain GED.   Mom's fiance smokes outside house.    Pet: dog  Social Drivers of Corporate investment banker Strain: Low Risk  (01/29/2022)   Overall Financial Resource Strain (CARDIA)    Difficulty of Paying Living Expenses: Not hard at all  Food Insecurity: No Food Insecurity (08/13/2021)   Hunger Vital Sign    Worried About Running Out of Food in the Last Year: Never true    Ran Out of Food in the Last Year: Never true  Transportation Needs: No Transportation Needs (08/13/2021)   PRAPARE - Administrator, Civil Service (Medical): No    Lack of Transportation (Non-Medical): No  Physical Activity: Sufficiently Active (01/29/2022)   Exercise Vital Sign    Days of Exercise per Week: 3 days    Minutes of Exercise per Session: 60 min  Stress: Stress Concern Present  (01/29/2022)   [employer] of Occupational Health - Occupational Stress Questionnaire    Feeling of Stress : Very much  Social Connections: Socially Integrated (01/29/2022)   Social Connection and Isolation Panel [NHANES]    Frequency of Communication with Friends and Family: Three times a week    Frequency of Social Gatherings with Friends and Family: Twice a week    Attends Religious Services: More than 4 times per year    Active Member of [employer] or Organizations: Yes    Attends Engineer, structural: More than 4 times per year    Marital Status: Living with partner  Intimate Partner Violence: Not At Risk (01/29/2022)   Humiliation, Afraid, Rape, and Kick questionnaire    Fear of Current or Ex-Partner: No    Emotionally Abused: No    Physically Abused: No    Sexually Abused: No     Allergies  Allergen Reactions   Morphine  Anaphylaxis   Ibuprofen  Nausea And Vomiting    Was told to not take this because it would flare stomach issues   Morphine  And Codeine  Other (See Comments)    Causes "chest to be heavy"     Outpatient Medications Prior to Visit  Medication Sig Dispense Refill   albuterol  (VENTOLIN  HFA) 108 (90 Base) MCG/ACT inhaler INHALE 2 PUFFS BY MOUTH EVERY 4 HOURS AS NEEDED FOR WHEEZING OR SHORTNESS OF BREATH (COUGH). 18 g 0   Blood Pressure KIT Please use to check blood pressure as needed. 1 kit 0   cetirizine  (ZYRTEC ) 10 MG tablet Take 1 tablet (10 mg total) by mouth daily as needed for allergies. 30 tablet 11   clonazePAM (KLONOPIN) 1 MG tablet Take 1 mg by mouth daily as needed for anxiety.     etonogestrel  (NEXPLANON ) 68 MG IMPL implant 1 each by Subdermal route once.     ferrous sulfate  (FEROSUL) 325 (65 FE) MG tablet Take 1 tablet (325 mg total) by mouth every other day. 30 tablet 2   FLUoxetine  (PROZAC ) 20 MG tablet Take 20 mg by mouth daily.     fluticasone  (FLONASE ) 50 MCG/ACT nasal spray Place 2 sprays into both nostrils daily. 16 g 6   hydroxypropyl  methylcellulose / hypromellose (ISOPTO TEARS / GONIOVISC) 2.5 % ophthalmic solution Place 1 drop into both eyes 4 (four) times daily as needed for dry eyes. 15 mL 0   hydrOXYzine  (ATARAX ) 10 MG tablet Take 10 mg by mouth.     pantoprazole  (PROTONIX ) 40 MG tablet Take 40 mg by mouth 2 (two) times daily.     polyethylene glycol (MIRALAX  / GLYCOLAX ) 17 g packet Take 17 g by mouth 2 (two) times daily. 14 each 1  senna (SENOKOT) 8.6 MG TABS tablet Take 1 tablet (8.6 mg total) by mouth 2 (two) times daily. 120 tablet 0   trimethoprim -polymyxin b  (POLYTRIM ) ophthalmic solution Place 1 drop into the left eye every 6 (six) hours. 10 mL 0   fluticasone  (FLOVENT  HFA) 110 MCG/ACT inhaler Inhale 2 puffs into the lungs 2 (two) times daily. 1 each 1   No facility-administered medications prior to visit.    Review of Systems  Constitutional:  Negative for chills, fever, malaise/fatigue and weight loss.  HENT:  Negative for congestion, sinus pain and sore throat.   Eyes: Negative.   Respiratory:  Positive for shortness of breath and wheezing. Negative for cough, hemoptysis and sputum production.   Cardiovascular:  Negative for chest pain, palpitations, orthopnea, claudication and leg swelling.  Gastrointestinal:  Positive for heartburn. Negative for abdominal pain, nausea and vomiting.  Genitourinary: Negative.   Musculoskeletal:  Negative for joint pain and myalgias.  Skin:  Negative for rash.  Neurological:  Negative for weakness.  Endo/Heme/Allergies: Negative.   Psychiatric/Behavioral: Negative.     Objective:   Vitals:   04/12/24 1006  BP: 110/79  SpO2: 98%  Weight: 227 lb 9.6 oz (103.2 kg)  Height: 5\' 2"  (1.575 m)    Physical Exam Constitutional:      General: She is not in acute distress.    Appearance: Normal appearance. She is obese.  Eyes:     General: No scleral icterus.    Conjunctiva/sclera: Conjunctivae normal.  Cardiovascular:     Rate and Rhythm: Normal rate and regular  rhythm.  Pulmonary:     Breath sounds: No wheezing, rhonchi or rales.  Musculoskeletal:     Right lower leg: No edema.     Left lower leg: No edema.  Skin:    General: Skin is warm and dry.  Neurological:     General: No focal deficit present.     CBC    Component Value Date/Time   WBC 6.5 04/04/2024 0930   RBC 4.44 04/04/2024 0930   HGB 12.6 04/04/2024 0930   HGB 13.1 01/08/2024 1620   HGB 10.7 (L) 03/16/2013 1413   HCT 38.0 04/04/2024 0930   HCT 39.6 01/08/2024 1620   HCT 32.4 (L) 03/16/2013 1413   PLT 283 04/04/2024 0930   PLT 324 01/08/2024 1620   MCV 85.6 04/04/2024 0930   MCV 86 01/08/2024 1620   MCV 75.3 (L) 03/16/2013 1413   MCH 28.4 04/04/2024 0930   MCHC 33.2 04/04/2024 0930   RDW 13.6 04/04/2024 0930   RDW 13.0 01/08/2024 1620   RDW 16.0 (H) 03/16/2013 1413   LYMPHSABS 1.9 04/04/2024 0930   LYMPHSABS 2.0 03/25/2022 1523   LYMPHSABS 3.2 03/16/2013 1413   MONOABS 0.4 04/04/2024 0930   MONOABS 0.3 03/16/2013 1413   EOSABS 0.1 04/04/2024 0930   EOSABS 0.0 03/25/2022 1523   BASOSABS 0.0 04/04/2024 0930   BASOSABS 0.1 03/25/2022 1523   BASOSABS 0.0 03/16/2013 1413      Latest Ref Rng & Units 04/04/2024    9:30 AM 04/01/2024   11:04 AM 03/09/2024    9:40 AM  BMP  Glucose 70 - 99 mg/dL 96  119  93   BUN 6 - 20 mg/dL 8  11  13    Creatinine 0.44 - 1.00 mg/dL 1.47  8.29  5.62   BUN/Creat Ratio 9 - 23  10    Sodium 135 - 145 mmol/L 134  136  133   Potassium 3.5 - 5.1  mmol/L 3.4  3.9  4.3   Chloride 98 - 111 mmol/L 104  101  103   CO2 22 - 32 mmol/L 22  20  19    Calcium  8.9 - 10.3 mg/dL 8.8  9.1  9.0    Chest imaging: CXR  03/09/24 The heart size and mediastinal contours are within normal limits. Both lungs are clear. No pleural effusion or pneumothorax. No acute osseous abnormality.  PFT:    Latest Ref Rng & Units 06/09/2022    3:32 PM 04/06/2020    1:56 PM  PFT Results  FVC-Pre L 3.19  2.89   FVC-Predicted Pre % 90  95   FVC-Post L 3.12  3.01    FVC-Predicted Post % 88  99   Pre FEV1/FVC % % 86  78   Post FEV1/FCV % % 88  82   FEV1-Pre L 2.73  2.24   FEV1-Predicted Pre % 90  85   FEV1-Post L 2.75  2.47   DLCO uncorrected ml/min/mmHg 24.54  29.10   DLCO UNC% % 117  139   DLCO corrected ml/min/mmHg 25.63  29.10   DLCO COR %Predicted % 122  139   DLVA Predicted % 127  149   TLC L 4.59  4.35   TLC % Predicted % 96  91   RV % Predicted % 111  102     Labs:  Path:  Echo:  Heart Catheterization:    Assessment & Plan:   Mild intermittent asthma, unspecified whether complicated - Plan: fluticasone -salmeterol (ADVAIR HFA) 115-21 MCG/ACT inhaler  Discussion: Veronica [name] is a 33 year old, never smoker with anxiety, GERD and mild intermittent asthma who returns to pulmonary clinic for acute visit due to shortness of breath after eating.  Asthma - try advair HFA 115-21mcg 2 puffs twice daily - Use albuterol  inhaler as needed for acute symptoms. - Instruct to rinse mouth after using inhalers to prevent thrush. - Monitor symptoms for 2-4 weeks and follow up if no improvement.  Gastroesophageal reflux disease (GERD) Hiatal hernia - continue current GERD regimen  Anxiety disorder Anxiety may contribute to shortness of breath and throat tightness. Current medications include fluoxetine , clonazepam, and hydroxyzine . Discussed potential mood side effects of montelukast and decided against starting it. - Continue current anxiety medications.  Follow up as scheduled with Dr. Baldwin Levee or Roena Clark, NP  Duaine German, MD [facility] Office: [phone]     Current Outpatient Medications:    albuterol  (VENTOLIN  HFA) 108 (90 Base) MCG/ACT inhaler, INHALE 2 PUFFS BY MOUTH EVERY 4 HOURS AS NEEDED FOR WHEEZING OR SHORTNESS OF BREATH (COUGH)., Disp: 18 g, Rfl: 0   Blood Pressure KIT, Please use to check blood pressure as needed., Disp: 1 kit, Rfl: 0   cetirizine  (ZYRTEC ) 10 MG tablet, Take 1 tablet  (10 mg total) by mouth daily as needed for allergies., Disp: 30 tablet, Rfl: 11   clonazePAM (KLONOPIN) 1 MG tablet, Take 1 mg by mouth daily as needed for anxiety., Disp: , Rfl:    etonogestrel  (NEXPLANON ) 68 MG IMPL implant, 1 each by Subdermal route once., Disp: , Rfl:    ferrous sulfate  (FEROSUL) 325 (65 FE) MG tablet, Take 1 tablet (325 mg total) by mouth every other day., Disp: 30 tablet, Rfl: 2   FLUoxetine  (PROZAC ) 20 MG tablet, Take 20 mg by mouth daily., Disp: , Rfl:    fluticasone  (FLONASE ) 50 MCG/ACT nasal spray, Place 2 sprays into both nostrils daily., Disp: 16 g,  Rfl: 6   fluticasone -salmeterol (ADVAIR HFA) 115-21 MCG/ACT inhaler, Inhale 2 puffs into the lungs 2 (two) times daily., Disp: 1 each, Rfl: 12   hydroxypropyl methylcellulose / hypromellose (ISOPTO TEARS / GONIOVISC) 2.5 % ophthalmic solution, Place 1 drop into both eyes 4 (four) times daily as needed for dry eyes., Disp: 15 mL, Rfl: 0   hydrOXYzine  (ATARAX ) 10 MG tablet, Take 10 mg by mouth., Disp: , Rfl:    pantoprazole  (PROTONIX ) 40 MG tablet, Take 40 mg by mouth 2 (two) times daily., Disp: , Rfl:    polyethylene glycol (MIRALAX  / GLYCOLAX ) 17 g packet, Take 17 g by mouth 2 (two) times daily., Disp: 14 each, Rfl: 1   senna (SENOKOT) 8.6 MG TABS tablet, Take 1 tablet (8.6 mg total) by mouth 2 (two) times daily., Disp: 120 tablet, Rfl: 0   trimethoprim -polymyxin b  (POLYTRIM ) ophthalmic solution, Place 1 drop into the left eye every 6 (six) hours., Disp: 10 mL, Rfl: 0

## 2024-04-12 NOTE — Patient Instructions (Addendum)
 Start symbicort 80-4.5mcg 2 puffs twice daily - rinse mouth out after each use  Stop using flovent  inhaler while using symbicort  Use albuterol  inhaler 1-2 puffs every 4-6 hours as needed  Follow up as scheduled with Dr. Baldwin Levee or Roena Clark, NP

## 2024-04-13 DIAGNOSIS — F410 Panic disorder [episodic paroxysmal anxiety] without agoraphobia: Secondary | ICD-10-CM | POA: Diagnosis not present

## 2024-04-15 DIAGNOSIS — F410 Panic disorder [episodic paroxysmal anxiety] without agoraphobia: Secondary | ICD-10-CM | POA: Diagnosis not present

## 2024-04-18 ENCOUNTER — Ambulatory Visit: Admitting: Family Medicine

## 2024-04-18 VITALS — BP 109/75 | HR 64 | Temp 98.6°F | Ht 62.0 in | Wt 229.1 lb

## 2024-04-18 DIAGNOSIS — J069 Acute upper respiratory infection, unspecified: Secondary | ICD-10-CM | POA: Diagnosis present

## 2024-04-18 DIAGNOSIS — K219 Gastro-esophageal reflux disease without esophagitis: Secondary | ICD-10-CM | POA: Diagnosis not present

## 2024-04-18 MED ORDER — FAMOTIDINE 20 MG PO TABS: 20.0000 mg | ORAL_TABLET | Freq: Two times a day (BID) | ORAL | 0 refills | Status: DC

## 2024-04-18 NOTE — Progress Notes (Signed)
    SUBJECTIVE:   CHIEF COMPLAINT / HPI: itch throat  Both ears achy and itchy for a week Throat started burning/pain full yesterday New cough over a week No fevers Maybe had some shortness of breath with walking around Had hard time catching full breath Has had wheezing. Has not had asthma attack in over a year. Using Advair regularly, help some. No ear drainage.   Expresses worsening GERD. Would like to try pepcid .  Has complaint of some increased dizziness. Wants Magnesium  checked  PERTINENT  PMH / PSH: Allergic rhinitis, Asthma  OBJECTIVE:   BP 109/75   Pulse 64   Temp 98.6 F (37 C) (Oral)   Ht 5\' 2"  (1.575 m)   Wt 229 lb 2 oz (103.9 kg)   SpO2 100%   BMI 41.91 kg/m   General: NAD HEENT: Bilateral tympanic membranes, clear cone of light, no bulging or erythema, mild cerumen debris within bilateral ear canals, mildly bilateral inflamed nasal turbinates with congestion, no posterior oropharyngeal erythema, no tonsillar asymmetry, mild anterior cervical lymphadenopathy Neuro: A&O Cardiovascular: RRR, no murmurs, no peripheral edema Respiratory: normal WOB on RA, CTAB, no wheezes, ronchi or rales Extremities: Moving all 4 extremities equally   ASSESSMENT/PLAN:   Assessment & Plan Viral upper respiratory tract infection Suspect the patient has mild viral upper respiratory fracture in the setting of chronic allergic rhinitis.  Recommended that she restart her Flonase  and Zyrtec  to help with her allergies.  Suspect this will help with her sensation of itchiness in her ears.  Also recommended Debrox to help clean her ear canals.  Counseled regarding supportive care for viral illness, and outlined within her AVS.  Very low suspicion for bacterial infection requiring antibiotics, reassuring respiratory exam, normal work of breathing on room air.  Not clinically consistent with asthma exacerbation.  Counseled on continuing her Advair and albuterol  as  needed. Gastroesophageal reflux disease, unspecified whether esophagitis present Will trial Pepcid  in addition to Protonix .  Follow-up 1 week for already scheduled appointment with Dr. Sowell.  Given clinic time constraints, recommended to follow-up with Dr. Tenna Fees, regarding her questions about magnesium  and dizziness.  Patient agreeable to plan.  Return in about 1 week (around 04/25/2024).  Some or all of this note was dictated use Dragon software, there may be unintentional errors present.   Ivin Marrow, MD [facility]

## 2024-04-18 NOTE — Assessment & Plan Note (Signed)
 Will trial Pepcid  in addition to Protonix .  Follow-up 1 week for already scheduled appointment with Dr. Sowell.

## 2024-04-18 NOTE — Patient Instructions (Addendum)
 It was great to see you! Thank you for allowing me to participate in your care!  Our plans for today:  - Please try getting some over the counter ear cleaning solution, this is called DEBROX. - I expect this to help with your ears itching.  - Zyrtec  may also help with this. - You have a viral illness causing your symptoms. - Please make sure to keep using your Advair daily. - Use your albuterol  inhaler has needed. - This will get better in the next several days. - You may use Tylenol  and Ibuprofen  as needed for pain. - Over the counter allergy medicine such as Claritin  and Flonase  may help with your congestion. - Cough drops may help with your throat and cough. - If you do not get better in the next 5 days please return to care. - It is important to stay hydrated, and continue eating while sick. - Follow-up in 1 week for your dizziness with Dr. Sowell.    Please arrive 15 minutes PRIOR to your next scheduled appointment time! If you do not, this affects OTHER patients' care.  Take care and seek immediate care sooner if you develop any concerns.   Ivin Marrow, MD, PGY-2 [facility] 11:10 AM 04/18/2024  [facility]

## 2024-04-19 DIAGNOSIS — F410 Panic disorder [episodic paroxysmal anxiety] without agoraphobia: Secondary | ICD-10-CM | POA: Diagnosis not present

## 2024-04-19 DIAGNOSIS — F411 Generalized anxiety disorder: Secondary | ICD-10-CM | POA: Diagnosis not present

## 2024-04-21 DIAGNOSIS — F410 Panic disorder [episodic paroxysmal anxiety] without agoraphobia: Secondary | ICD-10-CM | POA: Diagnosis not present

## 2024-04-24 NOTE — Progress Notes (Signed)
  SUBJECTIVE:   CHIEF COMPLAINT / HPI:   Anxiety Doing better and not having as many panic attacks or as bad. Feels that medication is working, and seeing therapist weekly. Taking meds regularly and is having less symptoms when she has panic attack  GERD Was on pantoprazole  and then switched to omeprazole . Is using Pepcid  now and feels that it is helping. Would like refill.   Paresthesia Having intermittent tingling sensation in distal extremities.  Wanting to check Mg and Vitamin D levels   Itchy Ears Patient noting itchy ears and throat at times. Has hx  of allergies, but only takes allergy medicine prn, zyrtec , is not using flonaze  PERTINENT  PMH / PSH:   OBJECTIVE:  BP 113/75   Pulse 68   Ht 5\' 2"  (1.575 m)   Wt 229 lb (103.9 kg)   SpO2 100%   BMI 41.88 kg/m  Physical Exam Constitutional:      General: She is not in acute distress.    Appearance: Normal appearance. She is not ill-appearing.  HENT:     Right Ear: Tympanic membrane, ear canal and external ear normal.     Left Ear: Tympanic membrane, ear canal and external ear normal.  Cardiovascular:     Rate and Rhythm: Normal rate and regular rhythm.     Pulses: Normal pulses.     Heart sounds: Normal heart sounds. No murmur heard.    No friction rub. No gallop.  Pulmonary:     Effort: Pulmonary effort is normal. No respiratory distress.     Breath sounds: Normal breath sounds. No stridor. No wheezing, rhonchi or rales.  Neurological:     Mental Status: She is alert.      ASSESSMENT/PLAN:   Assessment & Plan Gastroesophageal reflux disease, unspecified whether esophagitis present Patient comes in for follow-up of her GERD.  Patient reports she had been taking pantoprazole , but stopped working, was taken omeprazole , with given her headaches, was switched to Pepcid , and feels like she is getting good relief with this.  Will continue Pepcid . - Pepcid  20 mg twice daily GAD (generalized anxiety  disorder) Patient reports anxiety is doing better.  Patient reports good compliance with medication and feels that is working.  Patient is having less panic attacks and less extreme panic attacks.  Patient is seeing therapist weekly and taking medication regularly.  Plans to follow-up with psychiatry. - Follow-up with psychiatry - Continue with therapy Paresthesia Patient noting recent history of paresthesias.  Patient appreciates paresthesias in distal extremities, sporadically.  Patient wanting lab work to evaluate.  Will check for vitamin D, magnesium , B12, RPR. - Vitamin D, magnesium , vitamin B12, RPR, HIV Ear itching Patient appreciates few weeks of itching ears, and itchy throat, patient has history of allergies, but is not only take allergy medicine as needed.  Patient was not using Flonase  previously.  Will recommend patient takes Zyrtec  daily, and Flonase  daily. - Zyrtec  10 mg daily - Flonase  2 sprays each nostril daily No follow-ups on file. Wilhemena Harbour, MD 04/25/2024, 3:35 PM PGY-3, [facility]

## 2024-04-24 NOTE — Patient Instructions (Addendum)
 It was great to see you! Thank you for allowing me to participate in your care!  I recommend that you always bring your medications to each appointment as this makes it easy to ensure we are on the correct medications and helps us  not miss when refills are needed.  Our plans for today:  - GERD  Pepcid  20 mg twice a day  - Anxiety Glad to hear it's getting better. Continue Prozac  and therapy. Follow up with your psychiatrist as needed.   - Tingling sensation  Checking Mg, Vitamin D, RPR, HIV, B12 levels  We are checking some labs today, I will call you if they are abnormal will send you a MyChart message or a letter if they are normal.  If you do not hear about your labs in the next 2 weeks please let us  know.  Take care and seek immediate care sooner if you develop any concerns.   Dr. Wilhemena Harbour, MD [facility]

## 2024-04-25 ENCOUNTER — Encounter: Payer: Self-pay | Admitting: Student

## 2024-04-25 ENCOUNTER — Ambulatory Visit (INDEPENDENT_AMBULATORY_CARE_PROVIDER_SITE_OTHER): Admitting: Student

## 2024-04-25 VITALS — BP 113/75 | HR 68 | Ht 62.0 in | Wt 229.0 lb

## 2024-04-25 DIAGNOSIS — K219 Gastro-esophageal reflux disease without esophagitis: Secondary | ICD-10-CM | POA: Diagnosis not present

## 2024-04-25 DIAGNOSIS — R202 Paresthesia of skin: Secondary | ICD-10-CM | POA: Diagnosis not present

## 2024-04-25 DIAGNOSIS — L299 Pruritus, unspecified: Secondary | ICD-10-CM | POA: Diagnosis not present

## 2024-04-25 DIAGNOSIS — F411 Generalized anxiety disorder: Secondary | ICD-10-CM

## 2024-04-25 LAB — POCT GLYCOSYLATED HEMOGLOBIN (HGB A1C): Hemoglobin A1C: 5.4 % (ref 4.0–5.6)

## 2024-04-25 MED ORDER — CETIRIZINE HCL 10 MG PO TABS: 10.0000 mg | ORAL_TABLET | Freq: Every day | ORAL | 11 refills | Status: DC | PRN

## 2024-04-25 MED ORDER — FLUTICASONE PROPIONATE 50 MCG/ACT NA SUSP: 2.0000 | Freq: Every day | NASAL | 6 refills | Status: AC

## 2024-04-25 MED ORDER — FAMOTIDINE 20 MG PO TABS
20.0000 mg | ORAL_TABLET | Freq: Two times a day (BID) | ORAL | 2 refills | Status: DC
Start: 2024-04-25 — End: 2024-08-11

## 2024-04-25 NOTE — Assessment & Plan Note (Addendum)
 Patient reports anxiety is doing better.  Patient reports good compliance with medication and feels that is working.  Patient is having less panic attacks and less extreme panic attacks.  Patient is seeing therapist weekly and taking medication regularly.  Plans to follow-up with psychiatry. - Follow-up with psychiatry - Continue with therapy

## 2024-04-25 NOTE — Assessment & Plan Note (Signed)
 Patient noting recent history of paresthesias.  Patient appreciates paresthesias in distal extremities, sporadically.  Patient wanting lab work to evaluate.  Will check for vitamin D, magnesium , B12, RPR. - Vitamin D, magnesium , vitamin B12, RPR, HIV

## 2024-04-25 NOTE — Assessment & Plan Note (Addendum)
 Patient comes in for follow-up of her GERD.  Patient reports she had been taking pantoprazole , but stopped working, was taken omeprazole , with given her headaches, was switched to Pepcid , and feels like she is getting good relief with this.  Will continue Pepcid . - Pepcid  20 mg twice daily

## 2024-04-25 NOTE — Assessment & Plan Note (Signed)
 Patient appreciates few weeks of itching ears, and itchy throat, patient has history of allergies, but is not only take allergy medicine as needed.  Patient was not using Flonase  previously.  Will recommend patient takes Zyrtec  daily, and Flonase  daily. - Zyrtec  10 mg daily - Flonase  2 sprays each nostril daily

## 2024-04-26 ENCOUNTER — Ambulatory Visit: Payer: Self-pay | Admitting: Student

## 2024-04-26 LAB — SYPHILIS: RPR W/REFLEX TO RPR TITER AND TREPONEMAL ANTIBODIES, TRADITIONAL SCREENING AND DIAGNOSIS ALGORITHM: RPR Ser Ql: NONREACTIVE

## 2024-04-26 LAB — HIV ANTIBODY (ROUTINE TESTING W REFLEX): HIV Screen 4th Generation wRfx: NONREACTIVE

## 2024-04-26 LAB — MAGNESIUM: Magnesium: 2.1 mg/dL (ref 1.6–2.3)

## 2024-04-26 LAB — VITAMIN D 25 HYDROXY (VIT D DEFICIENCY, FRACTURES): Vit D, 25-Hydroxy: 30.2 ng/mL (ref 30.0–100.0)

## 2024-04-26 LAB — VITAMIN B12: Vitamin B-12: 620 pg/mL (ref 232–1245)

## 2024-04-28 DIAGNOSIS — F411 Generalized anxiety disorder: Secondary | ICD-10-CM | POA: Diagnosis not present

## 2024-05-03 DIAGNOSIS — F411 Generalized anxiety disorder: Secondary | ICD-10-CM | POA: Diagnosis not present

## 2024-05-08 ENCOUNTER — Other Ambulatory Visit: Payer: Self-pay | Admitting: Student

## 2024-05-11 DIAGNOSIS — F410 Panic disorder [episodic paroxysmal anxiety] without agoraphobia: Secondary | ICD-10-CM | POA: Diagnosis not present

## 2024-05-12 DIAGNOSIS — F410 Panic disorder [episodic paroxysmal anxiety] without agoraphobia: Secondary | ICD-10-CM | POA: Diagnosis not present

## 2024-05-17 ENCOUNTER — Encounter: Payer: Self-pay | Admitting: *Deleted

## 2024-05-17 DIAGNOSIS — F410 Panic disorder [episodic paroxysmal anxiety] without agoraphobia: Secondary | ICD-10-CM | POA: Diagnosis not present

## 2024-05-17 DIAGNOSIS — F411 Generalized anxiety disorder: Secondary | ICD-10-CM | POA: Diagnosis not present

## 2024-05-27 DIAGNOSIS — F410 Panic disorder [episodic paroxysmal anxiety] without agoraphobia: Secondary | ICD-10-CM | POA: Diagnosis not present

## 2024-05-30 NOTE — Progress Notes (Unsigned)
 Cardiology Office Note:    Date:  05/31/2024   ID:  Veronica [name], DOB [birth year], MRN [id number]  PCP:  Jennelle Riis, MD  Cardiologist:  Oneil Parchment, MD  Electrophysiologist:  None   Referring MD: Hildegard Loge, [name]   Chief Complaint  Patient presents with   Chest Pain   New Patient (Initial Visit)    History of Present Illness:    Veronica [name] is a 33 y.o. female with a hx of asthma, morbid obesity who presents as an ED follow-up for chest pain.  She was seen in the ED on 02/27/2024.  Workup unremarkable, including negative troponins x 2.  She reports has been having chest pain, shortness of breath, and feeling heart is racing.  Suspected was having panic attacks, was occurring frequently earlier this year.  States that she feels these symptoms when she is stressed.  She describes chest pain as burning in center of her chest.  Can last 20 minutes.  She goes for walks for exercise, denies any exertional chest pain.  She denies any exertional dyspnea.  Reports some lightheadedness but denies any syncope.  Was having palpitations about twice per week, typically lasts for short duration.  No smoking history.  Family history includes her mother died of MI at age 28.   Past Medical History:  Diagnosis Date   Abdominal pain 01/06/2012   Acute tension headache 07/27/2019   Anemia    Ankle pain    Anxiety    Asthma    Birth control counseling 08/26/2011   BMI 40.0-44.9, adult (HCC) 02/27/2011   Dysuria 01/13/2013   Epigastric pain 09/13/2019   GERD (gastroesophageal reflux disease)    Heart murmur    Hiatal hernia    Obesity (BMI 30-39.9)    Pre-eclampsia    off med since 4/23   Secondary amenorrhea 03/25/2018   Trichomonas infection 06/17/2022    Past Surgical History:  Procedure Laterality Date   CESAREAN SECTION  09/02/2010   For macrosomia, but son was 7 lb 12 oz   CESAREAN SECTION N/A 10/06/2021   Procedure: CESAREAN SECTION;  Surgeon: Izell Harari, MD;   Location: [facility];  Service: Obstetrics;  Laterality: N/A;   CHOLECYSTECTOMY N/A 06/16/2022   Procedure: LAPAROSCOPIC  CHOLECYSTECTOMY;  Surgeon: Signe Mitzie LABOR, MD;  Location: [facility] ORS;  Service: General;  Laterality: N/A;   TONSILLECTOMY AND ADENOIDECTOMY      Current Medications: Current Meds  Medication Sig   albuterol  (VENTOLIN  HFA) 108 (90 Base) MCG/ACT inhaler INHALE 2 PUFFS BY MOUTH EVERY 4 HOURS AS NEEDED FOR WHEEZING OR SHORTNESS OF BREATH OR COUGH. Make appointment to be seen   Blood Pressure KIT Please use to check blood pressure as needed.   cetirizine  (ZYRTEC ) 10 MG tablet Take 1 tablet (10 mg total) by mouth daily as needed for allergies.   clonazePAM (KLONOPIN) 1 MG tablet Take 1 mg by mouth daily as needed for anxiety.   etonogestrel  (NEXPLANON ) 68 MG IMPL implant 1 each by Subdermal route once.   famotidine  (PEPCID ) 20 MG tablet Take 1 tablet (20 mg total) by mouth 2 (two) times daily.   ferrous sulfate  (FEROSUL) 325 (65 FE) MG tablet Take 1 tablet (325 mg total) by mouth every other day.   FLUoxetine  (PROZAC ) 20 MG tablet Take 20 mg by mouth daily.   fluticasone  (FLONASE ) 50 MCG/ACT nasal spray Place 2 sprays into both nostrils daily.   fluticasone -salmeterol (ADVAIR HFA) 115-21 MCG/ACT inhaler Inhale 2 puffs into  the lungs 2 (two) times daily.   hydroxypropyl methylcellulose / hypromellose (ISOPTO TEARS / GONIOVISC) 2.5 % ophthalmic solution Place 1 drop into both eyes 4 (four) times daily as needed for dry eyes.   hydrOXYzine  (ATARAX ) 10 MG tablet Take 10 mg by mouth.   polyethylene glycol (MIRALAX  / GLYCOLAX ) 17 g packet Take 17 g by mouth 2 (two) times daily.   senna (SENOKOT) 8.6 MG TABS tablet Take 1 tablet (8.6 mg total) by mouth 2 (two) times daily.   trimethoprim -polymyxin b  (POLYTRIM ) ophthalmic solution Place 1 drop into the left eye every 6 (six) hours.     Allergies:   Morphine , Ibuprofen , and Morphine  and codeine    Social History   Socioeconomic  History   Marital status: Significant Other    Spouse name: Not on file   Number of children: 2   Years of education: Not on file   Highest education level: Not on file  Occupational History   Occupation: home health care  Tobacco Use   Smoking status: Never   Smokeless tobacco: Never  Vaping Use   Vaping status: Never Used  Substance and Sexual Activity   Alcohol use: Not Currently   Drug use: Not Currently    Comment: last used in teens   Sexual activity: Yes    Partners: Male    Birth control/protection: Implant  Other Topics Concern   Not on file  Social History Narrative   Lives son ([name] 09/02/2010 ), sister Lanney Roorda 10/28/94), and mom (Mary Sausedo-Gillis) and mom's fiance.    Attending [employer] to obtain GED.   Mom's fiance smokes outside house.    Pet: dog               Social Drivers of Corporate investment banker Strain: Low Risk  (01/29/2022)   Overall Financial Resource Strain (CARDIA)    Difficulty of Paying Living Expenses: Not hard at all  Food Insecurity: No Food Insecurity (08/13/2021)   Hunger Vital Sign    Worried About Running Out of Food in the Last Year: Never true    Ran Out of Food in the Last Year: Never true  Transportation Needs: No Transportation Needs (08/13/2021)   PRAPARE - Administrator, Civil Service (Medical): No    Lack of Transportation (Non-Medical): No  Physical Activity: Sufficiently Active (01/29/2022)   Exercise Vital Sign    Days of Exercise per Week: 3 days    Minutes of Exercise per Session: 60 min  Stress: Stress Concern Present (01/29/2022)   [employer] of Occupational Health - Occupational Stress Questionnaire    Feeling of Stress : Very much  Social Connections: Socially Integrated (01/29/2022)   Social Connection and Isolation Panel    Frequency of Communication with Friends and Family: Three times a week    Frequency of Social Gatherings with Friends and Family: Twice a week    Attends  Religious Services: More than 4 times per year    Active Member of [employer] or Organizations: Yes    Attends Engineer, structural: More than 4 times per year    Marital Status: Living with partner     Family History: The patient's family history includes Diabetes in her mother; Heart attack (age of onset: 53) in her mother; Hypertension in her mother; Obesity in her mother; Other in her sister. There is no history of Esophageal cancer, Colon cancer, or Liver disease.  ROS:   Please see the history of  present illness.     All other systems reviewed and are negative.  EKGs/Labs/Other Studies Reviewed:    The following studies were reviewed today:   EKG:   05/31/2024: Normal sinus rhythm, rate 71, no ST abnormalities  Recent Labs: 03/09/2024: ALT 18 04/01/2024: TSH 1.450 04/04/2024: BUN 8; Creatinine, Ser 1.03; Hemoglobin 12.6; Platelets 283; Potassium 3.4; Sodium 134 04/25/2024: Magnesium  2.1  Recent Lipid Panel    Component Value Date/Time   CHOL 136 03/16/2023 1723   TRIG 148 03/16/2023 1723   HDL 35 (L) 03/16/2023 1723   CHOLHDL 3.9 03/16/2023 1723   LDLCALC 75 03/16/2023 1723   LDLDIRECT 66 11/27/2023 1453    Physical Exam:    VS:  BP 118/78 (BP Location: Right Arm, Patient Position: Sitting, Cuff Size: Large)   Pulse 71   Resp 16   Ht 5' 2 (1.575 m)   Wt 235 lb 3.2 oz (106.7 kg)   SpO2 95%   BMI 43.02 kg/m     Wt Readings from Last 3 Encounters:  05/31/24 235 lb 3.2 oz (106.7 kg)  04/25/24 229 lb (103.9 kg)  04/18/24 229 lb 2 oz (103.9 kg)     GEN:  Well nourished, well developed in no acute distress HEENT: Normal NECK: No JVD; No carotid bruits LYMPHATICS: No lymphadenopathy CARDIAC: RRR, no murmurs, rubs, gallops RESPIRATORY:  Clear to auscultation without rales, wheezing or rhonchi  ABDOMEN: Soft, non-tender, non-distended MUSCULOSKELETAL:  No edema; No deformity  SKIN: Warm and dry NEUROLOGIC:  Alert and oriented x 3 PSYCHIATRIC:  Normal affect    ASSESSMENT:    1. Precordial pain   2. Palpitations   3. Morbid obesity (HCC)    PLAN:    Chest pain: Atypical in description.  Given age, would have low suspicion for obstructive CAD but does have significant family history (mother died of MI at age 56).  EKG is interpretable and she is able to exercise, will check ETT to evaluate for ischemia.  Check echocardiogram to evaluate for structural heart disease  Palpitations: Description concerning for arrhythmia, will evaluate with Zio patch x 2 weeks  Morbid obesity: Body mass index is 43.02 kg/m.  Diet/exercise recommended  RTC in 6 months  Informed Consent   Shared Decision Making/Informed Consent The risks [chest pain, shortness of breath, cardiac arrhythmias, dizziness, blood pressure fluctuations, myocardial infarction, stroke/transient ischemic attack, and life-threatening complications (estimated to be 1 in 10,000)], benefits (risk stratification, diagnosing coronary artery disease, treatment guidance) and alternatives of an exercise tolerance test were discussed in detail with Ms. Matteucci and she agrees to proceed.      Medication Adjustments/Labs and Tests Ordered: Current medicines are reviewed at length with the patient today.  Concerns regarding medicines are outlined above.  Orders Placed This Encounter  Procedures   Cardiac Stress Test: Informed Consent Details: Physician/Practitioner Attestation; Transcribe to consent form and obtain patient signature   LONG TERM MONITOR (3-14 DAYS)   EXERCISE TOLERANCE TEST (ETT)   EKG 12-Lead   ECHOCARDIOGRAM COMPLETE   No orders of the defined types were placed in this encounter.   Patient Instructions  Medication Instructions:  Continue current medications *If you need a refill on your cardiac medications before your next appointment, please call your pharmacy*  Lab Work: none If you have labs (blood work) drawn today and your tests are completely normal, you will receive  your results only by: MyChart Message (if you have MyChart) OR A paper copy in the mail If you have  any lab test that is abnormal or we need to change your treatment, we will call you to review the results.  Testing/Procedures: Echo  Your physician has requested that you have an echocardiogram. Echocardiography is a painless test that uses sound waves to create images of your heart. It provides your doctor with information about the size and shape of your heart and how well your heart's chambers and valves are working. This procedure takes approximately one hour. There are no restrictions for this procedure. Please do NOT wear cologne, perfume, aftershave, or lotions (deodorant is allowed). Please arrive 15 minutes prior to your appointment time.  Please note: We ask at that you not bring children with you during ultrasound (echo/ vascular) testing. Due to room size and safety concerns, children are not allowed in the ultrasound rooms during exams. Our front office staff cannot provide observation of children in our lobby area while testing is being conducted. An adult accompanying a patient to their appointment will only be allowed in the ultrasound room at the discretion of the ultrasound technician under special circumstances. We apologize for any inconvenience.   Follow-Up: At [facility], you and your health needs are our priority.  As part of our continuing mission to provide you with exceptional heart care, our providers are all part of one team.  This team includes your primary Cardiologist (physician) and Advanced Practice Providers or APPs (Physician Assistants and Nurse Practitioners) who all work together to provide you with the care you need, when you need it.  Your next appointment:   6 month(s)  Provider:   Dr.Jawanda Passey  We recommend signing up for the patient portal called MyChart.  Sign up information is provided on this After Visit Summary.  MyChart is used to  connect with patients for Virtual Visits (Telemedicine).  Patients are able to view lab/test results, encounter notes, upcoming appointments, etc.  Non-urgent messages can be sent to your provider as well.   To learn more about what you can do with MyChart, go to [email or web address].   Other Instructions Zio  ZIO XT- Long Term Monitor Instructions  Your physician has requested you wear a ZIO patch monitor for 14 days.  This is a single patch monitor. [employer] supplies one patch monitor per enrollment. Additional stickers are not available. Please do not apply patch if you will be having a Nuclear Stress Test,  Echocardiogram, Cardiac CT, MRI, or Chest Xray during the period you would be wearing the  monitor. The patch cannot be worn during these tests. You cannot remove and re-apply the  ZIO XT patch monitor.  Your ZIO patch monitor will be mailed 3 day USPS to your address on file. It may take 3-5 days  to receive your monitor after you have been enrolled.  Once you have received your monitor, please review the enclosed instructions. Your monitor  has already been registered assigning a specific monitor serial # to you.  Billing and Patient Assistance Program Information  We have supplied [employer] with any of your insurance information on file for billing purposes. [employer] offers a sliding scale Patient Assistance Program for patients that do not have  insurance, or whose insurance does not completely cover the cost of the ZIO monitor.  You must apply for the Patient Assistance Program to qualify for this discounted rate.  To apply, please call [employer] at [phone], select option 4, select option 2, ask to apply for  Patient Assistance Program. Meredeth will ask your household income, and  how many people  are in your household. They will quote your out-of-pocket cost based on that information.  [employer] will also be able to set up a 9-month, interest-free payment plan if  needed.  Applying the monitor   Shave hair from upper left chest.  Hold abrader disc by orange tab. Rub abrader in 40 strokes over the upper left chest as  indicated in your monitor instructions.  Clean area with 4 enclosed alcohol pads. Let dry.  Apply patch as indicated in monitor instructions. Patch will be placed under collarbone on left  side of chest with arrow pointing upward.  Rub patch adhesive wings for 2 minutes. Remove white label marked 1. Remove the white  label marked 2. Rub patch adhesive wings for 2 additional minutes.  While looking in a mirror, press and release button in center of patch. A small green light will  flash 3-4 times. This will be your only indicator that the monitor has been turned on.  Do not shower for the first 24 hours. You may shower after the first 24 hours.  Press the button if you feel a symptom. You will hear a small click. Record Date, Time and  Symptom in the Patient Logbook.  When you are ready to remove the patch, follow instructions on the last 2 pages of Patient  Logbook. Stick patch monitor onto the last page of Patient Logbook.  Place Patient Logbook in the blue and white box. Use locking tab on box and tape box closed  securely. The blue and white box has prepaid postage on it. Please place it in the mailbox as  soon as possible. Your physician should have your test results approximately 7 days after the  monitor has been mailed back to [facility].  Call [facility] Customer Care at [phone] if you have questions regarding  your ZIO XT patch monitor. Call them immediately if you see an orange light blinking on your  monitor.  If your monitor falls off in less than 4 days, contact our Monitor department at [phone].  If your monitor becomes loose or falls off after 4 days call [employer] at [phone] for  suggestions on securing your monitor    Your physician has requested that you have an Exercise Stress Test. An  exercise tolerance test is a test to check how your heart works during exercise. You will need to walk on a treadmill for this test. An electrocardiogram (ECG) will record your heartbeat when you are at rest and when you are exercising.    Please arrive 15 minutes prior to your appointment time for registration and insurance purposes.   The test will take approximately 45 minutes to complete.   How to prepare for your Exercise Stress Test: Do bring a list of your current medications with you.  If not listed below, you may take your medications as normal. Do wear comfortable clothes (no dresses or overalls) and walking shoes, tennis shoes preferred (no heels or open toed shoes are allowed) Do Not wear cologne, perfume, aftershave or lotions (deodorant is allowed). Do not drink or eat foods with caffeine for 24 hours before the test. (Chocolate, coffee, tea, or energy drinks) If you use an inhaler, bring it with you to the test. Do not smoke for 4 hours before the test.    If these instructions are not followed, your test will have to be rescheduled.   If you cannot keep your appointment, please provide 24 hours notification to our office,  to avoid a possible $50 charge to your account.          Signed, Lonni LITTIE Nanas, MD  05/31/2024 4:01 PM    [facility]

## 2024-05-31 ENCOUNTER — Ambulatory Visit: Admitting: Cardiology

## 2024-05-31 ENCOUNTER — Ambulatory Visit: Attending: Cardiology | Admitting: Cardiology

## 2024-05-31 ENCOUNTER — Ambulatory Visit

## 2024-05-31 ENCOUNTER — Encounter: Payer: Self-pay | Admitting: Cardiology

## 2024-05-31 VITALS — BP 118/78 | HR 71 | Resp 16 | Ht 62.0 in | Wt 235.2 lb

## 2024-05-31 DIAGNOSIS — R002 Palpitations: Secondary | ICD-10-CM | POA: Insufficient documentation

## 2024-05-31 DIAGNOSIS — R072 Precordial pain: Secondary | ICD-10-CM | POA: Diagnosis not present

## 2024-05-31 NOTE — Patient Instructions (Signed)
 Medication Instructions:  Continue current medications *If you need a refill on your cardiac medications before your next appointment, please call your pharmacy*  Lab Work: none If you have labs (blood work) drawn today and your tests are completely normal, you will receive your results only by: MyChart Message (if you have MyChart) OR A paper copy in the mail If you have any lab test that is abnormal or we need to change your treatment, we will call you to review the results.  Testing/Procedures: Echo  Your physician has requested that you have an echocardiogram. Echocardiography is a painless test that uses sound waves to create images of your heart. It provides your doctor with information about the size and shape of your heart and how well your heart's chambers and valves are working. This procedure takes approximately one hour. There are no restrictions for this procedure. Please do NOT wear cologne, perfume, aftershave, or lotions (deodorant is allowed). Please arrive 15 minutes prior to your appointment time.  Please note: We ask at that you not bring children with you during ultrasound (echo/ vascular) testing. Due to room size and safety concerns, children are not allowed in the ultrasound rooms during exams. Our front office staff cannot provide observation of children in our lobby area while testing is being conducted. An adult accompanying a patient to their appointment will only be allowed in the ultrasound room at the discretion of the ultrasound technician under special circumstances. We apologize for any inconvenience.   Follow-Up: At [facility], you and your health needs are our priority.  As part of our continuing mission to provide you with exceptional heart care, our providers are all part of one team.  This team includes your primary Cardiologist (physician) and Advanced Practice Providers or APPs (Physician Assistants and Nurse Practitioners) who all work  together to provide you with the care you need, when you need it.  Your next appointment:   6 month(s)  Provider:   Dr.Schumann  We recommend signing up for the patient portal called MyChart.  Sign up information is provided on this After Visit Summary.  MyChart is used to connect with patients for Virtual Visits (Telemedicine).  Patients are able to view lab/test results, encounter notes, upcoming appointments, etc.  Non-urgent messages can be sent to your provider as well.   To learn more about what you can do with MyChart, go to [email or web address].   Other Instructions Zio  ZIO XT- Long Term Monitor Instructions  Your physician has requested you wear a ZIO patch monitor for 14 days.  This is a single patch monitor. [employer] supplies one patch monitor per enrollment. Additional stickers are not available. Please do not apply patch if you will be having a Nuclear Stress Test,  Echocardiogram, Cardiac CT, MRI, or Chest Xray during the period you would be wearing the  monitor. The patch cannot be worn during these tests. You cannot remove and re-apply the  ZIO XT patch monitor.  Your ZIO patch monitor will be mailed 3 day USPS to your address on file. It may take 3-5 days  to receive your monitor after you have been enrolled.  Once you have received your monitor, please review the enclosed instructions. Your monitor  has already been registered assigning a specific monitor serial # to you.  Billing and Patient Assistance Program Information  We have supplied [employer] with any of your insurance information on file for billing purposes. [employer] offers a sliding scale Patient Assistance Program for  patients that do not have  insurance, or whose insurance does not completely cover the cost of the ZIO monitor.  You must apply for the Patient Assistance Program to qualify for this discounted rate.  To apply, please call [employer] at [phone], select option 4, select option 2, ask to  apply for  Patient Assistance Program. Meredeth will ask your household income, and how many people  are in your household. They will quote your out-of-pocket cost based on that information.  [employer] will also be able to set up a 19-month, interest-free payment plan if needed.  Applying the monitor   Shave hair from upper left chest.  Hold abrader disc by orange tab. Rub abrader in 40 strokes over the upper left chest as  indicated in your monitor instructions.  Clean area with 4 enclosed alcohol pads. Let dry.  Apply patch as indicated in monitor instructions. Patch will be placed under collarbone on left  side of chest with arrow pointing upward.  Rub patch adhesive wings for 2 minutes. Remove white label marked 1. Remove the white  label marked 2. Rub patch adhesive wings for 2 additional minutes.  While looking in a mirror, press and release button in center of patch. A small green light will  flash 3-4 times. This will be your only indicator that the monitor has been turned on.  Do not shower for the first 24 hours. You may shower after the first 24 hours.  Press the button if you feel a symptom. You will hear a small click. Record Date, Time and  Symptom in the Patient Logbook.  When you are ready to remove the patch, follow instructions on the last 2 pages of Patient  Logbook. Stick patch monitor onto the last page of Patient Logbook.  Place Patient Logbook in the blue and white box. Use locking tab on box and tape box closed  securely. The blue and white box has prepaid postage on it. Please place it in the mailbox as  soon as possible. Your physician should have your test results approximately 7 days after the  monitor has been mailed back to [facility].  Call [facility] Customer Care at [phone] if you have questions regarding  your ZIO XT patch monitor. Call them immediately if you see an orange light blinking on your  monitor.  If your monitor falls off in  less than 4 days, contact our Monitor department at [phone].  If your monitor becomes loose or falls off after 4 days call [employer] at [phone] for  suggestions on securing your monitor    Your physician has requested that you have an Exercise Stress Test. An exercise tolerance test is a test to check how your heart works during exercise. You will need to walk on a treadmill for this test. An electrocardiogram (ECG) will record your heartbeat when you are at rest and when you are exercising.    Please arrive 15 minutes prior to your appointment time for registration and insurance purposes.   The test will take approximately 45 minutes to complete.   How to prepare for your Exercise Stress Test: Do bring a list of your current medications with you.  If not listed below, you may take your medications as normal. Do wear comfortable clothes (no dresses or overalls) and walking shoes, tennis shoes preferred (no heels or open toed shoes are allowed) Do Not wear cologne, perfume, aftershave or lotions (deodorant is allowed). Do not drink or eat foods with caffeine for  24 hours before the test. (Chocolate, coffee, tea, or energy drinks) If you use an inhaler, bring it with you to the test. Do not smoke for 4 hours before the test.    If these instructions are not followed, your test will have to be rescheduled.   If you cannot keep your appointment, please provide 24 hours notification to our office, to avoid a possible $50 charge to your account.

## 2024-05-31 NOTE — Progress Notes (Unsigned)
 Enrolled for [employer] to mail a ZIO XT long term holter monitor to the patients address on file.

## 2024-06-01 ENCOUNTER — Other Ambulatory Visit: Payer: Self-pay

## 2024-06-01 ENCOUNTER — Ambulatory Visit (HOSPITAL_COMMUNITY)
Admission: EM | Admit: 2024-06-01 | Discharge: 2024-06-01 | Disposition: A | Discharge status: Home / Self Care | Attending: Internal Medicine | Admitting: Internal Medicine

## 2024-06-01 ENCOUNTER — Emergency Department (HOSPITAL_COMMUNITY)
Admission: EM | Admit: 2024-06-01 | Discharge: 2024-06-01 | Disposition: A | Discharge status: Home / Self Care | Attending: Emergency Medicine | Admitting: Emergency Medicine

## 2024-06-01 ENCOUNTER — Encounter (HOSPITAL_COMMUNITY): Payer: Self-pay | Admitting: Emergency Medicine

## 2024-06-01 DIAGNOSIS — T63301A Toxic effect of unspecified spider venom, accidental (unintentional), initial encounter: Secondary | ICD-10-CM | POA: Diagnosis not present

## 2024-06-01 DIAGNOSIS — T63301D Toxic effect of unspecified spider venom, accidental (unintentional), subsequent encounter: Secondary | ICD-10-CM

## 2024-06-01 MED ORDER — HYDROCORTISONE 2.5 % EX CREA: TOPICAL_CREAM | Freq: Two times a day (BID) | CUTANEOUS | 0 refills | Status: AC

## 2024-06-01 NOTE — ED Provider Notes (Signed)
 [facility]    CSN: [id number] Arrival date & time: 06/01/24  9187      History   Chief Complaint Chief Complaint  Patient presents with   Insect Bite    HPI Veronica [name] is a 33 y.o. female.   Veronica [name] is a 33 y.o. female presenting for chief complaint of Insect Bite to the right upper back that happened at 2:30am this morning. She believes she was bit by a brown spider. She went to the ER 30 minutes after bite happened where she was discharged home with supportive care. Insect bite was not visible on exam in the ER. Reports itching to the site of the bite. Denies fever, chills, nausea, vomiting, abdominal pain, purulent drainage from the bite, pain to the bite, and shortness of breath. No hives to the body. She has not attempted use of any OTC medications for bite itching PTA. Reports 2 episodes of non-bloody/non-mucous diarrhea this morning as well, attributes this to anxiety due to insect bite.      Past Medical History:  Diagnosis Date   Abdominal pain 01/06/2012   Acute tension headache 07/27/2019   Anemia    Ankle pain    Anxiety    Asthma    Birth control counseling 08/26/2011   BMI 40.0-44.9, adult (HCC) 02/27/2011   Dysuria 01/13/2013   Epigastric pain 09/13/2019   GERD (gastroesophageal reflux disease)    Heart murmur    Hiatal hernia    Obesity (BMI 30-39.9)    Pre-eclampsia    off med since 4/23   Secondary amenorrhea 03/25/2018   Trichomonas infection 06/17/2022    Patient Active Problem List   Diagnosis Date Noted   Ear itching 04/25/2024   Paresthesia 04/25/2024   Light headedness 03/31/2024   Panic attacks 02/13/2024   Dizziness 01/08/2024   Constipation 05/17/2023   Nocturnal oxygen desaturation 03/23/2022   Allergic rhinitis 03/07/2022   Asthma 08/31/2020   Back pain 02/13/2020   Hiatal hernia 11/02/2019   Subclinical hypothyroidism 05/19/2019   GAD (generalized anxiety disorder) 05/09/2019   Nonintractable  episodic headache 11/15/2018   Seasonal allergies 03/25/2018   Chronic cough 10/16/2017   Fatigue 05/09/2016   GERD (gastroesophageal reflux disease) 03/28/2011   BMI 40.0-44.9, adult (HCC) 02/27/2011    Past Surgical History:  Procedure Laterality Date   CESAREAN SECTION  09/02/2010   For macrosomia, but son was 7 lb 12 oz   CESAREAN SECTION N/A 10/06/2021   Procedure: CESAREAN SECTION;  Surgeon: Izell Harari, MD;  Location: [facility];  Service: Obstetrics;  Laterality: N/A;   CHOLECYSTECTOMY N/A 06/16/2022   Procedure: LAPAROSCOPIC  CHOLECYSTECTOMY;  Surgeon: Signe Mitzie LABOR, MD;  Location: [facility];  Service: General;  Laterality: N/A;   TONSILLECTOMY AND ADENOIDECTOMY      OB History     Gravida  2   Para  2   Term  2   Preterm      AB      Living  2      SAB      IAB      Ectopic      Multiple  0   Live Births  2            Home Medications    Prior to Admission medications   Medication Sig Start Date End Date Taking? Authorizing Provider  hydrocortisone 2.5 % cream Apply topically 2 (two) times daily. 06/01/24  Yes Enedelia Dorna HERO,  FNP  albuterol  (VENTOLIN  HFA) 108 (90 Base) MCG/ACT inhaler INHALE 2 PUFFS BY MOUTH EVERY 4 HOURS AS NEEDED FOR WHEEZING OR SHORTNESS OF BREATH OR COUGH. Make appointment to be seen 05/09/24   Jennelle Riis, MD  Blood Pressure KIT Please use to check blood pressure as needed. 07/15/23   Delores Suzann HERO, MD  cetirizine  (ZYRTEC ) 10 MG tablet Take 1 tablet (10 mg total) by mouth daily as needed for allergies. 04/25/24   Sowell, Brandon, MD  clonazePAM (KLONOPIN) 1 MG tablet Take 1 mg by mouth daily as needed for anxiety.    [provider]  etonogestrel  (NEXPLANON ) 68 MG IMPL implant 1 each by Subdermal route once.    [provider]  famotidine  (PEPCID ) 20 MG tablet Take 1 tablet (20 mg total) by mouth 2 (two) times daily. 04/25/24   Jennelle Riis, MD  ferrous sulfate  (FEROSUL) 325 (65 FE) MG tablet  Take 1 tablet (325 mg total) by mouth every other day. 03/04/24   Jennelle Riis, MD  FLUoxetine  (PROZAC ) 20 MG tablet Take 20 mg by mouth daily.    [provider]  fluticasone  (FLONASE ) 50 MCG/ACT nasal spray Place 2 sprays into both nostrils daily. 04/25/24   Jennelle Riis, MD  fluticasone -salmeterol (ADVAIR HFA) 115-21 MCG/ACT inhaler Inhale 2 puffs into the lungs 2 (two) times daily. 04/12/24   Kara Dorn NOVAK, MD  hydroxypropyl methylcellulose / hypromellose (ISOPTO TEARS / GONIOVISC) 2.5 % ophthalmic solution Place 1 drop into both eyes 4 (four) times daily as needed for dry eyes. 10/24/23   Raspet, Erin K, [name]  hydrOXYzine  (ATARAX ) 10 MG tablet Take 10 mg by mouth. 10/04/22   [provider]  polyethylene glycol (MIRALAX  / GLYCOLAX ) 17 g packet Take 17 g by mouth 2 (two) times daily. 05/15/23   Macario Dorothyann HERO, MD  senna (SENOKOT) 8.6 MG TABS tablet Take 1 tablet (8.6 mg total) by mouth 2 (two) times daily. 05/15/23   Macario Dorothyann HERO, MD  trimethoprim -polymyxin b  (POLYTRIM ) ophthalmic solution Place 1 drop into the left eye every 6 (six) hours. 10/24/23   Raspet, Rocky POUR, [name]    Family History Family History  Problem Relation Age of Onset   Diabetes Mother    Hypertension Mother    Obesity Mother    Heart attack Mother 31       Death   Other Sister        per-diabetic   Esophageal cancer Neg Hx    Colon cancer Neg Hx    Liver disease Neg Hx     Social History Social History   Tobacco Use   Smoking status: Never   Smokeless tobacco: Never  Vaping Use   Vaping status: Never Used  Substance Use Topics   Alcohol use: Not Currently   Drug use: Not Currently    Comment: last used in teens     Allergies   Morphine , Ibuprofen , and Morphine  and codeine    Review of Systems Review of Systems Per HPI  Physical Exam Triage Vital Signs ED Triage Vitals  Encounter Vitals Group     BP 06/01/24 0841 99/62     Girls Systolic BP Percentile --       Girls Diastolic BP Percentile --      Boys Systolic BP Percentile --      Boys Diastolic BP Percentile --      Pulse Rate 06/01/24 0841 90     Resp 06/01/24 0841 20     Temp 06/01/24  0841 99 F (37.2 C)     Temp Source 06/01/24 0841 Oral     SpO2 06/01/24 0841 96 %     Weight --      Height --      Head Circumference --      Peak Flow --      Pain Score 06/01/24 0839 6     Pain Loc --      Pain Education --      Exclude from Growth Chart --    No data found.  Updated Vital Signs BP 99/62 (BP Location: Left Arm) Comment (BP Location): large cuff  Pulse 90   Temp 99 F (37.2 C) (Oral)   Resp 20   LMP 05/29/2024   SpO2 96%   Visual Acuity Right Eye Distance:   Left Eye Distance:   Bilateral Distance:    Right Eye Near:   Left Eye Near:    Bilateral Near:     Physical Exam Vitals and nursing note reviewed.  Constitutional:      Appearance: She is not ill-appearing or toxic-appearing.  HENT:     Head: Normocephalic and atraumatic.     Right Ear: Hearing and external ear normal.     Left Ear: Hearing and external ear normal.     Nose: Nose normal.     Mouth/Throat:     Lips: Pink.   Eyes:     General: Lids are normal. Vision grossly intact. Gaze aligned appropriately.     Extraocular Movements: Extraocular movements intact.     Conjunctiva/sclera: Conjunctivae normal.   Pulmonary:     Effort: Pulmonary effort is normal.   Musculoskeletal:     Cervical back: Neck supple.   Skin:    General: Skin is warm and dry.     Capillary Refill: Capillary refill takes less than 2 seconds.     Findings: Lesion (Minimally erythematous papular lesion to the right flank as seen in image below, no drainage or swelling.) present. No rash.       Neurological:     General: No focal deficit present.     Mental Status: She is alert and oriented to person, place, and time. Mental status is at baseline.     Cranial Nerves: No dysarthria or facial asymmetry.   Psychiatric:         Mood and Affect: Mood normal.        Speech: Speech normal.        Behavior: Behavior normal.        Thought Content: Thought content normal.        Judgment: Judgment normal.    Suspected bite lesion, minimally erythematous pinpoint singular papule   UC Treatments / Results  Labs (all labs ordered are listed, but only abnormal results are displayed) Labs Reviewed - No data to display  EKG   Radiology No results found.  Procedures Procedures (including critical care time)  Medications Ordered in UC Medications - No data to display  Initial Impression / Assessment and Plan / UC Course  I have reviewed the triage vital signs and the nursing notes.  Pertinent labs & imaging results that were available during my care of the patient were reviewed by me and considered in my medical decision making (see chart for details).   1. Spider bite wound Reviewed ER note.  No signs of cellulitis/bacterial infection. Will treat with hydrocortisone 2.5% cream BID PRN for up to 7-10 days. Infection return precautions discussed.  Counseled  patient on potential for adverse effects with medications prescribed/recommended today, strict ER and return-to-clinic precautions discussed, patient verbalized understanding.    Final Clinical Impressions(s) / UC Diagnoses   Final diagnoses:  Spider bite wound, accidental or unintentional, subsequent encounter     Discharge Instructions      You have a small bite to the right upper back, this is likely from a spider that you are describing.  Use hydrocortisone cream every 12 hours as needed for itching.  Watch for signs of infection to the bite lesion such as redness, swelling, pus, and pain to the site of the bite.  If you develop any new or worsening symptoms or if your symptoms do not start to improve, please return here or follow-up with your primary care provider. If your symptoms are severe, please go to the emergency room.    ED  Prescriptions     Medication Sig Dispense Auth. Provider   hydrocortisone 2.5 % cream Apply topically 2 (two) times daily. 30 g Enedelia Dorna HERO, FNP      PDMP not reviewed this encounter.   Enedelia Dorna Handley, [place] 06/01/24 [phone]

## 2024-06-01 NOTE — Discharge Instructions (Signed)
 Continue taking your regular medications as prescribed.  If your symptoms change or worsen, please return to the ER.

## 2024-06-01 NOTE — ED Triage Notes (Signed)
 Seen in ED .  Has itching and diarrhea since leaving ED.  Has had 2 episodes of diarrhea since discharge from ED.    No medicines prescribed.   Reports a bite on right lower back, but no visible sign of issue to the area patient pointed to as location of bite

## 2024-06-01 NOTE — Discharge Instructions (Addendum)
 You have a small bite to the right upper back, this is likely from a spider that you are describing.  Use hydrocortisone cream every 12 hours as needed for itching.  Watch for signs of infection to the bite lesion such as redness, swelling, pus, and pain to the site of the bite.  If you develop any new or worsening symptoms or if your symptoms do not start to improve, please return here or follow-up with your primary care provider. If your symptoms are severe, please go to the emergency room.

## 2024-06-01 NOTE — ED Provider Notes (Signed)
 [facility] Emergency Department Provider Note MRN:  [id number]  Arrival date & time: 06/01/24     Chief Complaint   Spider Bite   History of Present Illness   Veronica [name] is a 33 y.o. year-old female presents to the ED with chief complaint of spider bite.  She states that she got out of bed to use the bathroom and when she returned she felt an itchy sensation on her back.  She states that she shined her phone light and saw a spider crawling away.  She is concerned about spider bite.  She states that she still feels a little itchy.  She states that she thinks she had a panic attack and she took her home hydroxyzine  for the panic attack.  Denies any other treatments.  History provided by patient.   Review of Systems  Pertinent positive and negative review of systems noted in HPI.    Physical Exam   Vitals:   06/01/24 0329  BP: 133/67  Pulse: (!) 102  Resp: 16  Temp: 98.6 F (37 C)  SpO2: 100%    CONSTITUTIONAL:  non toxic-appearing, NAD NEURO:  Alert and oriented x 3, CN 3-12 grossly intact EYES:  eyes equal and reactive ENT/NECK:  Supple, no stridor  CARDIO:  normal rate on my exam, regular rhythm, appears well-perfused  PULM:  No respiratory distress, CTAB GI/GU:  non-distended,  MSK/SPINE:  No gross deformities, no edema, moves all extremities  SKIN:  no rash, atraumatic, no obvious bites   *Additional and/or pertinent findings included in MDM below  Diagnostic and Interventional Summary    EKG Interpretation Date/Time:    Ventricular Rate:    PR Interval:    QRS Duration:    QT Interval:    QTC Calculation:   R Axis:      Text Interpretation:         Labs Reviewed - No data to display  No orders to display    Medications - No data to display   Procedures  /  Critical Care Procedures  ED Course and Medical Decision Making  I have reviewed the triage vital signs, the nursing notes, and pertinent available records from  the EMR.  Social Determinants Affecting Complexity of Care: Patient has no clinically significant social determinants affecting this chief complaint..   ED Course:    Medical Decision Making Patient here after being bitten by a spider.  She states that she thinks she was bitten after using the bathroom.  She noticed an itchy sensation on her back.  I do not see any obvious spider bite marks.  She is not having any wheezing or shortness of breath.  No rashes seen.  She looks well.  I do not think she needs any further emergent workup or observation.  Recommend that she continue with her hydroxyzine  as needed and daily cetirizine .         Consultants: No consultations were needed in caring for this patient.   Treatment and Plan: Emergency department workup does not suggest an emergent condition requiring admission or immediate intervention beyond  what has been performed at this time. The patient is safe for discharge and has  been instructed to return immediately for worsening symptoms, change in  symptoms or any other concerns    Final Clinical Impressions(s) / ED Diagnoses     ICD-10-CM   1. Spider bite wound, accidental or unintentional, initial encounter  T63.301A       ED  Discharge Orders     None         Discharge Instructions Discussed with and Provided to Patient:     Discharge Instructions      Continue taking your regular medications as prescribed.  If your symptoms change or worsen, please return to the ER.       Vicky Charleston, [name] 06/01/24 9383    Griselda Norris, MD 06/01/24 [phone]

## 2024-06-01 NOTE — ED Triage Notes (Signed)
 Patient reports spider bite at lower back this morning with mild itching , no oral swelling , respirations unlabored/airway intact , afebrile .

## 2024-06-02 ENCOUNTER — Ambulatory Visit (INDEPENDENT_AMBULATORY_CARE_PROVIDER_SITE_OTHER): Admitting: Family Medicine

## 2024-06-02 VITALS — BP 118/73 | HR 71 | Ht 62.0 in | Wt 232.0 lb

## 2024-06-02 DIAGNOSIS — L299 Pruritus, unspecified: Secondary | ICD-10-CM

## 2024-06-02 NOTE — Progress Notes (Signed)
    SUBJECTIVE:   CHIEF COMPLAINT / HPI:   Insect bite Saw a spider in her bed after she felt an itching and pain sensation at her lower back. Went to the ED for this, and they told her not to worry. She then went to the UC after that and gave a hydrocortisone  cream. The cream is not working. She is continuing to itch all over. She is taking zyrtec  10 mg daily as well as pepcid  20 mg BID. She has hydroxyzine , but she only took this when she had a panic attack at 2 AM yesterday. No improvement in the itching with this dose. No new travel. No new medications. No chance of pregnant given nexplanon  and last sex March 2025. No SOB outside of anxiety.  PERTINENT  PMH / PSH: anxiety  OBJECTIVE:   BP 118/73   Pulse 71   Ht 5' 2 (1.575 m)   Wt 232 lb (105.2 kg)   LMP 05/29/2024   SpO2 99%   BMI 42.43 kg/m   General: Alert and oriented, in NAD Skin: Warm, dry, and intact without appreciable bites, lesions, rashes over the entire torso, back, arms HEENT: NCAT, EOM grossly normal, midline nasal septum Cardiac: RRR, no m/r/g appreciated Respiratory: CTAB, breathing and speaking comfortably on RA Abdominal: Soft, nontender, nondistended, normoactive bowel sounds Extremities: Moves all extremities grossly equally Neurological: No gross focal deficit Psychiatric: Understandably anxious appearing  ASSESSMENT/PLAN:   Assessment & Plan Generalized pruritus Unlikely due to insect bite due to generalized nature and no observed lesions.  No eczematous or psoriatic plaques.  Unclear etiology overall; will further evaluate with CBC to evaluate polycythemia, CMP to evaluate kidney/liver function, and TSH to evaluate thyroid  dysfunction.  In the meantime, advised to increase her Zyrtec  to twice daily dosing.  Continue famotidine  twice daily.  Okay to also continue hydroxyzine  for anxiety prn at this time.  Advised to let us  know in 1 week if she is not improved.   Stuart Redo, MD [facility]

## 2024-06-02 NOTE — Patient Instructions (Signed)
 We will obtain labs today. I will follow up these results with you.  In the meantime, take zyrtec  twice a day with your famotidine . Only take the hydroxyzine  as needed for anxiety.  Let me know if you continue to have symptoms and do not improve with this.

## 2024-06-03 ENCOUNTER — Ambulatory Visit: Payer: Self-pay | Admitting: Family Medicine

## 2024-06-03 DIAGNOSIS — L299 Pruritus, unspecified: Secondary | ICD-10-CM | POA: Insufficient documentation

## 2024-06-03 LAB — CBC WITH DIFFERENTIAL/PLATELET
Basophils Absolute: 0 10*3/uL (ref 0.0–0.2)
Basos: 0 %
EOS (ABSOLUTE): 0.1 10*3/uL (ref 0.0–0.4)
Eos: 1 %
Hematocrit: 40.3 % (ref 34.0–46.6)
Hemoglobin: 12.9 g/dL (ref 11.1–15.9)
Immature Grans (Abs): 0 10*3/uL (ref 0.0–0.1)
Immature Granulocytes: 0 %
Lymphocytes Absolute: 2.9 10*3/uL (ref 0.7–3.1)
Lymphs: 39 %
MCH: 28.2 pg (ref 26.6–33.0)
MCHC: 32 g/dL (ref 31.5–35.7)
MCV: 88 fL (ref 79–97)
Monocytes Absolute: 0.3 10*3/uL (ref 0.1–0.9)
Monocytes: 5 %
Neutrophils Absolute: 4.1 10*3/uL (ref 1.4–7.0)
Neutrophils: 55 %
Platelets: 368 10*3/uL (ref 150–450)
RBC: 4.58 x10E6/uL (ref 3.77–5.28)
RDW: 13.1 % (ref 11.7–15.4)
WBC: 7.4 10*3/uL (ref 3.4–10.8)

## 2024-06-03 LAB — COMPREHENSIVE METABOLIC PANEL WITH GFR
ALT: 15 IU/L (ref 0–32)
AST: 10 IU/L (ref 0–40)
Albumin: 4.3 g/dL (ref 3.9–4.9)
Alkaline Phosphatase: 80 IU/L (ref 44–121)
BUN/Creatinine Ratio: 14 (ref 9–23)
BUN: 15 mg/dL (ref 6–20)
Bilirubin Total: 0.6 mg/dL (ref 0.0–1.2)
CO2: 21 mmol/L (ref 20–29)
Calcium: 9.6 mg/dL (ref 8.7–10.2)
Chloride: 101 mmol/L (ref 96–106)
Creatinine, Ser: 1.07 mg/dL — ABNORMAL HIGH (ref 0.57–1.00)
Globulin, Total: 3 g/dL (ref 1.5–4.5)
Glucose: 99 mg/dL (ref 70–99)
Potassium: 4.6 mmol/L (ref 3.5–5.2)
Sodium: 138 mmol/L (ref 134–144)
Total Protein: 7.3 g/dL (ref 6.0–8.5)
eGFR: 71 mL/min/{1.73_m2} (ref >59)

## 2024-06-03 LAB — TSH RFX ON ABNORMAL TO FREE T4: TSH: 1.79 u[IU]/mL (ref 0.450–4.500)

## 2024-06-03 NOTE — Assessment & Plan Note (Signed)
 Unlikely due to insect bite due to generalized nature and no observed lesions.  No eczematous or psoriatic plaques.  Unclear etiology overall; will further evaluate with CBC to evaluate polycythemia, CMP to evaluate kidney/liver function, and TSH to evaluate thyroid  dysfunction.  In the meantime, advised to increase her Zyrtec  to twice daily dosing.  Continue famotidine  twice daily.  Okay to also continue hydroxyzine  for anxiety prn at this time.  Advised to let us  know in 1 week if she is not improved.

## 2024-06-07 ENCOUNTER — Other Ambulatory Visit: Payer: Self-pay

## 2024-06-07 ENCOUNTER — Encounter (HOSPITAL_COMMUNITY): Payer: Self-pay | Admitting: Emergency Medicine

## 2024-06-07 ENCOUNTER — Ambulatory Visit (INDEPENDENT_AMBULATORY_CARE_PROVIDER_SITE_OTHER)

## 2024-06-07 ENCOUNTER — Ambulatory Visit (HOSPITAL_COMMUNITY): Admission: EM | Admit: 2024-06-07 | Discharge: 2024-06-07 | Disposition: A | Discharge status: Home / Self Care

## 2024-06-07 DIAGNOSIS — R0602 Shortness of breath: Secondary | ICD-10-CM

## 2024-06-07 DIAGNOSIS — J4541 Moderate persistent asthma with (acute) exacerbation: Secondary | ICD-10-CM

## 2024-06-07 MED ORDER — ALBUTEROL SULFATE HFA 108 (90 BASE) MCG/ACT IN AERS: INHALATION_SPRAY | RESPIRATORY_TRACT | 1 refills | Status: DC

## 2024-06-07 NOTE — ED Triage Notes (Signed)
 Yesterday pt had SOB, heart was pounding and anxiety got up. Reports that when goes out in heat the SOB will happen, but will be fine when in cool breathing gets better.  Has mild asthma and hasn't had to use inhaler in 3 months patient states.SABRA

## 2024-06-07 NOTE — ED Provider Notes (Signed)
 [facility] [place]  Note:  This document was prepared using Dragon voice recognition software and may include unintentional dictation errors.  MRN: [id number] DOB: 07/25/1991  Subjective:   Veronica [name] is a 33 y.o. female presenting for patient reports shortness of breath, palpitations, anxiety since yesterday.  Patient reports that when she goes out into the heat and humidity she develops dyspnea and states that it improves when she comes back into a cool environment.  Patient has past history of asthma but is currently not using any medication for treatment.  Patient was prescribed salmeterol fluticasone  daily inhaler but has not started using it because she does not know what it is for.  Patient also has albuterol  inhaler and has been using that since yesterday with mild improvement.  Patient was unsure if she is actually using medication correctly.  Patient was told by her pulmonologist that they were not sure if she still had asthma because her symptoms have been controlled.  Patient currently having no chest pain, shortness of breath, weakness, dizziness.  No current facility-administered medications for this encounter.  Current Outpatient Medications:    albuterol  (VENTOLIN  HFA) 108 (90 Base) MCG/ACT inhaler, INHALE 2 PUFFS BY MOUTH EVERY 4 HOURS AS NEEDED FOR WHEEZING OR SHORTNESS OF BREATH OR COUGH. Make appointment to be seen, Disp: 18 g, Rfl: 1   Blood Pressure KIT, Please use to check blood pressure as needed., Disp: 1 kit, Rfl: 0   cetirizine  (ZYRTEC ) 10 MG tablet, Take 1 tablet (10 mg total) by mouth daily as needed for allergies., Disp: 30 tablet, Rfl: 11   clonazePAM (KLONOPIN) 1 MG tablet, Take 1 mg by mouth daily as needed for anxiety., Disp: , Rfl:    etonogestrel  (NEXPLANON ) 68 MG IMPL implant, 1 each by Subdermal route once., Disp: , Rfl:    famotidine  (PEPCID ) 20 MG tablet, Take 1 tablet (20 mg total) by mouth 2 (two) times daily., Disp: 60 tablet, Rfl: 2    ferrous sulfate  (FEROSUL) 325 (65 FE) MG tablet, Take 1 tablet (325 mg total) by mouth every other day., Disp: 30 tablet, Rfl: 2   FLUoxetine  (PROZAC ) 20 MG tablet, Take 20 mg by mouth daily., Disp: , Rfl:    fluticasone  (FLONASE ) 50 MCG/ACT nasal spray, Place 2 sprays into both nostrils daily., Disp: 16 g, Rfl: 6   fluticasone -salmeterol (ADVAIR HFA) 115-21 MCG/ACT inhaler, Inhale 2 puffs into the lungs 2 (two) times daily., Disp: 1 each, Rfl: 12   hydrocortisone  2.5 % cream, Apply topically 2 (two) times daily., Disp: 30 g, Rfl: 0   hydroxypropyl methylcellulose / hypromellose (ISOPTO TEARS / GONIOVISC) 2.5 % ophthalmic solution, Place 1 drop into both eyes 4 (four) times daily as needed for dry eyes., Disp: 15 mL, Rfl: 0   hydrOXYzine  (ATARAX ) 10 MG tablet, Take 10 mg by mouth., Disp: , Rfl:    polyethylene glycol (MIRALAX  / GLYCOLAX ) 17 g packet, Take 17 g by mouth 2 (two) times daily., Disp: 14 each, Rfl: 1   senna (SENOKOT) 8.6 MG TABS tablet, Take 1 tablet (8.6 mg total) by mouth 2 (two) times daily., Disp: 120 tablet, Rfl: 0   trimethoprim -polymyxin b  (POLYTRIM ) ophthalmic solution, Place 1 drop into the left eye every 6 (six) hours., Disp: 10 mL, Rfl: 0   Allergies  Allergen Reactions   Morphine  Anaphylaxis   Ibuprofen  Nausea And Vomiting    Was told to not take this because it would flare stomach issues   Morphine  And Codeine  Other (See  Comments)    Causes chest to be heavy    Past Medical History:  Diagnosis Date   Abdominal pain 01/06/2012   Acute tension headache 07/27/2019   Anemia    Ankle pain    Anxiety    Asthma    Birth control counseling 08/26/2011   BMI 40.0-44.9, adult (HCC) 02/27/2011   Dysuria 01/13/2013   Epigastric pain 09/13/2019   GERD (gastroesophageal reflux disease)    Heart murmur    Hiatal hernia    Obesity (BMI 30-39.9)    Pre-eclampsia    off med since 4/23   Secondary amenorrhea 03/25/2018   Trichomonas infection 06/17/2022     Past  Surgical History:  Procedure Laterality Date   CESAREAN SECTION  09/02/2010   For macrosomia, but son was 7 lb 12 oz   CESAREAN SECTION N/A 10/06/2021   Procedure: CESAREAN SECTION;  Surgeon: Izell Harari, MD;  Location: [facility];  Service: Obstetrics;  Laterality: N/A;   CHOLECYSTECTOMY N/A 06/16/2022   Procedure: LAPAROSCOPIC  CHOLECYSTECTOMY;  Surgeon: Signe Mitzie LABOR, MD;  Location: [facility]S;  Service: General;  Laterality: N/A;   TONSILLECTOMY AND ADENOIDECTOMY      Family History  Problem Relation Age of Onset   Diabetes Mother    Hypertension Mother    Obesity Mother    Heart attack Mother 39       Death   Other Sister        per-diabetic   Esophageal cancer Neg Hx    Colon cancer Neg Hx    Liver disease Neg Hx     Social History   Tobacco Use   Smoking status: Never   Smokeless tobacco: Never  Vaping Use   Vaping status: Never Used  Substance Use Topics   Alcohol use: Not Currently   Drug use: Not Currently    Comment: last used in teens    ROS Refer to HPI for ROS details.  Objective:   Vitals: BP 118/75 (BP Location: Left Arm)   Pulse 86   Temp 98.9 F (37.2 C) (Oral)   Resp 17   LMP 05/29/2024   SpO2 97%   Physical Exam Vitals and nursing note reviewed.  Constitutional:      General: She is not in acute distress.    Appearance: Normal appearance. She is well-developed. She is not ill-appearing or toxic-appearing.  HENT:     Head: Normocephalic and atraumatic.     Nose: Nose normal.     Mouth/Throat:     Mouth: Mucous membranes are moist.   Cardiovascular:     Rate and Rhythm: Normal rate and regular rhythm.     Heart sounds: Normal heart sounds. No murmur heard. Pulmonary:     Effort: Pulmonary effort is normal. No respiratory distress.     Breath sounds: Normal breath sounds. No stridor. No wheezing, rhonchi or rales.  Chest:     Chest wall: No tenderness.   Skin:    General: Skin is warm and dry.   Neurological:     General:  No focal deficit present.     Mental Status: She is alert and oriented to person, place, and time.   Psychiatric:        Mood and Affect: Mood normal.        Behavior: Behavior normal.     Procedures  No results found for this or any previous visit (from the past 24 hours).  DG Chest 2 View Result Date: 06/07/2024 CLINICAL  DATA:  Shortness of breath EXAM: CHEST - 2 VIEW COMPARISON:  03/09/2024 FINDINGS: The heart size and mediastinal contours are within normal limits. Both lungs are clear. The visualized skeletal structures are unremarkable. Rounded external metallic artifact over left chest IMPRESSION: No active cardiopulmonary disease. Electronically Signed   By: Luke Bun M.D.   On: 06/07/2024 16:06     Assessment and Plan :     Discharge Instructions       1. Moderate persistent asthma with exacerbation (Primary) - ED EKG performed in UC shows normal sinus rhythm with a ventricular rate of 84 bpm, normal EKG.  No STEMI. - DG Chest 2 View x-ray performed in UC shows no acute cardiopulmonary processes, no sign of consolidation or pneumonia, normal  aeration to bilateral lungs.  Normal chest x-ray. - albuterol  (VENTOLIN  HFA) 108 (90 Base) MCG/ACT inhaler; INHALE 2 PUFFS BY MOUTH EVERY 4 HOURS AS NEEDED FOR WHEEZING OR SHORTNESS OF BREATH OR COUGH.  Dispense: 18 g; Refill: 1 - Use previously prescribed fluticasone  salmeterol inhaler twice daily as directed for asthma control. -Continue to monitor symptoms for any change in severity if there is any escalation of current symptoms or development of new symptoms follow-up in UC or ER for further evaluation and management.      Gonsalo Cuthbertson B Naela Nodal   Jaime Dome, [place] B, [place] 06/07/24 1645

## 2024-06-07 NOTE — ED Notes (Signed)
 Pt given cup water  to sip on for PO fluid challenge

## 2024-06-07 NOTE — Discharge Instructions (Addendum)
  1. Moderate persistent asthma with exacerbation (Primary) - ED EKG performed in UC shows normal sinus rhythm with a ventricular rate of 84 bpm, normal EKG.  No STEMI. - DG Chest 2 View x-ray performed in UC shows no acute cardiopulmonary processes, no sign of consolidation or pneumonia, normal  aeration to bilateral lungs.  Normal chest x-ray. - albuterol  (VENTOLIN  HFA) 108 (90 Base) MCG/ACT inhaler; INHALE 2 PUFFS BY MOUTH EVERY 4 HOURS AS NEEDED FOR WHEEZING OR SHORTNESS OF BREATH OR COUGH.  Dispense: 18 g; Refill: 1 - Use previously prescribed fluticasone  salmeterol inhaler twice daily as directed for asthma control. -Continue to monitor symptoms for any change in severity if there is any escalation of current symptoms or development of new symptoms follow-up in UC or ER for further evaluation and management.

## 2024-06-09 ENCOUNTER — Ambulatory Visit: Admitting: Family Medicine

## 2024-06-09 VITALS — BP 123/87 | HR 83 | Ht 62.0 in | Wt 228.0 lb

## 2024-06-09 DIAGNOSIS — J069 Acute upper respiratory infection, unspecified: Secondary | ICD-10-CM | POA: Diagnosis present

## 2024-06-09 NOTE — Patient Instructions (Signed)
 Good to see you today - Thank you for coming in  Things we discussed today:  1) Your symptoms are most likely due to a viral illness. These usually last about 7 days and resolve on their own. They do cause muscles cramps which could explain your chest tightness. It also worsens heartburn and asthma.  2) You can take Tums 2-3 tablets every 4-6 hours as needed for heartburn. It is safe to take this with your Pepcid .  3) You can take tylenol  1000mg  every 6-12 hours for the next 5 days to help with muscle aches and discomfort.  4) Make sure you stay very hydrated. It is easy to get dehydrated with viral illnesses.

## 2024-06-09 NOTE — Progress Notes (Signed)
    SUBJECTIVE:   CHIEF COMPLAINT / HPI:   Veronica [name] is a 33yo F w/ hx of anxiety, GERD, asthma that pf GERD f/u - At start of week, pt reports having some nausea, chest soreness/tightness, and worsening of her chronic GERD sx's. - She also had an episode of loose stool today - No fevers - She is still taking famotidine  as prescribed, but is worried that she is still having heartburn sx's     OBJECTIVE:   LMP 05/29/2024   General: Alert, pleasant woman. NAD. HEENT: NCAT. MMM. CV: RRR, no murmurs. Cap refill <2. Resp: CTAB, no wheezing or crackles. Normal WOB on RA.  Abm: Soft, nontender, nondistended. BS present. Ext: Moves all ext spontaneously Skin: Warm, well perfused   ASSESSMENT/PLAN:   Assessment & Plan Viral URI Suspect pt had viral URI, which is worsening her underlying GERD and causing some myalgia (chest tightness). VSS, lung and cardiac exam benign. Provided reassurance - Supportive management - Can take Tums in addition to famotidine  - Encopuraged to cont Advair BID to control underlying asthma     Veronica Nearing, MD [facility]

## 2024-06-11 ENCOUNTER — Other Ambulatory Visit: Payer: Self-pay

## 2024-06-11 ENCOUNTER — Emergency Department (HOSPITAL_COMMUNITY)

## 2024-06-11 ENCOUNTER — Emergency Department (HOSPITAL_COMMUNITY)
Admission: EM | Admit: 2024-06-11 | Discharge: 2024-06-11 | Disposition: A | Discharge status: Home / Self Care | Attending: Emergency Medicine | Admitting: Emergency Medicine

## 2024-06-11 ENCOUNTER — Encounter (HOSPITAL_COMMUNITY): Payer: Self-pay | Admitting: Emergency Medicine

## 2024-06-11 DIAGNOSIS — R0602 Shortness of breath: Secondary | ICD-10-CM | POA: Diagnosis not present

## 2024-06-11 DIAGNOSIS — J45909 Unspecified asthma, uncomplicated: Secondary | ICD-10-CM | POA: Diagnosis not present

## 2024-06-11 DIAGNOSIS — Z7951 Long term (current) use of inhaled steroids: Secondary | ICD-10-CM | POA: Insufficient documentation

## 2024-06-11 LAB — BASIC METABOLIC PANEL WITH GFR
Anion gap: 10 (ref 5–15)
BUN: 8 mg/dL (ref 6–20)
CO2: 23 mmol/L (ref 22–32)
Calcium: 9.2 mg/dL (ref 8.9–10.3)
Chloride: 104 mmol/L (ref 98–111)
Creatinine, Ser: 1.09 mg/dL — ABNORMAL HIGH (ref 0.44–1.00)
GFR, Estimated: >60 mL/min (ref >60)
Glucose, Bld: 101 mg/dL — ABNORMAL HIGH (ref 70–99)
Potassium: 3.7 mmol/L (ref 3.5–5.1)
Sodium: 137 mmol/L (ref 135–145)

## 2024-06-11 LAB — CBC
HCT: 38 % (ref 36.0–46.0)
Hemoglobin: 12.6 g/dL (ref 12.0–15.0)
MCH: 28.3 pg (ref 26.0–34.0)
MCHC: 33.2 g/dL (ref 30.0–36.0)
MCV: 85.2 fL (ref 80.0–100.0)
Platelets: 304 K/uL (ref 150–400)
RBC: 4.46 MIL/uL (ref 3.87–5.11)
RDW: 14 % (ref 11.5–15.5)
WBC: 8.2 K/uL (ref 4.0–10.5)
nRBC: 0 % (ref 0.0–0.2)

## 2024-06-11 LAB — TROPONIN I (HIGH SENSITIVITY): Troponin I (High Sensitivity): 2 ng/L (ref <18)

## 2024-06-11 LAB — HCG, SERUM, QUALITATIVE: Preg, Serum: NEGATIVE

## 2024-06-11 MED ORDER — IPRATROPIUM-ALBUTEROL 0.5-2.5 (3) MG/3ML IN SOLN
3.0000 mL | Freq: Once | RESPIRATORY_TRACT | Status: AC
  Administered 2024-06-11: 3 mL via RESPIRATORY_TRACT
  Filled 2024-06-11: qty 3

## 2024-06-11 MED ORDER — PREDNISONE 10 MG PO TABS: 40.0000 mg | ORAL_TABLET | Freq: Every day | ORAL | 0 refills | Status: AC

## 2024-06-11 NOTE — ED Provider Notes (Signed)
 [place] EMERGENCY DEPARTMENT AT [facility] Provider Note   CSN: [id number] Arrival date & time: 06/11/24  1322     Patient presents with: Shortness of Breath   Veronica [name] is a 33 y.o. female presenting to ED with SOB for 4 days.  Hx of anxiety and asthma.  Deeps dyspnea only on exertion and outside in warm weather, not at rest.  No chest pain, fevers, small amount of coughing.  Given albuteral inhaler by UC but hasn't used it today.   HPI     Prior to Admission medications   Medication Sig Start Date End Date Taking? Authorizing Provider  predniSONE  (DELTASONE ) 10 MG tablet Take 4 tablets (40 mg total) by mouth daily with breakfast for 5 days. 06/11/24 06/16/24 Yes Jeramia Saleeby, Donnice PARAS, MD  albuterol  (VENTOLIN  HFA) 108 [phone] Base) MCG/ACT inhaler INHALE 2 PUFFS BY MOUTH EVERY 4 HOURS AS NEEDED FOR WHEEZING OR SHORTNESS OF BREATH OR COUGH. Make appointment to be seen 06/07/24   Reddick, Johnathan B, NP  Blood Pressure KIT Please use to check blood pressure as needed. 07/15/23   Delores Suzann HERO, MD  cetirizine  (ZYRTEC ) 10 MG tablet Take 1 tablet (10 mg total) by mouth daily as needed for allergies. 04/25/24   Sowell, Brandon, MD  clonazePAM (KLONOPIN) 1 MG tablet Take 1 mg by mouth daily as needed for anxiety.    [provider]  etonogestrel  (NEXPLANON ) 68 MG IMPL implant 1 each by Subdermal route once.    [provider]  famotidine  (PEPCID ) 20 MG tablet Take 1 tablet (20 mg total) by mouth 2 (two) times daily. 04/25/24   Jennelle Riis, MD  ferrous sulfate  (FEROSUL) 325 (65 FE) MG tablet Take 1 tablet (325 mg total) by mouth every other day. 03/04/24   Jennelle Riis, MD  FLUoxetine  (PROZAC ) 20 MG tablet Take 20 mg by mouth daily.    [provider]  fluticasone  (FLONASE ) 50 MCG/ACT nasal spray Place 2 sprays into both nostrils daily. 04/25/24   Jennelle Riis, MD  fluticasone -salmeterol (ADVAIR HFA) 115-21 MCG/ACT inhaler Inhale 2 puffs into the lungs  2 (two) times daily. 04/12/24   Kara Dorn NOVAK, MD  hydrocortisone  2.5 % cream Apply topically 2 (two) times daily. 06/01/24   Enedelia Dorna HERO, FNP  hydroxypropyl methylcellulose / hypromellose (ISOPTO TEARS / GONIOVISC) 2.5 % ophthalmic solution Place 1 drop into both eyes 4 (four) times daily as needed for dry eyes. 10/24/23   Raspet, Rocky POUR, [name]  hydrOXYzine  (ATARAX ) 10 MG tablet Take 10 mg by mouth. 10/04/22   [provider]  polyethylene glycol (MIRALAX  / GLYCOLAX ) 17 g packet Take 17 g by mouth 2 (two) times daily. 05/15/23   Macario Dorothyann HERO, MD  senna (SENOKOT) 8.6 MG TABS tablet Take 1 tablet (8.6 mg total) by mouth 2 (two) times daily. 05/15/23   Macario Dorothyann HERO, MD  trimethoprim -polymyxin b  (POLYTRIM ) ophthalmic solution Place 1 drop into the left eye every 6 (six) hours. 10/24/23   Raspet, Rocky POUR, [name]    Allergies: Morphine , Ibuprofen , and Morphine  and codeine     Review of Systems  Updated Vital Signs BP 127/81 (BP Location: Right Arm)   Pulse (!) 101   Temp 97.8 F (36.6 C)   Resp (!) 28   LMP 05/29/2024   SpO2 100%   Physical Exam Constitutional:      General: She is not in acute distress. HENT:     Head: Normocephalic and atraumatic.  Eyes:  Conjunctiva/sclera: Conjunctivae normal.     Pupils: Pupils are equal, round, and reactive to light.  Cardiovascular:     Rate and Rhythm: Normal rate and regular rhythm.  Pulmonary:     Effort: Pulmonary effort is normal. No respiratory distress.  Abdominal:     General: There is no distension.     Tenderness: There is no abdominal tenderness.  Skin:    General: Skin is warm and dry.  Neurological:     General: No focal deficit present.     Mental Status: She is alert. Mental status is at baseline.  Psychiatric:        Mood and Affect: Mood normal.        Behavior: Behavior normal.     (all labs ordered are listed, but only abnormal results are displayed) Labs Reviewed  BASIC METABOLIC PANEL  WITH GFR - Abnormal; Notable for the following components:      Result Value   Glucose, Bld 101 (*)    Creatinine, Ser 1.09 (*)    All other components within normal limits  CBC  HCG, SERUM, QUALITATIVE  TROPONIN I (HIGH SENSITIVITY)    EKG: EKG Interpretation Date/Time:  Saturday June 11 2024 13:31:53 EDT Ventricular Rate:  96 PR Interval:  176 QRS Duration:  86 QT Interval:  356 QTC Calculation: 449 R Axis:   6  Text Interpretation: Normal sinus rhythm Normal ECG When compared with ECG of 07-Jun-2024 15:51, PREVIOUS ECG IS PRESENT Confirmed by Cottie Cough [phone]) on 06/11/2024 2:26:10 PM  Radiology: DG Chest 2 View Result Date: 06/11/2024 CLINICAL DATA:  Short of breath EXAM: CHEST - 2 VIEW COMPARISON:  Radiographs 06/07/2024 FINDINGS: Normal mediastinum and cardiac silhouette. Normal pulmonary vasculature. No evidence of effusion, infiltrate, or pneumothorax. No acute bony abnormality. Rounded metallic coin like electric device over the medial LEFT chest again noted IMPRESSION: No acute cardiopulmonary process. Electronically Signed   By: Jackquline Boxer M.D.   On: 06/11/2024 14:40     Procedures   Medications Ordered in the ED  ipratropium-albuterol  (DUONEB) 0.5-2.5 (3) MG/3ML nebulizer solution 3 mL (3 mLs Nebulization Given 06/11/24 1503)                                    Medical Decision Making Amount and/or Complexity of Data Reviewed Labs: ordered. Radiology: ordered.  Risk Prescription drug management.   SOB Ddx includes anxiety vs asthma vs PNA vs PTX vs other  No risk factors for PE - HR 90's, no hypoxia, no leg swelling - doubt acute PE  No significant wheezing but will try duoneb as pt reports breathing worse in warm weather and has hx of asthma  Strong anxiety component likely.  Pt on hydroxyzine  and clonazepam for her anxiety.   Labs, ecg, xray reviewed - no emergent findings.   Doubt ACS ECG per my interpretation is NSR no acute  ischemia  Reassessed and feeling better after nebulizer  She has an inhaler at home, we'll try 5 days of prednisone  as well.   Okay for discharge after breathing treatment.     Final diagnoses:  Shortness of breath    ED Discharge Orders          Ordered    predniSONE  (DELTASONE ) 10 MG tablet  Daily with breakfast        06/11/24 1535  Cottie Donnice PARAS, MD 06/11/24 1536

## 2024-06-11 NOTE — ED Triage Notes (Signed)
 Pt reports SHOB and chest pain on and off. PT reports she thinks this is from my anxiety. Denies cough or fevers.

## 2024-06-11 NOTE — ED Notes (Signed)
 Patient Alert and oriented to baseline. Stable and ambulatory to baseline. Patient verbalized understanding of the discharge instructions.  Patient belongings were taken by the patient.

## 2024-06-16 ENCOUNTER — Ambulatory Visit (INDEPENDENT_AMBULATORY_CARE_PROVIDER_SITE_OTHER)

## 2024-06-16 VITALS — BP 123/76 | HR 66 | Ht 62.0 in | Wt 225.4 lb

## 2024-06-16 DIAGNOSIS — K219 Gastro-esophageal reflux disease without esophagitis: Secondary | ICD-10-CM

## 2024-06-16 DIAGNOSIS — J4521 Mild intermittent asthma with (acute) exacerbation: Secondary | ICD-10-CM

## 2024-06-16 DIAGNOSIS — J301 Allergic rhinitis due to pollen: Secondary | ICD-10-CM | POA: Diagnosis not present

## 2024-06-16 DIAGNOSIS — L819 Disorder of pigmentation, unspecified: Secondary | ICD-10-CM | POA: Diagnosis not present

## 2024-06-16 DIAGNOSIS — K5900 Constipation, unspecified: Secondary | ICD-10-CM

## 2024-06-16 MED ORDER — POLYETHYLENE GLYCOL 3350 17 G PO PACK: 17.0000 g | PACK | Freq: Two times a day (BID) | ORAL | 1 refills | Status: DC

## 2024-06-16 MED ORDER — AZELASTINE HCL 0.1 % NA SOLN: 2.0000 | Freq: Two times a day (BID) | NASAL | 3 refills | Status: DC

## 2024-06-16 MED ORDER — ESOMEPRAZOLE MAGNESIUM 20 MG PO CPDR: 20.0000 mg | DELAYED_RELEASE_CAPSULE | Freq: Every day | ORAL | 2 refills | Status: DC

## 2024-06-16 NOTE — Assessment & Plan Note (Signed)
 I suspect this is contributing to her shortness of breath.  I encouraged patient to call pulmonology and follow-up with them for management of her asthma.  Provided the practice name and number of the doctor she previously saw.  Continue on daily Advair and albuterol  as needed, as previously prescribed

## 2024-06-16 NOTE — Progress Notes (Signed)
 SUBJECTIVE:   CHIEF COMPLAINT / HPI:   SOB She presents for an ED & urgent care visit follow up for SOB. She was Given albuterol  inhaler by UC and 5 day course of prednisone  by ED. SOB since last Tuesday. SOB worse with eating and outside in the heat. Exertional SOB as well.  Episodes of shortness of breath last 5-10 minutes.  Shortness of breath episodes during mealtime are associated with esophageal burning. No heart racing. She gets anxious and lightheadedness after SOB.  Advair causes shortness of breath Albuterol  last used 3 days ago Breathing exercises (box breathing) improves her symptoms.   GERD Reports compliance with famotidine  20 mg twice daily. Symptoms are not managed well at this time. Burning/reflux with every meal/eating.  Cannot tolerate omeprazole  due to headaches and lightheaded Pantoprazole  was effective for 2 years but then stopped working. She has not had any unexpected weight loss. She does not report any vomiting.  She says it feels like food is stuck in her throat. She has seen GI in the past and has had an EGD that was normal aside from a hiatal hernia in 2020.  Anxiety  Has used Klonopin 1-2 times per week.  Also taking Atarax  and Prozac  Thinks some nervousness may be contributing to her SOB.  Perioral hyperpigmentation Has noticed increased pigmentation in the nasolabial folds.  No associated symptoms.  Has been applying cocoa butter without benefit  In passing at the end of the visit, she asked me to look in her ears as she has been rubbing her ears a lot and was wondering if she has a lot of wax in her ears.  She did not report any pain or hearing concerns.  PERTINENT  PMH / PSH: asthma, GERD, anxiety  OBJECTIVE:   Ht 5' 2 (1.575 m)   Wt 225 lb 6 oz (102.2 kg)   LMP 05/29/2024   BMI 41.22 kg/m    General: Obese, well-appearing female in no acute distress HEENT: hyperpigmentation of the nasolabial folds, no mucosal lesions or  discoloration Cardiovascular: Normal rate, no murmurs rubs or gallops Pulmonary: No increased work of breathing, lungs clear to auscultation, no wheezes or rhonchi Abdominal: Nontender diffusely  ASSESSMENT/PLAN:   Assessment & Plan Constipation, unspecified constipation type Refilled Miralax    Mild intermittent asthma with acute exacerbation I suspect this is contributing to her shortness of breath.  I encouraged patient to call pulmonology and follow-up with them for management of her asthma.  Provided the practice name and number of the doctor she previously saw.  Continue on daily Advair and albuterol  as needed, as previously prescribed Gastroesophageal reflux disease, unspecified whether esophagitis present Continue with famotidine  20 mg twice daily.  Patient previously did well on pantoprazole  until it was ineffective after 2 years.  She did not tolerate omeprazole  in the past discussed restarting pantoprazole  today, but patient elected to start a different PPI.  Prescription for esomeprazole  20 mg daily provided to be taken in addition to Pepcid .  Allergic rhinitis due to pollen, unspecified seasonality I suspect this also has a component in her shortness of breath.  Patient will continue with Zyrtec  as previously prescribed.  Discontinue fluticasone .  Provided a prescription for azelastine  2 sprays per nare twice daily. Perioral hyperpigmentation Differential diagnosis includes perioral dermatitis versus hyperpigmentation from the sun.  As cocoa butter has not been helpful, I recommended she apply Vaseline.  Also encouraged use of sunscreen when outdoors.  She will follow-up in 1 month for clinical  reassessment.  Consider ear exam at follow up, per patient's request today.  I deferred this exam at this time due to no red flag symptoms and other more pressing concerns.  Veronica [name] Veronica Morrison, MD [facility]

## 2024-06-16 NOTE — Assessment & Plan Note (Signed)
 Differential diagnosis includes perioral dermatitis versus hyperpigmentation from the sun.  As cocoa butter has not been helpful, I recommended she apply Vaseline.  Also encouraged use of sunscreen when outdoors.

## 2024-06-16 NOTE — Assessment & Plan Note (Signed)
 Continue with famotidine  20 mg twice daily.  Patient previously did well on pantoprazole  until it was ineffective after 2 years.  She did not tolerate omeprazole  in the past discussed restarting pantoprazole  today, but patient elected to start a different PPI.  Prescription for esomeprazole  20 mg daily provided to be taken in addition to Pepcid .

## 2024-06-16 NOTE — Patient Instructions (Addendum)
 It was wonderful to see you today.  Please bring ALL of your medications with you to every visit.   Today we talked about:  Shortness of breath potentially related to your asthma, seasonal allergies, and GERD.  For your asthma, please continue taking the Advair and albuterol  as prescribed.  Call [facility] Office: [phone] to schedule a follow-up with your pulmonary doctor.  For the seasonal allergies continue taking Zyrtec  as prescribed.  Stop taking the fluticasone  and take azelastine  instead.  For your GERD, continue taking the famotidine /Pepcid  and start Taking the esomeprazole  daily. Send us  a message if you have side effects with the esomeprazole  or it is too expensive and we can try pantoprazole  again. You can take  Tums as needed.   Follow-up in 1 month so we can check on your shortness of breath.  Thank you for choosing [facility].   Please call [phone] with any questions about today's appointment.  Please arrive at least 15 minutes prior to your scheduled appointments.   If you need additional refills before your next appointment, please call your pharmacy first.   Garey Alleva Alena Morrison, MD  [facility]

## 2024-06-16 NOTE — Assessment & Plan Note (Signed)
 Refilled Miralax

## 2024-06-16 NOTE — Assessment & Plan Note (Signed)
 I suspect this also has a component in her shortness of breath.  Patient will continue with Zyrtec  as previously prescribed.  Discontinue fluticasone .  Provided a prescription for azelastine  2 sprays per nare twice daily.

## 2024-06-23 ENCOUNTER — Telehealth (HOSPITAL_COMMUNITY): Payer: Self-pay | Admitting: *Deleted

## 2024-06-23 DIAGNOSIS — F410 Panic disorder [episodic paroxysmal anxiety] without agoraphobia: Secondary | ICD-10-CM | POA: Diagnosis not present

## 2024-06-23 NOTE — Telephone Encounter (Signed)
 Spoke to patient as a reminder about her ETT on 06/30/24 at 3:45.

## 2024-06-25 ENCOUNTER — Encounter (HOSPITAL_COMMUNITY): Payer: Self-pay | Admitting: *Deleted

## 2024-06-25 ENCOUNTER — Ambulatory Visit (HOSPITAL_COMMUNITY)
Admission: EM | Admit: 2024-06-25 | Discharge: 2024-06-25 | Disposition: A | Discharge status: Home / Self Care | Attending: Emergency Medicine | Admitting: Emergency Medicine

## 2024-06-25 ENCOUNTER — Other Ambulatory Visit: Payer: Self-pay

## 2024-06-25 DIAGNOSIS — R0982 Postnasal drip: Secondary | ICD-10-CM | POA: Diagnosis not present

## 2024-06-25 DIAGNOSIS — K219 Gastro-esophageal reflux disease without esophagitis: Secondary | ICD-10-CM | POA: Diagnosis not present

## 2024-06-25 DIAGNOSIS — H9203 Otalgia, bilateral: Secondary | ICD-10-CM | POA: Diagnosis not present

## 2024-06-25 LAB — POCT URINALYSIS DIP (MANUAL ENTRY)
Bilirubin, UA: NEGATIVE
Glucose, UA: NEGATIVE mg/dL
Ketones, POC UA: NEGATIVE mg/dL
Leukocytes, UA: NEGATIVE
Nitrite, UA: NEGATIVE
Spec Grav, UA: 1.01 (ref 1.010–1.025)
Urobilinogen, UA: 0.2 U/dL
pH, UA: 6.5 (ref 5.0–8.0)

## 2024-06-25 LAB — POC SARS CORONAVIRUS 2 AG -  ED: SARS Coronavirus 2 Ag: NEGATIVE

## 2024-06-25 MED ORDER — LIDOCAINE VISCOUS HCL 2 % MT SOLN
OROMUCOSAL | Status: AC
Start: 2024-06-25 — End: 2024-06-25
  Filled 2024-06-25: qty 15

## 2024-06-25 MED ORDER — MAALOX MAX 400-400-40 MG/5ML PO SUSP: 10.0000 mL | Freq: Four times a day (QID) | ORAL | 0 refills | Status: DC | PRN

## 2024-06-25 MED ORDER — LIDOCAINE VISCOUS HCL 2 % MT SOLN
15.0000 mL | Freq: Once | OROMUCOSAL | Status: AC
  Administered 2024-06-25: 15 mL via OROMUCOSAL

## 2024-06-25 MED ORDER — ALUM & MAG HYDROXIDE-SIMETH 200-200-20 MG/5ML PO SUSP
30.0000 mL | Freq: Once | ORAL | Status: AC
  Administered 2024-06-25: 30 mL via ORAL

## 2024-06-25 MED ORDER — ALUM & MAG HYDROXIDE-SIMETH 200-200-20 MG/5ML PO SUSP
ORAL | Status: AC
Start: 2024-06-25 — End: 2024-06-25
  Filled 2024-06-25: qty 30

## 2024-06-25 NOTE — ED Triage Notes (Signed)
 PT reports for past 3 days she has had a HA, ABD pain ,Gerd and bil ear pain . Cough.

## 2024-06-25 NOTE — Discharge Instructions (Addendum)
 COVID testing was negative, you may have a different viral illness causing ear pain and headache.  You can alternate between 800 mg of ibuprofen  and 500 mg of Tylenol  every 4-6 hours for any fever, headache, body aches or chills.  For acid reflux if the Maalox helps you can buy this over-the-counter and take this prior to meals, around 20 min before.  Please pick up the esomeprazole  and take this as prescribed, which is usually around an hour before a meal.  Eating smaller more frequent meals can help.  Please prop up your upper back in addition to your head and neck at night to help prevent acid reflux while laying flat.   For continued symptoms please follow-up with your primary care provider for further management.

## 2024-06-25 NOTE — ED Provider Notes (Signed)
 [facility]    CSN: [id number] Arrival date & time: 06/25/24  1431      History   Chief Complaint Chief Complaint  Patient presents with   Headache   Otalgia   Gastroesophageal Reflux   Abdominal Pain    HPI Veronica [name] is a 33 y.o. female.   Patient presents to clinic over multiple concerns.  Has been having a temporal headache and bilateral ear pain for the past 3 days.  She cleans her ears out daily with Q-tips.  Has not had any drainage or fevers.  Does have some postnasal drip and has been clearing her throat a lot, denies nasal congestion or rhinorrhea.  Does use daily antihistamine and nasal spray.  History of acid reflux, reports this has been ongoing.  She does take Pepcid  daily and was recently advised to start esomeprazole , she has not yet picked this up from the pharmacy but it should be ready for pickup today.  Will notice that after she eats her chest gets tight.  Is having some abdominal discomfort, felt some gas pain out in the lobby and that she had to have a bowel movement.  Feels like her headache may be related to anxiety, which she struggles with.  Will take 1-2 Klonopin per week for this.  The history is provided by the patient and medical records.  Headache Otalgia Gastroesophageal Reflux  Abdominal Pain   Past Medical History:  Diagnosis Date   Abdominal pain 01/06/2012   Acute tension headache 07/27/2019   Anemia    Ankle pain    Anxiety    Asthma    Birth control counseling 08/26/2011   BMI 40.0-44.9, adult (HCC) 02/27/2011   Dysuria 01/13/2013   Epigastric pain 09/13/2019   GERD (gastroesophageal reflux disease)    Heart murmur    Hiatal hernia    Obesity (BMI 30-39.9)    Pre-eclampsia    off med since 4/23   Secondary amenorrhea 03/25/2018   Trichomonas infection 06/17/2022    Patient Active Problem List   Diagnosis Date Noted   Perioral hyperpigmentation 06/16/2024   Generalized pruritus 06/03/2024   Ear  itching 04/25/2024   Paresthesia 04/25/2024   Light headedness 03/31/2024   Panic attacks 02/13/2024   Dizziness 01/08/2024   Constipation 05/17/2023   Nocturnal oxygen desaturation 03/23/2022   Allergic rhinitis 03/07/2022   Asthma 08/31/2020   Back pain 02/13/2020   Hiatal hernia 11/02/2019   Subclinical hypothyroidism 05/19/2019   GAD (generalized anxiety disorder) 05/09/2019   Nonintractable episodic headache 11/15/2018   Seasonal allergies 03/25/2018   Chronic cough 10/16/2017   Fatigue 05/09/2016   GERD (gastroesophageal reflux disease) 03/28/2011   BMI 40.0-44.9, adult (HCC) 02/27/2011    Past Surgical History:  Procedure Laterality Date   CESAREAN SECTION  09/02/2010   For macrosomia, but son was 7 lb 12 oz   CESAREAN SECTION N/A 10/06/2021   Procedure: CESAREAN SECTION;  Surgeon: Izell Harari, MD;  Location: [facility];  Service: Obstetrics;  Laterality: N/A;   CHOLECYSTECTOMY N/A 06/16/2022   Procedure: LAPAROSCOPIC  CHOLECYSTECTOMY;  Surgeon: Signe Mitzie LABOR, MD;  Location: [facility];  Service: General;  Laterality: N/A;   TONSILLECTOMY AND ADENOIDECTOMY      OB History     Gravida  2   Para  2   Term  2   Preterm      AB      Living  2      SAB  IAB      Ectopic      Multiple  0   Live Births  2            Home Medications    Prior to Admission medications   Medication Sig Start Date End Date Taking? Authorizing Provider  albuterol  (VENTOLIN  HFA) 108 (90 Base) MCG/ACT inhaler INHALE 2 PUFFS BY MOUTH EVERY 4 HOURS AS NEEDED FOR WHEEZING OR SHORTNESS OF BREATH OR COUGH. Make appointment to be seen 06/07/24  Yes Reddick, Johnathan B, NP  alum & mag hydroxide-simeth (MAALOX MAX) 400-400-40 MG/5ML suspension Take 10 mLs by mouth every 6 (six) hours as needed for indigestion (20 min prior to meals as needed). 06/25/24  Yes Hanin Decook  N, FNP  azelastine  (ASTELIN ) 0.1 % nasal spray Place 2 sprays into both nostrils 2 (two) times daily.  Use in each nostril as directed 06/16/24  Yes Alena Morrison, Reagan, MD  Blood Pressure KIT Please use to check blood pressure as needed. 07/15/23  Yes Delores Suzann HERO, MD  cetirizine  (ZYRTEC ) 10 MG tablet Take 1 tablet (10 mg total) by mouth daily as needed for allergies. 04/25/24  Yes Sowell, Penne, MD  esomeprazole  (NEXIUM ) 20 MG capsule Take 1 capsule (20 mg total) by mouth daily. 06/16/24 09/14/24 Yes Alena Morrison, Reagan, MD  famotidine  (PEPCID ) 20 MG tablet Take 1 tablet (20 mg total) by mouth 2 (two) times daily. 04/25/24  Yes Sowell, Penne, MD  ferrous sulfate  (FEROSUL) 325 (65 FE) MG tablet Take 1 tablet (325 mg total) by mouth every other day. 03/04/24  Yes Sowell, Penne, MD  fluticasone  (FLONASE ) 50 MCG/ACT nasal spray Place 2 sprays into both nostrils daily. 04/25/24  Yes Sowell, Penne, MD  hydroxypropyl methylcellulose / hypromellose (ISOPTO TEARS / GONIOVISC) 2.5 % ophthalmic solution Place 1 drop into both eyes 4 (four) times daily as needed for dry eyes. 10/24/23  Yes Raspet, Erin K, [name]  polyethylene glycol (MIRALAX  / GLYCOLAX ) 17 g packet Take 17 g by mouth 2 (two) times daily. 06/16/24  Yes Alena Morrison, Reagan, MD  clonazePAM (KLONOPIN) 1 MG tablet Take 1 mg by mouth daily as needed for anxiety.    [provider]  etonogestrel  (NEXPLANON ) 68 MG IMPL implant 1 each by Subdermal route once.    [provider]  FLUoxetine  (PROZAC ) 20 MG tablet Take 20 mg by mouth daily.    [provider]  fluticasone -salmeterol (ADVAIR HFA) 115-21 MCG/ACT inhaler Inhale 2 puffs into the lungs 2 (two) times daily. 04/12/24   Kara Dorn NOVAK, MD  hydrocortisone  2.5 % cream Apply topically 2 (two) times daily. Patient not taking: Reported on 06/16/2024 06/01/24   Enedelia Dorna HERO, FNP  hydrOXYzine  (ATARAX ) 10 MG tablet Take 10 mg by mouth. 10/04/22   [provider]  senna (SENOKOT) 8.6 MG TABS tablet Take 1 tablet (8.6 mg total) by mouth 2 (two)  times daily. Patient taking differently: Take 1 tablet (8.6 mg total) by mouth 2 (two) times daily. 05/15/23   Macario Dorothyann HERO, MD  trimethoprim -polymyxin b  (POLYTRIM ) ophthalmic solution Place 1 drop into the left eye every 6 (six) hours. Patient not taking: Reported on 06/16/2024 10/24/23   Raspet, Rocky POUR, [name]    Family History Family History  Problem Relation Age of Onset   Diabetes Mother    Hypertension Mother    Obesity Mother    Heart attack Mother 41       Death   Other Sister  per-diabetic   Esophageal cancer Neg Hx    Colon cancer Neg Hx    Liver disease Neg Hx     Social History Social History   Tobacco Use   Smoking status: Never   Smokeless tobacco: Never  Vaping Use   Vaping status: Never Used  Substance Use Topics   Alcohol use: Not Currently   Drug use: Not Currently    Comment: last used in teens     Allergies   Morphine , Ibuprofen , and Morphine  and codeine    Review of Systems Review of Systems  Per HPI  Physical Exam Triage Vital Signs ED Triage Vitals  Encounter Vitals Group     BP 06/25/24 1513 106/74     Girls Systolic BP Percentile --      Girls Diastolic BP Percentile --      Boys Systolic BP Percentile --      Boys Diastolic BP Percentile --      Pulse Rate 06/25/24 1513 72     Resp 06/25/24 1513 18     Temp 06/25/24 1513 99.5 F (37.5 C)     Temp src --      SpO2 06/25/24 1513 98 %     Weight --      Height --      Head Circumference --      Peak Flow --      Pain Score 06/25/24 1511 5     Pain Loc --      Pain Education --      Exclude from Growth Chart --    No data found.  Updated Vital Signs BP 106/74   Pulse 72   Temp 99.5 F (37.5 C)   Resp 18   LMP 06/25/2024   SpO2 98%   Visual Acuity Right Eye Distance:   Left Eye Distance:   Bilateral Distance:    Right Eye Near:   Left Eye Near:    Bilateral Near:     Physical Exam Vitals and nursing note reviewed.  Constitutional:      Appearance:  She is well-developed.  HENT:     Head: Normocephalic and atraumatic.     Right Ear: Tympanic membrane, ear canal and external ear normal.     Left Ear: Tympanic membrane, ear canal and external ear normal.     Mouth/Throat:     Mouth: Mucous membranes are moist.  Cardiovascular:     Rate and Rhythm: Normal rate.  Pulmonary:     Effort: Pulmonary effort is normal. No respiratory distress.  Skin:    General: Skin is warm and dry.  Neurological:     General: No focal deficit present.     Mental Status: She is alert and oriented to person, place, and time.     GCS: GCS eye subscore is 4. GCS verbal subscore is 5. GCS motor subscore is 6.  Psychiatric:        Mood and Affect: Mood normal.        Behavior: Behavior normal.      UC Treatments / Results  Labs (all labs ordered are listed, but only abnormal results are displayed) Labs Reviewed  POCT URINALYSIS DIP (MANUAL ENTRY) - Abnormal; Notable for the following components:      Result Value   Color, UA orange (*)    Blood, UA large (*)    Protein Ur, POC trace (*)    All other components within normal limits  POC SARS CORONAVIRUS 2 AG -  ED    EKG   Radiology No results found.  Procedures Procedures (including critical care time)  Medications Ordered in UC Medications  alum & mag hydroxide-simeth (MAALOX/MYLANTA) 200-200-20 MG/5ML suspension 30 mL (30 mLs Oral Given 06/25/24 1559)  lidocaine  (XYLOCAINE ) 2 % viscous mouth solution 15 mL (15 mLs Mouth/Throat Given 06/25/24 1559)    Initial Impression / Assessment and Plan / UC Course  I have reviewed the triage vital signs and the nursing notes.  Pertinent labs & imaging results that were available during my care of the patient were reviewed by me and considered in my medical decision making (see chart for details).  Vitals and triage reviewed, patient is hemodynamically stable.  Postnasal drip present on physical exam.  Tympanic membrane's pearly gray, external  auditory canal without erythema, drainage or swelling.  Headache, ear pain and feeling unwell may be related to viral ideology.  POC rapid COVID negative.  Symptoms may be related to other viral etiology.  Ongoing struggles with acid reflux.  Symptomatic management discussed.  GI cocktail given in clinic, helped improve symptoms.  Plan of care, follow-up care return precautions given, no questions at this time.  PCP follow-up encouraged.     Final Clinical Impressions(s) / UC Diagnoses   Final diagnoses:  Gastroesophageal reflux disease, unspecified whether esophagitis present  Otalgia of both ears  Post-nasal drip     Discharge Instructions      COVID testing was negative, you may have a different viral illness causing ear pain and headache.  You can alternate between 800 mg of ibuprofen  and 500 mg of Tylenol  every 4-6 hours for any fever, headache, body aches or chills.  For acid reflux if the Maalox helps you can buy this over-the-counter and take this prior to meals, around 20 min before.  Please pick up the esomeprazole  and take this as prescribed, which is usually around an hour before a meal.  Eating smaller more frequent meals can help.  Please prop up your upper back in addition to your head and neck at night to help prevent acid reflux while laying flat.   For continued symptoms please follow-up with your primary care provider for further management.      ED Prescriptions     Medication Sig Dispense Auth. Provider   alum & mag hydroxide-simeth (MAALOX MAX) 400-400-40 MG/5ML suspension Take 10 mLs by mouth every 6 (six) hours as needed for indigestion (20 min prior to meals as needed). 355 mL Dreama, Tejas [place]  N, FNP      PDMP not reviewed this encounter.   Dreama, Antwoine Zorn  N, FNP 06/25/24 1624

## 2024-06-30 ENCOUNTER — Ambulatory Visit (HOSPITAL_COMMUNITY)
Admission: RE | Admit: 2024-06-30 | Discharge: 2024-06-30 | Disposition: A | Discharge status: Home / Self Care | Source: Ambulatory Visit | Attending: Cardiovascular Disease | Admitting: Cardiovascular Disease

## 2024-06-30 DIAGNOSIS — R072 Precordial pain: Secondary | ICD-10-CM | POA: Insufficient documentation

## 2024-06-30 LAB — EXERCISE TOLERANCE TEST
Angina Index: 0
Duke Treadmill Score: 8
Estimated workload: 9.9
Exercise duration (min): 8 min
Exercise duration (sec): 0 s
MPHR: 188 {beats}/min
Peak HR: 184 {beats}/min
Percent HR: 97 %
Rest HR: 82 {beats}/min
ST Depression (mm): 0 mm

## 2024-07-01 ENCOUNTER — Ambulatory Visit: Payer: Self-pay | Admitting: Cardiology

## 2024-07-04 ENCOUNTER — Ambulatory Visit (HOSPITAL_COMMUNITY): Admission: EM | Admit: 2024-07-04 | Discharge: 2024-07-04 | Disposition: A | Discharge status: Home / Self Care

## 2024-07-04 ENCOUNTER — Encounter (HOSPITAL_COMMUNITY): Payer: Self-pay

## 2024-07-04 DIAGNOSIS — R21 Rash and other nonspecific skin eruption: Secondary | ICD-10-CM

## 2024-07-04 MED ORDER — PREDNISONE 20 MG PO TABS: ORAL_TABLET | ORAL | 0 refills | Status: AC

## 2024-07-04 MED ORDER — TRIAMCINOLONE ACETONIDE 0.1 % EX OINT: 1.0000 | TOPICAL_OINTMENT | Freq: Two times a day (BID) | CUTANEOUS | 0 refills | Status: AC

## 2024-07-04 NOTE — ED Notes (Signed)
 Pt c/o itchy rash all over since Friday. States her abdomen is the worse. Denies taken any OTC meds. Denies change of product. States she did eat some ones food.

## 2024-07-04 NOTE — Discharge Instructions (Addendum)
  1. Rash and nonspecific skin eruption (Primary) - predniSONE  (DELTASONE ) 20 MG tablet; Take 3 tablets (60 mg total) by mouth daily for 2 days, THEN 2 tablets (40 mg total) daily for 3 days, THEN 1 tablet (20 mg total) daily for 2 days.  Dispense: 14 tablet; Refill: 0 - triamcinolone  ointment (KENALOG ) 0.1 %; Apply 1 Application topically 2 (two) times daily.  Dispense: 30 g; Refill: 0 -Continue to monitor symptoms for any change in severity if there is any escalation of current symptoms or development of new symptoms follow-up in ER for further evaluation and management.

## 2024-07-04 NOTE — ED Provider Notes (Signed)
 [facility]  Note:  This document was prepared using Dragon voice recognition software and may include unintentional dictation errors.  MRN: [id number] DOB: 1991/09/14  Subjective:   Veronica [name] is a 33 y.o. female presenting for pruritic rash to abdomen, back, face x 3 days.  Patient is unsure if she was exposed to any allergic substance.  Patient denies any change in soap, laundry detergent, skin care products, shampoo or any other self-care products.  Patient has not taken any oral antihistamine medication but has been using hydrocortisone  cream with minimal improvement.  Patient denies any shortness of breath, chest pain, weakness, dizziness, swelling to the lips or tongue, difficulty swallowing or wheezing secondary to symptoms.  No current facility-administered medications for this encounter.  Current Outpatient Medications:    predniSONE  (DELTASONE ) 20 MG tablet, Take 3 tablets (60 mg total) by mouth daily for 2 days, THEN 2 tablets (40 mg total) daily for 3 days, THEN 1 tablet (20 mg total) daily for 2 days., Disp: 14 tablet, Rfl: 0   triamcinolone  ointment (KENALOG ) 0.1 %, Apply 1 Application topically 2 (two) times daily., Disp: 30 g, Rfl: 0   albuterol  (VENTOLIN  HFA) 108 (90 Base) MCG/ACT inhaler, INHALE 2 PUFFS BY MOUTH EVERY 4 HOURS AS NEEDED FOR WHEEZING OR SHORTNESS OF BREATH OR COUGH. Make appointment to be seen, Disp: 18 g, Rfl: 1   alum & mag hydroxide-simeth (MAALOX MAX) 400-400-40 MG/5ML suspension, Take 10 mLs by mouth every 6 (six) hours as needed for indigestion (20 min prior to meals as needed)., Disp: 355 mL, Rfl: 0   azelastine  (ASTELIN ) 0.1 % nasal spray, Place 2 sprays into both nostrils 2 (two) times daily. Use in each nostril as directed, Disp: 30 mL, Rfl: 3   Blood Pressure KIT, Please use to check blood pressure as needed., Disp: 1 kit, Rfl: 0   cetirizine  (ZYRTEC ) 10 MG tablet, Take 1 tablet (10 mg total) by mouth daily as needed for allergies.,  Disp: 30 tablet, Rfl: 11   clonazePAM (KLONOPIN) 1 MG tablet, Take 1 mg by mouth daily as needed for anxiety., Disp: , Rfl:    esomeprazole  (NEXIUM ) 20 MG capsule, Take 1 capsule (20 mg total) by mouth daily., Disp: 30 capsule, Rfl: 2   etonogestrel  (NEXPLANON ) 68 MG IMPL implant, 1 each by Subdermal route once., Disp: , Rfl:    famotidine  (PEPCID ) 20 MG tablet, Take 1 tablet (20 mg total) by mouth 2 (two) times daily., Disp: 60 tablet, Rfl: 2   ferrous sulfate  (FEROSUL) 325 (65 FE) MG tablet, Take 1 tablet (325 mg total) by mouth every other day., Disp: 30 tablet, Rfl: 2   FLUoxetine  (PROZAC ) 20 MG tablet, Take 20 mg by mouth daily., Disp: , Rfl:    fluticasone  (FLONASE ) 50 MCG/ACT nasal spray, Place 2 sprays into both nostrils daily., Disp: 16 g, Rfl: 6   fluticasone -salmeterol (ADVAIR HFA) 115-21 MCG/ACT inhaler, Inhale 2 puffs into the lungs 2 (two) times daily., Disp: 1 each, Rfl: 12   hydrocortisone  2.5 % cream, Apply topically 2 (two) times daily. (Patient not taking: Reported on 06/16/2024), Disp: 30 g, Rfl: 0   hydroxypropyl methylcellulose / hypromellose (ISOPTO TEARS / GONIOVISC) 2.5 % ophthalmic solution, Place 1 drop into both eyes 4 (four) times daily as needed for dry eyes., Disp: 15 mL, Rfl: 0   hydrOXYzine  (ATARAX ) 10 MG tablet, Take 10 mg by mouth., Disp: , Rfl:    polyethylene glycol (MIRALAX  / GLYCOLAX ) 17 g packet, Take 17  g by mouth 2 (two) times daily., Disp: 14 each, Rfl: 1   senna (SENOKOT) 8.6 MG TABS tablet, Take 1 tablet (8.6 mg total) by mouth 2 (two) times daily. (Patient taking differently: Take 1 tablet (8.6 mg total) by mouth 2 (two) times daily.), Disp: 120 tablet, Rfl: 0   trimethoprim -polymyxin b  (POLYTRIM ) ophthalmic solution, Place 1 drop into the left eye every 6 (six) hours. (Patient not taking: Reported on 06/16/2024), Disp: 10 mL, Rfl: 0   Allergies  Allergen Reactions   Morphine  Anaphylaxis   Ibuprofen  Nausea And Vomiting    Was told to not take this  because it would flare stomach issues   Morphine  And Codeine  Other (See Comments)    Causes chest to be heavy    Past Medical History:  Diagnosis Date   Abdominal pain 01/06/2012   Acute tension headache 07/27/2019   Anemia    Ankle pain    Anxiety    Asthma    Birth control counseling 08/26/2011   BMI 40.0-44.9, adult (HCC) 02/27/2011   Dysuria 01/13/2013   Epigastric pain 09/13/2019   GERD (gastroesophageal reflux disease)    Heart murmur    Hiatal hernia    Obesity (BMI 30-39.9)    Pre-eclampsia    off med since 4/23   Secondary amenorrhea 03/25/2018   Trichomonas infection 06/17/2022     Past Surgical History:  Procedure Laterality Date   CESAREAN SECTION  09/02/2010   For macrosomia, but son was 7 lb 12 oz   CESAREAN SECTION N/A 10/06/2021   Procedure: CESAREAN SECTION;  Surgeon: Izell Harari, MD;  Location: [facility];  Service: Obstetrics;  Laterality: N/A;   CHOLECYSTECTOMY N/A 06/16/2022   Procedure: LAPAROSCOPIC  CHOLECYSTECTOMY;  Surgeon: Signe Mitzie LABOR, MD;  Location: [facility];  Service: General;  Laterality: N/A;   TONSILLECTOMY AND ADENOIDECTOMY      Family History  Problem Relation Age of Onset   Diabetes Mother    Hypertension Mother    Obesity Mother    Heart attack Mother 60       Death   Other Sister        per-diabetic   Esophageal cancer Neg Hx    Colon cancer Neg Hx    Liver disease Neg Hx     Social History   Tobacco Use   Smoking status: Never   Smokeless tobacco: Never  Vaping Use   Vaping status: Never Used  Substance Use Topics   Alcohol use: Not Currently   Drug use: Not Currently    Comment: last used in teens    ROS Refer to HPI for ROS details.  Objective:   Vitals: BP 110/78 (BP Location: Right Arm)   Pulse 75   Temp 98.2 F (36.8 C) (Oral)   Resp 18   LMP 06/25/2024   SpO2 97%   Physical Exam Vitals and nursing note reviewed.  Constitutional:      General: She is not in acute distress.     Appearance: She is well-developed. She is not ill-appearing or toxic-appearing.  HENT:     Head: Normocephalic and atraumatic.     Mouth/Throat:     Mouth: Mucous membranes are moist.     Pharynx: Oropharynx is clear. No posterior oropharyngeal erythema.  Cardiovascular:     Rate and Rhythm: Normal rate.  Pulmonary:     Effort: Pulmonary effort is normal. No respiratory distress.  Skin:    General: Skin is warm and dry.  Findings: Erythema and rash present. Rash is papular.  Neurological:     General: No focal deficit present.     Mental Status: She is alert and oriented to person, place, and time.  Psychiatric:        Mood and Affect: Mood normal.        Behavior: Behavior normal.     Procedures  No results found for this or any previous visit (from the past 24 hours).  No results found.   Assessment and Plan :     Discharge Instructions       1. Rash and nonspecific skin eruption (Primary) - predniSONE  (DELTASONE ) 20 MG tablet; Take 3 tablets (60 mg total) by mouth daily for 2 days, THEN 2 tablets (40 mg total) daily for 3 days, THEN 1 tablet (20 mg total) daily for 2 days.  Dispense: 14 tablet; Refill: 0 - triamcinolone  ointment (KENALOG ) 0.1 %; Apply 1 Application topically 2 (two) times daily.  Dispense: 30 g; Refill: 0 -Continue to monitor symptoms for any change in severity if there is any escalation of current symptoms or development of new symptoms follow-up in ER for further evaluation and management.      Darbi Chandran B Sevin Farone   Shawn Dannenberg, [place] B, [place] 07/04/24 2114

## 2024-07-08 ENCOUNTER — Encounter: Payer: Self-pay | Admitting: Family Medicine

## 2024-07-08 ENCOUNTER — Ambulatory Visit: Admitting: Family Medicine

## 2024-07-08 VITALS — BP 105/68 | HR 63 | Ht 62.0 in | Wt 227.0 lb

## 2024-07-08 DIAGNOSIS — L299 Pruritus, unspecified: Secondary | ICD-10-CM | POA: Diagnosis present

## 2024-07-08 DIAGNOSIS — K219 Gastro-esophageal reflux disease without esophagitis: Secondary | ICD-10-CM | POA: Diagnosis not present

## 2024-07-08 DIAGNOSIS — J301 Allergic rhinitis due to pollen: Secondary | ICD-10-CM

## 2024-07-08 MED ORDER — EPINEPHRINE 0.3 MG/0.3ML IJ SOAJ: 0.3000 mg | INTRAMUSCULAR | 1 refills | Status: DC | PRN

## 2024-07-08 MED ORDER — EPINEPHRINE 0.3 MG/0.3ML IJ SOAJ
0.3000 mg | Freq: Once | INTRAMUSCULAR | 1 refills | Status: DC | PRN
Start: 2024-07-08 — End: 2024-07-08

## 2024-07-08 NOTE — Patient Instructions (Signed)
 Continue pepcid  and zyrtec . Be sure to take esomeprazole . Let me know how this works.  I have sent in a referral to allergy and also sent an epi-pen just in case.

## 2024-07-08 NOTE — Assessment & Plan Note (Signed)
 Will send to allergy for formal testing given rash after eating fish as well as her continued generalized pruritus.  Continue Zyrtec  and azelastine .  We also discussed using ointment such as Aquaphor or Vaseline to help with potential compounding xerosis.  Will also send an EpiPen  to have on hand.

## 2024-07-08 NOTE — Progress Notes (Signed)
    SUBJECTIVE:   CHIEF COMPLAINT / HPI:   GERD Last seen earlier this month and given pepcid  for control. She went to [facility] for this and got liquid antacids. She does have a GI. She has not esomeprazole  just yet.  Allergies, skin itchiness She has been taking Zyrtec  and azelastine . Has switched to sensitive skin body wash and detergent for clothes. She also ate fish last week and had red bumps all over her body. She went to the UC and was placed on prednisone . She has also shaved her arms which also makes a little itchy. She feels her skin may be a little dry.  PERTINENT  PMH / PSH: GAD, elevated BMI, headaches  OBJECTIVE:   BP 105/68   Pulse 63   Ht 5' 2 (1.575 m)   Wt 227 lb (103 kg)   LMP 06/25/2024   SpO2 100%   BMI 41.52 kg/m   General: Alert and oriented, in NAD Skin: Warm, dry, and intact without lesions on my exam HEENT: NCAT, EOM grossly normal, midline nasal septum Respiratory: Breathing and speaking comfortably on RA Extremities: Moves all extremities grossly equally Neurological: No gross focal deficit Psychiatric: Appropriate mood and affect   ASSESSMENT/PLAN:   Assessment & Plan Generalized pruritus Allergic rhinitis due to pollen, unspecified seasonality Will send to allergy for formal testing given rash after eating fish as well as her continued generalized pruritus.  Continue Zyrtec  and azelastine .  We also discussed using ointment such as Aquaphor or Vaseline to help with potential compounding xerosis.  Will also send an EpiPen  to have on hand. Gastroesophageal reflux disease, unspecified whether esophagitis present Advised to start esomeprazole .  Continue Pepcid  twice daily.  Discussed going down to using antacids once daily before stopping altogether.  Can consider going to esomeprazole  twice daily if uncontrolled.  If patient continues to have symptoms, would recommend further GI follow-up.   Stuart Redo, MD [facility]

## 2024-07-08 NOTE — Assessment & Plan Note (Signed)
 Advised to start esomeprazole .  Continue Pepcid  twice daily.  Discussed going down to using antacids once daily before stopping altogether.  Can consider going to esomeprazole  twice daily if uncontrolled.  If patient continues to have symptoms, would recommend further GI follow-up.

## 2024-07-11 ENCOUNTER — Other Ambulatory Visit (HOSPITAL_COMMUNITY): Payer: Self-pay

## 2024-07-12 DIAGNOSIS — F410 Panic disorder [episodic paroxysmal anxiety] without agoraphobia: Secondary | ICD-10-CM | POA: Diagnosis not present

## 2024-07-20 DIAGNOSIS — R002 Palpitations: Secondary | ICD-10-CM

## 2024-07-21 ENCOUNTER — Ambulatory Visit (HOSPITAL_COMMUNITY)
Admission: RE | Admit: 2024-07-21 | Discharge: 2024-07-21 | Disposition: A | Discharge status: Home / Self Care | Source: Ambulatory Visit | Attending: Cardiology | Admitting: Cardiology

## 2024-07-21 DIAGNOSIS — R072 Precordial pain: Secondary | ICD-10-CM | POA: Diagnosis not present

## 2024-07-21 LAB — ECHOCARDIOGRAM COMPLETE
Area-P 1/2: 3.96 cm2
S' Lateral: 2.6 cm

## 2024-07-25 NOTE — Telephone Encounter (Signed)
 Called and made patient aware that per Dr. Kate echocardiogram there were no significant abnormalities.

## 2024-07-28 DIAGNOSIS — F410 Panic disorder [episodic paroxysmal anxiety] without agoraphobia: Secondary | ICD-10-CM | POA: Diagnosis not present

## 2024-08-11 ENCOUNTER — Ambulatory Visit: Admitting: Allergy

## 2024-08-11 ENCOUNTER — Other Ambulatory Visit: Payer: Self-pay

## 2024-08-11 ENCOUNTER — Encounter: Payer: Self-pay | Admitting: Allergy

## 2024-08-11 VITALS — BP 100/66 | HR 70 | Temp 98.1°F | Ht 62.6 in | Wt 233.1 lb

## 2024-08-11 DIAGNOSIS — L508 Other urticaria: Secondary | ICD-10-CM

## 2024-08-11 DIAGNOSIS — J4540 Moderate persistent asthma, uncomplicated: Secondary | ICD-10-CM | POA: Diagnosis not present

## 2024-08-11 DIAGNOSIS — K219 Gastro-esophageal reflux disease without esophagitis: Secondary | ICD-10-CM

## 2024-08-11 DIAGNOSIS — L299 Pruritus, unspecified: Secondary | ICD-10-CM

## 2024-08-11 DIAGNOSIS — J310 Chronic rhinitis: Secondary | ICD-10-CM

## 2024-08-11 MED ORDER — MONTELUKAST SODIUM 10 MG PO TABS: 10.0000 mg | ORAL_TABLET | Freq: Every day | ORAL | 4 refills | Status: DC

## 2024-08-11 MED ORDER — CETIRIZINE HCL 10 MG PO TABS: 10.0000 mg | ORAL_TABLET | Freq: Two times a day (BID) | ORAL | 4 refills | Status: DC

## 2024-08-11 MED ORDER — FAMOTIDINE 20 MG PO TABS
20.0000 mg | ORAL_TABLET | Freq: Two times a day (BID) | ORAL | 4 refills | Status: AC
Start: 2024-08-11 — End: ?

## 2024-08-11 NOTE — Progress Notes (Unsigned)
 New Patient Note  RE: Veronica [name] MRN: [id number] DOB: 22-Apr-1991 Date of Office Visit: 08/11/2024  Primary care provider: Lonnie Earnest, MD  Chief Complaint: itch, asthma, eczema  History of present illness: Veronica [name] is a 33 y.o. female presenting today for evaluation of allergies.  Discussed the use of AI scribe software for clinical note transcription with the patient, who gave verbal consent to proceed.  She has been experiencing intermittent itching for the past three months, affecting her back, legs, and face. The itching occurs throughout the day and she notices it after walking her dog or exercising.  Approximately three weeks ago, she developed small, white, pimple-like bumps on her face, back of her legs, and back. The bumps on her face have resolved, but those on her legs persist and are itchy. She has been using hydrocortisone  and triamcinolone  ointments, which provide temporary relief, and applies Vaseline.  She takes Zyrtec  daily, but it has not alleviated her itching or bumps. Additionally, she takes Pepcid  twice daily for reflux and also takes a liquid medication for reflux.  She has a history of asthma, triggered by heat and spicy foods, and uses an albuterol  inhaler as needed, approximately twice a month, primarily for coughing and wheezing. Recently, she has experienced chest tightness, which exacerbates her anxiety.  She takes hydroxyzine  for anxiety, using it only when experiencing pain, and has not used it frequently in the past few months, except for an episode three weeks ago. She reports intermittent swelling of her lips, around her eyes, and ears, along with frequent itching, burning, and redness of her eyes. No use of eye drops due to fear of reactions with other medications.        Review of systems: 10pt ROS negative unless noted above in HPI  Past medical history: Past Medical History:  Diagnosis Date   Abdominal pain 01/06/2012   Acute  tension headache 07/27/2019   Anemia    Ankle pain    Anxiety    Asthma    Birth control counseling 08/26/2011   BMI 40.0-44.9, adult (HCC) 02/27/2011   Dysuria 01/13/2013   Epigastric pain 09/13/2019   GERD (gastroesophageal reflux disease)    Heart murmur    Hiatal hernia    Obesity (BMI 30-39.9)    Pre-eclampsia    off med since 4/23   Secondary amenorrhea 03/25/2018   Trichomonas infection 06/17/2022    Past surgical history: Past Surgical History:  Procedure Laterality Date   ADENOIDECTOMY     CESAREAN SECTION  09/02/2010   For macrosomia, but son was 7 lb 12 oz   CESAREAN SECTION N/A 10/06/2021   Procedure: CESAREAN SECTION;  Surgeon: Izell Harari, MD;  Location: [facility];  Service: Obstetrics;  Laterality: N/A;   CHOLECYSTECTOMY N/A 06/16/2022   Procedure: LAPAROSCOPIC  CHOLECYSTECTOMY;  Surgeon: Signe Mitzie LABOR, MD;  Location: [facility];  Service: General;  Laterality: N/A;   TONSILLECTOMY AND ADENOIDECTOMY      Family history:  Family History  Problem Relation Age of Onset   Diabetes Mother    Hypertension Mother    Obesity Mother    Heart attack Mother 37       Death   Allergic rhinitis Sister    Other Sister        per-diabetic   Esophageal cancer Neg Hx    Colon cancer Neg Hx    Liver disease Neg Hx     Social history: Lives in a home with  carpeting with electric heating and central cooling.  Dog in the home.  There is no concern for roaches in the home.  She works helping the elderly.  Does not report smoking history.   Medication List: Current Outpatient Medications  Medication Sig Dispense Refill   albuterol  (VENTOLIN  HFA) 108 (90 Base) MCG/ACT inhaler INHALE 2 PUFFS BY MOUTH EVERY 4 HOURS AS NEEDED FOR WHEEZING OR SHORTNESS OF BREATH OR COUGH. Make appointment to be seen (Patient taking differently: 2 puffs as needed. INHALE 2 PUFFS BY MOUTH EVERY 4 HOURS AS NEEDED FOR WHEEZING OR SHORTNESS OF BREATH OR COUGH. Make appointment to be seen) 18  g 1   alum & mag hydroxide-simeth (MAALOX MAX) 400-400-40 MG/5ML suspension Take 10 mLs by mouth every 6 (six) hours as needed for indigestion (20 min prior to meals as needed). 355 mL 0   azelastine  (ASTELIN ) 0.1 % nasal spray Place 2 sprays into both nostrils 2 (two) times daily. Use in each nostril as directed 30 mL 3   Blood Pressure KIT Please use to check blood pressure as needed. 1 kit 0   cetirizine  (ZYRTEC ) 10 MG tablet Take 1 tablet (10 mg total) by mouth daily as needed for allergies. 30 tablet 11   clonazePAM (KLONOPIN) 1 MG tablet Take 1 mg by mouth daily as needed for anxiety.     EPINEPHrine  (AUVI-Q ) 0.3 mg/0.3 mL IJ SOAJ injection Inject 0.3 mg into the muscle as needed for anaphylaxis. 2 each 1   etonogestrel  (NEXPLANON ) 68 MG IMPL implant 1 each by Subdermal route once.     famotidine  (PEPCID ) 20 MG tablet Take 1 tablet (20 mg total) by mouth 2 (two) times daily. 60 tablet 2   ferrous sulfate  (FEROSUL) 325 (65 FE) MG tablet Take 1 tablet (325 mg total) by mouth every other day. 30 tablet 2   FLUoxetine  (PROZAC ) 20 MG tablet Take 20 mg by mouth daily.     fluticasone  (FLONASE ) 50 MCG/ACT nasal spray Place 2 sprays into both nostrils daily. 16 g 6   fluticasone  (FLOVENT  HFA) 110 MCG/ACT inhaler Inhale 1 puff into the lungs as needed.     hydrocortisone  2.5 % cream Apply topically 2 (two) times daily. (Patient taking differently: Apply topically as needed.) 30 g 0   hydroxypropyl methylcellulose / hypromellose (ISOPTO TEARS / GONIOVISC) 2.5 % ophthalmic solution Place 1 drop into both eyes 4 (four) times daily as needed for dry eyes. 15 mL 0   hydrOXYzine  (ATARAX ) 10 MG tablet Take 10 mg by mouth.     polyethylene glycol (MIRALAX  / GLYCOLAX ) 17 g packet Take 17 g by mouth 2 (two) times daily. (Patient taking differently: Take 17 g by mouth as needed.) 14 each 1   senna (SENOKOT) 8.6 MG TABS tablet Take 1 tablet (8.6 mg total) by mouth 2 (two) times daily. (Patient taking differently:  Take 1 tablet by mouth as needed.) 120 tablet 0   triamcinolone  ointment (KENALOG ) 0.1 % Apply 1 Application topically 2 (two) times daily. (Patient taking differently: Apply 1 Application topically as needed.) 30 g 0   trimethoprim -polymyxin b  (POLYTRIM ) ophthalmic solution Place 1 drop into the left eye every 6 (six) hours. (Patient taking differently: Place 1 drop into the left eye as needed.) 10 mL 0   esomeprazole  (NEXIUM ) 20 MG capsule Take 1 capsule (20 mg total) by mouth daily. (Patient not taking: Reported on 08/11/2024) 30 capsule 2   fluticasone -salmeterol (ADVAIR HFA) 115-21 MCG/ACT inhaler Inhale 2 puffs into the  lungs 2 (two) times daily. (Patient not taking: Reported on 08/11/2024) 1 each 12   No current facility-administered medications for this visit.    Known medication allergies: Allergies  Allergen Reactions   Morphine  Anaphylaxis   Ibuprofen  Nausea And Vomiting    Was told to not take this because it would flare stomach issues   Morphine  And Codeine  Other (See Comments)    Causes chest to be heavy     Physical examination: Blood pressure 100/66, pulse 70, temperature 98.1 F (36.7 C), temperature source Temporal, height 5' 2.6 (1.59 m), weight 233 lb 1.6 oz (105.7 kg), SpO2 97%.  General: Alert, interactive, in no acute distress. HEENT: PERRLA, TMs pearly gray, turbinates minimally edematous without discharge, post-pharynx non erythematous. Neck: Supple without lymphadenopathy. Lungs: Clear to auscultation without wheezing, rhonchi or rales. {no increased work of breathing. CV: Normal S1, S2 without murmurs. Abdomen: Nondistended, nontender. Skin: Warm and dry, without lesions or rashes. Extremities:  No clubbing, cyanosis or edema. Neuro:   Grossly intact.  Diagnostics/Labs:  Spirometry: FEV1: 2.43L 95%, FVC: 3.13L 104%, ratio consistent with nonobstructive pattern  Assessment and plan:   Chronic idiopathic urticaria with pruritus Chronic idiopathic  urticaria with insufficient response to Zyrtec  and Pepcid .  Discussed montelukast  for symptom control and potential progression to Xolair. - Increase Zyrtec  10mg  1 tab to twice daily. - Continue Pepcid  20mg  1 tab twice daily. - Start montelukast  (Singulair ) 10mg  1 tab at night.  Singulair  (montelukast ) 10mg  daily.  This is an antileukotriene that can help with both allergy and asthma symptom control.  If you notice any change in mood/behavior/sleep after starting Singulair  then stop this medication and let us  know.  Symptoms resolve after stopping the medication.  -  Your symptoms have been ongoing for >6 weeks making this chronic thus will obtain labwork to evaluate: CBC w diff, CMP, tryptase, hive panel, environmental panel, alpha-gal panel, inflammatory markers - Provide emergency action plan for asthma management as you have an epipen  device.   Allergic rhinitis Allergic rhinitis with exacerbated eye symptoms despite Zyrtec . - Continue Zyrtec  twice daily.  Asthma, adult-onset Adult-onset asthma with symptoms exacerbated by heat and spicy foods. Previous treatments ineffective. Discussed montelukast  as a controller medication. Informed about montelukast 's black box warning. - Start montelukast  (Singulair ) at night. - Continue albuterol  as needed. - Perform lung function test.  Follow-up in 3 months or sooner if needed  I appreciate the opportunity to take part in Maythe's care. Please do not hesitate to contact me with questions.  Sincerely,   Danita Brain, MD Allergy/Immunology [facility]

## 2024-08-11 NOTE — Patient Instructions (Addendum)
 Chronic idiopathic urticaria with pruritus Chronic idiopathic urticaria with insufficient response to Zyrtec  and Pepcid .  Discussed montelukast  for symptom control and potential progression to Xolair. - Increase Zyrtec  10mg  1 tab to twice daily. - Continue Pepcid  20mg  1 tab twice daily. - Start montelukast  (Singulair ) 10mg  1 tab at night.  Singulair  (montelukast ) 10mg  daily.  This is an antileukotriene that can help with both allergy and asthma symptom control.  If you notice any change in mood/behavior/sleep after starting Singulair  then stop this medication and let us  know.  Symptoms resolve after stopping the medication.  -  Your symptoms have been ongoing for >6 weeks making this chronic thus will obtain labwork to evaluate: CBC w diff, CMP, tryptase, hive panel, environmental panel, alpha-gal panel, inflammatory markers - Provide emergency action plan for asthma management as you have an epipen  device.   Allergic rhinitis Allergic rhinitis with exacerbated eye symptoms despite Zyrtec . - Continue Zyrtec  twice daily.  Asthma, adult-onset Adult-onset asthma with symptoms exacerbated by heat and spicy foods. Previous treatments ineffective. Discussed montelukast  as a controller medication. Informed about montelukast 's black box warning. - Start montelukast  (Singulair ) at night. - Continue albuterol  as needed. - Perform lung function test.  Follow-up in 3 months or sooner if needed

## 2024-08-14 LAB — ALLERGEN PROFILE, FOOD-FRUIT
Allergen Apple, IgE: <0.1 kU/L
Allergen Banana IgE: 0.18 kU/L — AB
Allergen Grape IgE: <0.1 kU/L
Allergen Pear IgE: <0.1 kU/L
Allergen, Peach f95: <0.1 kU/L

## 2024-08-14 LAB — CHRONIC URTICARIA (CU) EVAL
ALT: 10 IU/L (ref 0–32)
Basophils Absolute: 0 x10E3/uL (ref 0.0–0.2)
Basos: 0 %
CRP: 10 mg/L (ref 0–10)
EOS (ABSOLUTE): 0.2 x10E3/uL (ref 0.0–0.4)
Eos: 2 %
Hematocrit: 38.7 % (ref 34.0–46.6)
Hemoglobin: 12.3 g/dL (ref 11.1–15.9)
Immature Grans (Abs): 0 x10E3/uL (ref 0.0–0.1)
Immature Granulocytes: 0 %
Lymphocytes Absolute: 2.6 x10E3/uL (ref 0.7–3.1)
Lymphs: 37 %
MCH: 28.1 pg (ref 26.6–33.0)
MCHC: 31.8 g/dL (ref 31.5–35.7)
MCV: 89 fL (ref 79–97)
Monocytes Absolute: 0.4 x10E3/uL (ref 0.1–0.9)
Monocytes: 6 %
Neutrophils Absolute: 3.8 x10E3/uL (ref 1.4–7.0)
Neutrophils: 55 %
Platelets: 353 x10E3/uL (ref 150–450)
Pooled Donor- BAT CU: 3.4 (ref 0.00–10.60)
RBC: 4.37 x10E6/uL (ref 3.77–5.28)
RDW: 12.9 % (ref 11.7–15.4)
Sed Rate: 22 mm/h (ref 0–32)
TSH: 1.55 u[IU]/mL (ref 0.450–4.500)
Thyroperoxidase Ab SerPl-aCnc: 15 [IU]/mL (ref 0–34)
WBC: 6.9 x10E3/uL (ref 3.4–10.8)

## 2024-08-14 LAB — ALLERGENS W/TOTAL IGE AREA 2
Alternaria Alternata IgE: 15.4 kU/L — AB
Aspergillus Fumigatus IgE: 1.04 kU/L — AB
Bermuda Grass IgE: 1.05 kU/L — AB
Cat Dander IgE: 3.92 kU/L — AB
Cedar, Mountain IgE: 1.8 kU/L — AB
Cladosporium Herbarum IgE: 0.54 kU/L — AB
Cockroach, German IgE: 0.1 kU/L — AB
Common Silver Birch IgE: <0.1 kU/L
Cottonwood IgE: 0.14 kU/L — AB
D Farinae IgE: 0.43 kU/L — AB
D Pteronyssinus IgE: 0.41 kU/L — AB
Dog Dander IgE: 3.77 kU/L — AB
Elm, American IgE: 0.16 kU/L — AB
Johnson Grass IgE: 2.31 kU/L — AB
Maple/Box Elder IgE: 0.17 kU/L — AB
Mouse Urine IgE: 0.54 kU/L — AB
Oak, White IgE: <0.1 kU/L
Pecan, Hickory IgE: 0.1 kU/L — AB
Penicillium Chrysogen IgE: 0.22 kU/L — AB
Pigweed, Rough IgE: <0.1 kU/L
Ragweed, Short IgE: 0.19 kU/L — AB
Sheep Sorrel IgE Qn: <0.1 kU/L
Timothy Grass IgE: 7.88 kU/L — AB
White Mulberry IgE: <0.1 kU/L

## 2024-08-14 LAB — TRYPTASE: Tryptase: 4.4 ug/L (ref 2.2–13.2)

## 2024-08-14 LAB — ALPHA-GAL PANEL
Allergen Lamb IgE: <0.1 kU/L
Beef IgE: <0.1 kU/L
IgE (Immunoglobulin E), Serum: 289 [IU]/mL (ref 6–495)
O215-IgE Alpha-Gal: <0.1 kU/L
Pork IgE: <0.1 kU/L

## 2024-08-14 LAB — ALLERGEN,OAT,F7: Allergen Oat IgE: 0.61 kU/L — AB

## 2024-08-14 LAB — ALLERGEN PANEL, FOOD-BERRY
Allergen Blueberry IgE: <0.1 kU/L
Allergen Strawberry IgE: <0.1 kU/L
F343-IgE Raspberry: <0.1 kU/L

## 2024-08-17 ENCOUNTER — Encounter (HOSPITAL_COMMUNITY): Payer: Self-pay | Admitting: Emergency Medicine

## 2024-08-17 ENCOUNTER — Ambulatory Visit (HOSPITAL_COMMUNITY)
Admission: EM | Admit: 2024-08-17 | Discharge: 2024-08-17 | Disposition: A | Discharge status: Home / Self Care | Attending: Internal Medicine | Admitting: Internal Medicine

## 2024-08-17 ENCOUNTER — Other Ambulatory Visit: Payer: Self-pay

## 2024-08-17 DIAGNOSIS — M546 Pain in thoracic spine: Secondary | ICD-10-CM

## 2024-08-17 DIAGNOSIS — Z3202 Encounter for pregnancy test, result negative: Secondary | ICD-10-CM

## 2024-08-17 DIAGNOSIS — M545 Low back pain, unspecified: Secondary | ICD-10-CM

## 2024-08-17 LAB — POCT URINALYSIS DIP (MANUAL ENTRY)
Bilirubin, UA: NEGATIVE
Blood, UA: NEGATIVE
Glucose, UA: NEGATIVE mg/dL
Ketones, POC UA: NEGATIVE mg/dL
Leukocytes, UA: NEGATIVE
Nitrite, UA: NEGATIVE
Protein Ur, POC: NEGATIVE mg/dL
Spec Grav, UA: <=1.005 — AB (ref 1.010–1.025)
Urobilinogen, UA: 0.2 U/dL
pH, UA: 6 (ref 5.0–8.0)

## 2024-08-17 LAB — POCT URINE PREGNANCY: Preg Test, Ur: NEGATIVE

## 2024-08-17 MED ORDER — METHYLPREDNISOLONE 4 MG PO TBPK: ORAL_TABLET | ORAL | 0 refills | Status: DC

## 2024-08-17 MED ORDER — KETOROLAC TROMETHAMINE 30 MG/ML IJ SOLN
30.0000 mg | Freq: Once | INTRAMUSCULAR | Status: AC
  Administered 2024-08-17: 30 mg via INTRAMUSCULAR

## 2024-08-17 MED ORDER — KETOROLAC TROMETHAMINE 30 MG/ML IJ SOLN
INTRAMUSCULAR | Status: AC
  Filled 2024-08-17: qty 1

## 2024-08-17 NOTE — Discharge Instructions (Signed)
 You have a strain in the left lower part of your back. You will be given an injection for pain relief before leaving today. I have also prescribed a steroid pack for pain relief. Please see the attached exercises that will help with healing and strengthening to prevent future injury. Return to care if pain worsens or does improve.

## 2024-08-17 NOTE — ED Notes (Signed)
 Provided a work note at patient's request

## 2024-08-17 NOTE — ED Provider Notes (Signed)
 [facility]    CSN: [id number] Arrival date & time: 08/17/24  1033      History   Chief Complaint Chief Complaint  Patient presents with   Back Pain    HPI Veronica [name] is a 33 y.o. female who presents to the urgent care endorsing a 3-day history of left lumbar back pain and abdominal cramping.  Pain began insidiously.  She is unaware of any specific event or inciting trauma.  Pain is worse with movement of the waist in general.  She denies nausea/vomiting, diarrhea/constipation, blood in her stool, dysuria, hematuria, and urinary hesitancy.  She is unaware of any recent sick contacts.  She works with a Horticulturist, commercial houses and believes this may have triggered her pain.   Past Medical History:  Diagnosis Date   Abdominal pain 01/06/2012   Acute tension headache 07/27/2019   Anemia    Ankle pain    Anxiety    Asthma    Birth control counseling 08/26/2011   BMI 40.0-44.9, adult (HCC) 02/27/2011   Dysuria 01/13/2013   Epigastric pain 09/13/2019   GERD (gastroesophageal reflux disease)    Heart murmur    Hiatal hernia    Obesity (BMI 30-39.9)    Pre-eclampsia    off med since 4/23   Secondary amenorrhea 03/25/2018   Trichomonas infection 06/17/2022    Patient Active Problem List   Diagnosis Date Noted   Perioral hyperpigmentation 06/16/2024   Generalized pruritus 06/03/2024   Ear itching 04/25/2024   Paresthesia 04/25/2024   Light headedness 03/31/2024   Panic attacks 02/13/2024   Dizziness 01/08/2024   Constipation 05/17/2023   Nocturnal oxygen desaturation 03/23/2022   Allergic rhinitis 03/07/2022   Asthma 08/31/2020   Back pain 02/13/2020   Hiatal hernia 11/02/2019   Subclinical hypothyroidism 05/19/2019   GAD (generalized anxiety disorder) 05/09/2019   Nonintractable episodic headache 11/15/2018   Seasonal allergies 03/25/2018   Chronic cough 10/16/2017   Fatigue 05/09/2016   GERD (gastroesophageal reflux disease) 03/28/2011    BMI 40.0-44.9, adult (HCC) 02/27/2011    Past Surgical History:  Procedure Laterality Date   ADENOIDECTOMY     CESAREAN SECTION  09/02/2010   For macrosomia, but son was 7 lb 12 oz   CESAREAN SECTION N/A 10/06/2021   Procedure: CESAREAN SECTION;  Surgeon: Izell Harari, MD;  Location: [facility];  Service: Obstetrics;  Laterality: N/A;   CHOLECYSTECTOMY N/A 06/16/2022   Procedure: LAPAROSCOPIC  CHOLECYSTECTOMY;  Surgeon: Signe Mitzie LABOR, MD;  Location: [facility];  Service: General;  Laterality: N/A;   TONSILLECTOMY AND ADENOIDECTOMY      OB History     Gravida  2   Para  2   Term  2   Preterm      AB      Living  2      SAB      IAB      Ectopic      Multiple  0   Live Births  2            Home Medications    Prior to Admission medications   Medication Sig Start Date End Date Taking? Authorizing Provider  methylPREDNISolone  (MEDROL  DOSEPAK) 4 MG TBPK tablet Use as directed. 08/17/24  Yes Melvenia Manus BRAVO, MD  albuterol  (VENTOLIN  HFA) 108 (90 Base) MCG/ACT inhaler INHALE 2 PUFFS BY MOUTH EVERY 4 HOURS AS NEEDED FOR WHEEZING OR SHORTNESS OF BREATH OR COUGH. Make appointment to be seen Patient  taking differently: 2 puffs as needed. INHALE 2 PUFFS BY MOUTH EVERY 4 HOURS AS NEEDED FOR WHEEZING OR SHORTNESS OF BREATH OR COUGH. Make appointment to be seen 06/07/24   Reddick, Johnathan B, NP  alum & mag hydroxide-simeth (MAALOX MAX) 400-400-40 MG/5ML suspension Take 10 mLs by mouth every 6 (six) hours as needed for indigestion (20 min prior to meals as needed). 06/25/24   Dreama, [place]  N, FNP  azelastine  (ASTELIN ) 0.1 % nasal spray Place 2 sprays into both nostrils 2 (two) times daily. Use in each nostril as directed 06/16/24   Alena Morrison, Reagan, MD  Blood Pressure KIT Please use to check blood pressure as needed. 07/15/23   Delores Suzann HERO, MD  cetirizine  (ZYRTEC ) 10 MG tablet Take 1 tablet (10 mg total) by mouth 2 (two) times daily. 08/11/24   Jeneal Danita Macintosh, MD  clonazePAM (KLONOPIN) 1 MG tablet Take 1 mg by mouth daily as needed for anxiety.    [provider]  EPINEPHrine  (AUVI-Q ) 0.3 mg/0.3 mL IJ SOAJ injection Inject 0.3 mg into the muscle as needed for anaphylaxis. 07/08/24   Tharon Lung, MD  esomeprazole  (NEXIUM ) 20 MG capsule Take 1 capsule (20 mg total) by mouth daily. Patient not taking: Reported on 08/11/2024 06/16/24 09/14/24  Alena Morrison, Reagan, MD  etonogestrel  (NEXPLANON ) 68 MG IMPL implant 1 each by Subdermal route once.    [provider]  famotidine  (PEPCID ) 20 MG tablet Take 1 tablet (20 mg total) by mouth 2 (two) times daily. 08/11/24   Jeneal Danita Macintosh, MD  ferrous sulfate  (FEROSUL) 325 (65 FE) MG tablet Take 1 tablet (325 mg total) by mouth every other day. 03/04/24   Jennelle Riis, MD  FLUoxetine  (PROZAC ) 20 MG tablet Take 20 mg by mouth daily.    [provider]  fluticasone  (FLONASE ) 50 MCG/ACT nasal spray Place 2 sprays into both nostrils daily. 04/25/24   Jennelle Riis, MD  fluticasone  (FLOVENT  HFA) 110 MCG/ACT inhaler Inhale 1 puff into the lungs as needed. 10/04/22   [provider]  fluticasone -salmeterol (ADVAIR HFA) 115-21 MCG/ACT inhaler Inhale 2 puffs into the lungs 2 (two) times daily. Patient not taking: Reported on 08/11/2024 04/12/24   Kara Dorn NOVAK, MD  hydrocortisone  2.5 % cream Apply topically 2 (two) times daily. Patient taking differently: Apply topically as needed. 06/01/24   Enedelia Dorna HERO, FNP  hydroxypropyl methylcellulose / hypromellose (ISOPTO TEARS / GONIOVISC) 2.5 % ophthalmic solution Place 1 drop into both eyes 4 (four) times daily as needed for dry eyes. 10/24/23   Raspet, Erin K, [name]  hydrOXYzine  (ATARAX ) 10 MG tablet Take 10 mg by mouth. 10/04/22   [provider]  montelukast  (SINGULAIR ) 10 MG tablet Take 1 tablet (10 mg total) by mouth at bedtime. 08/11/24   Jeneal Danita Macintosh, MD  polyethylene glycol (MIRALAX   / GLYCOLAX ) 17 g packet Take 17 g by mouth 2 (two) times daily. Patient taking differently: Take 17 g by mouth as needed. 06/16/24   Alena Morrison, Reagan, MD  senna (SENOKOT) 8.6 MG TABS tablet Take 1 tablet (8.6 mg total) by mouth 2 (two) times daily. Patient taking differently: Take 1 tablet by mouth as needed. 05/15/23   Macario Dorothyann HERO, MD  triamcinolone  ointment (KENALOG ) 0.1 % Apply 1 Application topically 2 (two) times daily. Patient taking differently: Apply 1 Application topically as needed. 07/04/24   Reddick, Johnathan B, NP  trimethoprim -polymyxin b  (POLYTRIM ) ophthalmic solution Place 1 drop into the left eye every 6 (six)  hours. Patient taking differently: Place 1 drop into the left eye as needed. 10/24/23   Raspet, Rocky POUR, [name]    Family History Family History  Problem Relation Age of Onset   Diabetes Mother    Hypertension Mother    Obesity Mother    Heart attack Mother 64       Death   Allergic rhinitis Sister    Other Sister        per-diabetic   Esophageal cancer Neg Hx    Colon cancer Neg Hx    Liver disease Neg Hx     Social History Social History   Tobacco Use   Smoking status: Former    Types: Cigarettes   Smokeless tobacco: Never  Vaping Use   Vaping status: Never Used  Substance Use Topics   Alcohol use: Not Currently   Drug use: Not Currently    Comment: last used in teens     Allergies   Morphine , Ibuprofen , and Morphine  and codeine    Review of Systems Review of Systems  Constitutional:  Negative for chills, fatigue and fever.  Gastrointestinal:  Negative for blood in stool, constipation, diarrhea, nausea and vomiting.  Genitourinary:  Negative for dysuria, flank pain, hematuria, vaginal bleeding, vaginal discharge and vaginal pain.  Musculoskeletal:  Positive for back pain.     Physical Exam Triage Vital Signs ED Triage Vitals  Encounter Vitals Group     BP 08/17/24 1122 105/72     Girls Systolic BP Percentile --      Girls  Diastolic BP Percentile --      Boys Systolic BP Percentile --      Boys Diastolic BP Percentile --      Pulse Rate 08/17/24 1122 64     Resp 08/17/24 1122 18     Temp 08/17/24 1122 98.3 F (36.8 C)     Temp Source 08/17/24 1122 Oral     SpO2 08/17/24 1122 97 %     Weight --      Height --      Head Circumference --      Peak Flow --      Pain Score 08/17/24 1119 6     Pain Loc --      Pain Education --      Exclude from Growth Chart --    No data found.  Updated Vital Signs BP 105/72 (BP Location: Left Arm) Comment: large cuff  Pulse 64   Temp 98.3 F (36.8 C) (Oral)   Resp 18   LMP 07/21/2024 (Approximate)   SpO2 97%   Physical Exam Vitals reviewed.  Constitutional:      General: She is not in acute distress.    Appearance: Normal appearance. She is obese. She is not ill-appearing or toxic-appearing.  Cardiovascular:     Rate and Rhythm: Normal rate and regular rhythm.     Pulses: Normal pulses.     Heart sounds: Normal heart sounds.  Pulmonary:     Effort: Pulmonary effort is normal.     Breath sounds: Normal breath sounds.  Abdominal:     General: Abdomen is flat. Bowel sounds are normal.     Palpations: Abdomen is soft.     Tenderness: There is no abdominal tenderness. There is no right CVA tenderness or left CVA tenderness.  Musculoskeletal:     Comments: No obvious deformity on inspection of the lumbar spine TTP over the paraspinal muscles of the left lumbar region Range of motion generally  intact, pain elicited with extension and lateral bending to the left 5/5 lower extremity strength bilaterally Negative SLR bilaterally  Neurological:     Mental Status: She is alert.    UC Treatments / Results  Labs (all labs ordered are listed, but only abnormal results are displayed) Labs Reviewed  POCT URINALYSIS DIP (MANUAL ENTRY) - Abnormal; Notable for the following components:      Result Value   Clarity, UA cloudy (*)    Spec Grav, UA <=1.005 (*)    All  other components within normal limits  POCT URINE PREGNANCY    EKG   Radiology No results found.  Procedures Procedures (including critical care time)  Medications Ordered in UC Medications  ketorolac  (TORADOL ) 30 MG/ML injection 30 mg (30 mg Intramuscular Given 08/17/24 1226)    Initial Impression / Assessment and Plan / UC Course  I have reviewed the triage vital signs and the nursing notes.  Pertinent labs & imaging results that were available during my care of the patient were reviewed by me and considered in my medical decision making (see chart for details).    Patient presents to urgent care endorsing a 3-day history of left lumbar back pain.  There is mild tenderness palpation of the paraspinal muscles of the left lumbar region on exam.  No abdominal tenderness noted.  Range of motion otherwise are generally intact, however pain is elicited with lateral bending to the left and extension at the waist.  Negative SLR bilaterally.  POC UA is not consistent with infection or otherwise concerning findings.  POC urine pregnancy test is negative.  These results were reviewed with the patient.  Her symptoms are likely attributable to a lumbar strain.  Treatment options reviewed.  She was given an IM Toradol  30 mg injection in urgent care today.  I also prescribed a Medrol  Dosepak to help with pain relief.  She was also given home PT exercises in written form which will help with rehabilitation and strengthening.  She will return to care if pain worsens or fails to improve.  Patient is agreeable to this plan.  She is medically stable for discharge at this time.  Final Clinical Impressions(s) / UC Diagnoses   Final diagnoses:  Thoracolumbar back pain     Discharge Instructions      You have a strain in the left lower part of your back. You will be given an injection for pain relief before leaving today. I have also prescribed a steroid pack for pain relief. Please see the attached  exercises that will help with healing and strengthening to prevent future injury. Return to care if pain worsens or does improve.     ED Prescriptions     Medication Sig Dispense Auth. Provider   methylPREDNISolone  (MEDROL  DOSEPAK) 4 MG TBPK tablet Use as directed. 21 each Melvenia Manus BRAVO, MD      PDMP not reviewed this encounter.   Melvenia Manus BRAVO, MD 08/17/24 [phone]

## 2024-08-17 NOTE — ED Triage Notes (Signed)
 Onset 3 days ago of back pain and abdominal cramping.  Points to left mid to lower back pain.  Cramping is upper abdomen.  This is intermittent.  Denies burning with urination, but admits to urinating more frequently.    Has not had any medications for symptoms

## 2024-08-18 ENCOUNTER — Encounter: Payer: Self-pay | Admitting: Student

## 2024-08-18 ENCOUNTER — Ambulatory Visit: Admitting: Student

## 2024-08-18 VITALS — BP 104/73 | HR 76 | Ht 62.0 in | Wt 238.0 lb

## 2024-08-18 DIAGNOSIS — M545 Low back pain, unspecified: Secondary | ICD-10-CM

## 2024-08-18 DIAGNOSIS — Z23 Encounter for immunization: Secondary | ICD-10-CM | POA: Diagnosis not present

## 2024-08-18 NOTE — Progress Notes (Signed)
    SUBJECTIVE:   CHIEF COMPLAINT / HPI:   Low back pain Resolving, responded well to Toradol  and urgent care yesterday.  She presents because she has questions of whether not she can take her methylprednisolone  Dosepak per urgent care provided with her current medications.  We went through all of her medications, and it is safe for her to take her current medications in addition to methylprednisolone  Dosepak.  She is also interested in seeing physical therapy.   OBJECTIVE:   BP 104/73   Pulse 76   Ht 5' 2 (1.575 m)   Wt 238 lb (108 kg)   LMP 07/21/2024 (Approximate)   SpO2 100%   BMI 43.53 kg/m    General: NAD, pleasant Cardio: RRR, no MRG.  Respiratory: CTAB, normal wob on RA MSK: No TTP, moving back without issues. Skin: Warm and dry  ASSESSMENT/PLAN:   Assessment & Plan Acute right-sided low back pain without sciatica Improving. - Continue methylprednisolone  Dosepak from urgent care - Referral to physical therapy - Follow-up if symptoms fail to improve or worsen Encounter for immunization -Flu shot today   Follow-up recommendations Patient needs annual visit and Pap smear, recommended she schedule appointment   Gladis Church, DO [facility]

## 2024-08-18 NOTE — Assessment & Plan Note (Signed)
 Improving. - Continue methylprednisolone  Dosepak from urgent care - Referral to physical therapy - Follow-up if symptoms fail to improve or worsen

## 2024-08-18 NOTE — Patient Instructions (Addendum)
 It was great to see you! Thank you for allowing me to participate in your care!   I recommend that you always bring your medications to each appointment as this makes it easy to ensure we are on the correct medications and helps us  not miss when refills are needed.  Our plans for today:  - It is okay to take the methylprednisolone  with your montelukast  - I recommend to schedule appointment for your annual visit and Pap smear with PCP  Take care and seek immediate care sooner if you develop any concerns. Please remember to show up 15 minutes before your scheduled appointment time!  Gladis Church, DO [facility]

## 2024-08-19 ENCOUNTER — Other Ambulatory Visit

## 2024-08-19 ENCOUNTER — Ambulatory Visit: Admitting: Nurse Practitioner

## 2024-08-22 DIAGNOSIS — F410 Panic disorder [episodic paroxysmal anxiety] without agoraphobia: Secondary | ICD-10-CM | POA: Diagnosis not present

## 2024-08-27 ENCOUNTER — Encounter (HOSPITAL_COMMUNITY): Payer: Self-pay

## 2024-08-27 ENCOUNTER — Other Ambulatory Visit: Payer: Self-pay

## 2024-08-27 ENCOUNTER — Emergency Department (HOSPITAL_COMMUNITY)
Admission: EM | Admit: 2024-08-27 | Discharge: 2024-08-27 | Disposition: A | Discharge status: Home / Self Care | Attending: Emergency Medicine | Admitting: Emergency Medicine

## 2024-08-27 DIAGNOSIS — Z7951 Long term (current) use of inhaled steroids: Secondary | ICD-10-CM | POA: Insufficient documentation

## 2024-08-27 DIAGNOSIS — J45909 Unspecified asthma, uncomplicated: Secondary | ICD-10-CM | POA: Diagnosis not present

## 2024-08-27 DIAGNOSIS — R059 Cough, unspecified: Secondary | ICD-10-CM | POA: Diagnosis present

## 2024-08-27 LAB — RESP PANEL BY RT-PCR (RSV, FLU A&B, COVID)  RVPGX2
Influenza A by PCR: NEGATIVE
Influenza B by PCR: NEGATIVE
Resp Syncytial Virus by PCR: NEGATIVE
SARS Coronavirus 2 by RT PCR: NEGATIVE

## 2024-08-27 MED ORDER — PREDNISONE 50 MG PO TABS
ORAL_TABLET | ORAL | 0 refills | Status: DC
Start: 2024-08-27 — End: 2024-09-02

## 2024-08-27 MED ORDER — ALBUTEROL SULFATE HFA 108 (90 BASE) MCG/ACT IN AERS: 2.0000 | INHALATION_SPRAY | Freq: Four times a day (QID) | RESPIRATORY_TRACT | 2 refills | Status: AC | PRN

## 2024-08-27 MED ORDER — IPRATROPIUM-ALBUTEROL 0.5-2.5 (3) MG/3ML IN SOLN
3.0000 mL | Freq: Once | RESPIRATORY_TRACT | Status: AC
  Administered 2024-08-27: 3 mL via RESPIRATORY_TRACT
  Filled 2024-08-27: qty 3

## 2024-08-27 MED ORDER — PREDNISONE 20 MG PO TABS
60.0000 mg | ORAL_TABLET | Freq: Once | ORAL | Status: AC
  Administered 2024-08-27: 60 mg via ORAL
  Filled 2024-08-27: qty 3

## 2024-08-27 NOTE — Discharge Instructions (Addendum)
 Return if any problems.

## 2024-08-27 NOTE — ED Notes (Signed)
 Pt completely assessed by EDP and set to discharge. No RN assessment performed.  Pt provided discharge instructions and prescription information. Pt was given the opportunity to ask questions and questions were answered.

## 2024-08-27 NOTE — ED Triage Notes (Signed)
 Pt arrived POV from home c/o congestion, cough and bilateral ear aches. 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 Pt has a hx of asthma. Pt has had symptoms since Thursday.

## 2024-08-27 NOTE — ED Provider Notes (Signed)
 [place] EMERGENCY DEPARTMENT AT [facility] Provider Note   CSN: [id number] Arrival date & time: 08/27/24  9146     Patient presents with: Nasal Congestion   Veronica [name] is a 33 y.o. female.   Pt complains of exacerbation of asthma.  Pt reports she has a new job and is exposed to dust and chemicals, Pt complains of nasal congestion.  No fever or chills.  Pt has not been using her inhaler.    The history is provided by the patient.       Prior to Admission medications   Medication Sig Start Date End Date Taking? Authorizing Provider  albuterol  (VENTOLIN  HFA) 108 (90 Base) MCG/ACT inhaler Inhale 2 puffs into the lungs every 6 (six) hours as needed for wheezing or shortness of breath. 08/27/24  Yes Jaythan Hinely K, [name]  predniSONE  (DELTASONE ) 50 MG tablet One tablet a day 08/27/24  Yes Diallo Ponder K, [name]  alum & mag hydroxide-simeth (MAALOX MAX) 400-400-40 MG/5ML suspension Take 10 mLs by mouth every 6 (six) hours as needed for indigestion (20 min prior to meals as needed). 06/25/24   Dreama, Georgia  N, FNP  azelastine  (ASTELIN ) 0.1 % nasal spray Place 2 sprays into both nostrils 2 (two) times daily. Use in each nostril as directed 06/16/24   Alena Morrison, Reagan, MD  Blood Pressure KIT Please use to check blood pressure as needed. 07/15/23   Delores Suzann HERO, MD  cetirizine  (ZYRTEC ) 10 MG tablet Take 1 tablet (10 mg total) by mouth 2 (two) times daily. 08/11/24   Jeneal Danita Macintosh, MD  clonazePAM (KLONOPIN) 1 MG tablet Take 1 mg by mouth daily as needed for anxiety.    [provider]  EPINEPHrine  (AUVI-Q ) 0.3 mg/0.3 mL IJ SOAJ injection Inject 0.3 mg into the muscle as needed for anaphylaxis. 07/08/24   Tharon Lung, MD  esomeprazole  (NEXIUM ) 20 MG capsule Take 1 capsule (20 mg total) by mouth daily. Patient not taking: Reported on 08/11/2024 06/16/24 09/14/24  Alena Morrison, Reagan, MD  etonogestrel  (NEXPLANON ) 68 MG IMPL implant 1 each by Subdermal  route once.    [provider]  famotidine  (PEPCID ) 20 MG tablet Take 1 tablet (20 mg total) by mouth 2 (two) times daily. 08/11/24   Jeneal Danita Macintosh, MD  ferrous sulfate  (FEROSUL) 325 (65 FE) MG tablet Take 1 tablet (325 mg total) by mouth every other day. 03/04/24   Jennelle Riis, MD  FLUoxetine  (PROZAC ) 20 MG tablet Take 20 mg by mouth daily.    [provider]  fluticasone  (FLONASE ) 50 MCG/ACT nasal spray Place 2 sprays into both nostrils daily. 04/25/24   Jennelle Riis, MD  fluticasone  (FLOVENT  HFA) 110 MCG/ACT inhaler Inhale 1 puff into the lungs as needed. 10/04/22   [provider]  fluticasone -salmeterol (ADVAIR HFA) 115-21 MCG/ACT inhaler Inhale 2 puffs into the lungs 2 (two) times daily. Patient not taking: Reported on 08/11/2024 04/12/24   Kara Dorn NOVAK, MD  hydrocortisone  2.5 % cream Apply topically 2 (two) times daily. Patient taking differently: Apply topically as needed. 06/01/24   Enedelia Dorna HERO, FNP  hydroxypropyl methylcellulose / hypromellose (ISOPTO TEARS / GONIOVISC) 2.5 % ophthalmic solution Place 1 drop into both eyes 4 (four) times daily as needed for dry eyes. 10/24/23   Raspet, Erin K, [name]  hydrOXYzine  (ATARAX ) 10 MG tablet Take 10 mg by mouth. 10/04/22   [provider]  methylPREDNISolone  (MEDROL  DOSEPAK) 4 MG TBPK tablet Use as directed. 08/17/24  Melvenia Manus BRAVO, MD  montelukast  (SINGULAIR ) 10 MG tablet Take 1 tablet (10 mg total) by mouth at bedtime. 08/11/24   Jeneal Danita Macintosh, MD  polyethylene glycol (MIRALAX  / GLYCOLAX ) 17 g packet Take 17 g by mouth 2 (two) times daily. Patient taking differently: Take 17 g by mouth as needed. 06/16/24   Alena Morrison, Reagan, MD  senna (SENOKOT) 8.6 MG TABS tablet Take 1 tablet (8.6 mg total) by mouth 2 (two) times daily. Patient taking differently: Take 1 tablet by mouth as needed. 05/15/23   Macario Dorothyann HERO, MD  triamcinolone  ointment (KENALOG ) 0.1 % Apply 1  Application topically 2 (two) times daily. Patient taking differently: Apply 1 Application topically as needed. 07/04/24   Reddick, Johnathan B, NP  trimethoprim -polymyxin b  (POLYTRIM ) ophthalmic solution Place 1 drop into the left eye every 6 (six) hours. Patient taking differently: Place 1 drop into the left eye as needed. 10/24/23   Raspet, Erin K, [name]    Allergies: Morphine , Ibuprofen , and Morphine  and codeine     Review of Systems  All other systems reviewed and are negative.   Updated Vital Signs BP 122/76 (BP Location: Right Arm)   Pulse 78   Temp 98.2 F (36.8 C)   Resp 17   Ht 5' 2 (1.575 m)   Wt 104.3 kg   LMP 07/21/2024 (Approximate)   SpO2 98%   BMI 42.07 kg/m   Physical Exam Vitals and nursing note reviewed.  Constitutional:      Appearance: She is well-developed.  HENT:     Head: Normocephalic.     Nose: Nose normal.     Mouth/Throat:     Mouth: Mucous membranes are moist.  Cardiovascular:     Rate and Rhythm: Normal rate.  Pulmonary:     Effort: Pulmonary effort is normal.  Abdominal:     General: There is no distension.  Musculoskeletal:        General: Normal range of motion.     Cervical back: Normal range of motion.  Skin:    General: Skin is warm.  Neurological:     General: No focal deficit present.     Mental Status: She is alert and oriented to person, place, and time.     (all labs ordered are listed, but only abnormal results are displayed) Labs Reviewed  RESP PANEL BY RT-PCR (RSV, FLU A&B, COVID)  [id number]    EKG: None  Radiology: No results found.   Procedures   Medications Ordered in the ED  predniSONE  (DELTASONE ) tablet 60 mg (60 mg Oral Given 08/27/24 1027)  ipratropium-albuterol  (DUONEB) 0.5-2.5 (3) MG/3ML nebulizer solution 3 mL (3 mLs Nebulization Given 08/27/24 1028)                                    Medical Decision Making Patient complains of exposure to dust and chemicals at work.  Patient feels like this has  triggered her asthma.  Patient reports she has also had nasal congestion.  Amount and/or Complexity of Data Reviewed Labs: ordered. Decision-making details documented in ED Course.    Details: Labs ordered reviewed and interpreted.  Patient's flu COVID and RSV are negative.  Risk Prescription drug management. Risk Details: Patient given a DuoNeb here.  Patient reports feeling better she is given 60 mg of prednisone .  Patient is given a prescription for an albuterol  inhaler and prednisone .  She is advised to use  her inhaler every 4 hours for the next 3 days then as needed.  Patient advised to return to the emergency department if any problems.        Final diagnoses:  Moderate asthma, unspecified whether complicated, unspecified whether persistent    ED Discharge Orders          Ordered    predniSONE  (DELTASONE ) 50 MG tablet        08/27/24 1209    albuterol  (VENTOLIN  HFA) 108 (90 Base) MCG/ACT inhaler  Every 6 hours PRN        08/27/24 1209           An After Visit Summary was printed and given to the patient.     Flint Sonny MARLA DEVONNA 08/27/24 1224    Dean Clarity, MD 08/27/24 [phone]

## 2024-09-02 ENCOUNTER — Ambulatory Visit (INDEPENDENT_AMBULATORY_CARE_PROVIDER_SITE_OTHER)

## 2024-09-02 VITALS — BP 105/76 | HR 80 | Ht 62.0 in | Wt 230.8 lb

## 2024-09-02 DIAGNOSIS — J301 Allergic rhinitis due to pollen: Secondary | ICD-10-CM | POA: Diagnosis not present

## 2024-09-02 DIAGNOSIS — J4521 Mild intermittent asthma with (acute) exacerbation: Secondary | ICD-10-CM | POA: Diagnosis not present

## 2024-09-02 MED ORDER — BUDESONIDE-FORMOTEROL FUMARATE 160-4.5 MCG/ACT IN AERO: 2.0000 | INHALATION_SPRAY | Freq: Two times a day (BID) | RESPIRATORY_TRACT | 3 refills | Status: AC

## 2024-09-02 NOTE — Patient Instructions (Addendum)
 Your allergist recommended: - Increase Zyrtec  10mg  1 tab to twice daily. - Continue Pepcid  20mg  1 tab twice daily. - Start montelukast  (Singulair ) 10mg  1 tab at night.   I would recommend taking these medications exactly what she said.  Hopefully this will help your eye irritation as well.  You can try the Isopto Tears eyedrops that you were previously prescribed in the meantime, but I think getting your allergic rhinitis and other symptoms under good control will also help with your eye irritation.  For your asthma, please call your pulmonologist (lung doctor), Dr. Kara, to continue to have treatment for this.  I have sent a prescription for Symbicort  which she reported approved and 2 puffs in the morning and 2 puffs in the evening for controller treatment of your asthma since the advair made things worse.   Please follow-up with Dr. Marella, your PCP, in October to see how you are doing with this new medication.  Please reach out to your allergy doctor about help with the letter you are requesting.  It was good seeing you today, [name], MD

## 2024-09-02 NOTE — Assessment & Plan Note (Signed)
 She has poorly controlled asthma for which she is only using an albuterol  inhaler as needed. Prescribed Symbicort  2 puffs twice a day as she has failed treatment with Advair. Again encouraged her to call her pulmonologist for follow-up. Regarding the letter to say that she does not have an allergy to dust, I advised that she reach out to the allergist via MyChart for explanation of her allergy testing and further letter writing. I advised her to continue with the Zyrtec  and montelukast  recommended by the allergist. She is also taking fluticasone .  She can try lubricating eyedrops that she has at home for her eye irritation; however, I recommended consistent use of the antihistamine, montelukast , and nasal spray medications for symptom control.  I scheduled her follow-up in 2 weeks with her PCP for evaluation of her asthma with this new medication, to see if she has gotten into see the pulmonologist, and to meet Dr. Lonnie.

## 2024-09-02 NOTE — Progress Notes (Signed)
    SUBJECTIVE:   CHIEF COMPLAINT / HPI:   This is an ED follow up from 9/20 for asthma flare, given duoneb in the ER, oral prednisone  50 mg x5 (also medrol  dosepak on 08/17/24) and albuterol  inhaler.  She has completed the oral prednisone  and is using the albuterol  inhaler.  She does not take any controller medication, as the Advair caused chest tightness.  She has a regular cough that is worse when she is in the heat or outside. Saw allergist on 08/11/24 who added montelukast  and increased her Zyrtec  to twice a day.  She underwent extensive lab allergy testing but has not been in the results.  She is requesting a note for work today that she is not allergic to dust.  She is also hoping to get a controller inhaler for her poorly controlled asthma, since she did not tolerate the Advair.  I also previously saw her in July at which time I recommended following up with pulm for asthma.  She last saw them in May of this year.   PERTINENT  PMH / PSH: Asthma, allergic rhinitis, chronic urticaria, GERD  OBJECTIVE:   BP 105/76   Pulse 80   Ht 5' 2 (1.575 m)   Wt 230 lb 12.8 oz (104.7 kg)   LMP 07/21/2024 (Approximate)   SpO2 99%   BMI 42.21 kg/m    General: Well-appearing female in no acute distress CV: Regular rate and rhythm, no murmurs Respiratory: Breathing comfortably on room air, able to talk in complete sentences without any signs of shortness of breath, no wheezes, lungs clear to auscultation bilaterally  ASSESSMENT/PLAN:   Assessment & Plan Mild intermittent asthma with acute exacerbation Allergic rhinitis due to pollen, unspecified seasonality She has poorly controlled asthma for which she is only using an albuterol  inhaler as needed. Prescribed Symbicort  2 puffs twice a day as she has failed treatment with Advair. Again encouraged her to call her pulmonologist for follow-up. Regarding the letter to say that she does not have an allergy to dust, I advised that she reach out to the  allergist via MyChart for explanation of her allergy testing and further letter writing. I advised her to continue with the Zyrtec  and montelukast  recommended by the allergist. She is also taking fluticasone .  She can try lubricating eyedrops that she has at home for her eye irritation; however, I recommended consistent use of the antihistamine, montelukast , and nasal spray medications for symptom control.  I scheduled her follow-up in 2 weeks with her PCP for evaluation of her asthma with this new medication, to see if she has gotten into see the pulmonologist, and to meet Dr. Lonnie.     Charlayne Vultaggio Alena Morrison, MD [facility]

## 2024-09-06 ENCOUNTER — Telehealth: Payer: Self-pay | Admitting: Allergy

## 2024-09-06 NOTE — Telephone Encounter (Signed)
 Pt called to get her test results discussed because she don't understand them and requested a call back.

## 2024-09-08 ENCOUNTER — Ambulatory Visit: Admitting: Pulmonary Disease

## 2024-09-09 ENCOUNTER — Ambulatory Visit

## 2024-09-09 ENCOUNTER — Ambulatory Visit: Payer: Self-pay | Admitting: Allergy

## 2024-09-09 DIAGNOSIS — Z111 Encounter for screening for respiratory tuberculosis: Secondary | ICD-10-CM

## 2024-09-09 NOTE — Progress Notes (Signed)
 Patient is here for a PPD placement.  PPD placed in left forearm @ 10:40 am.  Patient will return 09/12/2024 to have PPD read.   Chiquita JAYSON English, RN

## 2024-09-12 ENCOUNTER — Encounter: Payer: Self-pay | Admitting: Pulmonary Disease

## 2024-09-12 ENCOUNTER — Ambulatory Visit (INDEPENDENT_AMBULATORY_CARE_PROVIDER_SITE_OTHER): Admitting: Pulmonary Disease

## 2024-09-12 ENCOUNTER — Ambulatory Visit

## 2024-09-12 VITALS — BP 118/60 | HR 66 | Temp 98.7°F | Ht 62.0 in | Wt 234.0 lb

## 2024-09-12 DIAGNOSIS — Z111 Encounter for screening for respiratory tuberculosis: Secondary | ICD-10-CM

## 2024-09-12 DIAGNOSIS — J4520 Mild intermittent asthma, uncomplicated: Secondary | ICD-10-CM | POA: Diagnosis not present

## 2024-09-12 LAB — TB SKIN TEST
Induration: 0 mm
TB Skin Test: NEGATIVE

## 2024-09-12 LAB — POCT EXHALED NITRIC OXIDE: FeNO level (ppb): 14

## 2024-09-12 NOTE — Progress Notes (Signed)
PPD Reading Note PPD read and results entered in EpicCare. Result: 0 mm induration. Interpretation: Negative Allergic reaction: No  

## 2024-09-12 NOTE — Telephone Encounter (Signed)
 Addressed in Result follow up

## 2024-09-12 NOTE — Progress Notes (Signed)
 Veronica [name]    [id number]    November 20, 1991  Primary Care Physician:Baloch, Mahnoor, MD  Referring Physician: Lonnie Earnest, MD [address] [place],  [place] [address]  Chief complaint: Acute visit for dyspnea  HPI: 32 y.o. who  has a past medical history of Abdominal pain (01/06/2012), Acute tension headache (07/27/2019), Anemia, Ankle pain, Anxiety, Asthma, Birth control counseling (08/26/2011), BMI 40.0-44.9, adult (HCC) (02/27/2011), Dysuria (01/13/2013), Epigastric pain (09/13/2019), GERD (gastroesophageal reflux disease), Heart murmur, Hiatal hernia, Obesity (BMI 30-39.9), Pre-eclampsia, Secondary amenorrhea (03/25/2018), and Trichomonas infection (06/17/2022).  Discussed the use of AI scribe software for clinical note transcription with the patient, who gave verbal consent to proceed.  History of Present Illness Veronica [name] is a 33 year old female with asthma who presents with shortness of breath and allergic reactions.  Dyspnea and wheezing - Asthma onset after COVID-19 infection in 2020, resulting in recurrent shortness of breath and multiple urgent care visits. - Shortness of breath is triggered by exposure to cats, dogs, dust, and pollen. - Required emergency room visit for acute shortness of breath after cat exposure. 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 - Current asthma management includes albuterol , montelukast , and prednisone . - Previously used Flovent  and Advair; Advair worsened breathing. - Symbicort  prescribed by her primary care but not yet initiated. - No tobacco use.  Allergic symptoms - Significant allergies to dogs, cats, grass, mold, tree pollen, and ragweed confirmed by allergist evaluation. - Facial breakouts occur, unclear if related to asthma or allergies. - Sneezing with dust and pollen exposure. - Owns a dog and previously worked with cats, both of which exacerbate symptoms.  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 - Previous work with cats and ongoing exposure to a pet dog at home. - Younger child is attached to the family dog, complicating allergen avoidance.  Relevant Pulmonary history: Pets: Dog  Occupation: Used to work in a Human resources officer company Exposures: No mold, hot tub, Financial controller.  No feather pillow comforter No h/o chemo/XRT/amiodarone/macrodantin/MTX  No exposure to asbestos, silica or other organic allergens  Smoking history: Never smoker Travel history: No significant travel history Family history: No family history of lung disease   Outpatient Encounter Medications as of 09/12/2024  Medication Sig   albuterol  (VENTOLIN  HFA) 108 (90 Base) MCG/ACT inhaler Inhale 2 puffs into the lungs every 6 (six) hours as needed for wheezing or shortness of breath.   alum & mag hydroxide-simeth (MAALOX MAX) 400-400-40 MG/5ML suspension Take 10 mLs by mouth every 6 (six) hours as needed for indigestion (20 min prior to meals as needed).   Blood Pressure KIT Please use to check blood pressure as needed.   cetirizine  (ZYRTEC ) 10 MG tablet Take 1 tablet (10 mg total) by mouth 2 (two) times daily.   clonazePAM (KLONOPIN) 1 MG tablet Take 1 mg by mouth daily as needed for anxiety.   EPINEPHrine  (AUVI-Q ) 0.3 mg/0.3 mL IJ SOAJ injection Inject 0.3 mg into the muscle as needed for anaphylaxis.   etonogestrel  (NEXPLANON ) 68 MG IMPL implant 1 each by Subdermal route once.   famotidine  (PEPCID ) 20 MG tablet Take 1 tablet (20 mg total) by mouth 2 (two) times daily.   ferrous sulfate  (FEROSUL) 325 (65 FE) MG tablet Take 1 tablet (325 mg total) by mouth every other day.   FLUoxetine  (PROZAC ) 20 MG tablet Take 20 mg by mouth  daily.   fluticasone  (FLONASE ) 50 MCG/ACT nasal spray Place 2 sprays into both nostrils daily.   hydrocortisone  2.5 % cream Apply topically 2 (two) times daily.   hydrOXYzine  (ATARAX ) 10 MG tablet Take 10 mg by  mouth.   polyethylene glycol (MIRALAX  / GLYCOLAX ) 17 g packet Take 17 g by mouth 2 (two) times daily.   senna (SENOKOT) 8.6 MG TABS tablet Take 1 tablet (8.6 mg total) by mouth 2 (two) times daily.   triamcinolone  ointment (KENALOG ) 0.1 % Apply 1 Application topically 2 (two) times daily.   azelastine  (ASTELIN ) 0.1 % nasal spray Place 2 sprays into both nostrils 2 (two) times daily. Use in each nostril as directed (Patient not taking: Reported on 09/12/2024)   budesonide -formoterol  (SYMBICORT ) 160-4.5 MCG/ACT inhaler Inhale 2 puffs into the lungs 2 (two) times daily. (Patient not taking: Reported on 09/12/2024)   esomeprazole  (NEXIUM ) 20 MG capsule Take 1 capsule (20 mg total) by mouth daily. (Patient not taking: Reported on 09/12/2024)   fluticasone  (FLOVENT  HFA) 110 MCG/ACT inhaler Inhale 1 puff into the lungs as needed. (Patient not taking: Reported on 09/12/2024)   fluticasone -salmeterol (ADVAIR HFA) 115-21 MCG/ACT inhaler Inhale 2 puffs into the lungs 2 (two) times daily. (Patient not taking: Reported on 09/12/2024)   hydroxypropyl methylcellulose / hypromellose (ISOPTO TEARS / GONIOVISC) 2.5 % ophthalmic solution Place 1 drop into both eyes 4 (four) times daily as needed for dry eyes. (Patient not taking: Reported on 09/12/2024)   montelukast  (SINGULAIR ) 10 MG tablet Take 1 tablet (10 mg total) by mouth at bedtime. (Patient not taking: Reported on 09/12/2024)   trimethoprim -polymyxin b  (POLYTRIM ) ophthalmic solution Place 1 drop into the left eye every 6 (six) hours. (Patient not taking: Reported on 09/12/2024)   No facility-administered encounter medications on file as of 09/12/2024.     Physical Exam: Today's Vitals   09/12/24 1545  Weight: 234 lb (106.1 kg)  Height: 5' 2 (1.575 m)   Body mass index is 42.8 kg/m.  Physical Exam GEN: No acute distress. CV: Regular rate and rhythm, no murmurs. LUNGS: Clear to auscultation bilaterally, normal respiratory effort. SKIN JOINTS: Warm and dry,  no rash.    Data Reviewed: Imaging: CTA 05/13/2023-no pulmonary embolism.  Lungs are clear.  Small hiatal hernia. Chest x-ray 06/11/2024-no active cardiopulmonary disease I have reviewed the images personally.  PFTs: 06/09/2022 FVC 3.12 [88%], FEV1 2.75 [91%], F/F88, TLC 4.59 [96%], DLCO 24.54 [117%]  Normal test  FENO 09/12/2024-14  Labs: CBC/03/2024-WBC 6.9, eos 2%, absolute eosinophil count 138 RAST panel 9//2025-high IgE to grass, pollen, mold, cat, dog dander, tree pollen Assessment & Plan Asthma with environmental allergies Asthma exacerbated by environmental allergies, including high IgE levels to grass, mold, cat, dog, and tree pollen. Symptoms include shortness of breath and facial breakouts. Previous medications include Flovent  and Advair, with Advair worsening symptoms. Currently prescribed montelukast  and Symbicort . Recent job exposure to dust led to ER visit due to shortness of breath. No evidence of dust allergy from tests. High allergy to dog, considering removal of pet. Discussed potential for allergy shots with allergist.  FeNO is persistently low indicating low airway inflammation - Start the Symbicort  already prescribed by primary care, two puffs in the morning and two puffs in the evening. - Continue montelukast . - Schedule full lung function test for follow-up. - Patient to discuss potential allergy shots with allergist. - Consider removal of dog due to high allergy.  Recommendations: Start Symbicort  PFTs  I personally spent a total of 30  minutes in the care of the patient today including preparing to see the patient, getting/reviewing separately obtained history, performing a medically appropriate exam/evaluation, counseling and educating, and placing orders.   Lonna Coder MD [facility] 09/12/2024, 3:50 PM  CC: Lonnie Earnest, MD

## 2024-09-12 NOTE — Patient Instructions (Signed)
  VISIT SUMMARY: You visited us  today due to shortness of breath and allergic reactions, primarily triggered by environmental factors such as pets, dust, and pollen. We discussed your asthma management and potential changes to your treatment plan.  YOUR PLAN: ASTHMA WITH ENVIRONMENTAL ALLERGIES: Your asthma is worsened by allergies to grass, mold, cats, dogs, and tree pollen, causing shortness of breath and facial breakouts. -Start using Symbicort , two puffs in the morning and two puffs in the evening. -Continue taking montelukast  as prescribed. -We will perform a FENO test to assess lung inflammation. -Schedule a full lung function test for follow-up. -Discuss the potential for allergy shots with your allergist. -Consider removing your dog from the home due to your high allergy to dogs.                               Contains text generated by [employer].

## 2024-09-13 ENCOUNTER — Other Ambulatory Visit: Payer: Self-pay

## 2024-09-13 ENCOUNTER — Ambulatory Visit (HOSPITAL_COMMUNITY)
Admission: EM | Admit: 2024-09-13 | Discharge: 2024-09-13 | Disposition: A | Discharge status: Home / Self Care | Attending: Family Medicine | Admitting: Family Medicine

## 2024-09-13 ENCOUNTER — Encounter (HOSPITAL_COMMUNITY): Payer: Self-pay | Admitting: Emergency Medicine

## 2024-09-13 DIAGNOSIS — L239 Allergic contact dermatitis, unspecified cause: Secondary | ICD-10-CM | POA: Diagnosis not present

## 2024-09-13 MED ORDER — DEXAMETHASONE SODIUM PHOSPHATE 10 MG/ML IJ SOLN
10.0000 mg | Freq: Once | INTRAMUSCULAR | Status: AC
  Administered 2024-09-13: 10 mg via INTRAMUSCULAR

## 2024-09-13 MED ORDER — DEXAMETHASONE SODIUM PHOSPHATE 10 MG/ML IJ SOLN
INTRAMUSCULAR | Status: AC
  Filled 2024-09-13: qty 1

## 2024-09-13 MED ORDER — PREDNISONE 20 MG PO TABS
40.0000 mg | ORAL_TABLET | Freq: Every day | ORAL | 0 refills | Status: AC
Start: 2024-09-13 — End: 2024-09-18

## 2024-09-13 NOTE — ED Triage Notes (Signed)
 Patient is complaining of rash and itching for 3 days  Patient says she has been to an allergist, but no one has explained the results to her per patient  Patient has not had any medications for this issue

## 2024-09-13 NOTE — ED Provider Notes (Signed)
 [facility]    CSN: [id number] Arrival date & time: 09/13/24  1141      History   Chief Complaint No chief complaint on file.   HPI Veronica [name] is a 33 y.o. female.   HPI Here for rash and itching, mainly on her face and neck and shoulders.  This has been going on for about 3 days.  No trouble breathing  She has had recent testing for allergies with an allergist but has not had her results reviewed with her.  She is taking Singulair  and Zyrtec  every day. Past Medical History:  Diagnosis Date   Abdominal pain 01/06/2012   Acute tension headache 07/27/2019   Anemia    Ankle pain    Anxiety    Asthma    Birth control counseling 08/26/2011   BMI 40.0-44.9, adult (HCC) 02/27/2011   Dysuria 01/13/2013   Epigastric pain 09/13/2019   GERD (gastroesophageal reflux disease)    Heart murmur    Hiatal hernia    Obesity (BMI 30-39.9)    Pre-eclampsia    off med since 4/23   Secondary amenorrhea 03/25/2018   Trichomonas infection 06/17/2022    Patient Active Problem List   Diagnosis Date Noted   Perioral hyperpigmentation 06/16/2024   Generalized pruritus 06/03/2024   Ear itching 04/25/2024   Paresthesia 04/25/2024   Light headedness 03/31/2024   Panic attacks 02/13/2024   Dizziness 01/08/2024   Constipation 05/17/2023   Nocturnal oxygen desaturation 03/23/2022   Allergic rhinitis 03/07/2022   Asthma 08/31/2020   Back pain 02/13/2020   Hiatal hernia 11/02/2019   Subclinical hypothyroidism 05/19/2019   GAD (generalized anxiety disorder) 05/09/2019   Nonintractable episodic headache 11/15/2018   Seasonal allergies 03/25/2018   Chronic cough 10/16/2017   Fatigue 05/09/2016   GERD (gastroesophageal reflux disease) 03/28/2011   BMI 40.0-44.9, adult (HCC) 02/27/2011    Past Surgical History:  Procedure Laterality Date   ADENOIDECTOMY     CESAREAN SECTION  09/02/2010   For macrosomia, but son was 7 lb 12 oz   CESAREAN SECTION N/A 10/06/2021    Procedure: CESAREAN SECTION;  Surgeon: Izell Harari, MD;  Location: [facility];  Service: Obstetrics;  Laterality: N/A;   CHOLECYSTECTOMY N/A 06/16/2022   Procedure: LAPAROSCOPIC  CHOLECYSTECTOMY;  Surgeon: Signe Mitzie LABOR, MD;  Location: [facility];  Service: General;  Laterality: N/A;   TONSILLECTOMY AND ADENOIDECTOMY      OB History     Gravida  2   Para  2   Term  2   Preterm      AB      Living  2      SAB      IAB      Ectopic      Multiple  0   Live Births  2            Home Medications    Prior to Admission medications   Medication Sig Start Date End Date Taking? Authorizing Provider  FLUoxetine  (PROZAC ) 20 MG capsule Take 20 mg by mouth daily. 07/23/24  Yes [provider]  predniSONE  (DELTASONE ) 20 MG tablet Take 2 tablets (40 mg total) by mouth daily with breakfast for 5 days. 09/13/24 09/18/24 Yes Vonna Sharlet POUR, MD  albuterol  (VENTOLIN  HFA) 108 [phone] Base) MCG/ACT inhaler Inhale 2 puffs into the lungs every 6 (six) hours as needed for wheezing or shortness of breath. 08/27/24   Sofia, Leslie K, [name]  alum & mag hydroxide-simeth (MAALOX  MAX) 400-400-40 MG/5ML suspension Take 10 mLs by mouth every 6 (six) hours as needed for indigestion (20 min prior to meals as needed). 06/25/24   Dreama, Georgia  N, FNP  azelastine  (ASTELIN ) 0.1 % nasal spray Place 2 sprays into both nostrils 2 (two) times daily. Use in each nostril as directed Patient not taking: Reported on 09/12/2024 06/16/24   Alena Morrison, Reagan, MD  Blood Pressure KIT Please use to check blood pressure as needed. 07/15/23   Delores Suzann HERO, MD  budesonide -formoterol  (SYMBICORT ) 160-4.5 MCG/ACT inhaler Inhale 2 puffs into the lungs 2 (two) times daily. Patient not taking: Reported on 09/12/2024 09/02/24   Alena Morrison, Reagan, MD  cetirizine  (ZYRTEC ) 10 MG chewable tablet Chew 10 mg by mouth daily.    [provider]  cetirizine  (ZYRTEC ) 10 MG tablet Take 1 tablet (10 mg  total) by mouth 2 (two) times daily. 08/11/24   Jeneal Danita Macintosh, MD  clonazePAM (KLONOPIN) 1 MG tablet Take 1 mg by mouth daily as needed for anxiety.    [provider]  EPINEPHrine  (AUVI-Q ) 0.3 mg/0.3 mL IJ SOAJ injection Inject 0.3 mg into the muscle as needed for anaphylaxis. 07/08/24   Tharon Lung, MD  esomeprazole  (NEXIUM ) 20 MG capsule Take 1 capsule (20 mg total) by mouth daily. Patient not taking: Reported on 09/12/2024 06/16/24 09/14/24  Alena Morrison, Reagan, MD  etonogestrel  (NEXPLANON ) 68 MG IMPL implant 1 each by Subdermal route once.    [provider]  famotidine  (PEPCID ) 20 MG tablet Take 1 tablet (20 mg total) by mouth 2 (two) times daily. 08/11/24   Jeneal Danita Macintosh, MD  ferrous sulfate  (FEROSUL) 325 (65 FE) MG tablet Take 1 tablet (325 mg total) by mouth every other day. 03/04/24   Jennelle Riis, MD  FLUoxetine  (PROZAC ) 20 MG tablet Take 20 mg by mouth daily.    [provider]  fluticasone  (FLONASE ) 50 MCG/ACT nasal spray Place 2 sprays into both nostrils daily. 04/25/24   Jennelle Riis, MD  fluticasone  (FLOVENT  HFA) 110 MCG/ACT inhaler Inhale 1 puff into the lungs as needed. Patient not taking: Reported on 09/12/2024 10/04/22   [provider]  fluticasone -salmeterol (ADVAIR HFA) 115-21 MCG/ACT inhaler Inhale 2 puffs into the lungs 2 (two) times daily. Patient not taking: Reported on 09/12/2024 04/12/24   Kara Dorn NOVAK, MD  hydrocortisone  2.5 % cream Apply topically 2 (two) times daily. 06/01/24   Enedelia Dorna HERO, FNP  hydroxypropyl methylcellulose / hypromellose (ISOPTO TEARS / GONIOVISC) 2.5 % ophthalmic solution Place 1 drop into both eyes 4 (four) times daily as needed for dry eyes. Patient not taking: Reported on 09/12/2024 10/24/23   Raspet, Rocky K, [name]  hydrOXYzine  (ATARAX ) 10 MG tablet Take 10 mg by mouth. IS ONLY TAKING THIS MEDICINE WITH PANIC ATTACKS 10/04/22   [provider]  montelukast   (SINGULAIR ) 10 MG tablet Take 1 tablet (10 mg total) by mouth at bedtime. Patient not taking: Reported on 09/12/2024 08/11/24   Jeneal Danita Macintosh, MD  polyethylene glycol (MIRALAX  / GLYCOLAX ) 17 g packet Take 17 g by mouth 2 (two) times daily. 06/16/24   Alena Morrison, Reagan, MD  senna (SENOKOT) 8.6 MG TABS tablet Take 1 tablet (8.6 mg total) by mouth 2 (two) times daily. 05/15/23   Macario Dorothyann HERO, MD  triamcinolone  ointment (KENALOG ) 0.1 % Apply 1 Application topically 2 (two) times daily. 07/04/24   Aurea Ethel NOVAK, NP    Family History Family History  Problem Relation Age of Onset  Diabetes Mother    Hypertension Mother    Obesity Mother    Heart attack Mother 24       Death   Allergic rhinitis Sister    Other Sister        per-diabetic   Esophageal cancer Neg Hx    Colon cancer Neg Hx    Liver disease Neg Hx     Social History Social History   Tobacco Use   Smoking status: Former    Types: Cigarettes   Smokeless tobacco: Never  Vaping Use   Vaping status: Never Used  Substance Use Topics   Alcohol use: Not Currently   Drug use: Not Currently    Comment: last used in teens     Allergies   Morphine , Ibuprofen , and Morphine  and codeine    Review of Systems Review of Systems   Physical Exam Triage Vital Signs ED Triage Vitals  Encounter Vitals Group     BP 09/13/24 1215 (!) 88/58     Girls Systolic BP Percentile --      Girls Diastolic BP Percentile --      Boys Systolic BP Percentile --      Boys Diastolic BP Percentile --      Pulse Rate 09/13/24 1215 75     Resp 09/13/24 1215 20     Temp 09/13/24 1215 98.9 F (37.2 C)     Temp Source 09/13/24 1215 Oral     SpO2 09/13/24 1215 96 %     Weight --      Height --      Head Circumference --      Peak Flow --      Pain Score 09/13/24 1211 0     Pain Loc --      Pain Education --      Exclude from Growth Chart --    No data found.  Updated Vital Signs BP 114/75 (BP Location: Left Arm)  Comment (BP Location): large cuff  Pulse 75   Temp 98.9 F (37.2 C) (Oral)   Resp 20   LMP 09/13/2024 (Approximate)   SpO2 96%   Visual Acuity Right Eye Distance:   Left Eye Distance:   Bilateral Distance:    Right Eye Near:   Left Eye Near:    Bilateral Near:     Physical Exam Vitals reviewed.  Constitutional:      General: She is not in acute distress.    Appearance: She is not ill-appearing, toxic-appearing or diaphoretic.  HENT:     Mouth/Throat:     Mouth: Mucous membranes are moist.  Eyes:     Extraocular Movements: Extraocular movements intact.     Conjunctiva/sclera: Conjunctivae normal.     Pupils: Pupils are equal, round, and reactive to light.  Cardiovascular:     Rate and Rhythm: Normal rate and regular rhythm.     Heart sounds: No murmur heard. Pulmonary:     Effort: Pulmonary effort is normal. No respiratory distress.     Breath sounds: Normal breath sounds. No stridor. No wheezing, rhonchi or rales.  Musculoskeletal:     Cervical back: Neck supple.  Lymphadenopathy:     Cervical: No cervical adenopathy.  Skin:    Coloration: Skin is not jaundiced or pale.     Comments: There is a maculopapular rash on her cheeks and nose and on her neck.  Neurological:     General: No focal deficit present.     Mental Status: She is alert and  oriented to person, place, and time.  Psychiatric:        Behavior: Behavior normal.      UC Treatments / Results  Labs (all labs ordered are listed, but only abnormal results are displayed) Labs Reviewed - No data to display  EKG   Radiology No results found.  Procedures Procedures (including critical care time)  Medications Ordered in UC Medications  dexamethasone  (DECADRON ) injection 10 mg (has no administration in time range)    Initial Impression / Assessment and Plan / UC Course  I have reviewed the triage vital signs and the nursing notes.  Pertinent labs & imaging results that were available during  my care of the patient were reviewed by me and considered in my medical decision making (see chart for details).     Decadron  injection is given here and 5-day burst of prednisone  was sent to the pharmacy.  I have asked her to call her allergy specialist for them to review her results over the phone or to call for an appointment to review in person Final Clinical Impressions(s) / UC Diagnoses   Final diagnoses:  Allergic dermatitis     Discharge Instructions      You have been given a shot of dexamethasone  10 mg, steroid.  Take prednisone  20 mg--2 daily for 5 days  Please follow-up with your allergy specialist     ED Prescriptions     Medication Sig Dispense Auth. Provider   predniSONE  (DELTASONE ) 20 MG tablet Take 2 tablets (40 mg total) by mouth daily with breakfast for 5 days. 10 tablet Vonna Yunis Voorheis K, MD      PDMP not reviewed this encounter.   Vonna Sharlet POUR, MD 09/13/24 1256

## 2024-09-13 NOTE — Discharge Instructions (Signed)
 You have been given a shot of dexamethasone  10 mg, steroid.  Take prednisone  20 mg--2 daily for 5 days  Please follow-up with your allergy specialist

## 2024-09-20 ENCOUNTER — Ambulatory Visit: Payer: Self-pay | Admitting: Family Medicine

## 2024-09-21 ENCOUNTER — Ambulatory Visit: Admitting: Family

## 2024-09-22 ENCOUNTER — Encounter: Payer: Self-pay | Admitting: Family Medicine

## 2024-09-22 ENCOUNTER — Other Ambulatory Visit: Payer: Self-pay

## 2024-09-22 ENCOUNTER — Ambulatory Visit: Admitting: Family Medicine

## 2024-09-22 VITALS — BP 102/60 | HR 74 | Temp 98.7°F

## 2024-09-22 DIAGNOSIS — L299 Pruritus, unspecified: Secondary | ICD-10-CM | POA: Diagnosis not present

## 2024-09-22 DIAGNOSIS — H1013 Acute atopic conjunctivitis, bilateral: Secondary | ICD-10-CM | POA: Diagnosis not present

## 2024-09-22 DIAGNOSIS — J302 Other seasonal allergic rhinitis: Secondary | ICD-10-CM | POA: Diagnosis not present

## 2024-09-22 DIAGNOSIS — J3089 Other allergic rhinitis: Secondary | ICD-10-CM

## 2024-09-22 DIAGNOSIS — J4540 Moderate persistent asthma, uncomplicated: Secondary | ICD-10-CM

## 2024-09-22 DIAGNOSIS — L508 Other urticaria: Secondary | ICD-10-CM

## 2024-09-22 DIAGNOSIS — T7800XD Anaphylactic reaction due to unspecified food, subsequent encounter: Secondary | ICD-10-CM | POA: Diagnosis not present

## 2024-09-22 DIAGNOSIS — T7800XA Anaphylactic reaction due to unspecified food, initial encounter: Secondary | ICD-10-CM | POA: Insufficient documentation

## 2024-09-22 DIAGNOSIS — H1010 Acute atopic conjunctivitis, unspecified eye: Secondary | ICD-10-CM | POA: Insufficient documentation

## 2024-09-22 NOTE — Patient Instructions (Addendum)
 Asthma Continue Symbicort  160-2 puffs twice a day with a spacer to prevent cough or wheeze as recommended by your pulmonary specialist Continue albuterol  2 puffs once every 4 hours if needed for cough or wheeze  Allergic rhinitis Continue allergen avoidance measures directed toward grass pollen, weed pollen, tree pollen, indoor mold, outdoor mold, dust mite, cat, dog, and cockroach as listed below Continue cetirizine  if needed for runny nose or itch Consider saline nasal rinses as needed for nasal symptoms. Use this before any medicated nasal sprays for best result Consider allergen immunotherapy if your symptoms are not well-controlled with the treatment plan as listed above  Allergic conjunctivitis Some over the counter eye drops include Pataday one drop in each eye once a day as needed for red, itchy eyes OR Zaditor one drop in each eye twice a day as needed for red itchy eyes. Avoid eye drops that say red eye relief as they may contain medications that dry out your eyes.   Hives (urticaria) Take the least amount of medications while remaining hive free  Cetirizine  (Zyrtec ) 10mg  twice a day and famotidine  (Pepcid ) 20 mg twice a day. If no symptoms for 7-14 days then decrease to. Cetirizine  (Zyrtec ) 10mg  twice a day and famotidine  (Pepcid ) 20 mg once a day.  If no symptoms for 7-14 days then decrease to. Cetirizine  (Zyrtec ) 10mg  twice a day.  If no symptoms for 7-14 days then decrease to. Cetirizine  (Zyrtec ) 10mg  once a day.  May use Benadryl  (diphenhydramine ) as needed for breakthrough hives       If symptoms return, then step up dosage  Keep a detailed symptom journal including foods eaten, contact with allergens, medications taken, weather changes.  Consider Xolair injections for hive control.  Written information provided at today's visit.  Will submit to your insurance for Xolair for hive control.  You will next hear from Tammy Xolair coordinator with next steps.  Food  allergy Continue to avoid oat.  In case of an allergic reaction, take cetirizine  10 mg once or twice every 12-24 hours, and if life-threatening symptoms occur, inject with EpiPen  0.3 mg.  Return to the clinic for skin testing for foods.  Remember to stop cetirizine  and famotidine  for 3 days before the skin testing appointment.  If you are having hives on the day of the skin testing appointment, we will need to reschedule.  Call the clinic if this treatment plan is not working well for you  Follow up in 1 month or sooner if needed.  Reducing Pollen Exposure The American Academy of Allergy, Asthma and Immunology suggests the following steps to reduce your exposure to pollen during allergy seasons. Do not hang sheets or clothing out to dry; pollen may collect on these items. Do not mow lawns or spend time around freshly cut grass; mowing stirs up pollen. Keep windows closed at night.  Keep car windows closed while driving. Minimize morning activities outdoors, a time when pollen counts are usually at their highest. Stay indoors as much as possible when pollen counts or humidity is high and on windy days when pollen tends to remain in the air longer. Use air conditioning when possible.  Many air conditioners have filters that trap the pollen spores. Use a HEPA room air filter to remove pollen form the indoor air you breathe.    Control of Mold Allergen Mold and fungi can grow on a variety of surfaces provided certain temperature and moisture conditions exist.  Outdoor molds grow on plants, decaying vegetation  and soil.  The major outdoor mold, Alternaria and Cladosporium, are found in very high numbers during hot and dry conditions.  Generally, a late Summer - Fall peak is seen for common outdoor fungal spores.  Rain will temporarily lower outdoor mold spore count, but counts rise rapidly when the rainy period ends.  The most important indoor molds are Aspergillus and Penicillium.  Dark, humid and  poorly ventilated basements are ideal sites for mold growth.  The next most common sites of mold growth are the bathroom and the kitchen.  Outdoor [employer] Use air conditioning and keep windows closed Avoid exposure to decaying vegetation. Avoid leaf raking. Avoid grain handling. Consider wearing a face mask if working in moldy areas.  Indoor Mold Control Maintain humidity below 50%. Clean washable surfaces with 5% bleach solution. Remove sources e.g. Contaminated carpets.  Control of Dust Mite Allergen Dust mites play a major role in allergic asthma and rhinitis. They occur in environments with high humidity wherever human skin is found. Dust mites absorb humidity from the atmosphere (ie, they do not drink) and feed on organic matter (including shed human and animal skin). Dust mites are a microscopic type of insect that you cannot see with the naked eye. High levels of dust mites have been detected from mattresses, pillows, carpets, upholstered furniture, bed covers, clothes, soft toys and any woven material. The principal allergen of the dust mite is found in its feces. A gram of dust may contain 1,000 mites and 250,000 fecal particles. Mite antigen is easily measured in the air during house cleaning activities. Dust mites do not bite and do not cause harm to humans, other than by triggering allergies/asthma.  Ways to decrease your exposure to dust mites in your home:  1. Encase mattresses, box springs and pillows with a mite-impermeable barrier or cover 2. Wash sheets, blankets and drapes weekly in hot water  (130 F) with detergent and dry them in a dryer on the hot setting. 3. Have the room cleaned frequently with a vacuum cleaner and a damp dust-mop. For carpeting or rugs, vacuuming with a vacuum cleaner equipped with a high-efficiency particulate air (HEPA) filter. The dust mite allergic individual should not be in a room which is being cleaned and should wait 1 hour after cleaning  before going into the room. 4. Do not sleep on upholstered furniture (eg, couches). 5. If possible removing carpeting, upholstered furniture and drapery from the home is ideal. Horizontal blinds should be eliminated in the rooms where the person spends the most time (bedroom, study, television room). Washable vinyl, roller-type shades are optimal. 6. Remove all non-washable stuffed toys from the bedroom. Wash stuffed toys weekly like sheets and blankets above. 7. Reduce indoor humidity to less than 50%. Inexpensive humidity monitors can be purchased at most hardware stores. Do not use a humidifier as can make the problem worse and are not recommended.  Control of Dog or Cat Allergen Avoidance is the best way to manage a dog or cat allergy. If you have a dog or cat and are allergic to dog or cats, consider removing the dog or cat from the home. If you have a dog or cat but don't want to find it a new home, or if your family wants a pet even though someone in the household is allergic, here are some strategies that may help keep symptoms at bay:  Keep the pet out of your bedroom and restrict it to only a few rooms. Be advised that  keeping the dog or cat in only one room will not limit the allergens to that room. Don't pet, hug or kiss the dog or cat; if you do, wash your hands with soap and water . High-efficiency particulate air (HEPA) cleaners run continuously in a bedroom or living room can reduce allergen levels over time. Regular use of a high-efficiency vacuum cleaner or a central vacuum can reduce allergen levels. Giving your dog or cat a bath at least once a week can reduce airborne allergen.  Control of Cockroach Allergen Cockroach allergen has been identified as an important cause of acute attacks of asthma, especially in urban settings.  There are fifty-five species of cockroach that exist in the [place] , however only three, the [place], [place] and [place] species produce allergen  that can affect patients with Asthma.  Allergens can be obtained from fecal particles, egg casings and secretions from cockroaches.    Remove food sources. Reduce access to water . Seal access and entry points. Spray runways with 0.5-1% Diazinon or Chlorpyrifos Blow boric acid power under stoves and refrigerator. Place bait stations (hydramethylnon) at feeding sites.

## 2024-09-22 NOTE — Progress Notes (Addendum)
 [address] [place] [address] Dept: [phone]  FOLLOW UP NOTE  Patient ID: Veronica [name], female    DOB: Jul 31, 1991  Age: 33 y.o. MRN: [id number] Date of Office Visit: 09/22/2024  Assessment  Chief Complaint: Asthma, Allergy, and Follow-up  HPI Veronica [name] is a 33 year old female who presents to clinic for follow-up visit.  She was last seen in this clinic on 08/11/2024 by Dr. Jeneal for evaluation of asthma, allergic rhinitis, allergic conjunctivitis, pruritus, urticaria, and food allergy to banana and oat.    Her last food allergy testing on 08/11/2024 was positive to banana and dilute.  Workup for urticaria was within normal limits Discussed the use of AI scribe software for clinical note transcription with the patient, who gave verbal consent to proceed.  History of Present Illness Veronica [name] is a 33 year old female who presents with recurrent hives and allergic symptoms.  She has been experiencing recurrent hives and bumps on her face and body for the past four months. Initially, these symptoms were attributed to the use of Tide laundry detergent, which caused itching and bumps. After switching to a sensitive detergent and re-washing her clothes, the symptoms subsided but reappeared two months ago without a clear trigger. The hives occur approximately twice a week and are itchy.  She continues high-dose antihistamines and montelukast  with no relief of hives or itching.  We discussed Xolair injections in detail.  She is interested in further allergy testing and discussed the need to finish all testing prior to starting Xolair.  She has been visiting urgent care frequently due to these breakouts and has been treated with prednisone , both in shot form and as a five-day course. However, she is concerned about the side effects of steroids, including elevated blood sugar levels, which reached 214 mg/dL, and increased anxiety. She did not complete the five-day prednisone   course due to these concerns.  She has a history of anxiety and monitors her blood sugar levels at home. She has not identified any new medications, foods, personal care products, recent illness, insect bites, or environmental changes that could explain the recent onset of symptoms, although she did work in a facility with dust and cats for two weeks, which affected her breathing but not her skin.   She experiences allergy symptoms such as sneezing and nasal stuffiness, and uses a nasal spray and cetirizine  once daily. She also takes famotidine  twice daily for acid reflux, which is well-controlled.  She reports there is a dog in the home, however, the dog does not sleep in the room with her. Her last environmental allergy testing via lab on 08/11/2024 was positive to grass pollen, weed pollen, tree pollen, mold, dust mite, cat, dog, and cockroach.  She has asthma and uses albuterol  twice a week for breathing difficulties. She also uses Symbicort , two puffs twice a day, and recently started montelukast  about a month ago, which she takes daily. She reports occasional wheezing and shortness of breath, particularly related to her acid reflux.  She continues to follow-up with her pulmonology specialist as recommended.  Allergy testing showed low positive results for banana and oat; however, she reports eating bananas regularly without issue. She reports that her itchy and watery eyes improved when she stopped eating bananas for a few days.  She reports that she will stop eating banana and is not in note any improvement in hives or itching.  She reports itchy and watery eyes daily, which improved when she stopped  eating bananas for a few days. She does not use allergy eye drops but expressed interest in them.   Her current medications are listed in the chart.   Drug Allergies:  Allergies  Allergen Reactions   Morphine  Anaphylaxis   Ibuprofen  Nausea And Vomiting    Was told to not take this because it  would flare stomach issues   Morphine  And Codeine  Other (See Comments)    Causes chest to be heavy    Physical Exam: BP 102/60 (BP Location: Left Arm, Patient Position: Sitting, Cuff Size: Large)   Pulse 74   Temp 98.7 F (37.1 C) (Temporal)   LMP 09/13/2024 (Approximate)   SpO2 99%    Physical Exam Vitals reviewed.  Constitutional:      Appearance: Normal appearance.  HENT:     Head: Normocephalic and atraumatic.     Right Ear: Tympanic membrane normal.     Left Ear: Tympanic membrane normal.     Nose:     Comments: Bilateral nares slightly erythematous with thin clear nasal drainage noted.  Pharynx normal.  Ears normal.  Nose normal.    Mouth/Throat:     Pharynx: Oropharynx is clear.  Eyes:     Conjunctiva/sclera: Conjunctivae normal.  Cardiovascular:     Rate and Rhythm: Normal rate and regular rhythm.     Heart sounds: Normal heart sounds. No murmur heard. Pulmonary:     Effort: Pulmonary effort is normal.     Breath sounds: Normal breath sounds.     Comments: Lungs clear to auscultation Musculoskeletal:        General: Normal range of motion.     Cervical back: Normal range of motion and neck supple.  Skin:    General: Skin is warm and dry.     Comments: Scattered flesh-colored raised areas noted across both cheeks.  No open areas or drainage noted.  Neurological:     Mental Status: She is alert and oriented to person, place, and time.  Psychiatric:        Mood and Affect: Mood normal.        Behavior: Behavior normal.        Thought Content: Thought content normal.        Judgment: Judgment normal.     Assessment and Plan: 1. Pruritus   2. Moderate persistent asthma, uncomplicated   3. Seasonal allergic conjunctivitis   4. Seasonal and perennial allergic rhinitis   5. Chronic urticaria   6. Allergy with anaphylaxis due to food     No orders of the defined types were placed in this encounter.   Patient Instructions  Asthma Continue Symbicort   160-2 puffs twice a day with a spacer to prevent cough or wheeze as recommended by your pulmonary specialist Continue albuterol  2 puffs once every 4 hours if needed for cough or wheeze  Allergic rhinitis Continue allergen avoidance measures directed toward grass pollen, weed pollen, tree pollen, indoor mold, outdoor mold, dust mite, cat, dog, and cockroach as listed below Continue cetirizine  if needed for runny nose or itch Consider saline nasal rinses as needed for nasal symptoms. Use this before any medicated nasal sprays for best result Consider allergen immunotherapy if your symptoms are not well-controlled with the treatment plan as listed above  Allergic conjunctivitis Some over the counter eye drops include Pataday one drop in each eye once a day as needed for red, itchy eyes OR Zaditor one drop in each eye twice a day as needed for red itchy  eyes. Avoid eye drops that say red eye relief as they may contain medications that dry out your eyes.   Hives (urticaria) Take the least amount of medications while remaining hive free Cetirizine  (Zyrtec ) 10mg  twice a day and famotidine  (Pepcid ) 20 mg twice a day. If no symptoms for 7-14 days then decrease to. Cetirizine  (Zyrtec ) 10mg  twice a day and famotidine  (Pepcid ) 20 mg once a day.  If no symptoms for 7-14 days then decrease to. Cetirizine  (Zyrtec ) 10mg  twice a day.  If no symptoms for 7-14 days then decrease to. Cetirizine  (Zyrtec ) 10mg  once a day.  May use Benadryl  (diphenhydramine ) as needed for breakthrough hives       If symptoms return, then step up dosage  Keep a detailed symptom journal including foods eaten, contact with allergens, medications taken, weather changes.  Consider Xolair injections for hive control.  Written information provided at today's visit.  Will submit to your insurance for Xolair for hive control.  You will next hear from Tammy Xolair coordinator with next steps.  Food allergy Continue to avoid oat.  In case  of an allergic reaction, take cetirizine  10 mg once or twice every 12-24 hours, and if life-threatening symptoms occur, inject with EpiPen  0.3 mg.  Return to the clinic for skin testing for foods.  Remember to stop cetirizine  and famotidine  for 3 days before the skin testing appointment.  If you are having hives on the day of the skin testing appointment, we will need to reschedule.  Call the clinic if this treatment plan is not working well for you  Follow up in 1 month or sooner if needed.   Return in about 4 weeks (around 10/20/2024), or if symptoms worsen or fail to improve.    Thank you for the opportunity to care for this patient.  Please do not hesitate to contact me with questions.  Arlean Mutter, FNP [facility] 

## 2024-10-04 ENCOUNTER — Telehealth: Payer: Self-pay | Admitting: *Deleted

## 2024-10-04 DIAGNOSIS — F410 Panic disorder [episodic paroxysmal anxiety] without agoraphobia: Secondary | ICD-10-CM | POA: Diagnosis not present

## 2024-10-04 NOTE — Telephone Encounter (Signed)
 L./m for patient to contact me to advise Xolair approval and submit to [facility]

## 2024-10-04 NOTE — Telephone Encounter (Signed)
-----   Message from Arlean Mutter sent at 09/22/2024  1:19 PM EDT ----- Patsy there Juandavid Dallman, can you please submit for Xolair for this patient for chronic urticaria that began about 4 months ago?  Thank you

## 2024-10-10 ENCOUNTER — Ambulatory Visit (HOSPITAL_COMMUNITY)
Admission: EM | Admit: 2024-10-10 | Discharge: 2024-10-10 | Disposition: A | Discharge status: Home / Self Care | Attending: Family Medicine | Admitting: Family Medicine

## 2024-10-10 ENCOUNTER — Encounter (HOSPITAL_COMMUNITY): Payer: Self-pay | Admitting: Emergency Medicine

## 2024-10-10 DIAGNOSIS — J309 Allergic rhinitis, unspecified: Secondary | ICD-10-CM

## 2024-10-10 DIAGNOSIS — H1013 Acute atopic conjunctivitis, bilateral: Secondary | ICD-10-CM

## 2024-10-10 MED ORDER — LORATADINE 10 MG PO TABS: 10.0000 mg | ORAL_TABLET | Freq: Two times a day (BID) | ORAL | 0 refills | Status: AC

## 2024-10-10 MED ORDER — AZELASTINE HCL 0.1 % NA SOLN
1.0000 | Freq: Two times a day (BID) | NASAL | 0 refills | Status: AC
Start: 2024-10-10 — End: ?

## 2024-10-10 MED ORDER — AZELASTINE HCL 0.05 % OP SOLN: 1.0000 [drp] | Freq: Two times a day (BID) | OPHTHALMIC | 0 refills | Status: AC

## 2024-10-10 NOTE — Discharge Instructions (Addendum)
 Start azelastine  antihistamine eyedrops as prescribed.  You may also use azelastine  nasal spray twice daily.  Stop your cetirizine  and do trial of loratadine  twice daily.  Follow-up with your allergist at your scheduled appointment this month.  Hope you feel better soon!

## 2024-10-10 NOTE — ED Triage Notes (Signed)
 Patient c/o bilateral eye irritation, feels like they are burning and watery x 1 week, worse last night.  Patient does have nasal drainage, bilateral ear pain.  Patient denies any OTC pain & cold meds.

## 2024-10-10 NOTE — ED Provider Notes (Signed)
 [facility]    CSN: [id number] Arrival date & time: 10/10/24  1523      History   Chief Complaint Chief Complaint  Patient presents with   Eye Problem    HPI Veronica [name] is a 33 y.o. female presents for eye allergies.  Patient reports an extensive history of environmental allergies and has recently started to see an allergist.  She states over the past week she has had worsening bilateral eye redness, burning/watery/itchy.  Also endorses stuffy nose/runny nose as well.  She has been taking cetirizine  twice daily for many many years she states.  She says Flonase  daily as well.  She has not used any OTC eyedrops for symptoms.  She is an appoint with her allergist at the end of the month.  Denies cough.  No other concerns at this time.   Eye Problem Associated symptoms: discharge, itching and redness     Past Medical History:  Diagnosis Date   Abdominal pain 01/06/2012   Acute tension headache 07/27/2019   Anemia    Ankle pain    Anxiety    Asthma    Birth control counseling 08/26/2011   BMI 40.0-44.9, adult (HCC) 02/27/2011   Dysuria 01/13/2013   Epigastric pain 09/13/2019   GERD (gastroesophageal reflux disease)    Heart murmur    Hiatal hernia    Obesity (BMI 30-39.9)    Pre-eclampsia    off med since 4/23   Secondary amenorrhea 03/25/2018   Trichomonas infection 06/17/2022    Patient Active Problem List   Diagnosis Date Noted   Allergy with anaphylaxis due to food 09/22/2024   Chronic urticaria 09/22/2024   Seasonal and perennial allergic rhinitis 09/22/2024   Seasonal allergic conjunctivitis 09/22/2024   Moderate persistent asthma, uncomplicated 09/22/2024   Perioral hyperpigmentation 06/16/2024   Pruritus 06/03/2024   Ear itching 04/25/2024   Paresthesia 04/25/2024   Light headedness 03/31/2024   Panic attacks 02/13/2024   Dizziness 01/08/2024   Constipation 05/17/2023   Nocturnal oxygen desaturation 03/23/2022   Allergic rhinitis  03/07/2022   Asthma 08/31/2020   Back pain 02/13/2020   Hiatal hernia 11/02/2019   Subclinical hypothyroidism 05/19/2019   GAD (generalized anxiety disorder) 05/09/2019   Nonintractable episodic headache 11/15/2018   Seasonal allergies 03/25/2018   Chronic cough 10/16/2017   Fatigue 05/09/2016   GERD (gastroesophageal reflux disease) 03/28/2011   BMI 40.0-44.9, adult (HCC) 02/27/2011    Past Surgical History:  Procedure Laterality Date   ADENOIDECTOMY     CESAREAN SECTION  09/02/2010   For macrosomia, but son was 7 lb 12 oz   CESAREAN SECTION N/A 10/06/2021   Procedure: CESAREAN SECTION;  Surgeon: Izell Harari, MD;  Location: [facility];  Service: Obstetrics;  Laterality: N/A;   CHOLECYSTECTOMY N/A 06/16/2022   Procedure: LAPAROSCOPIC  CHOLECYSTECTOMY;  Surgeon: Signe Mitzie LABOR, MD;  Location: [facility];  Service: General;  Laterality: N/A;   TONSILLECTOMY AND ADENOIDECTOMY      OB History     Gravida  2   Para  2   Term  2   Preterm      AB      Living  2      SAB      IAB      Ectopic      Multiple  0   Live Births  2            Home Medications    Prior to Admission medications  Medication Sig Start Date End Date Taking? Authorizing Provider  albuterol  (VENTOLIN  HFA) 108 (90 Base) MCG/ACT inhaler Inhale 2 puffs into the lungs every 6 (six) hours as needed for wheezing or shortness of breath. 08/27/24  Yes Sofia, Leslie K, [name]  alum & mag hydroxide-simeth (MAALOX MAX) 400-400-40 MG/5ML suspension Take 10 mLs by mouth every 6 (six) hours as needed for indigestion (20 min prior to meals as needed). 06/25/24  Yes Garrison, [place]  N, FNP  azelastine  (ASTELIN ) 0.1 % nasal spray Place 1 spray into both nostrils 2 (two) times daily. Use in each nostril as directed 10/10/24  Yes Ariaunna Longsworth, Jodi R, NP  azelastine  (OPTIVAR ) 0.05 % ophthalmic solution Place 1 drop into both eyes 2 (two) times daily. 10/10/24  Yes Loreda Myla SAUNDERS, NP  Blood Pressure KIT Please use  to check blood pressure as needed. 07/15/23  Yes Delores Suzann HERO, MD  budesonide -formoterol  (SYMBICORT ) 160-4.5 MCG/ACT inhaler Inhale 2 puffs into the lungs 2 (two) times daily. 09/02/24  Yes Alena Morrison, Reagan, MD  clonazePAM (KLONOPIN) 1 MG tablet Take 1 mg by mouth daily as needed for anxiety.   Yes [provider]  EPINEPHrine  (AUVI-Q ) 0.3 mg/0.3 mL IJ SOAJ injection Inject 0.3 mg into the muscle as needed for anaphylaxis. 07/08/24  Yes Mabe, Elna, MD  etonogestrel  (NEXPLANON ) 68 MG IMPL implant 1 each by Subdermal route once.   Yes [provider]  famotidine  (PEPCID ) 20 MG tablet Take 1 tablet (20 mg total) by mouth 2 (two) times daily. 08/11/24  Yes Padgett, Danita Macintosh, MD  ferrous sulfate  (FEROSUL) 325 (65 FE) MG tablet Take 1 tablet (325 mg total) by mouth every other day. 03/04/24  Yes Sowell, Penne, MD  FLUoxetine  (PROZAC ) 20 MG capsule Take 20 mg by mouth daily. 07/23/24  Yes [provider]  FLUoxetine  (PROZAC ) 20 MG tablet Take 20 mg by mouth daily.   Yes [provider]  hydrocortisone  2.5 % cream Apply topically 2 (two) times daily. 06/01/24  Yes Enedelia Dorna HERO, FNP  loratadine  (CLARITIN ) 10 MG tablet Take 1 tablet (10 mg total) by mouth 2 (two) times daily. 10/10/24  Yes Yasamin Karel, Jodi R, NP  montelukast  (SINGULAIR ) 10 MG tablet Take 1 tablet (10 mg total) by mouth at bedtime. 08/11/24  Yes Padgett, Danita Macintosh, MD  senna (SENOKOT) 8.6 MG TABS tablet Take 1 tablet (8.6 mg total) by mouth 2 (two) times daily. 05/15/23  Yes Macario Dorothyann HERO, MD  triamcinolone  ointment (KENALOG ) 0.1 % Apply 1 Application topically 2 (two) times daily. 07/04/24  Yes Reddick, Johnathan B, NP  esomeprazole  (NEXIUM ) 20 MG capsule Take 1 capsule (20 mg total) by mouth daily. Patient not taking: Reported on 09/12/2024 06/16/24 09/14/24  Alena Morrison, Reagan, MD  fluticasone  (FLONASE ) 50 MCG/ACT nasal spray Place 2 sprays into both nostrils daily. 04/25/24    Jennelle Penne, MD  fluticasone  (FLOVENT  HFA) 110 MCG/ACT inhaler Inhale 1 puff into the lungs as needed. Patient not taking: Reported on 09/22/2024 10/04/22   [provider]  fluticasone -salmeterol (ADVAIR HFA) 115-21 MCG/ACT inhaler Inhale 2 puffs into the lungs 2 (two) times daily. Patient not taking: Reported on 09/22/2024 04/12/24   Kara Dorn NOVAK, MD  hydroxypropyl methylcellulose / hypromellose (ISOPTO TEARS / GONIOVISC) 2.5 % ophthalmic solution Place 1 drop into both eyes 4 (four) times daily as needed for dry eyes. Patient not taking: Reported on 09/22/2024 10/24/23   Raspet, Erin K, [name]  polyethylene glycol (MIRALAX  / GLYCOLAX ) 17 g packet Take  17 g by mouth 2 (two) times daily. Patient taking differently: Take 17 g by mouth as needed. 06/16/24   Alena Napoleon Corrente, MD    Family History Family History  Problem Relation Age of Onset   Diabetes Mother    Hypertension Mother    Obesity Mother    Heart attack Mother 59       Death   Allergic rhinitis Sister    Other Sister        per-diabetic   Esophageal cancer Neg Hx    Colon cancer Neg Hx    Liver disease Neg Hx     Social History Social History   Tobacco Use   Smoking status: Former    Types: Cigarettes   Smokeless tobacco: Never  Vaping Use   Vaping status: Never Used  Substance Use Topics   Alcohol use: Not Currently   Drug use: Not Currently    Comment: last used in teens     Allergies   Morphine , Ibuprofen , and Morphine  and codeine    Review of Systems Review of Systems  HENT:  Positive for rhinorrhea.   Eyes:  Positive for discharge, redness and itching.     Physical Exam Triage Vital Signs ED Triage Vitals  Encounter Vitals Group     BP 10/10/24 1703 117/76     Girls Systolic BP Percentile --      Girls Diastolic BP Percentile --      Boys Systolic BP Percentile --      Boys Diastolic BP Percentile --      Pulse Rate 10/10/24 1703 74     Resp 10/10/24 1703 18      Temp 10/10/24 1703 99.4 F (37.4 C)     Temp Source 10/10/24 1703 Oral     SpO2 10/10/24 1703 100 %     Weight 10/10/24 1702 231 lb (104.8 kg)     Height 10/10/24 1702 5' 2 (1.575 m)     Head Circumference --      Peak Flow --      Pain Score 10/10/24 1701 7     Pain Loc --      Pain Education --      Exclude from Growth Chart --    No data found.  Updated Vital Signs BP 117/76 (BP Location: Right Arm)   Pulse 74   Temp 99.4 F (37.4 C) (Oral)   Resp 18   Ht 5' 2 (1.575 m)   Wt 231 lb (104.8 kg)   LMP 09/13/2024 (Approximate)   SpO2 100%   BMI 42.25 kg/m   Visual Acuity Right Eye Distance:   Left Eye Distance:   Bilateral Distance:    Right Eye Near:   Left Eye Near:    Bilateral Near:     Physical Exam Vitals and nursing note reviewed.  Constitutional:      General: She is not in acute distress.    Appearance: Normal appearance. She is not ill-appearing.  HENT:     Head: Normocephalic and atraumatic.  Eyes:     General: Lids are normal.        Right eye: No foreign body, discharge or hordeolum.        Left eye: No foreign body, discharge or hordeolum.     Extraocular Movements: Extraocular movements intact.     Conjunctiva/sclera:     Right eye: Right conjunctiva is injected. No chemosis, exudate or hemorrhage.    Left eye: Left conjunctiva is injected. No  chemosis, exudate or hemorrhage.    Pupils: Pupils are equal, round, and reactive to light.     Comments: Watery drainage noted bilaterally  Cardiovascular:     Rate and Rhythm: Normal rate.  Pulmonary:     Effort: Pulmonary effort is normal.  Skin:    General: Skin is warm and dry.  Neurological:     General: No focal deficit present.     Mental Status: She is alert and oriented to person, place, and time.  Psychiatric:        Mood and Affect: Mood normal.        Behavior: Behavior normal.      UC Treatments / Results  Labs (all labs ordered are listed, but only abnormal results are  displayed) Labs Reviewed - No data to display  EKG   Radiology No results found.  Procedures Procedures (including critical care time)  Medications Ordered in UC Medications - No data to display  Initial Impression / Assessment and Plan / UC Course  I have reviewed the triage vital signs and the nursing notes.  Pertinent labs & imaging results that were available during my care of the patient were reviewed by me and considered in my medical decision making (see chart for details).     Reviewed exam and symptoms with patient.  Will do trial of antihistamine eyedrops as prescribed.  Antihistamine nasal spray daily as needed.  She has been on cetirizine  for several years advised to stop this and do a trial of loratadine  to see if she responds better.  She will follow-up with her allergist at the end of the month at her scheduled appointment.  ER precautions reviewed. Final Clinical Impressions(s) / UC Diagnoses   Final diagnoses:  Allergic conjunctivitis of both eyes  Allergic rhinitis, unspecified seasonality, unspecified trigger     Discharge Instructions      Start azelastine  antihistamine eyedrops as prescribed.  You may also use azelastine  nasal spray twice daily.  Stop your cetirizine  and do trial of loratadine  twice daily.  Follow-up with your allergist at your scheduled appointment this month.  Hope you feel better soon!    ED Prescriptions     Medication Sig Dispense Auth. Provider   azelastine  (OPTIVAR ) 0.05 % ophthalmic solution Place 1 drop into both eyes 2 (two) times daily. 6 mL Daphnee Preiss, Jodi R, NP   loratadine  (CLARITIN ) 10 MG tablet Take 1 tablet (10 mg total) by mouth 2 (two) times daily. 60 tablet Pola Furno, Jodi R, NP   azelastine  (ASTELIN ) 0.1 % nasal spray Place 1 spray into both nostrils 2 (two) times daily. Use in each nostril as directed 30 mL Braylen Staller, Jodi R, NP      PDMP not reviewed this encounter.   Loreda Myla SAUNDERS, NP 10/10/24 [phone]

## 2024-10-11 MED ORDER — XOLAIR 300 MG/2ML ~~LOC~~ SOSY
300.0000 mg | PREFILLED_SYRINGE | SUBCUTANEOUS | 11 refills | Status: DC
  Filled 2024-10-19: qty 2, 28d supply, fill #0
  Filled 2024-12-07: qty 2, 28d supply, fill #1

## 2024-10-11 NOTE — Telephone Encounter (Signed)
 Called patient and advised submit to [facility] and I will reach out once delivery set to make appt to start therapy with one hour wait

## 2024-10-12 ENCOUNTER — Other Ambulatory Visit (HOSPITAL_COMMUNITY): Payer: Self-pay

## 2024-10-12 ENCOUNTER — Other Ambulatory Visit: Payer: Self-pay

## 2024-10-14 ENCOUNTER — Other Ambulatory Visit: Payer: Self-pay

## 2024-10-14 ENCOUNTER — Other Ambulatory Visit (HOSPITAL_COMMUNITY): Payer: Self-pay

## 2024-10-14 NOTE — Progress Notes (Signed)
 Specialty Pharmacy Initiation Note   Veronica [name] is a 33 y.o. female who will be followed by the specialty pharmacy service for RxSp Allergy    Review of administration, indication, effectiveness, safety, potential side effects, storage/disposable, and missed dose instructions occurred today for patient's specialty medication(s) Omalizumab (Xolair)     Patient/Caregiver did not have any additional questions or concerns.   Patient's therapy is appropriate to: Initiate    Goals Addressed             This Visit's Progress    Reduce signs and symptoms       Patient is initiating therapy. Patient will maintain adherence         Delon CHRISTELLA Brow Specialty Pharmacist

## 2024-10-14 NOTE — Progress Notes (Signed)
 Patient will call back 1pm for a CC card.

## 2024-10-19 ENCOUNTER — Other Ambulatory Visit: Payer: Self-pay

## 2024-10-19 ENCOUNTER — Other Ambulatory Visit (HOSPITAL_COMMUNITY): Payer: Self-pay

## 2024-10-19 NOTE — Progress Notes (Signed)
 Specialty Pharmacy Initial Fill Coordination Note  Veronica [name] is a 33 y.o. female contacted today regarding initial fill of specialty medication(s) Omalizumab (Xolair)   Patient requested Courier to Provider Office   Delivery date: 10/25/24   Verified address: [address] [place], [place] [address]   Medication will be filled on: 10/24/24   Patient is aware of $4.00 copayment.

## 2024-10-21 ENCOUNTER — Ambulatory Visit: Admitting: Gastroenterology

## 2024-10-24 ENCOUNTER — Other Ambulatory Visit (HOSPITAL_COMMUNITY): Payer: Self-pay

## 2024-10-24 ENCOUNTER — Other Ambulatory Visit: Payer: Self-pay

## 2024-10-25 ENCOUNTER — Other Ambulatory Visit (HOSPITAL_COMMUNITY): Payer: Self-pay

## 2024-10-25 ENCOUNTER — Other Ambulatory Visit: Payer: Self-pay

## 2024-10-26 ENCOUNTER — Telehealth: Payer: Self-pay

## 2024-10-26 ENCOUNTER — Other Ambulatory Visit: Payer: Self-pay

## 2024-10-27 ENCOUNTER — Ambulatory Visit (INDEPENDENT_AMBULATORY_CARE_PROVIDER_SITE_OTHER): Admitting: Family Medicine

## 2024-10-27 ENCOUNTER — Encounter: Payer: Self-pay | Admitting: Family Medicine

## 2024-10-27 DIAGNOSIS — T7800XD Anaphylactic reaction due to unspecified food, subsequent encounter: Secondary | ICD-10-CM | POA: Diagnosis not present

## 2024-10-27 DIAGNOSIS — H1010 Acute atopic conjunctivitis, unspecified eye: Secondary | ICD-10-CM

## 2024-10-27 DIAGNOSIS — J302 Other seasonal allergic rhinitis: Secondary | ICD-10-CM

## 2024-10-27 DIAGNOSIS — T7800XA Anaphylactic reaction due to unspecified food, initial encounter: Secondary | ICD-10-CM

## 2024-10-27 DIAGNOSIS — J4540 Moderate persistent asthma, uncomplicated: Secondary | ICD-10-CM

## 2024-10-27 DIAGNOSIS — L299 Pruritus, unspecified: Secondary | ICD-10-CM

## 2024-10-27 DIAGNOSIS — H1013 Acute atopic conjunctivitis, bilateral: Secondary | ICD-10-CM

## 2024-10-27 DIAGNOSIS — J3089 Other allergic rhinitis: Secondary | ICD-10-CM

## 2024-10-27 DIAGNOSIS — L508 Other urticaria: Secondary | ICD-10-CM

## 2024-10-27 NOTE — Patient Instructions (Addendum)
 Asthma Continue Symbicort  160-2 puffs twice a day with a spacer to prevent cough or wheeze as recommended by your pulmonary specialist Continue albuterol  2 puffs once every 4 hours if needed for cough or wheeze  Allergic rhinitis Continue allergen avoidance measures directed toward grass pollen, weed pollen, tree pollen, indoor mold, outdoor mold, dust mite, cat, dog, and cockroach as listed below Continue cetirizine  if needed for runny nose or itch Consider saline nasal rinses as needed for nasal symptoms. Use this before any medicated nasal sprays for best result Consider allergen immunotherapy if your symptoms are not well-controlled with the treatment plan as listed above  Allergic conjunctivitis Some over the counter eye drops include Pataday one drop in each eye once a day as needed for red, itchy eyes OR Zaditor one drop in each eye twice a day as needed for red itchy eyes. Avoid eye drops that say red eye relief as they may contain medications that dry out your eyes.   Hives (urticaria) Take the least amount of medications while remaining hive free  Cetirizine  (Zyrtec ) 10mg  twice a day and famotidine  (Pepcid ) 20 mg twice a day. If no symptoms for 7-14 days then decrease to. Cetirizine  (Zyrtec ) 10mg  twice a day and famotidine  (Pepcid ) 20 mg once a day.  If no symptoms for 7-14 days then decrease to. Cetirizine  (Zyrtec ) 10mg  twice a day.  If no symptoms for 7-14 days then decrease to. Cetirizine  (Zyrtec ) 10mg  once a day.  May use Benadryl  (diphenhydramine ) as needed for breakthrough hives       If symptoms return, then step up dosage  Keep a detailed symptom journal including foods eaten, contact with allergens, medications taken, weather changes.  Consider Xolair injections for hive control.  Written information provided at today's visit.  Will submit to your insurance for Xolair for hive control.  You will next hear from Veronica [name] Xolair coordinator with next steps.  Food  allergy Continue to avoid oat banana, and almond.  In case of an allergic reaction, take cetirizine  10 mg once or twice every 12-24 hours, and if life-threatening symptoms occur, inject with EpiPen  0.3 mg.  Your skin testing was negative to the food panel at today's visit.  Continue to avoid banana, oat, and almond.  Make a food challenge appointment for banana or oat if interested.  Call me with the type of almond milk that caused shortness of breath.  Call the clinic if this treatment plan is not working well for you  Follow up in 1 month or sooner if needed.  I am popping back in there is a light  Reducing Pollen Exposure The American Academy of Allergy, Asthma and Immunology suggests the following steps to reduce your exposure to pollen during allergy seasons. Do not hang sheets or clothing out to dry; pollen may collect on these items. Do not mow lawns or spend time around freshly cut grass; mowing stirs up pollen. Keep windows closed at night.  Keep car windows closed while driving. Minimize morning activities outdoors, a time when pollen counts are usually at their highest. Stay indoors as much as possible when pollen counts or humidity is high and on windy days when pollen tends to remain in the air longer. Use air conditioning when possible.  Many air conditioners have filters that trap the pollen spores. Use a HEPA room air filter to remove pollen form the indoor air you breathe.    Control of Mold Allergen Mold and fungi can grow on a variety of  surfaces provided certain temperature and moisture conditions exist.  Outdoor molds grow on plants, decaying vegetation and soil.  The major outdoor mold, Alternaria and Cladosporium, are found in very high numbers during hot and dry conditions.  Generally, a late Summer - Fall peak is seen for common outdoor fungal spores.  Rain will temporarily lower outdoor mold spore count, but counts rise rapidly when the rainy period ends.  The most  important indoor molds are Aspergillus and Penicillium.  Dark, humid and poorly ventilated basements are ideal sites for mold growth.  The next most common sites of mold growth are the bathroom and the kitchen.  Outdoor [employer] Use air conditioning and keep windows closed Avoid exposure to decaying vegetation. Avoid leaf raking. Avoid grain handling. Consider wearing a face mask if working in moldy areas.  Indoor Mold Control Maintain humidity below 50%. Clean washable surfaces with 5% bleach solution. Remove sources e.g. Contaminated carpets.  Control of Dust Mite Allergen Dust mites play a major role in allergic asthma and rhinitis. They occur in environments with high humidity wherever human skin is found. Dust mites absorb humidity from the atmosphere (ie, they do not drink) and feed on organic matter (including shed human and animal skin). Dust mites are a microscopic type of insect that you cannot see with the naked eye. High levels of dust mites have been detected from mattresses, pillows, carpets, upholstered furniture, bed covers, clothes, soft toys and any woven material. The principal allergen of the dust mite is found in its feces. A gram of dust may contain 1,000 mites and 250,000 fecal particles. Mite antigen is easily measured in the air during house cleaning activities. Dust mites do not bite and do not cause harm to humans, other than by triggering allergies/asthma.  Ways to decrease your exposure to dust mites in your home:  1. Encase mattresses, box springs and pillows with a mite-impermeable barrier or cover 2. Wash sheets, blankets and drapes weekly in hot water  (130 F) with detergent and dry them in a dryer on the hot setting. 3. Have the room cleaned frequently with a vacuum cleaner and a damp dust-mop. For carpeting or rugs, vacuuming with a vacuum cleaner equipped with a high-efficiency particulate air (HEPA) filter. The dust mite allergic individual should not be  in a room which is being cleaned and should wait 1 hour after cleaning before going into the room. 4. Do not sleep on upholstered furniture (eg, couches). 5. If possible removing carpeting, upholstered furniture and drapery from the home is ideal. Horizontal blinds should be eliminated in the rooms where the person spends the most time (bedroom, study, television room). Washable vinyl, roller-type shades are optimal. 6. Remove all non-washable stuffed toys from the bedroom. Wash stuffed toys weekly like sheets and blankets above. 7. Reduce indoor humidity to less than 50%. Inexpensive humidity monitors can be purchased at most hardware stores. Do not use a humidifier as can make the problem worse and are not recommended.  Control of Dog or Cat Allergen Avoidance is the best way to manage a dog or cat allergy . If you have a dog or cat and are allergic to dog or cats, consider removing the dog or cat from the home. If you have a dog or cat but don't want to find it a new home, or if your family wants a pet even though someone in the household is allergic, here are some strategies that may help keep symptoms at bay:  Keep the  pet out of your bedroom and restrict it to only a few rooms. Be advised that keeping the dog or cat in only one room will not limit the allergens to that room. Don't pet, hug or kiss the dog or cat; if you do, wash your hands with soap and water . High-efficiency particulate air (HEPA) cleaners run continuously in a bedroom or living room can reduce allergen levels over time. Regular use of a high-efficiency vacuum cleaner or a central vacuum can reduce allergen levels. Giving your dog or cat a bath at least once a week can reduce airborne allergen.  Control of Cockroach Allergen Cockroach allergen has been identified as an important cause of acute attacks of asthma, especially in urban settings.  There are fifty-five species of cockroach that exist in the [place] , however  only three, the American, German and Oriental species produce allergen that can affect patients with Asthma.  Allergens can be obtained from fecal particles, egg casings and secretions from cockroaches.    Remove food sources. Reduce access to water . Seal access and entry points. Spray runways with 0.5-1% Diazinon or Chlorpyrifos Blow boric acid power under stoves and refrigerator. Place bait stations (hydramethylnon) at feeding sites.

## 2024-10-27 NOTE — Progress Notes (Signed)
 [address] [place] [address] Dept: [phone]  FOLLOW UP NOTE  Patient ID: Veronica [name], female    DOB: 27-Jul-1991  Age: 33 y.o. MRN: [id number] Date of Office Visit: 10/27/2024  Assessment  Chief Complaint: Allergy  Testing  HPI Veronica [name] is a 33 year old female who presents to the clinic for follow-up visit.  She was last seen in this clinic on 09/22/2024 by Arlean Mutter, FNP week, for evaluation of hives, pruritus, asthma, allergic rhinitis, allergic conjunctivitis, and allergy  to food including oat and banana.  Of note, Xolair  has recently been approved for hive control.  She is feeling well at today's visit with no cardiopulmonary, gastrointestinal, or integumentary symptoms.  She has not had any antihistamines over the last 3 days.  Of note, after the visit, during the wrap-up, she reports that she experiences shortness of breath with milk.  Upon further examination she reports that the product causing shortness of breath was almond milk.  She reports that she is able to consume cows milk in the form of ice cream and cheese with no adverse reaction.  AVS has been revised to avoid almonds as well as oat and banana.  Patient made aware to avoid almond, oat, and banana.  Patient aware that any food challenges need to occur before beginning Xolair  injections for hives.  Her current medications are listed in the chart.  Drug Allergies:  Allergies  Allergen Reactions   Morphine  Anaphylaxis   Ibuprofen  Nausea And Vomiting    Was told to not take this because it would flare stomach issues   Morphine  And Codeine  Other (See Comments)    Causes chest to be heavy     Diagnostics: Food allergy  skin testing negative to the full adult food panel with adequate controls  Assessment and Plan: 1. Moderate persistent asthma, uncomplicated   2. Seasonal allergic conjunctivitis   3. Seasonal and perennial allergic rhinitis   4. Chronic urticaria   5. Pruritus   6. Allergy   with anaphylaxis due to food     No orders of the defined types were placed in this encounter.   Patient Instructions  Asthma Continue Symbicort  160-2 puffs twice a day with a spacer to prevent cough or wheeze as recommended by your pulmonary specialist Continue albuterol  2 puffs once every 4 hours if needed for cough or wheeze  Allergic rhinitis Continue allergen avoidance measures directed toward grass pollen, weed pollen, tree pollen, indoor mold, outdoor mold, dust mite, cat, dog, and cockroach as listed below Continue cetirizine  if needed for runny nose or itch Consider saline nasal rinses as needed for nasal symptoms. Use this before any medicated nasal sprays for best result Consider allergen immunotherapy if your symptoms are not well-controlled with the treatment plan as listed above  Allergic conjunctivitis Some over the counter eye drops include Pataday one drop in each eye once a day as needed for red, itchy eyes OR Zaditor one drop in each eye twice a day as needed for red itchy eyes. Avoid eye drops that say red eye relief as they may contain medications that dry out your eyes.   Hives (urticaria) Take the least amount of medications while remaining hive free  Cetirizine  (Zyrtec ) 10mg  twice a day and famotidine  (Pepcid ) 20 mg twice a day. If no symptoms for 7-14 days then decrease to. Cetirizine  (Zyrtec ) 10mg  twice a day and famotidine  (Pepcid ) 20 mg once a day.  If no symptoms for 7-14 days then decrease to. Cetirizine  (Zyrtec )  10mg  twice a day.  If no symptoms for 7-14 days then decrease to. Cetirizine  (Zyrtec ) 10mg  once a day.  May use Benadryl  (diphenhydramine ) as needed for breakthrough hives       If symptoms return, then step up dosage  Keep a detailed symptom journal including foods eaten, contact with allergens, medications taken, weather changes.  Consider Xolair injections for hive control.  Written information provided at today's visit.  Will submit to your  insurance for Xolair for hive control.  You will next hear from Tammy Xolair coordinator with next steps.  Food allergy Continue to avoid oat banana, and almond.  In case of an allergic reaction, take cetirizine  10 mg once or twice every 12-24 hours, and if life-threatening symptoms occur, inject with EpiPen  0.3 mg.  Your skin testing was negative to the food panel at today's visit.  Continue to avoid banana, oat, and almond.  Make a food challenge appointment for banana or oat if interested.  Call me with the type of almond milk that caused shortness of breath.  Call the clinic if this treatment plan is not working well for you  Follow up in 1 month or sooner if needed.  I am popping back in there is a light  Return in about 4 weeks (around 11/24/2024), or if symptoms worsen or fail to improve.    Thank you for the opportunity to care for this patient.  Please do not hesitate to contact me with questions.  Arlean Mutter, FNP [facility] 

## 2024-10-27 NOTE — Telephone Encounter (Signed)
 Let Specialty team know that office has med, will be rung out in system!

## 2024-11-01 ENCOUNTER — Telehealth: Payer: Self-pay | Admitting: *Deleted

## 2024-11-01 DIAGNOSIS — F411 Generalized anxiety disorder: Secondary | ICD-10-CM

## 2024-11-01 NOTE — Progress Notes (Signed)
 Complex Care Management Note Care Guide Note  11/01/2024 Name: Veronica [name] MRN: [id number] DOB: 1991/07/30   Complex Care Management Outreach Attempts: An unsuccessful telephone outreach was attempted today to offer the patient information about available complex care management services.  Follow Up Plan:  Additional outreach attempts will be made to offer the patient complex care management information and services.   Encounter Outcome:  No Answer  Thedford Franks, CMA [place]  [facility], [facility] Guide Direct Dial : [phone]  Fax: [phone] Website: [email or web address]

## 2024-11-02 NOTE — Progress Notes (Signed)
 Complex Care Management Note Care Guide Note  11/02/2024 Name: Veronica [name] [place] MRN: [id number] DOB: [birth year]   Complex Care Management Outreach Attempts: A second unsuccessful outreach was attempted today to offer the patient with information about available complex care management services.  Follow Up Plan:  Additional outreach attempts will be made to offer the patient complex care management information and services.   Encounter Outcome:  No Answer  Thedford Franks, CMA [place]  [facility], [facility] Guide Direct Dial : [phone]  Fax: [phone] Website: [email or web address]

## 2024-11-07 NOTE — Progress Notes (Signed)
 Complex Care Management Note Care Guide Note  11/07/2024 Name: Veronica [name] MRN: [id number] DOB: [birth year]   Complex Care Management Outreach Attempts: A third unsuccessful outreach was attempted today to offer the patient with information about available complex care management services.  Follow Up Plan:  No further outreach attempts will be made at this time. We have been unable to contact the patient to offer or enroll patient in complex care management services.  Encounter Outcome:  No Answer  Thedford Franks, CMA [place]  [facility], [facility] Guide Direct Dial : [phone]  Fax: [phone] Website: [email or web address]

## 2024-11-10 ENCOUNTER — Ambulatory Visit: Admitting: Gastroenterology

## 2024-11-10 ENCOUNTER — Ambulatory Visit: Attending: Cardiology | Admitting: Cardiology

## 2024-11-10 ENCOUNTER — Other Ambulatory Visit: Payer: Self-pay

## 2024-11-10 ENCOUNTER — Encounter: Payer: Self-pay | Admitting: Cardiology

## 2024-11-10 ENCOUNTER — Encounter: Payer: Self-pay | Admitting: Gastroenterology

## 2024-11-10 VITALS — BP 102/64 | HR 73 | Ht 62.0 in | Wt 241.0 lb

## 2024-11-10 VITALS — BP 102/60 | HR 84 | Ht 62.0 in | Wt 240.5 lb

## 2024-11-10 DIAGNOSIS — R079 Chest pain, unspecified: Secondary | ICD-10-CM | POA: Diagnosis not present

## 2024-11-10 DIAGNOSIS — K449 Diaphragmatic hernia without obstruction or gangrene: Secondary | ICD-10-CM

## 2024-11-10 DIAGNOSIS — R002 Palpitations: Secondary | ICD-10-CM | POA: Diagnosis not present

## 2024-11-10 DIAGNOSIS — K219 Gastro-esophageal reflux disease without esophagitis: Secondary | ICD-10-CM | POA: Diagnosis not present

## 2024-11-10 DIAGNOSIS — D649 Anemia, unspecified: Secondary | ICD-10-CM

## 2024-11-10 MED ORDER — VOQUEZNA 20 MG PO TABS: 20.0000 mg | ORAL_TABLET | Freq: Every day | ORAL | 1 refills | Status: DC

## 2024-11-10 NOTE — Progress Notes (Signed)
 11/10/2024 Veronica [name] [id number] January 26, 1991  Discussed the use of AI scribe software for clinical note transcription with the patient, who gave verbal consent to proceed.  History of Present Illness Veronica [name] is a 33 year old female with gastroesophageal reflux disease (GERD) and a hiatal hernia who presents with worsening acid reflux symptoms.  She is a patient of Dr. Pamula.  Over the past few months, she has experienced worsening acid reflux symptoms, prompting a visit to urgent care two months ago. During that visit, she had severe acid reflux accompanied by shortness of breath, which required the use of her inhaler. She was prescribed Maalox, which provided relief, and she now uses it as needed. She also takes famotidine  twice daily.  No PPI currently.  She has a history of using omeprazole  and Nexium , which initially provided relief but later exacerbated her symptoms and caused headaches. Her reflux symptoms have persisted since her first endoscopy in 2013, which revealed a hiatal hernia. Subsequent endoscopies have shown variations in the size of the hernia, with the most recent in 2020 indicating a size of four centimeters.  She experiences shortness of breath and upper abdominal bloating after meals. She has had significant weight fluctuations, losing weight from 320 pounds in 2020 to 223 pounds, but has since regained up to 240 pounds. She is actively managing her weight through diet and exercise, attending the gym three times a week despite a busy schedule with work and caring for her children.  Her current medications include famotidine  twice daily and Maalox as needed. She avoids foods that exacerbate her reflux, such as pizza and tomato-based sauces, and has eliminated alcohol and smoking from her lifestyle. She drinks water , cranberry juice, vitamin waters, and coconut water , and occasionally consumes only small amount of orange juice with iron  supplements.  Also  asks about a bulge/protrusion that she noticed last night on her right upper abdomen.  10/27/2019 EGD with Dr. Avram Impression:  - 4 cm hiatal hernia.  - The examination was otherwise normal.  - No specimens collected.  08/13/2012 flexible sigmoidoscopy/EGD with Dr. Albertus for IDA Flex sig: The colonic mucosa appeared normal in the descending colon, sigmoid colon, and rectum.  No source found to explain rectal bleeding or iron  deficiency anemia EGD: Endoscopic impression There was LA class a esophagitis noted The esophagus was otherwise normal 6 cm hiatal hernia Of close of the stomach appeared normal The duodenal mucosa showed no abnormalities in the bulb and second portion of the duodenum  Past Medical History:  Diagnosis Date   Abdominal pain 01/06/2012   Acute tension headache 07/27/2019   Anemia    Ankle pain    Anxiety    Asthma    Birth control counseling 08/26/2011   BMI 40.0-44.9, adult (HCC) 02/27/2011   Dysuria 01/13/2013   Epigastric pain 09/13/2019   GERD (gastroesophageal reflux disease)    Heart murmur    Hiatal hernia    Obesity (BMI 30-39.9)    Pre-eclampsia    off med since 4/23   Secondary amenorrhea 03/25/2018   Trichomonas infection 06/17/2022   Past Surgical History:  Procedure Laterality Date   ADENOIDECTOMY     CESAREAN SECTION  09/02/2010   For macrosomia, but son was 7 lb 12 oz   CESAREAN SECTION N/A 10/06/2021   Procedure: CESAREAN SECTION;  Surgeon: Izell Harari, MD;  Location: [facility];  Service: Obstetrics;  Laterality: N/A;   CHOLECYSTECTOMY N/A 06/16/2022   Procedure:  LAPAROSCOPIC  CHOLECYSTECTOMY;  Surgeon: Signe Mitzie LABOR, MD;  Location: [facility];  Service: General;  Laterality: N/A;   TONSILLECTOMY AND ADENOIDECTOMY      reports that she has quit smoking. Her smoking use included cigarettes. She has never used smokeless tobacco. She reports that she does not currently use alcohol. She reports that she does not currently use  drugs. family history includes Allergic rhinitis in her sister; Diabetes in her mother and sister; Heart attack (age of onset: 70) in her mother; Hypertension in her mother; Obesity in her mother; Other in her sister. Allergies  Allergen Reactions   Morphine  Anaphylaxis   Ibuprofen  Nausea And Vomiting    Was told to not take this because it would flare stomach issues   Morphine  And Codeine  Other (See Comments)    Causes chest to be heavy      Outpatient Encounter Medications as of 11/10/2024  Medication Sig   albuterol  (VENTOLIN  HFA) 108 (90 Base) MCG/ACT inhaler Inhale 2 puffs into the lungs every 6 (six) hours as needed for wheezing or shortness of breath.   alum & mag hydroxide-simeth (MAALOX MAX) 400-400-40 MG/5ML suspension Take 10 mLs by mouth every 6 (six) hours as needed for indigestion (20 min prior to meals as needed).   azelastine  (ASTELIN ) 0.1 % nasal spray Place 1 spray into both nostrils 2 (two) times daily. Use in each nostril as directed   azelastine  (OPTIVAR ) 0.05 % ophthalmic solution Place 1 drop into both eyes 2 (two) times daily.   Blood Pressure KIT Please use to check blood pressure as needed.   budesonide -formoterol  (SYMBICORT ) 160-4.5 MCG/ACT inhaler Inhale 2 puffs into the lungs 2 (two) times daily.   clonazePAM (KLONOPIN) 1 MG tablet Take 1 mg by mouth daily as needed for anxiety.   EPINEPHrine  (AUVI-Q ) 0.3 mg/0.3 mL IJ SOAJ injection Inject 0.3 mg into the muscle as needed for anaphylaxis.   etonogestrel  (NEXPLANON ) 68 MG IMPL implant 1 each by Subdermal route once.   famotidine  (PEPCID ) 20 MG tablet Take 1 tablet (20 mg total) by mouth 2 (two) times daily.   ferrous sulfate  (FEROSUL) 325 (65 FE) MG tablet Take 1 tablet (325 mg total) by mouth every other day.   FLUoxetine  (PROZAC ) 20 MG capsule Take 20 mg by mouth daily.   FLUoxetine  (PROZAC ) 20 MG tablet Take 20 mg by mouth daily.   fluticasone  (FLONASE ) 50 MCG/ACT nasal spray Place 2 sprays into both  nostrils daily.   fluticasone -salmeterol (ADVAIR HFA) 115-21 MCG/ACT inhaler Inhale 2 puffs into the lungs 2 (two) times daily.   hydrocortisone  2.5 % cream Apply topically 2 (two) times daily.   hydroxypropyl methylcellulose / hypromellose (ISOPTO TEARS / GONIOVISC) 2.5 % ophthalmic solution Place 1 drop into both eyes 4 (four) times daily as needed for dry eyes.   loratadine  (CLARITIN ) 10 MG tablet Take 1 tablet (10 mg total) by mouth 2 (two) times daily.   montelukast  (SINGULAIR ) 10 MG tablet Take 1 tablet (10 mg total) by mouth at bedtime.   omalizumab  (XOLAIR ) 300 MG/2  ML prefilled syringe Inject 300 mg into the skin every 28 (twenty-eight) days.   polyethylene glycol (MIRALAX  / GLYCOLAX ) 17 g packet Take 17 g by mouth 2 (two) times daily. (Patient taking differently: Take 17 g by mouth as needed.)   senna (SENOKOT) 8.6 MG TABS tablet Take 1 tablet (8.6 mg total) by mouth 2 (two) times daily.   triamcinolone  ointment (KENALOG ) 0.1 % Apply 1 Application topically 2 (two)  times daily.   esomeprazole  (NEXIUM ) 20 MG capsule Take 1 capsule (20 mg total) by mouth daily. (Patient not taking: Reported on 11/10/2024)   fluticasone  (FLOVENT  HFA) 110 MCG/ACT inhaler Inhale 1 puff into the lungs as needed. (Patient not taking: Reported on 11/10/2024)   No facility-administered encounter medications on file as of 11/10/2024.    REVIEW OF SYSTEMS  : All other systems reviewed and negative except where noted in the History of Present Illness.   PHYSICAL EXAM: BP 102/60   Pulse 84   Ht 5' 2 (1.575 m)   Wt 240 lb 8 oz (109.1 kg)   BMI 43.99 kg/m  General: Well developed AA female in no acute distress Head: Normocephalic and atraumatic Eyes:  Sclerae anicteric, conjunctiva pink. Ears: Normal auditory acuity Lungs: Clear throughout to auscultation; no W/R/R. Heart: Regular rate and rhythm; no M/R/G. Abdomen: Soft,  some protuberance on the right side when standing, question incisional  hernia. Musculoskeletal: Symmetrical with no gross deformities  Skin: No lesions on visible extremities Extremities: No edema  Neurological: Alert oriented x 4, grossly non-focal Psychological:  Alert and cooperative. Normal mood and affect  Assessment & Plan Gastroesophageal reflux disease Chronic GERD with severe symptoms, including acid reflux and shortness of breath postprandially. Previous treatments with famotidine  and Maalox provide temporary relief. omeprazole  and Nexium  were ineffective or caused adverse effects. Hiatal hernia likely contributing to symptoms. Discussed potential surgical options for hiatal hernia repair, including endoscopic and surgical approaches, depending on hernia size. Voquezna , a new medication, was discussed as a treatment option. - Prescribed Voquezna  20 mg daily for two months, then reduce to 10 mg daily. - Continue famotidine  as needed, preferably at bedtime. - Scheduled endoscopy to reassess hiatal hernia size. - Provided dietary guidance to avoid foods that exacerbate reflux, such as tomato-based sauces, citrus, coffee, spicy foods, and peppermint. - Advised on lifestyle modifications, including avoiding lying down within 2-3 hours after eating. -Also complains of some early satiety, which could be from the hernia but consider GES as well.  Hiatal hernia Contributing to GERD symptoms. Previous endoscopies showed varying sizes (6 cm and 4 cm). Weight loss may have impacted size.  - Scheduled endoscopy to reassess hiatal hernia size ,etc. - Will discuss potential surgical options based on endoscopy findings, but she may benefit from repair.  Abdominal wall incisional hernia Possible abdominal wall incisional hernia near previous gallbladder surgery scar. Not currently causing significant symptoms but may become bothersome. Weight loss and core strengthening exercises may help manage symptoms. - Monitor hernia for changes in size or symptoms. - Encouraged  core strengthening exercises to support abdominal wall. - Advised to consult a surgeon if hernia becomes bothersome.   CC:  Lonnie Earnest, MD

## 2024-11-10 NOTE — Patient Instructions (Addendum)
 We have sent the following medications to your pharmacy for you to pick up at your convenience: Voquezna 20 mg daily for 8 weeks.    You have been scheduled for an endoscopy. Please follow written instructions given to you at your visit today.  If you use inhalers (even only as needed), please bring them with you on the day of your procedure.  If you take any of the following medications, they will need to be adjusted prior to your procedure:   DO NOT TAKE 7 DAYS PRIOR TO TEST- Trulicity (dulaglutide) Ozempic , Wegovy (semaglutide ) Mounjaro , Zepbound  (tirzepatide ) Bydureon Bcise (exanatide extended release)  DO NOT TAKE 1 DAY PRIOR TO YOUR TEST Rybelsus (semaglutide ) Adlyxin (lixisenatide) Victoza  (liraglutide ) Byetta (exanatide) ___________________________________________________________________________

## 2024-11-10 NOTE — Progress Notes (Signed)
 Cardiology Office Note:    Date:  11/10/2024   ID:  Veronica [name], DOB 11-17-1991, MRN [id number]  PCP:  Lonnie Earnest, MD  Cardiologist:  Oneil Parchment, MD  Electrophysiologist:  None   Referring MD: Lonnie Earnest, MD   Chief Complaint  Patient presents with   Palpitations    History of Present Illness:    Veronica [name] is a 33 y.o. female with a hx of asthma, morbid obesity who presents for follow-up.  She was seen in the ED on 02/27/2024.  Workup unremarkable, including negative troponins x 2.  She reports has been having chest pain, shortness of breath, and feeling heart is racing.  Suspected was having panic attacks, was occurring frequently earlier this year.  States that she feels these symptoms when she is stressed.  She describes chest pain as burning in center of her chest.  Can last 20 minutes.  She goes for walks for exercise, denies any exertional chest pain.  She denies any exertional dyspnea.  Reports some lightheadedness but denies any syncope.  Was having palpitations about twice per week, typically lasts for short duration.  No smoking history.  Family history includes her mother died of MI at age 41.  ETT 2024/07/17 showed good exercise capacity (9.9 METS), no evidence of ischemia.  Echocardiogram 07/21/2024 showed normal biventricular function, no significant valvular disease.  Zio patch x 14 days 05/2024 showed no significant arrhythmias.  Since last clinic visit, she reports she is doing okay.  She goes to gym 3 days/week, does treadmill and weightlifting, will work out for about 1 to 1.5 hours.  Denies any chest pain.  Does report occasional dyspnea.  She denies any lightheadedness, syncope, or lower extremity edema.  Does report occasional palpitations that last for short duration, occur 1-2 times per week.   Past Medical History:  Diagnosis Date   Abdominal pain 01/06/2012   Acute tension headache 07/27/2019   Anemia    Ankle pain    Anxiety    Asthma     Birth control counseling 08/26/2011   BMI 40.0-44.9, adult (HCC) 02/27/2011   Dysuria 01/13/2013   Epigastric pain 09/13/2019   GERD (gastroesophageal reflux disease)    Heart murmur    Hiatal hernia    Obesity (BMI 30-39.9)    Pre-eclampsia    off med since 4/23   Secondary amenorrhea 03/25/2018   Trichomonas infection 06/17/2022    Past Surgical History:  Procedure Laterality Date   ADENOIDECTOMY     CESAREAN SECTION  09/02/2010   For macrosomia, but son was 7 lb 12 oz   CESAREAN SECTION N/A 10/06/2021   Procedure: CESAREAN SECTION;  Surgeon: Izell Harari, MD;  Location: [facility];  Service: Obstetrics;  Laterality: N/A;   CHOLECYSTECTOMY N/A 06/16/2022   Procedure: LAPAROSCOPIC  CHOLECYSTECTOMY;  Surgeon: Signe Mitzie LABOR, MD;  Location: [facility];  Service: General;  Laterality: N/A;   TONSILLECTOMY AND ADENOIDECTOMY      Current Medications: Current Meds  Medication Sig   hydrOXYzine  (VISTARIL ) 50 MG capsule Take 50 mg by mouth 3 (three) times daily.   senna (SENOKOT) 8.6 MG TABS tablet Take 1 tablet (8.6 mg total) by mouth 2 (two) times daily. (Patient taking differently: Take 1 tablet by mouth daily as needed.)   triamcinolone  ointment (KENALOG ) 0.1 % Apply 1 Application topically 2 (two) times daily. (Patient taking differently: Apply 1 Application topically as needed.)     Allergies:   Morphine , Ibuprofen , and Morphine  and  codeine    Social History   Socioeconomic History   Marital status: Significant Other    Spouse name: Not on file   Number of children: 2   Years of education: Not on file   Highest education level: Not on file  Occupational History   Occupation: home health care  Tobacco Use   Smoking status: Former    Types: Cigarettes   Smokeless tobacco: Never  Vaping Use   Vaping status: Never Used  Substance and Sexual Activity   Alcohol use: Not Currently   Drug use: Not Currently    Comment: last used in teens   Sexual activity: Yes     Partners: Male    Birth control/protection: Implant  Other Topics Concern   Not on file  Social History Narrative   Lives son ([name] 09/02/2010 ), sister Lanney Waddill 10/28/94), and mom ([name]) and mom's fiance.    Attending [employer] to obtain GED.   Mom's fiance smokes outside house.    Pet: dog               Social Drivers of Corporate Investment Banker Strain: Low Risk  (01/29/2022)   Overall Financial Resource Strain (CARDIA)    Difficulty of Paying Living Expenses: Not hard at all  Food Insecurity: No Food Insecurity (08/13/2021)   Hunger Vital Sign    Worried About Running Out of Food in the Last Year: Never true    Ran Out of Food in the Last Year: Never true  Transportation Needs: No Transportation Needs (08/13/2021)   PRAPARE - Administrator, Civil Service (Medical): No    Lack of Transportation (Non-Medical): No  Physical Activity: Sufficiently Active (01/29/2022)   Exercise Vital Sign    Days of Exercise per Week: 3 days    Minutes of Exercise per Session: 60 min  Stress: Stress Concern Present (01/29/2022)   [employer] of Occupational Health - Occupational Stress Questionnaire    Feeling of Stress : Very much  Social Connections: Socially Integrated (01/29/2022)   Social Connection and Isolation Panel    Frequency of Communication with Friends and Family: Three times a week    Frequency of Social Gatherings with Friends and Family: Twice a week    Attends Religious Services: More than 4 times per year    Active Member of [employer] or Organizations: Yes    Attends Engineer, Structural: More than 4 times per year    Marital Status: Living with partner     Family History: The patient's family history includes Allergic rhinitis in her sister; Diabetes in her mother and sister; Heart attack (age of onset: 51) in her mother; Hypertension in her mother; Obesity in her mother; Other in her sister. There is no history of Esophageal  cancer, Colon cancer, or Liver disease.  ROS:   Please see the history of present illness.     All other systems reviewed and are negative.  EKGs/Labs/Other Studies Reviewed:    The following studies were reviewed today:   EKG:   05/31/2024: Normal sinus rhythm, rate 71, no ST abnormalities 11/10/24: NSR, rate 73, no ST abnormalities  Recent Labs: 04/25/2024: Magnesium  2.1 06/11/2024: BUN 8; Creatinine, Ser 1.09; Potassium 3.7; Sodium 137 08/11/2024: ALT 10; Hemoglobin 12.3; Platelets 353; TSH 1.550  Recent Lipid Panel    Component Value Date/Time   CHOL 136 03/16/2023 1723   TRIG 148 03/16/2023 1723   HDL 35 (L) 03/16/2023 1723   CHOLHDL  3.9 03/16/2023 1723   LDLCALC 75 03/16/2023 1723   LDLDIRECT 66 11/27/2023 1453    Physical Exam:    VS:  BP 102/64 (BP Location: Left Arm, Patient Position: Sitting, Cuff Size: Large)   Pulse 73   Ht 5' 2 (1.575 m)   Wt 241 lb (109.3 kg)   SpO2 96%   BMI 44.08 kg/m     Wt Readings from Last 3 Encounters:  11/10/24 241 lb (109.3 kg)  11/10/24 240 lb 8 oz (109.1 kg)  10/10/24 231 lb (104.8 kg)     GEN:  Well nourished, well developed in no acute distress HEENT: Normal NECK: No JVD; No carotid bruits LYMPHATICS: No lymphadenopathy CARDIAC: RRR, no murmurs, rubs, gallops RESPIRATORY:  Clear to auscultation without rales, wheezing or rhonchi  ABDOMEN: Soft, non-tender, non-distended MUSCULOSKELETAL:  No edema; No deformity  SKIN: Warm and dry NEUROLOGIC:  Alert and oriented x 3 PSYCHIATRIC:  Normal affect   ASSESSMENT:    1. Palpitations   2. Morbid obesity (HCC)   3. Chest pain of uncertain etiology     PLAN:    Chest pain: Atypical in description.  Given age, would have low suspicion for obstructive CAD but does have significant family history (mother died of MI at age 29).  ETT Jul 16, 2024 showed good exercise capacity (9.9 METS), no evidence of ischemia.  Echocardiogram 07/21/2024 showed normal biventricular function, no  significant valvular disease.   - Reports no recent chest pain  Palpitations: Zio patch x 14 days 05/2024 showed no significant arrhythmias.  Morbid obesity: Body mass index is 44.08 kg/m.  Will refer to healthy weight and wellness  RTC in 1 year   Medication Adjustments/Labs and Tests Ordered: Current medicines are reviewed at length with the patient today.  Concerns regarding medicines are outlined above.  Orders Placed This Encounter  Procedures   Amb Ref to Medical Weight Management   EKG 12-Lead   No orders of the defined types were placed in this encounter.   Patient Instructions  Medication Instructions:  No changes *If you need a refill on your cardiac medications before your next appointment, please call your pharmacy*  Lab Work: None ordered If you have labs (blood work) drawn today and your tests are completely normal, you will receive your results only by: MyChart Message (if you have MyChart) OR A paper copy in the mail If you have any lab test that is abnormal or we need to change your treatment, we will call you to review the results.  Testing/Procedures: None ordered  Follow-Up: At [facility], you and your health needs are our priority.  As part of our continuing mission to provide you with exceptional heart care, our providers are all part of one team.  This team includes your primary Cardiologist (physician) and Advanced Practice Providers or APPs (Physician Assistants and Nurse Practitioners) who all work together to provide you with the care you need, when you need it.  Your next appointment:   1 year(s)  Provider:   Dr Kate  We recommend signing up for the patient portal called MyChart.  Sign up information is provided on this After Visit Summary.  MyChart is used to connect with patients for Virtual Visits (Telemedicine).  Patients are able to view lab/test results, encounter notes, upcoming appointments, etc.  Non-urgent messages can  be sent to your provider as well.   To learn more about what you can do with MyChart, go to [email or web address].  Signed, Lonni LITTIE Nanas, MD  11/10/2024 5:18 PM     [facility]

## 2024-11-10 NOTE — Patient Instructions (Signed)
 Medication Instructions:  No changes *If you need a refill on your cardiac medications before your next appointment, please call your pharmacy*  Lab Work: None ordered If you have labs (blood work) drawn today and your tests are completely normal, you will receive your results only by: MyChart Message (if you have MyChart) OR A paper copy in the mail If you have any lab test that is abnormal or we need to change your treatment, we will call you to review the results.  Testing/Procedures: None ordered  Follow-Up: At [facility], you and your health needs are our priority.  As part of our continuing mission to provide you with exceptional heart care, our providers are all part of one team.  This team includes your primary Cardiologist (physician) and Advanced Practice Providers or APPs (Physician Assistants and Nurse Practitioners) who all work together to provide you with the care you need, when you need it.  Your next appointment:   1 year(s)  Provider:   Dr Kate  We recommend signing up for the patient portal called MyChart.  Sign up information is provided on this After Visit Summary.  MyChart is used to connect with patients for Virtual Visits (Telemedicine).  Patients are able to view lab/test results, encounter notes, upcoming appointments, etc.  Non-urgent messages can be sent to your provider as well.   To learn more about what you can do with MyChart, go to [email or web address].

## 2024-11-11 MED ORDER — FERROUS SULFATE 325 (65 FE) MG PO TABS: 325.0000 mg | ORAL_TABLET | ORAL | 2 refills | Status: AC

## 2024-11-15 ENCOUNTER — Telehealth: Payer: Self-pay | Admitting: Gastroenterology

## 2024-11-15 ENCOUNTER — Other Ambulatory Visit: Payer: Self-pay | Admitting: Allergy

## 2024-11-15 NOTE — Telephone Encounter (Signed)
 Patient LVM on nurse line stating that her iron  pills had been denied.   Called pharmacy. Pharmacist advised that insurance denied claim due to medication being OTC. He said they could get this ready for patient at cash price of $4.39.  Called patient and advised of update. Patient is fine with this and will pick up later today.   Chiquita JAYSON English, RN

## 2024-11-15 NOTE — Telephone Encounter (Signed)
 Patient called stating she has not received Voquenza medication at pharmacy. Advised patient medication went to [facility]. Patient is unsure what this pharmacy is. Requesting a call to clarify. Please advise, thank you

## 2024-11-16 ENCOUNTER — Other Ambulatory Visit (HOSPITAL_COMMUNITY): Payer: Self-pay

## 2024-11-16 ENCOUNTER — Telehealth: Payer: Self-pay

## 2024-11-16 MED ORDER — VOQUEZNA 20 MG PO TABS: 20.0000 mg | ORAL_TABLET | Freq: Every day | ORAL | 1 refills | Status: DC

## 2024-11-16 NOTE — Telephone Encounter (Signed)
 PA request has been Submitted. New Encounter has been or will be created for follow up. For additional info see Pharmacy Prior Auth telephone encounter from 11-16-2024.

## 2024-11-16 NOTE — Telephone Encounter (Signed)
 Sent script to [facility]. Please submit for PA

## 2024-11-16 NOTE — Telephone Encounter (Signed)
 Pharmacy Patient Advocate Encounter   Received notification from Pt Calls Messages that prior authorization for Voquezna  20MG  tablets is required/requested.   Insurance verification completed.   The patient is insured through [facility].   Per test claim: PA required; PA submitted to above mentioned insurance via Latent Key/confirmation #/EOC [id number] Status is pending

## 2024-11-17 ENCOUNTER — Other Ambulatory Visit (HOSPITAL_COMMUNITY): Payer: Self-pay

## 2024-11-17 ENCOUNTER — Ambulatory Visit (INDEPENDENT_AMBULATORY_CARE_PROVIDER_SITE_OTHER): Admitting: *Deleted

## 2024-11-17 DIAGNOSIS — F410 Panic disorder [episodic paroxysmal anxiety] without agoraphobia: Secondary | ICD-10-CM | POA: Diagnosis not present

## 2024-11-17 DIAGNOSIS — J4540 Moderate persistent asthma, uncomplicated: Secondary | ICD-10-CM

## 2024-11-17 MED ORDER — OMALIZUMAB 300 MG/2  ML ~~LOC~~ SOSY
300.0000 mg | PREFILLED_SYRINGE | Freq: Once | SUBCUTANEOUS | Status: AC
  Administered 2024-11-17: 300 mg via SUBCUTANEOUS

## 2024-11-17 NOTE — Telephone Encounter (Signed)
 Pharmacy Patient Advocate Encounter  Received notification from [facility] that Prior Authorization for Voquezna  20MG  tablets has been APPROVED from 11-16-2024 to 11-16-2025   PA #/Case ID/Reference #: [id number]

## 2024-11-17 NOTE — Telephone Encounter (Signed)
 Left message for patient to call office.

## 2024-11-17 NOTE — Progress Notes (Signed)
 Immunotherapy   Patient Details  Name: Veronica [name] MRN: [id number] Date of Birth: [birth year]  11/17/2024  [name] started injections for  Xolair  300  Frequency: Every 4 weeks  Epi-Pen: Yes  Consent signed and patient instructions given. Patient started Xolair  and received 300mg  in the [facility]. Patient waited 1 hour in office and did not experience any issues.   [name] 11/17/2024, 2:45 PM

## 2024-11-17 NOTE — Telephone Encounter (Signed)
Patient aware medication has been approved.

## 2024-12-05 ENCOUNTER — Other Ambulatory Visit: Payer: Self-pay

## 2024-12-07 ENCOUNTER — Other Ambulatory Visit (HOSPITAL_COMMUNITY): Payer: Self-pay

## 2024-12-07 NOTE — Progress Notes (Signed)
 Specialty Pharmacy Refill Coordination Note  Veronica [name] is a 33 y.o. female contacted today regarding refills of specialty medication(s) Omalizumab  (Xolair )   Patient requested Courier to Provider Office   Delivery date: 12/13/24   Verified address: [address] [place], [place] [address]   Medication will be filled on: 12/12/24

## 2024-12-12 ENCOUNTER — Other Ambulatory Visit: Payer: Self-pay

## 2024-12-12 ENCOUNTER — Encounter: Payer: Self-pay | Admitting: Allergy

## 2024-12-12 NOTE — Progress Notes (Signed)
 cc on file declined, attempted to call patient but no answer and line disconnected before i could lvm

## 2024-12-15 ENCOUNTER — Other Ambulatory Visit: Payer: Self-pay | Admitting: Pharmacy Technician

## 2024-12-15 ENCOUNTER — Other Ambulatory Visit: Payer: Self-pay

## 2024-12-15 ENCOUNTER — Ambulatory Visit

## 2024-12-15 NOTE — Progress Notes (Signed)
 Disenrolled; patient discontinued therapy. Rph Holly agree to [employer]

## 2024-12-19 NOTE — Telephone Encounter (Signed)
 Called and spoke with the patient and went over starting allergy  injections in great detail. Patient has been scheduled to start allergy  injections in February in the [place] office.

## 2024-12-22 ENCOUNTER — Ambulatory Visit: Admitting: Internal Medicine

## 2024-12-22 NOTE — Progress Notes (Unsigned)
 Pt drank a 16 oz bottle of water  at 0830. MD and CRNA made aware. Pt informed that her procedure would have to be delayed.  Pt opted to reschedule because she did not have a care partner available. Pt  rescheduled for 03/02/25. New instructions sent with patient and sent to her Mychart.

## 2024-12-26 ENCOUNTER — Ambulatory Visit (HOSPITAL_COMMUNITY)
Admission: EM | Admit: 2024-12-26 | Discharge: 2024-12-26 | Disposition: A | Discharge status: Home / Self Care | Attending: Nurse Practitioner | Admitting: Nurse Practitioner

## 2024-12-26 ENCOUNTER — Encounter (HOSPITAL_COMMUNITY): Payer: Self-pay

## 2024-12-26 ENCOUNTER — Encounter (HOSPITAL_BASED_OUTPATIENT_CLINIC_OR_DEPARTMENT_OTHER): Payer: Self-pay

## 2024-12-26 ENCOUNTER — Emergency Department (HOSPITAL_BASED_OUTPATIENT_CLINIC_OR_DEPARTMENT_OTHER)
Admission: EM | Admit: 2024-12-26 | Discharge: 2024-12-26 | Disposition: A | Discharge status: Home / Self Care | Attending: Emergency Medicine | Admitting: Emergency Medicine

## 2024-12-26 ENCOUNTER — Telehealth: Payer: Self-pay

## 2024-12-26 ENCOUNTER — Emergency Department (HOSPITAL_BASED_OUTPATIENT_CLINIC_OR_DEPARTMENT_OTHER)

## 2024-12-26 ENCOUNTER — Other Ambulatory Visit: Payer: Self-pay

## 2024-12-26 DIAGNOSIS — R509 Fever, unspecified: Secondary | ICD-10-CM | POA: Diagnosis not present

## 2024-12-26 DIAGNOSIS — R1013 Epigastric pain: Secondary | ICD-10-CM | POA: Diagnosis not present

## 2024-12-26 DIAGNOSIS — E8889 Other specified metabolic disorders: Secondary | ICD-10-CM | POA: Diagnosis not present

## 2024-12-26 DIAGNOSIS — R1031 Right lower quadrant pain: Secondary | ICD-10-CM | POA: Insufficient documentation

## 2024-12-26 DIAGNOSIS — R1033 Periumbilical pain: Secondary | ICD-10-CM | POA: Diagnosis not present

## 2024-12-26 DIAGNOSIS — R109 Unspecified abdominal pain: Secondary | ICD-10-CM

## 2024-12-26 DIAGNOSIS — R1032 Left lower quadrant pain: Secondary | ICD-10-CM | POA: Diagnosis not present

## 2024-12-26 DIAGNOSIS — R110 Nausea: Secondary | ICD-10-CM

## 2024-12-26 LAB — COMPREHENSIVE METABOLIC PANEL WITH GFR
ALT: 11 U/L (ref 0–44)
AST: 10 U/L — ABNORMAL LOW (ref 15–41)
Albumin: 4.4 g/dL (ref 3.5–5.0)
Alkaline Phosphatase: 73 U/L (ref 38–126)
Anion gap: 12 (ref 5–15)
BUN: 14 mg/dL (ref 6–20)
CO2: 22 mmol/L (ref 22–32)
Calcium: 9 mg/dL (ref 8.9–10.3)
Chloride: 104 mmol/L (ref 98–111)
Creatinine, Ser: 0.95 mg/dL (ref 0.44–1.00)
GFR, Estimated: >60 mL/min
Glucose, Bld: 87 mg/dL (ref 70–99)
Potassium: 3.8 mmol/L (ref 3.5–5.1)
Sodium: 138 mmol/L (ref 135–145)
Total Bilirubin: 0.8 mg/dL (ref 0.0–1.2)
Total Protein: 8.1 g/dL (ref 6.5–8.1)

## 2024-12-26 LAB — CBC
HCT: 40.3 % (ref 36.0–46.0)
Hemoglobin: 13.5 g/dL (ref 12.0–15.0)
MCH: 28.4 pg (ref 26.0–34.0)
MCHC: 33.5 g/dL (ref 30.0–36.0)
MCV: 84.7 fL (ref 80.0–100.0)
Platelets: 312 K/uL (ref 150–400)
RBC: 4.76 MIL/uL (ref 3.87–5.11)
RDW: 13.7 % (ref 11.5–15.5)
WBC: 9.6 K/uL (ref 4.0–10.5)
nRBC: 0 % (ref 0.0–0.2)

## 2024-12-26 LAB — POCT URINE DIPSTICK
Bilirubin, UA: NEGATIVE
Blood, UA: NEGATIVE
Glucose, UA: NEGATIVE mg/dL
Leukocytes, UA: NEGATIVE
Nitrite, UA: NEGATIVE
POC PROTEIN,UA: NEGATIVE
Spec Grav, UA: 1.02
Urobilinogen, UA: 0.2 U/dL
pH, UA: 6

## 2024-12-26 LAB — URINALYSIS, ROUTINE W REFLEX MICROSCOPIC
Bilirubin Urine: NEGATIVE
Glucose, UA: NEGATIVE mg/dL
Ketones, ur: 15 mg/dL — AB
Leukocytes,Ua: NEGATIVE
Nitrite: NEGATIVE
Protein, ur: NEGATIVE mg/dL
Specific Gravity, Urine: 1.015 (ref 1.005–1.030)
pH: 6.5 (ref 5.0–8.0)

## 2024-12-26 LAB — URINALYSIS, MICROSCOPIC (REFLEX)

## 2024-12-26 LAB — PREGNANCY, URINE: Preg Test, Ur: NEGATIVE

## 2024-12-26 LAB — LIPASE, BLOOD: Lipase: 19 U/L (ref 11–51)

## 2024-12-26 LAB — POCT URINE PREGNANCY: Preg Test, Ur: NEGATIVE

## 2024-12-26 MED ORDER — PANTOPRAZOLE SODIUM 20 MG PO TBEC: 20.0000 mg | DELAYED_RELEASE_TABLET | Freq: Every day | ORAL | 0 refills | Status: DC

## 2024-12-26 MED ORDER — IOHEXOL 300 MG/ML  SOLN
100.0000 mL | Freq: Once | INTRAMUSCULAR | Status: AC | PRN
  Administered 2024-12-26: 100 mL via INTRAVENOUS

## 2024-12-26 MED ORDER — ALUM & MAG HYDROXIDE-SIMETH 200-200-20 MG/5ML PO SUSP
30.0000 mL | Freq: Once | ORAL | Status: DC
  Filled 2024-12-26: qty 30

## 2024-12-26 MED ORDER — PANTOPRAZOLE SODIUM 40 MG IV SOLR
40.0000 mg | Freq: Once | INTRAVENOUS | Status: AC
  Administered 2024-12-26: 40 mg via INTRAVENOUS
  Filled 2024-12-26: qty 10

## 2024-12-26 MED ORDER — FAMOTIDINE IN NACL 20-0.9 MG/50ML-% IV SOLN
20.0000 mg | Freq: Once | INTRAVENOUS | Status: AC
  Administered 2024-12-26: 20 mg via INTRAVENOUS
  Filled 2024-12-26: qty 50

## 2024-12-26 NOTE — ED Notes (Signed)
 Provider at bedside.

## 2024-12-26 NOTE — Discharge Instructions (Addendum)
 You were seen today for sudden abdominal pain that started this morning and has spread across your abdomen. You also have nausea, chills, and a low-grade fever. Your exam and urine testing raise concern for a possible abdominal condition that needs further testing, which cannot be safely completed in urgent care. Do not eat or drink anything from this point forward. You are stable to go to the emergency department by private vehicle for further evaluation, including labs and imaging to determine the cause of your pain. Go directly to the emergency department!    [facility] Emergency Department at [facility] [address] - [address] Floor, [place], [place] [address]

## 2024-12-26 NOTE — Discharge Instructions (Addendum)
 You are seen today for periumbilical pain.  Suspect is likely secondary to fat-containing hernia.  Recommend continued follow-up with your PCP, taking Protonix  and Pepcid  as prescribed.  Return to ER for any new or worsening symptoms.  Continue to follow-up with your PCP and with GI.

## 2024-12-26 NOTE — ED Provider Notes (Signed)
 " [facility] Provider Note   CSN: [id number] Arrival date & time: 12/26/24  2119     Patient presents with: Abdominal Pain   Veronica [name] is a 34 y.o. female.  Abdominal Pain Patient is a 34 year old female has been ED for concerns for bilateral lower extremity abdominal pain radiating to right flank, localizing to right lower quadrant abdomen that has been migrating across her stomach, starting this morning.  Noted to additionally been having cramps in her epigastric region.  Notes we does have pain worse when laying back.  Noted to have been accompanied with chills.  Went to urgent care who recommended that she come here to be evaluated with CT scans.  Previous medical history of  GERD, headache, anxiety.  Not sexually active in over a year.  Denies fever, headache, vision changes, chest pain, shortness of breath, vomiting, diarrhea, melena, medic easy, dysuria, hematuria, vaginal discharge, vaginal bleeding, vaginal pain, lower leg swelling, rashes.    Prior to Admission medications  Medication Sig Start Date End Date Taking? Authorizing Provider  pantoprazole  (PROTONIX ) 20 MG tablet Take 1 tablet (20 mg total) by mouth daily. 12/26/24  Yes Aaric Dolph S, [name]  albuterol  (VENTOLIN  HFA) 108 (90 Base) MCG/ACT inhaler Inhale 2 puffs into the lungs every 6 (six) hours as needed for wheezing or shortness of breath. 08/27/24   Sofia, Leslie K, [name]  azelastine  (ASTELIN ) 0.1 % nasal spray Place 1 spray into both nostrils 2 (two) times daily. Use in each nostril as directed 10/10/24   Mayer, Jodi R, NP  azelastine  (OPTIVAR ) 0.05 % ophthalmic solution Place 1 drop into both eyes 2 (two) times daily. 10/10/24   Mayer, Jodi R, NP  Blood Pressure KIT Please use to check blood pressure as needed. 07/15/23   Delores Suzann HERO, MD  budesonide -formoterol  (SYMBICORT ) 160-4.5 MCG/ACT inhaler Inhale 2 puffs into the lungs 2 (two) times daily. 09/02/24    Alena Morrison, Reagan, MD  etonogestrel  (NEXPLANON ) 68 MG IMPL implant 1 each by Subdermal route once.    [provider]  famotidine  (PEPCID ) 20 MG tablet Take 1 tablet (20 mg total) by mouth 2 (two) times daily. 08/11/24   Jeneal Danita Macintosh, MD  ferrous sulfate  (FEROSUL) 325 (65 FE) MG tablet Take 1 tablet (325 mg total) by mouth every other day. 11/11/24   Baloch, Mahnoor, MD  FLUoxetine  (PROZAC ) 20 MG capsule Take 20 mg by mouth daily. 07/23/24   [provider]  FLUoxetine  (PROZAC ) 20 MG tablet Take 20 mg by mouth daily.    [provider]  fluticasone  (FLONASE ) 50 MCG/ACT nasal spray Place 2 sprays into both nostrils daily. 04/25/24   Jennelle Riis, MD  fluticasone -salmeterol (ADVAIR HFA) 115-21 MCG/ACT inhaler Inhale 2 puffs into the lungs 2 (two) times daily. 04/12/24   Kara Dorn NOVAK, MD  hydrocortisone  2.5 % cream Apply topically 2 (two) times daily. 06/01/24   Enedelia Dorna HERO, FNP  hydroxypropyl methylcellulose / hypromellose (ISOPTO TEARS / GONIOVISC) 2.5 % ophthalmic solution Place 1 drop into both eyes 4 (four) times daily as needed for dry eyes. 10/24/23   Raspet, Erin K, [name]  hydrOXYzine  (VISTARIL ) 50 MG capsule Take 50 mg by mouth 3 (three) times daily. 11/09/24   [provider]  loratadine  (CLARITIN ) 10 MG tablet Take 1 tablet (10 mg total) by mouth 2 (two) times daily. 10/10/24   Mayer, Jodi R, NP  montelukast  (SINGULAIR ) 10 MG tablet TAKE 1 TABLET(10 MG)  BY MOUTH AT BEDTIME 11/15/24   Jeneal Danita Macintosh, MD  senna (SENOKOT) 8.6 MG TABS tablet Take 1 tablet (8.6 mg total) by mouth 2 (two) times daily. Patient taking differently: Take 1 tablet by mouth daily as needed. 05/15/23   Macario Dorothyann HERO, MD  triamcinolone  ointment (KENALOG ) 0.1 % Apply 1 Application topically 2 (two) times daily. Patient taking differently: Apply 1 Application topically as needed. 07/04/24   Reddick, Johnathan B, NP  Vonoprazan Fumarate  (VOQUEZNA ) 20  MG TABS Take 20 mg by mouth daily. 11/16/24   Zehr, Jessica D, [name]    Allergies: Morphine , Ibuprofen , and Morphine  and codeine     Review of Systems  Gastrointestinal:  Positive for abdominal pain.  All other systems reviewed and are negative.   Updated Vital Signs BP 113/71 (BP Location: Left Arm)   Pulse 77   Temp 98.3 F (36.8 C) (Oral)   Resp 16   Ht 5' 2 (1.575 m)   Wt 107 kg   LMP 11/30/2024 (Exact Date)   SpO2 100%   BMI 43.16 kg/m   Physical Exam Vitals and nursing note reviewed.  Constitutional:      General: She is not in acute distress.    Appearance: Normal appearance. She is not ill-appearing or diaphoretic.  HENT:     Head: Normocephalic and atraumatic.  Eyes:     General: No scleral icterus.       Right eye: No discharge.        Left eye: No discharge.     Extraocular Movements: Extraocular movements intact.     Conjunctiva/sclera: Conjunctivae normal.  Cardiovascular:     Rate and Rhythm: Normal rate and regular rhythm.     Pulses: Normal pulses.     Heart sounds: Normal heart sounds. No murmur heard.    No friction rub. No gallop.  Pulmonary:     Effort: Pulmonary effort is normal. No respiratory distress.     Breath sounds: No stridor. No wheezing, rhonchi or rales.  Chest:     Chest wall: No tenderness.  Abdominal:     General: Abdomen is flat. There is no distension.     Palpations: Abdomen is soft.     Tenderness: There is abdominal tenderness in the right lower quadrant, periumbilical area, suprapubic area and left lower quadrant. There is no right CVA tenderness, left CVA tenderness, guarding or rebound.  Musculoskeletal:        General: No swelling, deformity or signs of injury.     Cervical back: Normal range of motion. No rigidity.     Right lower leg: No edema.     Left lower leg: No edema.  Skin:    General: Skin is warm and dry.     Findings: No bruising, erythema or lesion.  Neurological:     General: No focal deficit present.      Mental Status: She is alert and oriented to person, place, and time. Mental status is at baseline.     Sensory: No sensory deficit.     Motor: No weakness.  Psychiatric:        Mood and Affect: Mood normal.     (all labs ordered are listed, but only abnormal results are displayed) Labs Reviewed  COMPREHENSIVE METABOLIC PANEL WITH GFR - Abnormal; Notable for the following components:      Result Value   AST 10 (*)    All other components within normal limits  URINALYSIS, ROUTINE W REFLEX MICROSCOPIC - Abnormal; Notable  for the following components:   Hgb urine dipstick TRACE (*)    Ketones, ur 15 (*)    All other components within normal limits  URINALYSIS, MICROSCOPIC (REFLEX) - Abnormal; Notable for the following components:   Bacteria, UA RARE (*)    All other components within normal limits  LIPASE, BLOOD  CBC  PREGNANCY, URINE    EKG: None  Radiology: CT ABDOMEN PELVIS W CONTRAST Result Date: 12/26/2024 EXAM: CT ABDOMEN AND PELVIS WITH CONTRAST 12/26/2024 11:30:00 PM TECHNIQUE: CT of the abdomen and pelvis was performed with the administration of 100 mL of iohexol  (OMNIPAQUE ) 300 MG/ML solution. Multiplanar reformatted images are provided for review. Automated exposure control, iterative reconstruction, and/or weight-based adjustment of the mA/kV was utilized to reduce the radiation dose to as low as reasonably achievable. COMPARISON: 03/09/2024. CLINICAL HISTORY: RLQ abdominal pain. FINDINGS: LOWER CHEST: Small hiatal hernia. LIVER: The liver is unremarkable. GALLBLADDER AND BILE DUCTS: Status post cholecystectomy. No biliary ductal dilatation. SPLEEN: No acute abnormality. PANCREAS: No acute abnormality. ADRENAL GLANDS: No acute abnormality. KIDNEYS, URETERS AND BLADDER: Cortical scarring in right kidney. No stones in the kidneys or ureters. No hydronephrosis. No perinephric or periureteral stranding. Urinary bladder is unremarkable. GI AND BOWEL: Stomach demonstrates no  acute abnormality. Normal appendix (coronal image 49). There is no bowel obstruction. PERITONEUM AND RETROPERITONEUM: No ascites. No free air. VASCULATURE: Aorta is normal in caliber. LYMPH NODES: No lymphadenopathy. REPRODUCTIVE ORGANS: Uterus is unremarkable. BONES AND SOFT TISSUES: Tiny fat-containing periumbilical hernia. Postprocedural changes related to prior low anterior C-section with stable soft tissue prominence on the right (image 57). No acute osseous abnormality. IMPRESSION: 1. No acute findings in the abdomen or pelvis. Electronically signed by: Pinkie Pebbles MD 12/26/2024 11:36 PM EST RP Workstation: [id number]    Procedures   Medications Ordered in the ED  alum & mag hydroxide-simeth (MAALOX/MYLANTA) 200-200-20 MG/5ML suspension 30 mL (30 mLs Oral Not Given 12/26/24 2315)  iohexol  (OMNIPAQUE ) 300 MG/ML solution 100 mL (100 mLs Intravenous Contrast Given 12/26/24 2330)  famotidine  (PEPCID ) IVPB 20 mg premix (20 mg Intravenous New Bag/Given 12/26/24 2316)  pantoprazole  (PROTONIX ) injection 40 mg (40 mg Intravenous Given 12/26/24 2313)    Medical Decision Making Amount and/or Complexity of Data Reviewed Labs: ordered.  This patient is a 34 year old female who presents to the ED for concern of lower abdominal pain rating to right flank accompanied with chills starting this morning.  Seen by urgent care and sent over here for CT scan.  Currently is having minimal pain and is mostly located over periumbilical region.  Denies any other GU or GI symptoms.  On physical exam, patient is in no acute distress, afebrile, alert and orient x 4, speaking in full sentences, nontachypneic, nontachycardic.  LCTAB, RRR, no murmur.  Notably does have some mild periumbilical abdominal tenderness as well as right lower quadrant pain rating to right flank with no CVA tenderness.  Exam is otherwise unremarkable.  The patient admits she felt cramp in her epigastric region prior to the pain, suspecting  likely gastritis versus enteritis.  Low suspicion for PID, ovarian torsion.  Lab work is unremarkable, only noting some mild ketones trace hemoglobin and rare bacteria on UA.  CT scan does show periumbilical fat-containing hernia, likely cause of patient symptoms today.  Low suspicion for any GU or GI concerns at this time.  Will have her follow-up PCP as needed.  Patient vital signs have remained stable throughout the course of patient's time in the ED. Low suspicion  for any other emergent pathology at this time. I believe this patient is safe to be discharged. Provided strict return to ER precautions. Patient expressed agreement and understanding of plan. All questions were answered.  Differential diagnoses prior to evaluation: The emergent differential diagnosis includes, but is not limited to, gastritis, gastroenteritis, pancreatitis, diverticulitis, appendicitis, ovarian torsion. This is not an exhaustive differential.   Past Medical History / Co-morbidities / Social History:  GERD, headache, anxiety,  Cholecystectomy  Additional history: Chart reviewed. Pertinent results include: Seen by urgent care today for abdominal pain starting around approximate o'clock to the right lower abdomen with radiation to right lower back.  That then became generalized.  Sent from urgent care for concerns for possible need for CT scan of abdomen.  Lab Tests/Imaging studies: I personally interpreted labs/imaging and the pertinent results include: CBC unremarkable CMP unremarkable Lipase unremarkable UA notes ketones trace hemoglobin and rare bacteria but otherwise unremarkable. CT scan abdomen pelvis does not show any acute abnormalities  I agree with the radiologist interpretation.    Medications: I ordered medication including vitals, Pepcid , Protonix .  I have reviewed the patients home medicines and have made adjustments as needed.  Critical Interventions: None  Social Determinants of Health:  None  Disposition: After consideration of the diagnostic results and the patients response to treatment, I feel that the patient would benefit from discharge and treatment as above.   emergency department workup does not suggest an emergent condition requiring admission or immediate intervention beyond what has been performed at this time. The plan is: Follow-up with PCP to establish care, GERD medications, return to ED for new or worsening symptoms. The patient is safe for discharge and has been instructed to return immediately for worsening symptoms, change in symptoms or any other concerns.   Final diagnoses:  Periumbilical pain    ED Discharge Orders          Ordered    pantoprazole  (PROTONIX ) 20 MG tablet  Daily        12/26/24 2346               Beola Terrall RAMAN, [name] 12/26/24 2347  "

## 2024-12-26 NOTE — ED Notes (Signed)
 Patient is being discharged from the Urgent Care and sent to the Emergency Department via POV . Per Veronica [name] patient is in need of higher level of care due to Abdominal pain. Patient is aware and verbalizes understanding of plan of care.  Vitals:   12/26/24 1923  BP: 114/80  Pulse: 78  Resp: 17  Temp: 99.3 F (37.4 C)  SpO2: 98%

## 2024-12-26 NOTE — ED Notes (Signed)
 Patient transferred from waiting room to ED treatment room. Assuming pt care at this time.

## 2024-12-26 NOTE — Telephone Encounter (Signed)
 Dr. Jeneal, will you please place the order for allergy  injections?  Damita, will you please make the vials? Thank you!

## 2024-12-26 NOTE — ED Notes (Addendum)
Patient transported to imaging at this time.

## 2024-12-26 NOTE — ED Triage Notes (Signed)
 Pt has c/o abdominal cramping, that goes to middle of back, and chills that started today. Pt has taken tylenol  at home with no relief. Last dose at 3pm

## 2024-12-26 NOTE — ED Provider Notes (Signed)
 " [facility]    CSN: [id number] Arrival date & time: 12/26/24  1654      History   Chief Complaint Chief Complaint  Patient presents with   Abdominal Cramping   Chills    HPI Veronica [name] is a 34 y.o. female.   Discussed the use of AI scribe software for clinical note transcription with the patient, who gave verbal consent to proceed.   Hye presents with abdominal pain that started early this morning around 9 o'clock. The pain initially began as cramping in the right lower abdomen with radiation to the right lower back, then shifted to the middle of her abdomen, and has now spread across the entire belly. She describes the pain as deep cramps, rating it as moderate in severity, though it seemed like it would get severe at times. The pain was present before eating but worsened after she ate. She has associated chills but denies fever, nausea, vomiting, diarrhea, urinary symptoms, or persistent lower back pain. No vaginal discharge or pelvic pain. She tried taking Tylenol  with no relief. Her appetite remains good and she ate earlier today. Her last menstrual period was on the 24th of last month. She is not on birth control. She has a history of cholecystectomy.   The following sections of the patient's history were reviewed and updated as appropriate: allergies, current medications, past family history, past medical history, past social history, past surgical history, and problem list.      Past Medical History:  Diagnosis Date   Abdominal pain 01/06/2012   Acute tension headache 07/27/2019   Anemia    Ankle pain    Anxiety    Asthma    Birth control counseling 08/26/2011   BMI 40.0-44.9, adult (HCC) 02/27/2011   Dysuria 01/13/2013   Epigastric pain 09/13/2019   GERD (gastroesophageal reflux disease)    Heart murmur    Hiatal hernia    Obesity (BMI 30-39.9)    Pre-eclampsia    off med since 4/23   Secondary amenorrhea 03/25/2018   Trichomonas  infection 06/17/2022    Patient Active Problem List   Diagnosis Date Noted   Allergy  with anaphylaxis due to food 09/22/2024   Chronic urticaria 09/22/2024   Seasonal and perennial allergic rhinitis 09/22/2024   Seasonal allergic conjunctivitis 09/22/2024   Moderate persistent asthma, uncomplicated 09/22/2024   Perioral hyperpigmentation 06/16/2024   Pruritus 06/03/2024   Ear itching 04/25/2024   Paresthesia 04/25/2024   Light headedness 03/31/2024   Panic attacks 02/13/2024   Dizziness 01/08/2024   Constipation 05/17/2023   Nocturnal oxygen desaturation 03/23/2022   Allergic rhinitis 03/07/2022   Asthma 08/31/2020   Back pain 02/13/2020   Hiatal hernia 11/02/2019   Subclinical hypothyroidism 05/19/2019   GAD (generalized anxiety disorder) 05/09/2019   Nonintractable episodic headache 11/15/2018   Seasonal allergies 03/25/2018   Chronic cough 10/16/2017   Fatigue 05/09/2016   GERD (gastroesophageal reflux disease) 03/28/2011   BMI 40.0-44.9, adult (HCC) 02/27/2011    Past Surgical History:  Procedure Laterality Date   ADENOIDECTOMY     CESAREAN SECTION  09/02/2010   For macrosomia, but son was 7 lb 12 oz   CESAREAN SECTION N/A 10/06/2021   Procedure: CESAREAN SECTION;  Surgeon: Izell Harari, MD;  Location: [facility];  Service: Obstetrics;  Laterality: N/A;   CHOLECYSTECTOMY N/A 06/16/2022   Procedure: LAPAROSCOPIC  CHOLECYSTECTOMY;  Surgeon: Signe Mitzie LABOR, MD;  Location: [facility];  Service: General;  Laterality: N/A;   TONSILLECTOMY AND  ADENOIDECTOMY      OB History     Gravida  2   Para  2   Term  2   Preterm      AB      Living  2      SAB      IAB      Ectopic      Multiple  0   Live Births  2            Home Medications    Prior to Admission medications  Medication Sig Start Date End Date Taking? Authorizing Provider  albuterol  (VENTOLIN  HFA) 108 (90 Base) MCG/ACT inhaler Inhale 2 puffs into the lungs every 6 (six) hours as  needed for wheezing or shortness of breath. 08/27/24   Sofia, Leslie K, [name]  azelastine  (ASTELIN ) 0.1 % nasal spray Place 1 spray into both nostrils 2 (two) times daily. Use in each nostril as directed 10/10/24   Mayer, Jodi R, NP  azelastine  (OPTIVAR ) 0.05 % ophthalmic solution Place 1 drop into both eyes 2 (two) times daily. 10/10/24   Mayer, Jodi R, NP  Blood Pressure KIT Please use to check blood pressure as needed. 07/15/23   Delores Suzann HERO, MD  budesonide -formoterol  (SYMBICORT ) 160-4.5 MCG/ACT inhaler Inhale 2 puffs into the lungs 2 (two) times daily. 09/02/24   Alena Morrison, Reagan, MD  etonogestrel  (NEXPLANON ) 68 MG IMPL implant 1 each by Subdermal route once.    [provider]  famotidine  (PEPCID ) 20 MG tablet Take 1 tablet (20 mg total) by mouth 2 (two) times daily. 08/11/24   Jeneal Danita Macintosh, MD  ferrous sulfate  (FEROSUL) 325 (65 FE) MG tablet Take 1 tablet (325 mg total) by mouth every other day. 11/11/24   Baloch, Mahnoor, MD  FLUoxetine  (PROZAC ) 20 MG capsule Take 20 mg by mouth daily. 07/23/24   [provider]  FLUoxetine  (PROZAC ) 20 MG tablet Take 20 mg by mouth daily.    [provider]  fluticasone  (FLONASE ) 50 MCG/ACT nasal spray Place 2 sprays into both nostrils daily. 04/25/24   Sowell, Brandon, MD  fluticasone -salmeterol (ADVAIR HFA) 115-21 MCG/ACT inhaler Inhale 2 puffs into the lungs 2 (two) times daily. 04/12/24   Kara Dorn NOVAK, MD  hydrocortisone  2.5 % cream Apply topically 2 (two) times daily. 06/01/24   Enedelia Dorna HERO, FNP  hydroxypropyl methylcellulose / hypromellose (ISOPTO TEARS / GONIOVISC) 2.5 % ophthalmic solution Place 1 drop into both eyes 4 (four) times daily as needed for dry eyes. 10/24/23   Raspet, Erin K, [name]  hydrOXYzine  (VISTARIL ) 50 MG capsule Take 50 mg by mouth 3 (three) times daily. 11/09/24   [provider]  loratadine  (CLARITIN ) 10 MG tablet Take 1 tablet (10 mg total) by mouth 2 (two) times daily.  10/10/24   Mayer, Jodi R, NP  montelukast  (SINGULAIR ) 10 MG tablet TAKE 1 TABLET(10 MG) BY MOUTH AT BEDTIME 11/15/24   Jeneal Danita Macintosh, MD  senna (SENOKOT) 8.6 MG TABS tablet Take 1 tablet (8.6 mg total) by mouth 2 (two) times daily. Patient taking differently: Take 1 tablet by mouth daily as needed. 05/15/23   Macario Dorothyann HERO, MD  triamcinolone  ointment (KENALOG ) 0.1 % Apply 1 Application topically 2 (two) times daily. Patient taking differently: Apply 1 Application topically as needed. 07/04/24   Reddick, Johnathan B, NP  Vonoprazan Fumarate  (VOQUEZNA ) 20 MG TABS Take 20 mg by mouth daily. 11/16/24   Zehr, Jessica D, [name]    Family History Family History  Problem Relation Age of Onset   Diabetes Mother    Hypertension Mother    Obesity Mother    Heart attack Mother 69       Death   Allergic rhinitis Sister    Other Sister        per-diabetic   Diabetes Sister    Esophageal cancer Neg Hx    Colon cancer Neg Hx    Liver disease Neg Hx     Social History Social History[1]   Allergies   Morphine , Ibuprofen , and Morphine  and codeine    Review of Systems Review of Systems  Constitutional:  Positive for chills. Negative for appetite change and fever.  Gastrointestinal:  Positive for abdominal pain and nausea. Negative for diarrhea and vomiting.  Genitourinary:  Negative for dysuria, menstrual problem (LMP 11/30/24), vaginal discharge and vaginal pain.  Musculoskeletal:  Positive for back pain.  All other systems reviewed and are negative.    Physical Exam Triage Vital Signs ED Triage Vitals  Encounter Vitals Group     BP 12/26/24 1923 114/80     Girls Systolic BP Percentile --      Girls Diastolic BP Percentile --      Boys Systolic BP Percentile --      Boys Diastolic BP Percentile --      Pulse Rate 12/26/24 1923 78     Resp 12/26/24 1923 17     Temp 12/26/24 1923 99.3 F (37.4 C)     Temp Source 12/26/24 1923 Oral     SpO2 12/26/24 1923 98 %     Weight  --      Height --      Head Circumference --      Peak Flow --      Pain Score 12/26/24 1920 7     Pain Loc --      Pain Education --      Exclude from Growth Chart --    No data found.  Updated Vital Signs BP 114/80   Pulse 78   Temp 99.3 F (37.4 C) (Oral)   Resp 17   LMP 11/30/2024 (Exact Date)   SpO2 98%   Visual Acuity Right Eye Distance:   Left Eye Distance:   Bilateral Distance:    Right Eye Near:   Left Eye Near:    Bilateral Near:     Physical Exam Vitals reviewed.  Constitutional:      General: She is awake. She is not in acute distress.    Appearance: Normal appearance. She is well-developed. She is obese. She is not ill-appearing, toxic-appearing or diaphoretic.  HENT:     Head: Normocephalic.     Right Ear: Hearing normal.     Left Ear: Hearing normal.     Nose: Nose normal.     Mouth/Throat:     Mouth: Mucous membranes are moist.  Eyes:     General: Vision grossly intact.     Conjunctiva/sclera: Conjunctivae normal.  Cardiovascular:     Rate and Rhythm: Normal rate and regular rhythm.     Heart sounds: Normal heart sounds.  Pulmonary:     Effort: Pulmonary effort is normal.     Breath sounds: Normal breath sounds and air entry.  Abdominal:     General: Bowel sounds are decreased. There is no distension.     Palpations: Abdomen is soft.     Tenderness: There is abdominal tenderness in the periumbilical area. There is no right CVA tenderness, left CVA tenderness,  guarding or rebound.  Musculoskeletal:        General: Normal range of motion.     Cervical back: Normal range of motion and neck supple.  Skin:    General: Skin is warm and dry.  Neurological:     General: No focal deficit present.     Mental Status: She is alert and oriented to person, place, and time.  Psychiatric:        Speech: Speech normal.        Behavior: Behavior is cooperative.      UC Treatments / Results  Labs (all labs ordered are listed, but only abnormal  results are displayed) Labs Reviewed  POCT URINE DIPSTICK - Abnormal; Notable for the following components:      Result Value   Clarity, UA cloudy (*)    Ketones, POC UA moderate (40) (*)    All other components within normal limits  POCT URINE PREGNANCY    EKG   Radiology No results found.  Procedures Procedures (including critical care time)  Medications Ordered in UC Medications - No data to display  Initial Impression / Assessment and Plan / UC Course  I have reviewed the triage vital signs and the nursing notes.  Pertinent labs & imaging results that were available during my care of the patient were reviewed by me and considered in my medical decision making (see chart for details).     Patient presents with acute onset abdominal pain that began at approximately 9:00 AM, initially localized to the right lower quadrant and now generalized across the abdomen. The pain is described as deep, cramping, and moderate in intensity, and is associated with nausea and chills. While the patient denied fever initially, a low-grade temperature of 99.31F was noted in the urgent care setting. Physical examination reveals mid-abdominal tenderness with decreased bowel sounds. Urinalysis shows moderate ketones without evidence of urinary infection.  Given the migration of pain, low-grade fever, decreased bowel sounds, and presence of ketones, there is concern for an acute intra-abdominal process, including early bowel obstruction, appendicitis, or other surgical pathology, despite appendicitis being less classic given the current central distribution of pain. Prior cholecystectomy excludes gallbladder-related causes. Due to the inability to definitively rule out serious intra-abdominal pathology in the urgent care setting, further evaluation with advanced imaging and laboratory studies is warranted.  The patient was advised to remain NPO from this point forward. She is hemodynamically stable and  appropriate for transport to the emergency department via private vehicle for further evaluation and management. The patient was advised to seek immediate emergency care for worsening pain, persistent vomiting, fever, inability to pass stool or gas, dizziness, or any other concerning symptoms.  The patient presents with symptoms and exam findings that warrant a higher level of care and further evaluation. Given the clinical picture, emergency department (ED) assessment is recommended for advanced diagnostics and management. The patient is currently stable and appropriate for transport via private vehicle. A responsible adult will be driving. The patient is alert, oriented, demonstrates clear mental status, good insight into their illness, and sound judgment in making decisions regarding their care. The risks, rationale, and need for ED evaluation were discussed, and the patient is agreeable to the plan.  Documentation was completed with the aid of voice recognition software. Transcription may contain typographical errors.  Final Clinical Impressions(s) / UC Diagnoses   Final diagnoses:  Acute abdominal pain  Nausea without vomiting  Ketosis (HCC)  Low grade fever  Discharge Instructions      You were seen today for sudden abdominal pain that started this morning and has spread across your abdomen. You also have nausea, chills, and a low-grade fever. Your exam and urine testing raise concern for a possible abdominal condition that needs further testing, which cannot be safely completed in urgent care. Do not eat or drink anything from this point forward. You are stable to go to the emergency department by private vehicle for further evaluation, including labs and imaging to determine the cause of your pain. Go directly to the emergency department!    [facility] Emergency Department at [facility] [address], [place], [place] [address]     ED Prescriptions    None    PDMP not reviewed this encounter.      [1]  Social History Tobacco Use   Smoking status: Former    Types: Cigarettes   Smokeless tobacco: Never  Vaping Use   Vaping status: Never Used  Substance Use Topics   Alcohol use: Not Currently   Drug use: Not Currently    Comment: last used in teens     Iola Lukes, [place] 12/26/24 2117  "

## 2024-12-26 NOTE — ED Triage Notes (Signed)
 Patient here POV from Home.  Endorses ABD pain since this AM at 0900. Originally banded from Right ABD around to Left ABD and back but is now just in anterior ABD. No Dysuria. Some Nausea. No Emesis. No Diarrhea. No Fever but chills.  Went to UC and sent for evaluation.   NAD noted during triage. A&Ox4. GCS 15. Ambulatory.

## 2024-12-27 ENCOUNTER — Encounter: Payer: Self-pay | Admitting: Student

## 2024-12-27 ENCOUNTER — Ambulatory Visit: Admitting: Student

## 2024-12-27 ENCOUNTER — Other Ambulatory Visit: Payer: Self-pay | Admitting: Allergy

## 2024-12-27 VITALS — BP 103/66 | HR 75 | Temp 98.2°F | Ht 62.0 in | Wt 235.1 lb

## 2024-12-27 DIAGNOSIS — R110 Nausea: Secondary | ICD-10-CM

## 2024-12-27 DIAGNOSIS — J3089 Other allergic rhinitis: Secondary | ICD-10-CM

## 2024-12-27 DIAGNOSIS — H1010 Acute atopic conjunctivitis, unspecified eye: Secondary | ICD-10-CM

## 2024-12-27 DIAGNOSIS — R1084 Generalized abdominal pain: Secondary | ICD-10-CM | POA: Diagnosis present

## 2024-12-27 MED ORDER — ONDANSETRON 4 MG PO TBDP: 4.0000 mg | ORAL_TABLET | Freq: Three times a day (TID) | ORAL | 0 refills | Status: AC | PRN

## 2024-12-27 NOTE — Patient Instructions (Signed)
 It was great to see you! Thank you for allowing me to participate in your care!   I recommend that you always bring your medications to each appointment as this makes it easy to ensure we are on the correct medications and helps us  not miss when refills are needed.  Our plans for today:  - Take 1000 mg of Tylenol  every 6 hours as needed for pain - Take Zofran  4 mg under the tongue every 8 hours as needed for nausea - I recommend discontinuing abdominal workouts for a couple days until your pain is improved, in case this is abdominal wall pain - I recommend calling your GI doctor to be seen sooner, and possibly move up your endoscopy - You can take your Protonix  in addition to famotidine  - You can use ice/heat on the abdomen as well - If you develop fevers with severe pain in your right lower abdomen or around your bellybutton, uncontrollable vomiting or bloody stools I recommend returning to the ED for evaluation  We are checking some labs today, I will call you if they are abnormal will send you a MyChart message or a letter if they are normal.  If you do not hear about your labs in the next 2 weeks please let us  know  Take care and seek immediate care sooner if you develop any concerns. Please remember to show up 15 minutes before your scheduled appointment time!  Gladis Church, DO [facility]

## 2024-12-27 NOTE — Progress Notes (Signed)
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  History of Present Illness Hanah Moultry Cham is a 34 year old female with gastroesophageal reflux disease who presents with abdominal pain.  Abdominal pain - Onset yesterday morning - Crampy in character - Initially localized to one side, then radiated to other areas and the back - Pain severity increased, prompting her to leave work and seek urgent care and emergency department evaluation - No relief with Tylenol  - Avoids ibuprofen  due to worsening of stomach symptoms - No recent abdominal surgeries other than prior cholecystectomy and C-section  Gastroesophageal reflux disease (gerd) - Chronic GERD symptoms - Previously took Voquenza 20 mg daily for two months without relief; discontinued - Currently taking famotidine  twice daily and pantoprazole  20 mg once daily - Endoscopy was planned last week but not completed  Associated gastrointestinal and urinary symptoms - Nausea yesterday, worsened with eating - No diarrhea - No vomiting - No blood in stool - No urinary changes  Imaging and laboratory findings - CT abdomen unremarkable except for tiny fat-containing hernia, described as longstanding - Unremarkable labs in ED yesterday  Physical activity - Increased physical activity with regular gym workouts increasing abdomen work-outs. Did crunches on Sunday and Monday, before pain began.    OBJECTIVE:   BP 103/66   Pulse 75   Temp 98.2 F (36.8 C) (Oral)   Ht 5' 2 (1.575 m)   Wt 235 lb 2 oz (106.7 kg)   LMP 11/30/2024 (Exact Date)   SpO2 98%   BMI 43.00 kg/m    General: NAD, pleasant Cardio: RRR, no MRG.  Respiratory: CTAB, normal wob on RA GI: Abdomen is soft, not tender, not distended.  Unable to palpate ventral hernia.  BS present.  Carnett's sign is positive today. Skin: Warm and  dry  ASSESSMENT/PLAN:   Assessment & Plan Generalized abdominal pain - Well-appearing, afebrile with exam.  Positive Carnett's sign. - Longstanding GI discomfort 2/2 severe GERD followed by GI - Differential: Gastritis, PUD, abdominal wall pain.  Low concern for hernia as cause of pain (diffuse abdominal pain), small/hernia on CT however no evidence of incarceration on exam. - Recommend taking Protonix , continuing famotidine  - Recommend follow-up with GI - See AVS for additional supportive care measures - ED/return precautions discussed Nausea -Zofran  4 mg ODT every 8 hours as needed for nausea  Gladis Church, DO [facility]  "

## 2024-12-29 ENCOUNTER — Ambulatory Visit: Admitting: Allergy

## 2024-12-29 ENCOUNTER — Ambulatory Visit: Payer: Self-pay

## 2024-12-29 VITALS — BP 112/72 | HR 73 | Ht 62.0 in | Wt 241.4 lb

## 2024-12-29 DIAGNOSIS — Z6841 Body Mass Index (BMI) 40.0 and over, adult: Secondary | ICD-10-CM | POA: Diagnosis not present

## 2024-12-29 DIAGNOSIS — R5383 Other fatigue: Secondary | ICD-10-CM | POA: Diagnosis present

## 2024-12-29 LAB — POCT GLYCOSYLATED HEMOGLOBIN (HGB A1C): Hemoglobin A1C: 5.2 % (ref 4.0–5.6)

## 2024-12-29 MED ORDER — CHOLECALCIFEROL 1.25 MG (50000 UT) PO TABS: 1.0000 | ORAL_TABLET | ORAL | 0 refills | Status: AC

## 2024-12-29 NOTE — Assessment & Plan Note (Addendum)
 Has lost ~60 lbs over the last 6 years with lifestyle modifications. Fatigue is affecting regular exercise routine. Lipid panel pending A1C was 5.2 today, at goal

## 2024-12-29 NOTE — Progress Notes (Signed)
 "   SUBJECTIVE:   CHIEF COMPLAINT / HPI:   Fatigue: Feeling more tired which she reports as feeling sleepy, not feeling refreshed in the mornings, tired all day. Ongoing for the last few months. Hx of low blood in the past. No new medications, has been stable on prozac  for about 1 year. In therapy which she reports helps. Denies changes with abdominal pain. Has intermittent headaches that last a few hours, tylenol  and rest help. Lives at home with two kids, had adenoids and tonsils removed as a kid.  AM headaches every now and then, has been told that she snores. Had a sleep study done in 06/08/2022 with no evidence of OSA.  Goes to bed around 9 and does not sleep until around 10:30. Is exercising less due to the fatigue. She denies any history of depression or anhedonia.  Reports regular menstrual periods every month, denies vaginal dryness or itchiness, mood irritability, hot flashes/sweatingdoes report intermittent mild headaches.  PERTINENT  PMH / PSH: GERD, hiatal hernia, moderate persistent asthma, subclinical hypothyroidism, non-intractable headache  OBJECTIVE:   BP 112/72   Pulse 73   Ht 5' 2 (1.575 m)   Wt 241 lb 6 oz (109.5 kg)   LMP 11/30/2024 (Exact Date)   SpO2 99%   BMI 44.15 kg/m    Cardiac: Regular rate and rhythm. Normal S1/S2. No murmurs, rubs, or gallops appreciated. Lungs: Clear bilaterally to ascultation.  Abdomen: Obese abdomen, normoactive bowel sounds.  Mild tenderness to epigastrium to deep palpation, otherwise no tenderness to deep or light palpation. No rebound or guarding.   Psych: Pleasant and appropriate      12/29/2024    2:53 PM 12/27/2024   10:33 AM 09/02/2024    4:10 PM 06/16/2024    1:56 PM 06/02/2024    3:24 PM  Depression screen PHQ 2/9  Decreased Interest 0  0 0 0  Down, Depressed, Hopeless 0 0 0 0 0  PHQ - 2 Score 0 0 0 0 0  Altered sleeping 1 1 1 1 1   Tired, decreased energy 0 0 0 0 0  Change in appetite 1 1 1  0 1  Feeling bad or  failure about yourself  0 0  1 0  Trouble concentrating 0 0 0 0 0  Moving slowly or fidgety/restless 0 0 0 0 0  Suicidal thoughts 0 0 0 0 0  PHQ-9 Score 2 2 2  2  2    Difficult doing work/chores Not difficult at all Not difficult at all Not difficult at all       Data saved with a previous flowsheet row definition     ASSESSMENT/PLAN:   Assessment & Plan Fatigue, unspecified type Chronic ongoing issue, with history of reported anemia.  Blood work unremarkable from earlier this month and 8 months ago.  Low concern for vitamin B12 or folate deficiency given normal MCV, but will check folate because it was not collected previously. Vitamin D  level was borderline low 8 months ago which may have been due to multivitamin supplementation, will repeat today due to persistent symptoms and initiate supplementation. Sleep study done ~2 years ago with no OSA. Suspect much of her symptoms may be due lifestyle changes. PHQ-9 2 today with low concern for depression per history. Change exercise routines from every evening prior to bed to the mornings Improve sleep hygiene targeting not lying awake in bed, reading before bed, no screens 2 hours before bed, and avoid other activities in bed. Folate and vitamin  D level pending Follow-up in 2 to 3 months BMI 40.0-44.9, adult (HCC) Has lost ~60 lbs over the last 6 years with lifestyle modifications. Fatigue is affecting regular exercise routine. Lipid panel pending A1C was 5.2 today, at goal     Fairy Amy, MD [facility] "

## 2024-12-29 NOTE — Assessment & Plan Note (Addendum)
 Chronic ongoing issue, with history of reported anemia.  Blood work unremarkable from earlier this month and 8 months ago.  Low concern for vitamin B12 or folate deficiency given normal MCV, but will check folate because it was not collected previously. Vitamin D  level was borderline low 8 months ago which may have been due to multivitamin supplementation, will repeat today due to persistent symptoms and initiate supplementation. Sleep study done ~2 years ago with no OSA. Suspect much of her symptoms may be due lifestyle changes. PHQ-9 2 today with low concern for depression per history. Change exercise routines from every evening prior to bed to the mornings Improve sleep hygiene targeting not lying awake in bed, reading before bed, no screens 2 hours before bed, and avoid other activities in bed. Folate and vitamin D  level pending Follow-up in 2 to 3 months

## 2024-12-29 NOTE — Patient Instructions (Addendum)
 Thank you for visiting the clinic today, it was good to see you!  Please always bring your medication bottles  In today's visit we discussed:  Fatigue: We discussed many important lifestyle changes to make to try to boost her energy throughout the day.  Some of the most important include starting a day with the exercise, and avoiding exercise within 2 to 3 hours of trying to go to bed.  Improving your sleep hygiene, please see the attached information below.  Additionally you can try taking a vitamin D  supplement as prescribed as this may increase your energy level.  Health maintenance: We got a few extra labs today to screen for other causes of your fatigue as well as check your cholesterol and A1c.  Please follow-up in 2-3 months  For any questions, please call the office at [phone] or send me a message in MyChart. Have a great day!  -Fairy Amy, MD  [facility] Resident, PGY-1   What is sleep hygiene? Sleep hygiene is the term doctors use for habits that help you get good-quality sleep. Good-quality sleep means getting sleep that is long enough and is restful. The things we do throughout the day and before bed can affect how well we sleep. Sometimes, we do things that might make our sleep worse without realizing it. Good sleep hygiene is about understanding how to get better sleep.  Why is sleep important?  You need sleep to feel awake during the day and to be alert enough to do your normal activities. It's also important for keeping your body healthy. For example, sleep: ?Helps you learn information and form memories - As you sleep, the brain processes and stores information and experiences from when you were awake. ?Can help lower your risk of getting sick, or help you get better faster if you are sick ?Helps babies and children grow - This is because the body releases more growth hormone during sleep than during the day. ?Helps children and adults build  muscles and repair cells and tissues in the body Not getting enough sleep can have negative effects. These can include: ?Feeling irritable, anxious, or depressed ?Relationship problems, especially for children and teens ?An increased risk of high blood pressure, heart disease, stroke, kidney disease, obesity, and type 2 diabetes  How much sleep do I need?  It depends on your age, lifestyle, and general health. Your doctor or nurse can help you understand exactly how much sleep you or your child needs. The general recommendations for sleep are: ?Newborns (0 to 3 months) - 14 to 17 hours a day ?Infants (4 to 11 months) - 12 to 15 hours a day ?Toddlers (1 to 2 years) - 11 to 14 hours a day ?Preschool-aged children (3 to 5 years) - 10 to 13 hours a day ?School-aged children (6 to 13 years) - 9 to 11 hours a day ?Teens (14 to 17 years) - 8 to 10 hours a day ?Adults (26 to 64 years) - 7 to 9 hours a day ?Older adults (65 years or older) - 7 to 8 hours a day  If you do not get enough sleep each night, you build up a sleep debt. For example, if a school-aged child gets 8 hours of sleep instead of 10, they have a sleep debt of 2 hours. People with a sleep debt might need more than the recommended number of hours of sleep until they catch up. Catching up on sleep can be difficult. The healthiest thing  to do is to sleep the recommended number of hours each night. But if this is not possible because of your job or other responsibilities, try to catch up on sleep when you can. Remember that the length of sleep is not the only thing that makes for good sleep. The quality of sleep is just as important as the number of hours. This is because your brain and body move through sleep cycles as you sleep. Each cycle has a different purpose. If you do not sleep deeply, or if you wake up often throughout the night, you might not get enough time in each sleep cycle.  How can I get better sleep?  Having good sleep  hygiene means that you: ?Go to bed and get up at about the same time every day. ?Have coffee, tea, and other drinks or foods with caffeine only in the morning. ?Avoid eating close to bedtime - Try not to eat a large meal soon before you go to bed. It is better to eat a healthy and filling (but not too heavy) meal in the early evening. Try to avoid late-night snacking. ?Avoid alcohol in the late afternoon, evening, and bedtime. ?Avoid smoking, especially in the evening. ?Keep your bedroom dark, cool, quiet, and free of reminders of work or other things that cause you stress. ?Try to solve problems before you go to bed. ?Get plenty of physical activity, but not right before bed - Exercising 4 to 6 hours before bedtime has the best impact on sleep. ?Avoid looking at phones or reading devices (e-books) that give off light before bed - This can make it harder to fall asleep. There is not good evidence that wearing special blue light-filtering glasses works to improve sleep. ?Keep the place where you sleep quiet and dark - If needed, you can use a white noise machine or ear plugs to block out sound. Blackout curtains or an eye mask can help block out light. ?Do not check the time at night - Try to keep alarm clocks, watches, or smartphones out of your line of sight. Checking the time in the middle of the night can make you feel more awake, and can make it hard to fall back asleep. ?Avoid long naps if you have trouble sleeping at night, especially in the late afternoon. Short naps (about 20 minutes) can be helpful, especially if your work schedule changes day to day and you need to be alert at different times.  What if I am having trouble sleeping?  Everyone has a bad night of sleep sometimes. But if you regularly have trouble with sleep, see your doctor or nurse. They can check to see if you have a sleep disorder. Then, they can work with you to treat the problem. Call for advice if you or your  child: ?Regularly do not get the recommended amount of sleep ?Have a lot of trouble waking up in the morning ?Feel tired or have trouble focusing during the day ?Wake up often throughout the night and can't get back to sleep ?Have a lot of trouble falling asleep at night ?Have other problems related to or caused by sleep

## 2024-12-30 LAB — LIPID PANEL
Chol/HDL Ratio: 2.9 ratio (ref 0.0–4.4)
Cholesterol, Total: 123 mg/dL (ref 100–199)
HDL: 43 mg/dL
LDL Chol Calc (NIH): 63 mg/dL (ref 0–99)
Triglycerides: 88 mg/dL (ref 0–149)
VLDL Cholesterol Cal: 17 mg/dL (ref 5–40)

## 2024-12-30 LAB — VITAMIN D 25 HYDROXY (VIT D DEFICIENCY, FRACTURES): Vit D, 25-Hydroxy: 25.1 ng/mL — ABNORMAL LOW (ref 30.0–100.0)

## 2024-12-30 LAB — FOLATE: Folate: >20 ng/mL

## 2024-12-30 NOTE — Progress Notes (Signed)
 Aeroallergen Immunotherapy  Ordering Provider: Danita Brain, MD  Patient Details Name: MIREL HUNDAL MRN: [id number] Date of Birth: 11-11-1991  Order 1 of 2  Vial Label: pollen, pet  0.3 ml (Volume)  BAU Concentration -- 7 Grass Mix* 100,000 ([place]  Blue, [place], Orchard, Perennial Rye, RedTop, Sweet Vernal, Timothy) 0.3 ml (Volume)  BAU Concentration -- [place] 10,000 0.2 ml (Volume)  1:20 Concentration -- [place] 0.3 ml (Volume)  1:20 Concentration -- Ragweed Mix 0.5 ml (Volume)  1:20 Concentration -- Eastern 10 Tree Mix* 0.2 ml (Volume)  1:10 Concentration -- Cedar, red 0.2 ml (Volume)  1:10 Concentration -- Pecan Pollen 0.5 ml (Volume)  1:10 Concentration -- Cat Hair 0.5 ml (Volume)  1:10 Concentration -- Dog Epithelia   3.0  ml Extract Subtotal 2.0  ml Diluent  5.0  ml Maintenance Total  Schedule:  B Green Vial (1:1,000): Schedule B (6 doses) Blue Vial (1:100): Schedule B (6 doses) Yellow Vial (1:10): Schedule B (6 doses) Red Vial (1:1): Schedule A (14 doses)  Special Instructions: After completion of the first Red Vial, please space to every two weeks. After completion of the second Red Vial, please space to every 4 weeks. Ok to up dose new vials on Schedule D. Ok to come twice weekly, if desired, as long as there is 48 hours between injections.

## 2024-12-30 NOTE — Progress Notes (Signed)
 VIALS MADE ON 12/30/24

## 2024-12-30 NOTE — Progress Notes (Signed)
 Aeroallergen Immunotherapy  Ordering Provider: Danita Brain, MD  Patient Details Name: Veronica [name] MRN: [id number] Date of Birth: 05/28/1991  Order 2 of 2  Vial Label: mold, mite, roach  0.2 ml (Volume)  1:20 Concentration -- Alternaria alternata 0.2 ml (Volume)  1:20 Concentration -- Cladosporium herbarum 0.2 ml (Volume)  1:10 Concentration -- Aspergillus mix 0.2 ml (Volume)  1:10 Concentration -- Penicillium mix 0.5 ml (Volume)   AU Concentration -- Mite Mix (DF 5,000 & DP 5,000) 0.3 ml (Volume)  1:20 Concentration -- Cockroach, German   1.6  ml Extract Subtotal 3.4  ml Diluent  5.0  ml Maintenance Total  Schedule:  B Green Vial (1:1,000): Schedule B (6 doses) Blue Vial (1:100): Schedule B (6 doses) Yellow Vial (1:10): Schedule B (6 doses) Red Vial (1:1): Schedule A (14 doses)  Special Instructions: After completion of the first Red Vial, please space to every two weeks. After completion of the second Red Vial, please space to every 4 weeks. Ok to up dose new vials on Schedule D. Ok to come twice weekly, if desired, as long as there is 48 hours between injections.

## 2025-01-05 ENCOUNTER — Institutional Professional Consult (permissible substitution) (INDEPENDENT_AMBULATORY_CARE_PROVIDER_SITE_OTHER): Admitting: Nurse Practitioner

## 2025-01-12 ENCOUNTER — Encounter: Payer: Self-pay | Admitting: Family Medicine

## 2025-01-12 ENCOUNTER — Ambulatory Visit: Admitting: Family Medicine

## 2025-01-12 VITALS — BP 107/76 | HR 86 | Ht 62.0 in | Wt 241.8 lb

## 2025-01-12 DIAGNOSIS — S29012A Strain of muscle and tendon of back wall of thorax, initial encounter: Secondary | ICD-10-CM

## 2025-01-12 MED ORDER — NAPROXEN 500 MG PO TABS: 500.0000 mg | ORAL_TABLET | Freq: Two times a day (BID) | ORAL | 0 refills | Status: AC

## 2025-01-12 MED ORDER — TIZANIDINE HCL 4 MG PO TABS: 4.0000 mg | ORAL_TABLET | Freq: Every evening | ORAL | 1 refills | Status: AC | PRN

## 2025-01-12 NOTE — Patient Instructions (Addendum)
 It was wonderful to see you today! Thank you for choosing [facility].   Please bring ALL of your medications with you to every visit.   Today we talked about:  As we discussed your lifting at work is likely causing your back pain and unfortunately the best fix is to modify your lifting as best you can to reduce the strain on your back.  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  If they need documentation for this please let our office know. For your pain I am giving you 2 different medications to see if we can calm down your symptoms.  You can use the naproxen  twice per day, particularly before work to help with inflammation.  You can also use the muscle relaxer at night when you get off work and you are trying to sleep.  You can also use heat in the evening and topical lidocaine  patches which you can find at the pharmacy.  Please see the information for physical therapy below to consider working with them to help with your pain long-term. You can call physical therapy to set up an appointment with the information below:  [facility] [address], [id number] [phone]  Please follow up in 4 to 6 weeks of persistent symptoms  Call the clinic at [phone] if your symptoms worsen or you have any concerns.  Please be sure to schedule follow up at the front desk before you leave today.   Izetta Nap, DO Family Medicine

## 2025-01-12 NOTE — Progress Notes (Signed)
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 Respiratory: CTAB, normal effort, No wheezes, rales or rhonchi MSK:  Back: -No erythema, ecchymosis or deformity noted -Tender to palpation over right mid axillary region of ribs 8 through 10 -No spinous process tenderness -Full ROM without pain, normal gait Extremities: no edema or cyanosis. Skin: warm and dry, no rashes noted Neuro: alert, no obvious focal deficits Psych: Normal affect and mood  ASSESSMENT/PLAN:   Assessment & Plan Strain of thoracic back region Chronic thoracic back strain from repetitive lifting at work.  Discussed work accommodations, patient will check if assistive devices for increased possible for lifting.  Will trial medication management for pain relief and provided scheduling information for PT. - Trial naproxen  500 mg twice daily x 2 weeks - Trial tizanidine  4 mg nightly - Continue supportive care with heat, topical lidocaine  patches and stretching/core strengthening exercises - Provided physical therapy referral information for further evaluation and exercises.  Dr. Izetta Nap, DO [facility]     "

## 2025-01-26 ENCOUNTER — Ambulatory Visit

## 2025-02-23 ENCOUNTER — Ambulatory Visit: Admitting: Emergency Medicine

## 2025-03-02 ENCOUNTER — Encounter: Admitting: Internal Medicine

## 2025-03-30 ENCOUNTER — Ambulatory Visit: Admitting: Allergy
